# Patient Record
Sex: Male | Born: 1955 | Race: White | Hispanic: No | Marital: Married | Smoking: Never smoker
Health system: Southern US, Community
[De-identification: ages and names within clinical notes are randomized; demographics above are authoritative.]

## PROBLEM LIST (undated history)

## (undated) DIAGNOSIS — J309 Allergic rhinitis, unspecified: Secondary | ICD-10-CM

## (undated) DIAGNOSIS — M109 Gout, unspecified: Secondary | ICD-10-CM

## (undated) DIAGNOSIS — E669 Obesity, unspecified: Secondary | ICD-10-CM

## (undated) DIAGNOSIS — N289 Disorder of kidney and ureter, unspecified: Secondary | ICD-10-CM

## (undated) DIAGNOSIS — I639 Cerebral infarction, unspecified: Secondary | ICD-10-CM

## (undated) DIAGNOSIS — T783XXA Angioneurotic edema, initial encounter: Secondary | ICD-10-CM

## (undated) DIAGNOSIS — R001 Bradycardia, unspecified: Secondary | ICD-10-CM

## (undated) DIAGNOSIS — F319 Bipolar disorder, unspecified: Secondary | ICD-10-CM

## (undated) DIAGNOSIS — I10 Essential (primary) hypertension: Secondary | ICD-10-CM

## (undated) DIAGNOSIS — E119 Type 2 diabetes mellitus without complications: Secondary | ICD-10-CM

## (undated) DIAGNOSIS — L509 Urticaria, unspecified: Secondary | ICD-10-CM

## (undated) DIAGNOSIS — T1491XA Suicide attempt, initial encounter: Secondary | ICD-10-CM

## (undated) DIAGNOSIS — N419 Inflammatory disease of prostate, unspecified: Secondary | ICD-10-CM

## (undated) DIAGNOSIS — N182 Chronic kidney disease, stage 2 (mild): Secondary | ICD-10-CM

## (undated) HISTORY — DX: Allergic rhinitis, unspecified: J30.9

## (undated) HISTORY — DX: Angioneurotic edema, initial encounter: T78.3XXA

## (undated) HISTORY — PX: UMBILICAL HERNIA REPAIR: SHX196

## (undated) HISTORY — PX: TONSILLECTOMY: SUR1361

## (undated) HISTORY — DX: Inflammatory disease of prostate, unspecified: N41.9

## (undated) HISTORY — PX: APPENDECTOMY: SHX54

## (undated) HISTORY — PX: CATARACT EXTRACTION: SUR2

## (undated) HISTORY — DX: Obesity, unspecified: E66.9

## (undated) HISTORY — DX: Disorder of kidney and ureter, unspecified: N28.9

## (undated) HISTORY — DX: Urticaria, unspecified: L50.9

---

## 2003-11-25 ENCOUNTER — Emergency Department (HOSPITAL_COMMUNITY): Admission: EM | Admit: 2003-11-25 | Discharge: 2003-11-25 | Payer: Self-pay | Admitting: Emergency Medicine

## 2005-06-16 ENCOUNTER — Emergency Department (HOSPITAL_COMMUNITY): Admission: EM | Admit: 2005-06-16 | Discharge: 2005-06-16 | Payer: Self-pay | Admitting: Emergency Medicine

## 2010-11-19 DIAGNOSIS — R001 Bradycardia, unspecified: Secondary | ICD-10-CM

## 2010-11-19 HISTORY — DX: Bradycardia, unspecified: R00.1

## 2011-07-30 ENCOUNTER — Inpatient Hospital Stay (HOSPITAL_BASED_OUTPATIENT_CLINIC_OR_DEPARTMENT_OTHER)
Admission: RE | Admit: 2011-07-30 | Discharge: 2011-07-30 | Disposition: A | Discharge status: Home / Self Care | Payer: BC Managed Care – PPO | Source: Ambulatory Visit | Attending: Cardiology | Admitting: Cardiology

## 2011-07-30 DIAGNOSIS — I4949 Other premature depolarization: Secondary | ICD-10-CM | POA: Insufficient documentation

## 2011-07-30 DIAGNOSIS — F319 Bipolar disorder, unspecified: Secondary | ICD-10-CM | POA: Insufficient documentation

## 2011-07-30 DIAGNOSIS — I498 Other specified cardiac arrhythmias: Secondary | ICD-10-CM | POA: Insufficient documentation

## 2011-07-30 DIAGNOSIS — I4589 Other specified conduction disorders: Secondary | ICD-10-CM | POA: Insufficient documentation

## 2011-08-02 NOTE — Cardiovascular Report (Signed)
Edwin [name], Edwin [name]               ACCOUNT NO.:  [id number]  MEDICAL RECORD NO.:  [id number]  LOCATION:                                 FACILITY:  PHYSICIAN:  Jake Bathe, MD           DATE OF BIRTH:  DATE OF PROCEDURE:  07/30/2011 DATE OF DISCHARGE:                           CARDIAC CATHETERIZATION   INDICATIONS:  This is a 55 year old male with bipolar disorder on lithium who has had significant bradycardia, AV dissociation, ventricular ectopy/accelerated idioventricular rhythm in evening hours/nighttime hours.  After discussion with Dr. Graciela Husbands of Electrophysiology and frequent ectopic beats and prolonged QT interval, the cardiac catheterization is taking place to exclude coronary artery disease.  PROCEDURE DETAILS:  Informed consent was obtained.  Risk of stroke, heart attack, death, renal impairment, arterial damage, bleeding were explained to the patient at length.  Questions were answered and alternatives treatments were discussed, and 1% lidocaine was used for local anesthesia after visualization of the femoral head via fluoroscopy was obtained.  Using the modified Seldinger technique, a 4-French sheath was inserted in the right femoral artery without difficulty and a Judkins left #4 catheter and No Torque/Williams right catheter were used to selectively cannulate the coronary arteries and multiple views with hand injection of Omnipaque were obtained.  An angled pigtail was used to cross into the left ventricle and a left ventriculogram in the RAO position utilizing 25 mL of contrast was performed.  Following the procedure, sheath was removed.  He was hemodynamically stable, doing well.  A 2 mg of Versed and 25 mcg of fentanyl were used for conscious sedation.  FINDINGS: 1. Left main artery - short, branches into the LAD as well as     circumflex artery.  No angiographically significant disease. 2. Left anterior descending artery - this vessel gives rise to one  significant diagonal branch.  There is minor luminal irregularity     just after the bifurcation of a mid septal branch but otherwise, no     angiographically significant disease is present. 3. Circumflex artery - two significant obtuse marginal branches.  No     angiographically significant disease is present. 4. Right coronary artery - a large dominant vessel giving rise to PDA     as well as posterolateral branch.  No significant coronary artery     disease.  Left ventriculogram:  Normal left ventricular ejection fraction of 60% with no wall motion abnormality.  No significant mitral regurgitation. Ascending aorta appears normal.  HEMODYNAMICS:  The left ventricular systolic pressure 150 with an end- diastolic pressure of 30 mmHg.  Aortic pressure 150/80 with a mean of 106 mmHg.  Telemetry mostly sinus bradycardia.  Heart rate in the upper 30s noted throughout cardiac catheterization.  IMPRESSION: 1. No angiographically significant coronary artery disease. 2. Normal left ventricular ejection fraction with no mitral     regurgitation, no aortic stenosis, and ejection fraction of 60%.  PLAN:  I will discuss findings with Dr. Jennelle Human, his psychiatrist.  After discussing with Dr. Graciela Husbands of Electrophysiology, it would be advantageous to transition him off of lithium to an agent that would not affect the sinoatrial node.  I  will also pursue sleep study.  We will see him back in followup in 1 week postcatheterization.     Jake Bathe, MD     MCS/MEDQ  D:  07/30/2011  T:  07/30/2011  Job:  [id number]  cc:   Jetty Duhamel., M.D.  Electronically Signed by Donato Schultz MD on 08/02/2011 06:16:04 AM

## 2013-10-03 ENCOUNTER — Encounter (HOSPITAL_COMMUNITY): Payer: Self-pay | Admitting: Emergency Medicine

## 2013-10-03 ENCOUNTER — Emergency Department (HOSPITAL_COMMUNITY)
Admission: EM | Admit: 2013-10-03 | Discharge: 2013-10-03 | Disposition: A | Discharge status: Home / Self Care | Payer: BC Managed Care – PPO | Attending: Emergency Medicine | Admitting: Emergency Medicine

## 2013-10-03 DIAGNOSIS — F319 Bipolar disorder, unspecified: Secondary | ICD-10-CM | POA: Insufficient documentation

## 2013-10-03 DIAGNOSIS — Y9389 Activity, other specified: Secondary | ICD-10-CM | POA: Insufficient documentation

## 2013-10-03 DIAGNOSIS — W64XXXA Exposure to other animate mechanical forces, initial encounter: Secondary | ICD-10-CM | POA: Insufficient documentation

## 2013-10-03 DIAGNOSIS — S0081XA Abrasion of other part of head, initial encounter: Secondary | ICD-10-CM

## 2013-10-03 DIAGNOSIS — I10 Essential (primary) hypertension: Secondary | ICD-10-CM | POA: Insufficient documentation

## 2013-10-03 DIAGNOSIS — M109 Gout, unspecified: Secondary | ICD-10-CM | POA: Insufficient documentation

## 2013-10-03 DIAGNOSIS — Y92410 Unspecified street and highway as the place of occurrence of the external cause: Secondary | ICD-10-CM | POA: Insufficient documentation

## 2013-10-03 DIAGNOSIS — Z23 Encounter for immunization: Secondary | ICD-10-CM | POA: Insufficient documentation

## 2013-10-03 DIAGNOSIS — IMO0002 Reserved for concepts with insufficient information to code with codable children: Secondary | ICD-10-CM | POA: Insufficient documentation

## 2013-10-03 HISTORY — DX: Gout, unspecified: M10.9

## 2013-10-03 HISTORY — DX: Bipolar disorder, unspecified: F31.9

## 2013-10-03 HISTORY — DX: Essential (primary) hypertension: I10

## 2013-10-03 MED ORDER — TETANUS-DIPHTH-ACELL PERTUSSIS 5-2.5-18.5 LF-MCG/0.5 IM SUSP
0.5000 mL | Freq: Once | INTRAMUSCULAR | Status: AC
  Administered 2013-10-03: 0.5 mL via INTRAMUSCULAR
  Filled 2013-10-03: qty 0.5

## 2013-10-03 NOTE — ED Provider Notes (Signed)
Medical screening examination/treatment/procedure(s) were performed by non-physician practitioner and as supervising physician I was immediately available for consultation/collaboration.    Celene Kras, MD 10/03/13 [phone]
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
## 2013-11-04 ENCOUNTER — Ambulatory Visit: Payer: BC Managed Care – PPO | Admitting: Cardiology

## 2013-11-15 ENCOUNTER — Encounter: Payer: Self-pay | Admitting: Cardiology

## 2013-11-23 ENCOUNTER — Encounter: Payer: Self-pay | Admitting: Cardiology

## 2013-11-23 ENCOUNTER — Ambulatory Visit (INDEPENDENT_AMBULATORY_CARE_PROVIDER_SITE_OTHER): Payer: BC Managed Care – PPO | Admitting: Cardiology

## 2013-11-23 VITALS — BP 124/86 | HR 48 | Ht 71.0 in | Wt 193.0 lb

## 2013-11-23 DIAGNOSIS — I10 Essential (primary) hypertension: Secondary | ICD-10-CM | POA: Insufficient documentation

## 2013-11-23 DIAGNOSIS — I4589 Other specified conduction disorders: Secondary | ICD-10-CM

## 2013-11-23 NOTE — Patient Instructions (Addendum)
Your physician recommends that you continue on your current medications as directed. Please refer to the Current Medication list given to you today.  Your physician wants you to follow-up in: 1 year with Dr. Skains. You will receive a reminder letter in the mail two months in advance. If you don't receive a letter, please call our office to schedule the follow-up appointment.  

## 2013-11-23 NOTE — Progress Notes (Signed)
[address] [place], [place]  [address] Phone: [phone] Fax:  [phone]  Date:  11/23/2013   ID:  Edwin [name], DOB 1956/01/07, MRN [id number]  PCP:  [name], MD   History of Present Illness: Edwin [name] is a 58 y.o. 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 Diltiazem, which was used for high blood pressure, was discontinued and he continued to have slow heart rates. A cardiac catheterization was performed in 2012 to exclude ischemic origin for his A-V dissociation. This was reassuring. EF was normal.  Currently, he is feeling very well on Depakote. He feels more spontaneous. He feels better than he did on the lithium. I'm very pleased with this. Dr. Jennelle Human must be also.  He's not having any dizziness, syncope. His heart rate today is in the 50s to 70s.   blood pressures overall well-controlled.  Wt Readings from Last 3 Encounters:  11/23/13 193 lb (87.544 kg)  10/03/13 191 lb (86.637 kg)     Past Medical History  Diagnosis Date  . Hypertension   . Bipolar disorder   . Gout   . Allergic rhinitis   . Mild renal insufficiency     , with creatinine of 1.3 in 2010  . Obesity     ,moderate  . Prostatitis     , Episodic  . Internal hemorrhoid     Past Surgical History  Procedure Laterality Date  . Appendectomy    . Tonsillectomy    . Abdominal surgery    . Umbilical hernia repair    . Cataract extraction Right     Current Outpatient Prescriptions  Medication Sig Dispense Refill  . amLODipine (NORVASC) 5 MG tablet Take 5 mg by mouth daily.       . Cholecalciferol (VITAMIN D3) 2000 UNITS TABS Take 1 tablet by mouth daily.      [name] COLCRYS 0.6 MG tablet Take 0.6 mg by mouth 2 (two) times daily.       . divalproex (DEPAKOTE ER) 500 MG 24 hr tablet Take 1,000 mg by mouth at bedtime.       . DULoxetine (CYMBALTA) 60 MG capsule Take 60 mg by mouth 2 (two)  times daily.       . indomethacin (INDOCIN) 50 MG capsule Take 50 mg by mouth as needed.      . lamoTRIgine (LAMICTAL) 150 MG tablet Take 150 mg by mouth daily.       . LevOCARNitine (L-CARNITINE) 250 MG TABS Take 2 tablets by mouth 2 (two) times daily.      [name] losartan (COZAAR) 100 MG tablet Take 100 mg by mouth daily.       [name] ULORIC 80 MG TABS Take 80 mg by mouth daily.        No current facility-administered medications for this visit.    Allergies:    Allergies  Allergen Reactions  . Allopurinol Other (See Comments)    emotionally labile  . Penicillins Hives  . Ciprofloxacin Rash    Social History:  The patient  reports that he has never smoked. He does not have any smokeless tobacco history on file. He reports that he does not drink alcohol or use illicit drugs.   ROS:  Please see the history of present illness.   No syncope, no bleeding, no orthopnea, no PND    PHYSICAL EXAM: VS:  BP 124/86  Pulse 48  Ht 5\' 11"  (1.803 m)  Wt 193 lb (87.544 kg)  BMI 26.93 kg/m2 Well nourished, well developed, in no acute distress HEENT: normal Neck: no JVD Cardiac:  normal S1, S2; RRR/sinus bradycardia; no murmur Lungs:  clear to auscultation bilaterally, no wheezing, rhonchi or rales Abd: soft, nontender, no hepatomegaly Ext: no edema Skin: warm and dry Neuro: no focal abnormalities noted  EKG:  Sinus bradycardia with sinus arrhythmia, heart rate 48 beats per minute. Possible old septal infarct pattern.     ASSESSMENT AND PLAN:  1. Previous A-V dissociation. Doing well. Asymptomatic bradycardia. No changes made in medications. He is feeling much better. Unfortunately his mother in her 5480s has had a stroke and she will be placed on hospice soon. 2. Hypertension-currently well controlled. Amlodipine, Losartan.  Signed, Donato SchultzMark Zaydenn Balaguer, MD [facility]  11/23/2013 11:57 AM

## 2014-03-29 ENCOUNTER — Other Ambulatory Visit: Payer: Self-pay | Admitting: Internal Medicine

## 2014-03-29 DIAGNOSIS — R1030 Lower abdominal pain, unspecified: Secondary | ICD-10-CM

## 2014-03-30 ENCOUNTER — Ambulatory Visit
Admission: RE | Admit: 2014-03-30 | Discharge: 2014-03-30 | Disposition: A | Discharge status: Home / Self Care | Payer: BC Managed Care – PPO | Source: Ambulatory Visit | Attending: Internal Medicine | Admitting: Internal Medicine

## 2014-03-30 DIAGNOSIS — R1030 Lower abdominal pain, unspecified: Secondary | ICD-10-CM

## 2014-03-30 IMAGING — CT CT ABD-PELV W/ CM
2 of 5 series · 16 of 46 positions shown, 18 images · IV contrast (READICAT/WATER & [ID] OMNI 300)
Comparison: None.

ADDENDUM:
The distal -terminal ileum does appear to be decompressed.
CLINICAL DATA: Lower abdomen pain

EXAM:
CT ABDOMEN AND PELVIS WITH CONTRAST
TECHNIQUE: Multidetector CT imaging of the abdomen and pelvis was performed
using the standard protocol following bolus administration of
intravenous contrast.
CONTRAST:  100mL OMNIPAQUE IOHEXOL 300 MG/ML  SOLN

[Series 2: abd/pelvis with · axial · 0.74mm/px · z∈[-397,+23]mm · 13 of 95 slices shown, 15 images]
[im 6/95  soft-tissue]
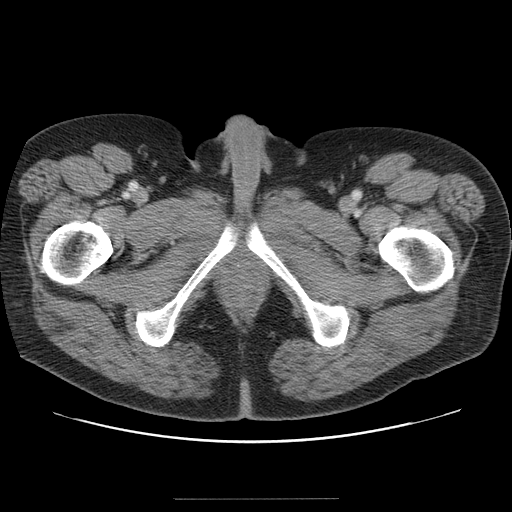
[im 6/95  bone]
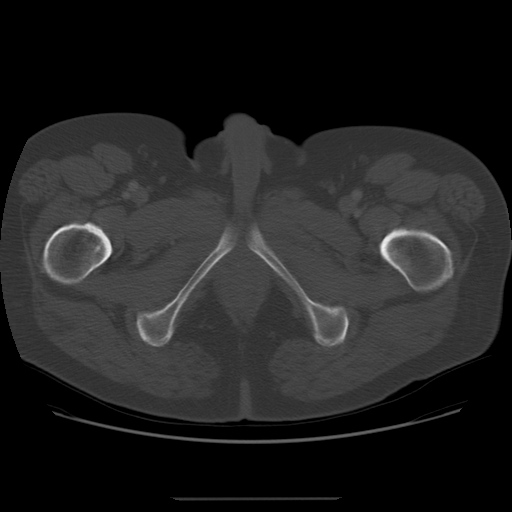
[im 11/95  soft-tissue]
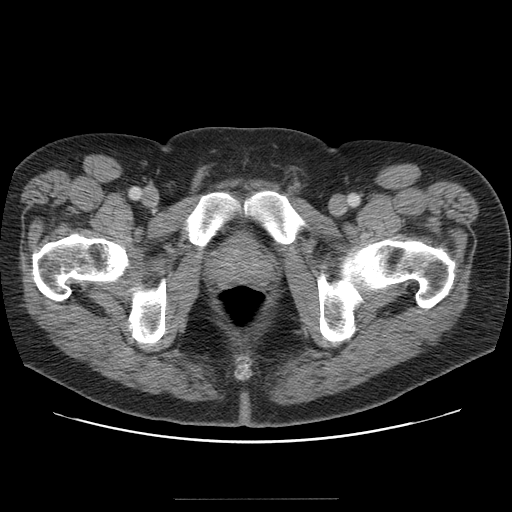
[im 21/95  soft-tissue]
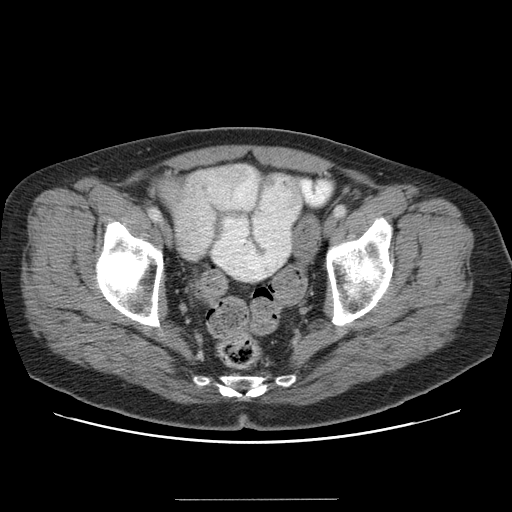
[im 27/95  soft-tissue]
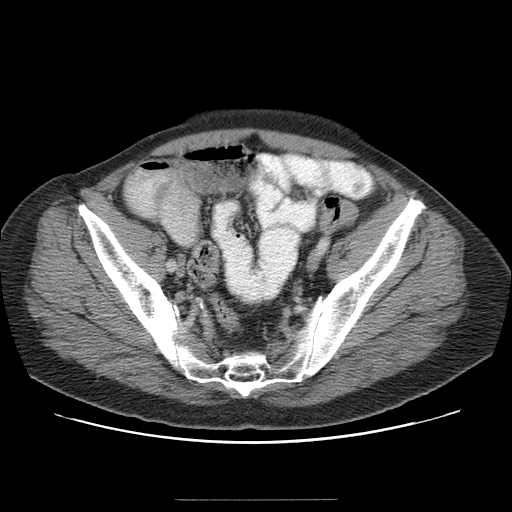
[im 32/95  soft-tissue]
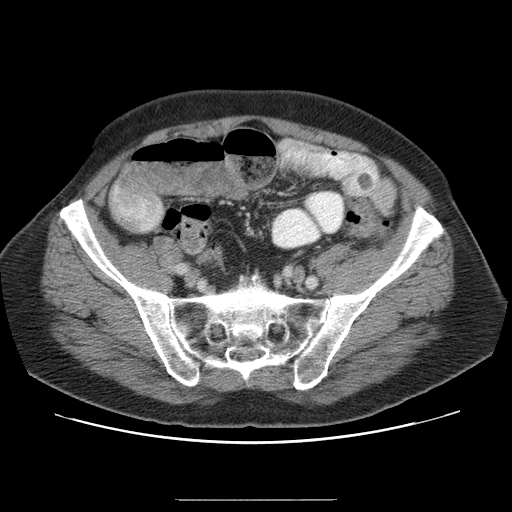
[im 42/95  soft-tissue]
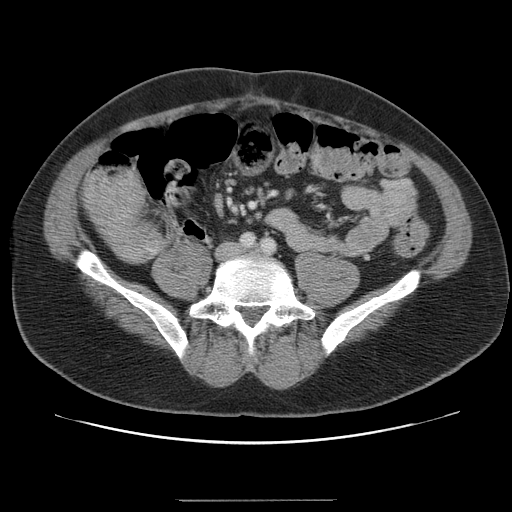
[im 48/95  soft-tissue]
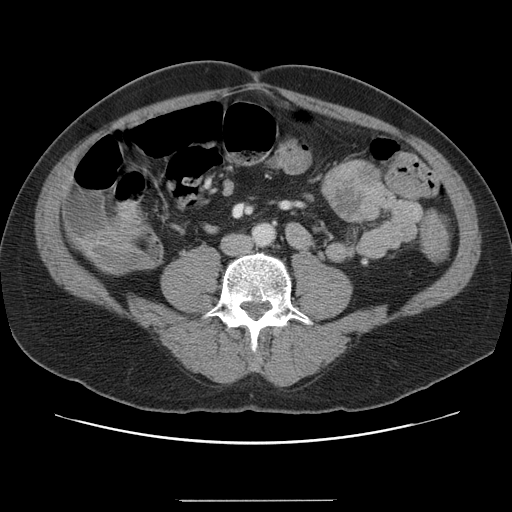
[im 53/95  soft-tissue]
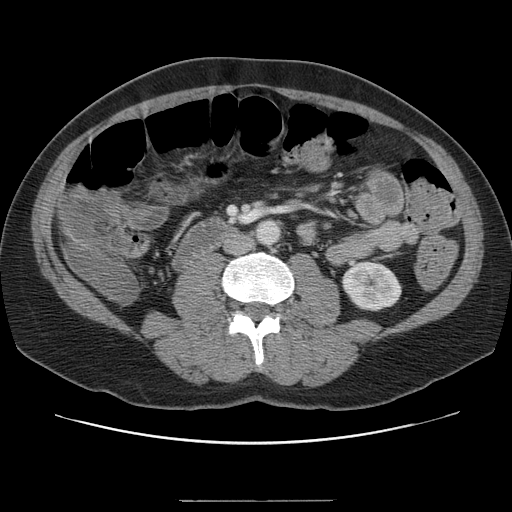
[im 63/95  soft-tissue]
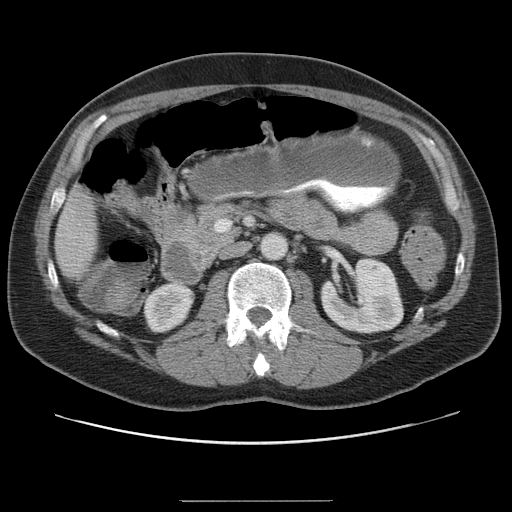
[im 63/95  bone]
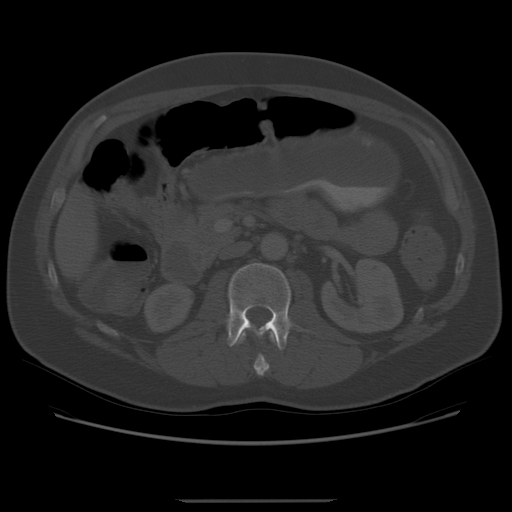
[im 68/95  soft-tissue]
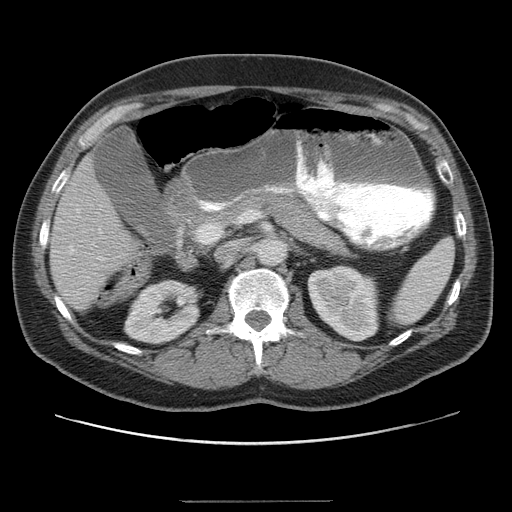
[im 74/95  soft-tissue]
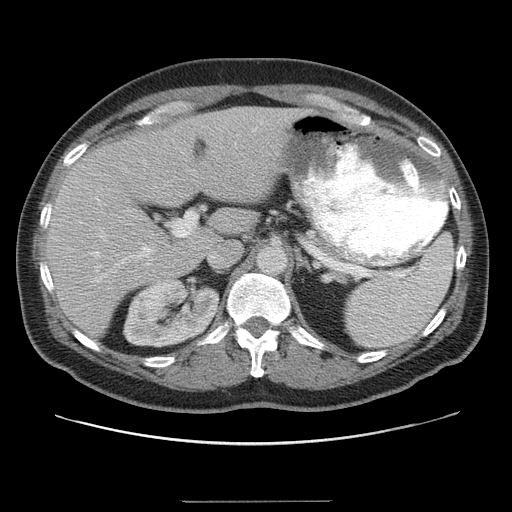
[im 84/95  soft-tissue]
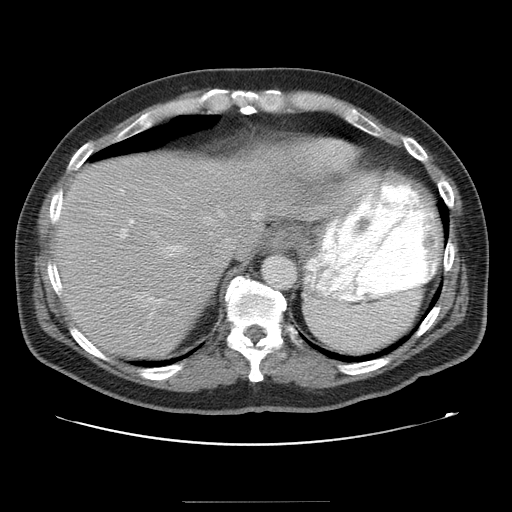
[im 89/95  soft-tissue]
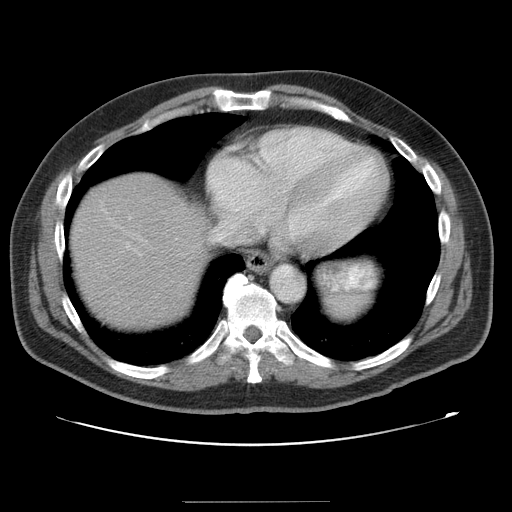

[Series 400: cor · coronal · 0.99mm/px · 3 of 147 slices shown]
[im 49/147  soft-tissue]
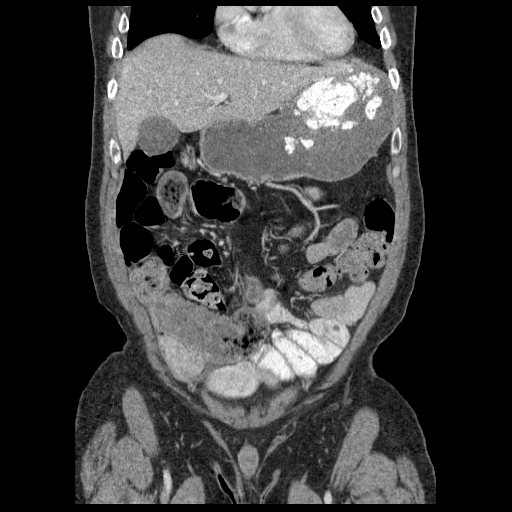
[im 65/147  soft-tissue]
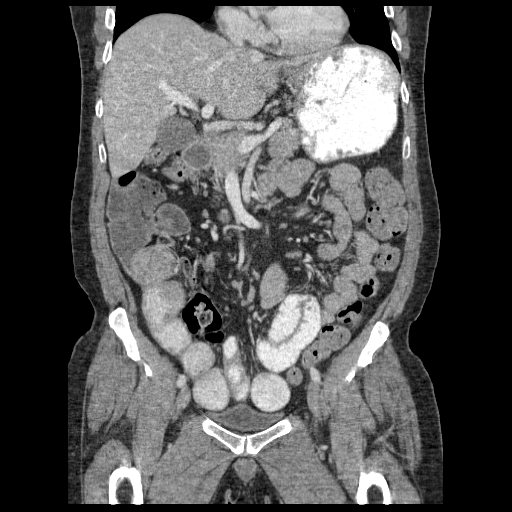
[im 82/147  soft-tissue]
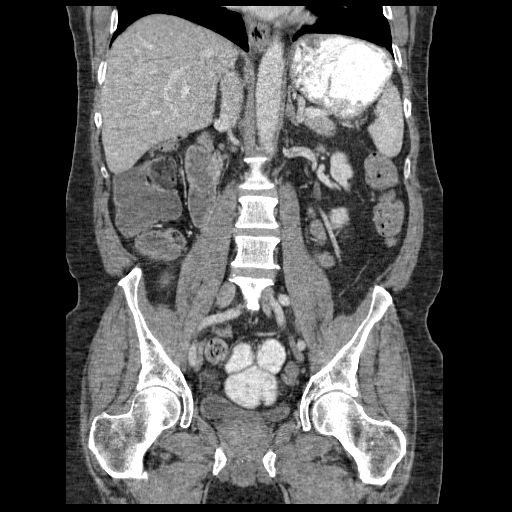

[16 of 46 positions shown; findings below may reference images not displayed]

FINDINGS: The lung bases are clear. The liver enhances with no focal
abnormality and no ductal dilatation is seen. No calcified
gallstones are noted. The pancreas is normal in size and the
pancreatic duct is not dilated. The adrenal glands and spleen are
unremarkable. The stomach is moderately distended with contrast
material and is unremarkable. The kidneys enhance with no calculus
or mass, with a small cyst noted in the posterior left mid kidney.
On delayed images, the pelvocaliceal systems are unremarkable. The
proximal ureters are normal in caliber. The abdominal aorta is
normal caliber with mild atheromatous change present. Only a few
small retroperitoneal nodes are present.

The urinary bladder is decompressed and cannot be evaluated. The
prostate is normal in size. Feces and fluid is noted throughout the
colon. The distal and terminal ileum appears dilated containing
fluid and food debris. This appearance can be seen with partial
small bowel obstruction, and there does appear to be some change in
caliber of small bowel with the more proximal small bowel normal in
caliber and small bowel within the pelvis and right lower quadrant
being dilated. No definite point of obstruction is seen, but an
adhesion would be the primary consideration creating a partial small
bowel obstruction. No free fluid is seen within the pelvis. The
lumbar vertebrae are normal alignment.
IMPRESSION: There does appear to be a change in caliber of the small bowel in
the pelvis where dilated small bowel is present with fecalization.
No definite point of obstruction is seen but adhesion is favored in
this apparent partial small bowel obstruction.

## 2014-03-30 MED ORDER — IOHEXOL 300 MG/ML  SOLN
100.0000 mL | Freq: Once | INTRAMUSCULAR | Status: AC | PRN
  Administered 2014-03-30: 100 mL via INTRAVENOUS

## 2014-03-31 ENCOUNTER — Emergency Department (HOSPITAL_COMMUNITY)
Admission: EM | Admit: 2014-03-31 | Discharge: 2014-03-31 | Disposition: A | Discharge status: Home / Self Care | Payer: BC Managed Care – PPO | Attending: Emergency Medicine | Admitting: Emergency Medicine

## 2014-03-31 ENCOUNTER — Encounter (HOSPITAL_COMMUNITY): Payer: Self-pay | Admitting: Emergency Medicine

## 2014-03-31 DIAGNOSIS — Z87448 Personal history of other diseases of urinary system: Secondary | ICD-10-CM | POA: Insufficient documentation

## 2014-03-31 DIAGNOSIS — E669 Obesity, unspecified: Secondary | ICD-10-CM | POA: Insufficient documentation

## 2014-03-31 DIAGNOSIS — Z79899 Other long term (current) drug therapy: Secondary | ICD-10-CM | POA: Insufficient documentation

## 2014-03-31 DIAGNOSIS — R109 Unspecified abdominal pain: Secondary | ICD-10-CM

## 2014-03-31 DIAGNOSIS — M109 Gout, unspecified: Secondary | ICD-10-CM | POA: Insufficient documentation

## 2014-03-31 DIAGNOSIS — Z9889 Other specified postprocedural states: Secondary | ICD-10-CM | POA: Insufficient documentation

## 2014-03-31 DIAGNOSIS — F319 Bipolar disorder, unspecified: Secondary | ICD-10-CM | POA: Insufficient documentation

## 2014-03-31 DIAGNOSIS — Z880 Allergy status to penicillin: Secondary | ICD-10-CM | POA: Insufficient documentation

## 2014-03-31 DIAGNOSIS — I10 Essential (primary) hypertension: Secondary | ICD-10-CM | POA: Insufficient documentation

## 2014-03-31 DIAGNOSIS — R1084 Generalized abdominal pain: Secondary | ICD-10-CM | POA: Insufficient documentation

## 2014-03-31 DIAGNOSIS — R110 Nausea: Secondary | ICD-10-CM | POA: Insufficient documentation

## 2014-03-31 DIAGNOSIS — Z9089 Acquired absence of other organs: Secondary | ICD-10-CM | POA: Insufficient documentation

## 2014-03-31 NOTE — ED Notes (Signed)
Pt from home, sent for eval of bowel obstruction. PT had CT yesterday at [facility].Sent here this morning by Dr. Eula Listen. Pt states some abd discomfort, last BM 4-5 days ago.

## 2014-04-06 NOTE — ED Provider Notes (Signed)
CSN: [id number]     Arrival date & time 03/31/14  [id number] History   First MD Initiated Contact with Patient 03/31/14 0940     Chief Complaint  Patient presents with  . Abdominal Pain     (Consider location/radiation/quality/duration/timing/severity/associated sxs/prior Treatment) HPI  57yM with abdominal pain and nausea. Onset approximately 4-5 days ago. Diffuse crampy abdominal pain. Associated with nausea. No fevers or chills. No urinary complaints. Patient is CT prior to arrival in the emergency room for further evaluation of symptoms. He was significant for a partial small bowel obstruction. Patient reports that his symptoms have actually been improved today. He has been tolerating liquids.  Past Medical History  Diagnosis Date  . Hypertension   . Bipolar disorder   . Gout   . Allergic rhinitis   . Mild renal insufficiency     , with creatinine of 1.3 in 2010  . Obesity     ,moderate  . Prostatitis     , Episodic  . Internal hemorrhoid    Past Surgical History  Procedure Laterality Date  . Appendectomy    . Tonsillectomy    . Abdominal surgery    . Umbilical hernia repair    . Cataract extraction Right    Family History  Problem Relation Age of Onset  . Other Mother     Viral Meningitis  . Mitral valve prolapse Mother   . Arrhythmia Mother   . Hypothyroidism Mother   . Hypertension Father   . Gout Father   . Alzheimer's disease Father    History  Substance Use Topics  . Smoking status: Never Smoker   . Smokeless tobacco: Not on file  . Alcohol Use: No    Review of Systems   All systems reviewed and negative, other than as noted in HPI.  Allergies  Allopurinol; Penicillins; and Ciprofloxacin  Home Medications   Prior to Admission medications   Medication Sig Start Date End Date Taking? Authorizing Provider  amLODipine (NORVASC) 5 MG tablet Take 2.5 mg by mouth daily.  08/18/13  Yes Historical Provider, MD  Cholecalciferol (VITAMIN D3) 2000 UNITS TABS  Take 2,000 Units by mouth daily.    Yes Historical Provider, MD  COLCRYS 0.6 MG tablet Take 0.6 mg by mouth daily.  09/16/13  Yes Historical Provider, MD  divalproex (DEPAKOTE ER) 500 MG 24 hr tablet Take 1,000 mg by mouth at bedtime.  09/16/13  Yes Historical Provider, MD  DULoxetine (CYMBALTA) 60 MG capsule Take 120 mg by mouth at bedtime.  09/16/13  Yes Historical Provider, MD  lamoTRIgine (LAMICTAL) 150 MG tablet Take 150 mg by mouth daily.  09/16/13  Yes Historical Provider, MD  LevOCARNitine (L-CARNITINE) 250 MG TABS Take 500 mg by mouth 2 (two) times daily.    Yes Historical Provider, MD  losartan (COZAAR) 100 MG tablet Take 50 mg by mouth daily.  09/16/13  Yes Historical Provider, MD  ULORIC 80 MG TABS Take 40 mg by mouth daily.  09/02/13  Yes Historical Provider, MD   BP 130/78  Pulse 60  Temp(Src) 98 F (36.7 C) (Oral)  Resp 14  SpO2 96% Physical Exam  Nursing note and vitals reviewed. Constitutional: He appears well-developed and well-nourished. No distress.  HENT:  Head: Normocephalic and atraumatic.  Eyes: Conjunctivae are normal. Right eye exhibits no discharge. Left eye exhibits no discharge.  Neck: Neck supple.  Cardiovascular: Normal rate, regular rhythm and normal heart sounds.  Exam reveals no gallop and no friction rub.  No murmur heard. Pulmonary/Chest: Effort normal and breath sounds normal. No respiratory distress.  Abdominal: Soft. He exhibits no distension. There is no tenderness. There is no rebound and no guarding.  Musculoskeletal: He exhibits no edema and no tenderness.  Neurological: He is alert.  Skin: Skin is warm and dry.  Psychiatric: He has a normal mood and affect. His behavior is normal. Thought content normal.    ED Course  Procedures (including critical care time) Labs Review Labs Reviewed - No data to display  Imaging Review No results found.  Ct Abdomen Pelvis W Contrast  03/31/2014   ADDENDUM REPORT: 03/31/2014 07:59  ADDENDUM: The  distal -terminal ileum does appear to be decompressed.   Electronically Signed   By: Dwyane Dee M.D.   On: 03/31/2014 07:59   03/31/2014   CLINICAL DATA:  Lower abdomen pain  EXAM: CT ABDOMEN AND PELVIS WITH CONTRAST  TECHNIQUE: Multidetector CT imaging of the abdomen and pelvis was performed using the standard protocol following bolus administration of intravenous contrast.  CONTRAST:  OMNIPAQUE IOHEXOL 300 MG/ML  SOLN  COMPARISON:  None.  FINDINGS: The lung bases are clear. The liver enhances with no focal abnormality and no ductal dilatation is seen. No calcified gallstones are noted. The pancreas is normal in size and the pancreatic duct is not dilated. The adrenal glands and spleen are unremarkable. The stomach is moderately distended with contrast material and is unremarkable. The kidneys enhance with no calculus or mass, with a small cyst noted in the posterior left mid kidney. On delayed images, the pelvocaliceal systems are unremarkable. The proximal ureters are normal in caliber. The abdominal aorta is normal caliber with mild atheromatous change present. Only a few small retroperitoneal nodes are present.  The urinary bladder is decompressed and cannot be evaluated. The prostate is normal in size. Feces and fluid is noted throughout the colon. The distal and terminal ileum appears dilated containing fluid and food debris. This appearance can be seen with partial small bowel obstruction, and there does appear to be some change in caliber of small bowel with the more proximal small bowel normal in caliber and small bowel within the pelvis and right lower quadrant being dilated. No definite point of obstruction is seen, but an adhesion would be the primary consideration creating a partial small bowel obstruction. No free fluid is seen within the pelvis. The lumbar vertebrae are normal alignment.  IMPRESSION: There does appear to be a change in caliber of the small bowel in the pelvis where dilated  small bowel is present with fecalization. No definite point of obstruction is seen but adhesion is favored in this apparent partial small bowel obstruction.  Electronically Signed: By: Dwyane Dee M.D. On: 03/30/2014 17:22    EKG Interpretation None      MDM   Final diagnoses:  Abdominal pain    57yM with imaging significant for possible partial SBO. Clinically though, pt feeling better. Abd soft and NT. No vomiting. Nausea and pain mild. Declining offered pain medication/antiemetic. Tolerating liquids. Slowly advance as diet as tolerated. Return precautions discussed.     Raeford Razor, MD 04/09/14 1043

## 2014-05-19 ENCOUNTER — Other Ambulatory Visit: Payer: Self-pay | Admitting: Gastroenterology

## 2014-06-04 ENCOUNTER — Encounter (HOSPITAL_COMMUNITY): Payer: Self-pay | Admitting: *Deleted

## 2014-06-14 ENCOUNTER — Encounter (HOSPITAL_COMMUNITY)
Admission: RE | Disposition: A | Discharge status: Home / Self Care | Payer: Self-pay | Source: Ambulatory Visit | Attending: Gastroenterology

## 2014-06-14 ENCOUNTER — Ambulatory Visit (HOSPITAL_COMMUNITY)
Admission: RE | Admit: 2014-06-14 | Discharge: 2014-06-14 | Disposition: A | Discharge status: Home / Self Care | Payer: BC Managed Care – PPO | Source: Ambulatory Visit | Attending: Gastroenterology | Admitting: Gastroenterology

## 2014-06-14 ENCOUNTER — Encounter (HOSPITAL_COMMUNITY): Payer: Self-pay

## 2014-06-14 ENCOUNTER — Ambulatory Visit (HOSPITAL_COMMUNITY): Payer: BC Managed Care – PPO | Admitting: Anesthesiology

## 2014-06-14 ENCOUNTER — Encounter (HOSPITAL_COMMUNITY): Payer: BC Managed Care – PPO | Admitting: Anesthesiology

## 2014-06-14 DIAGNOSIS — Z79899 Other long term (current) drug therapy: Secondary | ICD-10-CM | POA: Insufficient documentation

## 2014-06-14 DIAGNOSIS — R109 Unspecified abdominal pain: Secondary | ICD-10-CM | POA: Insufficient documentation

## 2014-06-14 DIAGNOSIS — Q438 Other specified congenital malformations of intestine: Secondary | ICD-10-CM | POA: Insufficient documentation

## 2014-06-14 DIAGNOSIS — I10 Essential (primary) hypertension: Secondary | ICD-10-CM | POA: Insufficient documentation

## 2014-06-14 DIAGNOSIS — F319 Bipolar disorder, unspecified: Secondary | ICD-10-CM | POA: Insufficient documentation

## 2014-06-14 DIAGNOSIS — Z538 Procedure and treatment not carried out for other reasons: Secondary | ICD-10-CM | POA: Insufficient documentation

## 2014-06-14 DIAGNOSIS — M109 Gout, unspecified: Secondary | ICD-10-CM | POA: Insufficient documentation

## 2014-06-14 DIAGNOSIS — Z9089 Acquired absence of other organs: Secondary | ICD-10-CM | POA: Insufficient documentation

## 2014-06-14 HISTORY — PX: COLONOSCOPY WITH PROPOFOL: SHX5780

## 2014-06-14 HISTORY — DX: Bradycardia, unspecified: R00.1

## 2014-06-14 SURGERY — COLONOSCOPY WITH PROPOFOL
Anesthesia: Monitor Anesthesia Care

## 2014-06-14 MED ORDER — LACTATED RINGERS IV SOLN
INTRAVENOUS | Status: DC
  Administered 2014-06-14: 11:00:00 via INTRAVENOUS

## 2014-06-14 MED ORDER — MIDAZOLAM HCL 5 MG/5ML IJ SOLN
INTRAMUSCULAR | Status: DC | PRN
  Administered 2014-06-14 (×2): 1 mg via INTRAVENOUS

## 2014-06-14 MED ORDER — PROPOFOL 10 MG/ML IV BOLUS
INTRAVENOUS | Status: DC | PRN
  Administered 2014-06-14 (×2): 20 mg via INTRAVENOUS
  Administered 2014-06-14 (×2): 40 mg via INTRAVENOUS
  Administered 2014-06-14: 20 mg via INTRAVENOUS

## 2014-06-14 MED ORDER — FENTANYL CITRATE 0.05 MG/ML IJ SOLN
INTRAMUSCULAR | Status: DC | PRN
  Administered 2014-06-14 (×2): 50 ug via INTRAVENOUS

## 2014-06-14 MED ORDER — MIDAZOLAM HCL 2 MG/2ML IJ SOLN
INTRAMUSCULAR | Status: AC
  Filled 2014-06-14: qty 2

## 2014-06-14 MED ORDER — SODIUM CHLORIDE 0.9 % IV SOLN: INTRAVENOUS | Status: DC

## 2014-06-14 MED ORDER — FENTANYL CITRATE 0.05 MG/ML IJ SOLN
INTRAMUSCULAR | Status: AC
  Filled 2014-06-14: qty 2

## 2014-06-14 SURGICAL SUPPLY — 22 items

## 2014-06-14 NOTE — H&P (Signed)
  Problem: Abdominal pain with constipation. Resolved partial small bowel obstruction  History: The patient is a 58 year old male born [birth year]. He underwent an appendectomy in the past but has not undergone a screening colonoscopy in the past.  Approximately 6 weeks ago, the patient was awakened at night with acute nausea, vomiting, abdominal cramps, nonbloody diarrhea. CT scan of the abdomen and pelvis was performed on 03/30/2014 showing mild dilation of the distal-terminal ileum consistent with a partial small bowel obstruction. His gastrointestinal symptoms resolved within 48 hours. He continues to have. Mild discomfort in the lower mid abdomen.  The patient is scheduled to undergo diagnostic colonoscopy.  Past medical history: Gout. Hypertension. Bipolar disorder. Allergic rhinitis. Appendectomy. Right cataract surgery. Umbilical hernia repair.  Allergies: Allopurinol. Penicillin. Ciprofloxacin.  Exam: The patient is alert and lying comfortably on the endoscopy stretcher. Abdomen is soft and nontender to palpation. Lungs are clear to auscultation. Cardiac exam reveals a regular rhythm.  Plan: Proceed with diagnostic colonoscopy

## 2014-06-14 NOTE — Discharge Instructions (Addendum)

## 2014-06-14 NOTE — Op Note (Signed)
Procedure: Incomplete screening colonoscopy to the hepatic flexure  Endoscopist: Danise EdgeMartin Johnson  Premedication: Propofol administered by anesthesia  Procedure: The patient was placed in the left lateral decubitus position. Anal inspection and digital rectal exam were normal. The Pentax pediatric colonoscope was introduced into the rectum and advanced to the hepatic flexure. Due to colonic loop formation I was unable to perform a complete screening colonoscopy. Colon preparation for the exam today was good.  Rectum. Normal. Retroflex view of the distal rectum normal  Sigmoid colon and descending colon. Normal  Splenic flexure. Normal  Transverse colon. Normal  Hepatic flexure. Normal  Ascending colon, cecum, and ileocecal valve. Not examined  Assessment: Normal incomplete screening colonoscopy to the hepatic flexure. The right colon was not exam  Recommendation: Schedule virtual colonoscopy

## 2014-06-14 NOTE — Transfer of Care (Signed)
Immediate Anesthesia Transfer of Care Note  Patient: Edwin [name]  Procedure(s) Performed: Procedure(s): COLONOSCOPY WITH PROPOFOL (N/A)  Patient Location: PACU  Anesthesia Type:MAC  Level of Consciousness: awake, oriented, patient cooperative, lethargic and responds to stimulation  Airway & Oxygen Therapy: Patient Spontanous Breathing and Patient connected to face mask oxygen  Post-op Assessment: Report given to PACU RN, Post -op Vital signs reviewed and stable and Patient moving all extremities  Post vital signs: Reviewed and stable  Complications: No apparent anesthesia complications

## 2014-06-14 NOTE — Anesthesia Preprocedure Evaluation (Addendum)
Anesthesia Evaluation  Patient identified by MRN, date of birth, ID band Patient awake    Reviewed: Allergy & Precautions, H&P , NPO status , Patient's Chart, lab work & pertinent test results  Airway Mallampati: II TM Distance: >3 FB Neck ROM: Full    Dental  (+) Dental Advisory Given   Pulmonary neg pulmonary ROS,  breath sounds clear to auscultation        Cardiovascular hypertension, Pt. on medications Rhythm:Regular Rate:Normal     Neuro/Psych PSYCHIATRIC DISORDERS Bipolar Disorder negative neurological ROS     GI/Hepatic negative GI ROS, Neg liver ROS,   Endo/Other  negative endocrine ROS  Renal/GU Renal InsufficiencyRenal disease     Musculoskeletal negative musculoskeletal ROS (+)   Abdominal   Peds  Hematology negative hematology ROS (+)   Anesthesia Other Findings   Reproductive/Obstetrics                          Anesthesia Physical Anesthesia Plan  ASA: II  Anesthesia Plan: MAC   Post-op Pain Management:    Induction: Intravenous  Airway Management Planned:   Additional Equipment:   Intra-op Plan:   Post-operative Plan:   Informed Consent: I have reviewed the patients History and Physical, chart, labs and discussed the procedure including the risks, benefits and alternatives for the proposed anesthesia with the patient or authorized representative who has indicated his/her understanding and acceptance.   Dental advisory given  Plan Discussed with: CRNA  Anesthesia Plan Comments:         Anesthesia Quick Evaluation

## 2014-06-14 NOTE — Anesthesia Postprocedure Evaluation (Signed)
Anesthesia Post Note  Patient: Edwin [name]  Procedure(s) Performed: Procedure(s) (LRB): COLONOSCOPY WITH PROPOFOL (N/A)  Anesthesia type: MAC  Patient location: PACU  Post pain: Pain level controlled  Post assessment: Post-op Vital signs reviewed  Last Vitals: BP 137/86  Pulse 60  Temp(Src) 36.4 C (Oral)  Resp 12  SpO2 100%  Post vital signs: Reviewed  Level of consciousness: awake  Complications: No apparent anesthesia complications

## 2014-06-15 ENCOUNTER — Encounter (HOSPITAL_COMMUNITY): Payer: Self-pay | Admitting: Gastroenterology

## 2014-09-19 ENCOUNTER — Encounter (HOSPITAL_COMMUNITY): Payer: Self-pay | Admitting: Emergency Medicine

## 2014-09-19 ENCOUNTER — Emergency Department (HOSPITAL_COMMUNITY)
Admission: EM | Admit: 2014-09-19 | Discharge: 2014-09-20 | Disposition: A | Discharge status: Other Acute Inpatient Hospital | Payer: BC Managed Care – PPO | Attending: Emergency Medicine | Admitting: Emergency Medicine

## 2014-09-19 DIAGNOSIS — Z79899 Other long term (current) drug therapy: Secondary | ICD-10-CM | POA: Diagnosis not present

## 2014-09-19 DIAGNOSIS — R45851 Suicidal ideations: Secondary | ICD-10-CM | POA: Diagnosis not present

## 2014-09-19 DIAGNOSIS — T1491XA Suicide attempt, initial encounter: Secondary | ICD-10-CM | POA: Diagnosis present

## 2014-09-19 DIAGNOSIS — E669 Obesity, unspecified: Secondary | ICD-10-CM | POA: Insufficient documentation

## 2014-09-19 DIAGNOSIS — Z8739 Personal history of other diseases of the musculoskeletal system and connective tissue: Secondary | ICD-10-CM | POA: Diagnosis not present

## 2014-09-19 DIAGNOSIS — F314 Bipolar disorder, current episode depressed, severe, without psychotic features: Secondary | ICD-10-CM

## 2014-09-19 DIAGNOSIS — Z8709 Personal history of other diseases of the respiratory system: Secondary | ICD-10-CM | POA: Insufficient documentation

## 2014-09-19 DIAGNOSIS — Z87448 Personal history of other diseases of urinary system: Secondary | ICD-10-CM | POA: Diagnosis not present

## 2014-09-19 DIAGNOSIS — Z880 Allergy status to penicillin: Secondary | ICD-10-CM | POA: Diagnosis not present

## 2014-09-19 DIAGNOSIS — F3132 Bipolar disorder, current episode depressed, moderate: Secondary | ICD-10-CM | POA: Diagnosis present

## 2014-09-19 DIAGNOSIS — I10 Essential (primary) hypertension: Secondary | ICD-10-CM | POA: Diagnosis not present

## 2014-09-19 LAB — CARBOXYHEMOGLOBIN
Carboxyhemoglobin: 1.5 % (ref 0.5–1.5)
Methemoglobin: 2.7 % — ABNORMAL HIGH (ref 0.0–1.5)
O2 Saturation: 49.9 %
Total hemoglobin: 16 g/dL (ref 13.5–18.0)

## 2014-09-19 LAB — CBC WITH DIFFERENTIAL/PLATELET
Basophils Absolute: 0.1 10*3/uL (ref 0.0–0.1)
Basophils Relative: 1 % (ref 0–1)
Eosinophils Absolute: 0.1 10*3/uL (ref 0.0–0.7)
Eosinophils Relative: 1 % (ref 0–5)
HCT: 42.4 % (ref 39.0–52.0)
Hemoglobin: 15.1 g/dL (ref 13.0–17.0)
Lymphocytes Relative: 21 % (ref 12–46)
Lymphs Abs: 1.9 10*3/uL (ref 0.7–4.0)
MCH: 31.5 pg (ref 26.0–34.0)
MCHC: 35.6 g/dL (ref 30.0–36.0)
MCV: 88.3 fL (ref 78.0–100.0)
Monocytes Absolute: 0.8 10*3/uL (ref 0.1–1.0)
Monocytes Relative: 9 % (ref 3–12)
Neutro Abs: 6 10*3/uL (ref 1.7–7.7)
Neutrophils Relative %: 68 % (ref 43–77)
Platelets: 296 10*3/uL (ref 150–400)
RBC: 4.8 MIL/uL (ref 4.22–5.81)
RDW: 12.4 % (ref 11.5–15.5)
WBC: 8.8 10*3/uL (ref 4.0–10.5)

## 2014-09-19 NOTE — ED Notes (Signed)
Pt arrived via EMS on stretcher with report of attempting suicide via [place]. Pt reported that this is not a cry for help. I don't want any help. I don't know how long I was in the car but not long enough. Pt appears depressed and sad. Pt reported that his life is not an option. Pt reported he does not know why he does not want to live.  Pt reported of going through things for 40years and he just tired. Denies visula/audible hallucinations.

## 2014-09-19 NOTE — ED Notes (Signed)
Bed: RESB Expected date: 09/19/14 Expected time: 10:09 PM Means of arrival: Ambulance Comments: Overdose/inhalation

## 2014-09-20 ENCOUNTER — Encounter (HOSPITAL_COMMUNITY): Payer: Self-pay | Admitting: Psychiatry

## 2014-09-20 ENCOUNTER — Inpatient Hospital Stay (HOSPITAL_COMMUNITY)
Admission: AD | Admit: 2014-09-20 | Discharge: 2014-09-23 | DRG: 885 | Disposition: A | Discharge status: Home / Self Care | Payer: Federal, State, Local not specified - Other | Source: Intra-hospital | Attending: Psychiatry | Admitting: Psychiatry

## 2014-09-20 DIAGNOSIS — R45851 Suicidal ideations: Secondary | ICD-10-CM | POA: Diagnosis present

## 2014-09-20 DIAGNOSIS — M109 Gout, unspecified: Secondary | ICD-10-CM | POA: Diagnosis present

## 2014-09-20 DIAGNOSIS — Z609 Problem related to social environment, unspecified: Secondary | ICD-10-CM | POA: Diagnosis present

## 2014-09-20 DIAGNOSIS — T1491XA Suicide attempt, initial encounter: Secondary | ICD-10-CM

## 2014-09-20 DIAGNOSIS — Z820 Family history of epilepsy and other diseases of the nervous system: Secondary | ICD-10-CM

## 2014-09-20 DIAGNOSIS — F314 Bipolar disorder, current episode depressed, severe, without psychotic features: Secondary | ICD-10-CM | POA: Diagnosis present

## 2014-09-20 DIAGNOSIS — F3132 Bipolar disorder, current episode depressed, moderate: Secondary | ICD-10-CM | POA: Diagnosis present

## 2014-09-20 DIAGNOSIS — Z8249 Family history of ischemic heart disease and other diseases of the circulatory system: Secondary | ICD-10-CM | POA: Diagnosis not present

## 2014-09-20 DIAGNOSIS — I10 Essential (primary) hypertension: Secondary | ICD-10-CM | POA: Diagnosis present

## 2014-09-20 DIAGNOSIS — F3130 Bipolar disorder, current episode depressed, mild or moderate severity, unspecified: Secondary | ICD-10-CM

## 2014-09-20 HISTORY — DX: Suicide attempt, initial encounter: T14.91XA

## 2014-09-20 LAB — COMPREHENSIVE METABOLIC PANEL
ALT: 30 U/L (ref 0–53)
AST: 30 U/L (ref 0–37)
Albumin: 4.9 g/dL (ref 3.5–5.2)
Alkaline Phosphatase: 77 U/L (ref 39–117)
Anion gap: 19 — ABNORMAL HIGH (ref 5–15)
BUN: 19 mg/dL (ref 6–23)
CO2: 21 mEq/L (ref 19–32)
Calcium: 10.4 mg/dL (ref 8.4–10.5)
Chloride: 102 mEq/L (ref 96–112)
Creatinine, Ser: 1.23 mg/dL (ref 0.50–1.35)
GFR calc Af Amer: 74 mL/min — ABNORMAL LOW (ref >90)
GFR calc non Af Amer: 64 mL/min — ABNORMAL LOW (ref >90)
Glucose, Bld: 109 mg/dL — ABNORMAL HIGH (ref 70–99)
Potassium: 4.2 mEq/L (ref 3.7–5.3)
Sodium: 142 mEq/L (ref 137–147)
Total Bilirubin: 0.5 mg/dL (ref 0.3–1.2)
Total Protein: 8.1 g/dL (ref 6.0–8.3)

## 2014-09-20 LAB — URINALYSIS, ROUTINE W REFLEX MICROSCOPIC
Bilirubin Urine: NEGATIVE
Glucose, UA: NEGATIVE mg/dL
Hgb urine dipstick: NEGATIVE
Ketones, ur: NEGATIVE mg/dL
Leukocytes, UA: NEGATIVE
Nitrite: NEGATIVE
Protein, ur: NEGATIVE mg/dL
Specific Gravity, Urine: 1.008 (ref 1.005–1.030)
Urobilinogen, UA: 0.2 mg/dL (ref 0.0–1.0)
pH: 7.5 (ref 5.0–8.0)

## 2014-09-20 LAB — RAPID URINE DRUG SCREEN, HOSP PERFORMED
Amphetamines: NOT DETECTED
Barbiturates: NOT DETECTED
Benzodiazepines: NOT DETECTED
Cocaine: NOT DETECTED
Opiates: NOT DETECTED
Tetrahydrocannabinol: NOT DETECTED

## 2014-09-20 LAB — ACETAMINOPHEN LEVEL: Acetaminophen (Tylenol), Serum: <15 ug/mL (ref 10–30)

## 2014-09-20 LAB — SALICYLATE LEVEL: Salicylate Lvl: <2 mg/dL — ABNORMAL LOW (ref 2.8–20.0)

## 2014-09-20 MED ORDER — LOSARTAN POTASSIUM 50 MG PO TABS
50.0000 mg | ORAL_TABLET | Freq: Every day | ORAL | Status: DC
  Administered 2014-09-20: 50 mg via ORAL
  Filled 2014-09-20 (×2): qty 1

## 2014-09-20 MED ORDER — AMLODIPINE BESYLATE 2.5 MG PO TABS
2.5000 mg | ORAL_TABLET | Freq: Every day | ORAL | Status: DC
  Administered 2014-09-20: 2.5 mg via ORAL
  Filled 2014-09-20 (×2): qty 1

## 2014-09-20 MED ORDER — DULOXETINE HCL 60 MG PO CPEP
120.0000 mg | ORAL_CAPSULE | Freq: Every day | ORAL | Status: DC
  Administered 2014-09-20 – 2014-09-22 (×3): 120 mg via ORAL
  Filled 2014-09-20: qty 28
  Filled 2014-09-20 (×5): qty 2

## 2014-09-20 MED ORDER — TRAZODONE HCL 50 MG PO TABS
50.0000 mg | ORAL_TABLET | Freq: Every evening | ORAL | Status: DC | PRN
  Administered 2014-09-20 – 2014-09-21 (×2): 50 mg via ORAL
  Filled 2014-09-20 (×2): qty 1

## 2014-09-20 MED ORDER — AMLODIPINE BESYLATE 2.5 MG PO TABS
2.5000 mg | ORAL_TABLET | Freq: Every day | ORAL | Status: DC
  Filled 2014-09-20 (×2): qty 1

## 2014-09-20 MED ORDER — FEBUXOSTAT 40 MG PO TABS
40.0000 mg | ORAL_TABLET | Freq: Every day | ORAL | Status: DC
  Administered 2014-09-21 – 2014-09-23 (×3): 40 mg via ORAL
  Filled 2014-09-20 (×5): qty 1

## 2014-09-20 MED ORDER — ONDANSETRON HCL 4 MG PO TABS: 4.0000 mg | ORAL_TABLET | Freq: Three times a day (TID) | ORAL | Status: DC | PRN

## 2014-09-20 MED ORDER — MAGNESIUM HYDROXIDE 400 MG/5ML PO SUSP: 30.0000 mL | Freq: Every day | ORAL | Status: DC | PRN

## 2014-09-20 MED ORDER — DIVALPROEX SODIUM ER 500 MG PO TB24
1000.0000 mg | ORAL_TABLET | Freq: Every day | ORAL | Status: DC
  Administered 2014-09-20 – 2014-09-22 (×3): 1000 mg via ORAL
  Filled 2014-09-20 (×2): qty 2
  Filled 2014-09-20: qty 28
  Filled 2014-09-20 (×3): qty 2

## 2014-09-20 MED ORDER — LISINOPRIL 10 MG PO TABS
10.0000 mg | ORAL_TABLET | Freq: Every day | ORAL | Status: DC
  Filled 2014-09-20: qty 1
  Filled 2014-09-20: qty 2
  Filled 2014-09-20: qty 1

## 2014-09-20 MED ORDER — ALUM & MAG HYDROXIDE-SIMETH 200-200-20 MG/5ML PO SUSP: 30.0000 mL | ORAL | Status: DC | PRN

## 2014-09-20 MED ORDER — COLCHICINE 0.6 MG PO TABS
0.6000 mg | ORAL_TABLET | Freq: Every day | ORAL | Status: DC
  Administered 2014-09-20: 0.6 mg via ORAL
  Filled 2014-09-20 (×2): qty 1

## 2014-09-20 MED ORDER — VITAMIN D 1000 UNITS PO TABS
2000.0000 [IU] | ORAL_TABLET | Freq: Every day | ORAL | Status: DC
  Administered 2014-09-20: 2000 [IU] via ORAL
  Filled 2014-09-20 (×2): qty 2

## 2014-09-20 MED ORDER — LAMOTRIGINE 150 MG PO TABS
150.0000 mg | ORAL_TABLET | Freq: Every day | ORAL | Status: DC
  Administered 2014-09-20: 150 mg via ORAL
  Filled 2014-09-20 (×2): qty 1

## 2014-09-20 MED ORDER — FEBUXOSTAT 40 MG PO TABS
40.0000 mg | ORAL_TABLET | Freq: Every day | ORAL | Status: DC
  Administered 2014-09-20: 40 mg via ORAL
  Filled 2014-09-20 (×2): qty 1

## 2014-09-20 MED ORDER — DIVALPROEX SODIUM ER 500 MG PO TB24
1000.0000 mg | ORAL_TABLET | Freq: Every day | ORAL | Status: DC
Start: 2014-09-20 — End: 2014-09-20
  Filled 2014-09-20: qty 2

## 2014-09-20 MED ORDER — DULOXETINE HCL 60 MG PO CPEP
120.0000 mg | ORAL_CAPSULE | Freq: Every day | ORAL | Status: DC
  Filled 2014-09-20: qty 2

## 2014-09-20 MED ORDER — LOSARTAN POTASSIUM 50 MG PO TABS
50.0000 mg | ORAL_TABLET | Freq: Every day | ORAL | Status: DC
  Administered 2014-09-22 – 2014-09-23 (×2): 50 mg via ORAL
  Filled 2014-09-20 (×5): qty 1

## 2014-09-20 MED ORDER — VITAMIN D3 25 MCG (1000 UNIT) PO TABS
2000.0000 [IU] | ORAL_TABLET | Freq: Every day | ORAL | Status: DC
  Administered 2014-09-21 – 2014-09-23 (×3): 2000 [IU] via ORAL
  Filled 2014-09-20 (×5): qty 2

## 2014-09-20 MED ORDER — COLCHICINE 0.6 MG PO TABS
0.6000 mg | ORAL_TABLET | Freq: Every day | ORAL | Status: DC
  Administered 2014-09-21 – 2014-09-23 (×3): 0.6 mg via ORAL
  Filled 2014-09-20 (×5): qty 1

## 2014-09-20 MED ORDER — ACETAMINOPHEN 325 MG PO TABS: 650.0000 mg | ORAL_TABLET | Freq: Four times a day (QID) | ORAL | Status: DC | PRN

## 2014-09-20 MED ORDER — LISINOPRIL 10 MG PO TABS
10.0000 mg | ORAL_TABLET | Freq: Every day | ORAL | Status: DC
  Administered 2014-09-20: 10 mg via ORAL
  Filled 2014-09-20 (×2): qty 1

## 2014-09-20 MED ORDER — LAMOTRIGINE 150 MG PO TABS
150.0000 mg | ORAL_TABLET | Freq: Every day | ORAL | Status: DC
  Administered 2014-09-21 – 2014-09-23 (×3): 150 mg via ORAL
  Filled 2014-09-20 (×5): qty 1

## 2014-09-20 NOTE — ED Notes (Addendum)
Dr. Juleen ChinaKohut was made aware of elevation of DBP and reported d/t pt asymptomatic will not address BP at this time.

## 2014-09-20 NOTE — ED Notes (Signed)
Pt resting at present, signed voluntary paperwork, Pending report and transfer to [facility].

## 2014-09-20 NOTE — ED Notes (Signed)
Pt assisted to bathroom to obtain urine specimen. 

## 2014-09-20 NOTE — ED Notes (Signed)
Valuables locked up with security, key in chart. One bag pt belongings containing clothing, shoes and wallet emptied of valuables given to [employer].

## 2014-09-20 NOTE — ED Notes (Signed)
D/c'd RAC IV per order, cath intact.

## 2014-09-20 NOTE — Consult Note (Addendum)
[facility] Consult   Reason for Consult:  Suicide attempt Referring Physician:  EDP  Edwin [name] is an 58 y.o. male. Total Time spent with patient: 45 minutes  Assessment: AXIS I:  Bipolar, Depressed AXIS II:  Deferred AXIS III:   Past Medical History  Diagnosis Date  . Hypertension   . Bipolar disorder   . Gout   . Allergic rhinitis   . Obesity     ,moderate  . Prostatitis     , Episodic  . Mild renal insufficiency     , with creatinine of 1.3 in 2010, was side effect of med  . Bradycardia 2012     due to lithium   AXIS IV:  other psychosocial or environmental problems, problems related to social environment and problems with primary support group AXIS V:  21-30 behavior considerably influenced by delusions or hallucinations OR serious impairment in judgment, communication OR inability to function in almost all areas  Plan:  Recommend psychiatric Inpatient admission when medically cleared.  Subjective:   Edwin [name] is a 58 y.o. male patient admitted with depression and suicide attempt.  HPI:  58 y.o. male presenting to [facility] ED after a suicide attempt. Pt stated "I am not ready to talk about it". It has been reported that pt attempted suicide via exhaust inhalation from [place] the [place].  Pt denies SI at this time but reported that earlier today he had thoughts to kill himself. Pt stated "I rather not talk about it". When pt was asked about previous suicide attempts he stated "I rather not talk about it". Pt reported that he has been hospitalized in the past and is currently seeing a psychiatrist and a therapist. Pt reported he has been diagnosed with depression since the age 42 but the onset was age 65. Pt stated "I had plenty of time to destroy everything". Pt is endorsing some depressive symptoms but when asked about stressor he stated "It's hard to identify". Pt also stated "I don't know, I've experienced it for so long and for so many years" when he  was asked about feeling hopeless. Pt did not report any issues with his appetite or sleep. Pt reported that he has been staying in the bed for the past 2 days and has not showered nor shaved. Pt did not report any HI or AVH at this time. Pt denied any illicit substance abuse or alcohol use. Pt denied having access to weapons or firearms and did not report any pending criminal charges at this time. Pt did not report any physical, sexual or emotional abuse at this time.  Pt is alert and oriented x3. Pt maintained poor eye contact throughout this assessment. Pt speech and thought process is logical and coherent. Pt mood is depressed and sad; pt's affect is congruent with his mood. Pt judgment and insight is poor. Pt is unable to reliably contract for safety at this time; therefore inpatient treatment is recommended for stabilization.  Today:  Patient remains depressed and suicidal.  He went to the graveyard to try and kill himself with car fumes, thinking he would not be found.  He has suffered depression most of his life and never had a girlfriend.  Six weeks ago an old acquaintance called him and they started a "positive, healthy relationship".  When the friend came to visit, she was disturbed that he felt more like an uncle to her (94 year age difference) and needed time to adjust.  He had  been ecstatic to finally have someone in his life and then he felt it was over.  Edwin [name] could not handle it and wanted to end his years of sufferings. He sees Fred May, counselor, with Dr. Charlott Holler.  His sister lives in town and thinks she is close to him but he denies.  His mother is in a nursing home. HPI Elements:   Location:  generalized. Quality:  acute. Severity:  severe. Timing:  constant. Duration:  few days. Context:  break up of a relationship.  Past Psychiatric History: Past Medical History  Diagnosis Date  . Hypertension   . Bipolar disorder   . Gout   . Allergic rhinitis   . Obesity     ,moderate  .  Prostatitis     , Episodic  . Mild renal insufficiency     , with creatinine of 1.3 in 2010, was side effect of med  . Bradycardia 2012     due to lithium    reports that he has never smoked. He has never used smokeless tobacco. He reports that he does not drink alcohol or use illicit drugs. Family History  Problem Relation Age of Onset  . Other Mother     Viral Meningitis  . Mitral valve prolapse Mother   . Arrhythmia Mother   . Hypothyroidism Mother   . Hypertension Father   . Gout Father   . Alzheimer's disease Father    Family History Substance Abuse: No Family Supports: No ("No one" ) Living Arrangements: Alone ("with my 3 cats") Can pt return to current living arrangement?: Yes Abuse/Neglect [facility]) Physical Abuse: Denies Verbal Abuse: Denies Sexual Abuse: Denies Allergies:   Allergies  Allergen Reactions  . Allopurinol Other (See Comments)    emotionally labile  . Penicillins Hives  . Ciprofloxacin Rash    ACT Assessment Complete:  Yes:    Educational Status    Risk to Self: Risk to self with the past 6 months Suicidal Ideation: Yes-Currently Present ("Earlier today but I would rather not talk about it".) Suicidal Intent: Yes-Currently Present Is patient at risk for suicide?: Yes Suicidal Plan?: Yes-Currently Present Specify Current Suicidal Plan: Exhaust Inhalation  Access to Means: Yes Specify Access to Suicidal Means: Pt was found [place] his car at a [place] What has been your use of drugs/alcohol within the last 12 months?: No alcohol or drug use reported. Previous Attempts/Gestures:  ("I would rather not talk about it" ) How many times?:  ("I would rather not talk about it") Other Self Harm Risks: No other self harm risk identified at this time.  Triggers for Past Attempts: Unknown Intentional Self Injurious Behavior: None Family Suicide History: Yes (Paternal Grandfather) Recent stressful life event(s):  ("It's hard to identify" ) Persecutory  voices/beliefs?: No Depression: Yes Depression Symptoms: Despondent, Isolating, Tearfulness, Fatigue Substance abuse history and/or treatment for substance abuse?: No Suicide prevention information given to non-admitted patients: Not applicable  Risk to Others: Risk to Others within the past 6 months Homicidal Ideation: No Thoughts of Harm to Others: No Current Homicidal Intent: No Current Homicidal Plan: No Access to Homicidal Means: No Identified Victim: N/A History of harm to others?: No Assessment of Violence: None Noted Violent Behavior Description: No violent behaviors observed at this time. Pt is calm and cooperative.  Does patient have access to weapons?: No Criminal Charges Pending?: No Does patient have a court date: No  Abuse: Abuse/Neglect Assessment (Assessment to be complete while patient is alone) Physical Abuse: Denies Verbal Abuse:  Denies Sexual Abuse: Denies Exploitation of patient/patient's resources: Denies Self-Neglect: Denies  Prior Inpatient Therapy: Prior Inpatient Therapy Prior Inpatient Therapy: Yes Prior Therapy Dates: 1988 Prior Therapy Facilty/Provider(s): [facility] Reason for Treatment: Bipolar   Prior Outpatient Therapy: Prior Outpatient Therapy Prior Outpatient Therapy: Yes Prior Therapy Dates: Age 8 to present Prior Therapy Facilty/Provider(s): Dr. Clovis Pu, Josph Macho May Reason for Treatment: Bipolar  Additional Information: Additional Information 1:1 In Past 12 Months?: No CIRT Risk: No Elopement Risk: No                  Objective: Blood pressure 140/90, pulse 80, temperature 97.8 F (36.6 C), temperature source Oral, resp. rate 16, height '5\' 10"'  (1.778 m), weight 185 lb (83.915 kg), SpO2 97 %.Body mass index is 26.54 kg/(m^2). Results for orders placed or performed during the hospital encounter of 09/19/14 (from the past 72 hour(s))  Carboxyhemoglobin     Status: Abnormal   Collection Time: 09/19/14 11:17 PM  Result Value Ref  Range   Total hemoglobin 16.0 13.5 - 18.0 g/dL   O2 Saturation 49.9 %   Carboxyhemoglobin 1.5 0.5 - 1.5 %   Methemoglobin 2.7 (H) 0.0 - 1.5 %  CBC with Differential     Status: None   Collection Time: 09/19/14 11:19 PM  Result Value Ref Range   WBC 8.8 4.0 - 10.5 K/uL   RBC 4.80 4.22 - 5.81 MIL/uL   Hemoglobin 15.1 13.0 - 17.0 g/dL   HCT 42.4 39.0 - 52.0 %   MCV 88.3 78.0 - 100.0 fL   MCH 31.5 26.0 - 34.0 pg   MCHC 35.6 30.0 - 36.0 g/dL   RDW 12.4 11.5 - 15.5 %   Platelets 296 150 - 400 K/uL   Neutrophils Relative % 68 43 - 77 %   Neutro Abs 6.0 1.7 - 7.7 K/uL   Lymphocytes Relative 21 12 - 46 %   Lymphs Abs 1.9 0.7 - 4.0 K/uL   Monocytes Relative 9 3 - 12 %   Monocytes Absolute 0.8 0.1 - 1.0 K/uL   Eosinophils Relative 1 0 - 5 %   Eosinophils Absolute 0.1 0.0 - 0.7 K/uL   Basophils Relative 1 0 - 1 %   Basophils Absolute 0.1 0.0 - 0.1 K/uL  Comprehensive metabolic panel     Status: Abnormal   Collection Time: 09/19/14 11:19 PM  Result Value Ref Range   Sodium 142 137 - 147 mEq/L   Potassium 4.2 3.7 - 5.3 mEq/L   Chloride 102 96 - 112 mEq/L   CO2 21 19 - 32 mEq/L   Glucose, Bld 109 (H) 70 - 99 mg/dL   BUN 19 6 - 23 mg/dL   Creatinine, Ser 1.23 0.50 - 1.35 mg/dL   Calcium 10.4 8.4 - 10.5 mg/dL   Total Protein 8.1 6.0 - 8.3 g/dL   Albumin 4.9 3.5 - 5.2 g/dL   AST 30 0 - 37 U/L    Comment: SLIGHT HEMOLYSIS HEMOLYSIS AT THIS LEVEL MAY AFFECT RESULT    ALT 30 0 - 53 U/L   Alkaline Phosphatase 77 39 - 117 U/L   Total Bilirubin 0.5 0.3 - 1.2 mg/dL   GFR calc non Af Amer 64 (L) >90 mL/min   GFR calc Af Amer 74 (L) >90 mL/min    Comment: (NOTE) The eGFR has been calculated using the CKD EPI equation. This calculation has not been validated in all clinical situations. eGFR's persistently <90 mL/min signify possible Chronic Kidney Disease.  Anion gap 19 (H) 5 - 15  Salicylate level     Status: Abnormal   Collection Time: 09/19/14 11:19 PM  Result Value Ref Range    Salicylate Lvl <0.3 (L) 2.8 - 20.0 mg/dL  Acetaminophen level     Status: None   Collection Time: 09/19/14 11:19 PM  Result Value Ref Range   Acetaminophen (Tylenol), Serum <15.0 10 - 30 ug/mL    Comment:        THERAPEUTIC CONCENTRATIONS VARY SIGNIFICANTLY. A RANGE OF 10-30 ug/mL MAY BE AN EFFECTIVE CONCENTRATION FOR MANY PATIENTS. HOWEVER, SOME ARE BEST TREATED AT CONCENTRATIONS OUTSIDE THIS RANGE. ACETAMINOPHEN CONCENTRATIONS >150 ug/mL AT 4 HOURS AFTER INGESTION AND >50 ug/mL AT 12 HOURS AFTER INGESTION ARE OFTEN ASSOCIATED WITH TOXIC REACTIONS.   Urinalysis, Routine w reflex microscopic     Status: None   Collection Time: 09/20/14 12:58 AM  Result Value Ref Range   Color, Urine YELLOW YELLOW   APPearance CLEAR CLEAR   Specific Gravity, Urine 1.008 1.005 - 1.030   pH 7.5 5.0 - 8.0   Glucose, UA NEGATIVE NEGATIVE mg/dL   Hgb urine dipstick NEGATIVE NEGATIVE   Bilirubin Urine NEGATIVE NEGATIVE   Ketones, ur NEGATIVE NEGATIVE mg/dL   Protein, ur NEGATIVE NEGATIVE mg/dL   Urobilinogen, UA 0.2 0.0 - 1.0 mg/dL   Nitrite NEGATIVE NEGATIVE   Leukocytes, UA NEGATIVE NEGATIVE    Comment: MICROSCOPIC NOT DONE ON URINES WITH NEGATIVE PROTEIN, BLOOD, LEUKOCYTES, NITRITE, OR GLUCOSE <1000 mg/dL.  Drug screen panel, emergency     Status: None   Collection Time: 09/20/14 12:58 AM  Result Value Ref Range   Opiates NONE DETECTED NONE DETECTED   Cocaine NONE DETECTED NONE DETECTED   Benzodiazepines NONE DETECTED NONE DETECTED   Amphetamines NONE DETECTED NONE DETECTED   Tetrahydrocannabinol NONE DETECTED NONE DETECTED   Barbiturates NONE DETECTED NONE DETECTED    Comment:        DRUG SCREEN FOR MEDICAL PURPOSES ONLY.  IF CONFIRMATION IS NEEDED FOR ANY PURPOSE, NOTIFY LAB WITHIN 5 DAYS.        LOWEST DETECTABLE LIMITS FOR URINE DRUG SCREEN Drug Class       Cutoff (ng/mL) Amphetamine      1000 Barbiturate      200 Benzodiazepine   559 Tricyclics       741 Opiates           300 Cocaine          300 THC              50    Labs are reviewed and are pertinent for no medical issues noted.  Current Facility-Administered Medications  Medication Dose Route Frequency Provider Last Rate Last Dose  . amLODipine (NORVASC) tablet 2.5 mg  2.5 mg Oral Daily Virgel Manifold, MD   2.5 mg at 09/20/14 1012  . cholecalciferol (VITAMIN D) tablet 2,000 Units  2,000 Units Oral Daily Virgel Manifold, MD   2,000 Units at 09/20/14 1012  . colchicine tablet 0.6 mg  0.6 mg Oral Daily Virgel Manifold, MD   0.6 mg at 09/20/14 1012  . divalproex (DEPAKOTE ER) 24 hr tablet 1,000 mg  1,000 mg Oral QHS Virgel Manifold, MD      . DULoxetine (CYMBALTA) DR capsule 120 mg  120 mg Oral QHS Virgel Manifold, MD      . febuxostat (ULORIC) tablet 40 mg  40 mg Oral Daily Virgel Manifold, MD   40 mg at 09/20/14 1012  . lamoTRIgine (  LAMICTAL) tablet 150 mg  150 mg Oral Daily Virgel Manifold, MD   150 mg at 09/20/14 1012  . lisinopril (PRINIVIL,ZESTRIL) tablet 10 mg  10 mg Oral Daily Virgel Manifold, MD   10 mg at 09/20/14 1012  . losartan (COZAAR) tablet 50 mg  50 mg Oral Daily Virgel Manifold, MD   50 mg at 09/20/14 1012  . ondansetron (ZOFRAN) tablet 4 mg  4 mg Oral Q8H PRN Virgel Manifold, MD       Current Outpatient Prescriptions  Medication Sig Dispense Refill  . amLODipine (NORVASC) 5 MG tablet Take 2.5 mg by mouth daily.     . Cholecalciferol (VITAMIN D3) 2000 UNITS TABS Take 2,000 Units by mouth daily.     [name] COLCRYS 0.6 MG tablet Take 0.6 mg by mouth daily.     . divalproex (DEPAKOTE ER) 500 MG 24 hr tablet Take 1,000 mg by mouth at bedtime.     . DULoxetine (CYMBALTA) 60 MG capsule Take 120 mg by mouth at bedtime.     . lamoTRIgine (LAMICTAL) 150 MG tablet Take 150 mg by mouth daily.     . LevOCARNitine (L-CARNITINE) 250 MG TABS Take 500 mg by mouth 2 (two) times daily.     [name] losartan (COZAAR) 100 MG tablet Take 50 mg by mouth daily.     [name] ULORIC 80 MG TABS Take 40 mg by mouth daily.       Psychiatric  Specialty Exam:     Blood pressure 140/90, pulse 80, temperature 97.8 F (36.6 C), temperature source Oral, resp. rate 16, height '5\' 10"'  (1.778 m), weight 185 lb (83.915 kg), SpO2 97 %.Body mass index is 26.54 kg/(m^2).  General Appearance: Disheveled  Eye Sport and exercise psychologist::  Fair  Speech:  Normal Rate  Volume:  Normal  Mood:  Depressed  Affect:  Congruent  Thought Process:  Coherent  Orientation:  Full (Time, Place, and Person)  Thought Content:  Rumination  Suicidal Thoughts:  Yes.  with intent/plan  Homicidal Thoughts:  No  Memory:  Immediate;   Good Recent;   Good Remote;   Good  Judgement:  Poor  Insight:  Fair  Psychomotor Activity:  Decreased  Concentration:  Good  Recall:  Good  Fund of Knowledge:Fair  Language: Good  Akathisia:  No  Handed:  Right  AIMS (if indicated):     Assets:  Financial Resources/Insurance Housing Leisure Time Physical Health Resilience Talents/Skills Vocational/Educational  Sleep:      Musculoskeletal: Strength & Muscle Tone: within normal limits Gait & Station: normal Patient leans: N/A  Treatment Plan Summary: Daily contact with patient to assess and evaluate symptoms and progress in treatment Medication management; restart home medications, admit to inpatient psychiatric unit for stabilization  Waylan Boga, PMH-NP 09/20/2014 3:22 PM  Patient seen, evaluated and I agree with notes by Nurse Practitioner. Corena Pilgrim, MD

## 2014-09-20 NOTE — BH Assessment (Signed)
Assessment completed. Inpatient treatment is recommended. Dr. Juleen China has been informed of the recommendation.

## 2014-09-20 NOTE — ED Notes (Signed)
Resting quietly with eyes closed. Easily arousable. Verbally responsive. Resp even and unlabored. ABC's intact. Mental health at bedside. No behavior problems noted.

## 2014-09-20 NOTE — ED Notes (Signed)
Report given to Jan, RN in [place]

## 2014-09-20 NOTE — ED Notes (Signed)
Report called to RN Linda,[facility]. Pending transfer via [facility] transport.

## 2014-09-20 NOTE — ED Notes (Signed)
Pt's affect is flat and depressed. Pt reports that he started talking to a male recently that he knew several years ago. They planned a romantic relationship and when she came to see him he had PTSD flashbacks. He then attempted suicide. He reports that he has not tried to hurt himself in thirty years. Pt says that he constantly feels lonely. He has a poor relationship with his sister. Pt's mother had a stoke. The pt has two graduate degrees and copes with his depression using his work. Pt denies hi. He denies hallucinations.

## 2014-09-20 NOTE — ED Notes (Signed)
Pt resting quietly with eyes closed. Easily arousable. Verbally responsive. Resp even and unlabored. No behavior problems noted. Pleaseant and cooperative mood. NAD noted.

## 2014-09-20 NOTE — BH Assessment (Signed)
Tele Assessment Note   Edwin [name] is an 58 y.o. male presenting to [facility] ED after a suicide attempt. Pt stated "I am not ready to talk about it". It has been reported that pt attempted [place]  Pt denies SI at this time but reported that earlier today he had thoughts to kill himself. Pt stated "I rather not talk about it". When pt was asked about previous suicide attempts he stated "I rather not talk about it". Pt reported that he has been hospitalized in the past and is currently seeing a psychiatrist and a therapist. Pt reported he has been diagnosed with depression since the age 89 but the onset was age 27. Pt stated "I had plenty of time to destroy everything". Pt is endorsing some depressive symptoms but when asked about stressor he stated "It's hard to identify". Pt also stated "I don't know, I've experienced it for so long and for so many years" when he was asked about feeling hopeless. Pt did not report any issues with his appetite or sleep. Pt reported that he has been staying in the bed for the past 2 days and has not showered nor shaved.  Pt did not report any HI or AVH at this time. Pt denied any illicit substance abuse or alcohol use. Pt denied having access to weapons or firearms and did not report any pending criminal charges at this time. Pt did not report any physical, sexual or emotional abuse at this time.  Pt is alert and oriented x3. Pt maintained poor eye contact throughout this assessment. Pt speech and thought process is logical and coherent. Pt mood is depressed and sad; pt's affect is congruent with his mood. Pt judgment and insight is poor. Pt is unable to reliably contract for safety at this time; therefore inpatient treatment is recommended for stabilization.   Axis I: Bipolar, Depressed  Past Medical History:  Past Medical History  Diagnosis Date  . Hypertension   . Bipolar disorder   . Gout   . Allergic rhinitis   .  Obesity     ,moderate  . Prostatitis     , Episodic  . Mild renal insufficiency     , with creatinine of 1.3 in 2010, was side effect of med  . Bradycardia 2012     due to lithium    Past Surgical History  Procedure Laterality Date  . Appendectomy    . Tonsillectomy    . Umbilical hernia repair    . Cataract extraction Right   . Colonoscopy with propofol N/A 06/14/2014    Procedure: COLONOSCOPY WITH PROPOFOL;  Surgeon: Charolett Bumpers, MD;  Location: [facility] ENDOSCOPY;  Service: Endoscopy;  Laterality: N/A;    Family History:  Family History  Problem Relation Age of Onset  . Other Mother     Viral Meningitis  . Mitral valve prolapse Mother   . Arrhythmia Mother   . Hypothyroidism Mother   . Hypertension Father   . Gout Father   . Alzheimer's disease Father     Social History:  reports that he has never smoked. He has never used smokeless tobacco. He reports that he does not drink alcohol or use illicit drugs.  Additional Social History:  Alcohol / Drug Use History of alcohol / drug use?: No history of alcohol / drug abuse  CIWA: CIWA-Ar BP: (!) 157/108 mmHg Pulse Rate: 88 COWS:    PATIENT STRENGTHS: (choose at least two)  Average or above average intelligence Capable of independent living  Allergies:  Allergies  Allergen Reactions  . Allopurinol Other (See Comments)    emotionally labile  . Penicillins Hives  . Ciprofloxacin Rash    Home Medications:  (Not in a hospital admission)  OB/GYN Status:  No LMP for male patient.  General Assessment Data Location of Assessment: [facility] ED Is this a Tele or Face-to-Face Assessment?: Face-to-Face Is this an Initial Assessment or a Re-assessment for this encounter?: Initial Assessment Living Arrangements: Alone ("with my 3 cats") Can pt return to current living arrangement?: Yes Admission Status: Voluntary Is patient capable of signing voluntary admission?: Yes Transfer from: Home Referral Source:  Self/Family/Friend     [facility] Living Arrangements: Alone ("with my 3 cats") Name of Psychiatrist: Dr. Jacqulynn Cadetarey G. Cottle Name of Therapist: Merlyn AlbertFred May  Education Status Is patient currently in school?: No  Risk to self with the past 6 months Suicidal Ideation: Yes-Currently Present ("Earlier today but I would rather not talk about it".) Suicidal Intent: Yes-Currently Present Is patient at risk for suicide?: Yes Suicidal Plan?: Yes-Currently Present Specify Current Suicidal Plan: Exhaust Inhalation  Access to Means: Yes Specify Access to Suicidal Means: Pt was [place] his [place] What has been your use of drugs/alcohol within the last 12 months?: No alcohol or drug use reported. Previous Attempts/Gestures:  ("I would rather not talk about it" ) How many times?:  ("I would rather not talk about it") Other Self Harm Risks: No other self harm risk identified at this time.  Triggers for Past Attempts: Unknown Intentional Self Injurious Behavior: None Family Suicide History: Yes (Paternal Grandfather) Recent stressful life event(s):  ("It's hard to identify" ) Persecutory voices/beliefs?: No Depression: Yes Depression Symptoms: Despondent, Isolating, Tearfulness, Fatigue Substance abuse history and/or treatment for substance abuse?: No Suicide prevention information given to non-admitted patients: Not applicable  Risk to Others within the past 6 months Homicidal Ideation: No Thoughts of Harm to Others: No Current Homicidal Intent: No Current Homicidal Plan: No Access to Homicidal Means: No Identified Victim: N/A History of harm to others?: No Assessment of Violence: None Noted Violent Behavior Description: No violent behaviors observed at this time. Pt is calm and cooperative.  Does patient have access to weapons?: No Criminal Charges Pending?: No Does patient have a court date: No  Psychosis Hallucinations: None noted Delusions: None noted  Mental  Status Report Appear/Hygiene: In hospital gown Eye Contact: Poor Motor Activity: Rigidity Speech: Logical/coherent Level of Consciousness: Quiet/awake Mood: Depressed, Sad Affect: Depressed, Sad Anxiety Level: None Thought Processes: Relevant, Coherent Judgement: Partial Orientation: Person, Place, Time, Situation Obsessive Compulsive Thoughts/Behaviors: None  Cognitive Functioning Concentration: Normal Memory: Recent Intact, Remote Intact IQ: Average Insight: Poor Impulse Control: Poor Appetite: Fair Weight Loss: 0 Weight Gain: 0 Sleep: No Change Total Hours of Sleep: 8 Vegetative Symptoms: Staying in bed, Not bathing, Decreased grooming (Past 2 days )  ADLScreening [facility]) Patient's cognitive ability adequate to safely complete daily activities?: Yes Patient able to express need for assistance with ADLs?: Yes Independently performs ADLs?: Yes (appropriate for developmental age)  Prior Inpatient Therapy Prior Inpatient Therapy: Yes Prior Therapy Dates: 1988 [facility] Reason for Treatment: Bipolar   Prior Outpatient Therapy Prior Outpatient Therapy: Yes Prior Therapy Dates: Age 28 to present Prior Therapy Facilty/Provider(s): Dr. Jennelle Humanottle, Merlyn AlbertFred May Reason for Treatment: Bipolar  ADL Screening (condition at time of admission) Patient's cognitive ability adequate to safely complete  daily activities?: Yes Is the patient deaf or have difficulty hearing?: No Does the patient have difficulty seeing, even when wearing glasses/contacts?: No Does the patient have difficulty concentrating, remembering, or making decisions?: No Patient able to express need for assistance with ADLs?: Yes Does the patient have difficulty dressing or bathing?: No Independently performs ADLs?: Yes (appropriate for developmental age)       Abuse/Neglect Assessment (Assessment to be complete while patient is alone) Physical Abuse:  Denies Verbal Abuse: Denies Sexual Abuse: Denies Exploitation of patient/patient's resources: Denies Self-Neglect: Denies     [employer]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

## 2014-09-21 ENCOUNTER — Encounter (HOSPITAL_COMMUNITY): Payer: Self-pay

## 2014-09-21 DIAGNOSIS — F3130 Bipolar disorder, current episode depressed, mild or moderate severity, unspecified: Secondary | ICD-10-CM

## 2014-09-21 MED ORDER — AMLODIPINE BESYLATE 5 MG PO TABS
5.0000 mg | ORAL_TABLET | Freq: Every day | ORAL | Status: DC
  Administered 2014-09-22 – 2014-09-23 (×2): 5 mg via ORAL
  Filled 2014-09-21 (×4): qty 1

## 2014-09-21 NOTE — Tx Team (Signed)
Initial Interdisciplinary Treatment Plan   PATIENT STRESSORS: Loss of Significant relationship   PROBLEM LIST: Problem List/Patient Goals Date to be addressed Date deferred Reason deferred Estimated date of resolution  depression 09/21/2014     anxiety 11/030125     Loss of relationship 09/21/2014     Not showered and bath in 3 day 11/200152                                    DISCHARGE CRITERIA:  Adequate post-discharge living arrangements Improved stabilization in mood, thinking, and/or behavior Motivation to continue treatment in a less acute level of care Verbal commitment to aftercare and medication compliance  PRELIMINARY DISCHARGE PLAN:   PATIENT/FAMIILY INVOLVEMENT: This treatment plan has been presented to and reviewed with the patient, Edwin [name], and/or family member,.  The patient and family have been given the opportunity to ask questions and make suggestions.  [name], Kassadi Presswood K 09/21/2014, 4:24 AM

## 2014-09-21 NOTE — H&P (Signed)
Psychiatric Admission Assessment Adult  Patient Identification:  Edwin [name] Date of Evaluation:  09/21/2014 Chief Complaint:  " I got really depressed" History of Present Illness:: Patient is a 58 year old man, who states he has a history of bipolar disorder . He states he had been doing well for a long time. He had been relatively stable and able to function well in daily activities. He states he has a long history of having some difficulties in his establishing and maintaining relationships, particularly with women. He had been interacting via internet, with a woman he had known  In the past. After many years, she had contacted him and they had a friendship via internet. They agreed to meet and she came to see him. He states she felt " uncomfortable because she had known me when she was young, and  Did not continue visit. After this he felt acutely depressed and very upset " that I cannot have a normal relationship with a woman for some reason". He became acutely suicidal and drove self [place] where he tried to poison self with CO by leaving car running- somebody noticed and alerted authorities- he was brought to ED. States that before the above stressor he was not particularly depressed and was not manic or hypomanic either. Elements: Severe depression,in the context of failed relationship/rejection. Attempted suicide. Associated Signs/Synptoms: Depression Symptoms:  depressed mood, decreased appetite, some feelings of guilt. Denies recent anhedonia, states he has a good sense of self esteem , denies recent suicidal ideations prior to event (Hypo) Manic Symptoms: denies any recent  Manic symptoms and does not present with mania or hypomania at this time Anxiety Symptoms:Denies  Psychotic Symptoms:  Denies  PTSD Symptoms: Does not endorse  Total Time spent with patient: 45 minutes  Psychiatric Specialty Exam: Physical Exam  Review of Systems  Constitutional: Negative for fever  and chills.  Respiratory: Negative for cough and shortness of breath.   Cardiovascular: Negative for chest pain and leg swelling.  Gastrointestinal: Negative for nausea, vomiting, blood in stool and melena.  Genitourinary: Negative for dysuria, urgency and frequency.  Skin: Negative for rash.  Neurological: Negative for seizures, loss of consciousness and headaches.  Psychiatric/Behavioral: Positive for depression and suicidal ideas.    Blood pressure 132/85, pulse 83, temperature 98.2 F (36.8 C), temperature source Oral, resp. rate 16, height '5\' 11"'  (1.803 m), weight 82.101 kg (181 lb), SpO2 100 %.Body mass index is 25.26 kg/(m^2).  General Appearance: Fairly Groomed  Engineer, water::  Good  Speech:  Normal Rate  Volume:  Normal  Mood:  Depressed  Affect:  Constricted and but reactive  Thought Process:  Goal Directed and Linear  Orientation:  Full (Time, Place, and Person)  Thought Content:  denies hallucinations, no delusions, not internally preoccupied   Suicidal Thoughts:  No- as above, recent serious suicide attempt, but at this time denies any current SI and contracts for safety on the unit   Homicidal Thoughts:  No- also , specifically denies any thoughts of violence towards woman described above   Memory:  recent and remote grossly intact   Judgement:  Fair  Insight:  Fair  Psychomotor Activity:  Normal  Concentration:  Good  Recall:  Good  Fund of Knowledge:Good  Language: Good  Akathisia:  Negative  Handed:  Left  AIMS (if indicated):     Assets:  Communication Skills Desire for Improvement Resilience  Sleep:  Number of Hours: 4.25    Musculoskeletal: Strength &  Muscle Tone: within normal limits Gait & Station: normal Patient leans: N/A  Past Psychiatric History: Diagnosis: Patient states he has been diagnosed with Bipolar Disorder in his 33s. Describes periods of hypomania and episodes of depression. Denies psychosis history  Hospitalizations: 2 prior  psychiatric admissions in the 1980's. Not in many years  Outpatient Care: Dr. Curt Jews is psychiatrist, also goes to [facility] for psychotherapy  Substance Abuse Care: denies substance abuse   Self-Mutilation: Denies prior suicidal attempts  Or self cutting   Suicidal Attempts:Denies   Violent Behaviors: Denies history of violence    Past Medical History:   Past Medical History  Diagnosis Date  . Hypertension   . Bipolar disorder   . Gout   . Allergic rhinitis   . Obesity     ,moderate  . Prostatitis     , Episodic  . Mild renal insufficiency     , with creatinine of 1.3 in 2010, was side effect of med  . Bradycardia 2012     due to lithium   Loss of Consciousness:  Denies Seizure History:  Denies Cardiac History:  denies Allergies:   Allergies  Allergen Reactions  . Allopurinol Other (See Comments)    emotionally labile  . Aspirin Hives  . Penicillins Hives  . Ciprofloxacin Rash   PTA Medications: Prescriptions prior to admission  Medication Sig Dispense Refill Last Dose  . amLODipine (NORVASC) 5 MG tablet Take 2.5 mg by mouth daily.    09/20/2014 at Unknown time  . Cholecalciferol (VITAMIN D3) 2000 UNITS TABS Take 2,000 Units by mouth daily.    09/20/2014 at Unknown time  . LevOCARNitine (L-CARNITINE) 250 MG TABS Take 500 mg by mouth 2 (two) times daily.    Past Week at Unknown time  . losartan (COZAAR) 100 MG tablet Take 50 mg by mouth daily.    09/20/2014 at Unknown time  . ULORIC 80 MG TABS Take 40 mg by mouth daily.    09/20/2014 at Unknown time  . COLCRYS 0.6 MG tablet Take 0.6 mg by mouth daily.    Past Week at Unknown time  . divalproex (DEPAKOTE ER) 500 MG 24 hr tablet Take 1,000 mg by mouth at bedtime.    Past Week at Unknown time  . DULoxetine (CYMBALTA) 60 MG capsule Take 120 mg by mouth at bedtime.    Past Week at Unknown time  . lamoTRIgine (LAMICTAL) 150 MG tablet Take 150 mg by mouth daily.    Past Week at Unknown time    Previous Psychotropic  Medications:  Medication/Dose  States he has been on Depakote ER, Cymbalta, Lamictal for a long period of time and that these medications have been effective and well tolerated.  Had been on Lithium in the past, but stopped due to cardiac rhythm changes                Substance Abuse History in the last 12 months:  No.- denies any history of alcohol or drug abuse or dependence   Consequences of Substance Abuse: denies   Social History:  reports that he has never smoked. He has never used smokeless tobacco. He reports that he does not drink alcohol or use illicit drugs. Additional Social History:  Current Place of Residence:  Lives alone Place of Birth:   Family Members: Marital Status:  Single Children: no children   Sons:  Daughters: Relationships:no current S.O.  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 Legal History: Denies  Hobbies/Interests:  Family History:  Father died in the 50's from Alzheimer's, Mother alive, but had severe stroke several months ago and is now in [place], one sister , grandfather committed suicide, and was alcoholic  Family History  Problem Relation Age of Onset  . Other Mother     Viral Meningitis  . Mitral valve prolapse Mother   . Arrhythmia Mother   . Hypothyroidism Mother   . Hypertension Father   . Gout Father   . Alzheimer's disease Father     Results for orders placed or performed during the hospital encounter of 09/19/14 (from the past 72 hour(s))  Carboxyhemoglobin     Status: Abnormal   Collection Time: 09/19/14 11:17 PM  Result Value Ref Range   Total hemoglobin 16.0 13.5 - 18.0 g/dL   O2 Saturation 49.9 %   Carboxyhemoglobin 1.5 0.5 - 1.5 %   Methemoglobin 2.7 (H) 0.0 - 1.5 %  CBC with Differential      Status: None   Collection Time: 09/19/14 11:19 PM  Result Value Ref Range   WBC 8.8 4.0 - 10.5 K/uL   RBC 4.80 4.22 - 5.81 MIL/uL   Hemoglobin 15.1 13.0 - 17.0 g/dL   HCT 42.4 39.0 - 52.0 %   MCV 88.3 78.0 - 100.0 fL   MCH 31.5 26.0 - 34.0 pg   MCHC 35.6 30.0 - 36.0 g/dL   RDW 12.4 11.5 - 15.5 %   Platelets 296 150 - 400 K/uL   Neutrophils Relative % 68 43 - 77 %   Neutro Abs 6.0 1.7 - 7.7 K/uL   Lymphocytes Relative 21 12 - 46 %   Lymphs Abs 1.9 0.7 - 4.0 K/uL   Monocytes Relative 9 3 - 12 %   Monocytes Absolute 0.8 0.1 - 1.0 K/uL   Eosinophils Relative 1 0 - 5 %   Eosinophils Absolute 0.1 0.0 - 0.7 K/uL   Basophils Relative 1 0 - 1 %   Basophils Absolute 0.1 0.0 - 0.1 K/uL  Comprehensive metabolic panel     Status: Abnormal   Collection Time: 09/19/14 11:19 PM  Result Value Ref Range   Sodium 142 137 - 147 mEq/L   Potassium 4.2 3.7 - 5.3 mEq/L   Chloride 102 96 - 112 mEq/L   CO2 21 19 - 32 mEq/L   Glucose, Bld 109 (H) 70 - 99 mg/dL   BUN 19 6 - 23 mg/dL   Creatinine, Ser 1.23 0.50 - 1.35 mg/dL   Calcium 10.4 8.4 - 10.5 mg/dL   Total Protein 8.1 6.0 - 8.3 g/dL   Albumin 4.9 3.5 - 5.2 g/dL   AST 30 0 - 37 U/L    Comment: SLIGHT HEMOLYSIS HEMOLYSIS AT THIS LEVEL MAY AFFECT RESULT    ALT 30 0 - 53 U/L   Alkaline Phosphatase 77 39 - 117 U/L   Total Bilirubin 0.5 0.3 - 1.2 mg/dL   GFR calc non Af Amer 64 (L) >90 mL/min   GFR calc Af Amer 74 (L) >90 mL/min    Comment: (NOTE) The eGFR has been calculated using the CKD EPI equation. This calculation has not been validated in all clinical situations. eGFR's persistently <90 mL/min signify possible Chronic Kidney Disease.    Anion gap 19 (H) 5 - 15  Salicylate level     Status: Abnormal   Collection Time: 09/19/14 11:19 PM  Result Value Ref Range   Salicylate Lvl <8.2 (L) 2.8 - 20.0 mg/dL  Acetaminophen level     Status: None   Collection Time: 09/19/14 11:19 PM  Result Value Ref Range   Acetaminophen (Tylenol), Serum  <15.0 10 - 30 ug/mL    Comment:        THERAPEUTIC CONCENTRATIONS VARY SIGNIFICANTLY. A RANGE OF 10-30 ug/mL MAY BE AN EFFECTIVE CONCENTRATION FOR MANY PATIENTS. HOWEVER, SOME ARE BEST TREATED AT CONCENTRATIONS OUTSIDE THIS RANGE. ACETAMINOPHEN CONCENTRATIONS >150 ug/mL AT 4 HOURS AFTER INGESTION AND >50 ug/mL AT 12 HOURS AFTER INGESTION ARE OFTEN ASSOCIATED WITH TOXIC REACTIONS.   Urinalysis, Routine w reflex microscopic     Status: None   Collection Time: 09/20/14 12:58 AM  Result Value Ref Range   Color, Urine YELLOW YELLOW   APPearance CLEAR CLEAR   Specific Gravity, Urine 1.008 1.005 - 1.030   pH 7.5 5.0 - 8.0   Glucose, UA NEGATIVE NEGATIVE mg/dL   Hgb urine dipstick NEGATIVE NEGATIVE   Bilirubin Urine NEGATIVE NEGATIVE   Ketones, ur NEGATIVE NEGATIVE mg/dL   Protein, ur NEGATIVE NEGATIVE mg/dL   Urobilinogen, UA 0.2 0.0 - 1.0 mg/dL   Nitrite NEGATIVE NEGATIVE   Leukocytes, UA NEGATIVE NEGATIVE    Comment: MICROSCOPIC NOT DONE ON URINES WITH NEGATIVE PROTEIN, BLOOD, LEUKOCYTES, NITRITE, OR GLUCOSE <1000 mg/dL.  Drug screen panel, emergency     Status: None   Collection Time: 09/20/14 12:58 AM  Result Value Ref Range   Opiates NONE DETECTED NONE DETECTED   Cocaine NONE DETECTED NONE DETECTED   Benzodiazepines NONE DETECTED NONE DETECTED   Amphetamines NONE DETECTED NONE DETECTED   Tetrahydrocannabinol NONE DETECTED NONE DETECTED   Barbiturates NONE DETECTED NONE DETECTED    Comment:        DRUG SCREEN FOR MEDICAL PURPOSES ONLY.  IF CONFIRMATION IS NEEDED FOR ANY PURPOSE, NOTIFY LAB WITHIN 5 DAYS.        LOWEST DETECTABLE LIMITS FOR URINE DRUG SCREEN Drug Class       Cutoff (ng/mL) Amphetamine      1000 Barbiturate      200 Benzodiazepine   641 Tricyclics       583 Opiates          300 Cocaine          300 THC              50    Psychological Evaluations:  Assessment:   Patient is a 58 year old man, single, lives alone, self employed. He states he was  diagnosed with Bipolar disorder years ago, but had been relatively stable and high functioning for many years. His last admission prior to this one had been in the 1980's.  He was doing well recently and had developed an internet relationship with a woman he had known when younger. She came to visit him, but when she saw him she stated she felt uncomfortable due to him having known her when she was young and cut visit short. Patient states he has had a long term difficulty establishing and maintaining relationship with women, and after this rejection became acutely and severely depressed and attempted to commit suicide by carbon monoxide poisoning, resulting in admission. At this time less depressed, and able to contract for safety, without ongoing suicidal thoughts at present. States he has been stable on psychiatric medications which include Depakote ER , Cymbalta, Lamictal.    AXIS I:  Bipolar Disorder , Depressed  AXIS II:  Deferred AXIS III:   Past Medical History  Diagnosis Date  . Hypertension   . Bipolar disorder   . Gout   . Allergic rhinitis   . Obesity     ,moderate  . Prostatitis     , Episodic  . Mild renal insufficiency     , with creatinine of 1.3 in 2010, was side effect of med  . Bradycardia 2012     due to lithium   AXIS IV:  problems related to social environment and loneliness, recent failed relationship AXIS V:  41-50 serious symptoms  Treatment Plan/Recommendations:  See below   Treatment Plan Summary: Daily contact with patient to assess and evaluate symptoms and progress in treatment Medication management See below Current Medications:  Current Facility-Administered Medications  Medication Dose Route Frequency Provider Last Rate Last Dose  . acetaminophen (TYLENOL) tablet 650 mg  650 mg Oral Q6H PRN Waylan Boga, NP      . alum & mag hydroxide-simeth (MAALOX/MYLANTA) 200-200-20 MG/5ML suspension 30 mL  30 mL Oral Q4H PRN Waylan Boga, NP      . Derrill Memo ON  09/22/2014] amLODipine (NORVASC) tablet 5 mg  5 mg Oral Daily Jenne Campus, MD      . cholecalciferol (VITAMIN D) tablet 2,000 Units  2,000 Units Oral Daily Waylan Boga, NP   2,000 Units at 09/21/14 0858  . colchicine tablet 0.6 mg  0.6 mg Oral Daily Waylan Boga, NP   0.6 mg at 09/21/14 0859  . divalproex (DEPAKOTE ER) 24 hr tablet 1,000 mg  1,000 mg Oral QHS Waylan Boga, NP   1,000 mg at 09/20/14 2357  . DULoxetine (CYMBALTA) DR capsule 120 mg  120 mg Oral QHS Waylan Boga, NP   120 mg at 09/20/14 2357  . febuxostat (ULORIC) tablet 40 mg  40 mg Oral Daily Waylan Boga, NP   40 mg at 09/21/14 1135  . lamoTRIgine (LAMICTAL) tablet 150 mg  150 mg Oral Daily Waylan Boga, NP   150 mg at 09/21/14 0900  . losartan (COZAAR) tablet 50 mg  50 mg Oral Daily Waylan Boga, NP   50 mg at 09/21/14 0830  . magnesium hydroxide (MILK OF MAGNESIA) suspension 30 mL  30 mL Oral Daily PRN Waylan Boga, NP      . traZODone (DESYREL) tablet 50 mg  50 mg Oral QHS PRN Waylan Boga, NP   50 mg at 09/20/14 2358    Observation Level/Precautions:  15 minute checks  Laboratory:  as needed  Psychotherapy:  Group, milieu, supportive   Medications:  Will continue Depakote, Cymbalta, Lamictal, as history of good response and no side effects  Consultations:  If needed   Discharge Concerns:  Limited support network  Estimated LOS: 5-6 days   Other:     I certify that inpatient services furnished can reasonably be expected to improve the patient's condition.   Peace Noyes 11/3/20156:51 PM

## 2014-09-21 NOTE — BHH Counselor (Signed)
Adult Comprehensive Assessment  Patient ID: Edwin [name], male   DOB: 09-19-1956, 58 y.o.   MRN: [id number]  Information Source: Information source: Patient  Current Stressors:  Educational / Learning stressors: None Employment / Job issues: No stressors - patient is self employed Family Relationships: None Surveyor, quantity / Lack of resources (include bankruptcy): None Housing / Lack of housing: None Physical health (include injuries & life threatening diseases): None Social relationships: Patient reports being lonely Substance abuse: None Bereavement / Loss: None  Living/Environment/Situation:  Living Arrangements: Alone Living conditions (as described by patient or guardian): Good How long has patient lived in current situation?: 20 years What is atmosphere in current home: Comfortable  Family History:  Marital status: Single Does patient have children?: No  Childhood History:  By whom was/is the patient raised?: Mother/father and step-parent Additional childhood history information: Patient reports having a wonderful childhood Description of patient's relationship with caregiver when they were a child: Good Patient's description of current relationship with people who raised him/her: Mother has been impaired by a stroke she had last Nov 24, 2023 - Father is deceased Does patient have siblings?: Yes Number of Siblings: 1 Description of patient's current relationship with siblings: Patient and sister love each other but have a distant relationship Did patient suffer any verbal/emotional/physical/sexual abuse as a child?: No Did patient suffer from severe childhood neglect?: No Has patient ever been sexually abused/assaulted/raped as an adolescent or adult?: No Was the patient ever a victim of a crime or a disaster?: No Has patient been effected by domestic violence as an adult?: No  Education:  Highest grade of school patient has completed: Masters Degree Currently a Consulting civil engineer?:  No Learning disability?: No  Employment/Work Situation:   Employment situation: Employed Where is patient 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, occupational and tutoror How long has patient been employed?: 10 years Patient's job has been impacted by current illness: No What is the longest time patient has a held a job?: Ten years Where was the patient employed at that time?: Current employer Has patient ever been in the [employer]?: No Has patient ever served in combat?: No  Financial Resources:   Financial resources: Income from employment Does patient have a representative payee or guardian?: No  Alcohol/Substance Abuse:   What has been your use of drugs/alcohol within the last 12 months?: Patient denies If attempted suicide, did drugs/alcohol play a role in this?: No Alcohol/Substance Abuse Treatment Hx: Denies past history Has alcohol/substance abuse ever caused legal problems?: No  Social Support System:   Forensic psychologist System: None Describe Community Support System: N/A Type of faith/religion: Ephriam Knuckles How does patient's faith help to cope with current illness?: By [employer] there is a reason for what happens and by finding the uniqueness and value in ogthrs  Leisure/Recreation:   Leisure and Hobbies: Patient loves music  Strengths/Needs:   What things does the patient do well?: Teahing and interpersonal skills In what areas does patient struggle / problems for patient: Social life  Discharge Plan:   Does patient have access to transportation?: Yes Will patient be returning to same living situation after discharge?: Yes Currently receiving community mental health services: Yes (From Whom) ([facility]) If no, would patient like referral for services when discharged?: No Does patient have financial barriers related to discharge medications?: No  Summary/Recommendations:  [name] is a 58 years old Caucasian male admitted with Bipolar  Disorder.  He will benefit from crisis stabilization, evaluation for medication, psycho-education groups  for coping skills development, group therapy and case management for discharge planning.     Daja Shuping, Joesph JulyQuylle Hairston. 09/21/2014

## 2014-09-21 NOTE — Plan of Care (Signed)
Problem: Consults Goal: Suicide Risk Patient Education (See Patient Education module for education specifics)  Outcome: Completed/Met Date Met:  09/21/14 Patient will discuss SI feelings/thoughts with staff throughout day.

## 2014-09-21 NOTE — [facility] Suicide Risk Assessment (Signed)
   Nursing information obtained from:  Patient Demographic factors:  Male Current Mental Status:  Suicide plan, Intention to act on suicide plan, Belief that plan would result in death, Self-harm behaviors Loss Factors:  Loss of significant relationship Historical Factors:  Family history of mental illness or substance abuse Risk Reduction Factors:  Employed Total Time spent with patient: 45 minutes  CLINICAL FACTORS:  Recent severe depression and suicide attempt  Psychiatric Specialty Exam: Physical Exam  ROS  Blood pressure 132/85, pulse 83, temperature 98.2 F (36.8 C), temperature source Oral, resp. rate 16, height 5\' 11"  (1.803 m), weight 82.101 kg (181 lb), SpO2 100 %.Body mass index is 25.26 kg/(m^2).  SEE ADMIT NOTE MSE   COGNITIVE FEATURES THAT CONTRIBUTE TO RISK:  Closed-mindedness    SUICIDE RISK:   Moderate:  Frequent suicidal ideation with limited intensity, and duration, some specificity in terms of plans, no associated intent, good self-control, limited dysphoria/symptomatology, some risk factors present, and identifiable protective factors, including available and accessible social support.  PLAN OF CARE: Patient will be admitted to inpatient psychiatric unit for stabilization and safety. Will provide and encourage milieu participation. Provide medication management and maked adjustments as needed.  Will follow daily.    I certify that inpatient services furnished can reasonably be expected to improve the patient's condition.  COBOS, FERNANDO 09/21/2014, 7:18 PM

## 2014-09-21 NOTE — Progress Notes (Signed)
Patient ID: Edwin [name], male   DOB: 06-06-1956, 58 y.o.   MRN: [id number] Admission note: D:Patient is a voluntary admission in no acute distress for increased depression and anxiety. Pt report hx of depression since age 66. Pt reports stressor as not able to formed a significant relationship. From report pt was found in his car trying to exhale exhaust fumes from a car. Pt reports he stopped taking all his medication for 3 days, has not bath or shaved. Pt currently denies suicidal ideation band contracts for safety. Pt reports hx of bipolar. Pt denies SI/HI/AV  A: Pt admitted to unit per protocol, skin assessment and belonging search done. No skin issues noted. Consent signed by pt. Pt educated on therapeutic milieu rules. Pt was introduced to milieu by nursing staff. Fall risk safety plan explained to the patient. 15 minutes checks started for safety.  R: Pt was receptive to education. Writer offered support.

## 2014-09-21 NOTE — Tx Team (Signed)
Interdisciplinary Treatment Plan Update   Date Reviewed:  09/21/2014  Time Reviewed:  8:32 AM  Progress in Treatment:   Attending groups: Yes Participating in groups: Yes Taking medication as prescribed: Yes  Tolerating medication: Yes Family/Significant other contact made:  No, but will ask patient for consent for collateral contact Patient understands diagnosis: Yes  Discussing patient identified problems/goals with staff: Yes Medical problems stabilized or resolved: Yes Denies suicidal/homicidal ideation: Yes Patient has not harmed self or others: Yes  For review of initial/current patient goals, please see plan of care.  Estimated Length of Stay:  2-4 days  Reasons for Continued Hospitalization:  Anxiety Depression Medication stabilization   New Problems/Goals identified:    Discharge Plan or Barriers:   Home with outpatient follow up to be determined  Additional Comments:  Edwin [name] y.o. male [facility] after a suicide attempt. Pt stated "I am not ready to talk about it". It has been reported that [place]  Pt denies SI at this time but reported that earlier today he had thoughts to kill himself. Pt stated "I rather not talk about it". When pt was asked about previous suicide attempts he stated "I rather not talk about it". Pt reported that he has been hospitalized in the past and is currently seeing a psychiatrist and a therapist. Pt reported he has been diagnosed with depression since the age Edwin [name] but the onset was age Edwin [name]. Pt stated "I had plenty of time to destroy everything". Pt is endorsing some depressive symptoms but when asked about stressor he stated "It's hard to identify". Pt also stated "I don't know, I've experienced it for so long and for so many years" when he was asked about feeling hopeless. Pt did not report any issues with his appetite or sleep. Pt reported that he has been staying in the bed for the  past 2 days and has not showered nor shaved. Pt did not report any HI or AVH at this time. Pt denied any illicit substance abuse or alcohol use. Pt denied having access to weapons or firearms and did not report any pending criminal charges at this time. Pt did not report any physical, sexual or emotional abuse at this time.   Patient and CSW will meet to identify goals and treatment plan.    Attendees:  Patient:  09/21/2014 8:32 AM   Signature:  Sallyanne HaversF. Cobos, MD 09/21/2014 8:32 AM  Signature: Geoffery LyonsIrving Lugo, MD 09/21/2014 8:32 AM  Signature:  [place] Minersonna Shimp, RN 09/21/2014 8:32 AM  Signature:   09/21/2014 8:32 AM  Signature:  Leighton ParodyBritney Tyson, RN 09/21/2014 8:32 AM  Signature:  Juline PatchQuylle Keyari Kleeman, LCSW 09/21/2014 8:32 AM  Signature:  Belenda CruiseKristin Drinkard, LCSW-A 09/21/2014 8:32 AM  Signature:  Leisa LenzValerie Enoch, Care Coordinator [facility] 09/21/2014 8:32 AM  Signature: Quintella ReichertBeverly Knight, RN 09/21/2014 8:32 AM  Signature:  09/21/2014  8:32 AM  Signature:   Onnie BoerJennifer Clark, RN [facility] 09/21/2014  8:32 AM  Signature:  Santa GeneraAnne Cunningham Lead Social Worker LCSW 09/21/2014  8:32 AM    Scribe for Treatment Team:   [place] EnergyQuylle Lashunta Frieden,  09/21/2014 8:32 AM

## 2014-09-21 NOTE — Progress Notes (Signed)
Recreation Therapy Notes  Animal-Assisted Activity/Therapy (AAA/T) Program Checklist/Progress Notes Patient Eligibility Criteria Checklist & Daily Group note for Rec Tx Intervention  Date: 11.03.2015 Time: 2:45pm Location: [address] Dayroom   AAA/T Program Assumption of Risk Form signed by Patient/ or Parent Legal Guardian yes  Patient is free of allergies or sever asthma yes  Patient reports no fear of animals yes  Patient reports no history of cruelty to animals yes   Patient understands his/her participation is voluntary yes  Patient washes hands before animal contact yes  Patient washes hands after animal contact yes  Behavioral Response: Engaged, Appropriate   Education: Charity fundraiser, Appropriate Animal Interaction   Education Outcome: Acknowledges education.   Clinical Observations/Feedback: Patient actively engaged with therapy dog, petting him appropriately from floor level and engaged with peers sharing stories about his cats at home. Patient disclosures sparked questions about the breed of cat he has. Patient address peer questions without hesitation and brightened while doing so. Patient asked appropriate questions about therapy dog and his training.   Edwin [name], LRT/CTRS  Edwin [name] 09/21/2014 4:30 PM

## 2014-09-21 NOTE — [facility] Group Notes (Signed)
The focus of this group is to educate the patient on the purpose and policies of crisis stabilization and provide a format to answer questions about their admission.  The group details unit policies and expectations of patients while admitted.  Patient did not attend 0900 nurse education orientation group this morning.  Patient stayed in his room. 

## 2014-09-21 NOTE — [facility] Group Notes (Signed)
[facility] LCSW Group Therapy      Feelings About Diagnosis 1:15 - 2:30 PM         09/21/2014    Type of Therapy:  Group Therapy  Participation Level:  Active  Participation Quality:  Appropriate  Affect:  Depressed  Cognitive:  Alert and Appropriate  Insight:  Developing/Improving and Engaged  Engagement in Therapy:  Developing/Improving and Engaged  Modes of Intervention:  Discussion, Education, Exploration, Problem-Solving, Rapport Building, Support  Summary of Progress/Problems:  Patient actively participated in group. Patient discussed past and present diagnosis and the effects it has had on  life.  Patient talked about family and society being judgmental and the stigma associated with having a mental health diagnosis. Patient shared he accepts his diagnosis and he also has forgiven himself for acts or wrongs he did prior to knowing he was Bipolar.  Wynn Banker 09/21/2014

## 2014-09-21 NOTE — Progress Notes (Signed)
Patient ID: Edwin [name], male   DOB: 06-18-1956, 58 y.o.   MRN: [id number] Received report from Carlena Hurl.    D: Client appropriate, has concerns about getting home medications. A: Writer introduced self to client, reviewed medications, explained to client he would see the physician in the morning who will assess him and review home medications for approval. Client made aware that he would be receiving medications for sleep tonight. Staff will monitor q30min for safety. R: Client is safe on the unit.

## 2014-09-21 NOTE — Progress Notes (Signed)
Adult Psychoeducational Group Note  Date:  09/21/2014 Time:  11:27 PM  Group Topic/Focus:  Wrap-Up Group:   The focus of this group is to help patients review their daily goal of treatment and discuss progress on daily workbooks.  Participation Level:  Active  Participation Quality:  Appropriate, Attentive, Sharing and Supportive  Affect:  Appropriate  Cognitive:  Appropriate  Insight: Good  Engagement in Group:  Supportive  Modes of Intervention:  Support  Additional Comments:  Pt attended wrap up group this evening.  He reports learning a coping skill over the phone from a friend about not burying himself into work to avoid interacting with people.  He reports seeking other activities upon discharge as coping skills including attending church with others for support.  Aundria RudWILKINSON, Kimbrely Buckel L 09/21/2014, 11:27 PM

## 2014-09-21 NOTE — Progress Notes (Signed)
MEDICATION RELATED CONSULT NOTE - FOLLOW UP   Pharmacy: Medication management   Medications:  Scheduled:  . [START ON 09/22/2014] amLODipine  5 mg Oral Daily  . cholecalciferol  2,000 Units Oral Daily  . colchicine  0.6 mg Oral Daily  . divalproex  1,000 mg Oral QHS  . DULoxetine  120 mg Oral QHS  . febuxostat  40 mg Oral Daily  . lamoTRIgine  150 mg Oral Daily  . losartan  50 mg Oral Daily    Assessment: Patient started on lisinopril in ED.  Pt home med is losartan.  Recommend increase of Norvasc instead of lisinopril to MD.   Plan:  DC lisinopril Increase Norvasc to 5 mg daily for increased BP   Charyl Dancer 09/21/2014,11:44 AM

## 2014-09-21 NOTE — Progress Notes (Signed)
Pt reports he is feeling better today.  He feels his meds are helping him.  Per pt, he is not as anxious as he was when he went to the ED.  He had a pleasant visit with his sister and brother in law who took his car keys from the locker with pt's permission so they could move it to their home.  Pt voices no needs or concerns at this time.  Pt denies SI/HI/AV at this time.  Support and encouragement offered.  Safety maintained with q15 minute checks.

## 2014-09-21 NOTE — Progress Notes (Signed)
D:  Patient's self inventory sheet, patient slept good last night, sleep medication was helpful.  Good appetite, normal energy level, good concentration.  Rated depression 4, hopeless 3, anxiety 1.  Denied withdrawals.  Denied SI, contracts for safety.  Physical problems in past 24 hours, felt lightheaded.  Denied physical pain.  Goal is to participate and be open in finding something helpful in each of the activities; to take care of household logistics.  Does have discharge plans.  No problems anticipated after discharge. A:  Norvasc, lisinopril and cozaar not given to patient this morning because of low BP.   Medications discussed with charge nurse and MD. R:  Denied SI and HI, contracts for safety.  Denied A/V hallucinations.  Safety maintained with 15 minute checks. Patient stated he was taking L-carnitine 500 mg every morning and every evening.

## 2014-09-21 NOTE — [facility] Suicide Risk Assessment (Signed)
[facility] INPATIENT:  Family/Significant Other Suicide Prevention Education  Suicide Prevention Education:  Patient Refusal for Family/Significant Other Suicide Prevention Education: The patient [name] has refused to provide written consent for family/significant other to be provided Family/Significant Other Suicide Prevention Education during admission and/or prior to discharge.  Physician notified.  Wynn BankerHodnett, Kyjuan Gause Hairston 09/21/2014, 12:54 PM

## 2014-09-22 LAB — VALPROIC ACID LEVEL: Valproic Acid Lvl: <10 ug/mL — ABNORMAL LOW (ref 50.0–100.0)

## 2014-09-22 NOTE — [facility] Group Notes (Signed)
[facility] LCSW Group Therapy  Emotional Regulation 1:15 - 2: 30 PM        09/22/2014     Type of Therapy:  Group Therapy  Participation Level:  Appropriate  Participation Quality:  Appropriate  Affect:  Appropriate  Cognitive:  Attentive Appropriate  Insight:  Developing/Improving Engaged  Engagement in Therapy:  Developing/Improving Engaged  Modes of Intervention:  Discussion Exploration Problem-Solving Supportive  Summary of Progress/Problems:  Group topic was emotional regulations.  Patient participated in the discussion and was able to identify an emotion that needed to regulated.  He shared fear is the emotion he needs to better control.  He talked about how it has almost felt as though he were paralyzed by things that have happened in life.  Patient was able to identify approprite coping skills.  Wynn BankerHodnett, Abanoub Hanken Hairston 09/22/2014

## 2014-09-22 NOTE — [facility] Group Notes (Signed)
Adult Psychoeducational Group Note  Date:  09/22/2014 Time:  10:13 PM  Group Topic/Focus:  NA Meeting  Participation Level:  Did Not Attend  Participation Quality:  None  Affect:  None  Cognitive:  None  Insight: None  Engagement in Group:  None  Modes of Intervention:  Discussion and Education  Additional Comments:  Edwin [name] did not attend group.  Caroll Rancher A 09/22/2014, 10:13 PM

## 2014-09-22 NOTE — Progress Notes (Signed)
[facility] MD Progress Note  09/22/2014 12:41 PM Edwin [name]  MRN:  [id number] Subjective:  Patient states he is feeling better. He states " I was doing really well. It really surprises me I got sick so quick, but now I am all right" He reports  A visit from his sister yesterday , which was very meaningful for him and " made me feel much better". He also has phone calls from concerned friends. Objective: I have discussed case with treatment team and have met with patient. Report from staff is that patient is doing well at this time, going to groups , active in milieu, cooperative upon approach, and exhibiting improving mood and range of affect. Patient states that he has " bounced back quickly" and that he feels like himself again. He is less ruminative or concerned about recent failed relationship. 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 Behavior on unit in good control. Has denied any suicidal ideations and has not presented with any self injurious behaviors. He denies any medication side effects. Valproic Acid level was subtherapeutic, which may suggest limited compliance with medication. Diagnosis:  Bipolar Disorder- Depressed   Total Time spent with patient: 25 minutes     ADL's:fair , but improved compared to admission  Sleep: fair  Appetite:  Good   Suicidal Ideation:  Denies any current suicidal ideations Homicidal Ideation:  Denies any homicidal ideations AEB (as evidenced by):  Psychiatric Specialty Exam: Physical Exam  Review of Systems  Constitutional: Negative for fever and chills.  Respiratory: Negative for cough and shortness of breath.   Gastrointestinal: Negative for nausea, vomiting, abdominal pain, blood in stool and melena.  Genitourinary: Negative for dysuria, urgency and frequency.  Skin: Negative for itching and rash.   Neurological: Negative for seizures, loss of consciousness and headaches.  Psychiatric/Behavioral: Positive for depression.    Blood pressure 92/58, pulse 100, temperature 98.5 F (36.9 C), temperature source Oral, resp. rate 16, height '5\' 11"'  (1.803 m), weight 82.101 kg (181 lb), SpO2 100 %.Body mass index is 25.26 kg/(m^2).  General Appearance: Fairly Groomed  Engineer, water::  Good  Speech:  Normal Rate  Volume:  Normal  Mood:  less depressed, improving   Affect:  Appropriate and not constricted today, reactive  Thought Process:  Goal Directed and Linear  Orientation:  Full (Time, Place, and Person)  Thought Content:  denies hallucinations,no delusions expressed, less ruminative about his stressors  Suicidal Thoughts:  No- at this time denies any ongoing suicidal ideations, and is denying and violent or  homicidal ideations  Homicidal Thoughts:  No  Memory:  recent and remote grossly intact   Judgement:  Fair  Insight:  Present  Psychomotor Activity:  Normal  Concentration:  Good  Recall:  Good  Fund of Knowledge:Good  Language: Good  Akathisia:  Negative  Handed:  Left  AIMS (if indicated):     Assets:  Communication Skills Desire for Improvement Physical Health Resilience Talents/Skills  Sleep:  Number of Hours: 4.25   Musculoskeletal: Strength & Muscle Tone: within normal limits Gait & Station: normal Patient leans: N/A  Current Medications: Current Facility-Administered Medications  Medication Dose Route Frequency Provider Last Rate Last Dose  . acetaminophen (TYLENOL) tablet 650 mg  650 mg Oral Q6H PRN Waylan Boga, NP      .  alum & mag hydroxide-simeth (MAALOX/MYLANTA) 200-200-20 MG/5ML suspension 30 mL  30 mL Oral Q4H PRN Waylan Boga, NP      . amLODipine (NORVASC) tablet 5 mg  5 mg Oral Daily Jenne Campus, MD   5 mg at 09/22/14 9179  . cholecalciferol (VITAMIN D) tablet 2,000 Units  2,000 Units Oral Daily Waylan Boga, NP   2,000 Units at 09/22/14 [phone]  .  colchicine tablet 0.6 mg  0.6 mg Oral Daily Waylan Boga, NP   0.6 mg at 09/22/14 6979  . divalproex (DEPAKOTE ER) 24 hr tablet 1,000 mg  1,000 mg Oral QHS Waylan Boga, NP   1,000 mg at 09/21/14 2129  . DULoxetine (CYMBALTA) DR capsule 120 mg  120 mg Oral QHS Waylan Boga, NP   120 mg at 09/21/14 2129  . febuxostat (ULORIC) tablet 40 mg  40 mg Oral Daily Waylan Boga, NP   40 mg at 09/22/14 0833  . lamoTRIgine (LAMICTAL) tablet 150 mg  150 mg Oral Daily Waylan Boga, NP   150 mg at 09/22/14 4801  . losartan (COZAAR) tablet 50 mg  50 mg Oral Daily Waylan Boga, NP   50 mg at 09/22/14 6553  . magnesium hydroxide (MILK OF MAGNESIA) suspension 30 mL  30 mL Oral Daily PRN Waylan Boga, NP      . traZODone (DESYREL) tablet 50 mg  50 mg Oral QHS PRN Waylan Boga, NP   50 mg at 09/21/14 2131    Lab Results:  Results for orders placed or performed during the hospital encounter of 09/20/14 (from the past 48 hour(s))  Valproic acid level     Status: Abnormal   Collection Time: 09/22/14  6:30 AM  Result Value Ref Range   Valproic Acid Lvl <10.0 (L) 50.0 - 100.0 ug/mL    Comment: Performed at [facility]    Physical Findings: AIMS: Facial and Oral Movements Muscles of Facial Expression: None, normal Lips and Perioral Area: None, normal Jaw: None, normal Tongue: None, normal,Extremity Movements Upper (arms, wrists, hands, fingers): None, normal Lower (legs, knees, ankles, toes): None, normal, Trunk Movements Neck, shoulders, hips: None, normal, Overall Severity Severity of abnormal movements (highest score from questions above): None, normal Incapacitation due to abnormal movements: None, normal Patient's awareness of abnormal movements (rate only patient's report): No Awareness, Dental Status Current problems with teeth and/or dentures?: No Does patient usually wear dentures?: No  CIWA:  CIWA-Ar Total: 1 COWS:  COWS Total Score: 1   Assessment:  Patient is much improved compared to  his admission status. He is less focused about recent stressor, presents with improved mood, fuller range of affect and is  more future oriented, stating that he wants to slow down on his weekend work so her start going to church again. At this time no SI. Tolerating medications well- valproic acid serum level was low upon admission  Treatment Plan Summary: Daily contact with patient to assess and evaluate symptoms and progress in treatment Medication management See below  Plan: Continue inpatient treatment and support. Continue milieu/ group therapy. Continue  Depakote ER 1000 mgrs QHS Continue Cymbalta 120 mgrs QHS Continue Lamictal 150 mgrs QDAY  Treatment Team/SW working on disposition planning as patient continues to stabilize in unit.  Medical Decision Making Problem Points:  Established problem, stable/improving (1), Review of last therapy session (1) and Review of psycho-social stressors (1) Data Points:  Review or order clinical lab tests (1) Review of medication regiment & side effects (2)  I  certify that inpatient services furnished can reasonably be expected to improve the patient's condition.   Jerrod Damiano, [place] 09/22/2014, 12:41 PM

## 2014-09-22 NOTE — Plan of Care (Signed)
Problem: Diagnosis: Increased Risk For Suicide Attempt Goal: STG-Patient Will Report Suicidal Feelings to Staff Outcome: Progressing At this time, pt denies SI to staff, but agrees to notify staff if he has any thoughts of self harm.

## 2014-09-22 NOTE — [facility] Group Notes (Signed)
[facility]   09/22/2014 10:51 AM    Participation Quality:  Appropraite  Mood/Affect:  Appropriate  Depression Rating:  3  Anxiety Rating:  0  Thoughts of Suicide:  No  Will you contract for safety?   NA  Current AVH:  No  Plan for Discharge/Comments:  Patient attended discharge planning group and actively participated in group. He reports feeling much better today and hopes to discharge home soon.  He will follow up with [facility] for outpatient counseling.  Suicide prevention education reviewed and SPE document provided.   Transportation Means: Patient has transportation.   Supports:  Patient has a support system.   Jaala Bohle, Joesph JulyQuylle Hairston

## 2014-09-22 NOTE — Progress Notes (Signed)
Adult Psychoeducational Group Note  Date:  09/22/2014 Time:  6:56 PM  Group Topic/Focus:  Wellness Toolbox:   The focus of this group is to discuss various aspects of wellness, balancing those aspects and exploring ways to increase the ability to experience wellness.  Patients will create a wellness toolbox for use upon discharge.  Participation Level:  Active  Participation Quality:  Appropriate  Affect:  Appropriate  Cognitive:  Appropriate  Insight: Appropriate  Engagement in Group:  Engaged  Modes of Intervention:  Education  Additional Comments:  Patient stated a positive change he would like to start going to church to build a sense of community and start working less. Patient stated he likes his work and enjoys making a difference but feels like he was working too much to prevent himself from being lonely.  Merleen Milliner 09/22/2014, 6:56 PM

## 2014-09-22 NOTE — Progress Notes (Signed)
D: Patient appropriate and cooperative with staff and peers. Patient has depressed affect and mood. He reported on the self inventory sheet that his sleep, appetite, and ability to concentrate are good and energy level is normal. Patient voiced that he's feeling a lot better and more like himself again. He rates depression today "2", feelings of hopelessness "1" and anxiety "0". In compliance with medication regimen.  A: Support and encouragement provided to patient. Scheduled medications administered per MD orders. Maintain Q15 minute checks for safety.   R: Patient receptive. Denies SI/HI and auditory/visual hallucinations. Patient remains safe on the hall.

## 2014-09-22 NOTE — Progress Notes (Signed)
Pt attended spiritual care group on grief and loss facilitated by counseling intern [place] and Burnis Kingfisher. Group opened with brief discussion and psycho-social ed around grief and loss in relationships and in relation to self - identifying life patterns, circumstances, changes that cause losses. Established group norm of speaking from own life experience. Group goal of establishing open and affirming space for members to share loss and experience with grief, normalize grief experience and provide psycho social education and grief support.  Edwin [name] was present, appropriate, engaged in group. Edwin [name] shared how his diagnosis and mental health problems had taken away so much, particularly with relationships. Edwin [name] often wanted to foster relationships but found it difficult to do so until recently. Edwin [name] felt as if grief is universal but at the same time isolating because everyone experiences grief differently. Edwin [name] and Edwin [name] joined together on this and found it to be therapeutic to have people to listen. Edwin [name] disclosed he experienced a recent loss that he responded ineffectively due to cumulative grief, but hopes to respond more rationally in the future. Edwin [name] volunteered that the room felt like a safe place to share.   [place] Counseling Intern

## 2014-09-22 NOTE — Plan of Care (Signed)
Problem: Alteration in mood Goal: LTG-Patient reports reduction in suicidal thoughts (Patient reports reduction in suicidal thoughts and is able to verbalize a safety plan for whenever patient is feeling suicidal)  Outcome: Progressing Pt denies suicidal thoughts to staff.

## 2014-09-23 MED ORDER — FEBUXOSTAT 40 MG PO TABS: 40.0000 mg | ORAL_TABLET | Freq: Every day | ORAL | Status: DC

## 2014-09-23 MED ORDER — DULOXETINE HCL 60 MG PO CPEP: 120.0000 mg | ORAL_CAPSULE | Freq: Every day | ORAL | Status: DC

## 2014-09-23 MED ORDER — AMLODIPINE BESYLATE 5 MG PO TABS: 5.0000 mg | ORAL_TABLET | Freq: Every day | ORAL | Status: DC

## 2014-09-23 MED ORDER — LAMOTRIGINE 100 MG PO TABS
150.0000 mg | ORAL_TABLET | Freq: Every day | ORAL | Status: DC
  Filled 2014-09-23: qty 21
  Filled 2014-09-23: qty 1.5

## 2014-09-23 MED ORDER — LAMOTRIGINE 150 MG PO TABS: 150.0000 mg | ORAL_TABLET | Freq: Every day | ORAL | Status: DC

## 2014-09-23 MED ORDER — VITAMIN D3 25 MCG (1000 UNIT) PO TABS: 2000.0000 [IU] | ORAL_TABLET | Freq: Every day | ORAL | Status: DC

## 2014-09-23 MED ORDER — DIVALPROEX SODIUM ER 500 MG PO TB24: 1000.0000 mg | ORAL_TABLET | Freq: Every day | ORAL | Status: DC

## 2014-09-23 MED ORDER — LOSARTAN POTASSIUM 50 MG PO TABS: 50.0000 mg | ORAL_TABLET | Freq: Every day | ORAL | Status: DC

## 2014-09-23 MED ORDER — COLCHICINE 0.6 MG PO TABS: 0.6000 mg | ORAL_TABLET | Freq: Every day | ORAL | Status: DC

## 2014-09-23 NOTE — [facility] Suicide Risk Assessment (Signed)
Demographic Factors:  58 year old male, single, no children, employed  Total Time spent with patient: 30 minutes  Psychiatric Specialty Exam: Physical Exam  ROS  Blood pressure 112/83, pulse 96, temperature 98.2 F (36.8 C), temperature source Oral, resp. rate 18, height 5\' 11"  (1.803 m), weight 82.101 kg (181 lb), SpO2 100 %.Body mass index is 25.26 kg/(m^2).  General Appearance: improved grooming  Eye Contact::  Good  Speech:  Normal Rate  Volume:  Normal  Mood:  improved, at this time he is not feeling depressed  Affect:  Appropriate- reactive   Thought Process:  Goal Directed and Linear  Orientation:  Full (Time, Place, and Person)  Thought Content:  denies hallucinations, no delusions , less ruminative  Suicidal Thoughts:  No- at this time not suicidal or homicidal and denies any self injurious ideations  Homicidal Thoughts:  No  Memory:  recent and remote grossly intact   Judgement:  Good  Insight:  Good  Psychomotor Activity:  Normal  Concentration:  Good  Recall:  Good  Fund of Knowledge:Good  Language: Good  Akathisia:  Negative  Handed:  Left  AIMS (if indicated):     Assets:  Communication Skills Desire for Improvement Housing Resilience Talents/Skills  Sleep:  Number of Hours: 6.25    Musculoskeletal: Strength & Muscle Tone: within normal limits Gait & Station: normal Patient leans: N/A   Mental Status Per Nursing Assessment::   On Admission:  Suicide plan, Intention to act on suicide plan, Belief that plan would result in death, Self-harm behaviors  Current Mental Status by Physician: At this time patient is improved compared to admission status- mood much improved and today denies feeling depressed, affect more reactive, no thought disorder, no SI or HI, future oriented and 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 No psychotic symptoms  Loss Factors: loneliness has been a chronic issue  for patient. Recent relationship with male did not progress as expected.  Historical Factors: history of bipolar disorder, but no psychiatric admissions in many years ( late 70's)   Risk Reduction Factors:   Sense of responsibility to family, Positive social support and Positive coping skills or problem solving skills  Continued Clinical Symptoms:  At this time mood and affect much improved and patient no longer feeling depressed. No suicidal ideations, future oriented. Tolerating medications well.  Cognitive Features That Contribute To Risk:  No gross cognitive deficits noted upon discharge- he is alert, attentive, oriented x 3, and has good insight into illness   Suicide Risk:  Mild:  Suicidal ideation of limited frequency, intensity, duration, and specificity.  There are no identifiable plans, no associated intent, mild dysphoria and related symptoms, good self-control (both objective and subjective assessment), few other risk factors, and identifiable protective factors, including available and accessible social support.  Discharge Diagnoses:   AXIS I: Bipolar Disorder, Depressed  AXIS II:  Deferred AXIS III:   Past Medical History  Diagnosis Date  . Hypertension   . Bipolar disorder   . Gout   . Allergic rhinitis   . Obesity     ,moderate  . Prostatitis     , Episodic  . Mild renal insufficiency     , with creatinine of 1.3 in 2010, was side effect of med  . Bradycardia 2012     due to lithium   AXIS IV:  relationship issues . Does have good support from family AXIS V:  61-70 mild symptoms  Plan Of Care/Follow-up recommendations:  Activity:  As tolerated Diet:  Low sodium- heart healthy Tests:  NA Other:  See below  Is patient on multiple antipsychotic therapies at discharge:  No   Has Patient had three or more failed trials of antipsychotic monotherapy by history:  No  Recommended Plan for Multiple Antipsychotic Therapies: NA  Patient is leaving unit in  good spirits. He is planning on returning home. He is planning to follow up with his outpatient psychiatrist, Dr. Marc Morgansoddle- 09/28/14, and with his outpatient therapist , Mr. Merlyn AlbertFred May - 09/30/14. He has an established PCP for medical management as needed   COBOS, FERNANDO 09/23/2014, 2:37 PM

## 2014-09-23 NOTE — Progress Notes (Signed)
[facility] Adult Case Management Discharge Plan :  Will you be returning to the same living situation after discharge: Yes,  Patient is returning to his home. At discharge, do you have transportation home?:Yes,  Patient arranged transportation home. Do you have the ability to pay for your medications:No. Patient assisted with indigent medications.  Release of information consent forms completed and in the chart;  Patient's signature needed at discharge.  Patient to Follow up at: Follow-up Information    Follow up with Dr. Jennelle Human On 09/28/2014.   Why:  Tuesday, September 28, 2014   Contact information:   [address]  [place], [place]   [address]  [phone]      Follow up with Merlyn Albert May On 09/30/2014.   Contact information:   [address] [place], [place]  [phone]      Patient denies SI/HI:  Patient no longer endorsing SI/HI or other thoughts of self harm.  Safety Planning and Suicide Prevention discussed: .Reviewed with all patients during discharge planning group   Drew Herman, Joesph July 09/23/2014, 3:52 PM

## 2014-09-23 NOTE — Discharge Summary (Signed)
Physician Discharge Summary Note  Patient:  Edwin [name] is an 58 y.o., male MRN:  [id number] DOB:  Jun 20, 1956 Patient phone:  [phone] (home)  Patient address:   [address] [place] [address],  Total Time spent with patient: 45 minutes  Date of Admission:  09/20/2014 Date of Discharge: 09/23/2014  Reason for Admission:  MDD with SI with plan to poison self with CO2  Discharge Diagnoses: Active Problems:   Bipolar affective disorder, depressed, severe   Psychiatric Specialty Exam: Physical Exam  Review of Systems  Constitutional: Negative.   HENT: Negative.   Eyes: Negative.   Respiratory: Negative.   Cardiovascular: Negative.   Gastrointestinal: Negative.   Genitourinary: Negative.   Musculoskeletal: Negative.   Skin: Negative.   Neurological: Negative.   Endo/Heme/Allergies: Negative.   Psychiatric/Behavioral: Positive for depression. Negative for substance abuse. The patient is nervous/anxious.     Blood pressure 112/83, pulse 96, temperature 98.2 F (36.8 C), temperature source Oral, resp. rate 18, height 5\' 11"  (1.803 m), weight 82.101 kg (181 lb), SpO2 100 %.Body mass index is 25.26 kg/(m^2).   SEE MD PSE in Suicide Risk Assessment  Past Psychiatric History: SEE H&P  Musculoskeletal: Strength & Muscle Tone: within normal limits Gait & Station: normal Patient leans: N/A  DSM5: Depressive Disorders:  Major Depressive Disorder - Severe (296.23)  Axis Diagnosis:   AXIS I:  Bipolar, Depressed AXIS II:  Deferred AXIS III:   Past Medical History  Diagnosis Date  . Hypertension   . Bipolar disorder   . Gout   . Allergic rhinitis   . Obesity     ,moderate  . Prostatitis     , Episodic  . Mild renal insufficiency     , with creatinine of 1.3 in 2010, was side effect of med  . Bradycardia 2012     due to lithium   AXIS IV:  other psychosocial or environmental problems and problems related to social environment AXIS V:  51-60  moderate symptoms  Level of Care:  OP  Hospital Course:  Patient is a 58 year old man, who states he has a history of bipolar disorder . He states he had been doing well for a long time. He had been relatively stable and able to function well in daily activities.He states he has a long history of having some difficulties in his establishing and maintaining relationships, particularly with women.He had been interacting via internet, with a woman he had known In the past. After many years, she had contacted him and they had a friendship via internet. They agreed to meet and she came to see him. He states she felt " uncomfortable because she had known me when she was young, and Did not continue visit.After this he felt acutely depressed and very upset " that I cannot have a normal relationship with a woman for some reason". He became acutely suicidal and [place] self [place] he tried to poison self with CO by leaving car running- somebody noticed and alerted authorities- he was brought to ED. States that before the above stressor he was not particularly depressed and was not manic or hypomanic either.   During Hospitalization: Medications managed, psychoeducation, group and individual therapy. Pt currently denies SI, HI, and Psychosis. At discharge, pt rates anxiety and depression as moderate. Pt states that he does have a good supportive home environment and will followup with outpatient treatment on November 10th and 12th as listed below in this summary. Affirms  agreement with medication regimen and discharge plan. Denies other physical and psychological concerns at time of discharge.    Consults:  None  Significant Diagnostic Studies:  None  Discharge Vitals:   Blood pressure 112/83, pulse 96, temperature 98.2 F (36.8 C), temperature source Oral, resp. rate 18, height 5\' 11"  (1.803 m), weight 82.101 kg (181 lb), SpO2 100 %. Body mass index is 25.26 kg/(m^2). Lab Results:   Results for  orders placed or performed during the hospital encounter of 09/20/14 (from the past 72 hour(s))  Valproic acid level     Status: Abnormal   Collection Time: 09/22/14  6:30 AM  Result Value Ref Range   Valproic Acid Lvl <10.0 (L) 50.0 - 100.0 ug/mL    Comment: Performed at [facility]    Physical Findings: AIMS: Facial and Oral Movements Muscles of Facial Expression: None, normal Lips and Perioral Area: None, normal Jaw: None, normal Tongue: None, normal,Extremity Movements Upper (arms, wrists, hands, fingers): None, normal Lower (legs, knees, ankles, toes): None, normal, Trunk Movements Neck, shoulders, hips: None, normal, Overall Severity Severity of abnormal movements (highest score from questions above): None, normal Incapacitation due to abnormal movements: None, normal Patient's awareness of abnormal movements (rate only patient's report): No Awareness, Dental Status Current problems with teeth and/or dentures?: No Does patient usually wear dentures?: No  CIWA:  CIWA-Ar Total: 1 COWS:  COWS Total Score: 1  Psychiatric Specialty Exam: See Psychiatric Specialty Exam and Suicide Risk Assessment completed by Attending Physician prior to discharge.  Discharge destination:  Home  Is patient on multiple antipsychotic therapies at discharge:  No   Has Patient had three or more failed trials of antipsychotic monotherapy by history:  No  Recommended Plan for Multiple Antipsychotic Therapies: NA     Medication List    STOP taking these medications        L-Carnitine 250 MG Tabs      TAKE these medications      Indication   amLODipine 5 MG tablet  Commonly known as:  NORVASC  Take 1 tablet (5 mg total) by mouth daily.   Indication:  High Blood Pressure     cholecalciferol 1000 UNITS tablet  Commonly known as:  VITAMIN D  Take 2 tablets (2,000 Units total) by mouth daily.   Indication:  Health Maintenance     colchicine 0.6 MG tablet  Commonly known as:   COLCRYS  Take 1 tablet (0.6 mg total) by mouth daily.   Indication:  Gout     divalproex 500 MG 24 hr tablet  Commonly known as:  DEPAKOTE ER  Take 2 tablets (1,000 mg total) by mouth at bedtime. Take total of 1,000 mg total at bedtime.   Indication:  Mood Stabilization     DULoxetine 60 MG capsule  Commonly known as:  CYMBALTA  Take 2 capsules (120 mg total) by mouth at bedtime.   Indication:  Major Depressive Disorder     febuxostat 40 MG tablet  Commonly known as:  ULORIC  Take 1 tablet (40 mg total) by mouth daily.   Indication:  High Amount of Uric Acid in the Blood     lamoTRIgine 150 MG tablet  Commonly known as:  LAMICTAL  Take 1 tablet (150 mg total) by mouth daily.   Indication:  Mood Stabilization     losartan 50 MG tablet  Commonly known as:  COZAAR  Take 1 tablet (50 mg total) by mouth daily.  Follow-up Information    Follow up with Dr. Jennelle Humanottle - [facility] On 09/28/2014.   Why:  You are scheduled with Dr. Jennelle Humanottle on Tuesday, September 28, 2014 at 2:00 PM   Contact information:   [address] [place], [place]   [id number]  [phone]      Follow up with Merlyn AlbertFred May On 09/30/2014.   Why:  You are scheduled with Sherron MondayFred May at [facility]10AM on Thursday, September 30, 2014   Contact information:   [address] [place], [place]   [id number]  [phone]      Follow-up recommendations:  Activity:  As tolerated Diet:  regular  Comments:   Take all medications as prescribed. Keep all follow-up appointments as scheduled.  Do not consume alcohol or use illegal drugs while on prescription medications. Report any adverse effects from your medications to your primary care provider promptly.  In the event of recurrent symptoms or worsening symptoms, call 911, a crisis hotline, or go to the nearest emergency department for evaluation.   Total Discharge Time:  Greater than 30 minutes.  Signed: Beau FannyWithrow, John C, FNP-BC 09/23/2014, 11:56 AM    Patient seen, Suicide Assessment Completed.  Disposition Plan Reviewed

## 2014-09-23 NOTE — Progress Notes (Signed)
Pt reports he is feeling better and has less depression than when he was admitted.  He says the groups have been helpful although he chose not to attend group tonight.  He feels the medication are helping him.  He plans to return home at discharge and become more involved in church.  He denies SI/HI/AV at this time.  Pt makes his needs known to staff.  Support and encouragement offered.  Safety maintained with q15 minute checks.

## 2014-09-23 NOTE — Progress Notes (Signed)
Discharge Note: Discharge instructions/prescriptions/medication samples given to patient. Patient verbalized understanding of discharge instructions and prescriptions. Returned belongings to patient. Denies SI/HI/AVH. Patient d/c without incident to the lobby and transported home by sister.

## 2014-09-23 NOTE — [facility] Group Notes (Signed)
[facility] LCSW Group Therapy  Living A Balanced Life  1:15 - 2: 30          09/23/2014    Type of Therapy:  Group Therapy  Participation Level:  Appropriate  Participation Quality:  Appropriate, Supportive  Affect:  Appropriate  Cognitive:  Attentive Appropriate  Insight: Developing/Improving  Engagement in Therapy:  Developing/Improving  Modes of Intervention:  Discussion Exploration Problem-Solving Supportive   Summary of Progress/Problems: Topic for group was Living a Balanced Life.  Patient was able to show how life has become unbalanced. He was very supportive of peers and often suggestion on living live balance by planning and taking small steps to complete tasks.Wynn Banker 09/23/2014

## 2014-09-28 NOTE — Progress Notes (Signed)
Patient Discharge Instructions:  After Visit Summary (AVS):   Faxed to:  09/28/14 Discharge Summary Note:   Faxed to:  09/28/14 Psychiatric Admission Assessment Note:   Faxed to:  09/28/14 Suicide Risk Assessment - Discharge Assessment:   Faxed to:  09/28/14 Faxed/Sent to the Next Level Care provider:  09/28/14 [facility] @ [phone] [facility] @ [phone]  Jerelene ReddenSheena E [place], 09/28/2014, 3:02 PM

## 2014-09-29 NOTE — ED Provider Notes (Signed)
CSN: [id number]     Arrival date & time 09/19/14  2238 History   First MD Initiated Contact with Patient 09/19/14 2303     Chief Complaint  Patient presents with  . Suicidal     (Consider location/radiation/quality/duration/timing/severity/associated sxs/prior Treatment) HPI   58 year old male brought in after suicide attempt. Patient was sitting in his car and apparently was trying to kill himself via the car exhaust. Patient is not sure how long he was exposed.Denies any headaches. No confusion. No shortness of breath.no nausea or vomiting. Patient has a very flat affect and is tearful. History really encouraged to get what little history is actually giving me.  He states that he does feel very isolated but is reluctant with details. Denies homicidal ideation. Denies any hallucinations.  Past Medical History  Diagnosis Date  . Hypertension   . Bipolar disorder   . Gout   . Allergic rhinitis   . Obesity     ,moderate  . Prostatitis     , Episodic  . Mild renal insufficiency     , with creatinine of 1.3 in 2010, was side effect of med  . Bradycardia 2012     due to lithium   Past Surgical History  Procedure Laterality Date  . Appendectomy    . Tonsillectomy    . Umbilical hernia repair    . Cataract extraction Right   . Colonoscopy with propofol N/A 06/14/2014    Procedure: COLONOSCOPY WITH PROPOFOL;  Surgeon: Charolett BumpersMartin K Johnson, MD;  Location: [facility];  Service: Endoscopy;  Laterality: N/A;   Family History  Problem Relation Age of Onset  . Other Mother     Viral Meningitis  . Mitral valve prolapse Mother   . Arrhythmia Mother   . Hypothyroidism Mother   . Hypertension Father   . Gout Father   . Alzheimer's disease Father    History  Substance Use Topics  . Smoking status: Never Smoker   . Smokeless tobacco: Never Used  . Alcohol Use: No    Review of Systems  All systems reviewed and negative, other than as noted in HPI.   Allergies  Allopurinol;  Aspirin; Penicillins; and Ciprofloxacin  Home Medications   Prior to Admission medications   Medication Sig Start Date End Date Taking? Authorizing Provider  amLODipine (NORVASC) 5 MG tablet Take 1 tablet (5 mg total) by mouth daily. 09/23/14   Velna HatchetSheila May Agustin, NP  cholecalciferol (VITAMIN D) 1000 UNITS tablet Take 2 tablets (2,000 Units total) by mouth daily. 09/23/14   Velna HatchetSheila May Agustin, NP  colchicine (COLCRYS) 0.6 MG tablet Take 1 tablet (0.6 mg total) by mouth daily. 09/23/14   Velna HatchetSheila May Agustin, NP  divalproex (DEPAKOTE ER) 500 MG 24 hr tablet Take 2 tablets (1,000 mg total) by mouth at bedtime. Take total of 1,000 mg total at bedtime. 09/23/14   Velna HatchetSheila May Agustin, NP  DULoxetine (CYMBALTA) 60 MG capsule Take 2 capsules (120 mg total) by mouth at bedtime. 09/23/14   Velna HatchetSheila May Agustin, NP  febuxostat (ULORIC) 40 MG tablet Take 1 tablet (40 mg total) by mouth daily. 09/23/14   Velna HatchetSheila May Agustin, NP  lamoTRIgine (LAMICTAL) 150 MG tablet Take 1 tablet (150 mg total) by mouth daily. 09/23/14   Velna HatchetSheila May Agustin, NP  losartan (COZAAR) 50 MG tablet Take 1 tablet (50 mg total) by mouth daily. 09/23/14   Velna HatchetSheila May Agustin, NP   BP 128/78 mmHg  Pulse 92  Temp(Src) 98.3 F (36.8 C) (Oral)  Resp 20  Ht 5\' 10"  (1.778 m)  Wt 185 lb (83.915 kg)  BMI 26.54 kg/m2  SpO2 100% Physical Exam  Constitutional: He appears well-developed and well-nourished. No distress.  HENT:  Head: Normocephalic and atraumatic.  Eyes: Conjunctivae are normal. Right eye exhibits no discharge. Left eye exhibits no discharge.  Neck: Neck supple.  Cardiovascular: Normal rate, regular rhythm and normal heart sounds.  Exam reveals no gallop and no friction rub.   No murmur heard. Pulmonary/Chest: Effort normal and breath sounds normal. No respiratory distress.  Abdominal: Soft. He exhibits no distension. There is no tenderness.  Musculoskeletal: He exhibits no edema or tenderness.  Neurological: He is alert.  Skin:  Skin is warm and dry.  Psychiatric:  Flat affect. Not making eye contact. Crying at times. Speech is slow, deliberate volume is low.   Nursing note and vitals reviewed.   ED Course  Procedures (including critical care time) Labs Review Labs Reviewed  CARBOXYHEMOGLOBIN - Abnormal; Notable for the following:    Methemoglobin 2.7 (*)    All other components within normal limits  COMPREHENSIVE METABOLIC PANEL - Abnormal; Notable for the following:    Glucose, Bld 109 (*)    GFR calc non Af Amer 64 (*)    GFR calc Af Amer 74 (*)    Anion gap 19 (*)    All other components within normal limits  SALICYLATE LEVEL - Abnormal; Notable for the following:    Salicylate Lvl <2.0 (*)    All other components within normal limits  CBC WITH DIFFERENTIAL  ACETAMINOPHEN LEVEL  URINALYSIS, ROUTINE W REFLEX MICROSCOPIC  URINE RAPID DRUG SCREEN (HOSP PERFORMED)    Imaging Review No results found.   EKG Interpretation None      MDM   Final diagnoses:  Suicide attempt  Bipolar affective disorder, depressed, severe    58 rolled male suicide attempts. Carboxyhemoglobin is normal. He does not appear distressed, but obviously very depressed. Patient is not very forthcoming with further history. We'll medically clear for further psychiatric evaluation.    Raeford Razor, MD 09/29/14 [phone]

## 2014-11-26 ENCOUNTER — Ambulatory Visit (INDEPENDENT_AMBULATORY_CARE_PROVIDER_SITE_OTHER): Payer: BLUE CROSS/BLUE SHIELD | Admitting: Cardiology

## 2014-11-26 ENCOUNTER — Encounter: Payer: Self-pay | Admitting: Cardiology

## 2014-11-26 VITALS — BP 128/90 | HR 58 | Ht 71.0 in | Wt 194.0 lb

## 2014-11-26 DIAGNOSIS — I10 Essential (primary) hypertension: Secondary | ICD-10-CM

## 2014-11-26 DIAGNOSIS — I4589 Other specified conduction disorders: Secondary | ICD-10-CM

## 2014-11-26 DIAGNOSIS — F314 Bipolar disorder, current episode depressed, severe, without psychotic features: Secondary | ICD-10-CM

## 2014-11-26 NOTE — Progress Notes (Signed)
[address] [place], [place]  [address] Phone: [phone] Fax:  [phone]  Date:  11/26/2014   ID:  Edwin [name], DOB 1956-02-21, MRN [id number]  PCP:  [name], MD   History of Present Illness: Edwin [name] is a 59 y.o. 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 Diltiazem, which was used for high blood pressure, was discontinued and he continued to have slow heart rates. A cardiac catheterization was performed in 2012 to exclude ischemic origin for his A-V dissociation. This was reassuring. EF was normal.  Currently, he is feeling very well on Depakote. He feels more spontaneous. He feels better than he did on the lithium. I'm very pleased with this. Dr. Jennelle Human must be also.  He's not having any dizziness, syncope. His heart rate today is in the 50s to 70s.  Denies any syncope, bleeding, chest pain, orthopnea, shortness of breath  Wt Readings from Last 3 Encounters:  11/26/14 194 lb (87.998 kg)  09/20/14 181 lb (82.101 kg)  09/19/14 185 lb (83.915 kg)     Past Medical History  Diagnosis Date  . Hypertension   . Bipolar disorder   . Gout   . Allergic rhinitis   . Obesity     ,moderate  . Prostatitis     , Episodic  . Mild renal insufficiency     , with creatinine of 1.3 in 2010, was side effect of med  . Bradycardia 2012     due to lithium    Past Surgical History  Procedure Laterality Date  . Appendectomy    . Tonsillectomy    . Umbilical hernia repair    . Cataract extraction Right   . Colonoscopy with propofol N/A 06/14/2014    Procedure: COLONOSCOPY WITH PROPOFOL;  Surgeon: Charolett Bumpers, MD;  Location: [facility];  Service: Endoscopy;  Laterality: N/A;    Current Outpatient Prescriptions  Medication Sig Dispense Refill  . amLODipine (NORVASC) 2.5 MG tablet Take 2.5 mg by mouth daily.   10  . cholecalciferol (VITAMIN D) 1000 UNITS tablet  Take 2 tablets (2,000 Units total) by mouth daily. 60 tablet 0  . colchicine (COLCRYS) 0.6 MG tablet Take 1 tablet (0.6 mg total) by mouth daily. 30 tablet 0  . divalproex (DEPAKOTE ER) 500 MG 24 hr tablet Take 2 tablets (1,000 mg total) by mouth at bedtime. Take total of 1,000 mg total at bedtime. 60 tablet 0  . DULoxetine (CYMBALTA) 60 MG capsule Take 2 capsules (120 mg total) by mouth at bedtime. 60 capsule 0  . febuxostat (ULORIC) 40 MG tablet Take 1 tablet (40 mg total) by mouth daily. 30 tablet 0  . lamoTRIgine (LAMICTAL) 200 MG tablet   0  . losartan (COZAAR) 50 MG tablet Take 1 tablet (50 mg total) by mouth daily. 30 tablet 0   No current facility-administered medications for this visit.    Allergies:    Allergies  Allergen Reactions  . Allopurinol Other (See Comments)    emotionally labile  . Aspirin Hives  . Penicillins Hives  . Ciprofloxacin Rash    Social History:  The patient  reports that he has never smoked. He has never used smokeless tobacco. He reports that he does not drink alcohol or use illicit drugs.   ROS:  Please see the history of present illness.   No syncope, no bleeding, no orthopnea, no PND  PHYSICAL EXAM: VS:  BP 128/90 mmHg  Pulse 58  Ht 5\' 11"  (1.803 m)  Wt 194 lb (87.998 kg)  BMI 27.07 kg/m2 Well nourished, well developed, in no acute distress HEENT: normal Neck: no JVD Cardiac:  normal S1, S2; RRR/sinus bradycardia; no murmur Lungs:  clear to auscultation bilaterally, no wheezing, rhonchi or rales Abd: soft, nontender, no hepatomegaly Ext: no edema Skin: warm and dry Neuro: no focal abnormalities noted  EKG:  11/26/14-sinus bradycardia rate 58, subtle nonspecific ST changes, vertical axis-prior Sinus bradycardia with sinus arrhythmia, heart rate 48 beats per minute. Possible old septal infarct pattern.     ASSESSMENT AND PLAN:  1. Previous A-V dissociation. Doing well. Asymptomatic bradycardia. No changes made in medications. He is  feeling much better. Unfortunately his mother in her 12s has had a stroke. Now at [facility]. 2. Hypertension-diastolic is 90 today, previously was 80. Dr. Kevan Ny had changed his amlodipine from 5 down to 2.5. Continue to monitor. May need increased dose. Losartan. 3. One-year follow-up, he would like to continue to maintain visits given his prior electrical abnormality.  Signed, Donato Schultz, MD [facility]  11/26/2014 11:13 AM

## 2014-11-26 NOTE — Patient Instructions (Signed)
The current medical regimen is effective;  continue present plan and medications.  Follow up in 1 year with Dr. Skains.  You will receive a letter in the mail 2 months before you are due.  Please call us when you receive this letter to schedule your follow up appointment.  Thank you for choosing [facility]!!     

## 2015-12-30 ENCOUNTER — Ambulatory Visit: Payer: BLUE CROSS/BLUE SHIELD | Admitting: Cardiology

## 2016-02-01 ENCOUNTER — Encounter: Payer: Self-pay | Admitting: Cardiology

## 2016-02-01 ENCOUNTER — Ambulatory Visit (INDEPENDENT_AMBULATORY_CARE_PROVIDER_SITE_OTHER): Payer: BLUE CROSS/BLUE SHIELD | Admitting: Cardiology

## 2016-02-01 VITALS — BP 118/70 | HR 58 | Ht 71.0 in | Wt 219.8 lb

## 2016-02-01 DIAGNOSIS — I4589 Other specified conduction disorders: Secondary | ICD-10-CM | POA: Diagnosis not present

## 2016-02-01 DIAGNOSIS — I10 Essential (primary) hypertension: Secondary | ICD-10-CM

## 2016-02-01 NOTE — Patient Instructions (Signed)

## 2016-02-01 NOTE — Progress Notes (Signed)
[address] [place], [place]  [address] Phone: [phone] Fax:  [phone]  Date:  02/01/2016   ID:  Edwin [name], DOB [birth year], MRN [id number]  PCP:  [name], MD   History of Present Illness: Edwin [name] is a 60 y.o. 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 Diltiazem, which was used for high blood pressure, was discontinued and he continued to have slow heart rates. A cardiac catheterization was performed in 2012 to exclude ischemic origin for his A-V dissociation. This was reassuring. EF was normal.  Currently, he is feeling very well on Depakote. He feels more spontaneous. He feels better than he did on the lithium. I'm very pleased with this. Dr. Jennelle Human must be also.  He's not having any dizziness, syncope. His heart rate today is in the 50-70's..  Denies any syncope, bleeding, chest pain, orthopnea, shortness of breath  02/01/16 -during piano lessions feels SOB random. ?anxiety. No syncope.   Wt Readings from Last 3 Encounters:  02/01/16 219 lb 12.8 oz (99.701 kg)  11/26/14 194 lb (87.998 kg)  09/20/14 181 lb (82.101 kg)     Past Medical History  Diagnosis Date  . Hypertension   . Bipolar disorder (HCC)   . Gout   . Allergic rhinitis   . Obesity     ,moderate  . Prostatitis     , Episodic  . Mild renal insufficiency     , with creatinine of 1.3 in 2010, was side effect of med  . Bradycardia 2012     due to lithium    Past Surgical History  Procedure Laterality Date  . Appendectomy    . Tonsillectomy    . Umbilical hernia repair    . Cataract extraction Right   . Colonoscopy with propofol N/A 06/14/2014    Procedure: COLONOSCOPY WITH PROPOFOL;  Surgeon: Charolett Bumpers, MD;  Location: [facility];  Service: Endoscopy;  Laterality: N/A;    Current Outpatient Prescriptions  Medication Sig Dispense Refill  . amLODipine (NORVASC) 2.5 MG  tablet Take 2.5 mg by mouth daily.   10  . Brexpiprazole (REXULTI) 0.5 MG TABS Take by mouth. 1 tablet by mouth every other day    . cholecalciferol (VITAMIN D) 1000 UNITS tablet Take 2 tablets (2,000 Units total) by mouth daily. 60 tablet 0  . divalproex (DEPAKOTE ER) 500 MG 24 hr tablet Take 2 tablets (1,000 mg total) by mouth at bedtime. Take total of 1,000 mg total at bedtime. 60 tablet 0  . DULoxetine (CYMBALTA) 60 MG capsule Take 2 capsules (120 mg total) by mouth at bedtime. 60 capsule 0  . febuxostat (ULORIC) 40 MG tablet Take 1 tablet (40 mg total) by mouth daily. 30 tablet 0  . L-Methylfolate-B6-B12 (VITACIRC-B PO) Take 1 tablet by mouth daily.    [name] lamoTRIgine (LAMICTAL) 200 MG tablet Take 200 mg by mouth daily.   0  . LevOCARNitine (CARNITINE PO) Take 500 mg by mouth daily.    [name] losartan (COZAAR) 50 MG tablet Take 1 tablet (50 mg total) by mouth daily. 30 tablet 0  . propranolol (INDERAL) 20 MG tablet Take 20 mg by mouth. 1-2 daily as needed for hand tremors  0   No current facility-administered medications for this visit.    Allergies:    Allergies  Allergen Reactions  . Allopurinol Other (See Comments)    emotionally labile  .  Aspirin Hives  . Penicillins Hives  . Ciprofloxacin Rash    Social History:  The patient  reports that he has never smoked. He has never used smokeless tobacco. He reports that he does not drink alcohol or use illicit drugs.   ROS:  Please see the history of present illness.   No syncope, no bleeding, no orthopnea, no PND    PHYSICAL EXAM: VS:  BP 118/70 mmHg  Pulse 58  Ht  (1.803 m)  Wt 219 lb 12.8 oz (99.701 kg)  BMI 30.67 kg/m2 Well nourished, well developed, in no acute distress HEENT: normal Neck: no JVD Cardiac:  normal S1, S2; RRR/sinus bradycardia; no murmur Lungs:  clear to auscultation bilaterally, no wheezing, rhonchi or rales Abd: soft, nontender, no hepatomegaly Ext: no edema Skin: warm and dry Neuro: no focal  abnormalities noted  EKG:  EKG ordered today. 02/01/16-sinus bradycardia 55, vertical axis, no change from prior. Personally viewed. Heart rate slightly increased. 11/26/14-sinus bradycardia rate 58, subtle nonspecific ST changes, vertical axis-prior Sinus bradycardia with sinus arrhythmia, heart rate 48 beats per minute. Possible old septal infarct pattern.     ASSESSMENT AND PLAN:  1. Previous A-V dissociation. Doing well. Asymptomatic bradycardia. No changes made in medications. He is feeling much better.  2. 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 Just sitting there he may feel some shortness of breath. He wonders if this is anxiety related. Normally he feels quite calm. He is not feeling it with exertion. He does not feel any palpitations with this. Offered further testing but at this point we will continue to monitor clinically. No syncope. 3. Hypertension-doing well on current medications. No change.  4. One-year follow-up, he would like to continue to maintain visits given his prior electrical abnormality.  Signed, Donato Schultz, MD [facility]  02/01/2016 11:09 AM

## 2016-02-22 DIAGNOSIS — F3181 Bipolar II disorder: Secondary | ICD-10-CM | POA: Diagnosis not present

## 2016-03-13 DIAGNOSIS — F3181 Bipolar II disorder: Secondary | ICD-10-CM | POA: Diagnosis not present

## 2016-03-28 DIAGNOSIS — F3181 Bipolar II disorder: Secondary | ICD-10-CM | POA: Diagnosis not present

## 2016-04-06 DIAGNOSIS — H2512 Age-related nuclear cataract, left eye: Secondary | ICD-10-CM | POA: Diagnosis not present

## 2016-04-06 DIAGNOSIS — H5213 Myopia, bilateral: Secondary | ICD-10-CM | POA: Diagnosis not present

## 2016-04-06 DIAGNOSIS — Z961 Presence of intraocular lens: Secondary | ICD-10-CM | POA: Diagnosis not present

## 2016-04-12 DIAGNOSIS — F3181 Bipolar II disorder: Secondary | ICD-10-CM | POA: Diagnosis not present

## 2016-04-24 DIAGNOSIS — F3181 Bipolar II disorder: Secondary | ICD-10-CM | POA: Diagnosis not present

## 2016-05-02 DIAGNOSIS — L720 Epidermal cyst: Secondary | ICD-10-CM | POA: Diagnosis not present

## 2016-05-02 DIAGNOSIS — D225 Melanocytic nevi of trunk: Secondary | ICD-10-CM | POA: Diagnosis not present

## 2016-05-02 DIAGNOSIS — B356 Tinea cruris: Secondary | ICD-10-CM | POA: Diagnosis not present

## 2016-05-15 DIAGNOSIS — G43A1 Cyclical vomiting, intractable: Secondary | ICD-10-CM | POA: Diagnosis not present

## 2016-05-17 ENCOUNTER — Encounter (HOSPITAL_COMMUNITY): Payer: Self-pay | Admitting: Emergency Medicine

## 2016-05-17 ENCOUNTER — Emergency Department (HOSPITAL_COMMUNITY)
Admission: EM | Admit: 2016-05-17 | Discharge: 2016-05-17 | Disposition: A | Discharge status: Home / Self Care | Payer: BLUE CROSS/BLUE SHIELD | Source: Home / Self Care

## 2016-05-17 DIAGNOSIS — I10 Essential (primary) hypertension: Secondary | ICD-10-CM

## 2016-05-17 DIAGNOSIS — F319 Bipolar disorder, unspecified: Secondary | ICD-10-CM | POA: Diagnosis not present

## 2016-05-17 DIAGNOSIS — R112 Nausea with vomiting, unspecified: Secondary | ICD-10-CM | POA: Diagnosis not present

## 2016-05-17 DIAGNOSIS — E876 Hypokalemia: Secondary | ICD-10-CM | POA: Diagnosis not present

## 2016-05-17 DIAGNOSIS — G43A1 Cyclical vomiting, intractable: Secondary | ICD-10-CM | POA: Diagnosis not present

## 2016-05-17 DIAGNOSIS — I129 Hypertensive chronic kidney disease with stage 1 through stage 4 chronic kidney disease, or unspecified chronic kidney disease: Secondary | ICD-10-CM | POA: Diagnosis not present

## 2016-05-17 DIAGNOSIS — N182 Chronic kidney disease, stage 2 (mild): Secondary | ICD-10-CM | POA: Diagnosis not present

## 2016-05-17 DIAGNOSIS — R410 Disorientation, unspecified: Secondary | ICD-10-CM | POA: Diagnosis not present

## 2016-05-17 DIAGNOSIS — Z6830 Body mass index (BMI) 30.0-30.9, adult: Secondary | ICD-10-CM | POA: Diagnosis not present

## 2016-05-17 DIAGNOSIS — R1031 Right lower quadrant pain: Secondary | ICD-10-CM | POA: Diagnosis not present

## 2016-05-17 DIAGNOSIS — Z5321 Procedure and treatment not carried out due to patient leaving prior to being seen by health care provider: Secondary | ICD-10-CM | POA: Insufficient documentation

## 2016-05-17 DIAGNOSIS — E860 Dehydration: Secondary | ICD-10-CM | POA: Diagnosis not present

## 2016-05-17 DIAGNOSIS — K567 Ileus, unspecified: Secondary | ICD-10-CM | POA: Diagnosis not present

## 2016-05-17 DIAGNOSIS — R1084 Generalized abdominal pain: Secondary | ICD-10-CM | POA: Diagnosis not present

## 2016-05-17 DIAGNOSIS — R404 Transient alteration of awareness: Secondary | ICD-10-CM | POA: Diagnosis not present

## 2016-05-17 DIAGNOSIS — M109 Gout, unspecified: Secondary | ICD-10-CM | POA: Diagnosis not present

## 2016-05-17 DIAGNOSIS — R531 Weakness: Secondary | ICD-10-CM | POA: Diagnosis not present

## 2016-05-17 DIAGNOSIS — K5669 Other intestinal obstruction: Secondary | ICD-10-CM | POA: Diagnosis not present

## 2016-05-17 DIAGNOSIS — F314 Bipolar disorder, current episode depressed, severe, without psychotic features: Secondary | ICD-10-CM | POA: Diagnosis not present

## 2016-05-17 DIAGNOSIS — E669 Obesity, unspecified: Secondary | ICD-10-CM | POA: Diagnosis not present

## 2016-05-17 DIAGNOSIS — R110 Nausea: Secondary | ICD-10-CM | POA: Diagnosis not present

## 2016-05-17 DIAGNOSIS — Z8249 Family history of ischemic heart disease and other diseases of the circulatory system: Secondary | ICD-10-CM | POA: Diagnosis not present

## 2016-05-17 DIAGNOSIS — R109 Unspecified abdominal pain: Secondary | ICD-10-CM | POA: Diagnosis not present

## 2016-05-17 LAB — COMPREHENSIVE METABOLIC PANEL
ALK PHOS: 55 U/L (ref 38–126)
ALT: 28 U/L (ref 17–63)
AST: 29 U/L (ref 15–41)
Albumin: 4 g/dL (ref 3.5–5.0)
Anion gap: 12 (ref 5–15)
BILIRUBIN TOTAL: 0.9 mg/dL (ref 0.3–1.2)
BUN: 25 mg/dL — ABNORMAL HIGH (ref 6–20)
CALCIUM: 8.7 mg/dL — AB (ref 8.9–10.3)
CO2: 20 mmol/L — ABNORMAL LOW (ref 22–32)
Chloride: 106 mmol/L (ref 101–111)
Creatinine, Ser: 1.42 mg/dL — ABNORMAL HIGH (ref 0.61–1.24)
GFR calc Af Amer: >60 mL/min (ref >60)
GFR, EST NON AFRICAN AMERICAN: 53 mL/min — AB (ref >60)
GLUCOSE: 149 mg/dL — AB (ref 65–99)
POTASSIUM: 3.9 mmol/L (ref 3.5–5.1)
Sodium: 138 mmol/L (ref 135–145)
TOTAL PROTEIN: 6.9 g/dL (ref 6.5–8.1)

## 2016-05-17 LAB — CBC
HEMATOCRIT: 44.3 % (ref 39.0–52.0)
HEMOGLOBIN: 15.2 g/dL (ref 13.0–17.0)
MCH: 31.2 pg (ref 26.0–34.0)
MCHC: 34.3 g/dL (ref 30.0–36.0)
MCV: 91 fL (ref 78.0–100.0)
Platelets: 314 10*3/uL (ref 150–400)
RBC: 4.87 MIL/uL (ref 4.22–5.81)
RDW: 12.8 % (ref 11.5–15.5)
WBC: 5.2 10*3/uL (ref 4.0–10.5)

## 2016-05-17 LAB — LIPASE, BLOOD: Lipase: 19 U/L (ref 11–51)

## 2016-05-17 MED ORDER — ONDANSETRON 4 MG PO TBDP
ORAL_TABLET | ORAL | Status: AC
  Filled 2016-05-17: qty 1

## 2016-05-17 MED ORDER — ONDANSETRON 4 MG PO TBDP
4.0000 mg | ORAL_TABLET | Freq: Once | ORAL | Status: AC | PRN
  Administered 2016-05-17: 4 mg via ORAL

## 2016-05-17 NOTE — ED Notes (Signed)
Patient stated that he could no longer wait for a room in the back so he left without seeing a doctor

## 2016-05-17 NOTE — ED Notes (Signed)
Per EMS: pt sts N/V x 6 days; pt seen at PCP and given meds not helping

## 2016-05-18 ENCOUNTER — Inpatient Hospital Stay (HOSPITAL_COMMUNITY)
Admission: EM | Admit: 2016-05-18 | Discharge: 2016-05-20 | DRG: 390 | Disposition: A | Discharge status: Home / Self Care | Payer: BLUE CROSS/BLUE SHIELD | Attending: Internal Medicine | Admitting: Internal Medicine

## 2016-05-18 ENCOUNTER — Encounter (HOSPITAL_COMMUNITY): Payer: Self-pay | Admitting: Emergency Medicine

## 2016-05-18 ENCOUNTER — Emergency Department (HOSPITAL_COMMUNITY): Payer: BLUE CROSS/BLUE SHIELD

## 2016-05-18 DIAGNOSIS — K567 Ileus, unspecified: Secondary | ICD-10-CM | POA: Diagnosis present

## 2016-05-18 DIAGNOSIS — N183 Chronic kidney disease, stage 3 unspecified: Secondary | ICD-10-CM | POA: Diagnosis present

## 2016-05-18 DIAGNOSIS — I10 Essential (primary) hypertension: Secondary | ICD-10-CM | POA: Diagnosis present

## 2016-05-18 DIAGNOSIS — I129 Hypertensive chronic kidney disease with stage 1 through stage 4 chronic kidney disease, or unspecified chronic kidney disease: Secondary | ICD-10-CM | POA: Diagnosis present

## 2016-05-18 DIAGNOSIS — N182 Chronic kidney disease, stage 2 (mild): Secondary | ICD-10-CM | POA: Diagnosis present

## 2016-05-18 DIAGNOSIS — K5669 Other intestinal obstruction: Secondary | ICD-10-CM | POA: Diagnosis not present

## 2016-05-18 DIAGNOSIS — E669 Obesity, unspecified: Secondary | ICD-10-CM | POA: Diagnosis present

## 2016-05-18 DIAGNOSIS — F314 Bipolar disorder, current episode depressed, severe, without psychotic features: Secondary | ICD-10-CM | POA: Diagnosis present

## 2016-05-18 DIAGNOSIS — E876 Hypokalemia: Secondary | ICD-10-CM | POA: Diagnosis present

## 2016-05-18 DIAGNOSIS — M109 Gout, unspecified: Secondary | ICD-10-CM | POA: Diagnosis present

## 2016-05-18 DIAGNOSIS — E860 Dehydration: Secondary | ICD-10-CM | POA: Diagnosis present

## 2016-05-18 DIAGNOSIS — R109 Unspecified abdominal pain: Secondary | ICD-10-CM | POA: Diagnosis present

## 2016-05-18 DIAGNOSIS — Z6830 Body mass index (BMI) 30.0-30.9, adult: Secondary | ICD-10-CM

## 2016-05-18 DIAGNOSIS — Z8249 Family history of ischemic heart disease and other diseases of the circulatory system: Secondary | ICD-10-CM

## 2016-05-18 DIAGNOSIS — F319 Bipolar disorder, unspecified: Secondary | ICD-10-CM | POA: Diagnosis present

## 2016-05-18 DIAGNOSIS — M1000 Idiopathic gout, unspecified site: Secondary | ICD-10-CM | POA: Diagnosis present

## 2016-05-18 DIAGNOSIS — F3132 Bipolar disorder, current episode depressed, moderate: Secondary | ICD-10-CM | POA: Diagnosis present

## 2016-05-18 DIAGNOSIS — K56609 Unspecified intestinal obstruction, unspecified as to partial versus complete obstruction: Secondary | ICD-10-CM | POA: Diagnosis present

## 2016-05-18 LAB — I-STAT TROPONIN, ED: Troponin i, poc: 0 ng/mL (ref 0.00–0.08)

## 2016-05-18 LAB — CBC
HCT: 39.1 % (ref 39.0–52.0)
HEMOGLOBIN: 13.7 g/dL (ref 13.0–17.0)
MCH: 31 pg (ref 26.0–34.0)
MCHC: 35 g/dL (ref 30.0–36.0)
MCV: 88.5 fL (ref 78.0–100.0)
PLATELETS: 278 10*3/uL (ref 150–400)
RBC: 4.42 MIL/uL (ref 4.22–5.81)
RDW: 12.8 % (ref 11.5–15.5)
WBC: 8.4 10*3/uL (ref 4.0–10.5)

## 2016-05-18 LAB — URINALYSIS, ROUTINE W REFLEX MICROSCOPIC
Bilirubin Urine: NEGATIVE
Glucose, UA: NEGATIVE mg/dL
Ketones, ur: NEGATIVE mg/dL
LEUKOCYTES UA: NEGATIVE
Nitrite: NEGATIVE
PROTEIN: NEGATIVE mg/dL
SPECIFIC GRAVITY, URINE: 1.009 (ref 1.005–1.030)
pH: 6.5 (ref 5.0–8.0)

## 2016-05-18 LAB — COMPREHENSIVE METABOLIC PANEL
ALT: 25 U/L (ref 17–63)
ANION GAP: 12 (ref 5–15)
AST: 20 U/L (ref 15–41)
Albumin: 4.2 g/dL (ref 3.5–5.0)
Alkaline Phosphatase: 54 U/L (ref 38–126)
BUN: 18 mg/dL (ref 6–20)
CALCIUM: 8.4 mg/dL — AB (ref 8.9–10.3)
CHLORIDE: 107 mmol/L (ref 101–111)
CO2: 20 mmol/L — AB (ref 22–32)
Creatinine, Ser: 1.27 mg/dL — ABNORMAL HIGH (ref 0.61–1.24)
Glucose, Bld: 135 mg/dL — ABNORMAL HIGH (ref 65–99)
Potassium: 3 mmol/L — ABNORMAL LOW (ref 3.5–5.1)
SODIUM: 139 mmol/L (ref 135–145)
Total Bilirubin: 0.4 mg/dL (ref 0.3–1.2)
Total Protein: 7.3 g/dL (ref 6.5–8.1)

## 2016-05-18 LAB — URINE MICROSCOPIC-ADD ON

## 2016-05-18 LAB — LIPASE, BLOOD: LIPASE: 26 U/L (ref 11–51)

## 2016-05-18 IMAGING — CT CT ABD-PELV W/O CM
2 of 4 series · 16 of 46 positions shown, 18 images · non-contrast
Comparison: Abdominal CT dated [DATE]

CLINICAL DATA: 52-year-old male with abdominal pain and vomiting

EXAM:
CT ABDOMEN AND PELVIS WITHOUT CONTRAST
TECHNIQUE: Multidetector CT imaging of the abdomen and pelvis was performed
following the standard protocol without IV contrast.

[Series 2: abd/pel w/o · axial · non-contrast · 0.84mm/px · z∈[-498,-48]mm · 13 of 104 slices shown, 15 images]
[im 7/104  soft-tissue]
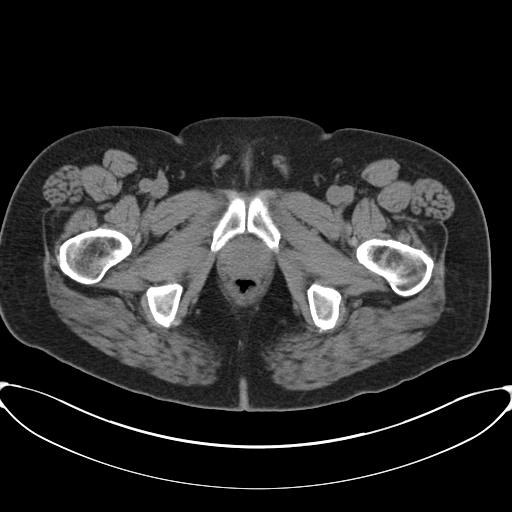
[im 7/104  bone]
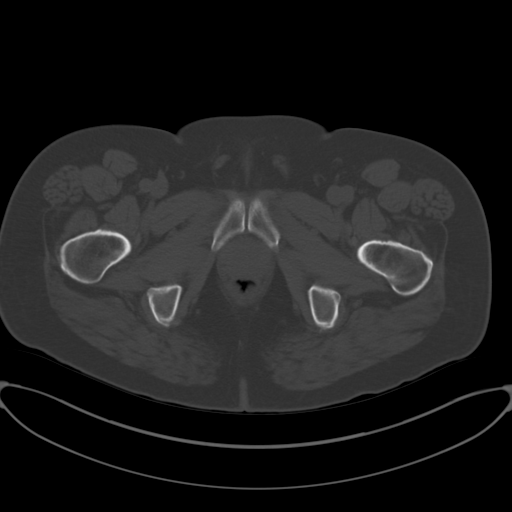
[im 13/104  soft-tissue]
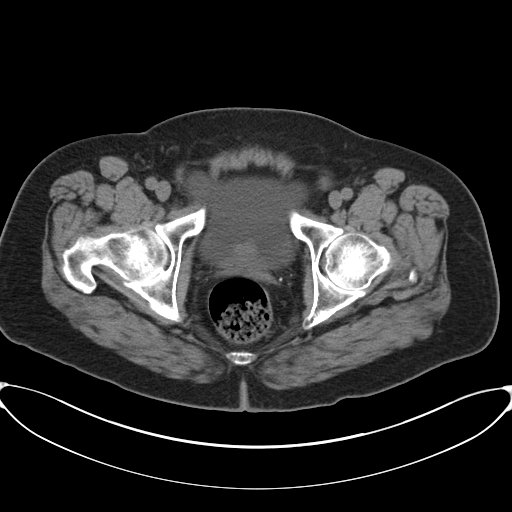
[im 25/104  soft-tissue]
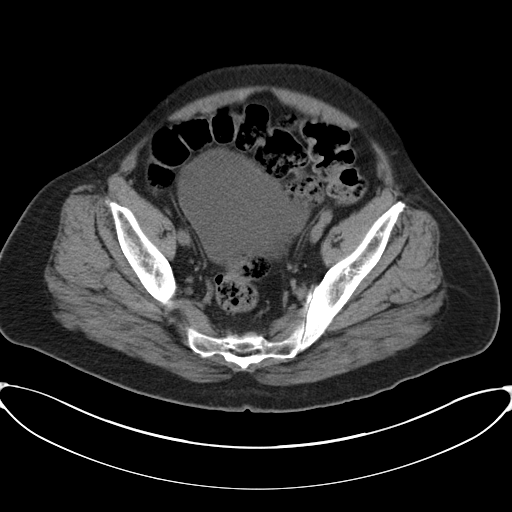
[im 31/104  soft-tissue]
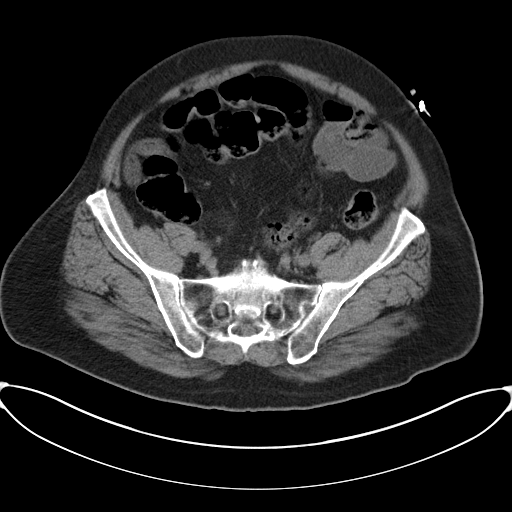
[im 37/104  soft-tissue]
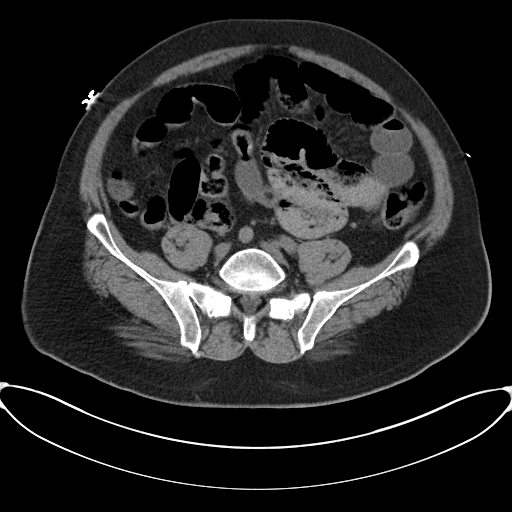
[im 43/104  soft-tissue]
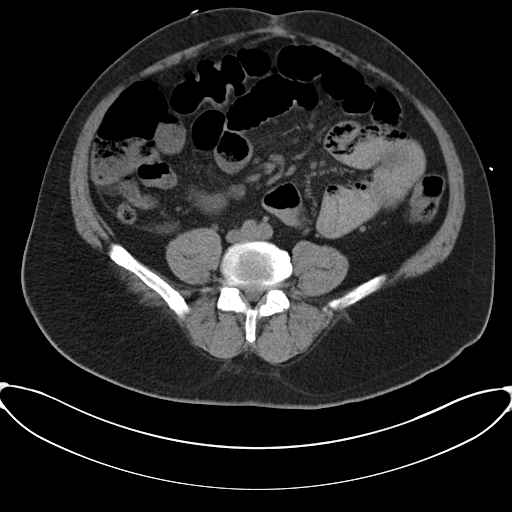
[im 55/104  soft-tissue]
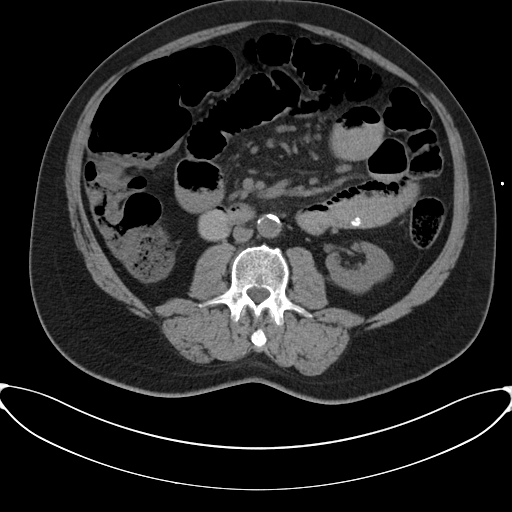
[im 61/104  soft-tissue]
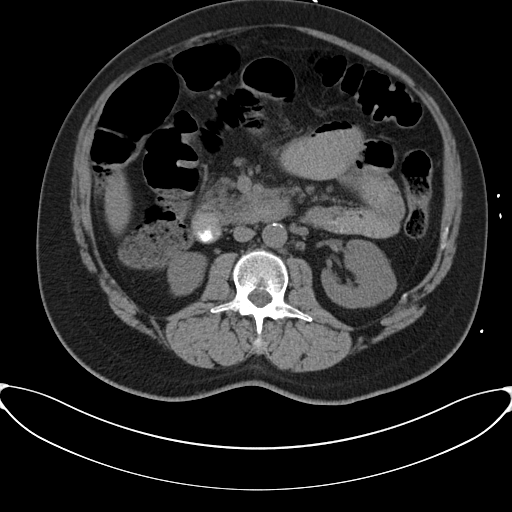
[im 67/104  soft-tissue]
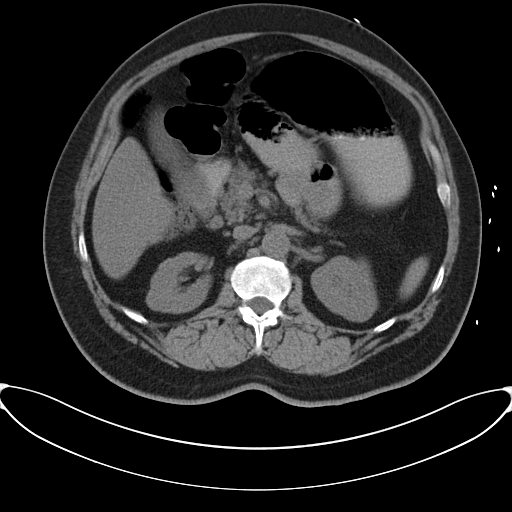
[im 67/104  bone]
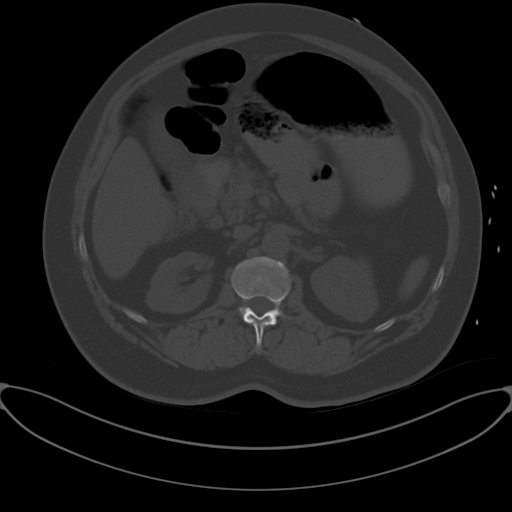
[im 73/104  soft-tissue]
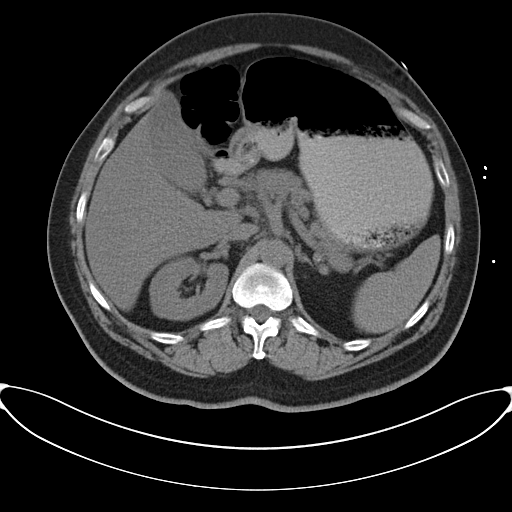
[im 79/104  soft-tissue]
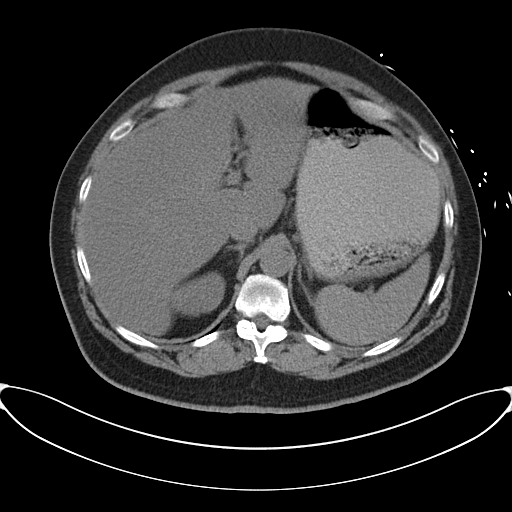
[im 91/104  soft-tissue]
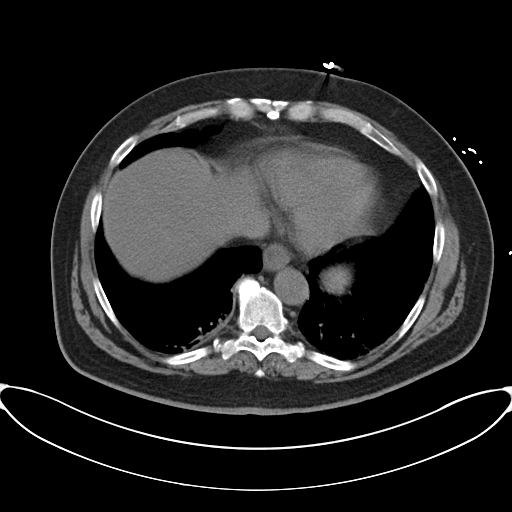
[im 97/104  soft-tissue]
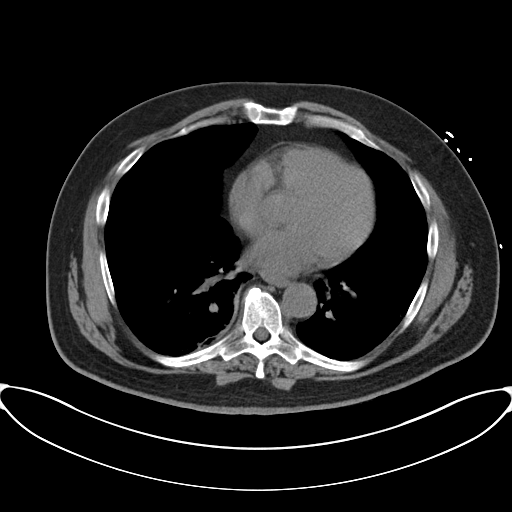

[Series 5: coronal · coronal · 0.78mm/px · 3 of 171 slices shown]
[im 57/171  soft-tissue]
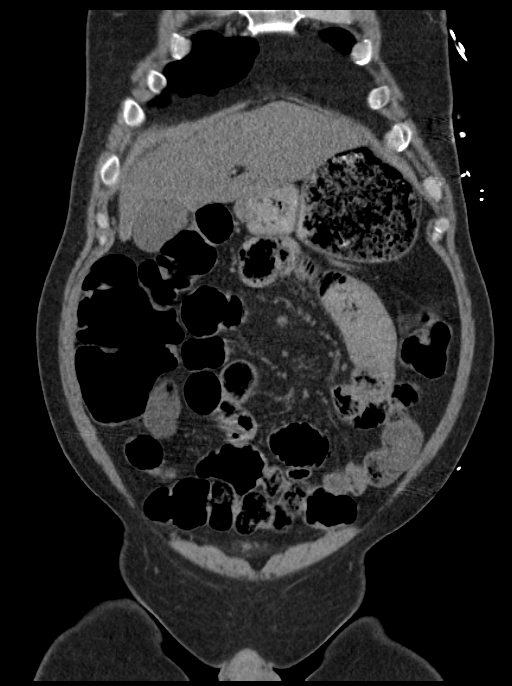
[im 76/171  soft-tissue]
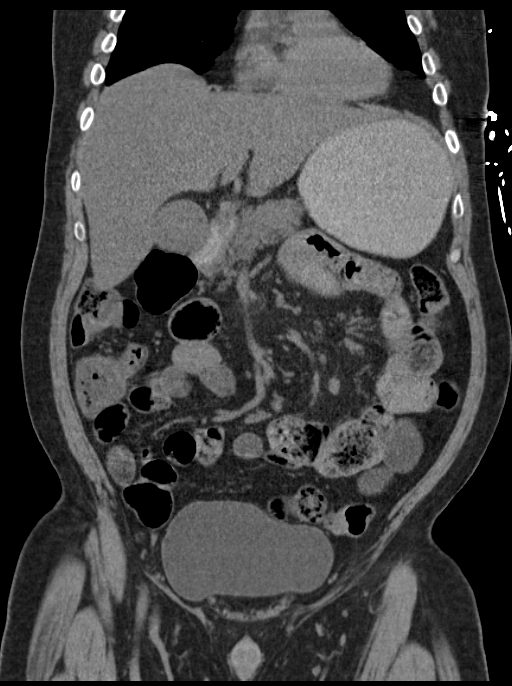
[im 95/171  soft-tissue]
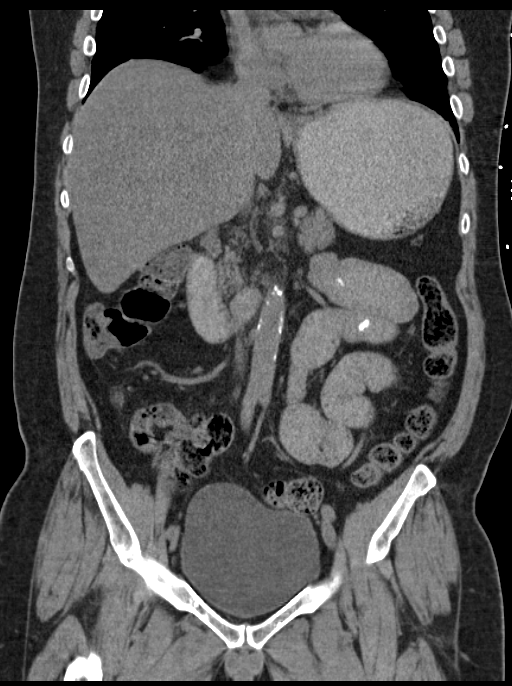

[16 of 46 positions shown; findings below may reference images not displayed]

FINDINGS: Evaluation of this exam is limited in the absence of intravenous
contrast.

Bibasilar linear atelectasis/scarring. The visualized lung bases are
otherwise clear.

No intra-abdominal free air or free fluid.

Diffuse fatty infiltration of the liver. Small stones noted within
the gallbladder. No pericholecystic fluid. Ultrasound may provide
better evaluation of the gallbladder if clinically indicated. The
pancreas, spleen, adrenal glands, appear unremarkable. There is no
hydronephrosis or nephrolithiasis on either side. A subcentimeter
high attenuating left renal lesion is not well characterized but may
represent a complex/hemorrhagic cyst. Ultrasound is recommended for
further evaluation. The visualized ureters and urinary bladder
appear unremarkable. The prostate and seminal vesicles are grossly
unremarkable.

There multiple mildly dilated loops of small bowel in the abdomen
measuring up to 4 cm in the left upper abdomen. Loops of nondilated
small bowel noted in the lower abdomen. No transition zone
identified. Findings may represent an ileus versus a partial/low
grade small bowel obstruction secondary to underlying adhesions.
Moderate stool throughout the colon. Appendectomy.

There is mild aortoiliac atherosclerotic disease. No portal venous
gas identified. There is no adenopathy. There is diastases of
anterior abdominal wall musculature and level of the umbilicus. An
umbilical hernia repair mesh is noted. The abdominal wall soft
tissues are otherwise unremarkable. The osseous structures are
intact.
IMPRESSION: Ileus versus low-grade/partial small-bowel obstruction secondary to
adhesions. Clinical correlation is recommended.

## 2016-05-18 MED ORDER — ONDANSETRON HCL 4 MG/2ML IJ SOLN
4.0000 mg | Freq: Once | INTRAMUSCULAR | Status: AC
  Administered 2016-05-18: 4 mg via INTRAVENOUS
  Filled 2016-05-18: qty 2

## 2016-05-18 MED ORDER — SODIUM CHLORIDE 0.9 % IV BOLUS (SEPSIS)
1000.0000 mL | Freq: Once | INTRAVENOUS | Status: AC
  Administered 2016-05-18: 1000 mL via INTRAVENOUS

## 2016-05-18 MED ORDER — POTASSIUM CHLORIDE CRYS ER 20 MEQ PO TBCR
40.0000 meq | EXTENDED_RELEASE_TABLET | Freq: Once | ORAL | Status: AC
  Administered 2016-05-18: 40 meq via ORAL
  Filled 2016-05-18: qty 2

## 2016-05-18 NOTE — ED Notes (Signed)
EKG given to EDP,Rees,MD., for review. 

## 2016-05-18 NOTE — ED Provider Notes (Signed)
CSN: [id number]     Arrival date & time 05/18/16  2102 History   First MD Initiated Contact with Patient 05/18/16 2133     Chief Complaint  Patient presents with  . Abdominal Pain    RLQ     Patient is a 60 y.o. male presenting with abdominal pain. The history is provided by the patient. No language interpreter was used.  Abdominal Pain  Edwin [name] is a 60 y.o. male who presents to the Emergency Department complaining of vomiting, abdominal pain.  He reports 6 days of nausea and vomiting. He has decreased oral intake and decreased appetite. He has missed his medications for most of the week due to vomiting. Today he had a small amount of diarrhea. He also endorses one day of lower abdominal cramping. He denies any fevers. He went to the emergency department last night but left without being seen after 4 hour wait. He has a history of umbilical hernia surgery, bipolar disorder, hypertension. Symptoms are moderate, constant, worsening.  Past Medical History  Diagnosis Date  . Hypertension   . Bipolar disorder (HCC)   . Gout   . Allergic rhinitis   . Obesity     ,moderate  . Prostatitis     , Episodic  . Mild renal insufficiency     , with creatinine of 1.3 in 2010, was side effect of med  . Bradycardia 2012     due to lithium   Past Surgical History  Procedure Laterality Date  . Appendectomy    . Tonsillectomy    . Umbilical hernia repair    . Cataract extraction Right   . Colonoscopy with propofol N/A 06/14/2014    Procedure: COLONOSCOPY WITH PROPOFOL;  Surgeon: Charolett Bumpers, MD;  Location: [facility];  Service: Endoscopy;  Laterality: N/A;   Family History  Problem Relation Age of Onset  . Other Mother     Viral Meningitis  . Mitral valve prolapse Mother   . Arrhythmia Mother   . Hypothyroidism Mother   . Hypertension Father   . Gout Father   . Alzheimer's disease Father    Social History  Substance Use Topics  . Smoking status: Never Smoker   . Smokeless  tobacco: Never Used  . Alcohol Use: No    Review of Systems  Gastrointestinal: Positive for abdominal pain.  All other systems reviewed and are negative.     Allergies  Allopurinol; Aspirin; Penicillins; and Ciprofloxacin  Home Medications   Prior to Admission medications   Medication Sig Start Date End Date Taking? Authorizing Provider  amLODipine (NORVASC) 2.5 MG tablet Take 2.5 mg by mouth daily.  11/17/14  Yes Historical Provider, MD  ARIPiprazole (ABILIFY) 5 MG tablet Take 5 mg by mouth daily. 04/25/16  Yes Historical Provider, MD  cholecalciferol (VITAMIN D) 1000 UNITS tablet Take 2 tablets (2,000 Units total) by mouth daily. 09/23/14  Yes Adonis Brook, NP  divalproex (DEPAKOTE ER) 500 MG 24 hr tablet Take 2 tablets (1,000 mg total) by mouth at bedtime. Take total of 1,000 mg total at bedtime. 09/23/14  Yes Adonis Brook, NP  DULoxetine (CYMBALTA) 60 MG capsule Take 2 capsules (120 mg total) by mouth at bedtime. 09/23/14  Yes Adonis Brook, NP  lamoTRIgine (LAMICTAL) 200 MG tablet Take 200 mg by mouth daily.  11/17/14  Yes Historical Provider, MD  LevOCARNitine (CARNITINE PO) Take 500 mg by mouth daily.   Yes Historical Provider, MD  losartan (COZAAR) 50 MG tablet Take 1  tablet (50 mg total) by mouth daily. 09/23/14  Yes Adonis Brook, NP  promethazine (PHENERGAN) 25 MG tablet Take 25 mg by mouth every 6 (six) hours as needed. For nausea 05/15/16  Yes Historical Provider, MD  propranolol (INDERAL) 20 MG tablet Take 20-40 mg by mouth daily as needed (for hand tremors.).  12/13/15  Yes Historical Provider, MD  ULORIC 80 MG TABS Take 40 mg by mouth daily. 02/15/16  Yes Historical Provider, MD   BP 157/99 mmHg  Pulse 101  Temp(Src) 98.2 F (36.8 C) (Oral)  Resp 24  Ht 5\' 11"  (1.803 m)  Wt 215 lb (97.523 kg)  BMI 30.00 kg/m2  SpO2 99% Physical Exam  Constitutional: He is oriented to person, place, and time. He appears well-developed and well-nourished.  HENT:  Head:  Normocephalic and atraumatic.  Cardiovascular: Normal rate and regular rhythm.   No murmur heard. Pulmonary/Chest: Effort normal and breath sounds normal. No respiratory distress.  Abdominal: Soft. He exhibits distension.  Decreased bowel sounds, mild lower abdominal tenderness without guarding or rebound  Musculoskeletal: He exhibits no edema or tenderness.  Neurological: He is alert and oriented to person, place, and time.  Skin: Skin is warm and dry.  Psychiatric: He has a normal mood and affect. His behavior is normal.  Nursing note and vitals reviewed.   ED Course  Procedures (including critical care time) Labs Review Labs Reviewed  COMPREHENSIVE METABOLIC PANEL - Abnormal; Notable for the following:    Potassium 3.0 (*)    CO2 20 (*)    Glucose, Bld 135 (*)    Creatinine, Ser 1.27 (*)    Calcium 8.4 (*)    All other components within normal limits  URINALYSIS, ROUTINE W REFLEX MICROSCOPIC (NOT AT [facility]) - Abnormal; Notable for the following:    Hgb urine dipstick TRACE (*)    All other components within normal limits  URINE MICROSCOPIC-ADD ON - Abnormal; Notable for the following:    Squamous Epithelial / LPF 0-5 (*)    Bacteria, UA RARE (*)    All other components within normal limits  LIPASE, BLOOD  CBC  I-STAT TROPOININ, ED    Imaging Review No results found. I have personally reviewed and evaluated these images and lab results as part of my medical decision-making.   EKG Interpretation None      MDM   Final diagnoses:  None    Patient here for evaluation of nausea and vomiting for the last week, mild suprapubic tenderness. BMP with mild dehydration and hypokalemia. Providing IV fluids. CT abdomen pending.  Patient care transferred pending CT abdomen.    Tilden Fossa, MD 05/19/16 1124

## 2016-05-18 NOTE — ED Notes (Addendum)
Patient c/o abd pain since Monday, states he has been very nauseated without vomiting. Patient reports poor PO intake. Patient went to [facility], [facility], last night. Patient was sent by PCP for evaluation tonight. Last BM, this am. Patient is accompanied by his sister today.

## 2016-05-18 NOTE — ED Notes (Signed)
Rn to draw blood with start of IV

## 2016-05-19 DIAGNOSIS — F319 Bipolar disorder, unspecified: Secondary | ICD-10-CM | POA: Diagnosis present

## 2016-05-19 DIAGNOSIS — E876 Hypokalemia: Secondary | ICD-10-CM | POA: Diagnosis present

## 2016-05-19 DIAGNOSIS — N182 Chronic kidney disease, stage 2 (mild): Secondary | ICD-10-CM | POA: Diagnosis not present

## 2016-05-19 DIAGNOSIS — I129 Hypertensive chronic kidney disease with stage 1 through stage 4 chronic kidney disease, or unspecified chronic kidney disease: Secondary | ICD-10-CM | POA: Diagnosis present

## 2016-05-19 DIAGNOSIS — R109 Unspecified abdominal pain: Secondary | ICD-10-CM | POA: Diagnosis present

## 2016-05-19 DIAGNOSIS — N183 Chronic kidney disease, stage 3 unspecified: Secondary | ICD-10-CM | POA: Diagnosis present

## 2016-05-19 DIAGNOSIS — K567 Ileus, unspecified: Principal | ICD-10-CM

## 2016-05-19 DIAGNOSIS — R1084 Generalized abdominal pain: Secondary | ICD-10-CM | POA: Diagnosis not present

## 2016-05-19 DIAGNOSIS — M1000 Idiopathic gout, unspecified site: Secondary | ICD-10-CM | POA: Diagnosis present

## 2016-05-19 DIAGNOSIS — K56609 Unspecified intestinal obstruction, unspecified as to partial versus complete obstruction: Secondary | ICD-10-CM | POA: Diagnosis present

## 2016-05-19 DIAGNOSIS — K5669 Other intestinal obstruction: Secondary | ICD-10-CM

## 2016-05-19 DIAGNOSIS — F314 Bipolar disorder, current episode depressed, severe, without psychotic features: Secondary | ICD-10-CM | POA: Diagnosis not present

## 2016-05-19 DIAGNOSIS — E860 Dehydration: Secondary | ICD-10-CM | POA: Diagnosis present

## 2016-05-19 DIAGNOSIS — I10 Essential (primary) hypertension: Secondary | ICD-10-CM | POA: Diagnosis not present

## 2016-05-19 DIAGNOSIS — Z6830 Body mass index (BMI) 30.0-30.9, adult: Secondary | ICD-10-CM | POA: Diagnosis not present

## 2016-05-19 DIAGNOSIS — R112 Nausea with vomiting, unspecified: Secondary | ICD-10-CM | POA: Diagnosis present

## 2016-05-19 DIAGNOSIS — E669 Obesity, unspecified: Secondary | ICD-10-CM | POA: Diagnosis present

## 2016-05-19 DIAGNOSIS — R1031 Right lower quadrant pain: Secondary | ICD-10-CM | POA: Diagnosis not present

## 2016-05-19 DIAGNOSIS — M109 Gout, unspecified: Secondary | ICD-10-CM | POA: Diagnosis present

## 2016-05-19 DIAGNOSIS — Z8249 Family history of ischemic heart disease and other diseases of the circulatory system: Secondary | ICD-10-CM | POA: Diagnosis not present

## 2016-05-19 LAB — ABO/RH: ABO/RH(D): A POS

## 2016-05-19 LAB — CBC
HCT: 36.4 % — ABNORMAL LOW (ref 39.0–52.0)
HEMOGLOBIN: 12.6 g/dL — AB (ref 13.0–17.0)
MCH: 31 pg (ref 26.0–34.0)
MCHC: 34.6 g/dL (ref 30.0–36.0)
MCV: 89.7 fL (ref 78.0–100.0)
Platelets: 262 10*3/uL (ref 150–400)
RBC: 4.06 MIL/uL — ABNORMAL LOW (ref 4.22–5.81)
RDW: 12.9 % (ref 11.5–15.5)
WBC: 9 10*3/uL (ref 4.0–10.5)

## 2016-05-19 LAB — GLUCOSE, CAPILLARY: GLUCOSE-CAPILLARY: 110 mg/dL — AB (ref 65–99)

## 2016-05-19 LAB — MAGNESIUM: Magnesium: 1.8 mg/dL (ref 1.7–2.4)

## 2016-05-19 LAB — BASIC METABOLIC PANEL
ANION GAP: 9 (ref 5–15)
BUN: 14 mg/dL (ref 6–20)
CALCIUM: 8.2 mg/dL — AB (ref 8.9–10.3)
CO2: 22 mmol/L (ref 22–32)
Chloride: 108 mmol/L (ref 101–111)
Creatinine, Ser: 1.18 mg/dL (ref 0.61–1.24)
GFR calc Af Amer: >60 mL/min (ref >60)
GLUCOSE: 110 mg/dL — AB (ref 65–99)
Potassium: 3.8 mmol/L (ref 3.5–5.1)
SODIUM: 139 mmol/L (ref 135–145)

## 2016-05-19 LAB — TYPE AND SCREEN
ABO/RH(D): A POS
Antibody Screen: NEGATIVE

## 2016-05-19 LAB — PROTIME-INR
INR: 1.23 (ref 0.00–1.49)
Prothrombin Time: 15.2 seconds (ref 11.6–15.2)

## 2016-05-19 LAB — LACTIC ACID, PLASMA: LACTIC ACID, VENOUS: 2.1 mmol/L — AB (ref 0.5–1.9)

## 2016-05-19 LAB — APTT: aPTT: 31 seconds (ref 24–37)

## 2016-05-19 MED ORDER — LAMOTRIGINE 200 MG PO TABS
200.0000 mg | ORAL_TABLET | Freq: Every day | ORAL | Status: DC
  Administered 2016-05-19 – 2016-05-20 (×2): 200 mg via ORAL
  Filled 2016-05-19 (×2): qty 1

## 2016-05-19 MED ORDER — LOSARTAN POTASSIUM 50 MG PO TABS
50.0000 mg | ORAL_TABLET | Freq: Every day | ORAL | Status: DC
  Administered 2016-05-19 – 2016-05-20 (×2): 50 mg via ORAL
  Filled 2016-05-19 (×2): qty 1

## 2016-05-19 MED ORDER — DULOXETINE HCL 30 MG PO CPEP
120.0000 mg | ORAL_CAPSULE | Freq: Every day | ORAL | Status: DC
  Administered 2016-05-19: 120 mg via ORAL
  Filled 2016-05-19: qty 4

## 2016-05-19 MED ORDER — ARIPIPRAZOLE 5 MG PO TABS
5.0000 mg | ORAL_TABLET | Freq: Every day | ORAL | Status: DC
  Administered 2016-05-19 – 2016-05-20 (×2): 5 mg via ORAL
  Filled 2016-05-19 (×2): qty 1

## 2016-05-19 MED ORDER — VITAMIN D3 25 MCG (1000 UNIT) PO TABS
2000.0000 [IU] | ORAL_TABLET | Freq: Every day | ORAL | Status: DC
  Administered 2016-05-19 – 2016-05-20 (×2): 2000 [IU] via ORAL
  Filled 2016-05-19 (×2): qty 2

## 2016-05-19 MED ORDER — SODIUM CHLORIDE 0.9 % IV BOLUS (SEPSIS)
1500.0000 mL | Freq: Once | INTRAVENOUS | Status: AC
  Administered 2016-05-19: 1500 mL via INTRAVENOUS

## 2016-05-19 MED ORDER — LEVOCARNITINE 1 GM/10ML PO SOLN
500.0000 mg | Freq: Every day | ORAL | Status: DC
  Administered 2016-05-19 – 2016-05-20 (×2): 500 mg via ORAL
  Filled 2016-05-19 (×3): qty 5

## 2016-05-19 MED ORDER — HYDRALAZINE HCL 20 MG/ML IJ SOLN: 5.0000 mg | INTRAMUSCULAR | Status: DC | PRN

## 2016-05-19 MED ORDER — ONDANSETRON HCL 4 MG/2ML IJ SOLN
4.0000 mg | Freq: Three times a day (TID) | INTRAMUSCULAR | Status: DC | PRN
Start: 2016-05-19 — End: 2016-05-20

## 2016-05-19 MED ORDER — DIVALPROEX SODIUM ER 500 MG PO TB24
1000.0000 mg | ORAL_TABLET | Freq: Every day | ORAL | Status: DC
  Administered 2016-05-19: 1000 mg via ORAL
  Filled 2016-05-19: qty 2

## 2016-05-19 MED ORDER — SODIUM CHLORIDE 0.9 % IV SOLN
INTRAVENOUS | Status: DC
  Administered 2016-05-19 – 2016-05-20 (×2): via INTRAVENOUS

## 2016-05-19 MED ORDER — CARNITINE 250 MG PO CAPS: 500.0000 mg | ORAL_CAPSULE | Freq: Every day | ORAL | Status: DC

## 2016-05-19 MED ORDER — MORPHINE SULFATE (PF) 2 MG/ML IV SOLN
2.0000 mg | INTRAVENOUS | Status: DC | PRN
Start: 2016-05-19 — End: 2016-05-20

## 2016-05-19 MED ORDER — PROPRANOLOL HCL 20 MG PO TABS
20.0000 mg | ORAL_TABLET | Freq: Every day | ORAL | Status: DC | PRN
  Filled 2016-05-19: qty 2

## 2016-05-19 MED ORDER — AMLODIPINE BESYLATE 5 MG PO TABS
2.5000 mg | ORAL_TABLET | Freq: Every day | ORAL | Status: DC
  Administered 2016-05-19 – 2016-05-20 (×2): 2.5 mg via ORAL
  Filled 2016-05-19 (×2): qty 1

## 2016-05-19 MED ORDER — LORAZEPAM 2 MG/ML IJ SOLN
0.5000 mg | INTRAMUSCULAR | Status: DC | PRN
Start: 2016-05-19 — End: 2016-05-20

## 2016-05-19 MED ORDER — FEBUXOSTAT 40 MG PO TABS
40.0000 mg | ORAL_TABLET | Freq: Every day | ORAL | Status: DC
  Administered 2016-05-19 – 2016-05-20 (×2): 40 mg via ORAL
  Filled 2016-05-19 (×2): qty 1

## 2016-05-19 NOTE — Progress Notes (Deleted)
PHARMACIST - PHYSICIAN ORDER COMMUNICATION  CONCERNING: P&T Medication Policy on Herbal Medications  DESCRIPTION:  This patient's order for:  Carnitine  has been noted.  This product(s) is classified as an "herbal" or natural product. Due to a lack of definitive safety studies or FDA approval, nonstandard manufacturing practices, plus the potential risk of unknown drug-drug interactions while on inpatient medications, the Pharmacy and Therapeutics Committee does not permit the use of "herbal" or natural products of this type within [facility].   ACTION TAKEN: The pharmacy department is unable to verify this order at this time and your patient has been informed of this safety policy. Please reevaluate patient's clinical condition at discharge and address if the herbal or natural product(s) should be resumed at that time.  Terrilee Files, PharmD

## 2016-05-19 NOTE — ED Notes (Signed)
Hospitalist at bedside 

## 2016-05-19 NOTE — ED Notes (Signed)
Dr. James at bedside  

## 2016-05-19 NOTE — ED Notes (Signed)
Pt returned from CT °

## 2016-05-19 NOTE — ED Notes (Signed)
dermabond to bedside

## 2016-05-19 NOTE — ED Notes (Signed)
Dermabond at bedside.  

## 2016-05-19 NOTE — Progress Notes (Signed)
Patient seen and examined. Admitted after midnight secondary to abd pain, nausea and vomiting. Images studies suggesting ileus vs partial SBO. Currently in no acute distress and feeling better overall. No fever and hemodynamically stable. Please refer to H&P written by Dr. Clyde Lundborg for further info/details on admission.  Plan: -will continue bowel rest -continue IVF and supportive care -PRN analgesics and antiemetics -will follow electrolytes and repeat abd x-ray in am -if patient pain worsen or start having vomiting will place NGT (currently not needed). -encourage to keep himself active.   Vassie Loll [phone]

## 2016-05-19 NOTE — ED Notes (Signed)
Blood being drawn at this time.  

## 2016-05-19 NOTE — H&P (Signed)
History and Physical    Edwin [name]:[id number] DOB: 1956-03-13 DOA: 05/18/2016  Referring MD/NP/PA:   PCP: [name], MD   Patient coming from:  The patient is coming from home.  At baseline, pt is independent for most of ADL.    Chief Complaint: Nausea, vomiting, abdominal pain  HPI: Edwin [name] is a 60 y.o. male with medical history significant of hypertension, gout, depression, bipolar disorder, obesity, prostatitis, CKD-II, abdominal surgery including umbilical hernia repair and appendectomy, who presents with nausea, vomiting, abdominal pain.  Patient reports that he has been having nausea, vomiting, abdominal pain in the past 5 days. He had severe vomiting on Monday, Tuesday and Wednesday when he vomited nearly 10 times each today. He did not have vomiting today, but still has abdominal pain. His abdominal pain is diffuse, most severe in right lower quadrant. It is constant, 5 out of 10 in safety, nonradiating. It is not aggravated or alleviated by any known factors. His last bowel movement is early today. Patient does not have cough, chest pain, short of breath, fever, chills, symptoms of UTI or unilateral weakness.   ED Course: pt was found to have  WC 8.4, temperature normal, resident tachycardia, potassium 3.0, renal function close to baseline,. CT abdomen/pelvis that showed possible ileus versus low-grade small bowel obstruction without transition zone, also has small gallstone without pericholecystic fluid. Pt is pleased a MedSurg bed for observation.   Review of Systems:   General: no fevers, chills, no changes in body weight, has poor appetite, has fatigue HEENT: no blurry vision, hearing changes or sore throat Pulm: no dyspnea, coughing, wheezing CV: no chest pain, no palpitations Abd: has nausea, vomiting, abdominal pain, diarrhea, no constipation GU: no dysuria, burning on urination, increased urinary frequency, hematuria  Ext: no leg edema Neuro: no  unilateral weakness, numbness, or tingling, no vision change or hearing loss Skin: no rash MSK: No muscle spasm, no deformity, no limitation of range of movement in spin Heme: No easy bruising.  Travel history: No recent long distant travel.  Allergy:  Allergies  Allergen Reactions  . Allopurinol Other (See Comments)    emotionally labile  . Aspirin Hives  . Penicillins Hives    Has patient had a PCN reaction causing immediate rash, facial/tongue/throat swelling, SOB or lightheadedness with hypotension:YES Has patient had a PCN reaction causing severe rash involving mucus membranes or skin necrosis:Yes Has patient had a PCN reaction that required hospitalization:NO Has patient had a PCN reaction occurring within the last 10 years:No If all of the above answers are "NO", then may proceed with Cephalosporin use.   . Ciprofloxacin Rash    Past Medical History  Diagnosis Date  . Hypertension   . Bipolar disorder (HCC)   . Gout   . Allergic rhinitis   . Obesity     ,moderate  . Prostatitis     , Episodic  . Mild renal insufficiency     , with creatinine of 1.3 in 2010, was side effect of med  . Bradycardia 2012     due to lithium    Past Surgical History  Procedure Laterality Date  . Appendectomy    . Tonsillectomy    . Umbilical hernia repair    . Cataract extraction Right   . Colonoscopy with propofol N/A 06/14/2014    Procedure: COLONOSCOPY WITH PROPOFOL;  Surgeon: Charolett Bumpers, MD;  Location: [facility];  Service: Endoscopy;  Laterality: N/A;    Social History:  reports  that he has never smoked. He has never used smokeless tobacco. He reports that he does not drink alcohol or use illicit drugs.  Family History:  Family History  Problem Relation Age of Onset  . Other Mother     Viral Meningitis  . Mitral valve prolapse Mother   . Arrhythmia Mother   . Hypothyroidism Mother   . Hypertension Father   . Gout Father   . Alzheimer's disease Father       Prior to Admission medications   Medication Sig Start Date End Date Taking? Authorizing Provider  amLODipine (NORVASC) 2.5 MG tablet Take 2.5 mg by mouth daily.  11/17/14  Yes Historical Provider, MD  ARIPiprazole (ABILIFY) 5 MG tablet Take 5 mg by mouth daily. 04/25/16  Yes Historical Provider, MD  cholecalciferol (VITAMIN D) 1000 UNITS tablet Take 2 tablets (2,000 Units total) by mouth daily. 09/23/14  Yes Adonis Brook, NP  divalproex (DEPAKOTE ER) 500 MG 24 hr tablet Take 2 tablets (1,000 mg total) by mouth at bedtime. Take total of 1,000 mg total at bedtime. 09/23/14  Yes Adonis Brook, NP  DULoxetine (CYMBALTA) 60 MG capsule Take 2 capsules (120 mg total) by mouth at bedtime. 09/23/14  Yes Adonis Brook, NP  lamoTRIgine (LAMICTAL) 200 MG tablet Take 200 mg by mouth daily.  11/17/14  Yes Historical Provider, MD  LevOCARNitine (CARNITINE PO) Take 500 mg by mouth daily.   Yes Historical Provider, MD  losartan (COZAAR) 50 MG tablet Take 1 tablet (50 mg total) by mouth daily. 09/23/14  Yes Adonis Brook, NP  promethazine (PHENERGAN) 25 MG tablet Take 25 mg by mouth every 6 (six) hours as needed. For nausea 05/15/16  Yes Historical Provider, MD  propranolol (INDERAL) 20 MG tablet Take 20-40 mg by mouth daily as needed (for hand tremors.).  12/13/15  Yes Historical Provider, MD  ULORIC 80 MG TABS Take 40 mg by mouth daily. 02/15/16  Yes Historical Provider, MD    Physical Exam: Filed Vitals:   05/18/16 2322 05/19/16 0026 05/19/16 0027 05/19/16 0239  BP: 157/99  144/93 150/92  Pulse: 94 91  90  Temp:      TempSrc:      Resp:  24  19  Height:      Weight:      SpO2: 99% 99%  100%   General: Not in acute distress HEENT:       Eyes: PERRL, EOMI, no scleral icterus.       ENT: No discharge from the ears and nose, no pharynx injection, no tonsillar enlargement.        Neck: No JVD, no bruit, no mass felt. Heme: No neck lymph node enlargement. Cardiac: S1/S2, RRR, No murmurs, No gallops or  rubs. Pulm:  No rales, wheezing, rhonchi or rubs. Abd: Distended, has tenderness in RLQ, no rebound pain, no organomegaly, BS present. GU: No hematuria Ext: No pitting leg edema bilaterally. 2+DP/PT pulse bilaterally. Musculoskeletal: No joint deformities, No joint redness or warmth, no limitation of ROM in spin. Skin: No rashes.  Neuro: Alert, oriented X3, cranial nerves II-XII grossly intact, moves all extremities normally. Psych: Patient is not psychotic, no suicidal or hemocidal ideation.  Labs on Admission: I have personally reviewed following labs and imaging studies  CBC:  Recent Labs Lab 05/17/16 1220 05/18/16 2140  WBC 5.2 8.4  HGB 15.2 13.7  HCT 44.3 39.1  MCV 91.0 88.5  PLT 314 278   Basic Metabolic Panel:  Recent Labs Lab 05/17/16 1220 05/18/16  2140  NA 138 139  K 3.9 3.0*  CL 106 107  CO2 20* 20*  GLUCOSE 149* 135*  BUN 25* 18  CREATININE 1.42* 1.27*  CALCIUM 8.7* 8.4*   GFR: Estimated Creatinine Clearance: 75.5 mL/min (by C-G formula based on Cr of 1.27). Liver Function Tests:  Recent Labs Lab 05/17/16 1220 05/18/16 2140  AST 29 20  ALT 28 25  ALKPHOS 55 54  BILITOT 0.9 0.4  PROT 6.9 7.3  ALBUMIN 4.0 4.2    Recent Labs Lab 05/17/16 1220 05/18/16 2140  LIPASE 19 26   No results for input(s): AMMONIA in the last 168 hours. Coagulation Profile: No results for input(s): INR, PROTIME in the last 168 hours. Cardiac Enzymes: No results for input(s): CKTOTAL, CKMB, CKMBINDEX, TROPONINI in the last 168 hours. BNP (last 3 results) No results for input(s): PROBNP in the last 8760 hours. HbA1C: No results for input(s): HGBA1C in the last 72 hours. CBG: No results for input(s): GLUCAP in the last 168 hours. Lipid Profile: No results for input(s): CHOL, HDL, LDLCALC, TRIG, CHOLHDL, LDLDIRECT in the last 72 hours. Thyroid Function Tests: No results for input(s): TSH, T4TOTAL, FREET4, T3FREE, THYROIDAB in the last 72 hours. Anemia Panel: No  results for input(s): VITAMINB12, FOLATE, FERRITIN, TIBC, IRON, RETICCTPCT in the last 72 hours. Urine analysis:    Component Value Date/Time   COLORURINE YELLOW 05/18/2016 2251   APPEARANCEUR CLEAR 05/18/2016 2251   LABSPEC 1.009 05/18/2016 2251   PHURINE 6.5 05/18/2016 2251   GLUCOSEU NEGATIVE 05/18/2016 2251   HGBUR TRACE* 05/18/2016 2251   BILIRUBINUR NEGATIVE 05/18/2016 2251   KETONESUR NEGATIVE 05/18/2016 2251   PROTEINUR NEGATIVE 05/18/2016 2251   UROBILINOGEN 0.2 09/20/2014 0058   NITRITE NEGATIVE 05/18/2016 2251   LEUKOCYTESUR NEGATIVE 05/18/2016 2251   Sepsis Labs: (procalcitonin:4,lacticidven:4) )No results found for this or any previous visit (from the past 240 hour(s)).   Radiological Exams on Admission: Ct Abdomen Pelvis Wo Contrast  05/19/2016  CLINICAL DATA:  60 year old male with abdominal pain and vomiting EXAM: CT ABDOMEN AND PELVIS WITHOUT CONTRAST TECHNIQUE: Multidetector CT imaging of the abdomen and pelvis was performed following the standard protocol without IV contrast. COMPARISON:  Abdominal CT dated 03/30/2014 FINDINGS: Evaluation of this exam is limited in the absence of intravenous contrast. Bibasilar linear atelectasis/scarring. The visualized lung bases are otherwise clear. No intra-abdominal free air or free fluid. Diffuse fatty infiltration of the liver. Small stones noted within the gallbladder. No pericholecystic fluid. Ultrasound may provide better evaluation of the gallbladder if clinically indicated. The pancreas, spleen, adrenal glands, appear unremarkable. There is no hydronephrosis or nephrolithiasis on either side. A subcentimeter high attenuating left renal lesion is not well characterized but may represent a complex/hemorrhagic cyst. Ultrasound is recommended for further evaluation. The visualized ureters and urinary bladder appear unremarkable. The prostate and seminal vesicles are grossly unremarkable. There multiple mildly dilated loops  of small bowel in the abdomen measuring up to 4 cm in the left upper abdomen. Loops of nondilated small bowel noted in the lower abdomen. No transition zone identified. Findings may represent an ileus versus a partial/low grade small bowel obstruction secondary to underlying adhesions. Moderate stool throughout the colon. Appendectomy. There is mild aortoiliac atherosclerotic disease. No portal venous gas identified. There is no adenopathy. There is diastases of anterior abdominal wall musculature and level of the umbilicus. An umbilical hernia repair mesh is noted. The abdominal wall soft tissues are otherwise unremarkable. The osseous structures are intact. IMPRESSION: Ileus versus  low-grade/partial small-bowel obstruction secondary to adhesions. Clinical correlation is recommended. Electronically Signed   By: Elgie Collard M.D.   On: 05/19/2016 00:50     EKG: Independently reviewed.  Sinus rhythm, QTC 455, low voltage.  Assessment/Plan Principal Problem:   Abdominal pain Active Problems:   Essential hypertension   Bipolar affective disorder, depressed, severe (HCC)   Gout   CKD (chronic kidney disease), stage II   SBO (small bowel obstruction) (HCC)   Ileus (HCC)   Hypokalemia   Abdominal pain, nausea and vomiting: This is likely due to ileus versus low-grade small bowel obstruction as evidenced by the CT scan. There is no transition zone. Currently patient does not have vomiting. His abdominal pain is moderate. Hemodynamically stable.  -place on med surg bed for obs -NPO  -prn NG tube (not started yet) -morphine prn pain -Prn Zofran prn nausea  -IVF: 1L NS and then 175 cc/h -INR/PTT/type & screen -if get worse, please call general surgeon in AM.  Essential hypertension: -IV hydralazine when necessary -Continue home amlodipine, losartan and  propranolol  Bipolar affective disorder and depression: stable -continue home Abilify, Depakote, Cymbalta, Lamictal and when necessary  Ativan  Gout: stable -continue Uloric  CKD-II: Baseline creatinine 1.2. His creatinine is 1.27, BUN 18, which is close to baseline. -Follow-up renal function by BMP  Hypokalemia: K= 3.0 on admission. - Repleted in ED - Check Mg level  DVT ppx: SCD Code Status: Full code Family Communication: None at bed side.   Disposition Plan:  Anticipate discharge back to previous home environment Consults called:  none Admission status:  medical floor/obs   Date of Service 05/19/2016    Lorretta Harp [facility] Pager [phone]  If 7PM-7AM, please contact night-coverage [email or web address] Password [email or web address] 05/19/2016, 3:37 AM

## 2016-05-19 NOTE — ED Notes (Signed)
Pt able to ambulate to BR and back to bed with stand by assist only. Steady gait noted.

## 2016-05-19 NOTE — ED Notes (Signed)
MD at bedside. 

## 2016-05-20 ENCOUNTER — Inpatient Hospital Stay (HOSPITAL_COMMUNITY): Payer: BLUE CROSS/BLUE SHIELD

## 2016-05-20 DIAGNOSIS — I10 Essential (primary) hypertension: Secondary | ICD-10-CM

## 2016-05-20 DIAGNOSIS — F314 Bipolar disorder, current episode depressed, severe, without psychotic features: Secondary | ICD-10-CM

## 2016-05-20 DIAGNOSIS — N182 Chronic kidney disease, stage 2 (mild): Secondary | ICD-10-CM

## 2016-05-20 DIAGNOSIS — M109 Gout, unspecified: Secondary | ICD-10-CM

## 2016-05-20 LAB — CBC
HCT: 33 % — ABNORMAL LOW (ref 39.0–52.0)
HEMOGLOBIN: 11.6 g/dL — AB (ref 13.0–17.0)
MCH: 31.5 pg (ref 26.0–34.0)
MCHC: 35.2 g/dL (ref 30.0–36.0)
MCV: 89.7 fL (ref 78.0–100.0)
PLATELETS: 235 10*3/uL (ref 150–400)
RBC: 3.68 MIL/uL — ABNORMAL LOW (ref 4.22–5.81)
RDW: 13.3 % (ref 11.5–15.5)
WBC: 10.9 10*3/uL — ABNORMAL HIGH (ref 4.0–10.5)

## 2016-05-20 LAB — GLUCOSE, CAPILLARY: GLUCOSE-CAPILLARY: 83 mg/dL (ref 65–99)

## 2016-05-20 LAB — BASIC METABOLIC PANEL
Anion gap: 6 (ref 5–15)
BUN: 14 mg/dL (ref 6–20)
CALCIUM: 8.6 mg/dL — AB (ref 8.9–10.3)
CHLORIDE: 112 mmol/L — AB (ref 101–111)
CO2: 26 mmol/L (ref 22–32)
CREATININE: 1.08 mg/dL (ref 0.61–1.24)
Glucose, Bld: 107 mg/dL — ABNORMAL HIGH (ref 65–99)
Potassium: 3.7 mmol/L (ref 3.5–5.1)
SODIUM: 144 mmol/L (ref 135–145)

## 2016-05-20 LAB — MAGNESIUM: MAGNESIUM: 2 mg/dL (ref 1.7–2.4)

## 2016-05-20 IMAGING — DX DG ABDOMEN 2V
3 series · 3 of 3 positions shown · non-contrast
Comparison: CT [DATE]

CLINICAL DATA: Pt c/o RLQ pain, admitted x 3 days ago for sbo.

EXAM:
ABDOMEN - 2 VIEW

[abdomen erect]
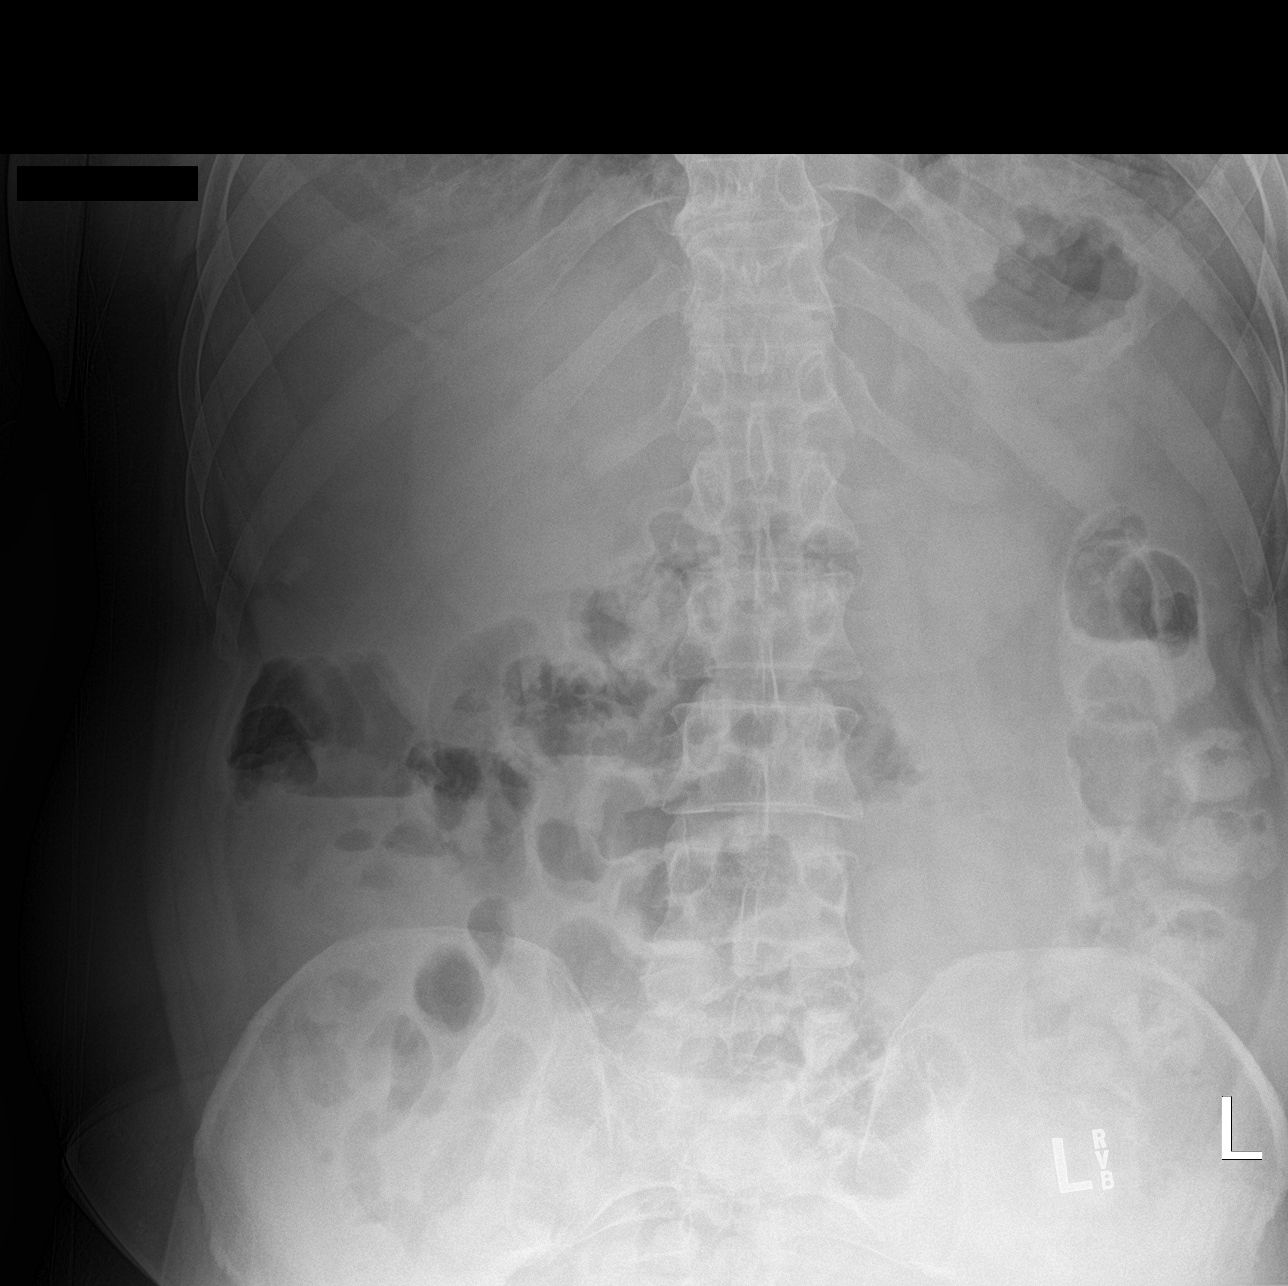

[abdomen supine (1 of 2)]
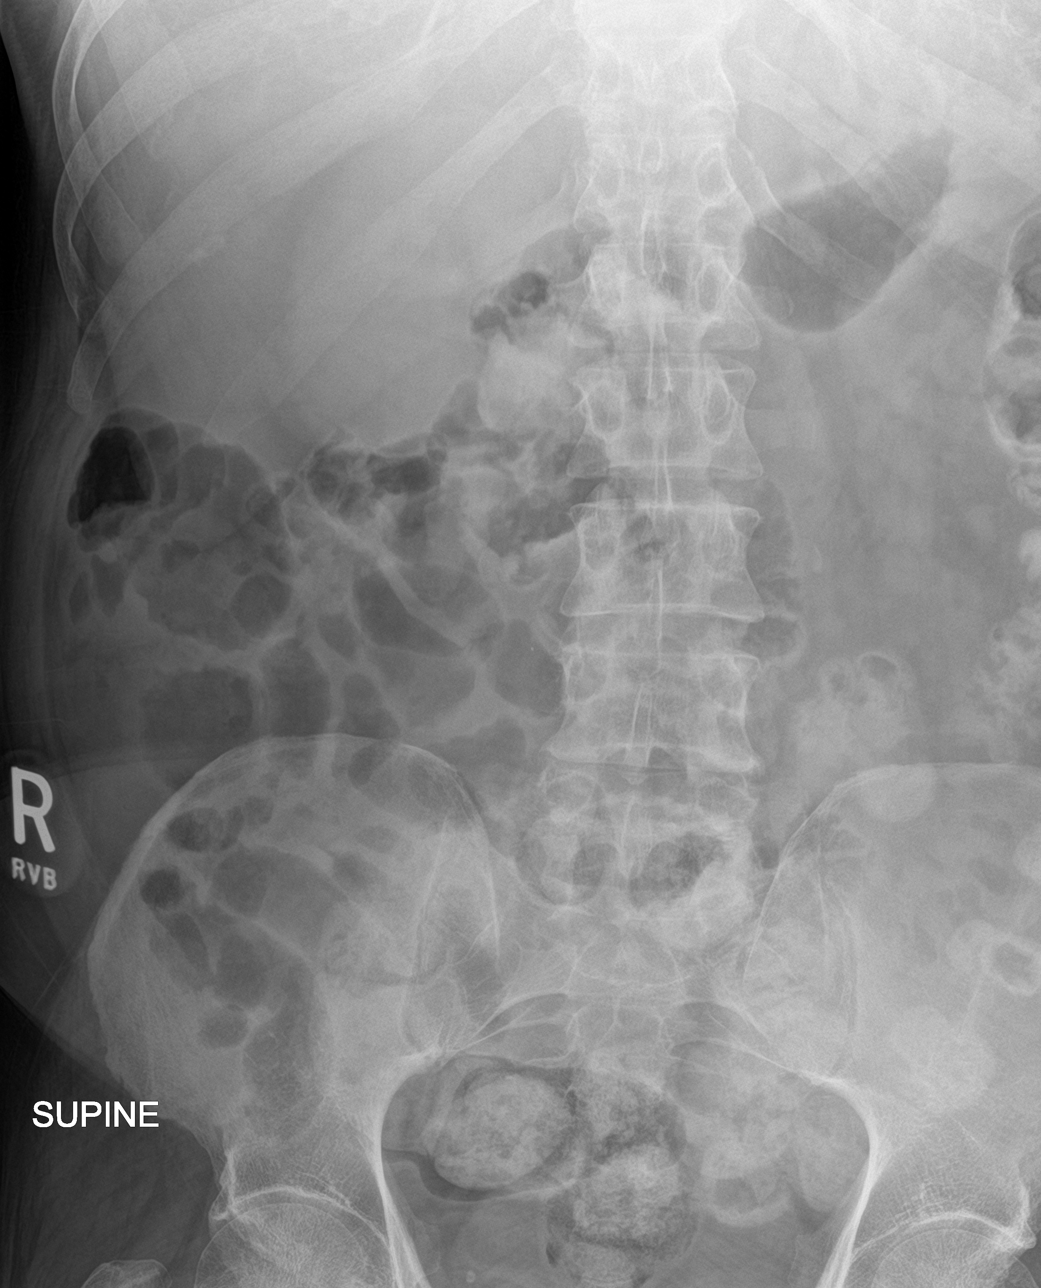

[abdomen supine (2 of 2)]
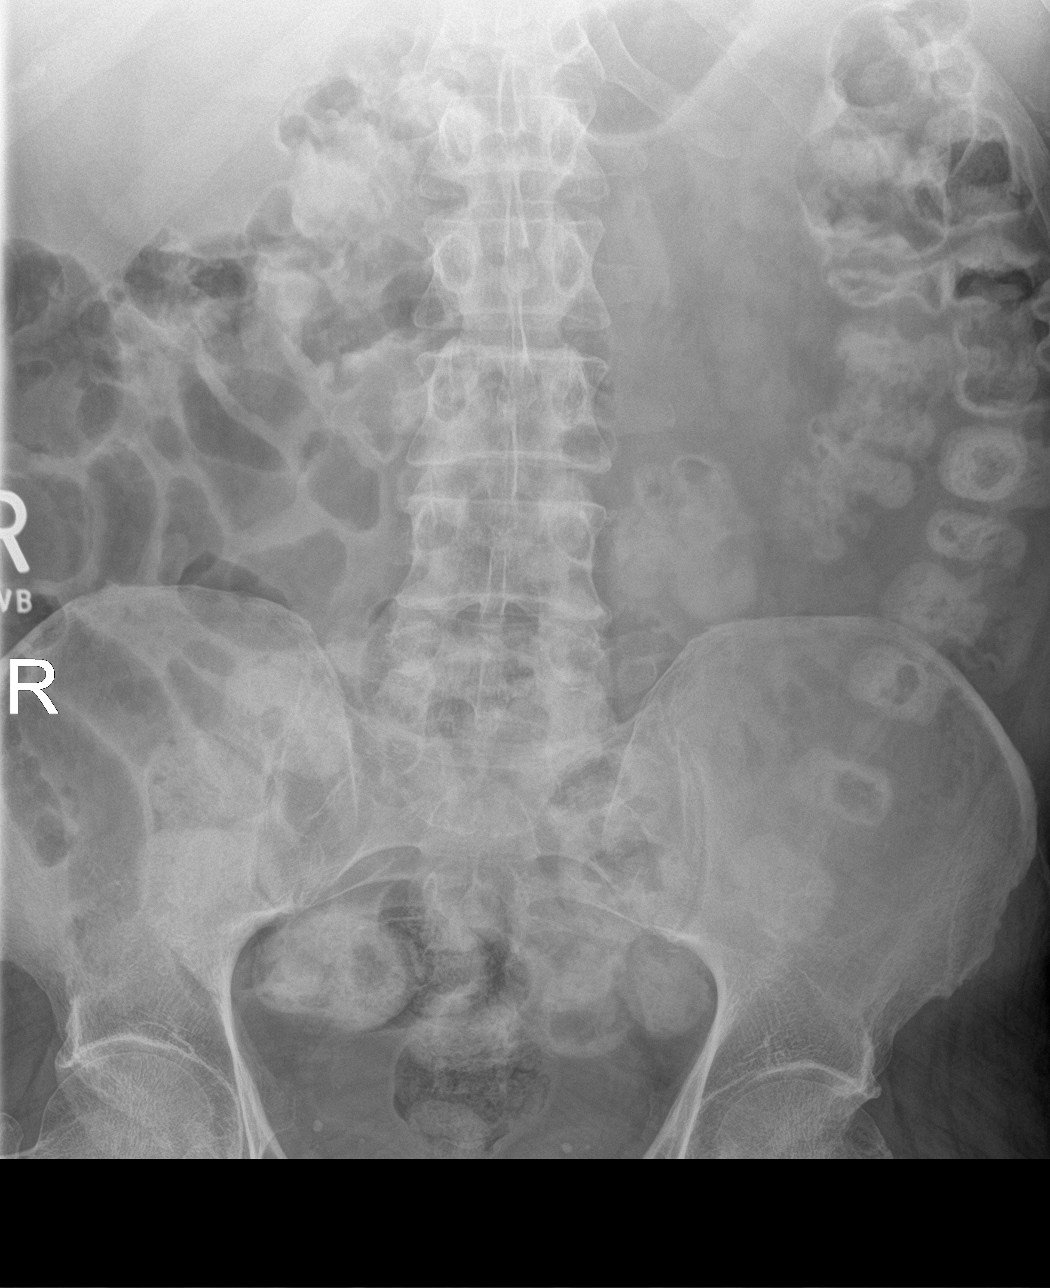

[3 of 3 positions shown; findings below may reference images not displayed]

FINDINGS: No free air. Small bowel and colon are nondilated. Residual oral
contrast in the colon.

Bilateral pelvic phleboliths.

Mild degenerative changes in bilateral hips.
IMPRESSION: 1. Nonobstructive bowel gas pattern.

## 2016-05-20 MED ORDER — POLYETHYLENE GLYCOL 3350 17 G PO PACK: 17.0000 g | PACK | Freq: Every day | ORAL | Status: DC

## 2016-05-20 MED ORDER — BISACODYL 10 MG RE SUPP
10.0000 mg | Freq: Once | RECTAL | Status: AC
  Administered 2016-05-20: 10 mg via RECTAL
  Filled 2016-05-20: qty 1

## 2016-05-20 NOTE — Discharge Summary (Signed)
Physician Discharge Summary  HERNDON GRILL AVW:[id number] DOB: 1956-10-04 DOA: 05/18/2016  PCP: [name], MD  Admit date: 05/18/2016 Discharge date: 05/20/2016  Time spent: 35 minutes  Recommendations for Outpatient Follow-up:  Repeat BMET to follow electrolytes and renal function   Discharge Diagnoses:  Abdominal pain Nausea/vomiting Ileus/partial SBO Essential hypertension Bipolar affective disorder, depressed, severe (HCC) Gout CKD (chronic kidney disease), stage II Hypokalemia   Discharge Condition: stable and improved. Discharge home with instructions to follow up with PCP in 10 days.  Diet recommendation: heart healthy diet (soft consistency)  Filed Weights   05/18/16 2110 05/19/16 0407  Weight: 97.523 kg (215 lb) 99.565 kg (219 lb 8 oz)    History of present illness:  As per H&P written by Dr. Clyde Lundborg on 05/19/16 60 y.o. male with medical history significant of hypertension, gout, depression, bipolar disorder, obesity, prostatitis, CKD-II, abdominal surgery including umbilical hernia repair and appendectomy, who presents with nausea, vomiting, abdominal pain. Patient reports that he has been having nausea, vomiting, abdominal pain in the past 5 days. He had severe vomiting on Monday, Tuesday and Wednesday when he vomited nearly 10 times each today. He did not have vomiting today, but still has abdominal pain. His abdominal pain is diffuse, most severe in right lower quadrant. It is constant, 5 out of 10 in safety, nonradiating. It is not aggravated or alleviated by any known factors. His last bowel movement is early today. Patient does not have cough, chest pain, short of breath, fever, chills, symptoms of UTI or unilateral weakness.   ED Course: pt was found to have WC 8.4, temperature normal, resident tachycardia, potassium 3.0, renal function close to baseline,. CT abdomen/pelvis that showed possible ileus versus partial SBO without transition zone, also has small  gallstone without pericholecystic fluid.   Hospital Course:  Abdominal pain, nausea and vomiting: secondary to Ileus -resolved with conservative management  -patient advise to follow soft diet and was discharge on miralax as part of better bowel regimen strategy  -advise to keep himself well hydrated and will follow up with PCP in 10 days -no abd pain, no nausea, no vomiting and tolerating diet at discharge.  Essential hypertension: -Continue home antihypertensive regimen.  -advise to follow heart healthy diet   Bipolar affective disorder and depression:  -stable overall -no SI or hallucinations -continue home medication regimen: Abilify, Depakote, Cymbalta, Lamictal and when necessary Ativan  Gout:  -stable and w/o flare -will continue Uloric  Hypokalemia: K= 3.0 on admission. - Repleted and WNL at discharge -Mg WNL as well  CKD stage 2: -stable and at baseline -advise to keep himself well hydrated  -BMET at follow to reassess renal function   Procedures:  See below for x-ray reports   Consultations:  None   Discharge Exam: Filed Vitals:   05/20/16 0549 05/20/16 1325  BP: 137/79 148/91  Pulse: 69 61  Temp: 97.9 F (36.6 C) 97.7 F (36.5 C)  Resp: 16 16    General: afebrile, no nausea, no vomiting and no abd pain. Also denies CP and SOB. Had a BM and tolerated soft diet  Cardiovascular: S1 and S2, no rubs, no gallops, no JVD Respiratory: CTA bilaterally  Abd: soft, NT, ND, positive BS Extremities: no edema or cyanosis    Discharge Instructions   Discharge Instructions    Diet - low sodium heart healthy    Complete by:  As directed      Discharge instructions    Complete by:  As directed  Take medications as prescribed Keep yourself well hydrated (at least half your weight in ounces of water) Control portion/amount of food as discussed Slowly advance diet consistency  Arrange follow up with PCP in 10 days          Current Discharge Medication  List    START taking these medications   Details  polyethylene glycol (MIRALAX / GLYCOLAX) packet Take 17 g by mouth daily. Hold for diarrhea Qty: 30 each, Refills: 1      CONTINUE these medications which have NOT CHANGED   Details  amLODipine (NORVASC) 2.5 MG tablet Take 2.5 mg by mouth daily.  Refills: 10    ARIPiprazole (ABILIFY) 5 MG tablet Take 5 mg by mouth daily. Refills: 1    cholecalciferol (VITAMIN D) 1000 UNITS tablet Take 2 tablets (2,000 Units total) by mouth daily. Qty: 60 tablet, Refills: 0    divalproex (DEPAKOTE ER) 500 MG 24 hr tablet Take 2 tablets (1,000 mg total) by mouth at bedtime. Take total of 1,000 mg total at bedtime. Qty: 60 tablet, Refills: 0    DULoxetine (CYMBALTA) 60 MG capsule Take 2 capsules (120 mg total) by mouth at bedtime. Qty: 60 capsule, Refills: 0    lamoTRIgine (LAMICTAL) 200 MG tablet Take 200 mg by mouth daily.  Refills: 0    LevOCARNitine (CARNITINE PO) Take 500 mg by mouth daily.    losartan (COZAAR) 50 MG tablet Take 1 tablet (50 mg total) by mouth daily. Qty: 30 tablet, Refills: 0    promethazine (PHENERGAN) 25 MG tablet Take 25 mg by mouth every 6 (six) hours as needed. For nausea Refills: 0    propranolol (INDERAL) 20 MG tablet Take 20-40 mg by mouth daily as needed (for hand tremors.).  Refills: 0    ULORIC 80 MG TABS Take 40 mg by mouth daily. Refills: 4       Allergies  Allergen Reactions  . Allopurinol Other (See Comments)    emotionally labile  . Aspirin Hives  . Penicillins Hives    Has patient had a PCN reaction causing immediate rash, facial/tongue/throat swelling, SOB or lightheadedness with hypotension:YES Has patient had a PCN reaction causing severe rash involving mucus membranes or skin necrosis:Yes Has patient had a PCN reaction that required hospitalization:NO Has patient had a PCN reaction occurring within the last 10 years:No If all of the above answers are "NO", then may proceed with  Cephalosporin use.   . Ciprofloxacin Rash   Follow-up Information    Follow up with GATES,ROBERT NEVILL, MD. Schedule an appointment as soon as possible for a visit in 10 days.   Specialty:  Internal Medicine   Contact information:   [address] [place] [address] [phone]       The results of significant diagnostics from this hospitalization (including imaging, microbiology, ancillary and laboratory) are listed below for reference.    Significant Diagnostic Studies: Ct Abdomen Pelvis Wo Contrast  05/19/2016  CLINICAL DATA:  60 year old male with abdominal pain and vomiting EXAM: CT ABDOMEN AND PELVIS WITHOUT CONTRAST TECHNIQUE: Multidetector CT imaging of the abdomen and pelvis was performed following the standard protocol without IV contrast. COMPARISON:  Abdominal CT dated 03/30/2014 FINDINGS: Evaluation of this exam is limited in the absence of intravenous contrast. Bibasilar linear atelectasis/scarring. The visualized lung bases are otherwise clear. No intra-abdominal free air or free fluid. Diffuse fatty infiltration of the liver. Small stones noted within the gallbladder. No pericholecystic fluid. Ultrasound may provide better evaluation  of the gallbladder if clinically indicated. The pancreas, spleen, adrenal glands, appear unremarkable. There is no hydronephrosis or nephrolithiasis on either side. A subcentimeter high attenuating left renal lesion is not well characterized but may represent a complex/hemorrhagic cyst. Ultrasound is recommended for further evaluation. The visualized ureters and urinary bladder appear unremarkable. The prostate and seminal vesicles are grossly unremarkable. There multiple mildly dilated loops of small bowel in the abdomen measuring up to 4 cm in the left upper abdomen. Loops of nondilated small bowel noted in the lower abdomen. No transition zone identified. Findings may represent an ileus versus a partial/low grade small bowel  obstruction secondary to underlying adhesions. Moderate stool throughout the colon. Appendectomy. There is mild aortoiliac atherosclerotic disease. No portal venous gas identified. There is no adenopathy. There is diastases of anterior abdominal wall musculature and level of the umbilicus. An umbilical hernia repair mesh is noted. The abdominal wall soft tissues are otherwise unremarkable. The osseous structures are intact. IMPRESSION: Ileus versus low-grade/partial small-bowel obstruction secondary to adhesions. Clinical correlation is recommended. Electronically Signed   By: Elgie Collard M.D.   On: 05/19/2016 00:50   Dg Abd 2 Views  05/20/2016  CLINICAL DATA:  Pt c/o RLQ pain, admitted x 3 days ago for sbo. EXAM: ABDOMEN - 2 VIEW COMPARISON:  CT 05/18/2016 FINDINGS: No free air. Small bowel and colon are nondilated. Residual oral contrast in the colon. Bilateral pelvic phleboliths. Mild degenerative changes in bilateral hips. IMPRESSION: 1. Nonobstructive bowel gas pattern. Electronically Signed   By: Corlis Leak M.D.   On: 05/20/2016 09:29   Labs: Basic Metabolic Panel:  Recent Labs Lab 05/17/16 1220 05/18/16 2140 05/19/16 0438 05/20/16 0410  NA 138 139 139 144  K 3.9 3.0* 3.8 3.7  CL 106 107 108 112*  CO2 20* 20* 22 26  GLUCOSE 149* 135* 110* 107*  BUN 25* CREATININE 1.42* 1.27* 1.18 1.08  CALCIUM 8.7* 8.4* 8.2* 8.6*  MG  --   --  1.8 2.0   Liver Function Tests:  Recent Labs Lab 05/17/16 1220 05/18/16 2140  AST 29 20  ALT 28 25  ALKPHOS 55 54  BILITOT 0.9 0.4  PROT 6.9 7.3  ALBUMIN 4.0 4.2    Recent Labs Lab 05/17/16 1220 05/18/16 2140  LIPASE 19 26   CBC:  Recent Labs Lab 05/17/16 1220 05/18/16 2140 05/19/16 0438 05/20/16 0410  WBC 5.2 8.4 9.0 10.9*  HGB 15.2 13.7 12.6* 11.6*  HCT 44.3 39.1 36.4* 33.0*  MCV 91.0 88.5 89.7 89.7  PLT 314 278 262 235   CBG:  Recent Labs Lab 05/19/16 0814 05/20/16 0737  GLUCAP 110* 83   Signed:  Vassie Loll MD.  [facility] 05/20/2016, 3:43 PM

## 2016-05-23 DIAGNOSIS — F3181 Bipolar II disorder: Secondary | ICD-10-CM | POA: Diagnosis not present

## 2016-05-25 DIAGNOSIS — F3181 Bipolar II disorder: Secondary | ICD-10-CM | POA: Diagnosis not present

## 2016-06-04 DIAGNOSIS — Z1211 Encounter for screening for malignant neoplasm of colon: Secondary | ICD-10-CM | POA: Diagnosis not present

## 2016-06-04 DIAGNOSIS — R1031 Right lower quadrant pain: Secondary | ICD-10-CM | POA: Diagnosis not present

## 2016-06-04 DIAGNOSIS — K567 Ileus, unspecified: Secondary | ICD-10-CM | POA: Diagnosis not present

## 2016-06-11 DIAGNOSIS — Z1212 Encounter for screening for malignant neoplasm of rectum: Secondary | ICD-10-CM | POA: Diagnosis not present

## 2016-06-11 DIAGNOSIS — Z1211 Encounter for screening for malignant neoplasm of colon: Secondary | ICD-10-CM | POA: Diagnosis not present

## 2016-06-18 DIAGNOSIS — F3181 Bipolar II disorder: Secondary | ICD-10-CM | POA: Diagnosis not present

## 2016-06-27 DIAGNOSIS — M1009 Idiopathic gout, multiple sites: Secondary | ICD-10-CM | POA: Diagnosis not present

## 2016-06-27 DIAGNOSIS — Z79899 Other long term (current) drug therapy: Secondary | ICD-10-CM | POA: Diagnosis not present

## 2016-07-06 DIAGNOSIS — K567 Ileus, unspecified: Secondary | ICD-10-CM | POA: Diagnosis not present

## 2016-07-06 DIAGNOSIS — K5900 Constipation, unspecified: Secondary | ICD-10-CM | POA: Diagnosis not present

## 2016-07-06 DIAGNOSIS — F3181 Bipolar II disorder: Secondary | ICD-10-CM | POA: Diagnosis not present

## 2016-07-06 DIAGNOSIS — F329 Major depressive disorder, single episode, unspecified: Secondary | ICD-10-CM | POA: Diagnosis not present

## 2016-07-11 DIAGNOSIS — F3181 Bipolar II disorder: Secondary | ICD-10-CM | POA: Diagnosis not present

## 2016-08-08 DIAGNOSIS — F3181 Bipolar II disorder: Secondary | ICD-10-CM | POA: Diagnosis not present

## 2016-08-31 DIAGNOSIS — F3181 Bipolar II disorder: Secondary | ICD-10-CM | POA: Diagnosis not present

## 2016-09-05 DIAGNOSIS — F3181 Bipolar II disorder: Secondary | ICD-10-CM | POA: Diagnosis not present

## 2016-10-03 DIAGNOSIS — F3181 Bipolar II disorder: Secondary | ICD-10-CM | POA: Diagnosis not present

## 2016-10-16 ENCOUNTER — Ambulatory Visit
Admission: RE | Admit: 2016-10-16 | Discharge: 2016-10-16 | Disposition: A | Discharge status: Home / Self Care | Payer: BLUE CROSS/BLUE SHIELD | Source: Ambulatory Visit | Attending: Nurse Practitioner | Admitting: Nurse Practitioner

## 2016-10-16 ENCOUNTER — Other Ambulatory Visit: Payer: Self-pay | Admitting: Nurse Practitioner

## 2016-10-16 DIAGNOSIS — S6992XA Unspecified injury of left wrist, hand and finger(s), initial encounter: Secondary | ICD-10-CM

## 2016-10-16 DIAGNOSIS — M79645 Pain in left finger(s): Secondary | ICD-10-CM | POA: Diagnosis not present

## 2016-10-16 DIAGNOSIS — M7989 Other specified soft tissue disorders: Secondary | ICD-10-CM | POA: Diagnosis not present

## 2016-10-16 DIAGNOSIS — Z23 Encounter for immunization: Secondary | ICD-10-CM | POA: Diagnosis not present

## 2016-10-16 IMAGING — CR DG FINGER MIDDLE 2+V*L*
3 series · 3 of 3 positions shown · non-contrast
Comparison: None.

CLINICAL DATA: Injured the left third digit with pain and swelling

EXAM:
LEFT MIDDLE FINGER 2+V

[x finger pa left]
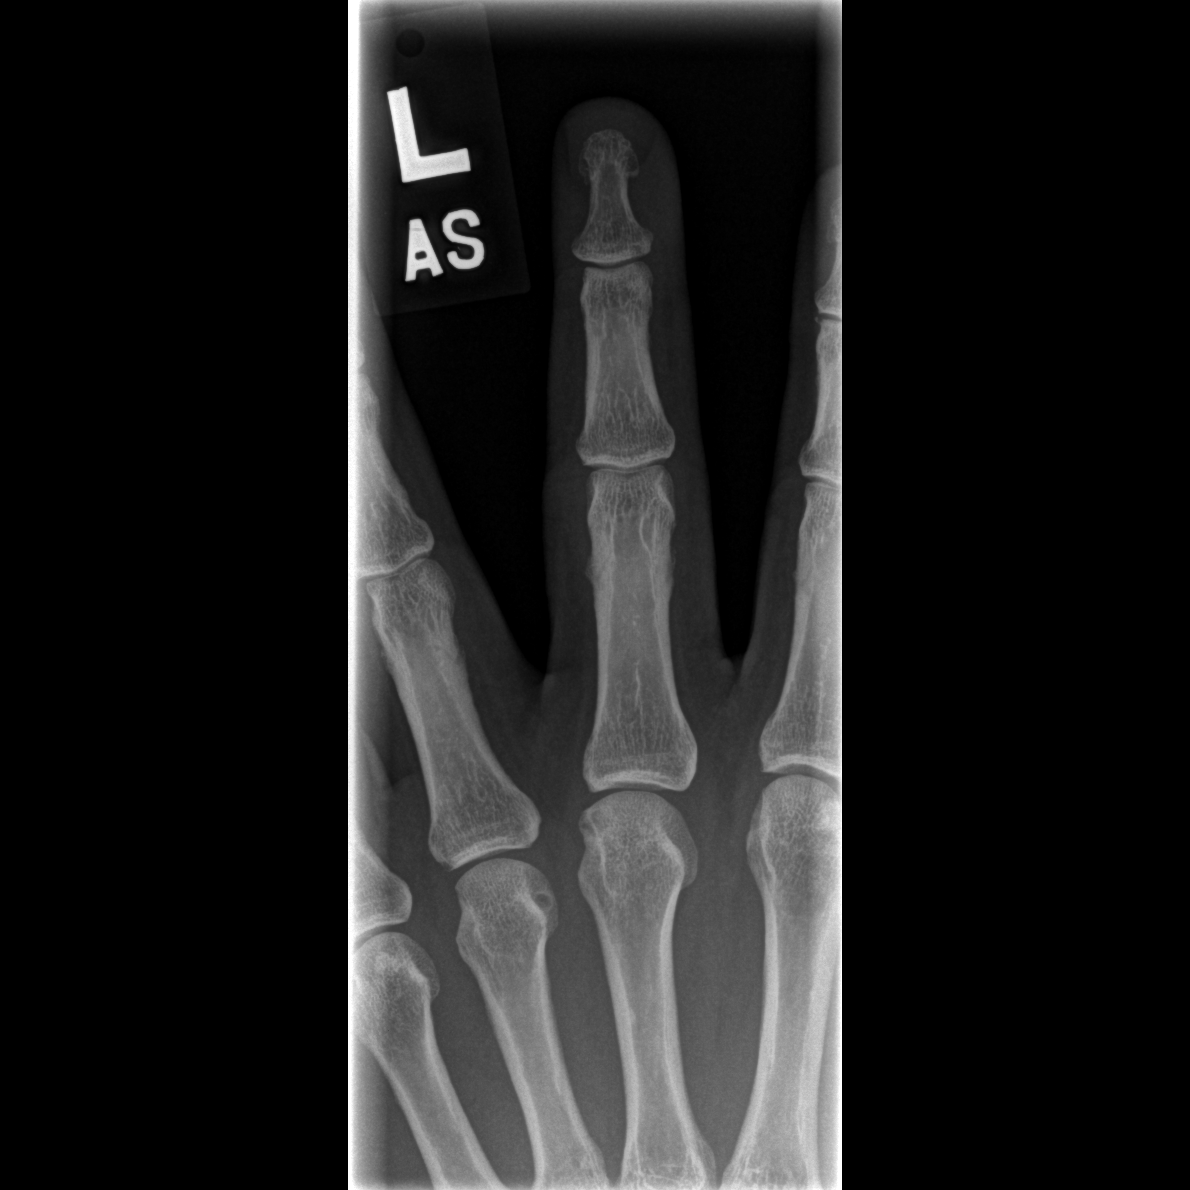

[x finger obl. left]
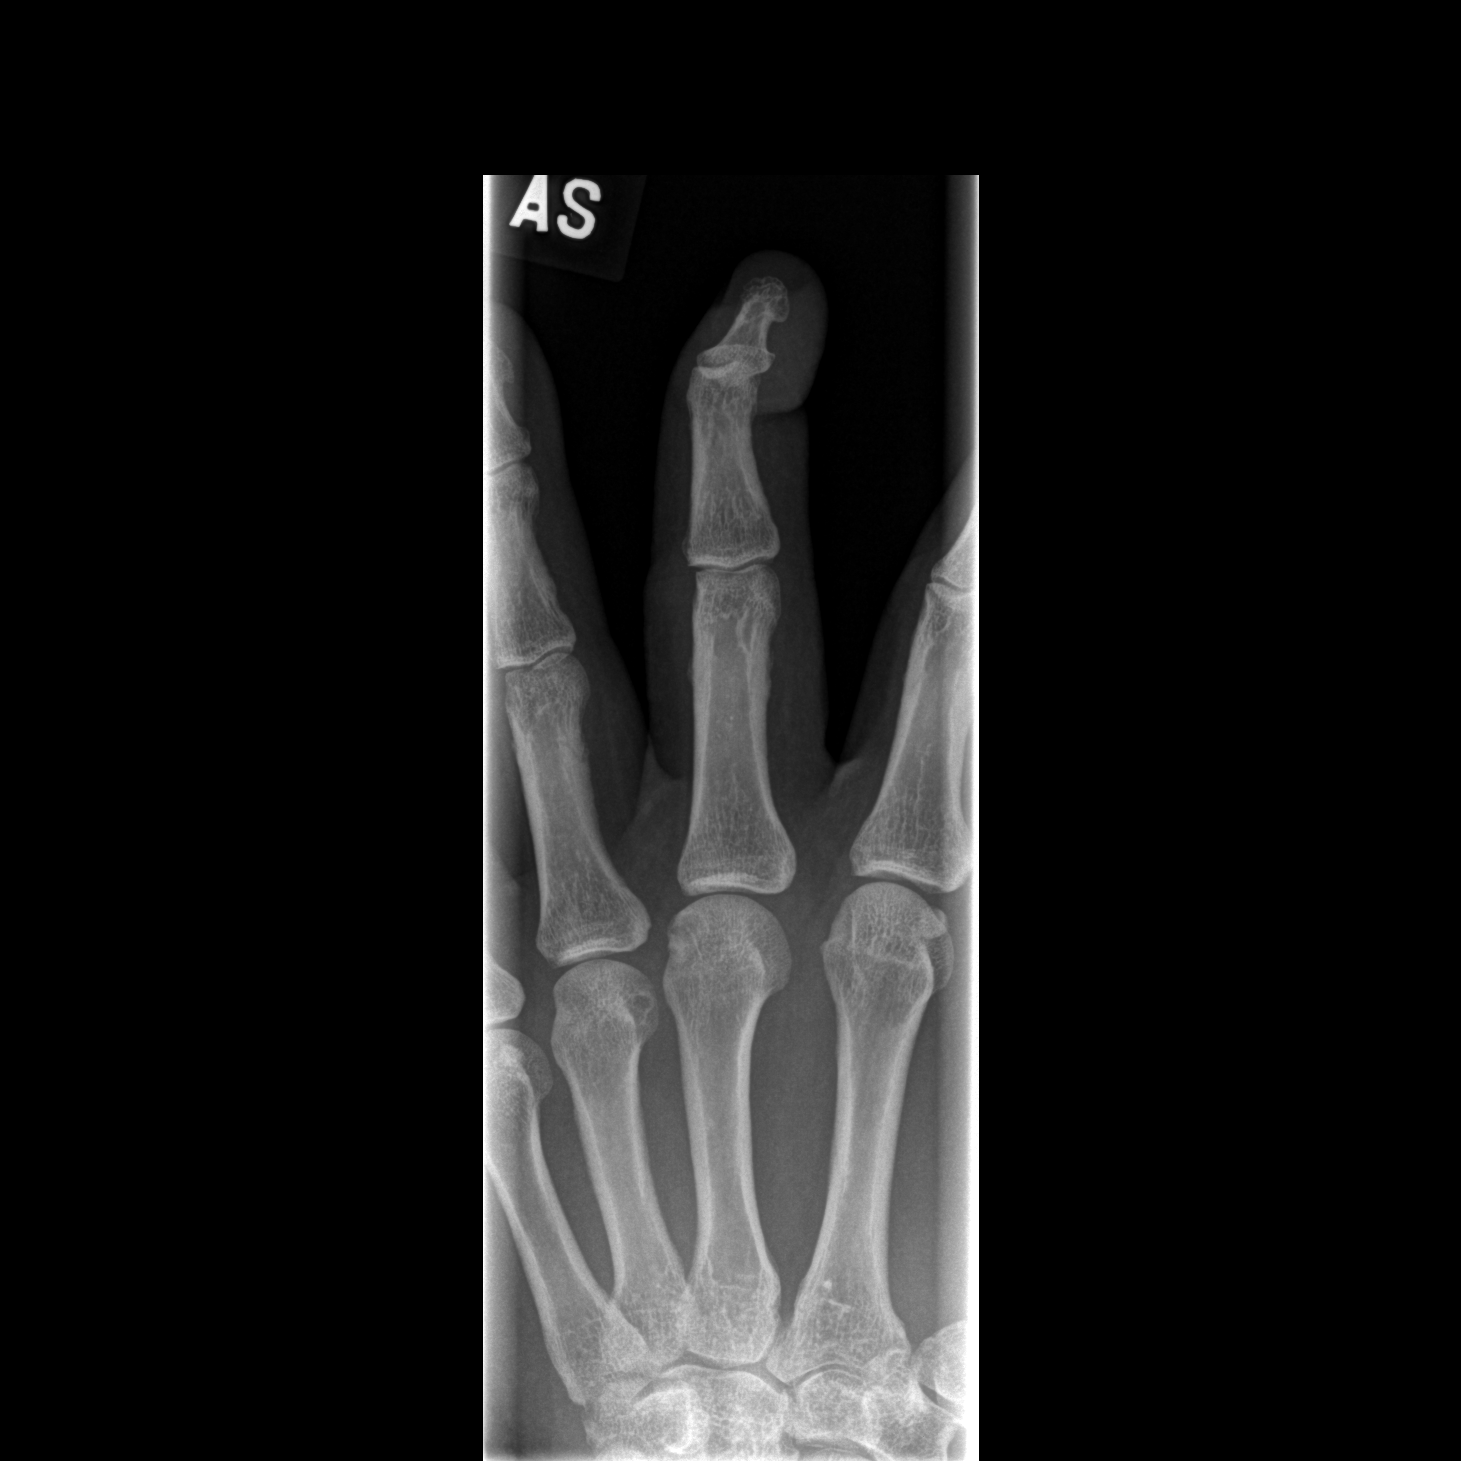

[x finger lateral left]
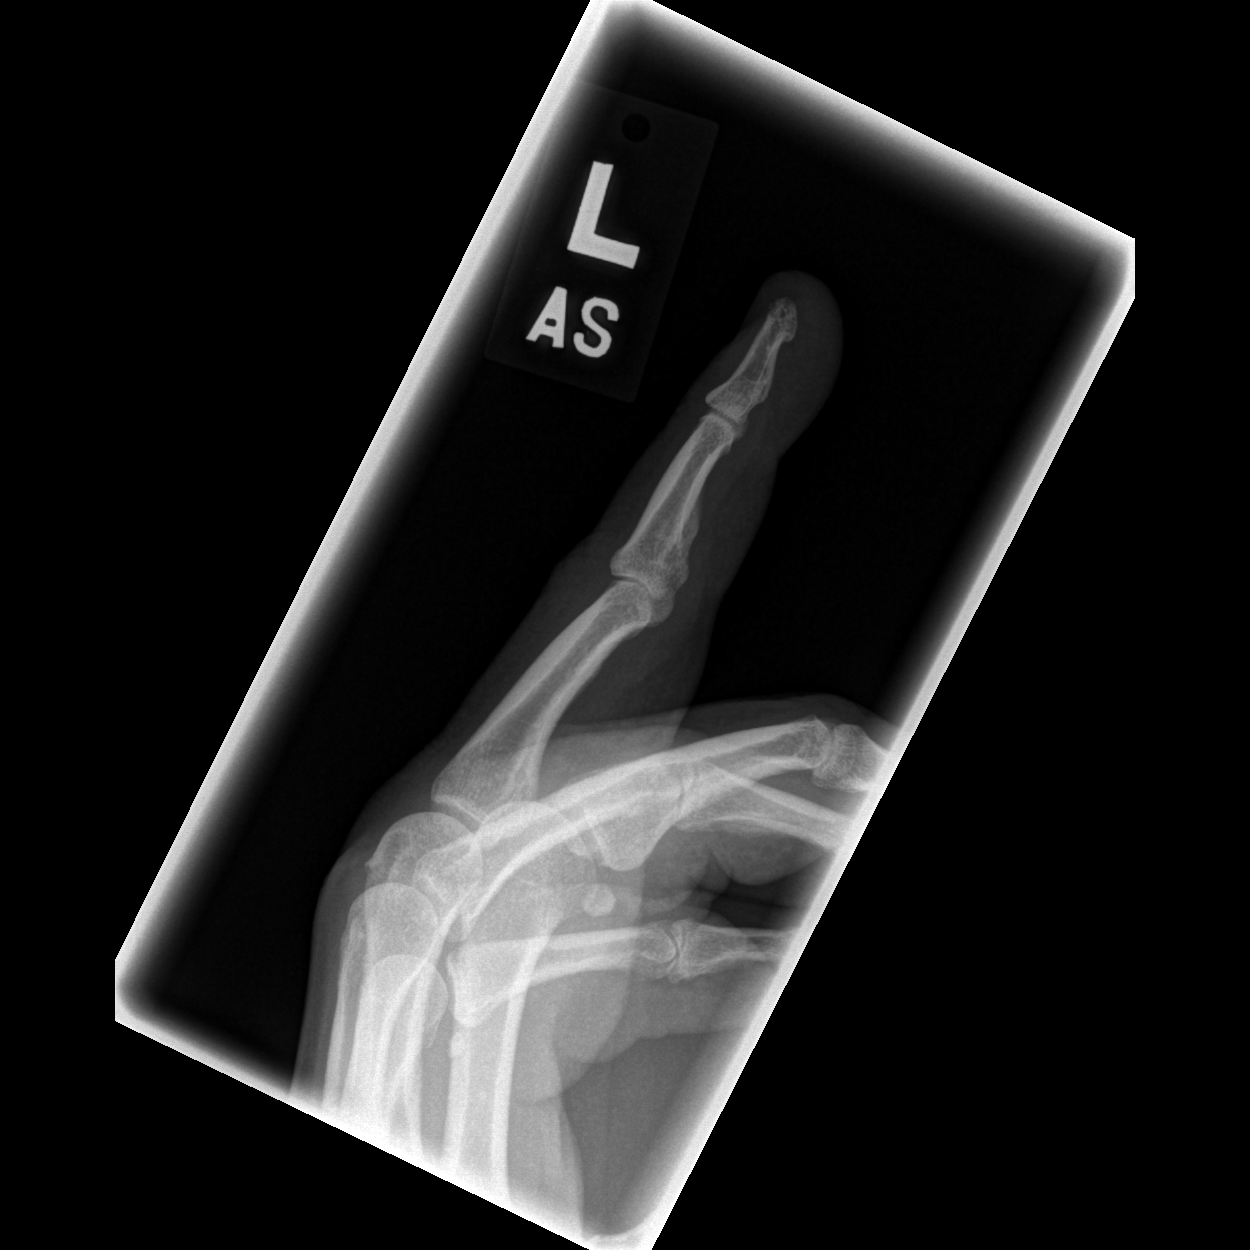

[3 of 3 positions shown; findings below may reference images not displayed]

FINDINGS: The left third DIP joint is held somewhat in flexion. No acute
fracture is seen. Alignment is normal. Joint spaces appear normal.
IMPRESSION: No acute fracture.

## 2016-10-17 DIAGNOSIS — M79645 Pain in left finger(s): Secondary | ICD-10-CM | POA: Diagnosis not present

## 2016-10-17 DIAGNOSIS — M25442 Effusion, left hand: Secondary | ICD-10-CM | POA: Diagnosis not present

## 2016-10-17 DIAGNOSIS — M20012 Mallet finger of left finger(s): Secondary | ICD-10-CM | POA: Diagnosis not present

## 2016-10-19 DIAGNOSIS — F3181 Bipolar II disorder: Secondary | ICD-10-CM | POA: Diagnosis not present

## 2016-10-24 DIAGNOSIS — M20012 Mallet finger of left finger(s): Secondary | ICD-10-CM | POA: Diagnosis not present

## 2016-10-31 DIAGNOSIS — L304 Erythema intertrigo: Secondary | ICD-10-CM | POA: Diagnosis not present

## 2016-10-31 DIAGNOSIS — D485 Neoplasm of uncertain behavior of skin: Secondary | ICD-10-CM | POA: Diagnosis not present

## 2016-10-31 DIAGNOSIS — L720 Epidermal cyst: Secondary | ICD-10-CM | POA: Diagnosis not present

## 2016-11-16 DIAGNOSIS — M20012 Mallet finger of left finger(s): Secondary | ICD-10-CM | POA: Diagnosis not present

## 2016-11-17 DIAGNOSIS — J069 Acute upper respiratory infection, unspecified: Secondary | ICD-10-CM | POA: Diagnosis not present

## 2016-11-21 DIAGNOSIS — M20012 Mallet finger of left finger(s): Secondary | ICD-10-CM | POA: Diagnosis not present

## 2016-11-21 DIAGNOSIS — M25442 Effusion, left hand: Secondary | ICD-10-CM | POA: Diagnosis not present

## 2016-11-28 DIAGNOSIS — F3181 Bipolar II disorder: Secondary | ICD-10-CM | POA: Diagnosis not present

## 2016-12-14 DIAGNOSIS — M20012 Mallet finger of left finger(s): Secondary | ICD-10-CM | POA: Diagnosis not present

## 2016-12-17 DIAGNOSIS — F3181 Bipolar II disorder: Secondary | ICD-10-CM | POA: Diagnosis not present

## 2016-12-21 DIAGNOSIS — M20012 Mallet finger of left finger(s): Secondary | ICD-10-CM | POA: Diagnosis not present

## 2016-12-26 DIAGNOSIS — M20012 Mallet finger of left finger(s): Secondary | ICD-10-CM | POA: Diagnosis not present

## 2017-01-01 DIAGNOSIS — F3181 Bipolar II disorder: Secondary | ICD-10-CM | POA: Diagnosis not present

## 2017-01-04 ENCOUNTER — Encounter: Payer: Self-pay | Admitting: Cardiology

## 2017-01-08 DIAGNOSIS — I10 Essential (primary) hypertension: Secondary | ICD-10-CM | POA: Diagnosis not present

## 2017-01-08 DIAGNOSIS — N183 Chronic kidney disease, stage 3 (moderate): Secondary | ICD-10-CM | POA: Diagnosis not present

## 2017-01-08 DIAGNOSIS — Z0000 Encounter for general adult medical examination without abnormal findings: Secondary | ICD-10-CM | POA: Diagnosis not present

## 2017-01-08 DIAGNOSIS — N390 Urinary tract infection, site not specified: Secondary | ICD-10-CM | POA: Diagnosis not present

## 2017-01-08 DIAGNOSIS — F329 Major depressive disorder, single episode, unspecified: Secondary | ICD-10-CM | POA: Diagnosis not present

## 2017-01-08 DIAGNOSIS — M109 Gout, unspecified: Secondary | ICD-10-CM | POA: Diagnosis not present

## 2017-01-08 DIAGNOSIS — R972 Elevated prostate specific antigen [PSA]: Secondary | ICD-10-CM | POA: Diagnosis not present

## 2017-01-08 DIAGNOSIS — Z125 Encounter for screening for malignant neoplasm of prostate: Secondary | ICD-10-CM | POA: Diagnosis not present

## 2017-01-08 DIAGNOSIS — E559 Vitamin D deficiency, unspecified: Secondary | ICD-10-CM | POA: Diagnosis not present

## 2017-01-08 DIAGNOSIS — E782 Mixed hyperlipidemia: Secondary | ICD-10-CM | POA: Diagnosis not present

## 2017-01-08 DIAGNOSIS — Z79899 Other long term (current) drug therapy: Secondary | ICD-10-CM | POA: Diagnosis not present

## 2017-01-11 DIAGNOSIS — M20012 Mallet finger of left finger(s): Secondary | ICD-10-CM | POA: Diagnosis not present

## 2017-01-25 DIAGNOSIS — M20012 Mallet finger of left finger(s): Secondary | ICD-10-CM | POA: Diagnosis not present

## 2017-02-01 ENCOUNTER — Encounter (HOSPITAL_COMMUNITY): Payer: Self-pay | Admitting: Family Medicine

## 2017-02-01 ENCOUNTER — Inpatient Hospital Stay (HOSPITAL_COMMUNITY)
Admission: EM | Admit: 2017-02-01 | Discharge: 2017-02-04 | DRG: 390 | Disposition: A | Discharge status: Home / Self Care | Payer: BLUE CROSS/BLUE SHIELD | Attending: Internal Medicine | Admitting: Internal Medicine

## 2017-02-01 ENCOUNTER — Emergency Department (HOSPITAL_COMMUNITY): Payer: BLUE CROSS/BLUE SHIELD

## 2017-02-01 DIAGNOSIS — R1115 Cyclical vomiting syndrome unrelated to migraine: Secondary | ICD-10-CM | POA: Diagnosis present

## 2017-02-01 DIAGNOSIS — R140 Abdominal distension (gaseous): Secondary | ICD-10-CM | POA: Diagnosis not present

## 2017-02-01 DIAGNOSIS — K50019 Crohn's disease of small intestine with unspecified complications: Secondary | ICD-10-CM | POA: Diagnosis not present

## 2017-02-01 DIAGNOSIS — K56600 Partial intestinal obstruction, unspecified as to cause: Principal | ICD-10-CM | POA: Diagnosis present

## 2017-02-01 DIAGNOSIS — N182 Chronic kidney disease, stage 2 (mild): Secondary | ICD-10-CM | POA: Diagnosis present

## 2017-02-01 DIAGNOSIS — R1084 Generalized abdominal pain: Secondary | ICD-10-CM | POA: Diagnosis not present

## 2017-02-01 DIAGNOSIS — Z8249 Family history of ischemic heart disease and other diseases of the circulatory system: Secondary | ICD-10-CM | POA: Diagnosis not present

## 2017-02-01 DIAGNOSIS — Z888 Allergy status to other drugs, medicaments and biological substances status: Secondary | ICD-10-CM

## 2017-02-01 DIAGNOSIS — K56609 Unspecified intestinal obstruction, unspecified as to partial versus complete obstruction: Secondary | ICD-10-CM | POA: Diagnosis not present

## 2017-02-01 DIAGNOSIS — Z880 Allergy status to penicillin: Secondary | ICD-10-CM | POA: Diagnosis not present

## 2017-02-01 DIAGNOSIS — E669 Obesity, unspecified: Secondary | ICD-10-CM | POA: Diagnosis not present

## 2017-02-01 DIAGNOSIS — K6389 Other specified diseases of intestine: Secondary | ICD-10-CM | POA: Diagnosis not present

## 2017-02-01 DIAGNOSIS — E876 Hypokalemia: Secondary | ICD-10-CM | POA: Diagnosis present

## 2017-02-01 DIAGNOSIS — F319 Bipolar disorder, unspecified: Secondary | ICD-10-CM | POA: Diagnosis not present

## 2017-02-01 DIAGNOSIS — Z79899 Other long term (current) drug therapy: Secondary | ICD-10-CM

## 2017-02-01 DIAGNOSIS — Z6832 Body mass index (BMI) 32.0-32.9, adult: Secondary | ICD-10-CM

## 2017-02-01 DIAGNOSIS — R1110 Vomiting, unspecified: Secondary | ICD-10-CM | POA: Diagnosis not present

## 2017-02-01 DIAGNOSIS — I10 Essential (primary) hypertension: Secondary | ICD-10-CM | POA: Diagnosis present

## 2017-02-01 DIAGNOSIS — I129 Hypertensive chronic kidney disease with stage 1 through stage 4 chronic kidney disease, or unspecified chronic kidney disease: Secondary | ICD-10-CM | POA: Diagnosis present

## 2017-02-01 DIAGNOSIS — F3132 Bipolar disorder, current episode depressed, moderate: Secondary | ICD-10-CM | POA: Diagnosis present

## 2017-02-01 DIAGNOSIS — Z886 Allergy status to analgesic agent status: Secondary | ICD-10-CM | POA: Diagnosis not present

## 2017-02-01 DIAGNOSIS — K5090 Crohn's disease, unspecified, without complications: Secondary | ICD-10-CM

## 2017-02-01 DIAGNOSIS — N183 Chronic kidney disease, stage 3 unspecified: Secondary | ICD-10-CM | POA: Diagnosis present

## 2017-02-01 DIAGNOSIS — K529 Noninfective gastroenteritis and colitis, unspecified: Secondary | ICD-10-CM | POA: Diagnosis present

## 2017-02-01 DIAGNOSIS — F314 Bipolar disorder, current episode depressed, severe, without psychotic features: Secondary | ICD-10-CM | POA: Diagnosis not present

## 2017-02-01 DIAGNOSIS — R112 Nausea with vomiting, unspecified: Secondary | ICD-10-CM | POA: Diagnosis not present

## 2017-02-01 DIAGNOSIS — K8020 Calculus of gallbladder without cholecystitis without obstruction: Secondary | ICD-10-CM | POA: Diagnosis not present

## 2017-02-01 DIAGNOSIS — Z881 Allergy status to other antibiotic agents status: Secondary | ICD-10-CM

## 2017-02-01 DIAGNOSIS — M109 Gout, unspecified: Secondary | ICD-10-CM | POA: Diagnosis not present

## 2017-02-01 HISTORY — DX: Suicide attempt, initial encounter: T14.91XA

## 2017-02-01 HISTORY — DX: Chronic kidney disease, stage 2 (mild): N18.2

## 2017-02-01 LAB — CBC WITH DIFFERENTIAL/PLATELET
BASOS ABS: 0.2 10*3/uL — AB (ref 0.0–0.1)
BASOS PCT: 2 %
EOS ABS: 0.1 10*3/uL (ref 0.0–0.7)
EOS PCT: 1 %
HCT: 37.9 % — ABNORMAL LOW (ref 39.0–52.0)
Hemoglobin: 13.2 g/dL (ref 13.0–17.0)
Lymphocytes Relative: 16 %
Lymphs Abs: 1.7 10*3/uL (ref 0.7–4.0)
MCH: 30.4 pg (ref 26.0–34.0)
MCHC: 34.8 g/dL (ref 30.0–36.0)
MCV: 87.3 fL (ref 78.0–100.0)
MONO ABS: 2.2 10*3/uL — AB (ref 0.1–1.0)
Monocytes Relative: 21 %
NEUTROS ABS: 6.4 10*3/uL (ref 1.7–7.7)
Neutrophils Relative %: 61 %
PLATELETS: 333 10*3/uL (ref 150–400)
RBC: 4.34 MIL/uL (ref 4.22–5.81)
RDW: 13.4 % (ref 11.5–15.5)
WBC: 10.5 10*3/uL (ref 4.0–10.5)

## 2017-02-01 LAB — LIPASE, BLOOD: LIPASE: 70 U/L — AB (ref 11–51)

## 2017-02-01 LAB — COMPREHENSIVE METABOLIC PANEL
ALT: 27 U/L (ref 17–63)
ANION GAP: 11 (ref 5–15)
AST: 21 U/L (ref 15–41)
Albumin: 3.8 g/dL (ref 3.5–5.0)
Alkaline Phosphatase: 56 U/L (ref 38–126)
BILIRUBIN TOTAL: 0.6 mg/dL (ref 0.3–1.2)
BUN: 15 mg/dL (ref 6–20)
CO2: 22 mmol/L (ref 22–32)
Calcium: 8.8 mg/dL — ABNORMAL LOW (ref 8.9–10.3)
Chloride: 101 mmol/L (ref 101–111)
Creatinine, Ser: 1.21 mg/dL (ref 0.61–1.24)
GFR calc Af Amer: >60 mL/min (ref >60)
GFR calc non Af Amer: >60 mL/min (ref >60)
Glucose, Bld: 146 mg/dL — ABNORMAL HIGH (ref 65–99)
POTASSIUM: 3.1 mmol/L — AB (ref 3.5–5.1)
SODIUM: 134 mmol/L — AB (ref 135–145)
TOTAL PROTEIN: 6.9 g/dL (ref 6.5–8.1)

## 2017-02-01 LAB — URINALYSIS, ROUTINE W REFLEX MICROSCOPIC
Bacteria, UA: NONE SEEN
Bilirubin Urine: NEGATIVE
GLUCOSE, UA: NEGATIVE mg/dL
KETONES UR: NEGATIVE mg/dL
LEUKOCYTES UA: NEGATIVE
Nitrite: NEGATIVE
PROTEIN: NEGATIVE mg/dL
SQUAMOUS EPITHELIAL / LPF: NONE SEEN
Specific Gravity, Urine: 1.003 — ABNORMAL LOW (ref 1.005–1.030)
pH: 7 (ref 5.0–8.0)

## 2017-02-01 IMAGING — CT CT ABD-PELV W/ CM
2 of 5 series · 15 of 46 positions shown, 17 images · IV contrast (agent unspecified)
Comparison: [DATE]

CLINICAL DATA: Nausea and vomiting for 6 days. Abdominal
distention. White cell count 10.5. Elevated lipase. microhematuria.
History of hypertension, chronic kidney disease, prostatitis,
umbilical hernia repair, and appendectomy.

EXAM:
CT ABDOMEN AND PELVIS WITH CONTRAST
TECHNIQUE: Multidetector CT imaging of the abdomen and pelvis was performed
using the standard protocol following bolus administration of
intravenous contrast.
CONTRAST:  100 mL [O4]

[Series 2: abd/pel with · axial · 0.76mm/px · z∈[+1066,+1480]mm · 12 of 97 slices shown, 14 images]
[im 7/97  soft-tissue]
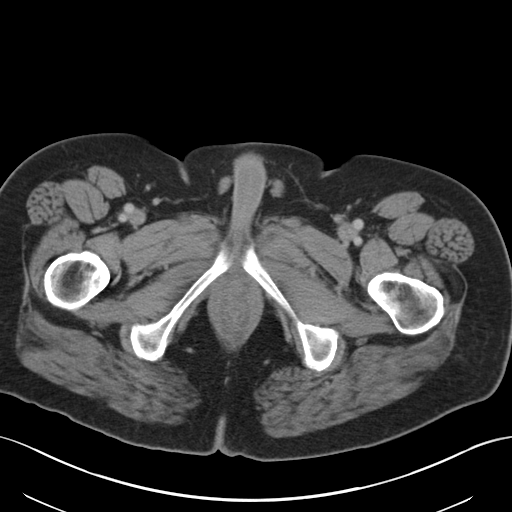
[im 7/97  bone]
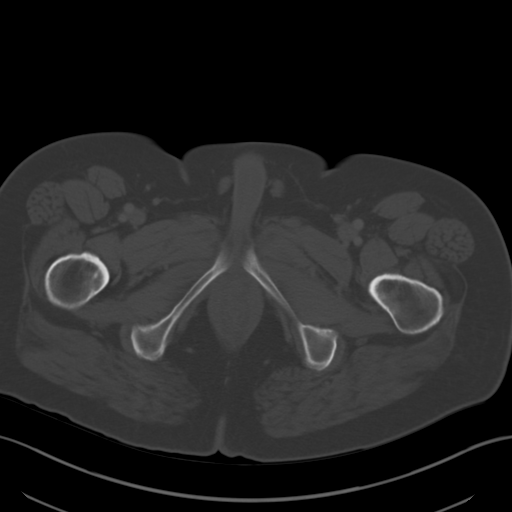
[im 13/97  soft-tissue]
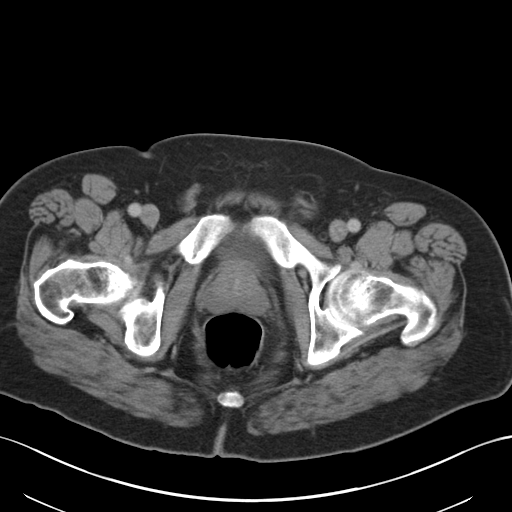
[im 20/97  soft-tissue]
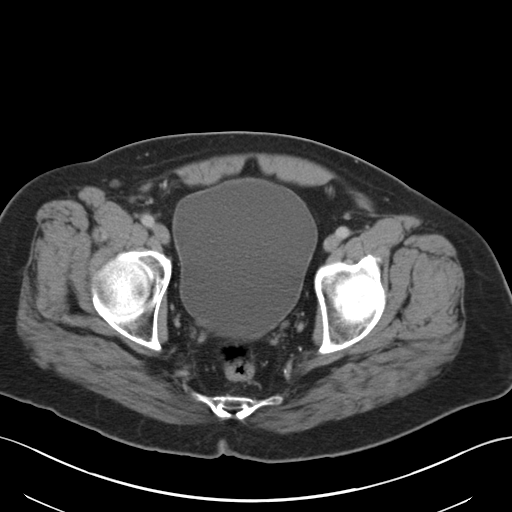
[im 33/97  soft-tissue]
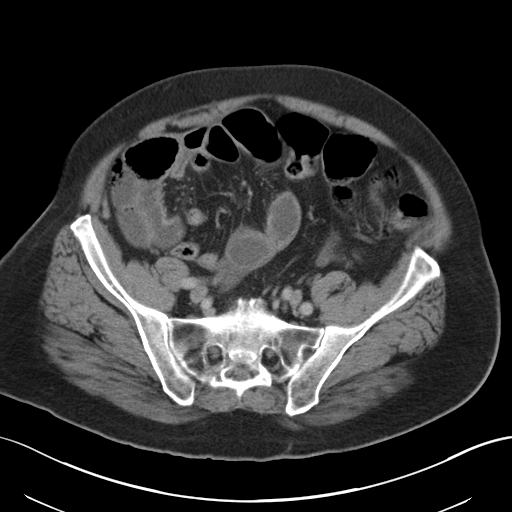
[im 39/97  soft-tissue]
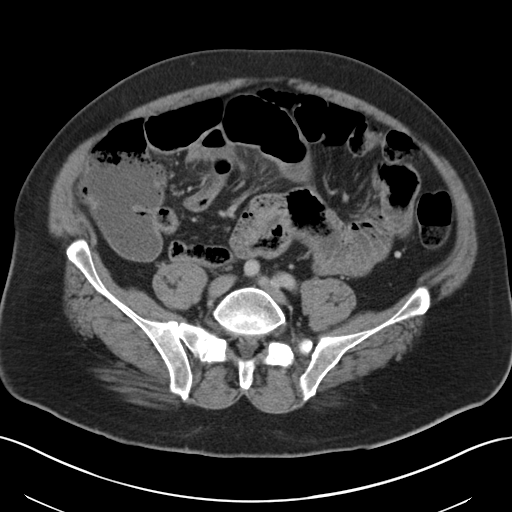
[im 45/97  soft-tissue]
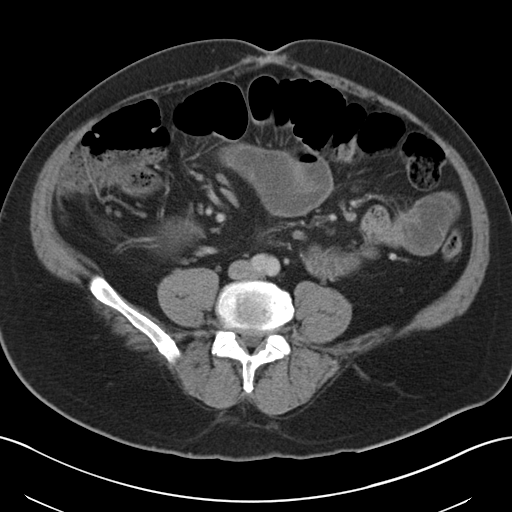
[im 52/97  soft-tissue]
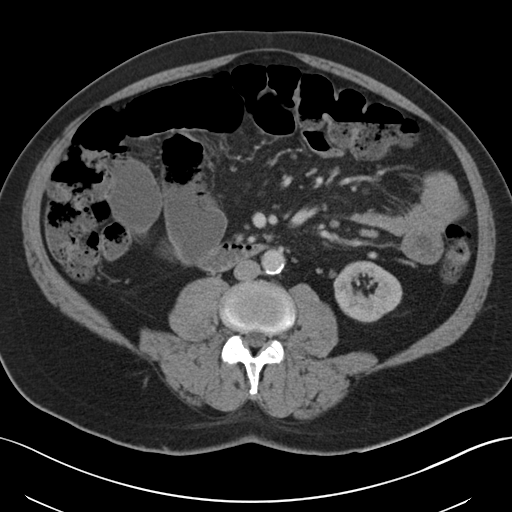
[im 58/97  soft-tissue]
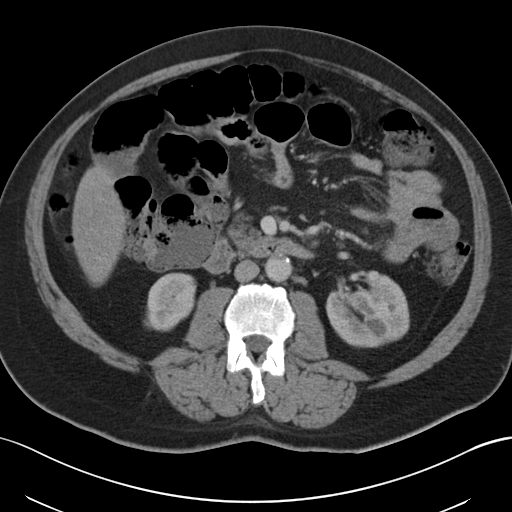
[im 65/97  soft-tissue]
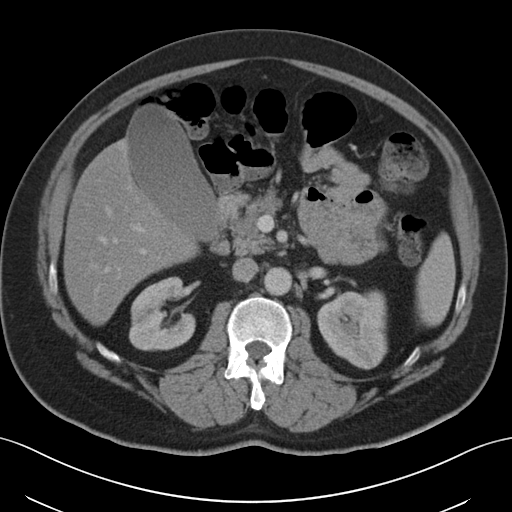
[im 65/97  bone]
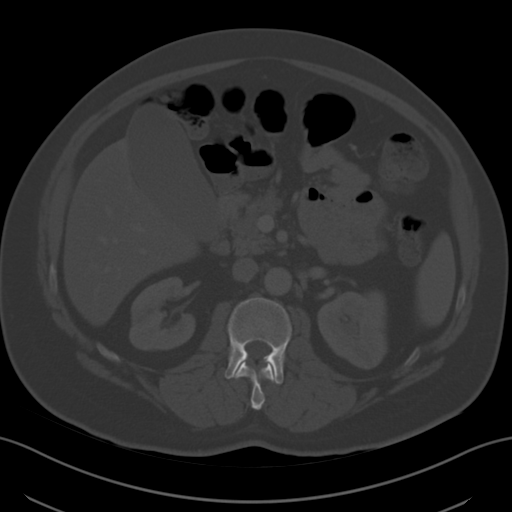
[im 77/97  soft-tissue]
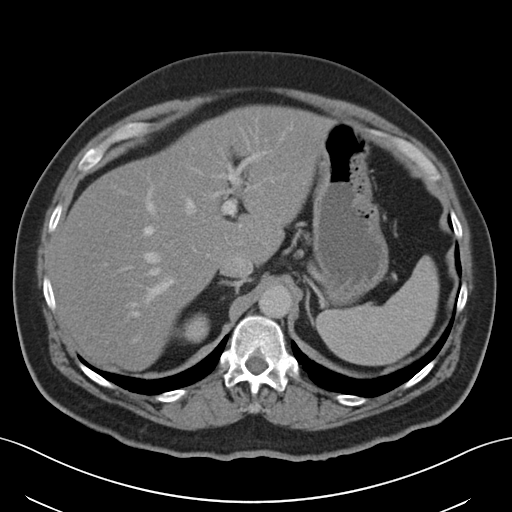
[im 84/97  soft-tissue]
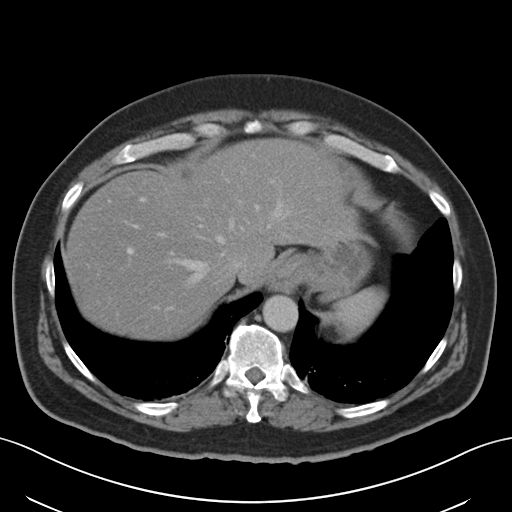
[im 90/97  soft-tissue]
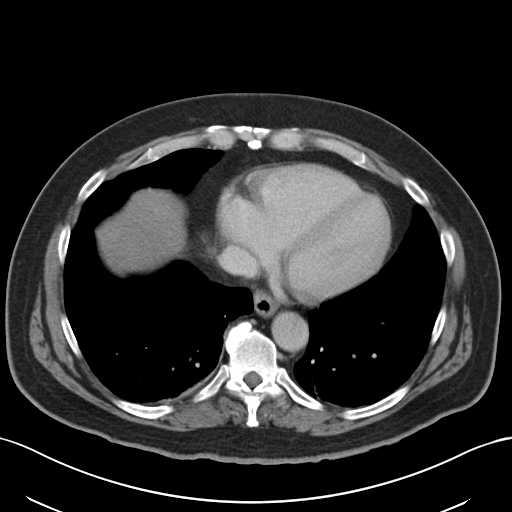

[Series 4: coronal a/|p · coronal · 0.74mm/px · 3 of 152 slices shown]
[im 51/152  soft-tissue]
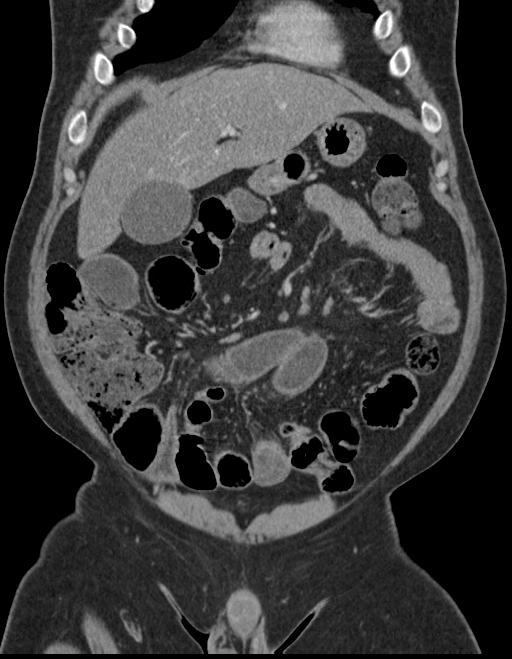
[im 68/152  soft-tissue]
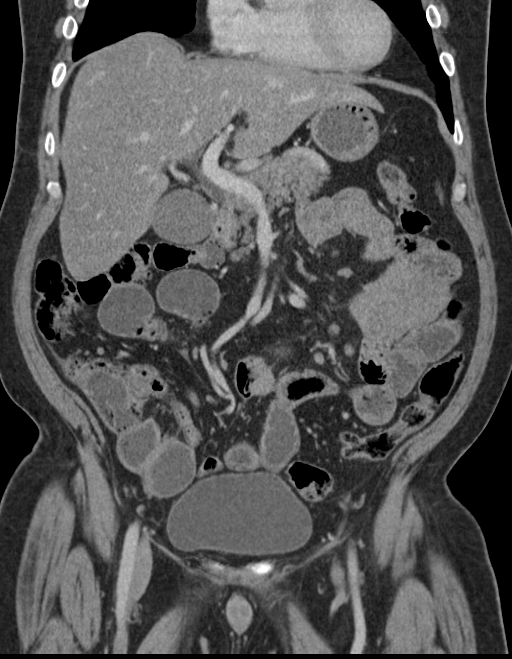
[im 84/152  soft-tissue]
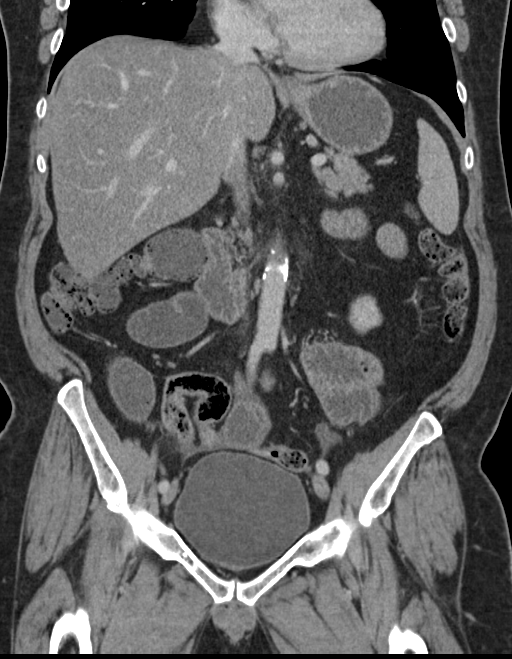

[15 of 46 positions shown; findings below may reference images not displayed]

FINDINGS: Lower chest: Mild dependent atelectasis in the lung bases. Linear
fibrosis also on the lung bases. Small esophageal hiatal hernia.

Hepatobiliary: Diffuse fatty infiltration of the liver. Small stones
layering in the gallbladder. Mild gallbladder distention without
wall thickening or edema. No bile duct dilatations.

Pancreas: Unremarkable. No pancreatic ductal dilatation or
surrounding inflammatory changes.

Spleen: Normal in size without focal abnormality.

Adrenals/Urinary Tract: No adrenal gland nodules. Small
subcentimeter cysts in the kidneys. Nephrograms are symmetrical and
homogeneous. No hydronephrosis or hydroureter. No bladder wall
thickening.

Stomach/Bowel: Fluid-filled mildly dilated distal small bowel with
mild wall thickening. Mesenteric edema and interloop fluid. Findings
may represent enteritis, early or partial small bowel obstruction,
inflammatory bowel disease. Ischemia is not excluded although no
pneumatosis or portal venous gas is present and visualized
mesenteric vessels appear patent. Colon is not abnormally distended.
Scattered stool throughout the colon. No inflammatory changes.
Appendix is surgically absent.

Vascular/Lymphatic: Aortic atherosclerosis. No enlarged abdominal or
pelvic lymph nodes.

Reproductive: Prostate is unremarkable. Small amount of free fluid
in the pelvis.

Other: No free air in the abdomen. Abdominal wall musculature
appears intact.

Musculoskeletal: Degenerative changes in the spine. No destructive
bone lesions.
IMPRESSION: Mid/distal fluid-filled mildly dilated small bowel with wall
thickening and mesenteric fluid. Changes likely represent enteritis
or early/ partial small bowel obstruction. Inflammatory bowel
disease or ischemia could also have this appearance but felt less
likely. Appearances are similar to prior study. Diffuse fatty
infiltration of the liver. Cholelithiasis.

## 2017-02-01 MED ORDER — SODIUM CHLORIDE 0.9 % IV SOLN
INTRAVENOUS | Status: AC
  Administered 2017-02-01: 06:00:00 via INTRAVENOUS

## 2017-02-01 MED ORDER — PRAMIPEXOLE DIHYDROCHLORIDE 0.25 MG PO TABS
0.5000 mg | ORAL_TABLET | Freq: Two times a day (BID) | ORAL | Status: DC
  Administered 2017-02-01 – 2017-02-04 (×7): 0.5 mg via ORAL
  Filled 2017-02-01 (×7): qty 2

## 2017-02-01 MED ORDER — POTASSIUM CHLORIDE 10 MEQ/100ML IV SOLN
10.0000 meq | INTRAVENOUS | Status: AC
  Administered 2017-02-01 (×3): 10 meq via INTRAVENOUS
  Filled 2017-02-01 (×3): qty 100

## 2017-02-01 MED ORDER — IOPAMIDOL (ISOVUE-300) INJECTION 61%
INTRAVENOUS | Status: AC
  Administered 2017-02-01: 100 mL via INTRAVENOUS
  Filled 2017-02-01: qty 100

## 2017-02-01 MED ORDER — LOSARTAN POTASSIUM 50 MG PO TABS
50.0000 mg | ORAL_TABLET | Freq: Every day | ORAL | Status: DC
  Administered 2017-02-01 – 2017-02-04 (×4): 50 mg via ORAL
  Filled 2017-02-01 (×4): qty 1

## 2017-02-01 MED ORDER — LIP MEDEX EX OINT
TOPICAL_OINTMENT | CUTANEOUS | Status: DC | PRN
  Administered 2017-02-01: 10:00:00 via TOPICAL
  Filled 2017-02-01: qty 7

## 2017-02-01 MED ORDER — IOPAMIDOL (ISOVUE-300) INJECTION 61%
100.0000 mL | Freq: Once | INTRAVENOUS | Status: AC | PRN
  Administered 2017-02-01: 100 mL via INTRAVENOUS

## 2017-02-01 MED ORDER — AMLODIPINE BESYLATE 5 MG PO TABS
2.5000 mg | ORAL_TABLET | Freq: Every day | ORAL | Status: DC
  Administered 2017-02-01 – 2017-02-04 (×4): 2.5 mg via ORAL
  Filled 2017-02-01 (×4): qty 1

## 2017-02-01 MED ORDER — DULOXETINE HCL 30 MG PO CPEP
120.0000 mg | ORAL_CAPSULE | Freq: Every day | ORAL | Status: DC
  Administered 2017-02-01 – 2017-02-03 (×3): 120 mg via ORAL
  Filled 2017-02-01: qty 4
  Filled 2017-02-01: qty 2
  Filled 2017-02-01: qty 4

## 2017-02-01 MED ORDER — LEVOCARNITINE 1 GM/10ML PO SOLN
500.0000 mg | Freq: Two times a day (BID) | ORAL | Status: DC
  Administered 2017-02-01 – 2017-02-04 (×7): 500 mg via ORAL
  Filled 2017-02-01 (×7): qty 5

## 2017-02-01 MED ORDER — L-CARNITINE 500 MG PO TABS: 500.0000 mg | ORAL_TABLET | Freq: Two times a day (BID) | ORAL | Status: DC

## 2017-02-01 MED ORDER — HYDRALAZINE HCL 20 MG/ML IJ SOLN: 5.0000 mg | Freq: Four times a day (QID) | INTRAMUSCULAR | Status: DC | PRN

## 2017-02-01 MED ORDER — ONDANSETRON HCL 4 MG/2ML IJ SOLN: 4.0000 mg | Freq: Three times a day (TID) | INTRAMUSCULAR | Status: AC | PRN

## 2017-02-01 MED ORDER — LAMOTRIGINE 100 MG PO TABS
200.0000 mg | ORAL_TABLET | Freq: Every day | ORAL | Status: DC
  Administered 2017-02-01 – 2017-02-04 (×4): 200 mg via ORAL
  Filled 2017-02-01 (×4): qty 2

## 2017-02-01 MED ORDER — ENOXAPARIN SODIUM 40 MG/0.4ML ~~LOC~~ SOLN
40.0000 mg | SUBCUTANEOUS | Status: DC
  Administered 2017-02-01 – 2017-02-03 (×3): 40 mg via SUBCUTANEOUS
  Filled 2017-02-01 (×3): qty 0.4

## 2017-02-01 MED ORDER — DIVALPROEX SODIUM ER 500 MG PO TB24
1000.0000 mg | ORAL_TABLET | Freq: Every day | ORAL | Status: DC
  Administered 2017-02-01 – 2017-02-03 (×3): 1000 mg via ORAL
  Filled 2017-02-01 (×3): qty 2

## 2017-02-01 MED ORDER — SODIUM CHLORIDE 0.9 % IV SOLN
INTRAVENOUS | Status: DC
  Administered 2017-02-01 – 2017-02-03 (×4): via INTRAVENOUS

## 2017-02-01 MED ORDER — SODIUM CHLORIDE 0.9 % IV SOLN: 30.0000 meq | Freq: Once | INTRAVENOUS | Status: DC

## 2017-02-01 NOTE — ED Triage Notes (Signed)
Patient reports he is experiencing nausea and vomiting with no abd pain. Pt reports symptoms started last Friday. Pt spoke with PCP on Monday about symptoms and was instructed about things to do at home. Symptoms have not improved.

## 2017-02-01 NOTE — ED Provider Notes (Addendum)
[facility]-EMERGENCY DEPT Provider Note: Lowella Dell, MD, FACEP  CSN: [id number] MRN: [id number] ARRIVAL: 02/01/17 at 0110 ROOM: [facility]08/[facility]08  By signing my name below, I, Elder Negus, attest that this documentation has been prepared under the direction and in the presence of Paula Libra, MD. Electronically Signed: Elder Negus, Scribe. 02/01/17. 2:08 AM.  CHIEF COMPLAINT  Vomiting   HISTORY OF PRESENT ILLNESS  Edwin [name] is a 61 y.o. male with history of HTN, CKD II, and prior ileus or partial SBO who presents to the ED for evaluation of nausea and vomiting. He has been unable to hold down solid food for the past 6 days. He called his physician who placed him on Zofran which he has been taking but without relief. Since that time he has been able to tolerate water, but continues to be unable to hold down solid food. Tonight he called his primary care office and spoke with a gastroenterologist who recommended presentation to this facility. Currently, he denies any nausea, abdominal pain, fevers, dyspnea. He is reporting abdominal distention distention. He is having nonbloody diarrhea as well. He describes his symptoms as similar to the episode of ileus or partial SBO for which he was admitted last year. He has not been able to keep down his psychiatric medications.    An IV fluid bolus was initiated by nursing staff prior to my evaluation.  Past Medical History:  Diagnosis Date  . Allergic rhinitis   . Bipolar disorder (HCC)   . 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 Gout   . Hypertension   . Mild renal insufficiency    , with creatinine of 1.3 in 2010, was side effect of med  . Obesity    ,moderate  . Prostatitis    , Episodic    Past Surgical History:  Procedure Laterality Date  . APPENDECTOMY    . CATARACT EXTRACTION Right   . COLONOSCOPY WITH PROPOFOL N/A 06/14/2014   Procedure: COLONOSCOPY WITH PROPOFOL;  Surgeon: Charolett Bumpers, MD;  Location: [facility];   Service: Endoscopy;  Laterality: N/A;  . TONSILLECTOMY    . UMBILICAL HERNIA REPAIR      Family History  Problem Relation Age of Onset  . Hypertension Father   . Gout Father   . Alzheimer's disease Father   . Other Mother     Viral Meningitis  . Mitral valve prolapse Mother   . Arrhythmia Mother   . Hypothyroidism Mother     Social History  Substance Use Topics  . Smoking status: Never Smoker  . Smokeless tobacco: Never Used  . Alcohol use No    Prior to Admission medications   Medication Sig Start Date End Date Taking? Authorizing Provider  amLODipine (NORVASC) 2.5 MG tablet Take 2.5 mg by mouth daily.    Yes Historical Provider, MD  cholecalciferol (VITAMIN D) 1000 UNITS tablet Take 2 tablets (2,000 Units total) by mouth daily. 09/23/14  Yes Adonis Brook, NP  divalproex (DEPAKOTE ER) 500 MG 24 hr tablet Take 2 tablets (1,000 mg total) by mouth at bedtime. Take total of 1,000 mg total at bedtime. 09/23/14  Yes Adonis Brook, NP  DULoxetine (CYMBALTA) 60 MG capsule Take 2 capsules (120 mg total) by mouth at bedtime. 09/23/14  Yes Adonis Brook, NP  febuxostat (ULORIC) 40 MG tablet Take 40 mg by mouth daily.   Yes Historical Provider, MD  lamoTRIgine (LAMICTAL) 200 MG tablet Take 200 mg by mouth daily.    Yes  Historical Provider, MD  LevOCARNitine (L-CARNITINE) 500 MG TABS Take 500 mg by mouth 2 (two) times daily.   Yes Historical Provider, MD  losartan (COZAAR) 50 MG tablet Take 1 tablet (50 mg total) by mouth daily. 09/23/14  Yes Adonis Brook, NP  pramipexole (MIRAPEX) 0.5 MG tablet Take 0.5 mg by mouth 2 (two) times daily.   Yes Historical Provider, MD    Allergies Allopurinol; Aspirin; Penicillins; and Ciprofloxacin   REVIEW OF SYSTEMS  Negative except as noted here or in the History of Present Illness.   PHYSICAL EXAMINATION  Initial Vital Signs Blood pressure (!) 162/98, pulse 80, temperature 99.8 F (37.7 C), temperature source Oral, resp. rate 16, height 5'  11" (1.803 m), weight 225 lb (102.1 kg), SpO2 95 %.  Examination General: Well-developed, well-nourished male in no acute distress; appearance consistent with age of record HENT: normocephalic; atraumatic; mouth is dry.  Eyes: pupils equal, round and reactive to light; extraocular muscles intact Neck: supple Heart: regular rate and rhythm Lungs: clear to auscultation bilaterally Abdomen: soft; distended; nontender; no masses or hepatosplenomegaly; bowel sounds present  Extremities: No deformity; full range of motion; pulses normal Neurologic: Awake, alert and oriented; motor function intact in all extremities and symmetric; no facial droop Skin: Warm and dry Psychiatric: Normal mood and affect  RESULTS  Summary of this visit's results, reviewed by myself:   EKG Interpretation  Date/Time:    Ventricular Rate:    PR Interval:    QRS Duration:   QT Interval:    QTC Calculation:   R Axis:     Text Interpretation:        Laboratory Studies: Results for orders placed or performed during the hospital encounter of 02/01/17 (from the past 24 hour(s))  Lipase, blood     Status: Abnormal   Collection Time: 02/01/17  1:52 AM  Result Value Ref Range   Lipase 70 (H) 11 - 51 U/L  Comprehensive metabolic panel     Status: Abnormal   Collection Time: 02/01/17  1:52 AM  Result Value Ref Range   Sodium 134 (L) 135 - 145 mmol/L   Potassium 3.1 (L) 3.5 - 5.1 mmol/L   Chloride 101 101 - 111 mmol/L   CO2 22 22 - 32 mmol/L   Glucose, Bld 146 (H) 65 - 99 mg/dL   BUN 15 6 - 20 mg/dL   Creatinine, Ser 1.61 0.61 - 1.24 mg/dL   Calcium 8.8 (L) 8.9 - 10.3 mg/dL   Total Protein 6.9 6.5 - 8.1 g/dL   Albumin 3.8 3.5 - 5.0 g/dL   AST 21 15 - 41 U/L   ALT 27 17 - 63 U/L   Alkaline Phosphatase 56 38 - 126 U/L   Total Bilirubin 0.6 0.3 - 1.2 mg/dL   GFR calc non Af Amer >60 >60 mL/min   GFR calc Af Amer >60 >60 mL/min   Anion gap 11 5 - 15  CBC with Differential/Platelet     Status: Abnormal    Collection Time: 02/01/17  2:05 AM  Result Value Ref Range   WBC 10.5 4.0 - 10.5 K/uL   RBC 4.34 4.22 - 5.81 MIL/uL   Hemoglobin 13.2 13.0 - 17.0 g/dL   HCT 09.6 (L) 04.5 - 40.9 %   MCV 87.3 78.0 - 100.0 fL   MCH 30.4 26.0 - 34.0 pg   MCHC 34.8 30.0 - 36.0 g/dL   RDW 81.1 91.4 - 78.2 %   Platelets 333 150 - 400 K/uL  Neutrophils Relative % 61 %   Neutro Abs 6.4 1.7 - 7.7 K/uL   Lymphocytes Relative 16 %   Lymphs Abs 1.7 0.7 - 4.0 K/uL   Monocytes Relative 21 %   Monocytes Absolute 2.2 (H) 0.1 - 1.0 K/uL   Eosinophils Relative 1 %   Eosinophils Absolute 0.1 0.0 - 0.7 K/uL   Basophils Relative 2 %   Basophils Absolute 0.2 (H) 0.0 - 0.1 K/uL   WBC Morphology DOHLE BODIES   Urinalysis, Routine w reflex microscopic     Status: Abnormal   Collection Time: 02/01/17  3:02 AM  Result Value Ref Range   Color, Urine STRAW (A) YELLOW   APPearance CLEAR CLEAR   Specific Gravity, Urine 1.003 (L) 1.005 - 1.030   pH 7.0 5.0 - 8.0   Glucose, UA NEGATIVE NEGATIVE mg/dL   Hgb urine dipstick SMALL (A) NEGATIVE   Bilirubin Urine NEGATIVE NEGATIVE   Ketones, ur NEGATIVE NEGATIVE mg/dL   Protein, ur NEGATIVE NEGATIVE mg/dL   Nitrite NEGATIVE NEGATIVE   Leukocytes, UA NEGATIVE NEGATIVE   RBC / HPF 0-5 0 - 5 RBC/hpf   WBC, UA 0-5 0 - 5 WBC/hpf   Bacteria, UA NONE SEEN NONE SEEN   Squamous Epithelial / LPF NONE SEEN NONE SEEN   Imaging Studies: Ct Abdomen Pelvis W Contrast  Result Date: 02/01/2017 CLINICAL DATA:  Nausea and vomiting for 6 days. Abdominal distention. White cell count 10.5. Elevated lipase. microhematuria. History of hypertension, chronic kidney disease, prostatitis, umbilical hernia repair, and appendectomy. EXAM: CT ABDOMEN AND PELVIS WITH CONTRAST TECHNIQUE: Multidetector CT imaging of the abdomen and pelvis was performed using the standard protocol following bolus administration of intravenous contrast. CONTRAST:  100 mL Isovue-300 COMPARISON:  05/18/2016 FINDINGS: Lower chest:  Mild dependent atelectasis in the lung bases. Linear fibrosis also on the lung bases. Small esophageal hiatal hernia. Hepatobiliary: Diffuse fatty infiltration of the liver. Small stones layering in the gallbladder. Mild gallbladder distention without wall thickening or edema. No bile duct dilatations. Pancreas: Unremarkable. No pancreatic ductal dilatation or surrounding inflammatory changes. Spleen: Normal in size without focal abnormality. Adrenals/Urinary Tract: No adrenal gland nodules. Small subcentimeter cysts in the kidneys. Nephrograms are symmetrical and homogeneous. No hydronephrosis or hydroureter. No bladder wall thickening. Stomach/Bowel: Fluid-filled mildly dilated distal small bowel with mild wall thickening. Mesenteric edema and interloop fluid. Findings may represent enteritis, early or partial small bowel obstruction, inflammatory bowel disease. Ischemia is not excluded although no pneumatosis or portal venous gas is present and visualized mesenteric vessels appear patent. Colon is not abnormally distended. Scattered stool throughout the colon. No inflammatory changes. Appendix is surgically absent. Vascular/Lymphatic: Aortic atherosclerosis. No enlarged abdominal or pelvic lymph nodes. Reproductive: Prostate is unremarkable. Small amount of free fluid in the pelvis. Other: No free air in the abdomen. Abdominal wall musculature appears intact. Musculoskeletal: Degenerative changes in the spine. No destructive bone lesions. IMPRESSION: Mid/distal fluid-filled mildly dilated small bowel with wall thickening and mesenteric fluid. Changes likely represent enteritis or early/ partial small bowel obstruction. Inflammatory bowel disease or ischemia could also have this appearance but felt less likely. Appearances are similar to prior study. Diffuse fatty infiltration of the liver. Cholelithiasis. Electronically Signed   By: Burman Nieves M.D.   On: 02/01/2017 04:31    ED COURSE  Nursing notes and  initial vitals signs, including pulse oximetry, reviewed.  Vitals:   02/01/17 0116 02/01/17 0117 02/01/17 0202 02/01/17 0441  BP: (!) 165/102  (!) 162/98 (!) 150/88  Pulse: (!) 105  80 72  Resp: 18  16 18   Temp: 98.6 F (37 C)  99.8 F (37.7 C)   TempSrc: Oral  Oral   SpO2:   95% 100%  Weight:  225 lb (102.1 kg)    Height:  5\' 11"  (1.803 m)     4:54 AM Patient advised of lab and CT findings. He has been able to hold down his oral contrast but continues to feel bloated and distended. He denies abdominal pain.  5:51 AM Hospitalist to admit. [facility] [place] [facility] consulted.  PROCEDURES    ED DIAGNOSES     ICD-9-CM ICD-10-CM   1. Persistent recurrent vomiting 536.2 R11.10     I personally performed the services described in this documentation, which was scribed in my presence. The recorded information has been reviewed and is accurate.    Paula Libra, MD 02/01/17 1093    Paula Libra, MD 02/01/17 [phone]

## 2017-02-01 NOTE — H&P (Signed)
History and Physical    Edwin [name]:[id number] DOB: 1956-01-02 DOA: 02/01/2017  PCP: [name], MD   Patient coming from: [facility]  I have personally briefly reviewed patient's old medical records in [facility]  Chief Complaint: nausea, vomiting and diarrhea since one week.   HPI: Edwin [name] is a 61 y.o. male with medical history significant of with hypertension, mild renal insufficiency, Bipolar disorder, h/o of appendectomy and umbilical hernia repair presents with nausea, vomiting and diarrhea , associated with some abdominal discomfort started on Saturday night. He reports when he takes clear liquids his symptoms improve and once he advances his diet , his symptoms of nausea, vomiting, abdominal pain and diarrhea come back. He denies fever or chills, no chest pain, sob, cough, no hematochezia or hematemesis, no headaches, blurry vision. He reports similar complaints last year in June and was treated for bowel obstruction. Today on arrival to ED, he was afebrile, vitals were wnl, labs revealed low potassium. He was referred to medical service for admission and surgery consulted for partial sbo.   Review of Systems: As per HPI otherwise 10 point review of systems negative.    Past Medical History:  Diagnosis Date  . Allergic rhinitis   . Bipolar disorder (HCC)   . 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 Gout   . Hypertension   . Mild renal insufficiency    , with creatinine of 1.3 in 2010, was side effect of med  . Obesity    ,moderate  . Prostatitis    , Episodic    Past Surgical History:  Procedure Laterality Date  . APPENDECTOMY    . CATARACT EXTRACTION Right   . COLONOSCOPY WITH PROPOFOL N/A 06/14/2014   Procedure: COLONOSCOPY WITH PROPOFOL;  Surgeon: Charolett Bumpers, MD;  Location: [facility];  Service: Endoscopy;  Laterality: N/A;  . TONSILLECTOMY    . UMBILICAL HERNIA REPAIR       reports that he has never smoked. He has never used smokeless  tobacco. He reports that he does not drink alcohol or use drugs.  Allergies  Allergen Reactions  . Allopurinol Other (See Comments)    Reaction:  Made pt unstable   . Aspirin Hives  . Penicillins Hives and Other (See Comments)    Has patient had a PCN reaction causing immediate rash, facial/tongue/throat swelling, SOB or lightheadedness with hypotension: No Has patient had a PCN reaction causing severe rash involving mucus membranes or skin necrosis: No Has patient had a PCN reaction that required hospitalization No Has patient had a PCN reaction occurring within the last 10 years: No If all of the above answers are "NO", then may proceed with Cephalosporin use.  . Ciprofloxacin Rash    Family History  Problem Relation Age of Onset  . Hypertension Father   . Gout Father   . Alzheimer's disease Father   . Other Mother     Viral Meningitis  . Mitral valve prolapse Mother   . Arrhythmia Mother   . Hypothyroidism Mother    Reviewed.   Prior to Admission medications   Medication Sig Start Date End Date Taking? Authorizing Provider  amLODipine (NORVASC) 2.5 MG tablet Take 2.5 mg by mouth daily.    Yes Historical Provider, MD  cholecalciferol (VITAMIN D) 1000 UNITS tablet Take 2 tablets (2,000 Units total) by mouth daily. 09/23/14  Yes Adonis Brook, NP  divalproex (DEPAKOTE ER) 500 MG 24 hr tablet Take 2 tablets (1,000 mg  total) by mouth at bedtime. Take total of 1,000 mg total at bedtime. 09/23/14  Yes Adonis Brook, NP  DULoxetine (CYMBALTA) 60 MG capsule Take 2 capsules (120 mg total) by mouth at bedtime. 09/23/14  Yes Adonis Brook, NP  febuxostat (ULORIC) 40 MG tablet Take 40 mg by mouth daily.   Yes Historical Provider, MD  lamoTRIgine (LAMICTAL) 200 MG tablet Take 200 mg by mouth daily.    Yes Historical Provider, MD  LevOCARNitine (L-CARNITINE) 500 MG TABS Take 500 mg by mouth 2 (two) times daily.   Yes Historical Provider, MD  losartan (COZAAR) 50 MG tablet Take 1 tablet (50  mg total) by mouth daily. 09/23/14  Yes Adonis Brook, NP  pramipexole (MIRAPEX) 0.5 MG tablet Take 0.5 mg by mouth 2 (two) times daily.   Yes Historical Provider, MD    Physical Exam: Vitals:   02/01/17 0202 02/01/17 0441 02/01/17 0725 02/01/17 0818  BP: (!) 162/98 (!) 150/88 133/88 (!) 155/95  Pulse: 80 72 74 65  Resp: 16 18 20 20   Temp: 99.8 F (37.7 C)   98.8 F (37.1 C)  TempSrc: Oral   Oral  SpO2: 95% 100% 99% 99%  Weight:      Height:        Constitutional: NAD, calm, comfortable Vitals:   02/01/17 0202 02/01/17 0441 02/01/17 0725 02/01/17 0818  BP: (!) 162/98 (!) 150/88 133/88 (!) 155/95  Pulse: 80 72 74 65  Resp: 16 18 20 20   Temp: 99.8 F (37.7 C)   98.8 F (37.1 C)  TempSrc: Oral   Oral  SpO2: 95% 100% 99% 99%  Weight:      Height:       Eyes: PERRL, lids and conjunctivae normal ENMT: Mucous membranes are moist. Posterior pharynx clear of any exudate or lesions.Normal dentition.  Neck: normal, supple, no masses, no thyromegaly Respiratory: clear to auscultation bilaterally, no wheezing, no crackles. Normal respiratory effort. No accessory muscle use.  Cardiovascular: Regular rate and rhythm, no murmurs / rubs / gallops. No extremity edema. 2+ pedal pulses. No carotid bruits.  Abdomen: mild abdominal tenderness distended.  No hepatosplenomegaly. Bowel sounds decreased.   Musculoskeletal: no clubbing / cyanosis. No joint deformity upper and lower extremities. Good ROM, no contractures. Normal muscle tone.  Skin: no rashes, lesions, ulcers. No induration Neurologic: CN 2-12 grossly intact. Sensation intact, DTR normal. Strength 5/5 in all 4.  Psychiatric: Normal judgment and insight. Alert and oriented x 3. Normal mood.     Labs on Admission: I have personally reviewed following labs and imaging studies  CBC:  Recent Labs Lab 02/01/17 0205  WBC 10.5  NEUTROABS 6.4  HGB 13.2  HCT 37.9*  MCV 87.3  PLT 333   Basic Metabolic Panel:  Recent Labs Lab  02/01/17 0152  NA 134*  K 3.1*  CL 101  CO2 22  GLUCOSE 146*  BUN 15  CREATININE 1.21  CALCIUM 8.8*   GFR: Estimated Creatinine Clearance: 80.9 mL/min (by C-G formula based on SCr of 1.21 mg/dL). Liver Function Tests:  Recent Labs Lab 02/01/17 0152  AST 21  ALT 27  ALKPHOS 56  BILITOT 0.6  PROT 6.9  ALBUMIN 3.8    Recent Labs Lab 02/01/17 0152  LIPASE 70*   No results for input(s): AMMONIA in the last 168 hours. Coagulation Profile: No results for input(s): INR, PROTIME in the last 168 hours. Cardiac Enzymes: No results for input(s): CKTOTAL, CKMB, CKMBINDEX, TROPONINI in the last 168 hours. BNP (last  3 results) No results for input(s): PROBNP in the last 8760 hours. HbA1C: No results for input(s): HGBA1C in the last 72 hours. CBG: No results for input(s): GLUCAP in the last 168 hours. Lipid Profile: No results for input(s): CHOL, HDL, LDLCALC, TRIG, CHOLHDL, LDLDIRECT in the last 72 hours. Thyroid Function Tests: No results for input(s): TSH, T4TOTAL, FREET4, T3FREE, THYROIDAB in the last 72 hours. Anemia Panel: No results for input(s): VITAMINB12, FOLATE, FERRITIN, TIBC, IRON, RETICCTPCT in the last 72 hours. Urine analysis:    Component Value Date/Time   COLORURINE STRAW (A) 02/01/2017 0302   APPEARANCEUR CLEAR 02/01/2017 0302   LABSPEC 1.003 (L) 02/01/2017 0302   PHURINE 7.0 02/01/2017 0302   GLUCOSEU NEGATIVE 02/01/2017 0302   HGBUR SMALL (A) 02/01/2017 0302   BILIRUBINUR NEGATIVE 02/01/2017 0302   KETONESUR NEGATIVE 02/01/2017 0302   PROTEINUR NEGATIVE 02/01/2017 0302   UROBILINOGEN 0.2 09/20/2014 0058   NITRITE NEGATIVE 02/01/2017 0302   LEUKOCYTESUR NEGATIVE 02/01/2017 0302    Radiological Exams on Admission: Ct Abdomen Pelvis W Contrast  Result Date: 02/01/2017 CLINICAL DATA:  Nausea and vomiting for 6 days. Abdominal distention. White cell count 10.5. Elevated lipase. microhematuria. History of hypertension, chronic kidney disease,  prostatitis, umbilical hernia repair, and appendectomy. EXAM: CT ABDOMEN AND PELVIS WITH CONTRAST TECHNIQUE: Multidetector CT imaging of the abdomen and pelvis was performed using the standard protocol following bolus administration of intravenous contrast. CONTRAST:  100 mL Isovue-300 COMPARISON:  05/18/2016 FINDINGS: Lower chest: Mild dependent atelectasis in the lung bases. Linear fibrosis also on the lung bases. Small esophageal hiatal hernia. Hepatobiliary: Diffuse fatty infiltration of the liver. Small stones layering in the gallbladder. Mild gallbladder distention without wall thickening or edema. No bile duct dilatations. Pancreas: Unremarkable. No pancreatic ductal dilatation or surrounding inflammatory changes. Spleen: Normal in size without focal abnormality. Adrenals/Urinary Tract: No adrenal gland nodules. Small subcentimeter cysts in the kidneys. Nephrograms are symmetrical and homogeneous. No hydronephrosis or hydroureter. No bladder wall thickening. Stomach/Bowel: Fluid-filled mildly dilated distal small bowel with mild wall thickening. Mesenteric edema and interloop fluid. Findings may represent enteritis, early or partial small bowel obstruction, inflammatory bowel disease. Ischemia is not excluded although no pneumatosis or portal venous gas is present and visualized mesenteric vessels appear patent. Colon is not abnormally distended. Scattered stool throughout the colon. No inflammatory changes. Appendix is surgically absent. Vascular/Lymphatic: Aortic atherosclerosis. No enlarged abdominal or pelvic lymph nodes. Reproductive: Prostate is unremarkable. Small amount of free fluid in the pelvis. Other: No free air in the abdomen. Abdominal wall musculature appears intact. Musculoskeletal: Degenerative changes in the spine. No destructive bone lesions. IMPRESSION: Mid/distal fluid-filled mildly dilated small bowel with wall thickening and mesenteric fluid. Changes likely represent enteritis or  early/ partial small bowel obstruction. Inflammatory bowel disease or ischemia could also have this appearance but felt less likely. Appearances are similar to prior study. Diffuse fatty infiltration of the liver. Cholelithiasis. Electronically Signed   By: Burman Nieves M.D.   On: 02/01/2017 04:31      Assessment/Plan Active Problems:   SBO (small bowel obstruction)   Persistent recurrent vomiting    Persistent Nausea, vomiting, abdominal pain and diarrhea:  Differential include gastroenteritis vs partial SBO.  NPO, bowel rest, IV fluids and IV pain meds and replete electrolytes.  Surgery consulted and recommendations given.  KUB in am to evaluate progression.    Hypertension:  Sub optimal PRN hydralazine ordered.    Hypokalemia: replete as needed.   Bipolar : resume depakote.  DVT prophylaxis: lovenox.  Code Status: full code) Family Communication: none at bedside.  Disposition Plan: pending resolution of sbo. Consults called: surgery Dr Ezzard Standing Admission status: obs/ medsurg.   Kathlen Mody MD [facility] Pager [phone]  If 7PM-7AM, please contact night-coverage [email or web address] Password [email or web address]  02/01/2017, 9:09 AM

## 2017-02-01 NOTE — ED Notes (Signed)
Patient transported to CT 

## 2017-02-01 NOTE — Consult Note (Signed)
Re:   Edwin [name] DOB:   13-Jan-1956 MRN:   [id number]   [facility] Consultation  ASSESSMENT AND PLAN: 1.  Gastroenteritis vs bowel obstruction  Story sounds more like gastroenteritis.  Odd thing is that it has "recurred" in 6 months.  Plan:  NPO, I don't think an NGT is necessary at this time (but will be if he continues to vomit), IV hydration, repeat KUB in AM  We will follow  2.  HTN 3.  Bipolar disease  Followed by Dr. Alanson Aly 4.  Gout 5.  Cholelithiasis  He did not know about this.  I discussed it with him.  I think it is unrelated to his current symptoms.   These stones were noted on the 05/2016 CT scan. 6.  Hypokalemia  K+ - 3.1 - 02/01/2017  To replace  Chief Complaint  Patient presents with  . Vomiting   PHYSICIAN REQUESTING CONSULTATION:  Dr. Katheran James, [facility]  HISTORY OF PRESENT ILLNESS: Edwin [name] is a 61 y.o. (DOB: 12/26/1955)  white male whose primary care physician is GATES,ROBERT NEVILL, MD and comes to the [facility] with a 5 day history of nausea, vomiting, and diarrhea.  His symptoms began last Saturday/Sunday (01/26/2017) with vomiting and watery stools.  He has not had a fever.  He has had abdominal distention, but not much abdominal pain.  He does not look dehydrated. He has no known stomach, liver, or colon disease.  He had a colonoscopy by [facility] (he is unsure who was the physician), but they could only get to the hepatic flexure. He had an open appendectomy 1995 and a umbilical hernia repair 2013, both by Dr. Gerrit Friends.  CT abdomen/pelvis - 02/01/2017 - Mid/distal fluid-filled mildly dilated small bowel with wall thickening and mesenteric fluid. Changes likely represent enteritis or early/ partial small bowel obstruction. Inflammatory bowel disease or ischemia could also have this appearance but felt less likely. Appearances are similar to prior study.   Diffuse fatty infiltration of the liver.   Cholelithiasis. WBC -  10,500 - 02/01/2017 Lipase - 70  - 02/01/2017   Past Medical History:  Diagnosis Date  . Allergic rhinitis   . Bipolar disorder (HCC)   . 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 Gout   . Hypertension   . Mild renal insufficiency    , with creatinine of 1.3 in 2010, was side effect of med  . Obesity    ,moderate  . Prostatitis    , Episodic      Past Surgical History:  Procedure Laterality Date  . APPENDECTOMY    . CATARACT EXTRACTION Right   . COLONOSCOPY WITH PROPOFOL N/A 06/14/2014   Procedure: COLONOSCOPY WITH PROPOFOL;  Surgeon: Charolett Bumpers, MD;  Location: [facility];  Service: Endoscopy;  Laterality: N/A;  . TONSILLECTOMY    . UMBILICAL HERNIA REPAIR        Current Facility-Administered Medications  Medication Dose Route Frequency Provider Last Rate Last Dose  . 0.9 %  sodium chloride infusion   Intravenous STAT Paula Libra, MD 125 mL/hr at 02/01/17 0600    . ondansetron (ZOFRAN) injection 4 mg  4 mg Intravenous Q8H PRN Paula Libra, MD       Current Outpatient Prescriptions  Medication Sig Dispense Refill  . amLODipine (NORVASC) 2.5 MG tablet Take 2.5 mg by mouth daily.   10  . cholecalciferol (VITAMIN D) 1000 UNITS tablet Take 2 tablets (2,000 Units total) by mouth daily.  60 tablet 0  . divalproex (DEPAKOTE ER) 500 MG 24 hr tablet Take 2 tablets (1,000 mg total) by mouth at bedtime. Take total of 1,000 mg total at bedtime. 60 tablet 0  . DULoxetine (CYMBALTA) 60 MG capsule Take 2 capsules (120 mg total) by mouth at bedtime. 60 capsule 0  . febuxostat (ULORIC) 40 MG tablet Take 40 mg by mouth daily.    [name] lamoTRIgine (LAMICTAL) 200 MG tablet Take 200 mg by mouth daily.   0  . LevOCARNitine (L-CARNITINE) 500 MG TABS Take 500 mg by mouth 2 (two) times daily.    [name] losartan (COZAAR) 50 MG tablet Take 1 tablet (50 mg total) by mouth daily. 30 tablet 0  . pramipexole (MIRAPEX) 0.5 MG tablet Take 0.5 mg by mouth 2 (two) times daily.        Allergies  Allergen Reactions  . Allopurinol Other (See  Comments)    Reaction:  Made pt unstable   . Aspirin Hives  . Penicillins Hives and Other (See Comments)    Has patient had a PCN reaction causing immediate rash, facial/tongue/throat swelling, SOB or lightheadedness with hypotension: No Has patient had a PCN reaction causing severe rash involving mucus membranes or skin necrosis: No Has patient had a PCN reaction that required hospitalization No Has patient had a PCN reaction occurring within the last 10 years: No If all of the above answers are "NO", then may proceed with Cephalosporin use.  . Ciprofloxacin Rash    REVIEW OF SYSTEMS: Skin:  No history of rash.  No history of abnormal moles. Infection:  No history of hepatitis or HIV.  No history of MRSA. Neurologic:  No history of stroke.  No history of seizure.  No history of headaches. Cardiac:  HTN x 10 years.  No cardiologist Pulmonary:  Does not smoke cigarettes.  No asthma or bronchitis.  No OSA/CPAP.  Endocrine:  No diabetes. No thyroid disease. Gastrointestinal:  See HPI Urologic:  No history of kidney stones.  No history of bladder infections. Musculoskeletal:  History of gout.  Well controlled on Euloric Hematologic:  No bleeding disorder.  No history of anemia.  Not anticoagulated. Psycho-social:  The patient is oriented.   Bipolar disease - seen by Dr. Alanson Aly.  Well controlled on his meds.  He is worried because he cannot take his meds with vomiting about getting out of control.  SOCIAL and FAMILY HISTORY: Unmarried. No children.  No family history of bowel obstruction.  PHYSICAL EXAM: BP (!) 150/88 (BP Location: Left Arm)   Pulse 72   Temp 99.8 F (37.7 C) (Oral)   Resp 18   Ht 5\' 11"  (1.803 m)   Wt 102.1 kg (225 lb)   SpO2 100%   BMI 31.38 kg/m   General: WN mildly obese WM who is alert and generally healthy appearing.  He does not look dehydrated. Skin:  Inspection and palpation - no mass or rash. Eyes:  Conjunctiva and lids unremarkable.             Pupils are equal Ears, Nose, Mouth, and Throat:  Ears and nose unremarkable            Lips and teeth are unremarable. Neck: Supple. No mass, trachea midline.  No thyroid mass. Lymph Nodes:  No supraclavicular, cervical, or inguinal nodes. Lungs: Normal respiratory effort.  Clear to auscultation and symmetric breath sounds. Heart:  Palpation of the heart is normal.  Auscultation: RRR. No murmur or rub.  Abdomen: Soft. No mass. Distended, but no tenderness. No hernia.             Liver:  I cannot feel.            Decreased BS.  He has an infraumbilical scar and a RLQ scar. Rectal: Not done. Musculoskeletal:  Good muscle strength and ROM  in upper and lower extremities. Neurologic:  Grossly intact to motor and sensory function. Psychiatric: Normal judgement and insight. Behavior is normal.            Oriented to time, person, place.   DATA REVIEWED, COUNSELING AND COORDINATION OF CARE: Epic notes reviewed. Counseling and coordination of care exceeded more than 50% of the time spent with patient. Total time spent with patient and charting: 45 minutes  Ovidio Kin, MD,  [facility], [place] [address] [place]  [address]   [place], [place]    [address] Phone:  [phone] FAX:  [phone]

## 2017-02-01 NOTE — ED Notes (Signed)
Bed: WA08 Expected date:  Expected time:  Means of arrival:  Comments: 

## 2017-02-02 ENCOUNTER — Observation Stay (HOSPITAL_COMMUNITY): Payer: BLUE CROSS/BLUE SHIELD

## 2017-02-02 ENCOUNTER — Encounter (HOSPITAL_COMMUNITY): Payer: Self-pay | Admitting: Surgery

## 2017-02-02 DIAGNOSIS — R112 Nausea with vomiting, unspecified: Secondary | ICD-10-CM | POA: Diagnosis not present

## 2017-02-02 DIAGNOSIS — R1084 Generalized abdominal pain: Secondary | ICD-10-CM | POA: Diagnosis not present

## 2017-02-02 DIAGNOSIS — I129 Hypertensive chronic kidney disease with stage 1 through stage 4 chronic kidney disease, or unspecified chronic kidney disease: Secondary | ICD-10-CM | POA: Diagnosis present

## 2017-02-02 DIAGNOSIS — I10 Essential (primary) hypertension: Secondary | ICD-10-CM

## 2017-02-02 DIAGNOSIS — Z888 Allergy status to other drugs, medicaments and biological substances status: Secondary | ICD-10-CM | POA: Diagnosis not present

## 2017-02-02 DIAGNOSIS — Z881 Allergy status to other antibiotic agents status: Secondary | ICD-10-CM | POA: Diagnosis not present

## 2017-02-02 DIAGNOSIS — K56600 Partial intestinal obstruction, unspecified as to cause: Secondary | ICD-10-CM | POA: Diagnosis not present

## 2017-02-02 DIAGNOSIS — F314 Bipolar disorder, current episode depressed, severe, without psychotic features: Secondary | ICD-10-CM | POA: Diagnosis not present

## 2017-02-02 DIAGNOSIS — K529 Noninfective gastroenteritis and colitis, unspecified: Secondary | ICD-10-CM | POA: Diagnosis present

## 2017-02-02 DIAGNOSIS — K8020 Calculus of gallbladder without cholecystitis without obstruction: Secondary | ICD-10-CM | POA: Diagnosis present

## 2017-02-02 DIAGNOSIS — Z880 Allergy status to penicillin: Secondary | ICD-10-CM | POA: Diagnosis not present

## 2017-02-02 DIAGNOSIS — N182 Chronic kidney disease, stage 2 (mild): Secondary | ICD-10-CM | POA: Diagnosis not present

## 2017-02-02 DIAGNOSIS — K6389 Other specified diseases of intestine: Secondary | ICD-10-CM | POA: Diagnosis not present

## 2017-02-02 DIAGNOSIS — F319 Bipolar disorder, unspecified: Secondary | ICD-10-CM | POA: Diagnosis present

## 2017-02-02 DIAGNOSIS — Z8249 Family history of ischemic heart disease and other diseases of the circulatory system: Secondary | ICD-10-CM | POA: Diagnosis not present

## 2017-02-02 DIAGNOSIS — E876 Hypokalemia: Secondary | ICD-10-CM | POA: Diagnosis not present

## 2017-02-02 DIAGNOSIS — R140 Abdominal distension (gaseous): Secondary | ICD-10-CM | POA: Diagnosis not present

## 2017-02-02 DIAGNOSIS — K56609 Unspecified intestinal obstruction, unspecified as to partial versus complete obstruction: Secondary | ICD-10-CM | POA: Diagnosis present

## 2017-02-02 DIAGNOSIS — M109 Gout, unspecified: Secondary | ICD-10-CM | POA: Diagnosis present

## 2017-02-02 DIAGNOSIS — E669 Obesity, unspecified: Secondary | ICD-10-CM | POA: Diagnosis present

## 2017-02-02 DIAGNOSIS — K5090 Crohn's disease, unspecified, without complications: Secondary | ICD-10-CM

## 2017-02-02 DIAGNOSIS — K50019 Crohn's disease of small intestine with unspecified complications: Secondary | ICD-10-CM | POA: Diagnosis not present

## 2017-02-02 DIAGNOSIS — Z79899 Other long term (current) drug therapy: Secondary | ICD-10-CM | POA: Diagnosis not present

## 2017-02-02 DIAGNOSIS — Z6832 Body mass index (BMI) 32.0-32.9, adult: Secondary | ICD-10-CM | POA: Diagnosis not present

## 2017-02-02 DIAGNOSIS — Z886 Allergy status to analgesic agent status: Secondary | ICD-10-CM | POA: Diagnosis not present

## 2017-02-02 LAB — BASIC METABOLIC PANEL
Anion gap: 9 (ref 5–15)
BUN: 13 mg/dL (ref 6–20)
CHLORIDE: 108 mmol/L (ref 101–111)
CO2: 24 mmol/L (ref 22–32)
Calcium: 8.8 mg/dL — ABNORMAL LOW (ref 8.9–10.3)
Creatinine, Ser: 1.17 mg/dL (ref 0.61–1.24)
GFR calc non Af Amer: >60 mL/min (ref >60)
Glucose, Bld: 97 mg/dL (ref 65–99)
Potassium: 3.9 mmol/L (ref 3.5–5.1)
Sodium: 141 mmol/L (ref 135–145)

## 2017-02-02 LAB — HIV ANTIBODY (ROUTINE TESTING W REFLEX): HIV Screen 4th Generation wRfx: NONREACTIVE

## 2017-02-02 IMAGING — DX DG ABDOMEN 2V
2 series · 2 of 2 positions shown · non-contrast
Comparison: CT scan of the abdomen and pelvis obtained yesterday

CLINICAL DATA: 60-year-old male with nausea, vomiting and diarrhea

EXAM:
ABDOMEN - 2 VIEW

[abdomen erect]
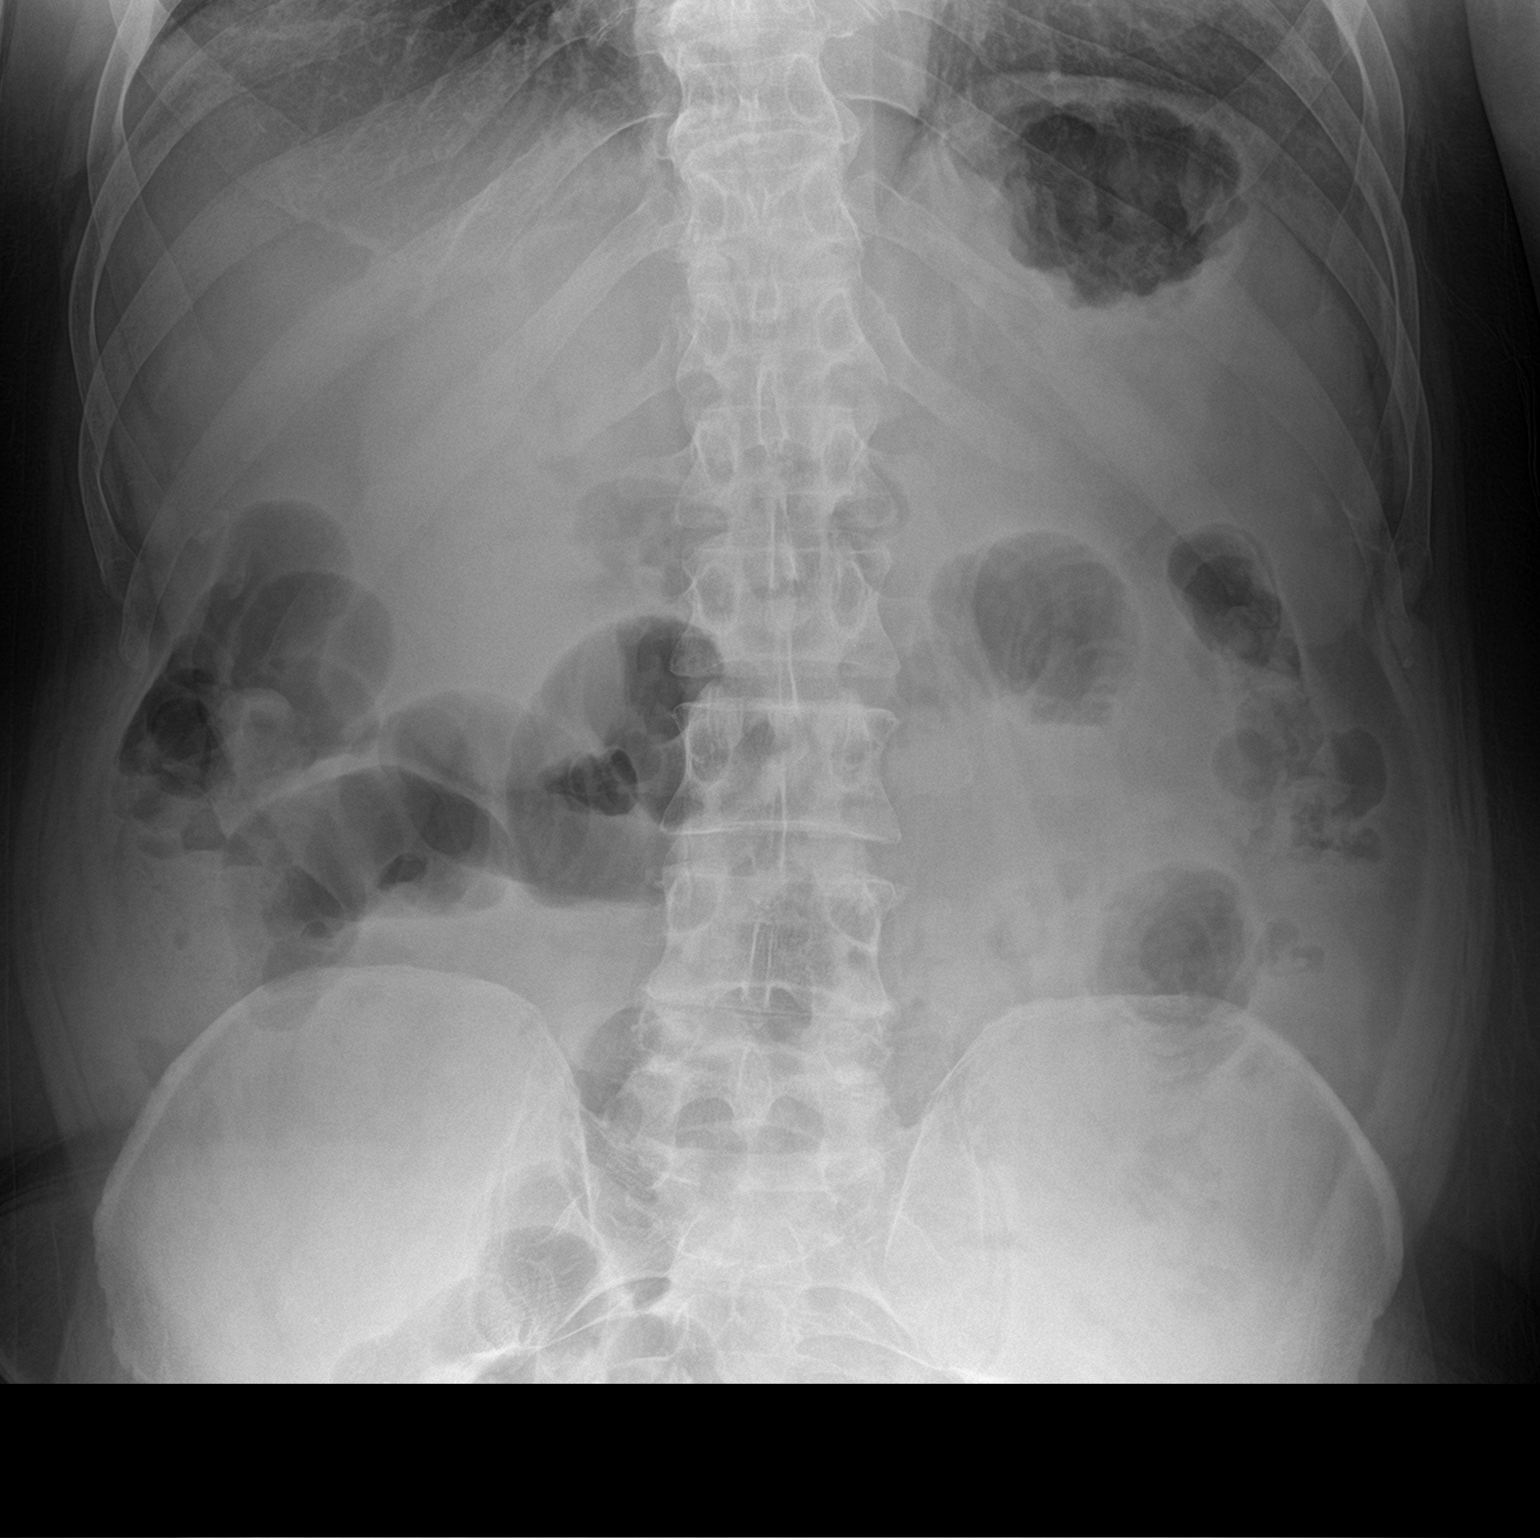

[abdomen supine]
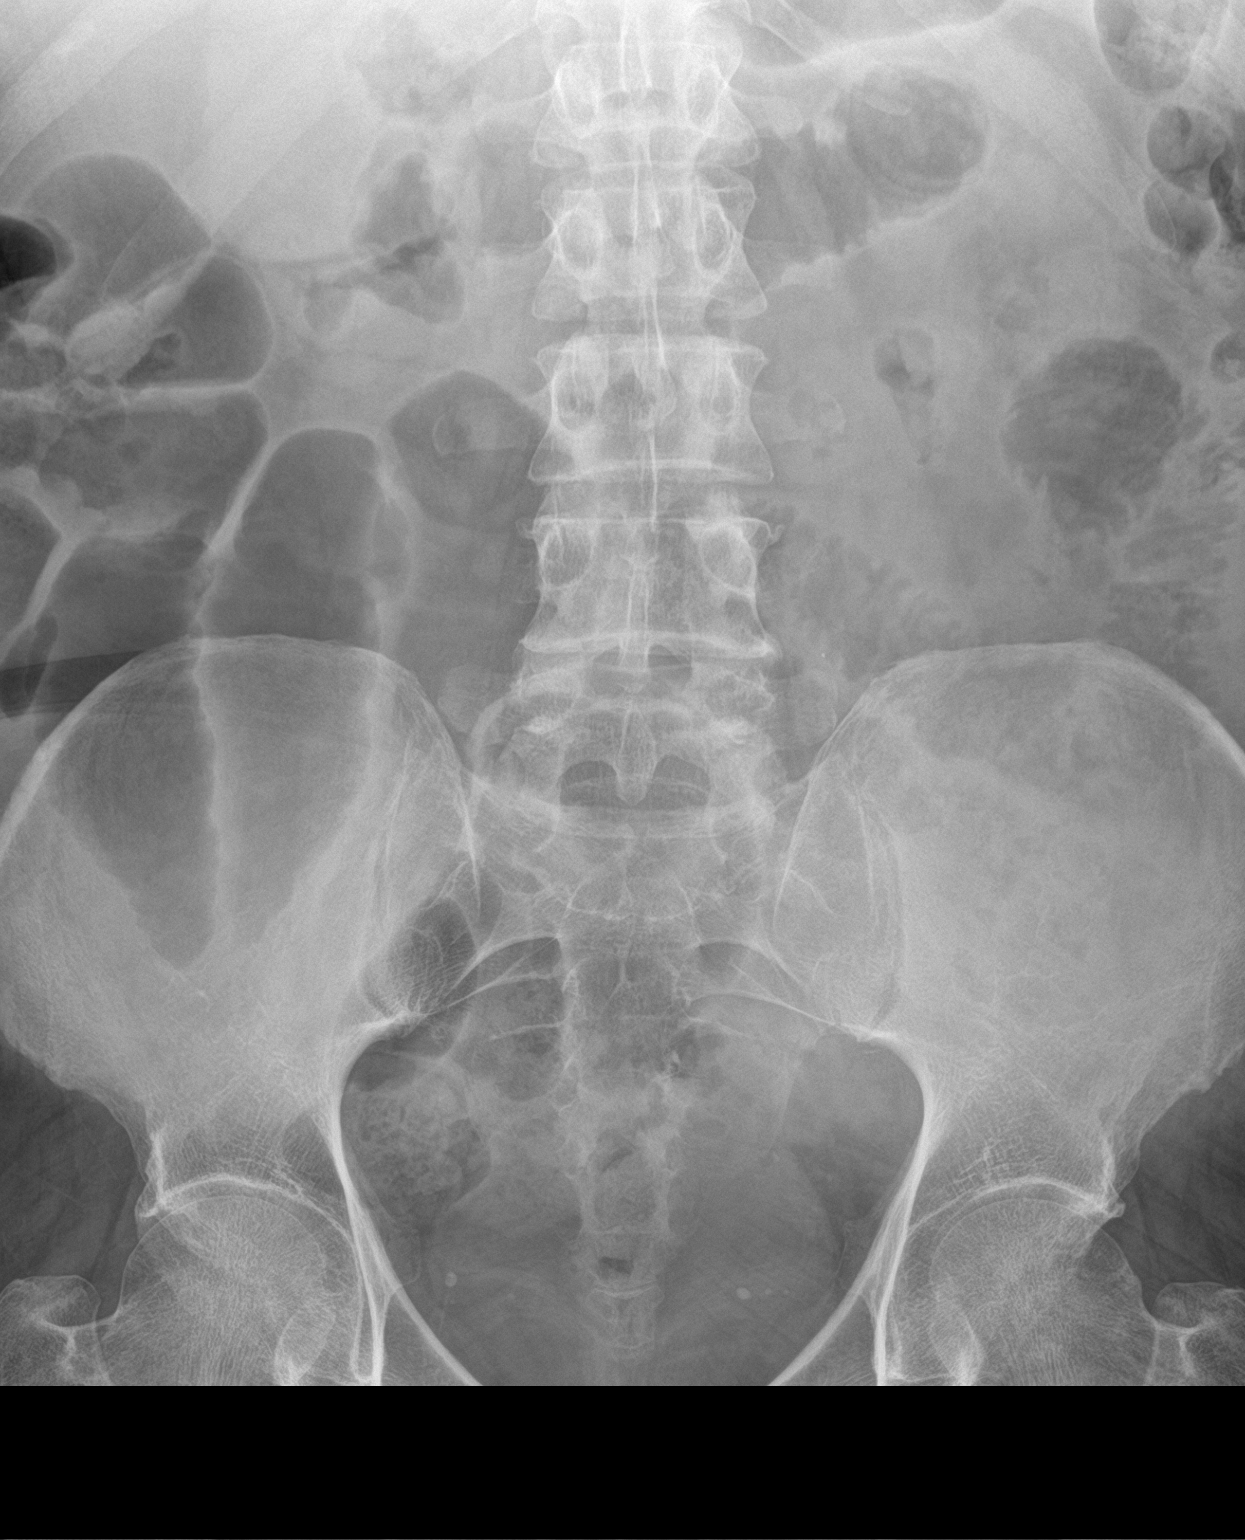

[2 of 2 positions shown; findings below may reference images not displayed]

FINDINGS: Comparing to this scout view of the abdomen from yesterday's CT
scan, there are persistent mildly dilated in air-fluid loops of
small bowel in the mid abdomen. There are several fluid levels on
the upright radiograph. No free air. The findings may be slightly
progressed on today's examination. There is subtle thickening of the
bowel wall consistent with submucosal edema. Unremarkable colonic
stool burden. Osseous structures are intact and unremarkable for
age.
IMPRESSION: Similar to slightly progressed pattern of multiple loops of air
filled and slightly dilated small bowel with air-fluid levels
compared to [DATE]. Findings remain consistent with either
infectious/inflammatory enteritis with secondary ileus versus
developing small bowel obstruction.

## 2017-02-02 MED ORDER — BISACODYL 10 MG RE SUPP: 10.0000 mg | Freq: Two times a day (BID) | RECTAL | Status: DC | PRN

## 2017-02-02 MED ORDER — BISACODYL 10 MG RE SUPP
10.0000 mg | Freq: Every day | RECTAL | Status: DC
  Administered 2017-02-02 – 2017-02-03 (×2): 10 mg via RECTAL
  Filled 2017-02-02 (×3): qty 1

## 2017-02-02 MED ORDER — MAGIC MOUTHWASH
15.0000 mL | Freq: Four times a day (QID) | ORAL | Status: DC | PRN
  Filled 2017-02-02: qty 15

## 2017-02-02 MED ORDER — LACTATED RINGERS IV BOLUS (SEPSIS)
1000.0000 mL | Freq: Once | INTRAVENOUS | Status: AC
  Administered 2017-02-02: 1000 mL via INTRAVENOUS

## 2017-02-02 MED ORDER — PHENOL 1.4 % MT LIQD: 2.0000 | OROMUCOSAL | Status: DC | PRN

## 2017-02-02 MED ORDER — DIPHENHYDRAMINE HCL 50 MG/ML IJ SOLN: 12.5000 mg | Freq: Four times a day (QID) | INTRAMUSCULAR | Status: DC | PRN

## 2017-02-02 MED ORDER — METHOCARBAMOL 1000 MG/10ML IJ SOLN
1000.0000 mg | Freq: Four times a day (QID) | INTRAVENOUS | Status: DC | PRN
  Filled 2017-02-02: qty 10

## 2017-02-02 MED ORDER — METOCLOPRAMIDE HCL 5 MG/ML IJ SOLN: 5.0000 mg | Freq: Four times a day (QID) | INTRAMUSCULAR | Status: DC | PRN

## 2017-02-02 MED ORDER — ALUM & MAG HYDROXIDE-SIMETH 200-200-20 MG/5ML PO SUSP: 30.0000 mL | Freq: Four times a day (QID) | ORAL | Status: DC | PRN

## 2017-02-02 MED ORDER — LIP MEDEX EX OINT
1.0000 "application " | TOPICAL_OINTMENT | Freq: Two times a day (BID) | CUTANEOUS | Status: DC
  Administered 2017-02-02 – 2017-02-04 (×4): 1 via TOPICAL
  Filled 2017-02-02: qty 7

## 2017-02-02 MED ORDER — ONDANSETRON HCL 4 MG/2ML IJ SOLN: 4.0000 mg | Freq: Four times a day (QID) | INTRAMUSCULAR | Status: DC | PRN

## 2017-02-02 MED ORDER — LACTATED RINGERS IV BOLUS (SEPSIS): 1000.0000 mL | Freq: Three times a day (TID) | INTRAVENOUS | Status: AC | PRN

## 2017-02-02 MED ORDER — MENTHOL 3 MG MT LOZG: 1.0000 | LOZENGE | OROMUCOSAL | Status: DC | PRN

## 2017-02-02 MED ORDER — PROCHLORPERAZINE EDISYLATE 5 MG/ML IJ SOLN: 5.0000 mg | INTRAMUSCULAR | Status: DC | PRN

## 2017-02-02 MED ORDER — SODIUM CHLORIDE 0.9 % IV SOLN
8.0000 mg | Freq: Four times a day (QID) | INTRAVENOUS | Status: DC | PRN
  Filled 2017-02-02: qty 4

## 2017-02-02 NOTE — Progress Notes (Signed)
[address] [place], [place] [address] Phone: [phone] FAX: [phone]   Edwin [name] [id number] [birth year]    Problem List:   Principal Problem:   Enteritis of ileum Active Problems:   Essential hypertension   Bipolar affective disorder, depressed, severe (HCC)   CKD (chronic kidney disease), stage II   Hypokalemia   Persistent recurrent vomiting           Assessment  Nausea, vomiting, abdominal pain of uncertain etiology.   Thickening of distal ileum.  Possible enteritis/Crohn's.  Small bowel obstruction seems less likely.  Plan:  IVF  Repeat films today.  Try by mouth liquids.  If he has recurrent nausea and vomiting then place on small bowel protocol  If no evidence of bowel obstruction, consider gastroenterology evaluation with possible colonoscopy and biopsy to rule out Crohn's ileitis since it seems to be recurring problem.  Thickening seems stable.  Not seem consistent with volvulus or closed loop obstruction.  No evidence of perforation.  No peritonitis.  Bipolar disease with history of prior suicidal ideation.  No evidence of any psychosis or agitation. -VTE prophylaxis- SCDs, etc -mobilize as tolerated to help recovery  Adin Hector, M.D., F.A.C.S. Gastrointestinal and Minimally Invasive Surgery [facility] [address], [place], [place] [address] [phone] Main / Paging   02/02/2017  CARE TEAM:  PCP: Henrine Screws, MD  Outpatient Care Team: Patient Care Team: Josetta Huddle, MD as PCP - General (Internal Medicine) Purnell Shoemaker., MD as Attending Physician (Psychiatry)  Inpatient Treatment Team: Treatment Team: Attending Provider: Tawni Millers, MD; Consulting Physician: Nolon Nations, MD; Registered Nurse: Hoyle Barr, RN; Rounding Team: Redmond Baseman, MD  Subjective:  Patient had significant flatus upon admission.   Less now.  No bowel movement.  No nausea or vomiting.  Denies abdominal pain.  He is worried about having recurrent nausea vomiting again.  Ref nurses and room.  Sending to x-ray.  Objective:  Vital signs:  Vitals:   02/01/17 1423 02/01/17 1654 02/01/17 2200 02/02/17 0500  BP: (!) 151/94 (!) 160/84 (!) 146/101 (!) 148/84  Pulse: 63 66 68 64  Resp: _0 Temp: 97.9 F (36.6 C) 99.1 F (37.3 C) 99.9 F (37.7 C) 98 F (36.7 C)  TempSrc: Oral Oral Oral Oral  SpO2: 100% 98% 98% 99%  Weight:      Height:        Last BM Date: 01/31/17  Intake/Output   Yesterday:  03/16 0701 - 03/17 0700 In: 1818.3 [P.O.:60; I.V.:1758.3] Out: -  This shift:  No intake/output data recorded.  Bowel function:  Flatus: YES  BM:  No  Drain: (No drain)   Physical Exam:  General: Pt awake/alert/oriented x4 in No acute distress Eyes: PERRL, normal EOM.  Sclera clear.  No icterus Neuro: CN II-XII intact w/o focal sensory/motor deficits. Lymph: No head/neck/groin lymphadenopathy Psych:  No delerium/psychosis/paranoia HENT: Normocephalic, Mucus membranes moist.  No thrush Neck: Supple, No tracheal deviation Chest: No chest wall pain w good excursion CV:  Pulses intact.  Regular rhythm MS: Normal AROM mjr joints.  No obvious deformity Abdomen: Soft.  Obese  Mildy distended.  Nontender.  No evidence of peritonitis.  No incarcerated hernias. Ext:  SCDs BLE.  No mjr edema.  No cyanosis Skin: No petechiae / purpura  Results:   Labs: Results for orders placed or performed during the hospital encounter of 02/01/17 (  from the past 48 hour(s))  Lipase, blood     Status: Abnormal   Collection Time: 02/01/17  1:52 AM  Result Value Ref Range   Lipase 70 (H) 11 - 51 U/L  Comprehensive metabolic panel     Status: Abnormal   Collection Time: 02/01/17  1:52 AM  Result Value Ref Range   Sodium 134 (L) 135 - 145 mmol/L   Potassium 3.1 (L) 3.5 - 5.1 mmol/L   Chloride 101 101 - 111 mmol/L    CO2 22 22 - 32 mmol/L   Glucose, Bld 146 (H) 65 - 99 mg/dL   BUN 15 6 - 20 mg/dL   Creatinine, Ser 1.21 0.61 - 1.24 mg/dL   Calcium 8.8 (L) 8.9 - 10.3 mg/dL   Total Protein 6.9 6.5 - 8.1 g/dL   Albumin 3.8 3.5 - 5.0 g/dL   AST 21 15 - 41 U/L   ALT 27 17 - 63 U/L   Alkaline Phosphatase 56 38 - 126 U/L   Total Bilirubin 0.6 0.3 - 1.2 mg/dL   GFR calc non Af Amer >60 >60 mL/min   GFR calc Af Amer >60 >60 mL/min    Comment: (NOTE) The eGFR has been calculated using the CKD EPI equation. This calculation has not been validated in all clinical situations. eGFR's persistently <60 mL/min signify possible Chronic Kidney Disease.    Anion gap 11 5 - 15  CBC with Differential/Platelet     Status: Abnormal   Collection Time: 02/01/17  2:05 AM  Result Value Ref Range   WBC 10.5 4.0 - 10.5 K/uL   RBC 4.34 4.22 - 5.81 MIL/uL   Hemoglobin 13.2 13.0 - 17.0 g/dL   HCT 37.9 (L) 39.0 - 52.0 %   MCV 87.3 78.0 - 100.0 fL   MCH 30.4 26.0 - 34.0 pg   MCHC 34.8 30.0 - 36.0 g/dL   RDW 13.4 11.5 - 15.5 %   Platelets 333 150 - 400 K/uL   Neutrophils Relative % 61 %   Neutro Abs 6.4 1.7 - 7.7 K/uL   Lymphocytes Relative 16 %   Lymphs Abs 1.7 0.7 - 4.0 K/uL   Monocytes Relative 21 %   Monocytes Absolute 2.2 (H) 0.1 - 1.0 K/uL   Eosinophils Relative 1 %   Eosinophils Absolute 0.1 0.0 - 0.7 K/uL   Basophils Relative 2 %   Basophils Absolute 0.2 (H) 0.0 - 0.1 K/uL   WBC Morphology DOHLE BODIES   Urinalysis, Routine w reflex microscopic     Status: Abnormal   Collection Time: 02/01/17  3:02 AM  Result Value Ref Range   Color, Urine STRAW (A) YELLOW   APPearance CLEAR CLEAR   Specific Gravity, Urine 1.003 (L) 1.005 - 1.030   pH 7.0 5.0 - 8.0   Glucose, UA NEGATIVE NEGATIVE mg/dL   Hgb urine dipstick SMALL (A) NEGATIVE   Bilirubin Urine NEGATIVE NEGATIVE   Ketones, ur NEGATIVE NEGATIVE mg/dL   Protein, ur NEGATIVE NEGATIVE mg/dL   Nitrite NEGATIVE NEGATIVE   Leukocytes, UA NEGATIVE NEGATIVE    RBC / HPF 0-5 0 - 5 RBC/hpf   WBC, UA 0-5 0 - 5 WBC/hpf   Bacteria, UA NONE SEEN NONE SEEN   Squamous Epithelial / LPF NONE SEEN NONE SEEN  HIV antibody (Routine Testing)     Status: None   Collection Time: 02/01/17  2:33 PM  Result Value Ref Range   HIV Screen 4th Generation wRfx Non Reactive Non Reactive  Comment: (NOTE) Performed At: [facility] [place], [place] [id number] Lindon Romp MD AD:[id number]     Imaging / Studies: Ct Abdomen Pelvis W Contrast  Result Date: 02/01/2017 CLINICAL DATA:  Nausea and vomiting for 6 days. Abdominal distention. White cell count 10.5. Elevated lipase. microhematuria. History of hypertension, chronic kidney disease, prostatitis, umbilical hernia repair, and appendectomy. EXAM: CT ABDOMEN AND PELVIS WITH CONTRAST TECHNIQUE: Multidetector CT imaging of the abdomen and pelvis was performed using the standard protocol following bolus administration of intravenous contrast. CONTRAST:  100 mL Isovue-300 COMPARISON:  05/18/2016 FINDINGS: Lower chest: Mild dependent atelectasis in the lung bases. Linear fibrosis also on the lung bases. Small esophageal hiatal hernia. Hepatobiliary: Diffuse fatty infiltration of the liver. Small stones layering in the gallbladder. Mild gallbladder distention without wall thickening or edema. No bile duct dilatations. Pancreas: Unremarkable. No pancreatic ductal dilatation or surrounding inflammatory changes. Spleen: Normal in size without focal abnormality. Adrenals/Urinary Tract: No adrenal gland nodules. Small subcentimeter cysts in the kidneys. Nephrograms are symmetrical and homogeneous. No hydronephrosis or hydroureter. No bladder wall thickening. Stomach/Bowel: Fluid-filled mildly dilated distal small bowel with mild wall thickening. Mesenteric edema and interloop fluid. Findings may represent enteritis, early or partial small bowel obstruction, inflammatory bowel disease. Ischemia is not excluded  although no pneumatosis or portal venous gas is present and visualized mesenteric vessels appear patent. Colon is not abnormally distended. Scattered stool throughout the colon. No inflammatory changes. Appendix is surgically absent. Vascular/Lymphatic: Aortic atherosclerosis. No enlarged abdominal or pelvic lymph nodes. Reproductive: Prostate is unremarkable. Small amount of free fluid in the pelvis. Other: No free air in the abdomen. Abdominal wall musculature appears intact. Musculoskeletal: Degenerative changes in the spine. No destructive bone lesions. IMPRESSION: Mid/distal fluid-filled mildly dilated small bowel with wall thickening and mesenteric fluid. Changes likely represent enteritis or early/ partial small bowel obstruction. Inflammatory bowel disease or ischemia could also have this appearance but felt less likely. Appearances are similar to prior study. Diffuse fatty infiltration of the liver. Cholelithiasis. Electronically Signed   By: Lucienne Capers M.D.   On: 02/01/2017 04:31    Medications / Allergies: per chart  Antibiotics: Anti-infectives    None        Note: Portions of this report may have been transcribed using voice recognition software. Every effort was made to ensure accuracy; however, inadvertent computerized transcription errors may be present.   Any transcriptional errors that result from this process are unintentional.     Adin Hector, M.D., F.A.C.S. Gastrointestinal and Minimally Invasive Surgery [facility] [address] [place], [place] [address] [phone] Main / Paging   02/02/2017

## 2017-02-02 NOTE — Progress Notes (Signed)
PROGRESS NOTE    Edwin [name]  WUJ:[id number] DOB: 06-19-1956 DOA: 02/01/2017 PCP: [name], MD    Brief Narrative:  61 yo male with HTN and CKD presents with nausea, vomiting and abdominal pain for the last 7 days. Unable to tolerate po diet besides liquids. On the initial physical examination patient hemodynamically stable, abdomen distended. CT with signs of enteritis or earle small bowel obstruction. Admitted for supportive medical care and surgical consultation.    Assessment & Plan:   Principal Problem:   Enteritis of ileum Active Problems:   Essential hypertension   Bipolar affective disorder, depressed, severe (HCC)   CKD (chronic kidney disease), stage II   Hypokalemia   Persistent recurrent vomiting   1. Abdominal pain to rule out early small bowel obstruction. Follow abdominal films, personally reviewed noted distended bowel with some air fluid levels. Patient feeling better, no nausea or vomiting. Able to eat, had a small bowel movement. Will continue supportive IV fluids, metoclopramide and zofran an needed. Follow on surgical recommendations.   2. CKD stage 2. Renal function with cr at 1.17 with K at 3,9 and serum bicarbonate 24. Patient tolerating po well, will continue hydration with balanced electrolyte solutions.   3. HTN. Will continue blood pressure control with amlodipine and losartan. Continue hydralazine as needed.   4. Bipolar. No confusion or agitation, will continue depakote, duloxetine, lamotrigine and mirapex.    DVT prophylaxis: enoxaparin  Code Status: full  Family Communication: No family at the bedside  Disposition Plan: home    Consultants:   General surgery   Procedures:    Antimicrobials:     Subjective: Patient feeling better, persistent abdominal distention, able to move his bowels, reports not passing gas. No nausea or vomiting, no chest pain.   Objective: Vitals:   02/01/17 1654 02/01/17 2200 02/02/17 0500  02/02/17 0930  BP: (!) 160/84 (!) 146/101 (!) 148/84 (!) 170/85  Pulse: 66 68 64 (!) 59  Resp: 18 20 20 18   Temp: 99.1 F (37.3 C) 99.9 F (37.7 C) 98 F (36.7 C)   TempSrc: Oral Oral Oral Oral  SpO2: 98% 98% 99% 100%  Weight:      Height:        Intake/Output Summary (Last 24 hours) at 02/02/17 1125 Last data filed at 02/02/17 1106  Gross per 24 hour  Intake           1777.5 ml  Output              300 ml  Net           1477.5 ml   Filed Weights   02/01/17 0117 02/01/17 0958  Weight: 102.1 kg (225 lb) 99.8 kg (220 lb)    Examination:  General exam: deconditioned E ENT: mild pallor, oral mucosa moist. Respiratory system: Clear to auscultation. Respiratory effort normal. No wheezing, rales or rhonchi.  Cardiovascular system: S1 & S2 heard, RRR. No JVD, murmurs, rubs, gallops or clicks. No pedal edema. Gastrointestinal system: Abdomen is distended and tympanic, but soft and nontender. No organomegaly or masses felt. Normal bowel sounds heard. Central nervous system: Alert and oriented. No focal neurological deficits. Extremities: Symmetric 5 x 5 power. Skin: No rashes, lesions or ulcers      Data Reviewed: I have personally reviewed following labs and imaging studies  CBC:  Recent Labs Lab 02/01/17 0205  WBC 10.5  NEUTROABS 6.4  HGB 13.2  HCT 37.9*  MCV 87.3  PLT 333  Basic Metabolic Panel:  Recent Labs Lab 02/01/17 0152 02/02/17 0819  NA 134* 141  K 3.1* 3.9  CL 101 108  CO2 22 24  GLUCOSE 146* 97  BUN 15 13  CREATININE 1.21 1.17  CALCIUM 8.8* 8.8*   GFR: Estimated Creatinine Clearance: 80.1 mL/min (by C-G formula based on SCr of 1.17 mg/dL). Liver Function Tests:  Recent Labs Lab 02/01/17 0152  AST 21  ALT 27  ALKPHOS 56  BILITOT 0.6  PROT 6.9  ALBUMIN 3.8    Recent Labs Lab 02/01/17 0152  LIPASE 70*   No results for input(s): AMMONIA in the last 168 hours. Coagulation Profile: No results for input(s): INR, PROTIME in the  last 168 hours. Cardiac Enzymes: No results for input(s): CKTOTAL, CKMB, CKMBINDEX, TROPONINI in the last 168 hours. BNP (last 3 results) No results for input(s): PROBNP in the last 8760 hours. HbA1C: No results for input(s): HGBA1C in the last 72 hours. CBG: No results for input(s): GLUCAP in the last 168 hours. Lipid Profile: No results for input(s): CHOL, HDL, LDLCALC, TRIG, CHOLHDL, LDLDIRECT in the last 72 hours. Thyroid Function Tests: No results for input(s): TSH, T4TOTAL, FREET4, T3FREE, THYROIDAB in the last 72 hours. Anemia Panel: No results for input(s): VITAMINB12, FOLATE, FERRITIN, TIBC, IRON, RETICCTPCT in the last 72 hours. Sepsis Labs: No results for input(s): PROCALCITON, LATICACIDVEN in the last 168 hours.  No results found for this or any previous visit (from the past 240 hour(s)).       Radiology Studies: Ct Abdomen Pelvis W Contrast  Result Date: 02/01/2017 CLINICAL DATA:  Nausea and vomiting for 6 days. Abdominal distention. White cell count 10.5. Elevated lipase. microhematuria. History of hypertension, chronic kidney disease, prostatitis, umbilical hernia repair, and appendectomy. EXAM: CT ABDOMEN AND PELVIS WITH CONTRAST TECHNIQUE: Multidetector CT imaging of the abdomen and pelvis was performed using the standard protocol following bolus administration of intravenous contrast. CONTRAST:  100 mL Isovue-300 COMPARISON:  05/18/2016 FINDINGS: Lower chest: Mild dependent atelectasis in the lung bases. Linear fibrosis also on the lung bases. Small esophageal hiatal hernia. Hepatobiliary: Diffuse fatty infiltration of the liver. Small stones layering in the gallbladder. Mild gallbladder distention without wall thickening or edema. No bile duct dilatations. Pancreas: Unremarkable. No pancreatic ductal dilatation or surrounding inflammatory changes. Spleen: Normal in size without focal abnormality. Adrenals/Urinary Tract: No adrenal gland nodules. Small subcentimeter cysts  in the kidneys. Nephrograms are symmetrical and homogeneous. No hydronephrosis or hydroureter. No bladder wall thickening. Stomach/Bowel: Fluid-filled mildly dilated distal small bowel with mild wall thickening. Mesenteric edema and interloop fluid. Findings may represent enteritis, early or partial small bowel obstruction, inflammatory bowel disease. Ischemia is not excluded although no pneumatosis or portal venous gas is present and visualized mesenteric vessels appear patent. Colon is not abnormally distended. Scattered stool throughout the colon. No inflammatory changes. Appendix is surgically absent. Vascular/Lymphatic: Aortic atherosclerosis. No enlarged abdominal or pelvic lymph nodes. Reproductive: Prostate is unremarkable. Small amount of free fluid in the pelvis. Other: No free air in the abdomen. Abdominal wall musculature appears intact. Musculoskeletal: Degenerative changes in the spine. No destructive bone lesions. IMPRESSION: Mid/distal fluid-filled mildly dilated small bowel with wall thickening and mesenteric fluid. Changes likely represent enteritis or early/ partial small bowel obstruction. Inflammatory bowel disease or ischemia could also have this appearance but felt less likely. Appearances are similar to prior study. Diffuse fatty infiltration of the liver. Cholelithiasis. Electronically Signed   By: Burman Nieves M.D.   On: 02/01/2017 04:31  Dg Abd 2 Views  Result Date: 02/02/2017 CLINICAL DATA:  61 year old male with nausea, vomiting and diarrhea EXAM: ABDOMEN - 2 VIEW COMPARISON:  CT scan of the abdomen and pelvis obtained yesterday FINDINGS: Comparing to this scout view of the abdomen from yesterday's CT scan, there are persistent mildly dilated in air-fluid loops of small bowel in the mid abdomen. There are several fluid levels on the upright radiograph. No free air. The findings may be slightly progressed on today's examination. There is subtle thickening of the bowel wall  consistent with submucosal edema. Unremarkable colonic stool burden. Osseous structures are intact and unremarkable for age. IMPRESSION: Similar to slightly progressed pattern of multiple loops of air filled and slightly dilated small bowel with air-fluid levels compared to 02/01/2017. Findings remain consistent with either infectious/inflammatory enteritis with secondary ileus versus developing small bowel obstruction. Electronically Signed   By: Malachy Moan M.D.   On: 02/02/2017 10:54        Scheduled Meds: . amLODipine  2.5 mg Oral Daily  . bisacodyl  10 mg Rectal Daily  . divalproex  1,000 mg Oral QHS  . DULoxetine  120 mg Oral QHS  . enoxaparin (LOVENOX) injection  40 mg Subcutaneous Q24H  . lactated ringers  1,000 mL Intravenous Once  . lamoTRIgine  200 mg Oral Daily  . levOCARNitine  500 mg Oral BID  . lip balm  1 application Topical BID  . losartan  50 mg Oral Daily  . pramipexole  0.5 mg Oral BID   Continuous Infusions: . sodium chloride 75 mL/hr at 02/01/17 2120     LOS: 0 days       Coralie Keens, MD [facility] Pager [phone]  If 7PM-7AM, please contact night-coverage [email or web address] Password [facility] 02/02/2017, 11:25 AM

## 2017-02-03 ENCOUNTER — Encounter (HOSPITAL_COMMUNITY): Payer: Self-pay

## 2017-02-03 ENCOUNTER — Inpatient Hospital Stay (HOSPITAL_COMMUNITY): Payer: BLUE CROSS/BLUE SHIELD

## 2017-02-03 DIAGNOSIS — K56600 Partial intestinal obstruction, unspecified as to cause: Principal | ICD-10-CM

## 2017-02-03 DIAGNOSIS — R140 Abdominal distension (gaseous): Secondary | ICD-10-CM

## 2017-02-03 LAB — BASIC METABOLIC PANEL
ANION GAP: 7 (ref 5–15)
BUN: 10 mg/dL (ref 6–20)
CALCIUM: 8.9 mg/dL (ref 8.9–10.3)
CO2: 26 mmol/L (ref 22–32)
CREATININE: 1.05 mg/dL (ref 0.61–1.24)
Chloride: 108 mmol/L (ref 101–111)
GFR calc non Af Amer: >60 mL/min (ref >60)
GLUCOSE: 112 mg/dL — AB (ref 65–99)
Potassium: 3.5 mmol/L (ref 3.5–5.1)
Sodium: 141 mmol/L (ref 135–145)

## 2017-02-03 LAB — CBC
HCT: 31.1 % — ABNORMAL LOW (ref 39.0–52.0)
HEMOGLOBIN: 10.8 g/dL — AB (ref 13.0–17.0)
MCH: 31 pg (ref 26.0–34.0)
MCHC: 34.7 g/dL (ref 30.0–36.0)
MCV: 89.4 fL (ref 78.0–100.0)
Platelets: 253 10*3/uL (ref 150–400)
RBC: 3.48 MIL/uL — AB (ref 4.22–5.81)
RDW: 13.6 % (ref 11.5–15.5)
WBC: 7.4 10*3/uL (ref 4.0–10.5)

## 2017-02-03 LAB — MAGNESIUM: Magnesium: 1.7 mg/dL (ref 1.7–2.4)

## 2017-02-03 IMAGING — DX DG ABDOMEN 1V
2 series · 2 of 2 positions shown · non-contrast
Comparison: [DATE].  Abdomen and pelvis CT dated [DATE].

CLINICAL DATA: Nausea, vomiting and diarrhea for the past week.
Abdominal distention.

EXAM:
ABDOMEN - 1 VIEW

[abdomen kub (1 of 2)]
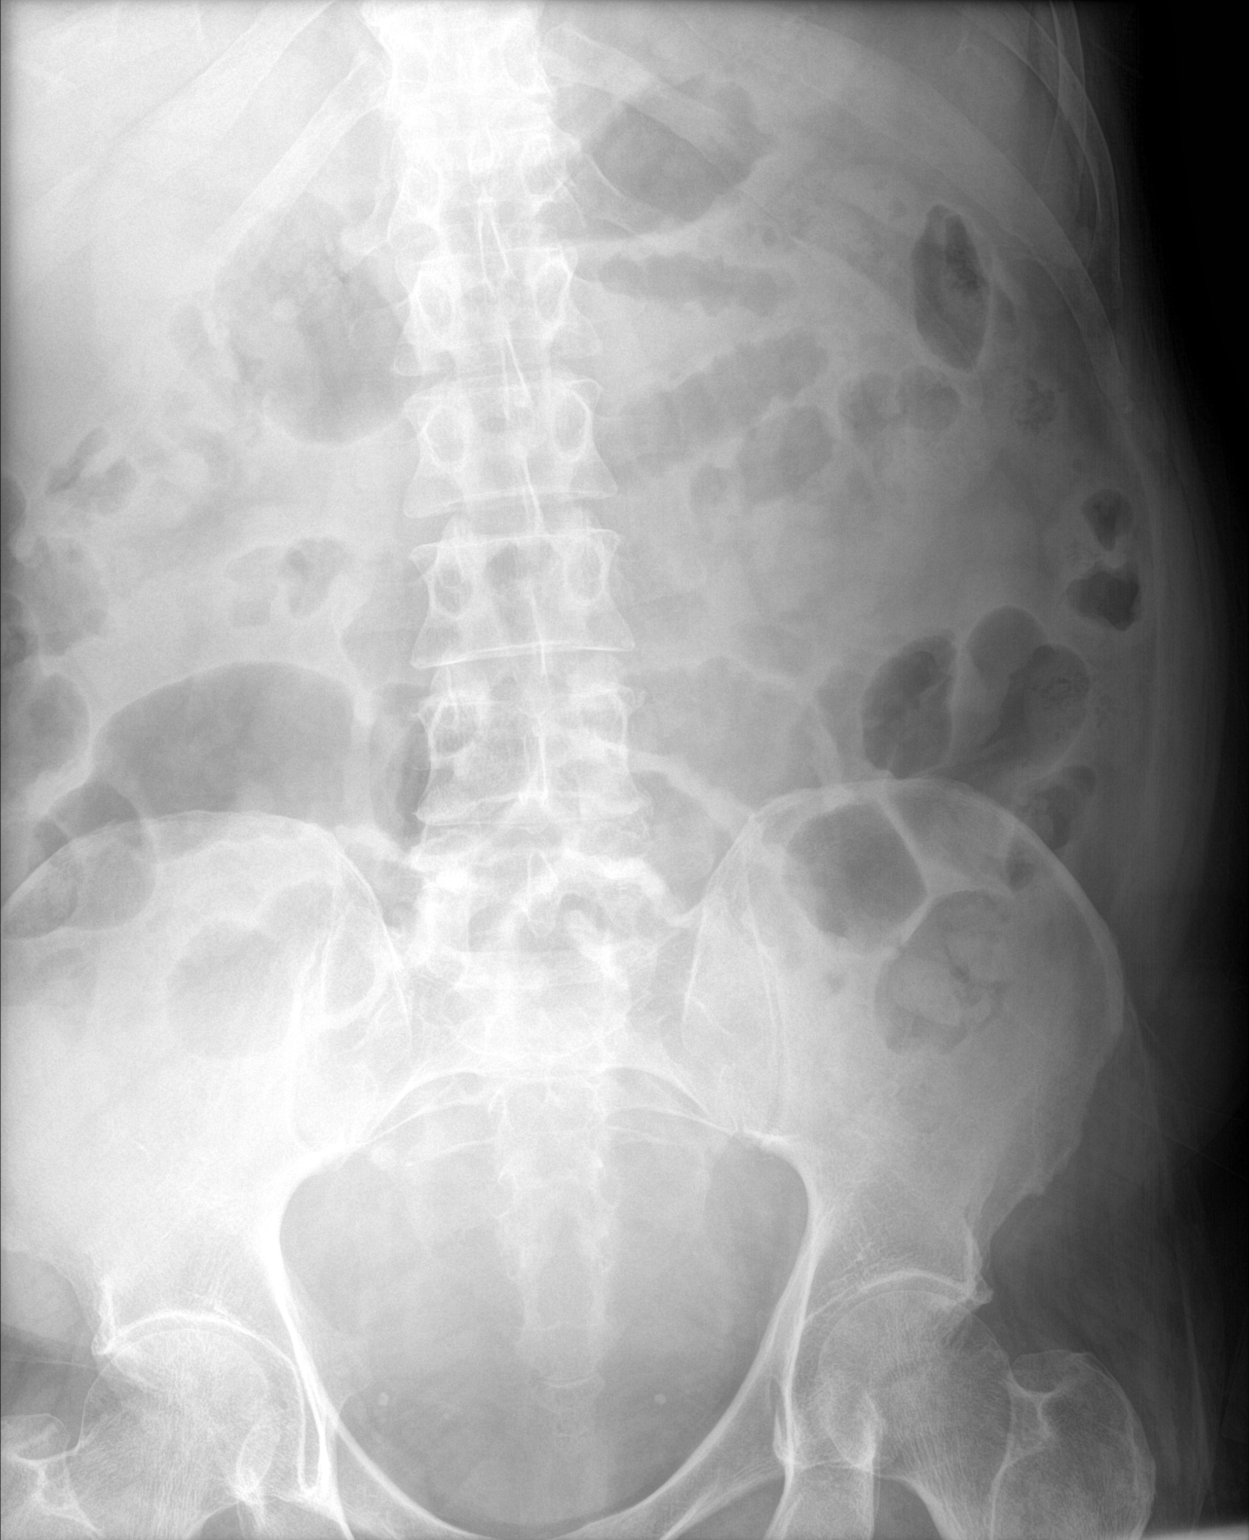

[abdomen kub (2 of 2)]
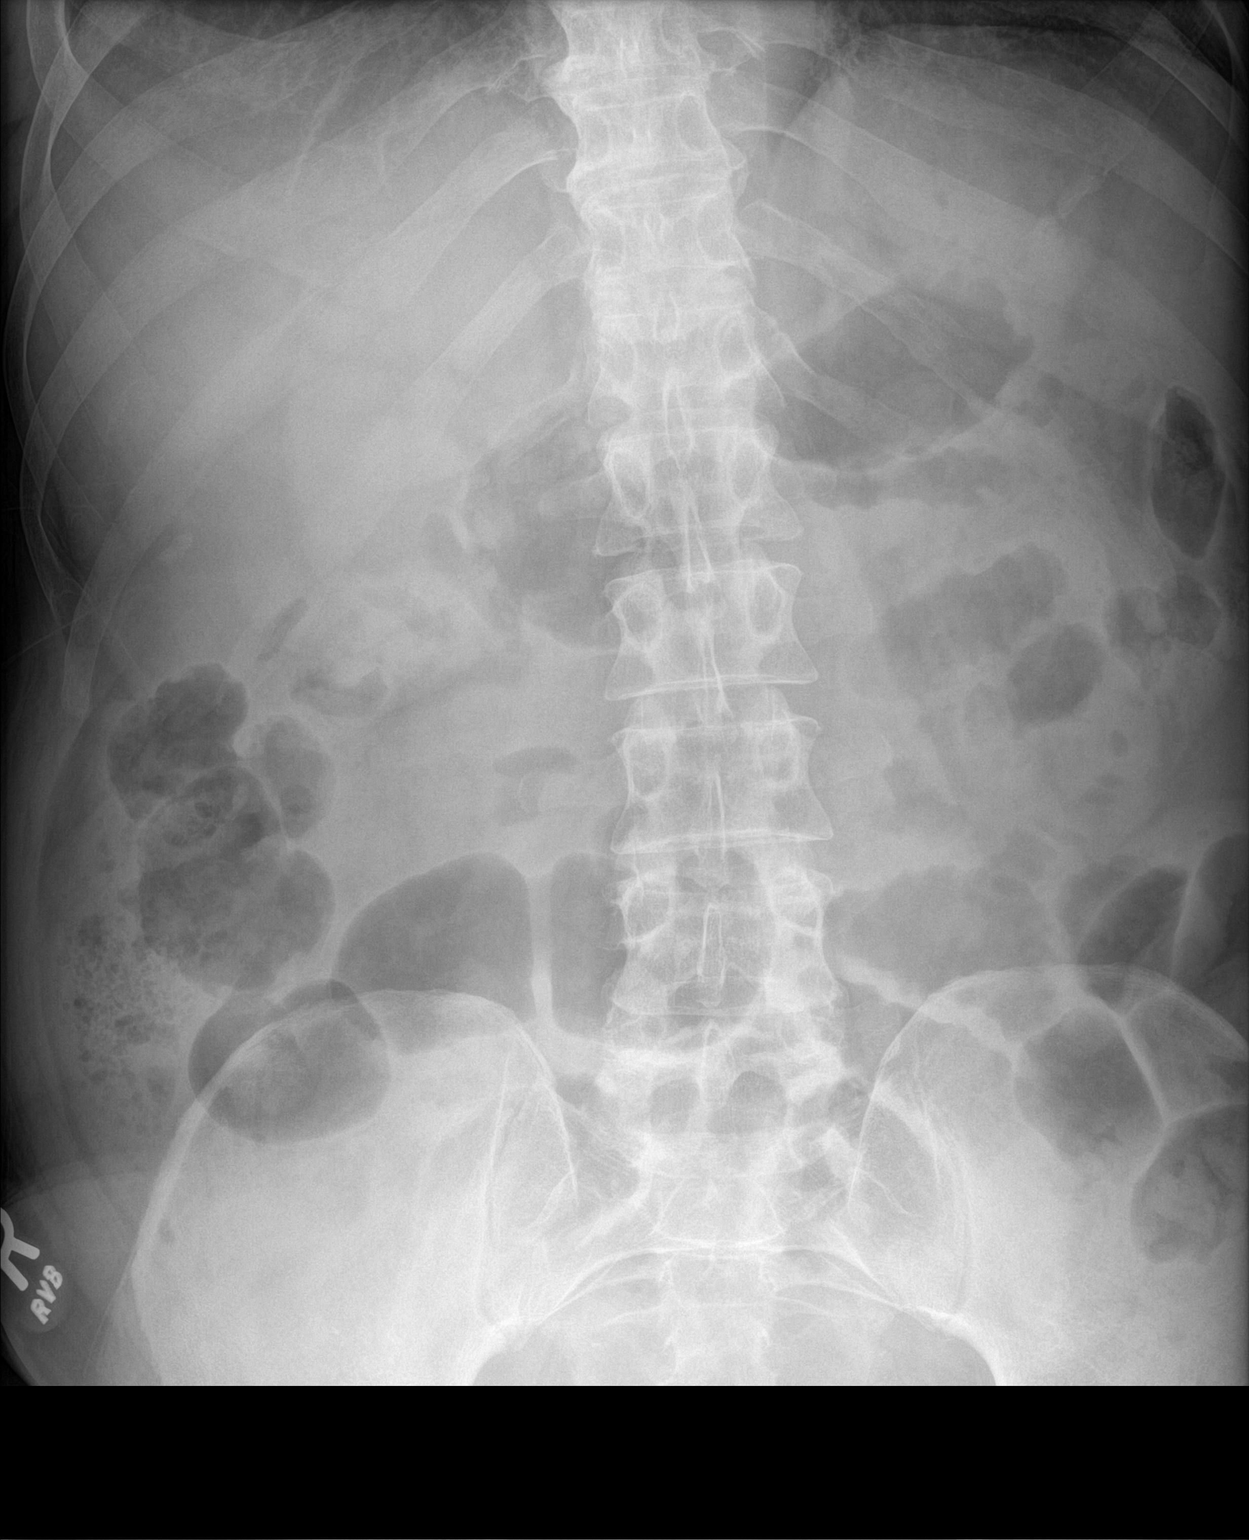

[2 of 2 positions shown; findings below may reference images not displayed]

FINDINGS: Mild increase in prominence of the gas-filled bowel loops with fewer
dilated loops. Bilateral pelvic phleboliths. Minimal lumbar spine
degenerative changes. Lower thoracic spine degenerative changes.
IMPRESSION: Mildly improving ileus or partial small bowel obstruction.

## 2017-02-03 MED ORDER — SODIUM CHLORIDE 0.9% FLUSH: 3.0000 mL | INTRAVENOUS | Status: DC | PRN

## 2017-02-03 MED ORDER — LACTATED RINGERS IV SOLN
INTRAVENOUS | Status: DC
  Administered 2017-02-03: 16:00:00 via INTRAVENOUS

## 2017-02-03 MED ORDER — LACTATED RINGERS IV BOLUS (SEPSIS): 1000.0000 mL | Freq: Three times a day (TID) | INTRAVENOUS | Status: DC | PRN

## 2017-02-03 MED ORDER — SODIUM CHLORIDE 0.9% FLUSH
3.0000 mL | Freq: Two times a day (BID) | INTRAVENOUS | Status: DC
  Administered 2017-02-03: 3 mL via INTRAVENOUS

## 2017-02-03 MED ORDER — ACETAMINOPHEN 325 MG PO TABS
650.0000 mg | ORAL_TABLET | ORAL | Status: DC | PRN
  Administered 2017-02-03: 650 mg via ORAL

## 2017-02-03 MED ORDER — POTASSIUM CHLORIDE 20 MEQ/15ML (10%) PO SOLN
40.0000 meq | Freq: Once | ORAL | Status: AC
  Administered 2017-02-03: 40 meq via ORAL
  Filled 2017-02-03: qty 30

## 2017-02-03 MED ORDER — SODIUM CHLORIDE 0.9 % IV SOLN: 250.0000 mL | INTRAVENOUS | Status: DC | PRN

## 2017-02-03 NOTE — Progress Notes (Signed)
  Subjective: Denies pain or nausea this morning Passing some flatus  Objective: Vital signs in last 24 hours: Temp:  [97.4 F (36.3 C)-98.2 F (36.8 C)] 97.4 F (36.3 C) (03/18 0515) Pulse Rate:  [60-72] 63 (03/18 0515) Resp:  [18] 18 (03/18 0515) BP: (140-168)/(85-92) 157/88 (03/18 0515) SpO2:  [94 %-100 %] 97 % (03/18 0515) Last BM Date: 02/02/17  Intake/Output from previous day: 03/17 0701 - 03/18 0700 In: 3973.8 [P.O.:1080; I.V.:1893.8; IV Piggyback:1000] Out: 5250 [Urine:5250] Intake/Output this shift: Total I/O In: -  Out: 900 [Urine:900]  Exam: Abdomen still bloated, non tender  Lab Results:   Recent Labs  02/01/17 0205 02/03/17 0527  WBC 10.5 7.4  HGB 13.2 10.8*  HCT 37.9* 31.1*  PLT 333 253   BMET  Recent Labs  02/02/17 0819 02/03/17 0527  NA 141 141  K 3.9 3.5  CL 108 108  CO2 24 26  GLUCOSE 97 112*  BUN 13 10  CREATININE 1.17 1.05  CALCIUM 8.8* 8.9   PT/INR No results for input(s): LABPROT, INR in the last 72 hours. ABG No results for input(s): PHART, HCO3 in the last 72 hours.  Invalid input(s): PCO2, PO2  Studies/Results: Dg Abd 1 View  Result Date: 02/03/2017 CLINICAL DATA:  Nausea, vomiting and diarrhea for the past week. Abdominal distention. EXAM: ABDOMEN - 1 VIEW COMPARISON:  02/02/2017.  Abdomen and pelvis CT dated 02/01/2017. FINDINGS: Mild increase in prominence of the gas-filled bowel loops with fewer dilated loops. Bilateral pelvic phleboliths. Minimal lumbar spine degenerative changes. Lower thoracic spine degenerative changes. IMPRESSION: Mildly improving ileus or partial small bowel obstruction. Electronically Signed   By: Beckie Salts M.D.   On: 02/03/2017 08:26   Dg Abd 2 Views  Result Date: 02/02/2017 CLINICAL DATA:  61 year old male with nausea, vomiting and diarrhea EXAM: ABDOMEN - 2 VIEW COMPARISON:  CT scan of the abdomen and pelvis obtained yesterday FINDINGS: Comparing to this scout view of the abdomen from  yesterday's CT scan, there are persistent mildly dilated in air-fluid loops of small bowel in the mid abdomen. There are several fluid levels on the upright radiograph. No free air. The findings may be slightly progressed on today's examination. There is subtle thickening of the bowel wall consistent with submucosal edema. Unremarkable colonic stool burden. Osseous structures are intact and unremarkable for age. IMPRESSION: Similar to slightly progressed pattern of multiple loops of air filled and slightly dilated small bowel with air-fluid levels compared to 02/01/2017. Findings remain consistent with either infectious/inflammatory enteritis with secondary ileus versus developing small bowel obstruction. Electronically Signed   By: Malachy Moan M.D.   On: 02/02/2017 10:54    Anti-infectives: Anti-infectives    None      Assessment/Plan: psbo vs gastroenteritis vs IBD  Xray improved today but still with distended bowel.  Will repeat in the morning. Keep on clears   LOS: 1 day    Edwin [name] A 02/03/2017

## 2017-02-03 NOTE — Progress Notes (Signed)
PROGRESS NOTE    Edwin [name]  ZOX:[id number] DOB: Nov 23, 1955 DOA: 02/01/2017 PCP: [name], MD    Brief Narrative:  61 yo male with HTN and CKD presents with nausea, vomiting and abdominal pain for the last 7 days. Unable to tolerate po diet besides liquids. On the initial physical examination patient hemodynamically stable, abdomen distended. CT with signs of enteritis or earle small bowel obstruction. Admitted for supportive medical care and surgical consultation.   Assessment & Plan:   Principal Problem:   Enteritis of ileum Active Problems:   Essential hypertension   Bipolar affective disorder, depressed, severe (HCC)   CKD (chronic kidney disease), stage II   Hypokalemia   Persistent recurrent vomiting   SBO (small bowel obstruction)   1. Abdominal pain to rule out early small bowel obstruction. Abdominal films with persistent bowel dilatation, fluid levels not evident but patient with significant abdominal distention, tolerating clears with no nausea or vomiting. Continue supportive IV fluids, antiemetics with metoclopramide and zofran an needed. Follow on abdominal films in am and surgical recommendations.   2. CKD stage 2. Renal function with cr at 1.105 with K at 3,5 and serum bicarbonate 26. Continue hydration with balanced electrolyte solutions with LR, will add kcl 40 meq po x1, follow on renal function and electrolytes in am.  3. HTN. Continue amlodipine and losartan, systolic blood pressure 140 to 170. Continue hydralazine as needed.   4. Bipolar. On depakote, duloxetine, lamotrigine and mirapex. No confusion or agitation.    DVT prophylaxis: enoxaparin  Code Status: full  Family Communication: No family at the bedside  Disposition Plan: home    Consultants:   General surgery   Procedures:    Antimicrobials:   Subjective: Patient with persistent abdominal distention, no nausea or vomiting, tolerating clears. No fever or chills    Objective: Vitals:   02/02/17 1530 02/02/17 2059 02/03/17 0515 02/03/17 1117  BP: (!) 140/92 (!) 149/85 (!) 157/88 (!) 143/87  Pulse: 60 72 63 63  Resp: 18 18 18    Temp: 98.2 F (36.8 C) 97.5 F (36.4 C) 97.4 F (36.3 C)   TempSrc: Oral Oral Oral   SpO2: 100% 94% 97%   Weight:      Height: 5\' 11"  (1.803 m)       Intake/Output Summary (Last 24 hours) at 02/03/17 1211 Last data filed at 02/03/17 0818  Gross per 24 hour  Intake          3493.75 ml  Output             5850 ml  Net         -2356.25 ml   Filed Weights   02/01/17 0117 02/01/17 0958  Weight: 102.1 kg (225 lb) 99.8 kg (220 lb)    Examination:  General exam: not in pain or dyspnea E ENT: No pallor or icterus, oral mucosa moist Respiratory system: Clear to auscultation. Respiratory effort normal. No wheezing, rales or rhonchi.  Cardiovascular system: S1 & S2 heard, RRR. No JVD, murmurs, rubs, gallops or clicks. No pedal edema. Gastrointestinal system: Abdomen is distended, tympanic, nontender. No organomegaly or masses felt. Decreased bowel sounds heard. Central nervous system: Alert and oriented. No focal neurological deficits. Extremities: Symmetric 5 x 5 power. Skin: No rashes, lesions or ulcers.      Data Reviewed: I have personally reviewed following labs and imaging studies  CBC:  Recent Labs Lab 02/01/17 0205 02/03/17 0527  WBC 10.5 7.4  NEUTROABS 6.4  --  HGB 13.2 10.8*  HCT 37.9* 31.1*  MCV 87.3 89.4  PLT 333 253   Basic Metabolic Panel:  Recent Labs Lab 02/01/17 0152 02/02/17 0819 02/03/17 0527  NA 134* 141 141  K 3.1* 3.9 3.5  CL 101 108 108  CO2 22 24 26   GLUCOSE 146* 97 112*  BUN 15 13 10   CREATININE 1.21 1.17 1.05  CALCIUM 8.8* 8.8* 8.9  MG  --   --  1.7   GFR: Estimated Creatinine Clearance: 92.3 mL/min (by C-G formula based on SCr of 1.05 mg/dL). Liver Function Tests:  Recent Labs Lab 02/01/17 0152  AST 21  ALT 27  ALKPHOS 56  BILITOT 0.6  PROT 6.9   ALBUMIN 3.8    Recent Labs Lab 02/01/17 0152  LIPASE 70*   No results for input(s): AMMONIA in the last 168 hours. Coagulation Profile: No results for input(s): INR, PROTIME in the last 168 hours. Cardiac Enzymes: No results for input(s): CKTOTAL, CKMB, CKMBINDEX, TROPONINI in the last 168 hours. BNP (last 3 results) No results for input(s): PROBNP in the last 8760 hours. HbA1C: No results for input(s): HGBA1C in the last 72 hours. CBG: No results for input(s): GLUCAP in the last 168 hours. Lipid Profile: No results for input(s): CHOL, HDL, LDLCALC, TRIG, CHOLHDL, LDLDIRECT in the last 72 hours. Thyroid Function Tests: No results for input(s): TSH, T4TOTAL, FREET4, T3FREE, THYROIDAB in the last 72 hours. Anemia Panel: No results for input(s): VITAMINB12, FOLATE, FERRITIN, TIBC, IRON, RETICCTPCT in the last 72 hours. Sepsis Labs: No results for input(s): PROCALCITON, LATICACIDVEN in the last 168 hours.  No results found for this or any previous visit (from the past 240 hour(s)).       Radiology Studies: Dg Abd 1 View  Result Date: 02/03/2017 CLINICAL DATA:  Nausea, vomiting and diarrhea for the past week. Abdominal distention. EXAM: ABDOMEN - 1 VIEW COMPARISON:  02/02/2017.  Abdomen and pelvis CT dated 02/01/2017. FINDINGS: Mild increase in prominence of the gas-filled bowel loops with fewer dilated loops. Bilateral pelvic phleboliths. Minimal lumbar spine degenerative changes. Lower thoracic spine degenerative changes. IMPRESSION: Mildly improving ileus or partial small bowel obstruction. Electronically Signed   By: Beckie Salts M.D.   On: 02/03/2017 08:26   Dg Abd 2 Views  Result Date: 02/02/2017 CLINICAL DATA:  61 year old male with nausea, vomiting and diarrhea EXAM: ABDOMEN - 2 VIEW COMPARISON:  CT scan of the abdomen and pelvis obtained yesterday FINDINGS: Comparing to this scout view of the abdomen from yesterday's CT scan, there are persistent mildly dilated in  air-fluid loops of small bowel in the mid abdomen. There are several fluid levels on the upright radiograph. No free air. The findings may be slightly progressed on today's examination. There is subtle thickening of the bowel wall consistent with submucosal edema. Unremarkable colonic stool burden. Osseous structures are intact and unremarkable for age. IMPRESSION: Similar to slightly progressed pattern of multiple loops of air filled and slightly dilated small bowel with air-fluid levels compared to 02/01/2017. Findings remain consistent with either infectious/inflammatory enteritis with secondary ileus versus developing small bowel obstruction. Electronically Signed   By: Malachy Moan M.D.   On: 02/02/2017 10:54        Scheduled Meds: . amLODipine  2.5 mg Oral Daily  . bisacodyl  10 mg Rectal Daily  . divalproex  1,000 mg Oral QHS  . DULoxetine  120 mg Oral QHS  . enoxaparin (LOVENOX) injection  40 mg Subcutaneous Q24H  . lamoTRIgine  200 mg Oral Daily  . levOCARNitine  500 mg Oral BID  . lip balm  1 application Topical BID  . losartan  50 mg Oral Daily  . pramipexole  0.5 mg Oral BID  . sodium chloride flush  3 mL Intravenous Q12H   Continuous Infusions:   LOS: 1 day        Deborah Lazcano Annett Gula, MD [facility] Pager [phone]  If 7PM-7AM, please contact night-coverage [email or web address] Password [facility] 02/03/2017, 12:11 PM

## 2017-02-04 ENCOUNTER — Inpatient Hospital Stay (HOSPITAL_COMMUNITY): Payer: BLUE CROSS/BLUE SHIELD

## 2017-02-04 ENCOUNTER — Ambulatory Visit: Payer: Self-pay | Admitting: Cardiology

## 2017-02-04 DIAGNOSIS — N182 Chronic kidney disease, stage 2 (mild): Secondary | ICD-10-CM

## 2017-02-04 LAB — BASIC METABOLIC PANEL
Anion gap: 8 (ref 5–15)
BUN: 10 mg/dL (ref 6–20)
CHLORIDE: 105 mmol/L (ref 101–111)
CO2: 27 mmol/L (ref 22–32)
CREATININE: 1.07 mg/dL (ref 0.61–1.24)
Calcium: 9.2 mg/dL (ref 8.9–10.3)
GFR calc Af Amer: >60 mL/min (ref >60)
GFR calc non Af Amer: >60 mL/min (ref >60)
GLUCOSE: 120 mg/dL — AB (ref 65–99)
Potassium: 4.1 mmol/L (ref 3.5–5.1)
Sodium: 140 mmol/L (ref 135–145)

## 2017-02-04 IMAGING — DX DG ABDOMEN 2V
3 series · 3 of 3 positions shown · non-contrast
Comparison: [DATE]

CLINICAL DATA: Abdominal distention with nausea and vomiting

EXAM:
ABDOMEN - 2 VIEW

[abdomen erect]
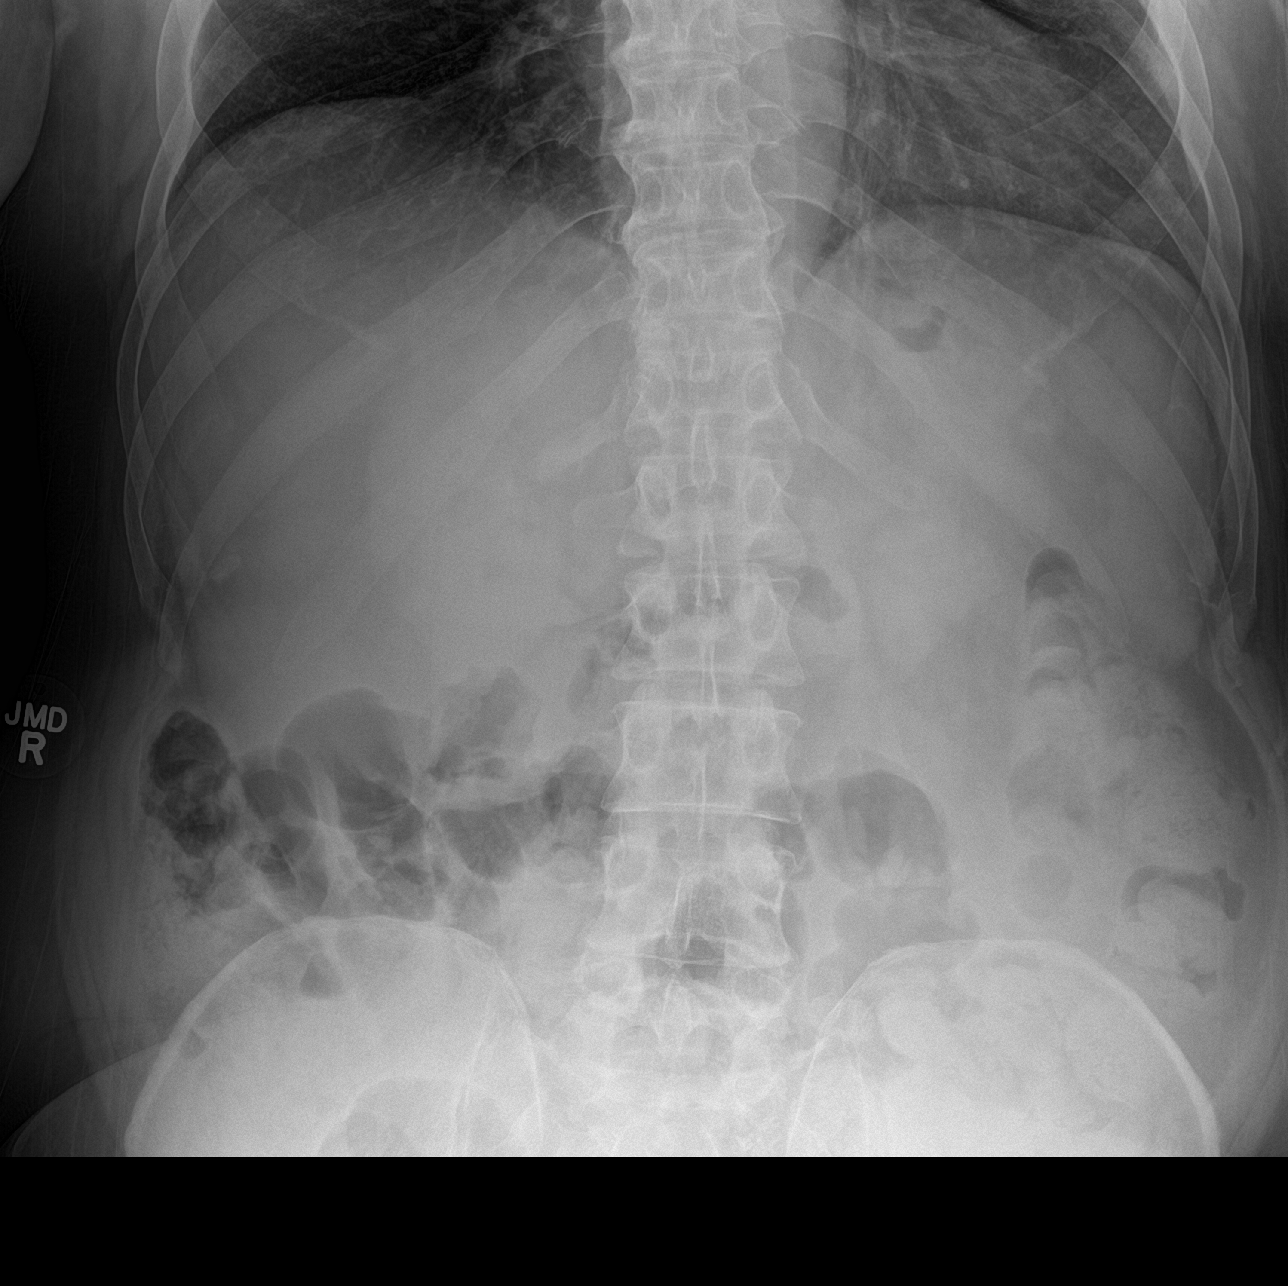

[abdomen supine (1 of 2)]
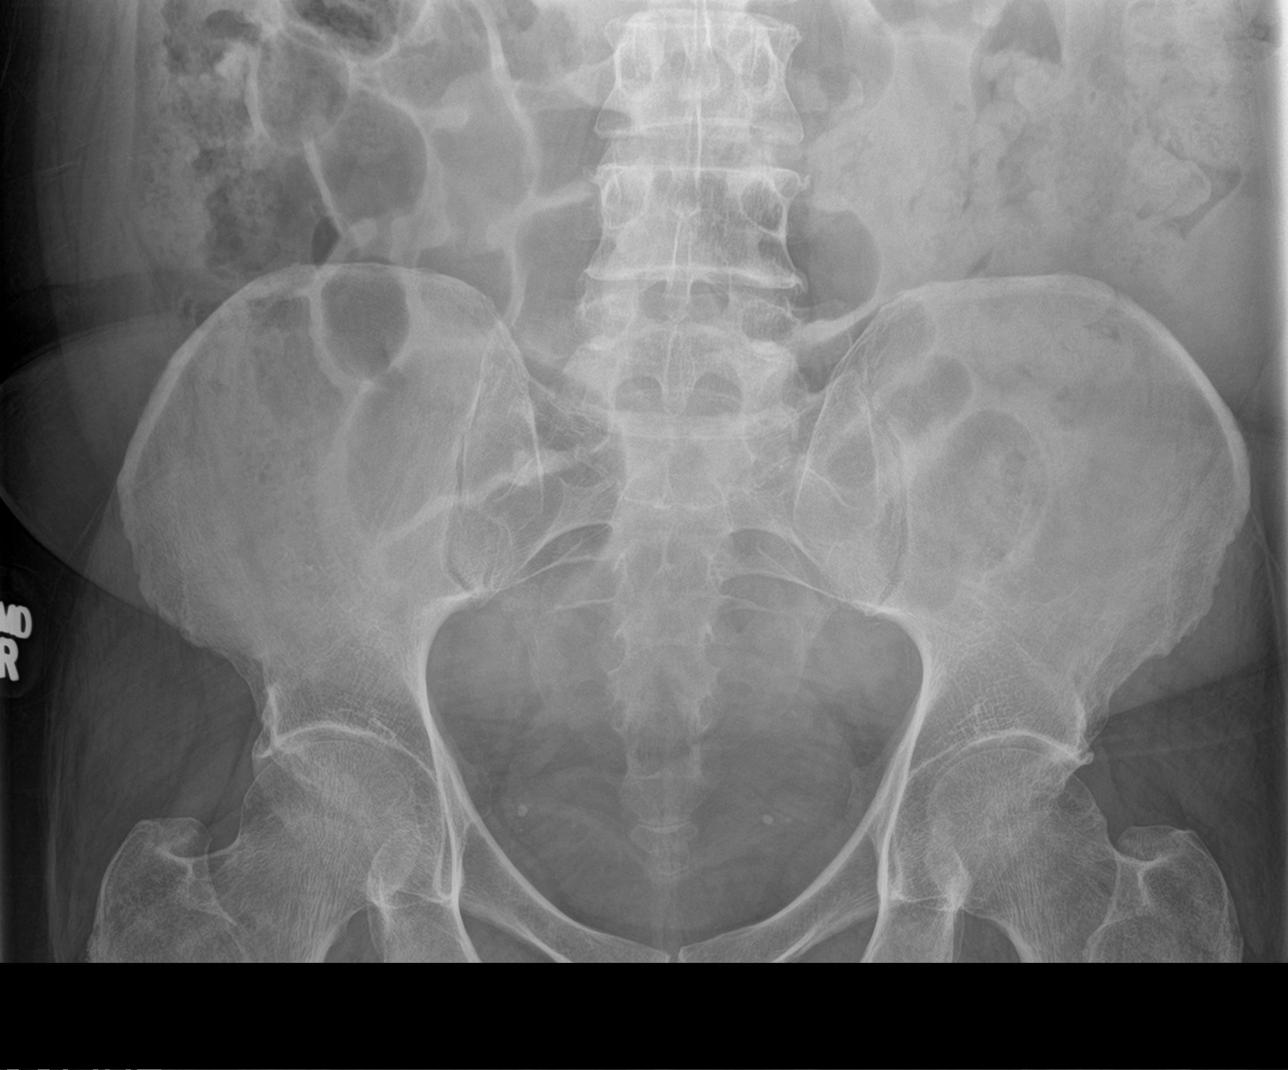

[abdomen supine (2 of 2)]
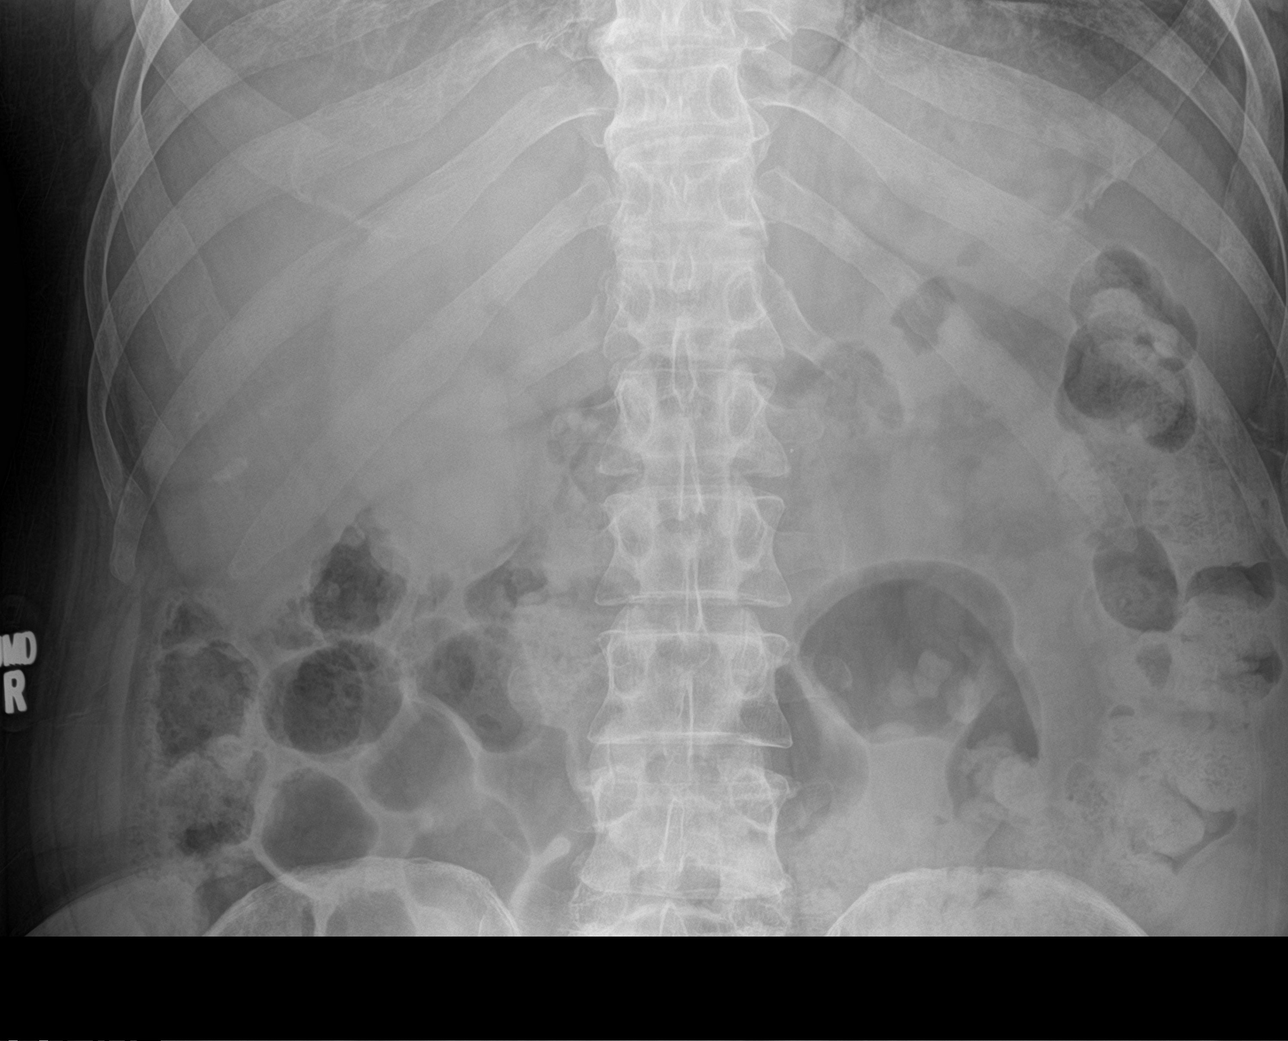

[3 of 3 positions shown; findings below may reference images not displayed]

FINDINGS: Supine and upright images obtained. There is moderate stool
throughout the colon. There is no bowel dilatation or air-fluid
levels suggesting bowel obstruction. No free air. Visualized lung
bases are clear. Small calcifications in the pelvis are consistent
with phleboliths.
IMPRESSION: Moderate stool throughout colon. No evident bowel obstruction or
free air.

## 2017-02-04 MED ORDER — FLEET ENEMA 7-19 GM/118ML RE ENEM
1.0000 | ENEMA | Freq: Once | RECTAL | Status: AC
  Administered 2017-02-04: 1 via RECTAL
  Filled 2017-02-04: qty 1

## 2017-02-04 MED ORDER — POLYETHYLENE GLYCOL 3350 17 G PO PACK: 17.0000 g | PACK | Freq: Every day | ORAL | 0 refills | Status: DC

## 2017-02-04 NOTE — Progress Notes (Signed)
  Subjective: Reports lots of flatus, no bm, no pain some fullness; on liquid diet; no n/v  Objective: Vital signs in last 24 hours: Temp:  [97.9 F (36.6 C)-98.6 F (37 C)] 98.6 F (37 C) (03/19 0251) Pulse Rate:  [61-74] 74 (03/19 0251) Resp:  [18] 18 (03/19 0251) BP: (140-156)/(87-99) 140/99 (03/19 0251) SpO2:  [98 %-100 %] 98 % (03/19 0251) Last BM Date: 02/02/17  Intake/Output from previous day: 03/18 0701 - 03/19 0700 In: 2241.7 [P.O.:1690; I.V.:551.7] Out: 4875 [Urine:4875] Intake/Output this shift: No intake/output data recorded.  Alert, nontoxic, not ill appearing Soft, nt, nd  Lab Results:   Recent Labs  02/03/17 0527  WBC 7.4  HGB 10.8*  HCT 31.1*  PLT 253   BMET  Recent Labs  02/03/17 0527 02/04/17 0525  NA 141 140  K 3.5 4.1  CL 108 105  CO2 26 27  GLUCOSE 112* 120*  BUN 10 10  CREATININE 1.05 1.07  CALCIUM 8.9 9.2   PT/INR No results for input(s): LABPROT, INR in the last 72 hours. ABG No results for input(s): PHART, HCO3 in the last 72 hours.  Invalid input(s): PCO2, PO2  Studies/Results: Dg Abd 1 View  Result Date: 02/03/2017 CLINICAL DATA:  Nausea, vomiting and diarrhea for the past week. Abdominal distention. EXAM: ABDOMEN - 1 VIEW COMPARISON:  02/02/2017.  Abdomen and pelvis CT dated 02/01/2017. FINDINGS: Mild increase in prominence of the gas-filled bowel loops with fewer dilated loops. Bilateral pelvic phleboliths. Minimal lumbar spine degenerative changes. Lower thoracic spine degenerative changes. IMPRESSION: Mildly improving ileus or partial small bowel obstruction. Electronically Signed   By: Beckie Salts M.D.   On: 02/03/2017 08:26   Dg Abd 2 Views  Result Date: 02/04/2017 CLINICAL DATA:  Abdominal distention with nausea and vomiting EXAM: ABDOMEN - 2 VIEW COMPARISON:  February 03, 2017 FINDINGS: Supine and upright images obtained. There is moderate stool throughout the colon. There is no bowel dilatation or air-fluid levels  suggesting bowel obstruction. No free air. Visualized lung bases are clear. Small calcifications in the pelvis are consistent with phleboliths. IMPRESSION: Moderate stool throughout colon. No evident bowel obstruction or free air. Electronically Signed   By: Bretta Bang III M.D.   On: 02/04/2017 09:05    Anti-infectives: Anti-infectives    None      Assessment/Plan: Gastroenteritis >>ileus>>psbo  Clinically improved. Having flatus. Bowel gas pattern improved on films  Will adv diet.  If tolerates lunch, can go home from our perspective Will need outpt f/u with GI  Edwin [name]. Andrey Campanile, MD, FACS General, Bariatric, & Minimally Invasive Surgery [facility], [place]   LOS: 2 days    Edwin [name] 02/04/2017

## 2017-02-04 NOTE — Progress Notes (Signed)
Date: February 04, 2017 Discharge orders review for case management needs.  None found Marcelle Smiling, BSN, [place], [place]: [phone]

## 2017-02-04 NOTE — Discharge Summary (Signed)
Physician Discharge Summary  Edwin [name]:[id number] DOB: [birth year] DOA: 02/01/2017  PCP: [name], MD  Admit date: 02/01/2017 Discharge date: 02/04/2017  Admitted From: Home  Disposition:  Home   Recommendations for Outpatient Follow-up:  1. Follow up with PCP in 1- week 2. Patient will follow with Gastroenterology as outpatient 3. Patient placed on Miralax daily  Home Health: No  Equipment/Devices: No   Discharge Condition: Stable  CODE STATUS: Full  Diet recommendation: Heart Healthy  Brief/Interim Summary: This is a 61 yo male who presents with the chief complain of nausea, vomiting and diarrhea for last 7 days. Symptoms worse with by mouth intake. No fevers or chills. June last year he had a small bowel obstruction. On initial physical examination blood pressure 162/98, heart rate 80, respirations 16, temperature 99.8, oxygen saturation 99%. His mucous membranes were moist, his lungs were clear to auscultation, heart S1-S2 present rhythmic, his abdomen was distended and tender, no organomegaly, bowel sounds were decreased, lower extremities no edema. Sodium 134, potassium 3.1, chloride 101, bicarbonate 22, glucose 146, BUN 15, creatinine 1.21, white count 10.5, hemoglobin 13.2, hematocrit 37.9, platelets 333. Urinalysis negative for infection. Abdominal CT showing mid/distal fluid-filled mildly dilated small bowel with wall thickening and mesenteric fluid, suggestive of enteritis or partial small bowel obstruction.   The patient was admitted to the hospital working diagnosis of abdominal pain due to likely small bowel obstruction.   1. Ileus/partial small bowel obstruction. The patient was admitted to the medical floor, he was placed on IV fluids, IV antiemetics and antiacids. Clear liquid diet. He was placed on a conservative medical therapy, daily imaging showed improvement of gas pattern and resolving obstruction. Patient's symptoms have been improving his abdominal  distention has resolved, his diet was advanced progressively. He will follow-up with a gastroenterologist in outpatient. Surgery was consulted during this hospitalization for comanagement. Patient received aggressive bowel regimen during his hospitalization with Dulcolax, be discharged on MiraLAX.   2. Chronic kidney disease stage II. Patient received IV fluids, his creatinine remained stable between 1.0 and 1.2, his potassium was corrected, discharge potassium 4.1  3. Hypertension. Antihypertensive agents will resume including amlodipine and losartan with no major complications.  4. Bipolar. Patient was continued on Depakote, duloxetine, lamotrigine and Mirapex, no confusion or agitation.   Discharge Diagnoses:  Principal Problem:   Enteritis of ileum Active Problems:   Essential hypertension   Bipolar affective disorder, depressed, severe (HCC)   CKD (chronic kidney disease), stage II   Hypokalemia   Persistent recurrent vomiting   SBO (small bowel obstruction)    Discharge Instructions   Allergies as of 02/04/2017      Reactions   Allopurinol Other (See Comments)   Reaction:  Made pt unstable    Aspirin Hives   Penicillins Hives, Other (See Comments)   Has patient had a PCN reaction causing immediate rash, facial/tongue/throat swelling, SOB or lightheadedness with hypotension: No Has patient had a PCN reaction causing severe rash involving mucus membranes or skin necrosis: No Has patient had a PCN reaction that required hospitalization No Has patient had a PCN reaction occurring within the last 10 years: No If all of the above answers are "NO", then may proceed with Cephalosporin use.   Ciprofloxacin Rash      Medication List    TAKE these medications   amLODipine 2.5 MG tablet Commonly known as:  NORVASC Take 2.5 mg by mouth daily.   cholecalciferol 1000 units tablet Commonly known as:  VITAMIN  D Take 2 tablets (2,000 Units total) by mouth daily.   divalproex 500  MG 24 hr tablet Commonly known as:  DEPAKOTE ER Take 2 tablets (1,000 mg total) by mouth at bedtime. Take total of 1,000 mg total at bedtime.   DULoxetine 60 MG capsule Commonly known as:  CYMBALTA Take 2 capsules (120 mg total) by mouth at bedtime.   febuxostat 40 MG tablet Commonly known as:  ULORIC Take 40 mg by mouth daily.   L-Carnitine 500 MG Tabs Take 500 mg by mouth 2 (two) times daily.   lamoTRIgine 200 MG tablet Commonly known as:  LAMICTAL Take 200 mg by mouth daily.   losartan 50 MG tablet Commonly known as:  COZAAR Take 1 tablet (50 mg total) by mouth daily.   polyethylene glycol packet Commonly known as:  MIRALAX Take 17 g by mouth daily.   pramipexole 0.5 MG tablet Commonly known as:  MIRAPEX Take 0.5 mg by mouth 2 (two) times daily.       Allergies  Allergen Reactions  . Allopurinol Other (See Comments)    Reaction:  Made pt unstable   . Aspirin Hives  . Penicillins Hives and Other (See Comments)    Has patient had a PCN reaction causing immediate rash, facial/tongue/throat swelling, SOB or lightheadedness with hypotension: No Has patient had a PCN reaction causing severe rash involving mucus membranes or skin necrosis: No Has patient had a PCN reaction that required hospitalization No Has patient had a PCN reaction occurring within the last 10 years: No If all of the above answers are "NO", then may proceed with Cephalosporin use.  . Ciprofloxacin Rash    Consultations: Surgery    Procedures/Studies: Dg Abd 1 View  Result Date: 02/03/2017 CLINICAL DATA:  Nausea, vomiting and diarrhea for the past week. Abdominal distention. EXAM: ABDOMEN - 1 VIEW COMPARISON:  02/02/2017.  Abdomen and pelvis CT dated 02/01/2017. FINDINGS: Mild increase in prominence of the gas-filled bowel loops with fewer dilated loops. Bilateral pelvic phleboliths. Minimal lumbar spine degenerative changes. Lower thoracic spine degenerative changes. IMPRESSION: Mildly improving  ileus or partial small bowel obstruction. Electronically Signed   By: Beckie Salts M.D.   On: 02/03/2017 08:26   Ct Abdomen Pelvis W Contrast  Result Date: 02/01/2017 CLINICAL DATA:  Nausea and vomiting for 6 days. Abdominal distention. White cell count 10.5. Elevated lipase. microhematuria. History of hypertension, chronic kidney disease, prostatitis, umbilical hernia repair, and appendectomy. EXAM: CT ABDOMEN AND PELVIS WITH CONTRAST TECHNIQUE: Multidetector CT imaging of the abdomen and pelvis was performed using the standard protocol following bolus administration of intravenous contrast. CONTRAST:  100 mL Isovue-300 COMPARISON:  05/18/2016 FINDINGS: Lower chest: Mild dependent atelectasis in the lung bases. Linear fibrosis also on the lung bases. Small esophageal hiatal hernia. Hepatobiliary: Diffuse fatty infiltration of the liver. Small stones layering in the gallbladder. Mild gallbladder distention without wall thickening or edema. No bile duct dilatations. Pancreas: Unremarkable. No pancreatic ductal dilatation or surrounding inflammatory changes. Spleen: Normal in size without focal abnormality. Adrenals/Urinary Tract: No adrenal gland nodules. Small subcentimeter cysts in the kidneys. Nephrograms are symmetrical and homogeneous. No hydronephrosis or hydroureter. No bladder wall thickening. Stomach/Bowel: Fluid-filled mildly dilated distal small bowel with mild wall thickening. Mesenteric edema and interloop fluid. Findings may represent enteritis, early or partial small bowel obstruction, inflammatory bowel disease. Ischemia is not excluded although no pneumatosis or portal venous gas is present and visualized mesenteric vessels appear patent. Colon is not abnormally distended. Scattered stool throughout  the colon. No inflammatory changes. Appendix is surgically absent. Vascular/Lymphatic: Aortic atherosclerosis. No enlarged abdominal or pelvic lymph nodes. Reproductive: Prostate is unremarkable.  Small amount of free fluid in the pelvis. Other: No free air in the abdomen. Abdominal wall musculature appears intact. Musculoskeletal: Degenerative changes in the spine. No destructive bone lesions. IMPRESSION: Mid/distal fluid-filled mildly dilated small bowel with wall thickening and mesenteric fluid. Changes likely represent enteritis or early/ partial small bowel obstruction. Inflammatory bowel disease or ischemia could also have this appearance but felt less likely. Appearances are similar to prior study. Diffuse fatty infiltration of the liver. Cholelithiasis. Electronically Signed   By: Burman Nieves M.D.   On: 02/01/2017 04:31   Dg Abd 2 Views  Result Date: 02/04/2017 CLINICAL DATA:  Abdominal distention with nausea and vomiting EXAM: ABDOMEN - 2 VIEW COMPARISON:  February 03, 2017 FINDINGS: Supine and upright images obtained. There is moderate stool throughout the colon. There is no bowel dilatation or air-fluid levels suggesting bowel obstruction. No free air. Visualized lung bases are clear. Small calcifications in the pelvis are consistent with phleboliths. IMPRESSION: Moderate stool throughout colon. No evident bowel obstruction or free air. Electronically Signed   By: Bretta Bang III M.D.   On: 02/04/2017 09:05   Dg Abd 2 Views  Result Date: 02/02/2017 CLINICAL DATA:  61 year old male with nausea, vomiting and diarrhea EXAM: ABDOMEN - 2 VIEW COMPARISON:  CT scan of the abdomen and pelvis obtained yesterday FINDINGS: Comparing to this scout view of the abdomen from yesterday's CT scan, there are persistent mildly dilated in air-fluid loops of small bowel in the mid abdomen. There are several fluid levels on the upright radiograph. No free air. The findings may be slightly progressed on today's examination. There is subtle thickening of the bowel wall consistent with submucosal edema. Unremarkable colonic stool burden. Osseous structures are intact and unremarkable for age. IMPRESSION:  Similar to slightly progressed pattern of multiple loops of air filled and slightly dilated small bowel with air-fluid levels compared to 02/01/2017. Findings remain consistent with either infectious/inflammatory enteritis with secondary ileus versus developing small bowel obstruction. Electronically Signed   By: Malachy Moan M.D.   On: 02/02/2017 10:54       Subjective: Patient is feeling better, no nausea or vomiting, improved abdominal distention.   Discharge Exam: Vitals:   02/04/17 0251 02/04/17 1026  BP: (!) 140/99 (!) 138/95  Pulse: 74 (!) 57  Resp: 18   Temp: 98.6 F (37 C)    Vitals:   02/03/17 1418 02/03/17 2016 02/04/17 0251 02/04/17 1026  BP: (!) 143/87 (!) 156/90 (!) 140/99 (!) 138/95  Pulse: 61 67 74 (!) 57  Resp: 18 18 18    Temp: 97.9 F (36.6 C) 98.4 F (36.9 C) 98.6 F (37 C)   TempSrc: Oral Oral Oral   SpO2: 98% 100% 98%   Weight:      Height:        General: Pt is alert, awake, not in acute distress Cardiovascular: RRR, S1/S2 +, no rubs, no gallops Respiratory: CTA bilaterally, no wheezing, no rhonchi Abdominal: Soft, NT, ND, bowel sounds + Extremities: no edema, no cyanosis    The results of significant diagnostics from this hospitalization (including imaging, microbiology, ancillary and laboratory) are listed below for reference.     Microbiology: No results found for this or any previous visit (from the past 240 hour(s)).   Labs: BNP (last 3 results) No results for input(s): BNP in the last 8760 hours.  Basic Metabolic Panel:  Recent Labs Lab 02/01/17 0152 02/02/17 0819 02/03/17 0527 02/04/17 0525  NA 134* 141 141 140  K 3.1* 3.9 3.5 4.1  CL 101 108 108 105  CO2 22 24 26 27   GLUCOSE 146* 97 112* 120*  BUN 15 13 10 10   CREATININE 1.21 1.17 1.05 1.07  CALCIUM 8.8* 8.8* 8.9 9.2  MG  --   --  1.7  --    Liver Function Tests:  Recent Labs Lab 02/01/17 0152  AST 21  ALT 27  ALKPHOS 56  BILITOT 0.6  PROT 6.9  ALBUMIN 3.8     Recent Labs Lab 02/01/17 0152  LIPASE 70*   No results for input(s): AMMONIA in the last 168 hours. CBC:  Recent Labs Lab 02/01/17 0205 02/03/17 0527  WBC 10.5 7.4  NEUTROABS 6.4  --   HGB 13.2 10.8*  HCT 37.9* 31.1*  MCV 87.3 89.4  PLT 333 253   Cardiac Enzymes: No results for input(s): CKTOTAL, CKMB, CKMBINDEX, TROPONINI in the last 168 hours. BNP: Invalid input(s): POCBNP CBG: No results for input(s): GLUCAP in the last 168 hours. D-Dimer No results for input(s): DDIMER in the last 72 hours. Hgb A1c No results for input(s): HGBA1C in the last 72 hours. Lipid Profile No results for input(s): CHOL, HDL, LDLCALC, TRIG, CHOLHDL, LDLDIRECT in the last 72 hours. Thyroid function studies No results for input(s): TSH, T4TOTAL, T3FREE, THYROIDAB in the last 72 hours.  Invalid input(s): FREET3 Anemia work up No results for input(s): VITAMINB12, FOLATE, FERRITIN, TIBC, IRON, RETICCTPCT in the last 72 hours. Urinalysis    Component Value Date/Time   COLORURINE STRAW (A) 02/01/2017 0302   APPEARANCEUR CLEAR 02/01/2017 0302   LABSPEC 1.003 (L) 02/01/2017 0302   PHURINE 7.0 02/01/2017 0302   GLUCOSEU NEGATIVE 02/01/2017 0302   HGBUR SMALL (A) 02/01/2017 0302   BILIRUBINUR NEGATIVE 02/01/2017 0302   KETONESUR NEGATIVE 02/01/2017 0302   PROTEINUR NEGATIVE 02/01/2017 0302   UROBILINOGEN 0.2 09/20/2014 0058   NITRITE NEGATIVE 02/01/2017 0302   LEUKOCYTESUR NEGATIVE 02/01/2017 0302   Sepsis Labs Invalid input(s): PROCALCITONIN,  WBC,  LACTICIDVEN Microbiology No results found for this or any previous visit (from the past 240 hour(s)).   Time coordinating discharge: 45 minutes  SIGNED:   Coralie Keens, MD  [facility] 02/04/2017, 10:49 AM Pager   If 7PM-7AM, please contact night-coverage [email or web address] Password [email or web address]

## 2017-02-04 NOTE — Care Management Note (Signed)
Case Management Note  Patient Details  Name: Edwin [name] MRN: [id number] Date of Birth: 11-Dec-1955  Subjective/Objective:    enteritis                Action/Plan:Date:  February 04, 2017 Chart reviewed for concurrent status and case management needs. Will continue to follow patient progress. Discharge Planning: following for needs Expected discharge date: [id number] Marcelle Smiling, BSN, [place], [place]   [phone]   Expected Discharge Date:  02/04/17               Expected Discharge Plan:  Home/Self Care  In-House Referral:     Discharge planning Services     Post Acute Care Choice:    Choice offered to:     DME Arranged:    DME Agency:     HH Arranged:    HH Agency:     Status of Service:  Completed, signed off  If discussed at [employer] of Stay Meetings, dates discussed:    Additional Comments:  Golda Acre, RN 02/04/2017, 11:33 AM

## 2017-02-06 DIAGNOSIS — M25649 Stiffness of unspecified hand, not elsewhere classified: Secondary | ICD-10-CM | POA: Diagnosis not present

## 2017-02-06 DIAGNOSIS — M20012 Mallet finger of left finger(s): Secondary | ICD-10-CM | POA: Diagnosis not present

## 2017-02-11 ENCOUNTER — Encounter: Payer: Self-pay | Admitting: Cardiology

## 2017-02-11 ENCOUNTER — Ambulatory Visit (INDEPENDENT_AMBULATORY_CARE_PROVIDER_SITE_OTHER): Payer: BLUE CROSS/BLUE SHIELD | Admitting: Cardiology

## 2017-02-11 VITALS — BP 144/94 | HR 84 | Ht 71.0 in | Wt 226.4 lb

## 2017-02-11 DIAGNOSIS — I10 Essential (primary) hypertension: Secondary | ICD-10-CM

## 2017-02-11 DIAGNOSIS — I4589 Other specified conduction disorders: Secondary | ICD-10-CM

## 2017-02-11 DIAGNOSIS — K567 Ileus, unspecified: Secondary | ICD-10-CM | POA: Diagnosis not present

## 2017-02-11 NOTE — Patient Instructions (Signed)

## 2017-02-11 NOTE — Progress Notes (Signed)
[address] [place], [place]  [address] Phone: [phone] Fax:  [phone]  Date:  02/11/2017   ID:  Edwin [name], DOB [birth year], MRN [id number]  PCP:  [name], MD   History of Present Illness: Edwin [name] is a 61 y.o. 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 Diltiazem, which was used for high blood pressure, was discontinued and he continued to have slow heart rates. A cardiac catheterization was performed in 2012 to exclude ischemic origin for his A-V dissociation. This was reassuring. EF was normal.  Currently, he is feeling very well on Depakote. He feels more spontaneous. He feels better than he did on the lithium. I'm very pleased with this. Dr. Jennelle Human must be also.  He's not having any dizziness, syncope. His heart rate today is in the 50-70's..  Denies any syncope, bleeding, chest pain, orthopnea, shortness of breath  02/01/16 -during piano lessions feels SOB random. ?anxiety. No syncope.   02/12/16  - No SOB, no syncope. [employer], doubled. Mother Allyne Gee, CVA years ago. Was in hospital in 02/04/17 with ileus. Prior appendectomy. Overall he is just resuming his medications. Feels much better. No surgery.  Wt Readings from Last 3 Encounters:  02/11/17 226 lb 6.4 oz (102.7 kg)  02/01/17 220 lb (99.8 kg)  05/19/16 219 lb 8 oz (99.6 kg)     Past Medical History:  Diagnosis Date  . Allergic rhinitis   . Bipolar disorder (HCC)   . 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 CKD (chronic kidney disease) stage 2, GFR 60-89 ml/min   . Gout   . Hypertension   . Mild renal insufficiency    , with creatinine of 1.3 in 2010, was side effect of med  . Obesity    ,moderate  . Prostatitis    , Episodic  . Suicide attempt 09/20/2014    Past Surgical History:  Procedure Laterality Date  . APPENDECTOMY    . CATARACT EXTRACTION Right   . COLONOSCOPY WITH  PROPOFOL N/A 06/14/2014   Procedure: COLONOSCOPY WITH PROPOFOL;  Surgeon: Charolett Bumpers, MD;  Location: [facility];  Service: Endoscopy;  Laterality: N/A;  . TONSILLECTOMY    . UMBILICAL HERNIA REPAIR      Current Outpatient Prescriptions  Medication Sig Dispense Refill  . amLODipine (NORVASC) 2.5 MG tablet Take 2.5 mg by mouth daily.   10  . cholecalciferol (VITAMIN D) 1000 UNITS tablet Take 2 tablets (2,000 Units total) by mouth daily. 60 tablet 0  . divalproex (DEPAKOTE ER) 500 MG 24 hr tablet Take 2 tablets (1,000 mg total) by mouth at bedtime. Take total of 1,000 mg total at bedtime. 60 tablet 0  . DULoxetine (CYMBALTA) 60 MG capsule Take 2 capsules (120 mg total) by mouth at bedtime. 60 capsule 0  . febuxostat (ULORIC) 40 MG tablet Take 40 mg by mouth daily.    [name] lamoTRIgine (LAMICTAL) 200 MG tablet Take 200 mg by mouth daily.   0  . LevOCARNitine (L-CARNITINE) 500 MG TABS Take 500 mg by mouth 2 (two) times daily.    [name] losartan (COZAAR) 50 MG tablet Take 1 tablet (50 mg total) by mouth daily. 30 tablet 0  . polyethylene glycol (MIRALAX) packet Take 17 g by mouth daily. 14 each 0  . pramipexole (MIRAPEX) 0.5 MG tablet Take 0.5 mg by mouth 2 (two) times daily.  No current facility-administered medications for this visit.     Allergies:    Allergies  Allergen Reactions  . Allopurinol Other (See Comments)    Reaction:  Made pt unstable   . Aspirin Hives  . Penicillins Hives and Other (See Comments)    Has patient had a PCN reaction causing immediate rash, facial/tongue/throat swelling, SOB or lightheadedness with hypotension: No Has patient had a PCN reaction causing severe rash involving mucus membranes or skin necrosis: No Has patient had a PCN reaction that required hospitalization No Has patient had a PCN reaction occurring within the last 10 years: No If all of the above answers are "NO", then may proceed with Cephalosporin use.  . Ciprofloxacin Rash    Social  History:  The patient  reports that he has never smoked. He has never used smokeless tobacco. He reports that he does not drink alcohol or use drugs.   ROS:  Please see the history of present illness.   No syncope, no bleeding, no orthopnea, no PND    PHYSICAL EXAM: VS:  BP (!) 144/94   Pulse 84   Ht 5\' 11"  (1.803 m)   Wt 226 lb 6.4 oz (102.7 kg)   BMI 31.58 kg/m  Well nourished, well developed, in no acute distress  HEENT: normal  Neck: no JVD  Cardiac:  Regular rate and rhythm, no murmurs  Lungs:  Clear all of to auscultation bilaterally, no wheezing, rhonchi or rales  Abd: soft, nontender, no hepatomegaly  Ext: trace B edema, left middle finger splint (tendon rupture).  Skin: warm and dry  Neuro: no focal abnormalities noted  EKG:  Today 02/11/17-sinus rhythm 84 with vertical axis, nonspecific ST-T wave changes personally viewed no significant change from prior- EKG ordered today. 02/01/16-sinus bradycardia 55, vertical axis, no change from prior. Personally viewed. Heart rate slightly increased. 11/26/14-sinus bradycardia rate 58, subtle nonspecific ST changes, vertical axis-prior Sinus bradycardia with sinus arrhythmia, heart rate 48 beats per minute. Possible old septal infarct pattern.     ASSESSMENT AND PLAN:  Previous A-V dissociation.   - Doing well. Asymptomatic bradycardia. No changes made in medications. He is feeling much better. Has not had any further occurrences. No adverse arrhythmias during hospitalization.  Hypertension  - mildly elevated now but just restarting meds after ileus. Doing well on current medications. No change.   Ileus  - Dr. Kevan Ny will continue to monitor. Hospital records reviewed. Good recovery.  One-year follow-up, he would like to continue to maintain visits given his prior electrical abnormality.  Signed, Donato Schultz, MD [facility]  02/11/2017 10:15 AM

## 2017-02-12 DIAGNOSIS — F3181 Bipolar II disorder: Secondary | ICD-10-CM | POA: Diagnosis not present

## 2017-02-25 DIAGNOSIS — Z8719 Personal history of other diseases of the digestive system: Secondary | ICD-10-CM | POA: Diagnosis not present

## 2017-02-27 DIAGNOSIS — M20012 Mallet finger of left finger(s): Secondary | ICD-10-CM | POA: Diagnosis not present

## 2017-03-06 DIAGNOSIS — F3181 Bipolar II disorder: Secondary | ICD-10-CM | POA: Diagnosis not present

## 2017-03-19 DIAGNOSIS — F3181 Bipolar II disorder: Secondary | ICD-10-CM | POA: Diagnosis not present

## 2017-04-08 DIAGNOSIS — H353131 Nonexudative age-related macular degeneration, bilateral, early dry stage: Secondary | ICD-10-CM | POA: Diagnosis not present

## 2017-04-08 DIAGNOSIS — H5213 Myopia, bilateral: Secondary | ICD-10-CM | POA: Diagnosis not present

## 2017-04-10 DIAGNOSIS — M20012 Mallet finger of left finger(s): Secondary | ICD-10-CM | POA: Diagnosis not present

## 2017-04-29 DIAGNOSIS — F3181 Bipolar II disorder: Secondary | ICD-10-CM | POA: Diagnosis not present

## 2017-04-30 DIAGNOSIS — K565 Intestinal adhesions [bands], unspecified as to partial versus complete obstruction: Secondary | ICD-10-CM | POA: Diagnosis not present

## 2017-05-06 DIAGNOSIS — K565 Intestinal adhesions [bands], unspecified as to partial versus complete obstruction: Secondary | ICD-10-CM | POA: Diagnosis not present

## 2017-05-07 ENCOUNTER — Other Ambulatory Visit: Payer: Self-pay | Admitting: Surgery

## 2017-05-07 DIAGNOSIS — K565 Intestinal adhesions [bands], unspecified as to partial versus complete obstruction: Secondary | ICD-10-CM

## 2017-05-17 ENCOUNTER — Ambulatory Visit
Admission: RE | Admit: 2017-05-17 | Discharge: 2017-05-17 | Disposition: A | Discharge status: Home / Self Care | Payer: BLUE CROSS/BLUE SHIELD | Source: Ambulatory Visit | Attending: Surgery | Admitting: Surgery

## 2017-05-17 DIAGNOSIS — K56609 Unspecified intestinal obstruction, unspecified as to partial versus complete obstruction: Secondary | ICD-10-CM | POA: Diagnosis not present

## 2017-05-17 DIAGNOSIS — K565 Intestinal adhesions [bands], unspecified as to partial versus complete obstruction: Secondary | ICD-10-CM

## 2017-05-17 IMAGING — DX DG SMALL BOWEL
4 series · 14 of 14 positions shown · non-contrast
Comparison: CT [DATE].  Radiographs [DATE].

CLINICAL DATA: Recurrent partial small bowel obstruction. Suspected
adhesions. Previous appendectomy and hernia repair.

EXAM:
SMALL BOWEL SERIES
TECHNIQUE: Following ingestion of thin barium, serial small bowel images were
obtained including spot views of the terminal ileum.
FLUOROSCOPY TIME:  Fluoroscopy Time:  1 minutes and 18 seconds
Radiation Exposure Index (if provided by the fluoroscopic device):
253 mGy
Number of Acquired Spot Images: 5 overhead radiographs prior to
fluoroscopy.

[dg abd 1 view (1 of 3)]
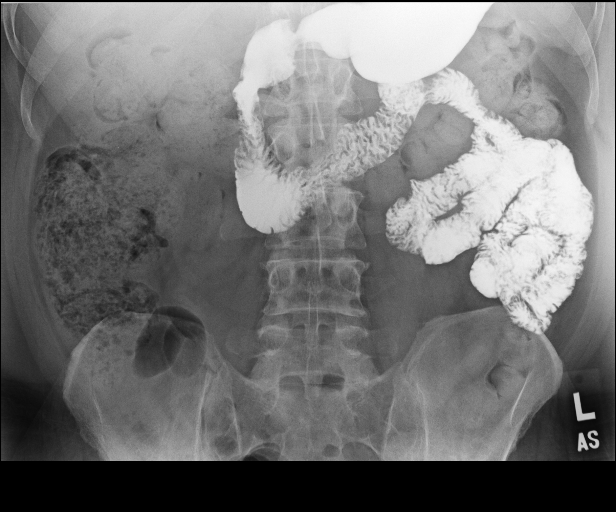

[Series 1: one shot · 11 of 11 slices shown]
[im 1/11]
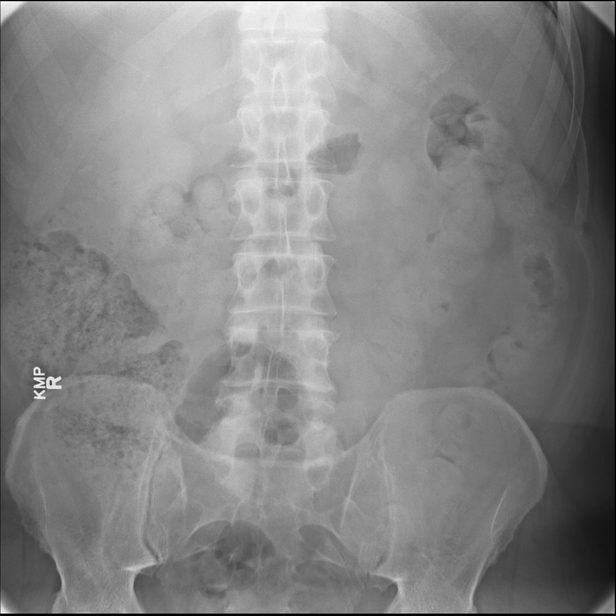
[im 2/11]
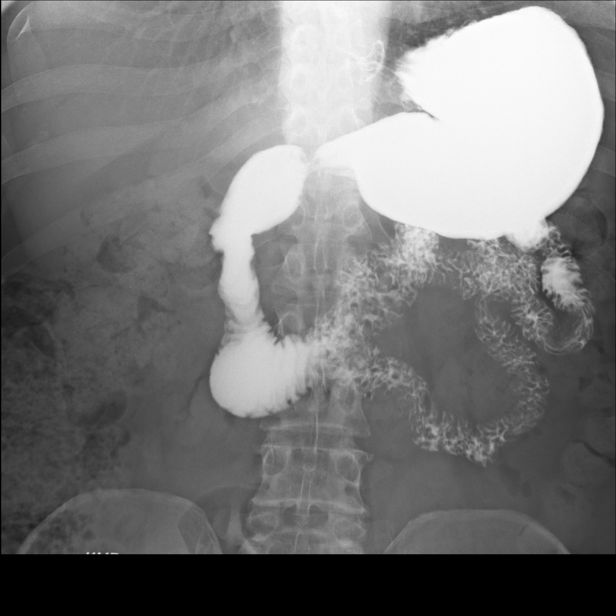
[im 3/11]
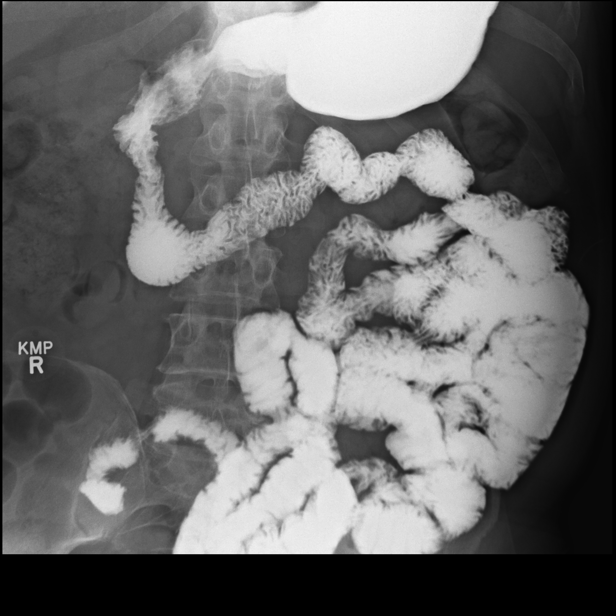
[im 4/11]
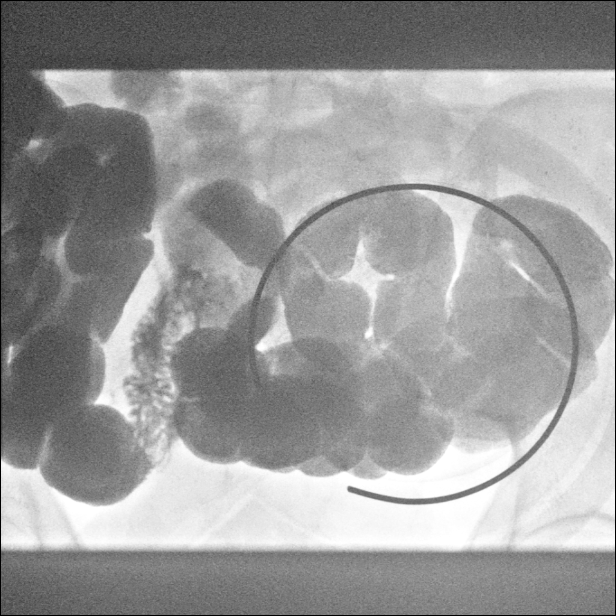
[im 5/11]
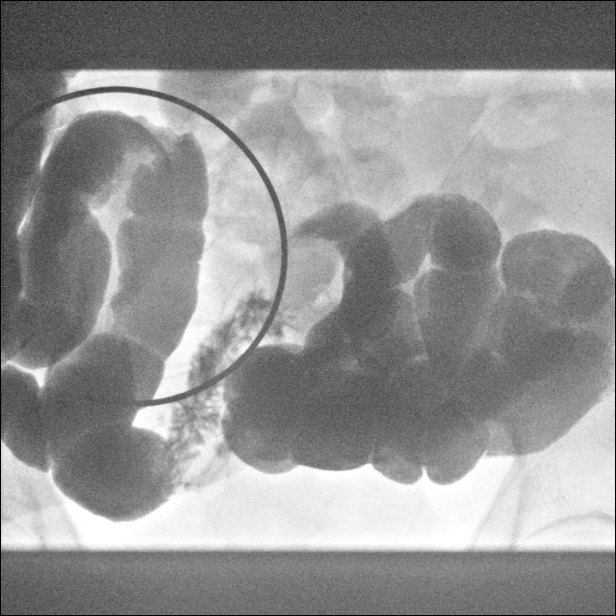
[im 6/11]
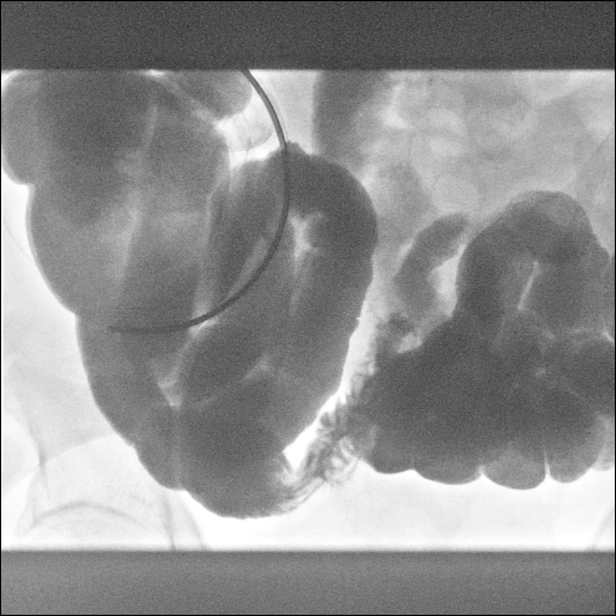
[im 7/11]
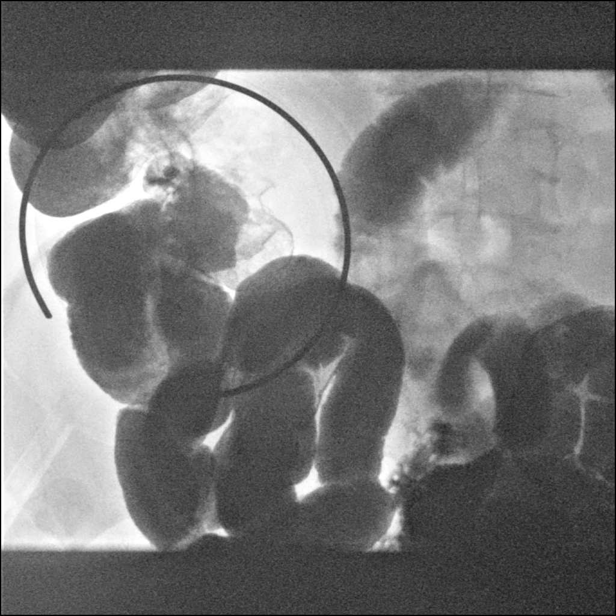
[im 8/11]
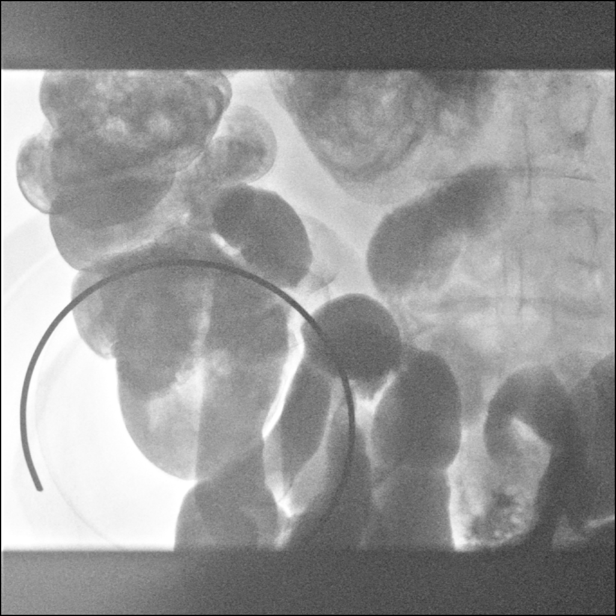
[im 9/11]
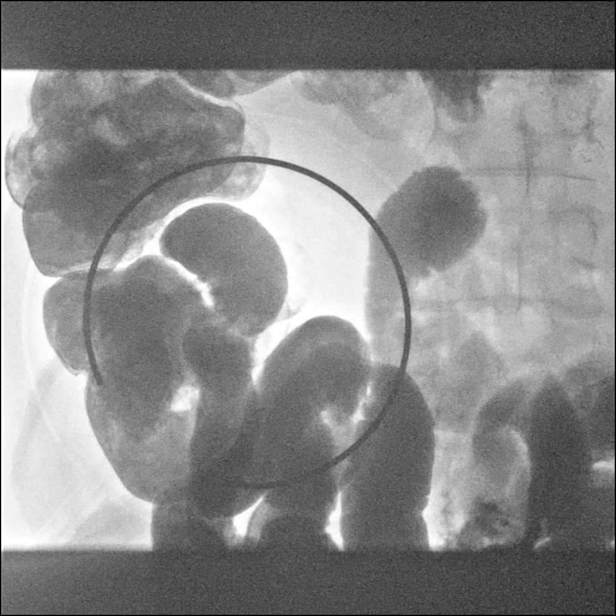
[im 10/11]
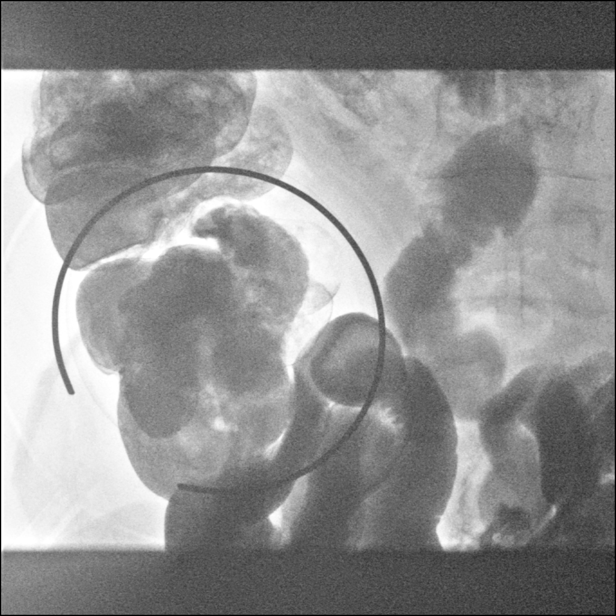
[im 11/11]
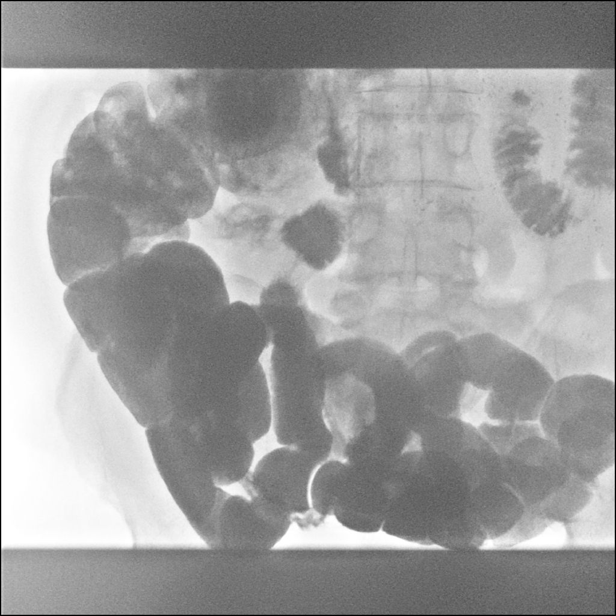

[dg abd 1 view (2 of 3)]
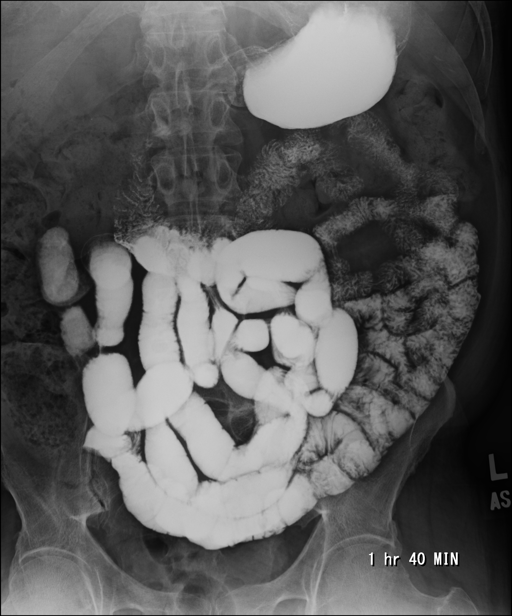

[dg abd 1 view (3 of 3)]
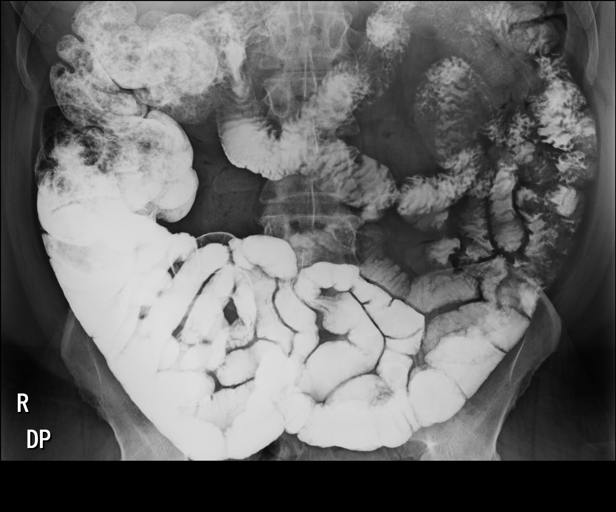

[14 of 14 positions shown; findings below may reference images not displayed]

FINDINGS: The scout abdominal radiograph demonstrates a moderate amount of
stool throughout the colon. No significant small bowel distension or
wall thickening identified. There are no worrisome abdominal
calcifications.

The small bowel transit is slightly delayed, taking just over 2
hours to reach the right colon. However, there is no significant
bowel dilatation or focal mucosal abnormality. At fluoroscopy, the
terminal ileum appears normal. No bowel angulation or other evidence
of adhesions seen.
IMPRESSION: No bowel distention, focal abnormality or adhesions identified.
There is mildly delayed small bowel transit.

## 2017-05-29 DIAGNOSIS — F3181 Bipolar II disorder: Secondary | ICD-10-CM | POA: Diagnosis not present

## 2017-06-11 DIAGNOSIS — K56600 Partial intestinal obstruction, unspecified as to cause: Secondary | ICD-10-CM | POA: Diagnosis not present

## 2017-06-28 DIAGNOSIS — M1009 Idiopathic gout, multiple sites: Secondary | ICD-10-CM | POA: Diagnosis not present

## 2017-06-28 DIAGNOSIS — Z79899 Other long term (current) drug therapy: Secondary | ICD-10-CM | POA: Diagnosis not present

## 2017-07-05 DIAGNOSIS — Y33XXXA Other specified events, undetermined intent, initial encounter: Secondary | ICD-10-CM | POA: Diagnosis not present

## 2017-07-05 DIAGNOSIS — W182XXA Fall in (into) shower or empty bathtub, initial encounter: Secondary | ICD-10-CM | POA: Diagnosis not present

## 2017-07-05 DIAGNOSIS — W010XXA Fall on same level from slipping, tripping and stumbling without subsequent striking against object, initial encounter: Secondary | ICD-10-CM | POA: Diagnosis not present

## 2017-07-05 DIAGNOSIS — M79672 Pain in left foot: Secondary | ICD-10-CM | POA: Diagnosis not present

## 2017-07-05 DIAGNOSIS — S99922A Unspecified injury of left foot, initial encounter: Secondary | ICD-10-CM | POA: Diagnosis not present

## 2017-07-05 DIAGNOSIS — I10 Essential (primary) hypertension: Secondary | ICD-10-CM | POA: Diagnosis not present

## 2017-07-05 DIAGNOSIS — S92355A Nondisplaced fracture of fifth metatarsal bone, left foot, initial encounter for closed fracture: Secondary | ICD-10-CM | POA: Diagnosis not present

## 2017-07-08 DIAGNOSIS — S92302A Fracture of unspecified metatarsal bone(s), left foot, initial encounter for closed fracture: Secondary | ICD-10-CM | POA: Diagnosis not present

## 2017-07-08 DIAGNOSIS — S99192A Other physeal fracture of left metatarsal, initial encounter for closed fracture: Secondary | ICD-10-CM | POA: Diagnosis not present

## 2017-07-10 DIAGNOSIS — I10 Essential (primary) hypertension: Secondary | ICD-10-CM | POA: Diagnosis not present

## 2017-07-10 DIAGNOSIS — M109 Gout, unspecified: Secondary | ICD-10-CM | POA: Diagnosis not present

## 2017-07-10 DIAGNOSIS — F329 Major depressive disorder, single episode, unspecified: Secondary | ICD-10-CM | POA: Diagnosis not present

## 2017-07-10 DIAGNOSIS — N183 Chronic kidney disease, stage 3 (moderate): Secondary | ICD-10-CM | POA: Diagnosis not present

## 2017-07-17 DIAGNOSIS — F3181 Bipolar II disorder: Secondary | ICD-10-CM | POA: Diagnosis not present

## 2017-08-05 DIAGNOSIS — L97522 Non-pressure chronic ulcer of other part of left foot with fat layer exposed: Secondary | ICD-10-CM | POA: Diagnosis not present

## 2017-08-05 DIAGNOSIS — S92302D Fracture of unspecified metatarsal bone(s), left foot, subsequent encounter for fracture with routine healing: Secondary | ICD-10-CM | POA: Diagnosis not present

## 2017-08-09 DIAGNOSIS — R600 Localized edema: Secondary | ICD-10-CM | POA: Diagnosis not present

## 2017-08-09 DIAGNOSIS — M109 Gout, unspecified: Secondary | ICD-10-CM | POA: Diagnosis not present

## 2017-08-14 DIAGNOSIS — F3181 Bipolar II disorder: Secondary | ICD-10-CM | POA: Diagnosis not present

## 2017-08-21 DIAGNOSIS — F3181 Bipolar II disorder: Secondary | ICD-10-CM | POA: Diagnosis not present

## 2017-09-02 ENCOUNTER — Other Ambulatory Visit: Payer: Self-pay | Admitting: Internal Medicine

## 2017-09-02 DIAGNOSIS — Z23 Encounter for immunization: Secondary | ICD-10-CM | POA: Diagnosis not present

## 2017-09-02 DIAGNOSIS — R600 Localized edema: Secondary | ICD-10-CM

## 2017-09-02 DIAGNOSIS — S99192A Other physeal fracture of left metatarsal, initial encounter for closed fracture: Secondary | ICD-10-CM | POA: Diagnosis not present

## 2017-09-02 DIAGNOSIS — N183 Chronic kidney disease, stage 3 (moderate): Secondary | ICD-10-CM | POA: Diagnosis not present

## 2017-09-02 DIAGNOSIS — M109 Gout, unspecified: Secondary | ICD-10-CM | POA: Diagnosis not present

## 2017-09-02 DIAGNOSIS — I10 Essential (primary) hypertension: Secondary | ICD-10-CM | POA: Diagnosis not present

## 2017-09-02 DIAGNOSIS — I872 Venous insufficiency (chronic) (peripheral): Secondary | ICD-10-CM

## 2017-09-03 ENCOUNTER — Ambulatory Visit
Admission: RE | Admit: 2017-09-03 | Discharge: 2017-09-03 | Disposition: A | Discharge status: Home / Self Care | Payer: BLUE CROSS/BLUE SHIELD | Source: Ambulatory Visit | Attending: Internal Medicine | Admitting: Internal Medicine

## 2017-09-03 DIAGNOSIS — R600 Localized edema: Secondary | ICD-10-CM

## 2017-09-06 DIAGNOSIS — S92302D Fracture of unspecified metatarsal bone(s), left foot, subsequent encounter for fracture with routine healing: Secondary | ICD-10-CM | POA: Diagnosis not present

## 2017-09-11 ENCOUNTER — Ambulatory Visit
Admission: RE | Admit: 2017-09-11 | Discharge: 2017-09-11 | Disposition: A | Discharge status: Home / Self Care | Payer: BLUE CROSS/BLUE SHIELD | Source: Ambulatory Visit | Attending: Internal Medicine | Admitting: Internal Medicine

## 2017-09-11 DIAGNOSIS — M7989 Other specified soft tissue disorders: Secondary | ICD-10-CM | POA: Diagnosis not present

## 2017-09-11 DIAGNOSIS — S92302S Fracture of unspecified metatarsal bone(s), left foot, sequela: Secondary | ICD-10-CM | POA: Diagnosis not present

## 2017-09-11 DIAGNOSIS — R600 Localized edema: Secondary | ICD-10-CM | POA: Diagnosis not present

## 2017-09-11 DIAGNOSIS — I872 Venous insufficiency (chronic) (peripheral): Secondary | ICD-10-CM

## 2017-09-11 NOTE — Consult Note (Signed)
Chief Complaint: Patient was seen in consultation today for lower extremity edema at the request of Gates,Robert  Referring Physician(s): Gates,Robert  History of Present Illness: Edwin [name] is a 61 y.o. male with bilateral lower extremity edema. Lower extremity edema was recently identified a few months ago when he presented with a left foot metatarsal fracture. The patient does not have symptoms from the lower extremity edema and he does not complain of leg pain. He denies any significant lower extremity trauma except for the recent left foot metatarsal fracture. No history of prior lower extremity DVTs. His past medical history is significant for bipolar disorder, gout and moderate kidney disease. According to the patient, he lives a relatively sedentary lifestyle. He is a Sport and exercise psychologist. He says that he is often sitting in a chair for multiple hours during the day. He rarely exercises. Patient had a lower extremity venous duplex on 09/03/2017 that was negative for DVT.  Past Medical History:  Diagnosis Date  . Allergic rhinitis   . Bipolar disorder (HCC)   . 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 CKD (chronic kidney disease) stage 2, GFR 60-89 ml/min   . Gout   . Hypertension   . Mild renal insufficiency    , with creatinine of 1.3 in 2010, was side effect of med  . Obesity    ,moderate  . Prostatitis    , Episodic  . Suicide attempt (HCC) 09/20/2014    Past Surgical History:  Procedure Laterality Date  . APPENDECTOMY    . CATARACT EXTRACTION Right   . COLONOSCOPY WITH PROPOFOL N/A 06/14/2014   Procedure: COLONOSCOPY WITH PROPOFOL;  Surgeon: Charolett Bumpers, MD;  Location: [facility];  Service: Endoscopy;  Laterality: N/A;  . TONSILLECTOMY    . UMBILICAL HERNIA REPAIR      Allergies: Allopurinol; Penicillins; Aspirin; and Ciprofloxacin  Medications: Prior to Admission medications   Medication Sig Start Date End Date Taking?  Authorizing Provider  cholecalciferol (VITAMIN D) 1000 UNITS tablet Take 2 tablets (2,000 Units total) by mouth daily. 09/23/14  Yes Adonis Brook, NP  divalproex (DEPAKOTE ER) 500 MG 24 hr tablet Take 2 tablets (1,000 mg total) by mouth at bedtime. Take total of 1,000 mg total at bedtime. 09/23/14  Yes Adonis Brook, NP  DULoxetine (CYMBALTA) 60 MG capsule Take 2 capsules (120 mg total) by mouth at bedtime. 09/23/14  Yes Adonis Brook, NP  febuxostat (ULORIC) 40 MG tablet Take 40 mg by mouth daily.   Yes [provider]  lamoTRIgine (LAMICTAL) 200 MG tablet Take 200 mg by mouth daily.    Yes [provider]  LevOCARNitine (L-CARNITINE) 500 MG TABS Take 500 mg by mouth 2 (two) times daily.   Yes [provider]  losartan (COZAAR) 50 MG tablet Take 1 tablet (50 mg total) by mouth daily. 09/23/14  Yes Adonis Brook, NP  polyethylene glycol [facility]) packet Take 17 g by mouth daily. 02/04/17  Yes Arrien, York Ram, MD  pramipexole (MIRAPEX) 0.5 MG tablet Take 0.5 mg by mouth 2 (two) times daily.   Yes [provider]     Family History  Problem Relation Age of Onset  . Hypertension Father   . Gout Father   . Alzheimer's disease Father   . Other Mother        Viral Meningitis  . Mitral valve prolapse Mother   . Arrhythmia Mother   . Hypothyroidism Mother  Social History   Social History  . Marital status: Single    Spouse name: N/A  . Number of children: N/A  . Years of education: N/A   Social History Main Topics  . Smoking status: Never Smoker  . Smokeless tobacco: Never Used  . Alcohol use No  . Drug use: No  . Sexual activity: Not Asked   Other Topics Concern  . None   Social History Narrative  . None     Review of Systems  Constitutional: Negative for activity change and fatigue.  Cardiovascular: Positive for leg swelling.  Skin: Positive for wound.       He developed a small blister along the lateral left foot with  the recent fracture.     Vital Signs: BP (!) 148/95 (BP Location: Left Arm, Patient Position: Sitting, Cuff Size: Normal)   Pulse 74   Temp 98 F (36.7 C)   Resp 18   Ht 5\' 10"  (1.778 m)   Wt 235 lb (106.6 kg)   SpO2 100%   BMI 33.72 kg/m   Physical Exam  Constitutional: He is oriented to person, place, and time. No distress.  Cardiovascular: Intact distal pulses.   Musculoskeletal: He exhibits edema. He exhibits no tenderness.  Mild edema in both calves and ankles, left side greater than right.  Round purple area along the lateral left foot associated with an old blister. Mild swelling along the lateral left foot.  No varicosities. No open ulcers.  Neurological: He is alert and oriented to person, place, and time.       Imaging: US Venous Img Lower Bilateral  Result Date: 09/11/2017 CLINICAL DATA:  61 year old with bilateral lower extremity swelling. Evaluate for venous insufficiency. Recent lower extremity venous duplex was negative for DVT. EXAM: BILATERAL LOWER EXTREMITY VENOUS DUPLEX ULTRASOUND TECHNIQUE: Gray-scale sonography was performed to evaluate the superficial veins of both lower extremities. A complete superficial venous insufficiency exam was performed in the upright standing position. I personally performed the technical portion of the exam. COMPARISON:  Bilateral lower extremity venous duplex examination on 09/03/2017. FINDINGS: Right lower extremity: The right saphenofemoral junction is patent. Normal caliber of the right great saphenous vein without reflux. No varicosities. Small right short saphenous vein without reflux. Left lower extremity: The left saphenofemoral junction is patent. Normal caliber of the left great saphenous vein without reflux. Small left short saphenous vein without reflux. No varicosities. Subcutaneous edema in the lower calves bilaterally. IMPRESSION: Negative superficial venous examination. No evidence for superficial venous insufficiency  or reflux within the bilateral lower extremity saphenous veins. Electronically Signed   By: Richarda Overlie M.D.   On: 09/11/2017 12:14   US Venous Img Lower Bilateral  Result Date: 09/03/2017 CLINICAL DATA:  Bilateral lower extremity edema. EXAM: BILATERAL LOWER EXTREMITY VENOUS DOPPLER ULTRASOUND TECHNIQUE: Gray-scale sonography with graded compression, as well as color Doppler and duplex ultrasound were performed to evaluate the lower extremity deep venous systems from the level of the common femoral vein and including the common femoral, femoral, profunda femoral, popliteal and calf veins including the posterior tibial, peroneal and gastrocnemius veins when visible. The superficial great saphenous vein was also interrogated. Spectral Doppler was utilized to evaluate flow at rest and with distal augmentation maneuvers in the common femoral, femoral and popliteal veins. COMPARISON:  None. FINDINGS: RIGHT LOWER EXTREMITY Common Femoral Vein: No evidence of thrombus. Normal compressibility, respiratory phasicity and response to augmentation. Saphenofemoral Junction: No evidence of thrombus. Normal compressibility and flow on color Doppler  imaging. Profunda Femoral Vein: No evidence of thrombus. Normal compressibility and flow on color Doppler imaging. Femoral Vein: No evidence of thrombus. Normal compressibility, respiratory phasicity and response to augmentation. Popliteal Vein: No evidence of thrombus. Normal compressibility, respiratory phasicity and response to augmentation. Calf Veins: No evidence of thrombus. Normal compressibility and flow on color Doppler imaging. Superficial Great Saphenous Vein: No evidence of thrombus. Normal compressibility. Venous Reflux:  None. Other Findings: No evidence of superficial thrombophlebitis or abnormal fluid collection. LEFT LOWER EXTREMITY Common Femoral Vein: No evidence of thrombus. Normal compressibility, respiratory phasicity and response to augmentation.  Saphenofemoral Junction: No evidence of thrombus. Normal compressibility and flow on color Doppler imaging. Profunda Femoral Vein: No evidence of thrombus. Normal compressibility and flow on color Doppler imaging. Femoral Vein: No evidence of thrombus. Normal compressibility, respiratory phasicity and response to augmentation. Popliteal Vein: No evidence of thrombus. Normal compressibility, respiratory phasicity and response to augmentation. Calf Veins: No evidence of thrombus. Normal compressibility and flow on color Doppler imaging. Superficial Great Saphenous Vein: No evidence of thrombus. Normal compressibility. Venous Reflux:  None. Other Findings: No evidence of superficial thrombophlebitis or abnormal fluid collection. IMPRESSION: No evidence of bilateral lower extremity deep venous thrombosis. Electronically Signed   By: Irish LackGlenn  Yamagata M.D.   On: 09/03/2017 12:17   [place] Rad Eval And Mgmt  Result Date: 09/11/2017 Please refer to "Notes" to see consult details.   Labs:  CBC:  Recent Labs  02/01/17 0205 02/03/17 0527  WBC 10.5 7.4  HGB 13.2 10.8*  HCT 37.9* 31.1*  PLT 333 253    COAGS: No results for input(s): INR, APTT in the last 8760 hours.  BMP:  Recent Labs  02/01/17 0152 02/02/17 0819 02/03/17 0527 02/04/17 0525  NA 134* 141 141 140  K 3.1* 3.9 3.5 4.1  CL 101 108 108 105  CO2 22 24 26 27   GLUCOSE 146* 97 112* 120*  BUN 15 13 10 10   CALCIUM 8.8* 8.8* 8.9 9.2  CREATININE 1.21 1.17 1.05 1.07  GFRNONAA >60 >60 >60 >60  GFRAA >60 >60 >60 >60    LIVER FUNCTION TESTS:  Recent Labs  02/01/17 0152  BILITOT 0.6  AST 21  ALT 27  ALKPHOS 56  PROT 6.9  ALBUMIN 3.8    TUMOR MARKERS: No results for input(s): AFPTM, CEA, CA199, CHROMGRNA in the last 8760 hours.  Assessment and Plan:  Bilateral lower extremity edema without superficial venous insufficiency.  No evidence for DVT on the recent study and no evidence for venous insufficiency at today's  examination. In addition, no significant varicosities identified.   Fortunately, the patient is not symptomatic from the lower extremity edema. Recommend knee-high compression stockings, particularly when he is working and sedentary. Recommended 8-15 mmHg knee-high stockings. I gave the patient educational material about where he can get these stockings. Patient will follow-up as needed.  Thank you for this interesting consult.  I greatly enjoyed meeting Francetta FoundMichael B Nilson and look forward to participating in their care.  A copy of this report was sent to the requesting provider on this date.  Electronically Signed: Abundio MiuHENN, Jalexa Pifer RYAN 09/11/2017, 12:29 PM   I spent a total of  15 Minutes   in face to face in clinical consultation, greater than 50% of which was counseling/coordinating care for lower extremity edema.

## 2017-09-24 DIAGNOSIS — K56609 Unspecified intestinal obstruction, unspecified as to partial versus complete obstruction: Secondary | ICD-10-CM | POA: Diagnosis not present

## 2017-10-03 DIAGNOSIS — I10 Essential (primary) hypertension: Secondary | ICD-10-CM | POA: Diagnosis not present

## 2017-10-03 DIAGNOSIS — M109 Gout, unspecified: Secondary | ICD-10-CM | POA: Diagnosis not present

## 2017-10-03 DIAGNOSIS — R600 Localized edema: Secondary | ICD-10-CM | POA: Diagnosis not present

## 2017-10-03 DIAGNOSIS — S99192D Other physeal fracture of left metatarsal, subsequent encounter for fracture with routine healing: Secondary | ICD-10-CM | POA: Diagnosis not present

## 2017-10-30 DIAGNOSIS — L291 Pruritus scroti: Secondary | ICD-10-CM | POA: Diagnosis not present

## 2017-10-30 DIAGNOSIS — D1801 Hemangioma of skin and subcutaneous tissue: Secondary | ICD-10-CM | POA: Diagnosis not present

## 2017-10-30 DIAGNOSIS — L821 Other seborrheic keratosis: Secondary | ICD-10-CM | POA: Diagnosis not present

## 2017-10-30 DIAGNOSIS — D225 Melanocytic nevi of trunk: Secondary | ICD-10-CM | POA: Diagnosis not present

## 2017-11-15 DIAGNOSIS — N529 Male erectile dysfunction, unspecified: Secondary | ICD-10-CM | POA: Diagnosis not present

## 2017-11-20 DIAGNOSIS — R0681 Apnea, not elsewhere classified: Secondary | ICD-10-CM | POA: Diagnosis not present

## 2017-11-25 DIAGNOSIS — F3181 Bipolar II disorder: Secondary | ICD-10-CM | POA: Diagnosis not present

## 2017-11-27 DIAGNOSIS — N529 Male erectile dysfunction, unspecified: Secondary | ICD-10-CM | POA: Diagnosis not present

## 2017-12-11 DIAGNOSIS — E291 Testicular hypofunction: Secondary | ICD-10-CM | POA: Diagnosis not present

## 2017-12-25 DIAGNOSIS — E291 Testicular hypofunction: Secondary | ICD-10-CM | POA: Diagnosis not present

## 2018-01-13 DIAGNOSIS — Z79899 Other long term (current) drug therapy: Secondary | ICD-10-CM | POA: Diagnosis not present

## 2018-01-13 DIAGNOSIS — E782 Mixed hyperlipidemia: Secondary | ICD-10-CM | POA: Diagnosis not present

## 2018-01-13 DIAGNOSIS — F3170 Bipolar disorder, currently in remission, most recent episode unspecified: Secondary | ICD-10-CM | POA: Diagnosis not present

## 2018-01-13 DIAGNOSIS — E291 Testicular hypofunction: Secondary | ICD-10-CM | POA: Diagnosis not present

## 2018-01-13 DIAGNOSIS — R972 Elevated prostate specific antigen [PSA]: Secondary | ICD-10-CM | POA: Diagnosis not present

## 2018-01-13 DIAGNOSIS — Z0000 Encounter for general adult medical examination without abnormal findings: Secondary | ICD-10-CM | POA: Diagnosis not present

## 2018-01-13 DIAGNOSIS — I10 Essential (primary) hypertension: Secondary | ICD-10-CM | POA: Diagnosis not present

## 2018-01-13 DIAGNOSIS — E559 Vitamin D deficiency, unspecified: Secondary | ICD-10-CM | POA: Diagnosis not present

## 2018-01-27 DIAGNOSIS — E291 Testicular hypofunction: Secondary | ICD-10-CM | POA: Diagnosis not present

## 2018-02-03 DIAGNOSIS — R0681 Apnea, not elsewhere classified: Secondary | ICD-10-CM | POA: Diagnosis not present

## 2018-02-06 ENCOUNTER — Encounter: Payer: Self-pay | Admitting: Cardiology

## 2018-02-06 ENCOUNTER — Ambulatory Visit: Payer: BLUE CROSS/BLUE SHIELD | Admitting: Cardiology

## 2018-02-06 VITALS — BP 126/84 | HR 72 | Ht 71.0 in | Wt 247.8 lb

## 2018-02-06 DIAGNOSIS — I10 Essential (primary) hypertension: Secondary | ICD-10-CM

## 2018-02-06 DIAGNOSIS — I4589 Other specified conduction disorders: Secondary | ICD-10-CM

## 2018-02-06 NOTE — Progress Notes (Signed)
[address] [place], [place]  [address] Phone: [phone] Fax:  [phone]  Date:  02/06/2018   ID:  Edwin [name], DOB 17-Mar-1956, MRN [id number]  PCP:  Marden Noble, MD   History of Present Illness: Edwin [name] is a 62 y.o. 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 Diltiazem, which was used for high blood pressure, was discontinued and he continued to have slow heart rates. A cardiac catheterization was performed in 2012 to exclude ischemic origin for his A-V dissociation. This was reassuring. EF was normal.  Currently, he is feeling very well on Depakote. He feels more spontaneous. He feels better than he did on the lithium. I'm very pleased with this. Dr. Jennelle Human must be also.  He's not having any dizziness, syncope. His heart rate today is in the 50-70's..  Denies any syncope, bleeding, chest pain, orthopnea, shortness of breath  02/01/16 -during piano lessions feels SOB random. ?anxiety. No syncope.   02/12/16  - No SOB, no syncope. [employer], doubled. Mother Allyne Gee, CVA years ago. Was in hospital in 02/04/17 with ileus. Prior appendectomy. Overall he is just resuming his medications. Feels much better. No surgery.  02/06/18  - Has gained more weight. No CP, no SHOB. No syncope, no bleeding. With his piano lessons, he "lives on [place] time": He says Mirapex is leading to gain of weight.   Wt Readings from Last 3 Encounters:  02/06/18 247 lb 12.8 oz (112.4 kg)  09/11/17 235 lb (106.6 kg)  02/11/17 226 lb 6.4 oz (102.7 kg)     Past Medical History:  Diagnosis Date  . Allergic rhinitis   . Bipolar disorder (HCC)   . 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 CKD (chronic kidney disease) stage 2, GFR 60-89 ml/min   . Gout   . Hypertension   . Mild renal insufficiency    , with creatinine of 1.3 in 2010, was side effect of med  . Obesity    ,moderate  .  Prostatitis    , Episodic  . Suicide attempt (HCC) 09/20/2014    Past Surgical History:  Procedure Laterality Date  . APPENDECTOMY    . CATARACT EXTRACTION Right   . COLONOSCOPY WITH PROPOFOL N/A 06/14/2014   Procedure: COLONOSCOPY WITH PROPOFOL;  Surgeon: Charolett Bumpers, MD;  Location: [facility];  Service: Endoscopy;  Laterality: N/A;  . TONSILLECTOMY    . UMBILICAL HERNIA REPAIR      Current Outpatient Medications  Medication Sig Dispense Refill  . amLODipine (NORVASC) 2.5 MG tablet Take 1 tablet by mouth daily.  10  . Cholecalciferol 1000 units TBDP Take 1 tablet by mouth daily.    . divalproex (DEPAKOTE ER) 500 MG 24 hr tablet Take 2 tablets (1,000 mg total) by mouth at bedtime. Take total of 1,000 mg total at bedtime. 60 tablet 0  . DULoxetine (CYMBALTA) 60 MG capsule Take 2 capsules (120 mg total) by mouth at bedtime. 60 capsule 0  . febuxostat (ULORIC) 40 MG tablet Take 40 mg by mouth daily.    [name] lamoTRIgine (LAMICTAL) 200 MG tablet Take 200 mg by mouth daily.   0  . LevOCARNitine (L-CARNITINE) 500 MG TABS Take 500 mg by mouth 2 (two) times daily.    [name] losartan (COZAAR) 50 MG tablet Take 1 tablet (50 mg total) by mouth daily. 30 tablet 0  . naltrexone (  DEPADE) 50 MG tablet Take 1 tablet by mouth daily.  1  . polyethylene glycol (MIRALAX) packet Take 17 g by mouth daily. 14 each 0  . pramipexole (MIRAPEX) 0.5 MG tablet Take 0.5 mg by mouth 2 (two) times daily.    [name] topiramate (TOPAMAX) 25 MG tablet Take 1 tablet by mouth daily.  4   No current facility-administered medications for this visit.     Allergies:    Allergies  Allergen Reactions  . Allopurinol Other (See Comments)    Made pt emotionally unstable   . Penicillins Hives and Other (See Comments)    Has patient had a PCN reaction causing immediate rash, facial/tongue/throat swelling, SOB or lightheadedness with hypotension: No Has patient had a PCN reaction causing severe rash involving mucus membranes or skin  necrosis: No Has patient had a PCN reaction that required hospitalization No Has patient had a PCN reaction occurring within the last 10 years: No If all of the above answers are "NO", then may proceed with Cephalosporin use.  . Aspirin Hives  . Ciprofloxacin Rash    Social History:  The patient  reports that he has never smoked. He has never used smokeless tobacco. He reports that he does not drink alcohol or use drugs.   ROS:  Please see the history of present illness.   No syncope, no bleeding, no orthopnea, no PND    PHYSICAL EXAM: VS:  BP 126/84   Pulse 72   Ht 5\' 11"  (1.803 m)   Wt 247 lb 12.8 oz (112.4 kg)   BMI 34.56 kg/m  GEN: Well nourished, well developed, in no acute distress  HEENT: normal  Neck: no JVD, carotid bruits, or masses Cardiac: RRR; no murmurs, rubs, or gallops,no edema  Respiratory:  clear to auscultation bilaterally, normal work of breathing GI: soft, nontender, nondistended, + BS MS: no deformity or atrophy  Skin: warm and dry, no rash Neuro:  Alert and Oriented x 3, Strength and sensation are intact Psych: euthymic mood, full affect   EKG:  02/06/18 - NSR bif block. Personally viewed. 02/11/17-sinus rhythm 84 with vertical axis, nonspecific ST-T wave changes personally viewed no significant change from prior- EKG ordered today. 02/01/16-sinus bradycardia 55, vertical axis, no change from prior. Personally viewed. Heart rate slightly increased. 11/26/14-sinus bradycardia rate 58, subtle nonspecific ST changes, vertical axis-prior Sinus bradycardia with sinus arrhythmia, heart rate 48 beats per minute. Possible old septal infarct pattern.     ASSESSMENT AND PLAN:  Previous A-V dissociation.   - Doing well. Asymptomatic bradycardia. No changes made in medications. He is feeling much better. Has not had any further occurrences. No adverse arrhythmias during prior ileus hospitalization. No syncope, no changes.   Hypertension  - excellent doing well. Meds  reviewed.   Obesity  - discussed weight loss plan. Gym. He does not feel safe walking.   One-year follow-up, he would like to continue to maintain visits given his prior electrical abnormality.  Signed, Donato Schultz, MD [facility]  02/06/2018 10:01 AM

## 2018-02-06 NOTE — Patient Instructions (Signed)

## 2018-02-11 DIAGNOSIS — E291 Testicular hypofunction: Secondary | ICD-10-CM | POA: Diagnosis not present

## 2018-02-17 DIAGNOSIS — F3181 Bipolar II disorder: Secondary | ICD-10-CM | POA: Diagnosis not present

## 2018-02-25 DIAGNOSIS — E291 Testicular hypofunction: Secondary | ICD-10-CM | POA: Diagnosis not present

## 2018-03-13 DIAGNOSIS — F3170 Bipolar disorder, currently in remission, most recent episode unspecified: Secondary | ICD-10-CM | POA: Diagnosis not present

## 2018-03-13 DIAGNOSIS — I10 Essential (primary) hypertension: Secondary | ICD-10-CM | POA: Diagnosis not present

## 2018-03-13 DIAGNOSIS — N529 Male erectile dysfunction, unspecified: Secondary | ICD-10-CM | POA: Diagnosis not present

## 2018-03-13 DIAGNOSIS — E291 Testicular hypofunction: Secondary | ICD-10-CM | POA: Diagnosis not present

## 2018-03-17 DIAGNOSIS — G4733 Obstructive sleep apnea (adult) (pediatric): Secondary | ICD-10-CM | POA: Diagnosis not present

## 2018-03-18 DIAGNOSIS — G4733 Obstructive sleep apnea (adult) (pediatric): Secondary | ICD-10-CM | POA: Diagnosis not present

## 2018-03-21 DIAGNOSIS — B079 Viral wart, unspecified: Secondary | ICD-10-CM | POA: Diagnosis not present

## 2018-03-27 DIAGNOSIS — E291 Testicular hypofunction: Secondary | ICD-10-CM | POA: Diagnosis not present

## 2018-04-05 DIAGNOSIS — G4733 Obstructive sleep apnea (adult) (pediatric): Secondary | ICD-10-CM | POA: Diagnosis not present

## 2018-04-08 DIAGNOSIS — H2512 Age-related nuclear cataract, left eye: Secondary | ICD-10-CM | POA: Diagnosis not present

## 2018-04-08 DIAGNOSIS — H04123 Dry eye syndrome of bilateral lacrimal glands: Secondary | ICD-10-CM | POA: Diagnosis not present

## 2018-04-08 DIAGNOSIS — H5213 Myopia, bilateral: Secondary | ICD-10-CM | POA: Diagnosis not present

## 2018-04-10 DIAGNOSIS — E291 Testicular hypofunction: Secondary | ICD-10-CM | POA: Diagnosis not present

## 2018-04-24 DIAGNOSIS — E291 Testicular hypofunction: Secondary | ICD-10-CM | POA: Diagnosis not present

## 2018-04-28 DIAGNOSIS — G4733 Obstructive sleep apnea (adult) (pediatric): Secondary | ICD-10-CM | POA: Diagnosis not present

## 2018-05-06 DIAGNOSIS — G4733 Obstructive sleep apnea (adult) (pediatric): Secondary | ICD-10-CM | POA: Diagnosis not present

## 2018-05-08 DIAGNOSIS — E291 Testicular hypofunction: Secondary | ICD-10-CM | POA: Diagnosis not present

## 2018-05-09 DIAGNOSIS — G4733 Obstructive sleep apnea (adult) (pediatric): Secondary | ICD-10-CM | POA: Diagnosis not present

## 2018-06-03 IMAGING — US US EXTREM LOW VENOUS BILAT
1 series · 13 of 24 positions shown · non-contrast
Comparison: None.

CLINICAL DATA: Bilateral lower extremity edema.



[Series 1: us extrem low venous bilat · 0.09mm/px · 13 of 52 slices shown]
[im 1/52]
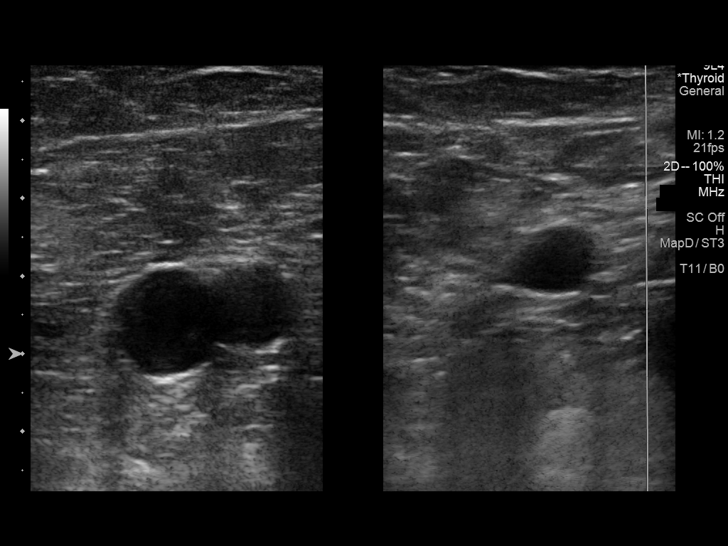
[im 5/52]
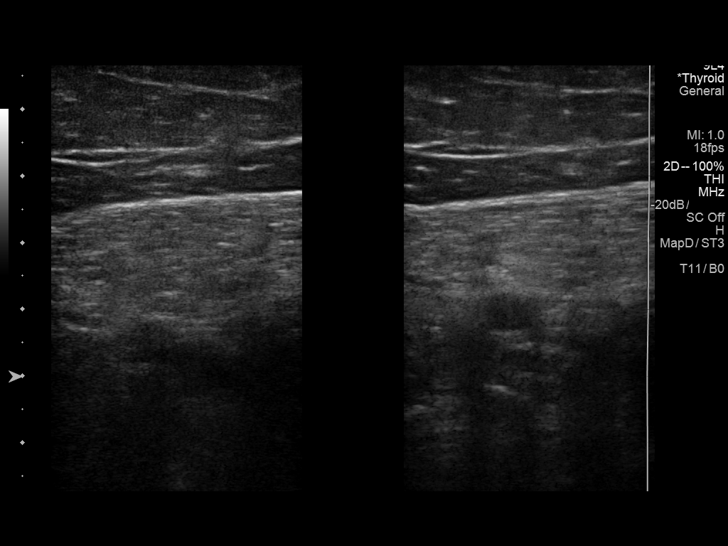
[im 9/52]
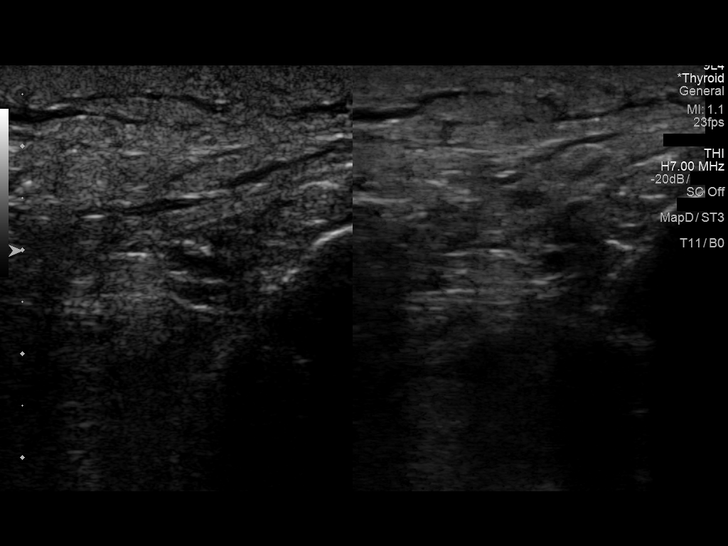
[im 14/52]
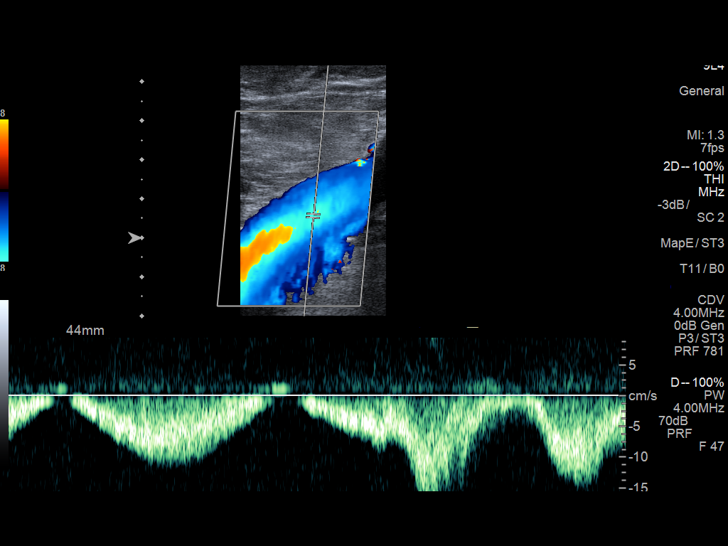
[im 18/52]
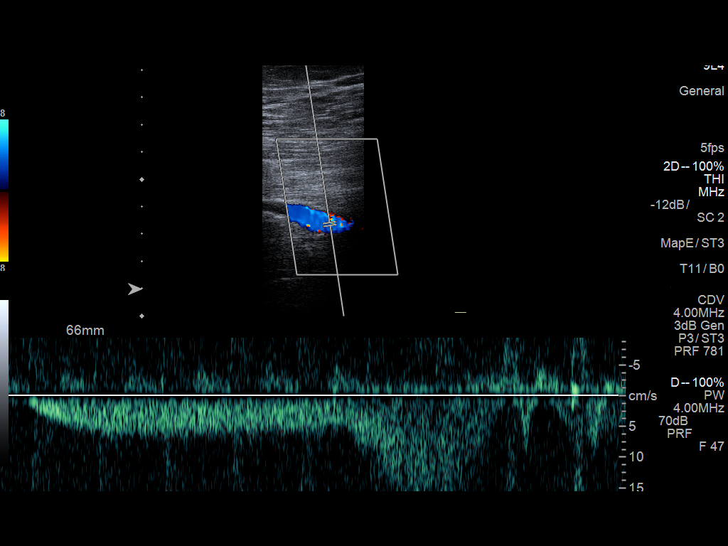
[im 23/52]
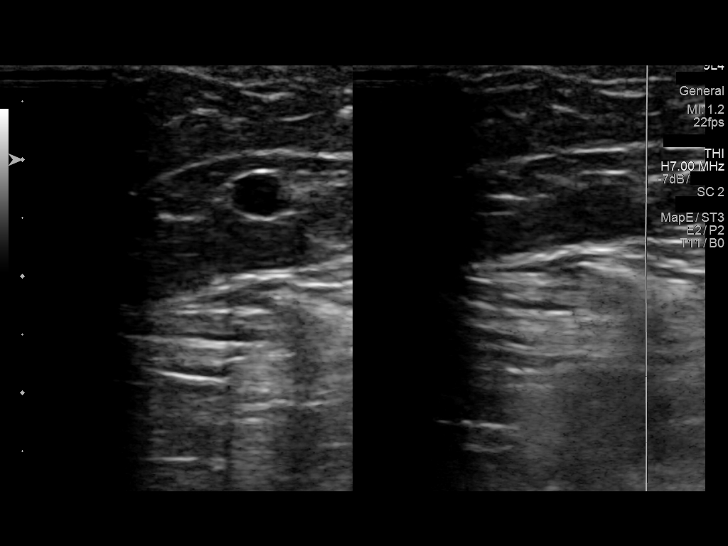
[im 27/52]
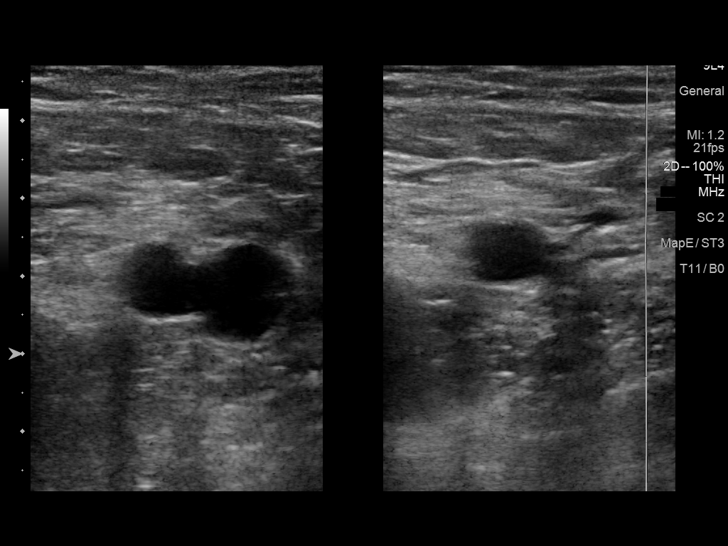
[im 29/52]
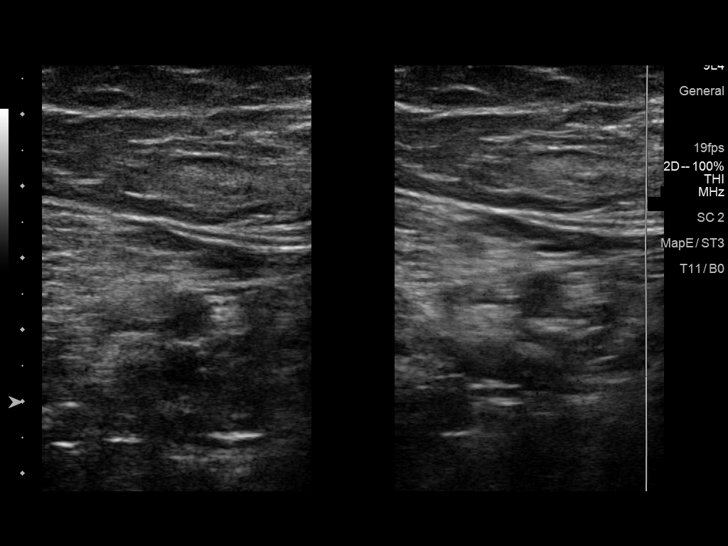
[im 34/52]
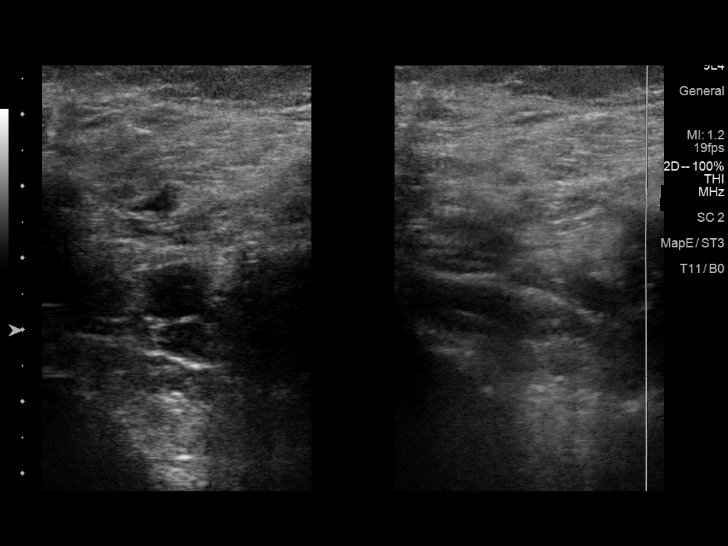
[im 38/52]
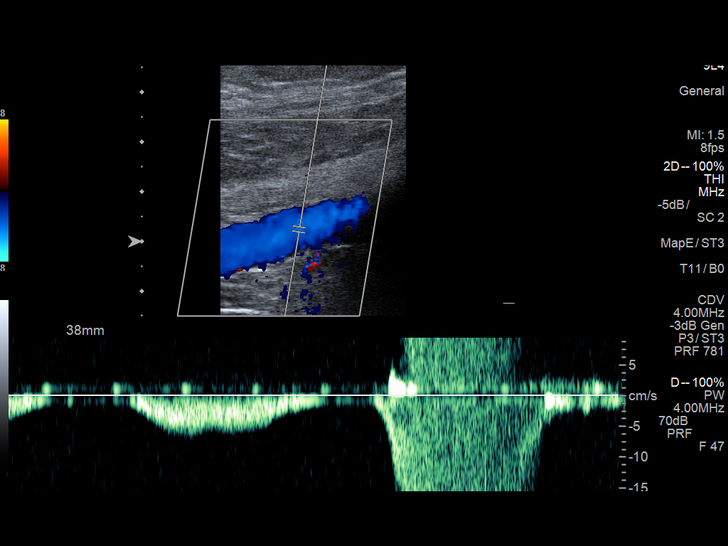
[im 43/52]
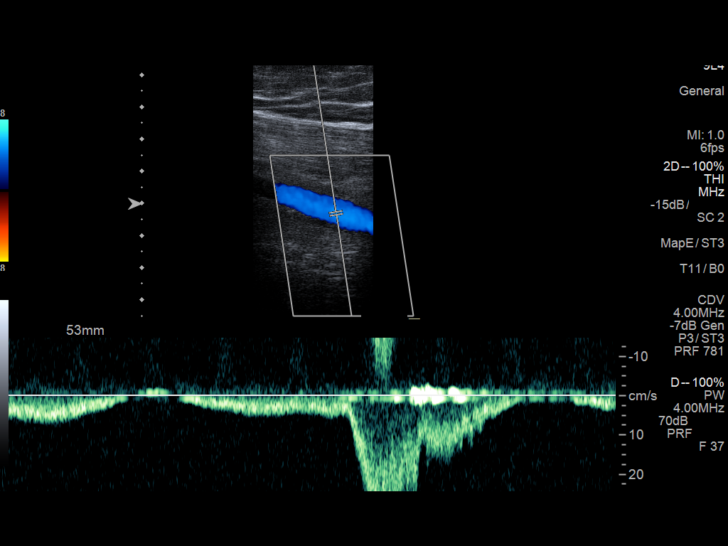
[im 47/52]
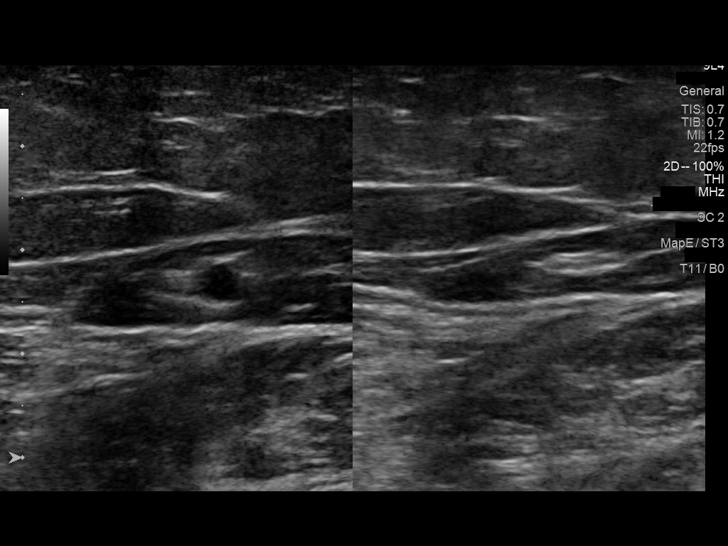
[im 52/52]
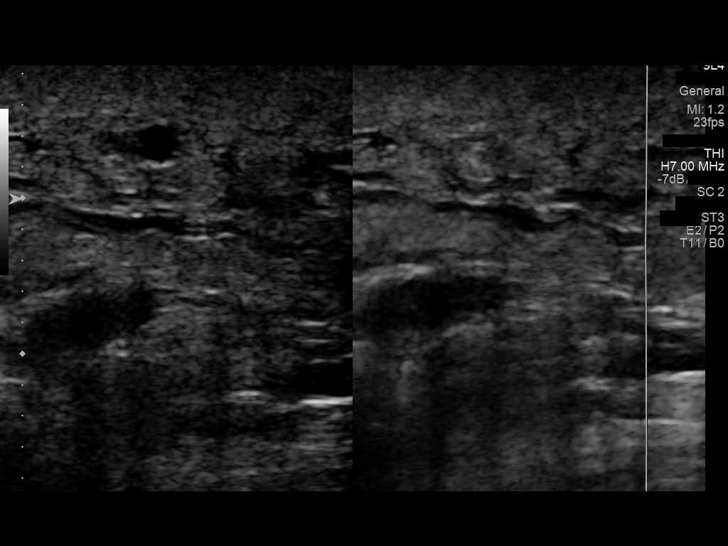

[13 of 24 positions shown; findings below may reference images not displayed]

FINDINGS: RIGHT LOWER EXTREMITY

Common Femoral Vein: No evidence of thrombus. Normal
compressibility, respiratory phasicity and response to augmentation.

Saphenofemoral Junction: No evidence of thrombus. Normal
compressibility and flow on color Doppler imaging.

Profunda Femoral Vein: No evidence of thrombus. Normal
compressibility and flow on color Doppler imaging.

Femoral Vein: No evidence of thrombus. Normal compressibility,
respiratory phasicity and response to augmentation.

Popliteal Vein: No evidence of thrombus. Normal compressibility,
respiratory phasicity and response to augmentation.

Calf Veins: No evidence of thrombus. Normal compressibility and flow
on color Doppler imaging.

Superficial Great Saphenous Vein: No evidence of thrombus. Normal
compressibility.

Venous Reflux:  None.

Other Findings: No evidence of superficial thrombophlebitis or
abnormal fluid collection.

LEFT LOWER EXTREMITY

Common Femoral Vein: No evidence of thrombus. Normal
compressibility, respiratory phasicity and response to augmentation.

Saphenofemoral Junction: No evidence of thrombus. Normal
compressibility and flow on color Doppler imaging.

Profunda Femoral Vein: No evidence of thrombus. Normal
compressibility and flow on color Doppler imaging.

Femoral Vein: No evidence of thrombus. Normal compressibility,
respiratory phasicity and response to augmentation.

Popliteal Vein: No evidence of thrombus. Normal compressibility,
respiratory phasicity and response to augmentation.

Calf Veins: No evidence of thrombus. Normal compressibility and flow
on color Doppler imaging.

Superficial Great Saphenous Vein: No evidence of thrombus. Normal
compressibility.

Venous Reflux:  None.

Other Findings: No evidence of superficial thrombophlebitis or
abnormal fluid collection.
IMPRESSION: No evidence of bilateral lower extremity deep venous thrombosis.

## 2018-06-05 DIAGNOSIS — G4733 Obstructive sleep apnea (adult) (pediatric): Secondary | ICD-10-CM | POA: Diagnosis not present

## 2018-06-07 DIAGNOSIS — R360 Urethral discharge without blood: Secondary | ICD-10-CM | POA: Diagnosis not present

## 2018-06-09 DIAGNOSIS — F3181 Bipolar II disorder: Secondary | ICD-10-CM | POA: Diagnosis not present

## 2018-06-11 DIAGNOSIS — R21 Rash and other nonspecific skin eruption: Secondary | ICD-10-CM | POA: Diagnosis not present

## 2018-06-11 IMAGING — US US EXTREM LOW VENOUS BILAT
1 series · 13 of 20 positions shown · non-contrast
Comparison: Bilateral lower extremity venous duplex examination on
[DATE].

CLINICAL DATA: 61-year-old with bilateral lower extremity swelling.
Evaluate for venous insufficiency. Recent lower extremity venous
duplex was negative for DVT.

EXAM:
BILATERAL LOWER EXTREMITY VENOUS DUPLEX ULTRASOUND
TECHNIQUE: Gray-scale sonography was performed to evaluate the superficial
veins of both lower extremities. A complete superficial venous
insufficiency exam was performed in the upright standing position. I
personally performed the technical portion of the exam.

[Series 1: us extrem low venous bilat · 13 of 20 slices shown]
[im 1/20]
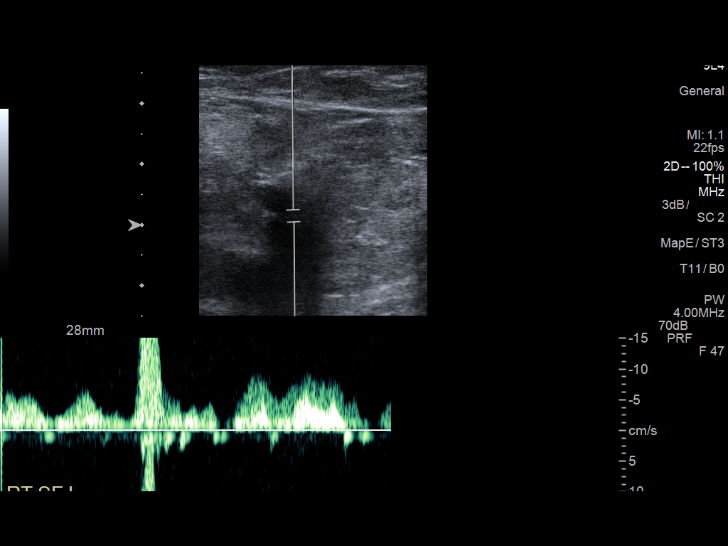
[im 3/20]
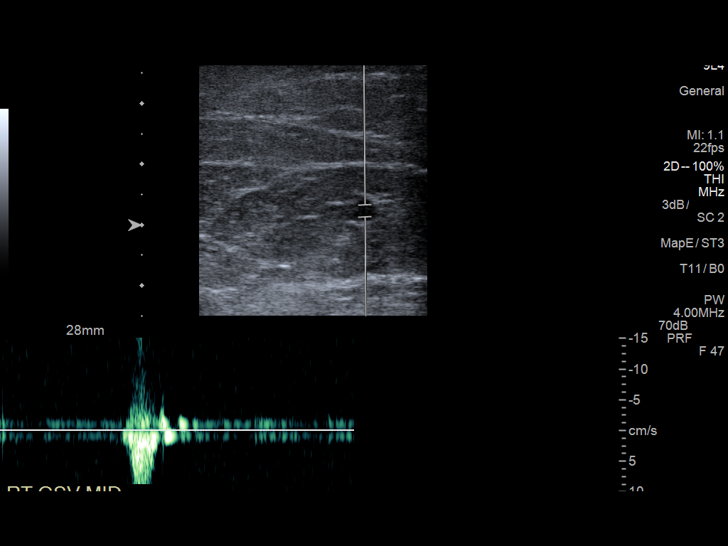
[im 4/20]
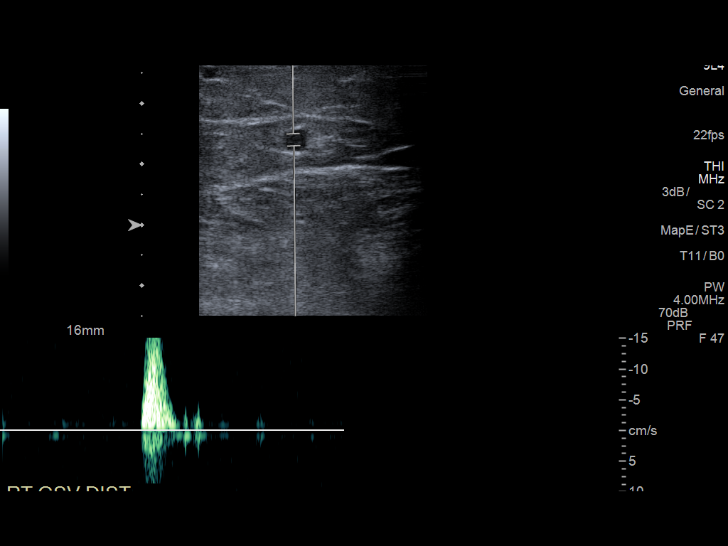
[im 6/20]
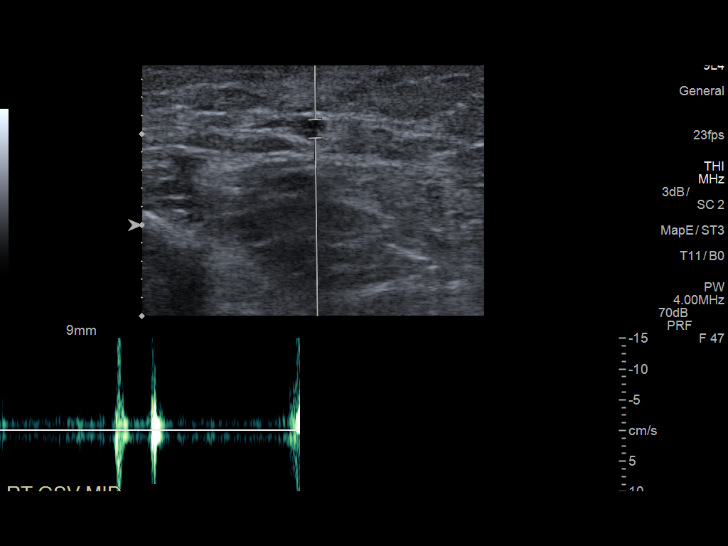
[im 7/20]
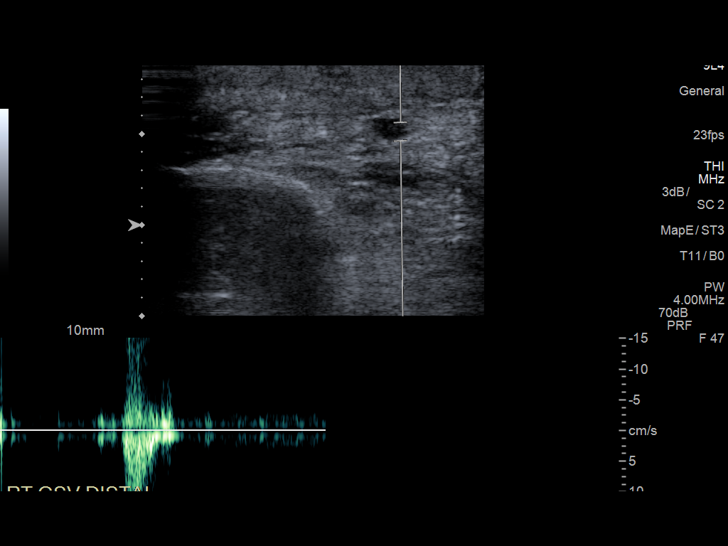
[im 9/20]
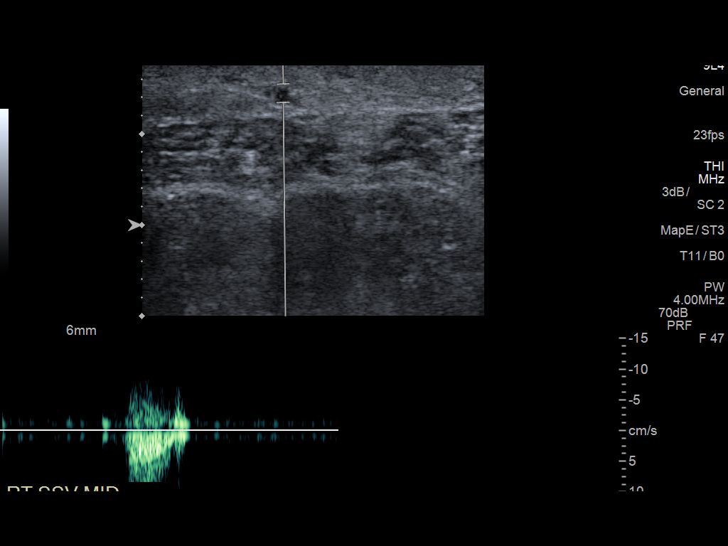
[im 11/20]
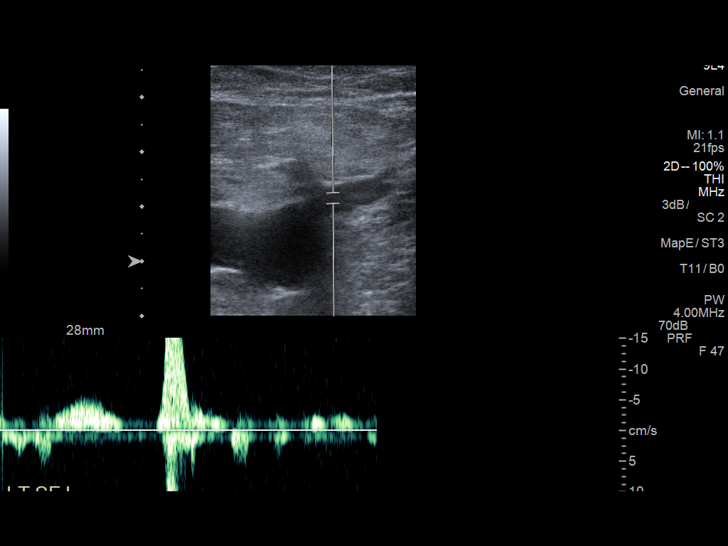
[im 12/20]
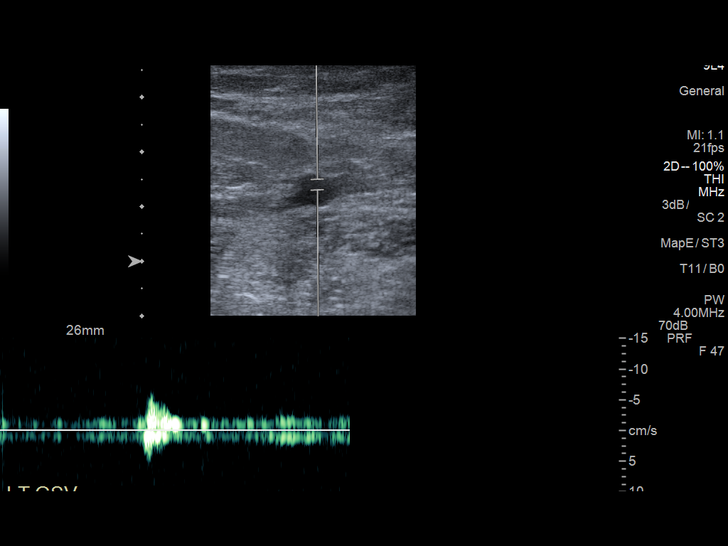
[im 14/20]
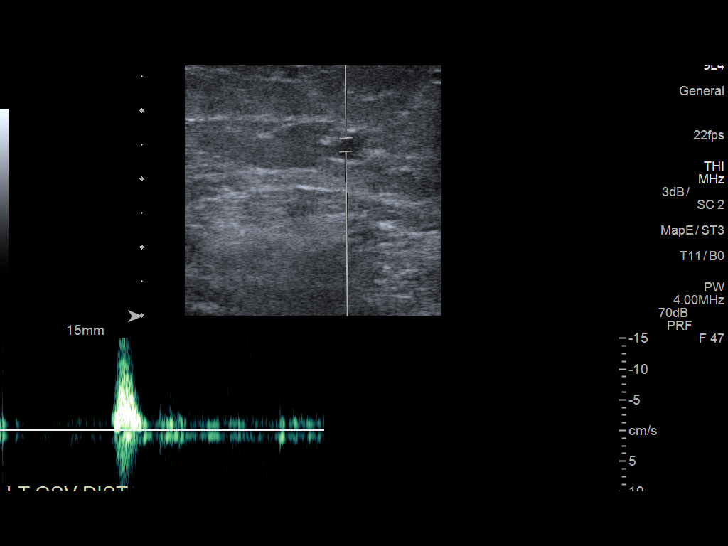
[im 15/20]
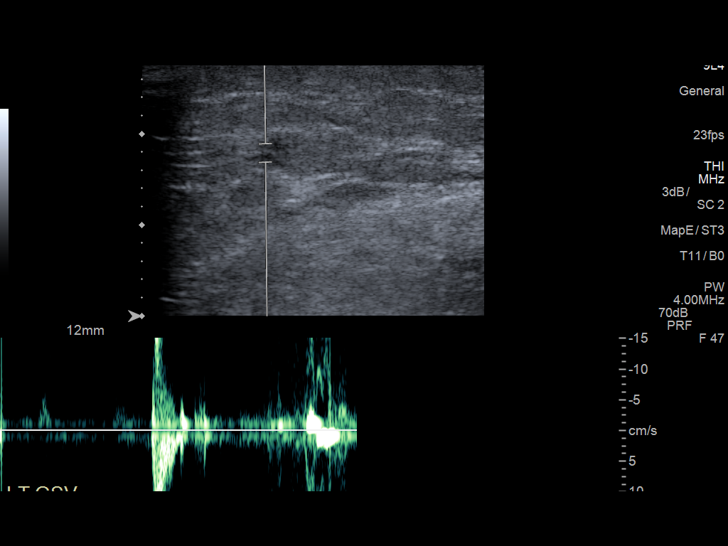
[im 17/20]
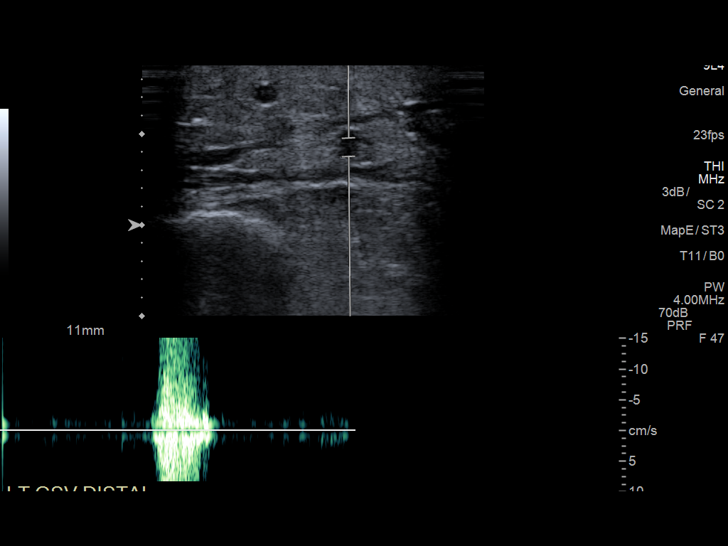
[im 18/20]
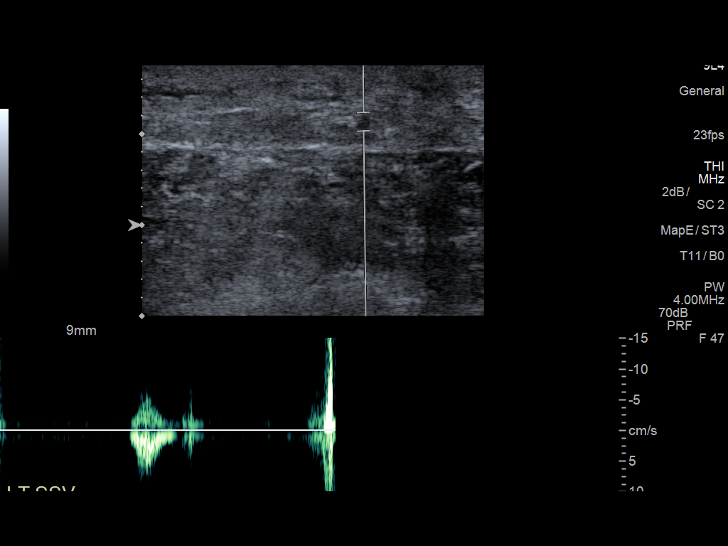
[im 20/20]
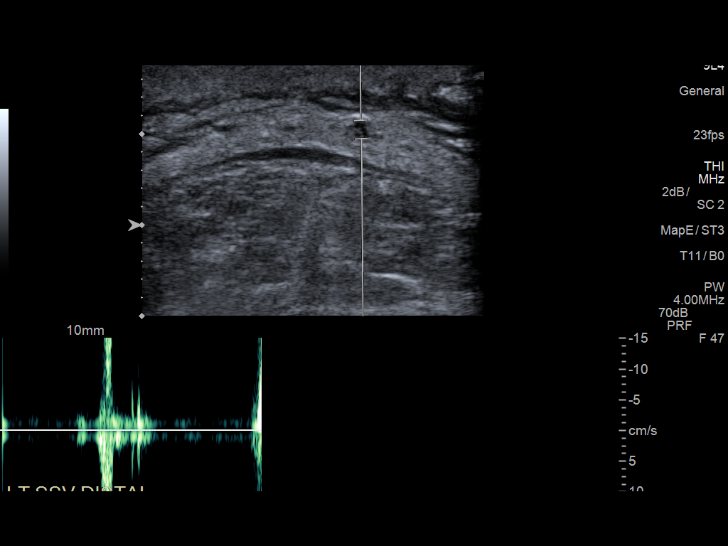

[13 of 20 positions shown; findings below may reference images not displayed]

FINDINGS: Right lower extremity: The right saphenofemoral junction is patent.
Normal caliber of the right great saphenous vein without reflux. No
varicosities. Small right short saphenous vein without reflux.

Left lower extremity: The left saphenofemoral junction is patent.
Normal caliber of the left great saphenous vein without reflux.
Small left short saphenous vein without reflux. No varicosities.

Subcutaneous edema in the lower calves bilaterally.
IMPRESSION: Negative superficial venous examination. No evidence for superficial
venous insufficiency or reflux within the bilateral lower extremity
saphenous veins.

## 2018-06-18 DIAGNOSIS — H2512 Age-related nuclear cataract, left eye: Secondary | ICD-10-CM | POA: Diagnosis not present

## 2018-06-18 DIAGNOSIS — H25812 Combined forms of age-related cataract, left eye: Secondary | ICD-10-CM | POA: Diagnosis not present

## 2018-06-19 DIAGNOSIS — E291 Testicular hypofunction: Secondary | ICD-10-CM | POA: Diagnosis not present

## 2018-06-25 DIAGNOSIS — G4733 Obstructive sleep apnea (adult) (pediatric): Secondary | ICD-10-CM | POA: Diagnosis not present

## 2018-06-27 DIAGNOSIS — M1009 Idiopathic gout, multiple sites: Secondary | ICD-10-CM | POA: Diagnosis not present

## 2018-06-27 DIAGNOSIS — R635 Abnormal weight gain: Secondary | ICD-10-CM | POA: Diagnosis not present

## 2018-06-27 DIAGNOSIS — Z79899 Other long term (current) drug therapy: Secondary | ICD-10-CM | POA: Diagnosis not present

## 2018-06-27 DIAGNOSIS — E669 Obesity, unspecified: Secondary | ICD-10-CM | POA: Diagnosis not present

## 2018-07-03 DIAGNOSIS — E291 Testicular hypofunction: Secondary | ICD-10-CM | POA: Diagnosis not present

## 2018-07-06 DIAGNOSIS — G4733 Obstructive sleep apnea (adult) (pediatric): Secondary | ICD-10-CM | POA: Diagnosis not present

## 2018-07-10 DIAGNOSIS — R972 Elevated prostate specific antigen [PSA]: Secondary | ICD-10-CM | POA: Diagnosis not present

## 2018-07-10 DIAGNOSIS — N529 Male erectile dysfunction, unspecified: Secondary | ICD-10-CM | POA: Diagnosis not present

## 2018-07-10 DIAGNOSIS — E291 Testicular hypofunction: Secondary | ICD-10-CM | POA: Diagnosis not present

## 2018-08-01 DIAGNOSIS — E291 Testicular hypofunction: Secondary | ICD-10-CM | POA: Diagnosis not present

## 2018-08-06 DIAGNOSIS — G4733 Obstructive sleep apnea (adult) (pediatric): Secondary | ICD-10-CM | POA: Diagnosis not present

## 2018-08-15 DIAGNOSIS — E291 Testicular hypofunction: Secondary | ICD-10-CM | POA: Diagnosis not present

## 2018-08-28 DIAGNOSIS — E291 Testicular hypofunction: Secondary | ICD-10-CM | POA: Diagnosis not present

## 2018-09-05 DIAGNOSIS — G4733 Obstructive sleep apnea (adult) (pediatric): Secondary | ICD-10-CM | POA: Diagnosis not present

## 2018-09-12 DIAGNOSIS — E291 Testicular hypofunction: Secondary | ICD-10-CM | POA: Diagnosis not present

## 2018-09-15 ENCOUNTER — Encounter: Payer: Self-pay | Admitting: Emergency Medicine

## 2018-09-24 DIAGNOSIS — M25561 Pain in right knee: Secondary | ICD-10-CM | POA: Diagnosis not present

## 2018-09-24 DIAGNOSIS — M25562 Pain in left knee: Secondary | ICD-10-CM | POA: Diagnosis not present

## 2018-09-26 DIAGNOSIS — E291 Testicular hypofunction: Secondary | ICD-10-CM | POA: Diagnosis not present

## 2018-09-29 ENCOUNTER — Ambulatory Visit (INDEPENDENT_AMBULATORY_CARE_PROVIDER_SITE_OTHER): Payer: BLUE CROSS/BLUE SHIELD | Admitting: Psychiatry

## 2018-09-29 ENCOUNTER — Encounter: Payer: Self-pay | Admitting: Psychiatry

## 2018-09-29 DIAGNOSIS — G4733 Obstructive sleep apnea (adult) (pediatric): Secondary | ICD-10-CM | POA: Diagnosis not present

## 2018-09-29 DIAGNOSIS — F4310 Post-traumatic stress disorder, unspecified: Secondary | ICD-10-CM | POA: Diagnosis not present

## 2018-09-29 DIAGNOSIS — F3181 Bipolar II disorder: Secondary | ICD-10-CM

## 2018-09-29 NOTE — Progress Notes (Signed)
TILDEN BROZ [id number] January 11, 1956 62 y.o.  Subjective:   Patient ID:  Edwin [name] is a 62 y.o. (DOB 04/17/1956) male.  Chief Complaint:  Chief Complaint  Patient presents with  . Follow-up    severe bipolar depression, med changes    HPI DELANDO SATTER presents to the office today for follow-up of bipolar depression.  Depression is worse off the pramipexole. Tried stopping it without success.  Has restarted and it's much better now.  August 2017 was when he started pramipexole.  Done a great job of managing his bipolar depression.  Pleased with work.  Has most kids ever taking lessons.  He's also taking guitar lessons.  Doing upgrades on the house.  No significant manic symptoms.    Patient reports stable mood and denies depressed or irritable moods.  Patient denies any recent difficulty with anxiety.  Patient denies difficulty with sleep initiation or maintenance. Denies appetite disturbance.  Patient reports that energy and motivation have been good.  Patient denies any difficulty with concentration.  Patient denies any suicidal ideation.    Review of Systems:  Review of Systems  Neurological: Negative for tremors and weakness.  Psychiatric/Behavioral: Negative for agitation, behavioral problems, confusion, decreased concentration, dysphoric mood, hallucinations, self-injury, sleep disturbance and suicidal ideas. The patient is not nervous/anxious and is not hyperactive.     Medications: I have reviewed the patient's current medications.  Current Outpatient Medications  Medication Sig Dispense Refill  . amLODipine (NORVASC) 2.5 MG tablet Take 1 tablet by mouth daily.  10  . Cholecalciferol 1000 units TBDP Take 2 tablets by mouth daily.     . divalproex (DEPAKOTE ER) 500 MG 24 hr tablet Take 2 tablets (1,000 mg total) by mouth at bedtime. Take total of 1,000 mg total at bedtime. 60 tablet 0  . DULoxetine (CYMBALTA) 60 MG capsule Take 2 capsules (120 mg total) by mouth at  bedtime. 60 capsule 0  . febuxostat (ULORIC) 40 MG tablet Take 40 mg by mouth daily.    [name] lamoTRIgine (LAMICTAL) 200 MG tablet Take 200 mg by mouth daily.   0  . LevOCARNitine (L-CARNITINE) 500 MG TABS Take 500 mg by mouth 2 (two) times daily.    [name] lithium carbonate 150 MG capsule Take 150 mg by mouth at bedtime.    [name] losartan (COZAAR) 50 MG tablet Take 1 tablet (50 mg total) by mouth daily. 30 tablet 0  . polyethylene glycol (MIRALAX) packet Take 17 g by mouth daily. 14 each 0  . pramipexole (MIRAPEX) 0.5 MG tablet Take 0.5 mg by mouth 2 (two) times daily.     No current facility-administered medications for this visit.     Medication Side Effects: Other: some weight gain.  Allergies:  Allergies  Allergen Reactions  . Allopurinol Other (See Comments)    Made pt emotionally unstable   . Penicillins Hives and Other (See Comments)    Has patient had a PCN reaction causing immediate rash, facial/tongue/throat swelling, SOB or lightheadedness with hypotension: No Has patient had a PCN reaction causing severe rash involving mucus membranes or skin necrosis: No Has patient had a PCN reaction that required hospitalization No Has patient had a PCN reaction occurring within the last 10 years: No If all of the above answers are "NO", then may proceed with Cephalosporin use.  . Aspirin Hives  . Ciprofloxacin Rash    Past Medical History:  Diagnosis Date  . Allergic rhinitis   . Bipolar disorder (HCC)   .  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 CKD (chronic kidney disease) stage 2, GFR 60-89 ml/min   . Gout   . Hypertension   . Mild renal insufficiency    , with creatinine of 1.3 in 2010, was side effect of med  . Obesity    ,moderate  . Prostatitis    , Episodic  . Suicide attempt (HCC) 09/20/2014    Family History  Problem Relation Age of Onset  . Hypertension Father   . Gout Father   . Alzheimer's disease Father   . Other Mother        Viral Meningitis  . Mitral valve  prolapse Mother   . Arrhythmia Mother   . Hypothyroidism Mother     Social History   Socioeconomic History  . Marital status: Single    Spouse name: Not on file  . Number of children: Not on file  . Years of education: Not on file  . Highest education level: Not on file  Occupational History  . Not on file  Social Needs  . Financial resource strain: Not on file  . Food insecurity:    Worry: Not on file    Inability: Not on file  . Transportation needs:    Medical: Not on file    Non-medical: Not on file  Tobacco Use  . Smoking status: Never Smoker  . Smokeless tobacco: Never Used  Substance and Sexual Activity  . Alcohol use: No  . Drug use: No  . Sexual activity: Not on file  Lifestyle  . Physical activity:    Days per week: Not on file    Minutes per session: Not on file  . Stress: Not on file  Relationships  . Social connections:    Talks on phone: Not on file    Gets together: Not on file    Attends religious service: Not on file    Active member of club or organization: Not on file    Attends meetings of clubs or organizations: Not on file    Relationship status: Not on file  . Intimate partner violence:    Fear of current or ex partner: Not on file    Emotionally abused: Not on file    Physically abused: Not on file    Forced sexual activity: Not on file  Other Topics Concern  . Not on file  Social History Narrative  . Not on file    Past Medical History, Surgical history, Social history, and Family history were reviewed and updated as appropriate.   M in [facility].  Please see review of systems for further details on the patient's review from today.   Objective:   Physical Exam:  There were no vitals taken for this visit.  Physical Exam  Constitutional: He is oriented to person, place, and time. He appears well-developed. No distress.  Musculoskeletal: He exhibits no deformity.  Neurological: He is alert and oriented to person, place, and time. He  displays no tremor. Coordination and gait normal.  Psychiatric: He has a normal mood and affect. His speech is normal and behavior is normal. Judgment and thought content normal. His mood appears not anxious. His affect is not angry, not blunt, not labile and not inappropriate. Cognition and memory are normal. He does not exhibit a depressed mood. He expresses no homicidal and no suicidal ideation. He expresses no suicidal plans and no homicidal plans.  Insight intact. No auditory or visual hallucinations. No mania. He is attentive.  Lab Review:     Component Value Date/Time   NA 140 02/04/2017 0525   K 4.1 02/04/2017 0525   CL 105 02/04/2017 0525   CO2 27 02/04/2017 0525   GLUCOSE 120 (H) 02/04/2017 0525   BUN 10 02/04/2017 0525   CREATININE 1.07 02/04/2017 0525   CALCIUM 9.2 02/04/2017 0525   PROT 6.9 02/01/2017 0152   ALBUMIN 3.8 02/01/2017 0152   AST 21 02/01/2017 0152   ALT 27 02/01/2017 0152   ALKPHOS 56 02/01/2017 0152   BILITOT 0.6 02/01/2017 0152   GFRNONAA >60 02/04/2017 0525   GFRAA >60 02/04/2017 0525       Component Value Date/Time   WBC 7.4 02/03/2017 0527   RBC 3.48 (L) 02/03/2017 0527   HGB 10.8 (L) 02/03/2017 0527   HCT 31.1 (L) 02/03/2017 0527   PLT 253 02/03/2017 0527   MCV 89.4 02/03/2017 0527   MCH 31.0 02/03/2017 0527   MCHC 34.7 02/03/2017 0527   RDW 13.6 02/03/2017 0527   LYMPHSABS 1.7 02/01/2017 0205   MONOABS 2.2 (H) 02/01/2017 0205   EOSABS 0.1 02/01/2017 0205   BASOSABS 0.2 (H) 02/01/2017 0205    No results found for: POCLITH, LITHIUM   Lab Results  Component Value Date   VALPROATE <10.0 (L) 09/22/2014     .res Assessment: Plan:    Bipolar II disorder (HCC)  Obstructive sleep apnea  PTSD (post-traumatic stress disorder) Related to the trauma in 80's of uncontrolled bipolar disorder.  Greater than 50% of face to face time with patient was spent on counseling and coordination of care. We discussed importance of sleep maintenance  for mood stability and the need to continue CPAP.  Discussed dxes.  Disc SE in detail.  Supportive therapy on maintenance of stability and self care.  Disc weight loss in detail.  Disc various weight loss strategies and diet options and encouraged exercise.  FU 3-4 mos  Meredith Staggers, MD, DFAPA   This appt was 30 mins.  Please see After Visit Summary for patient specific instructions.  No future appointments.  No orders of the defined types were placed in this encounter.     -------------------------------

## 2018-10-06 DIAGNOSIS — G4733 Obstructive sleep apnea (adult) (pediatric): Secondary | ICD-10-CM | POA: Diagnosis not present

## 2018-10-10 DIAGNOSIS — E291 Testicular hypofunction: Secondary | ICD-10-CM | POA: Diagnosis not present

## 2018-10-11 ENCOUNTER — Other Ambulatory Visit: Payer: Self-pay | Admitting: Psychiatry

## 2018-10-24 DIAGNOSIS — E291 Testicular hypofunction: Secondary | ICD-10-CM | POA: Diagnosis not present

## 2018-10-29 DIAGNOSIS — D225 Melanocytic nevi of trunk: Secondary | ICD-10-CM | POA: Diagnosis not present

## 2018-10-29 DIAGNOSIS — L821 Other seborrheic keratosis: Secondary | ICD-10-CM | POA: Diagnosis not present

## 2018-10-29 DIAGNOSIS — B353 Tinea pedis: Secondary | ICD-10-CM | POA: Diagnosis not present

## 2018-10-29 DIAGNOSIS — D1801 Hemangioma of skin and subcutaneous tissue: Secondary | ICD-10-CM | POA: Diagnosis not present

## 2018-11-05 DIAGNOSIS — G4733 Obstructive sleep apnea (adult) (pediatric): Secondary | ICD-10-CM | POA: Diagnosis not present

## 2018-11-07 DIAGNOSIS — E291 Testicular hypofunction: Secondary | ICD-10-CM | POA: Diagnosis not present

## 2018-11-08 DIAGNOSIS — J019 Acute sinusitis, unspecified: Secondary | ICD-10-CM | POA: Diagnosis not present

## 2018-11-13 ENCOUNTER — Other Ambulatory Visit: Payer: Self-pay | Admitting: Psychiatry

## 2018-11-13 DIAGNOSIS — J111 Influenza due to unidentified influenza virus with other respiratory manifestations: Secondary | ICD-10-CM | POA: Diagnosis not present

## 2018-11-21 DIAGNOSIS — E291 Testicular hypofunction: Secondary | ICD-10-CM | POA: Diagnosis not present

## 2018-12-04 DIAGNOSIS — Z8719 Personal history of other diseases of the digestive system: Secondary | ICD-10-CM | POA: Diagnosis not present

## 2018-12-05 DIAGNOSIS — E291 Testicular hypofunction: Secondary | ICD-10-CM | POA: Diagnosis not present

## 2018-12-19 DIAGNOSIS — E291 Testicular hypofunction: Secondary | ICD-10-CM | POA: Diagnosis not present

## 2018-12-20 DIAGNOSIS — B349 Viral infection, unspecified: Secondary | ICD-10-CM | POA: Diagnosis not present

## 2018-12-31 ENCOUNTER — Ambulatory Visit: Payer: BLUE CROSS/BLUE SHIELD | Admitting: Psychiatry

## 2018-12-31 ENCOUNTER — Encounter: Payer: Self-pay | Admitting: Psychiatry

## 2018-12-31 DIAGNOSIS — G4733 Obstructive sleep apnea (adult) (pediatric): Secondary | ICD-10-CM | POA: Diagnosis not present

## 2018-12-31 DIAGNOSIS — F3181 Bipolar II disorder: Secondary | ICD-10-CM | POA: Diagnosis not present

## 2018-12-31 NOTE — Progress Notes (Signed)
Edwin [name] [id number] 03-01-1956 63 y.o.  Subjective:   Patient ID:  Edwin [name] is a 63 y.o. (DOB [birth year]) male.  Chief Complaint:  Chief Complaint  Patient presents with  . Follow-up    Medication Management    HPI last seen in July [name] presents to the office today for follow-up of bipolar depression and anxiety.  Overall pretty well.  Still taking pramipexole and it's helped mood but he thinks it's caused weight gain.  Beginning to get it under control.  He needs the pramipexole for mood.    Patient reports stable mood and denies depressed or irritable moods.  Patient denies any recent difficulty with anxiety.  Patient denies difficulty with sleep initiation or maintenance. Denies appetite disturbance.  Patient reports that energy and motivation have been good.  Patient denies any difficulty with concentration.  Patient denies any suicidal ideation.   Past Psychiatric Medication Trials: Lithium increased Cr, Wellbutrin NR, Vraylar tremor, Rexulti SE, Abilify increased weight, lamotrigine, gabapentin,   Review of Systems:  Review of Systems  Neurological: Negative for tremors and weakness.  Psychiatric/Behavioral: Negative for agitation, behavioral problems, confusion, decreased concentration, hallucinations, self-injury and suicidal ideas. The patient is not hyperactive.     Medications: I have reviewed the patient's current medications.  Current Outpatient Medications  Medication Sig Dispense Refill  . amLODipine (NORVASC) 2.5 MG tablet Take 1 tablet by mouth daily.  10  . Cholecalciferol 1000 units TBDP Take 1 tablet by mouth daily.     . divalproex (DEPAKOTE ER) 500 MG 24 hr tablet TAKE 2 TABLETS AT BEDTIME. 60 tablet 3  . DULoxetine (CYMBALTA) 60 MG capsule TAKE 2 CAPSULES DAILY. 60 capsule 3  . febuxostat (ULORIC) 40 MG tablet Take 40 mg by mouth daily.    [name] lamoTRIgine (LAMICTAL) 200 MG tablet TAKE 1 TABLET IN THE MORNING. 30 tablet 3  .  LevOCARNitine (L-CARNITINE) 500 MG TABS Take 500 mg by mouth 2 (two) times daily.    [name] losartan (COZAAR) 50 MG tablet Take 1 tablet (50 mg total) by mouth daily. 30 tablet 0  . pramipexole (MIRAPEX) 0.5 MG tablet TAKE 1 TABLET BY MOUTH TWICE DAILY. 60 tablet 2  . psyllium (METAMUCIL) 58.6 % powder Take 1 packet by mouth 3 (three) times daily.    . polyethylene glycol (MIRALAX) packet Take 17 g by mouth daily. (Patient not taking: Reported on 12/31/2018) 14 each 0   No current facility-administered medications for this visit.     Medication Side Effects: Other: weight gain.  Allergies:  Allergies  Allergen Reactions  . Allopurinol Other (See Comments)    Made pt emotionally unstable   . Penicillins Hives and Other (See Comments)    Has patient had a PCN reaction causing immediate rash, facial/tongue/throat swelling, SOB or lightheadedness with hypotension: No Has patient had a PCN reaction causing severe rash involving mucus membranes or skin necrosis: No Has patient had a PCN reaction that required hospitalization No Has patient had a PCN reaction occurring within the last 10 years: No If all of the above answers are "NO", then may proceed with Cephalosporin use.  . Aspirin Hives  . Ciprofloxacin Rash    Past Medical History:  Diagnosis Date  . Allergic rhinitis   . Bipolar disorder (HCC)   . 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 CKD (chronic kidney disease) stage 2, GFR 60-89 ml/min   . Gout   . Hypertension   . Mild  renal insufficiency    , with creatinine of 1.3 in 2010, was side effect of med  . Obesity    ,moderate  . Prostatitis    , Episodic  . Suicide attempt (HCC) 09/20/2014    Family History  Problem Relation Age of Onset  . Hypertension Father   . Gout Father   . Alzheimer's disease Father   . Other Mother        Viral Meningitis  . Mitral valve prolapse Mother   . Arrhythmia Mother   . Hypothyroidism Mother     Social History   Socioeconomic History   . Marital status: Single    Spouse name: Not on file  . Number of children: Not on file  . Years of education: Not on file  . Highest education level: Not on file  Occupational History  . Not on file  Social Needs  . Financial resource strain: Not on file  . Food insecurity:    Worry: Not on file    Inability: Not on file  . Transportation needs:    Medical: Not on file    Non-medical: Not on file  Tobacco Use  . Smoking status: Never Smoker  . Smokeless tobacco: Never Used  Substance and Sexual Activity  . Alcohol use: No  . Drug use: No  . Sexual activity: Not on file  Lifestyle  . Physical activity:    Days per week: Not on file    Minutes per session: Not on file  . Stress: Not on file  Relationships  . Social connections:    Talks on phone: Not on file    Gets together: Not on file    Attends religious service: Not on file    Active member of club or organization: Not on file    Attends meetings of clubs or organizations: Not on file    Relationship status: Not on file  . Intimate partner violence:    Fear of current or ex partner: Not on file    Emotionally abused: Not on file    Physically abused: Not on file    Forced sexual activity: Not on file  Other Topics Concern  . Not on file  Social History Narrative  . Not on file    Past Medical History, Surgical history, Social history, and Family history were reviewed and updated as appropriate.   Please see review of systems for further details on the patient's review from today.   Objective:   Physical Exam:  There were no vitals taken for this visit.  Physical Exam Constitutional:      General: He is not in acute distress.    Appearance: He is well-developed.  Musculoskeletal:        General: No deformity.  Neurological:     Mental Status: He is alert and oriented to person, place, and time.     Motor: No tremor.     Coordination: Coordination normal.     Gait: Gait normal.  Psychiatric:         Attention and Perception: Attention normal. He is attentive.        Mood and Affect: Mood normal. Mood is not anxious or depressed. Affect is not labile, blunt, angry or inappropriate.        Speech: Speech normal.        Behavior: Behavior normal.        Thought Content: Thought content normal. Thought content does not include homicidal or suicidal ideation. Thought content  does not include homicidal or suicidal plan.        Cognition and Memory: Cognition normal.        Judgment: Judgment normal.     Comments: Insight is good.     Lab Review:     Component Value Date/Time   NA 140 02/04/2017 0525   K 4.1 02/04/2017 0525   CL 105 02/04/2017 0525   CO2 27 02/04/2017 0525   GLUCOSE 120 (H) 02/04/2017 0525   BUN 10 02/04/2017 0525   CREATININE 1.07 02/04/2017 0525   CALCIUM 9.2 02/04/2017 0525   PROT 6.9 02/01/2017 0152   ALBUMIN 3.8 02/01/2017 0152   AST 21 02/01/2017 0152   ALT 27 02/01/2017 0152   ALKPHOS 56 02/01/2017 0152   BILITOT 0.6 02/01/2017 0152   GFRNONAA >60 02/04/2017 0525   GFRAA >60 02/04/2017 0525       Component Value Date/Time   WBC 7.4 02/03/2017 0527   RBC 3.48 (L) 02/03/2017 0527   HGB 10.8 (L) 02/03/2017 0527   HCT 31.1 (L) 02/03/2017 0527   PLT 253 02/03/2017 0527   MCV 89.4 02/03/2017 0527   MCH 31.0 02/03/2017 0527   MCHC 34.7 02/03/2017 0527   RDW 13.6 02/03/2017 0527   LYMPHSABS 1.7 02/01/2017 0205   MONOABS 2.2 (H) 02/01/2017 0205   EOSABS 0.1 02/01/2017 0205   BASOSABS 0.2 (H) 02/01/2017 0205    No results found for: POCLITH, LITHIUM   Lab Results  Component Value Date   VALPROATE <10.0 (L) 09/22/2014     .res Assessment: Plan:    Bipolar II disorder (HCC)  Obstructive sleep apnea    Good response to the med combination.  Polypharmacy necessary.    Disc weight loss strategies  FU 4 mos  Meredith Staggers, MD, DFAPA   Please see After Visit Summary for patient specific instructions.  No future appointments.  No  orders of the defined types were placed in this encounter.     -------------------------------

## 2019-01-02 DIAGNOSIS — E291 Testicular hypofunction: Secondary | ICD-10-CM | POA: Diagnosis not present

## 2019-01-16 DIAGNOSIS — E291 Testicular hypofunction: Secondary | ICD-10-CM | POA: Diagnosis not present

## 2019-01-20 DIAGNOSIS — Z713 Dietary counseling and surveillance: Secondary | ICD-10-CM | POA: Diagnosis not present

## 2019-01-30 DIAGNOSIS — E291 Testicular hypofunction: Secondary | ICD-10-CM | POA: Diagnosis not present

## 2019-02-02 ENCOUNTER — Other Ambulatory Visit: Payer: Self-pay | Admitting: Psychiatry

## 2019-03-04 DIAGNOSIS — Z79899 Other long term (current) drug therapy: Secondary | ICD-10-CM | POA: Diagnosis not present

## 2019-03-04 DIAGNOSIS — M1009 Idiopathic gout, multiple sites: Secondary | ICD-10-CM | POA: Diagnosis not present

## 2019-03-13 DIAGNOSIS — E291 Testicular hypofunction: Secondary | ICD-10-CM | POA: Diagnosis not present

## 2019-03-27 DIAGNOSIS — E291 Testicular hypofunction: Secondary | ICD-10-CM | POA: Diagnosis not present

## 2019-04-02 DIAGNOSIS — R7989 Other specified abnormal findings of blood chemistry: Secondary | ICD-10-CM | POA: Diagnosis not present

## 2019-04-10 DIAGNOSIS — E291 Testicular hypofunction: Secondary | ICD-10-CM | POA: Diagnosis not present

## 2019-04-16 DIAGNOSIS — Z03818 Encounter for observation for suspected exposure to other biological agents ruled out: Secondary | ICD-10-CM | POA: Diagnosis not present

## 2019-04-20 DIAGNOSIS — F3170 Bipolar disorder, currently in remission, most recent episode unspecified: Secondary | ICD-10-CM | POA: Diagnosis not present

## 2019-04-20 DIAGNOSIS — G4733 Obstructive sleep apnea (adult) (pediatric): Secondary | ICD-10-CM | POA: Diagnosis not present

## 2019-04-28 DIAGNOSIS — E291 Testicular hypofunction: Secondary | ICD-10-CM | POA: Diagnosis not present

## 2019-05-06 ENCOUNTER — Other Ambulatory Visit: Payer: Self-pay

## 2019-05-06 ENCOUNTER — Ambulatory Visit (INDEPENDENT_AMBULATORY_CARE_PROVIDER_SITE_OTHER): Payer: BC Managed Care – PPO | Admitting: Psychiatry

## 2019-05-06 ENCOUNTER — Encounter: Payer: Self-pay | Admitting: Psychiatry

## 2019-05-06 DIAGNOSIS — G4733 Obstructive sleep apnea (adult) (pediatric): Secondary | ICD-10-CM

## 2019-05-06 DIAGNOSIS — F4310 Post-traumatic stress disorder, unspecified: Secondary | ICD-10-CM | POA: Diagnosis not present

## 2019-05-06 DIAGNOSIS — F3181 Bipolar II disorder: Secondary | ICD-10-CM | POA: Diagnosis not present

## 2019-05-06 NOTE — Patient Instructions (Signed)
Vitamin D at least 4000 units daily  Zinc 50 mg twice daily with food 

## 2019-05-06 NOTE — Progress Notes (Signed)
Edwin [name] [id number] 04-07-1956 63 y.o.  Subjective:   Patient ID:  Edwin [name] is a 63 y.o. (DOB [birth year]) male.  Chief Complaint:  Chief Complaint  Patient presents with  . Follow-up    Medication Management  . Depression    Medication Management  . Other    Bipolar    Depression        Associated symptoms include no decreased concentration and no suicidal ideas.  Edwin [name] presents to the office today for follow-up of bipolar depression and anxiety.  Last seen January 15, 2019.  No meds were changed.  Patient was doing well.  Overall pretty well. 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  Mood has been pretty good.   Still taking pramipexole and it's helped mood but he thinks it's caused weight gain.  Beginning to get it under control.  He needs the pramipexole for mood.    Episode of PTSD triggered by sister who's terrified from Covid.  Went to see mother with sister who has been odd over this.  Sister won't see him until there is a vaccine.  Gave him a sense of loss bc she's the only family left except mother 34 yo and feeble.  Felt tremendous sudden loss and rejection.  Sister is [name].  Don't understand her reaction.  Lasted 4-5 days.  Didn't act on it.    Otherwise, Patient reports stable mood and denies depressed or irritable moods.  Patient denies any recent difficulty with anxiety.  Patient denies difficulty with sleep initiation or maintenance. Denies appetite disturbance.  Patient reports that energy and motivation have been good.  Patient denies any difficulty with concentration.  Patient denies any suicidal ideation.  Still concerned about weight and blames meds and wonders about weight loss meds and supplements.  Asked about L-glutamine taken it for a week.  Trying to watch diet.  Hasn't weighed himself.  Past Psychiatric Medication Trials: Lithium increased Cr, Wellbutrin NR, Vraylar tremor, Rexulti SE, Abilify increased  weight, lamotrigine, gabapentin, phentermine  Review of Systems:  Review of Systems  Neurological: Negative for tremors and weakness.  Psychiatric/Behavioral: Positive for depression. Negative for agitation, behavioral problems, confusion, decreased concentration, hallucinations, self-injury, sleep disturbance and suicidal ideas. The patient is not hyperactive.    Medications: I have reviewed the patient's current medications.  Current Outpatient Medications  Medication Sig Dispense Refill  . amLODipine (NORVASC) 2.5 MG tablet Take 1 tablet by mouth daily.  10  . Cholecalciferol 1000 units TBDP Take 1 tablet by mouth daily.     . divalproex (DEPAKOTE ER) 500 MG 24 hr tablet TAKE 2 TABLETS AT BEDTIME. 60 tablet 3  . DULoxetine (CYMBALTA) 60 MG capsule TAKE 2 CAPSULES DAILY. 60 capsule 3  . febuxostat (ULORIC) 40 MG tablet Take 40 mg by mouth daily.    . Glutamine 500 MG TABS Take 500 mg by mouth 2 (two) times daily.    [name] lamoTRIgine (LAMICTAL) 200 MG tablet TAKE 1 TABLET IN THE MORNING. 30 tablet 3  . LevOCARNitine (L-CARNITINE) 500 MG TABS Take 500 mg by mouth 2 (two) times daily.    [name] losartan (COZAAR) 50 MG tablet Take 1 tablet (50 mg total) by mouth daily. 30 tablet 0  . pramipexole (MIRAPEX) 0.5 MG tablet TAKE 1 TABLET BY MOUTH TWICE DAILY. 60 tablet 3  . psyllium (METAMUCIL) 58.6 % powder Take 1 packet by mouth daily.      No current facility-administered medications  for this visit.     Medication Side Effects: Other: weight gain.  Allergies:  Allergies  Allergen Reactions  . Allopurinol Other (See Comments)    Made pt emotionally unstable   . Penicillins Hives and Other (See Comments)    Has patient had a PCN reaction causing immediate rash, facial/tongue/throat swelling, SOB or lightheadedness with hypotension: No Has patient had a PCN reaction causing severe rash involving mucus membranes or skin necrosis: No Has patient had a PCN reaction that required hospitalization  No Has patient had a PCN reaction occurring within the last 10 years: No If all of the above answers are "NO", then may proceed with Cephalosporin use.  . Aspirin Hives  . Ciprofloxacin Rash    Past Medical History:  Diagnosis Date  . Allergic rhinitis   . Bipolar disorder ([place])   . 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 CKD (chronic kidney disease) stage 2, GFR 60-89 ml/min   . Gout   . Hypertension   . Mild renal insufficiency    , with creatinine of 1.3 in 2010, was side effect of med  . Obesity    ,moderate  . Prostatitis    , Episodic  . Suicide attempt ([place]) 09/20/2014    Family History  Problem Relation Age of Onset  . Hypertension Father   . Gout Father   . Alzheimer's disease Father   . Other Mother        Viral Meningitis  . Mitral valve prolapse Mother   . Arrhythmia Mother   . Hypothyroidism Mother     Social History   Socioeconomic History  . Marital status: Single    Spouse name: Not on file  . Number of children: Not on file  . Years of education: Not on file  . Highest education level: Not on file  Occupational History  . Not on file  Social Needs  . Financial resource strain: Not on file  . Food insecurity    Worry: Not on file    Inability: Not on file  . Transportation needs    Medical: Not on file    Non-medical: Not on file  Tobacco Use  . Smoking status: Never Smoker  . Smokeless tobacco: Never Used  Substance and Sexual Activity  . Alcohol use: No  . Drug use: No  . Sexual activity: Not on file  Lifestyle  . Physical activity    Days per week: Not on file    Minutes per session: Not on file  . Stress: Not on file  Relationships  . Social Herbalist on phone: Not on file    Gets together: Not on file    Attends religious service: Not on file    Active member of club or organization: Not on file    Attends meetings of clubs or organizations: Not on file    Relationship status: Not on file  . Intimate partner  violence    Fear of current or ex partner: Not on file    Emotionally abused: Not on file    Physically abused: Not on file    Forced sexual activity: Not on file  Other Topics Concern  . Not on file  Social History Narrative  . Not on file    Past Medical History, Surgical history, Social history, and Family history were reviewed and updated as appropriate.   Please see review of systems for further details on the patient's review from today.  Objective:   Physical Exam:  There were no vitals taken for this visit.  Physical Exam Constitutional:      General: He is not in acute distress.    Appearance: He is well-developed.  Musculoskeletal:        General: No deformity.  Neurological:     Mental Status: He is alert and oriented to person, place, and time.     Motor: No tremor.     Coordination: Coordination normal.     Gait: Gait normal.  Psychiatric:        Attention and Perception: Attention normal. He is attentive.        Mood and Affect: Mood normal. Mood is not anxious or depressed. Affect is not labile, blunt, angry or inappropriate.        Speech: Speech normal.        Behavior: Behavior normal.        Thought Content: Thought content normal. Thought content does not include homicidal or suicidal ideation. Thought content does not include homicidal or suicidal plan.        Cognition and Memory: Cognition normal.        Judgment: Judgment normal.     Comments: Insight is good.     Lab Review:     Component Value Date/Time   NA 140 02/04/2017 0525   K 4.1 02/04/2017 0525   CL 105 02/04/2017 0525   CO2 27 02/04/2017 0525   GLUCOSE 120 (H) 02/04/2017 0525   BUN 10 02/04/2017 0525   CREATININE 1.07 02/04/2017 0525   CALCIUM 9.2 02/04/2017 0525   PROT 6.9 02/01/2017 0152   ALBUMIN 3.8 02/01/2017 0152   AST 21 02/01/2017 0152   ALT 27 02/01/2017 0152   ALKPHOS 56 02/01/2017 0152   BILITOT 0.6 02/01/2017 0152   GFRNONAA >60 02/04/2017 0525   GFRAA >60  02/04/2017 0525       Component Value Date/Time   WBC 7.4 02/03/2017 0527   RBC 3.48 (L) 02/03/2017 0527   HGB 10.8 (L) 02/03/2017 0527   HCT 31.1 (L) 02/03/2017 0527   PLT 253 02/03/2017 0527   MCV 89.4 02/03/2017 0527   MCH 31.0 02/03/2017 0527   MCHC 34.7 02/03/2017 0527   RDW 13.6 02/03/2017 0527   LYMPHSABS 1.7 02/01/2017 0205   MONOABS 2.2 (H) 02/01/2017 0205   EOSABS 0.1 02/01/2017 0205   BASOSABS 0.2 (H) 02/01/2017 0205    No results [name] for: POCLITH, LITHIUM   Lab Results  Component Value Date   VALPROATE <10.0 (L) 09/22/2014     .res Assessment: Plan:    Casimiro NeedleMichael was seen today for follow-up, depression and other.  Diagnoses and all orders for this visit:  Bipolar II disorder (HCC)  PTSD (post-traumatic stress disorder)  Obstructive sleep apnea  Supportive therapy dealing with sister and her phobia of Covid. Answered questions about Covid.  Disc ways to reduce risk but still live life.  Recommend vitamin D and Zinc  Good response to the med combination.  Polypharmacy necessary.    Disc weight loss strategies.  Using CPAP consistently and he requires less sleep.   No med changes indicated.  25 min appt  FU 4 mos  Meredith Staggersarey Cottle, MD, DFAPA   Please see After Visit Summary for patient specific instructions.  Future Appointments  Date Time Provider Department Center  06/08/2019  8:00 AM Jake BatheSkains, Mark C, MD [facility]    No orders of the defined types were placed in this encounter.     -------------------------------

## 2019-05-14 DIAGNOSIS — E291 Testicular hypofunction: Secondary | ICD-10-CM | POA: Diagnosis not present

## 2019-05-20 DIAGNOSIS — L03114 Cellulitis of left upper limb: Secondary | ICD-10-CM | POA: Diagnosis not present

## 2019-05-25 DIAGNOSIS — S50819A Abrasion of unspecified forearm, initial encounter: Secondary | ICD-10-CM | POA: Diagnosis not present

## 2019-05-28 DIAGNOSIS — E291 Testicular hypofunction: Secondary | ICD-10-CM | POA: Diagnosis not present

## 2019-06-05 ENCOUNTER — Telehealth: Payer: Self-pay | Admitting: Cardiology

## 2019-06-05 NOTE — Telephone Encounter (Signed)
New Message ° ° ° °Left message to confirm appt and answer covid questions  °

## 2019-06-05 NOTE — Telephone Encounter (Signed)

## 2019-06-08 ENCOUNTER — Encounter: Payer: Self-pay | Admitting: Cardiology

## 2019-06-08 ENCOUNTER — Other Ambulatory Visit: Payer: Self-pay

## 2019-06-08 ENCOUNTER — Ambulatory Visit (INDEPENDENT_AMBULATORY_CARE_PROVIDER_SITE_OTHER): Payer: BC Managed Care – PPO | Admitting: Cardiology

## 2019-06-08 VITALS — BP 140/80 | HR 62 | Ht 71.0 in | Wt 262.6 lb

## 2019-06-08 DIAGNOSIS — I4589 Other specified conduction disorders: Secondary | ICD-10-CM

## 2019-06-08 DIAGNOSIS — I10 Essential (primary) hypertension: Secondary | ICD-10-CM | POA: Diagnosis not present

## 2019-06-08 NOTE — Patient Instructions (Signed)
Medication Instructions:  The current medical regimen is effective;  continue present plan and medications.  If you need a refill on your cardiac medications before your next appointment, please call your pharmacy.   Follow-Up: At [facility], you and your health needs are our priority.  As part of our continuing mission to provide you with exceptional heart care, we have created designated Provider Care Teams.  These Care Teams include your primary Cardiologist (physician) and Advanced Practice Providers (APPs -  Physician Assistants and Nurse Practitioners) who all work together to provide you with the care you need, when you need it. You will need a follow up appointment in 12 months.  Please call our office 2 months in advance to schedule this appointment.  You may see Dr Mark Skains. or one of the following Advanced Practice Providers on your designated Care Team:   Lori Gerhardt, NP Laura Ingold, NP . Jill McDaniel, NP  Thank you for choosing [facility]!!      

## 2019-06-08 NOTE — Progress Notes (Signed)
[facility] [address], [address], [place]  [address] Phone: [phone] Fax:  [phone]  Date:  06/08/2019   ID:  Edwin [name], DOB 1956/09/27, MRN [id number]  PCP:  Josetta Huddle, MD   History of Present Illness: Edwin [name] is a 63 y.o. 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 Diltiazem, which was used for high blood pressure, was discontinued and he continued to have slow heart rates. A cardiac catheterization was performed in 2012 to exclude ischemic origin for his A-V dissociation. This was reassuring. EF was normal.  Felt very well on Depakote. More spontaneous. He feels better than he did on the lithium. I'm very pleased with this. Dr. Clovis Pu must be also.  He's not having any dizziness, syncope.Heart rate today is in the 50-70's..  02/01/16 -during piano lessions feels SOB random. ?anxiety. No syncope.   02/12/16  - No SOB, no syncope. [employer], doubled. Mother Edwin [name], CVA years ago. Was in hospital in 02/04/17 with ileus. Prior appendectomy. Overall he is just resuming his medications. Feels much better. No surgery.  02/06/18  - Has gained more weight. No CP, no SHOB. No syncope, no bleeding. With his piano lessons, he "lives on [place] time": He says Mirapex is leading to gain of weight.   06/08/2019  -Overall been doing quite well.  No fevers chills nausea vomiting syncope bleeding.  Medications reviewed.  Continues to gain weight.  We discussed at length.  He is excited because he met someone from the [place].  His mother is in a nursing home.  Wt Readings from Last 3 Encounters:  06/08/19 262 lb 9.6 oz (119.1 kg)  02/06/18 247 lb 12.8 oz (112.4 kg)  09/11/17 235 lb (106.6 kg)     Past Medical History:  Diagnosis Date  . Allergic rhinitis   . Bipolar disorder ([place])   . 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 CKD (chronic kidney disease) stage 2, GFR 60-89 ml/min   .  Gout   . Hypertension   . Mild renal insufficiency    , with creatinine of 1.3 in 2010, was side effect of med  . Obesity    ,moderate  . Prostatitis    , Episodic  . Suicide attempt ([place]) 09/20/2014    Past Surgical History:  Procedure Laterality Date  . APPENDECTOMY    . CATARACT EXTRACTION Right   . COLONOSCOPY WITH PROPOFOL N/A 06/14/2014   Procedure: COLONOSCOPY WITH PROPOFOL;  Surgeon: Garlan Fair, MD;  Location: [facility];  Service: Endoscopy;  Laterality: N/A;  . TONSILLECTOMY    . UMBILICAL HERNIA REPAIR      Current Outpatient Medications  Medication Sig Dispense Refill  . amLODipine (NORVASC) 2.5 MG tablet Take 1 tablet by mouth daily.  10  . Cholecalciferol 1000 units TBDP Take 1 tablet by mouth daily.     . divalproex (DEPAKOTE ER) 500 MG 24 hr tablet TAKE 2 TABLETS AT BEDTIME. 60 tablet 3  . DULoxetine (CYMBALTA) 60 MG capsule TAKE 2 CAPSULES DAILY. 60 capsule 3  . febuxostat (ULORIC) 40 MG tablet Take 40 mg by mouth daily.    [name] lamoTRIgine (LAMICTAL) 200 MG tablet TAKE 1 TABLET IN THE MORNING. 30 tablet 3  . LevOCARNitine (L-CARNITINE) 500 MG TABS Take 500 mg by mouth 2 (two) times daily.    [name] losartan (COZAAR) 50 MG tablet Take 1 tablet (50  mg total) by mouth daily. 30 tablet 0  . pramipexole (MIRAPEX) 0.5 MG tablet TAKE 1 TABLET BY MOUTH TWICE DAILY. 60 tablet 3  . psyllium (METAMUCIL) 58.6 % powder Take 1 packet by mouth daily.      No current facility-administered medications for this visit.     Allergies:    Allergies  Allergen Reactions  . Allopurinol Other (See Comments)    Made pt emotionally unstable   . Penicillins Hives and Other (See Comments)    Has patient had a PCN reaction causing immediate rash, facial/tongue/throat swelling, SOB or lightheadedness with hypotension: No Has patient had a PCN reaction causing severe rash involving mucus membranes or skin necrosis: No Has patient had a PCN reaction that required hospitalization No Has  patient had a PCN reaction occurring within the last 10 years: No If all of the above answers are "NO", then may proceed with Cephalosporin use.  . Aspirin Hives  . Ciprofloxacin Rash    Social History:  The patient  reports that he has never smoked. He has never used smokeless tobacco. He reports that he does not drink alcohol or use drugs.   ROS:  Please see the history of present illness.   No fevers chills nausea vomiting syncope bleeding  PHYSICAL EXAM: VS:  BP 140/80   Pulse 62   Ht '5\' 11"'  (1.803 m)   Wt 262 lb 9.6 oz (119.1 kg)   SpO2 96%   BMI 36.63 kg/m  GEN: Well nourished, well developed, in no acute distress obese HEENT: normal  Neck: no JVD, carotid bruits, or masses Cardiac: RRR; no murmurs, rubs, or gallops,no edema  Respiratory:  clear to auscultation bilaterally, normal work of breathing GI: soft, nontender, nondistended, + BS MS: no deformity or atrophy  Skin: warm and dry, no rash Neuro:  Alert and Oriented x 3, Strength and sensation are intact Psych: euthymic mood, full affect   EKG: EKG was performed today 06/08/2019-normal sinus rhythm 62 with nonspecific ST-T wave changes.  Heart rate was 62 bpm, interventricular conduction delay borderline.  QRS interval 108 ms.  02/06/18 - NSR bif block. Personally viewed. 02/11/17-sinus rhythm 84 with vertical axis, nonspecific ST-T wave changes personally viewed no significant change from prior- EKG ordered today. 02/01/16-sinus bradycardia 55, vertical axis, no change from prior. Personally viewed. Heart rate slightly increased. 11/26/14-sinus bradycardia rate 58, subtle nonspecific ST changes, vertical axis-prior Sinus bradycardia with sinus arrhythmia, heart rate 48 beats per minute. Possible old septal infarct pattern.     ASSESSMENT AND PLAN:  Previous A-V dissociation.   - Doing well. Asymptomatic bradycardia. No changes made in medications. He is feeling much better. Has not had any further occurrences. No adverse  arrhythmias during prior ileus hospitalization.  Overall maintains his asymptomatic status.  No electrical anomalies noted.  Continue with monitoring.  Hypertension  -Great job with blood pressures overall.  Continue with exercise diet medications.  Obesity  -Continue to work on weight loss. Decrease carbs. Meds side effects.  He has been buying no sugar ice cream.  Prevention -LDL 92 HDL 33 triglycerides 98 in 2019.  Creatinine 1.29 in 2020.  One-year follow-up, he would like to continue to maintain visits given his prior electrical abnormality.  Signed, Candee Furbish, MD [facility]  06/08/2019 8:34 AM

## 2019-06-09 NOTE — Telephone Encounter (Signed)
I left a detailed VM about AV dissociation in the setting of lithium and how this should not return now that he is off of the medication. Reassurance.   Candee Furbish, MD

## 2019-06-10 DIAGNOSIS — G4733 Obstructive sleep apnea (adult) (pediatric): Secondary | ICD-10-CM | POA: Diagnosis not present

## 2019-06-11 DIAGNOSIS — E291 Testicular hypofunction: Secondary | ICD-10-CM | POA: Diagnosis not present

## 2019-06-16 ENCOUNTER — Other Ambulatory Visit: Payer: Self-pay | Admitting: Psychiatry

## 2019-06-17 DIAGNOSIS — R600 Localized edema: Secondary | ICD-10-CM | POA: Diagnosis not present

## 2019-06-19 DIAGNOSIS — R600 Localized edema: Secondary | ICD-10-CM | POA: Diagnosis not present

## 2019-06-25 DIAGNOSIS — E291 Testicular hypofunction: Secondary | ICD-10-CM | POA: Diagnosis not present

## 2019-06-26 DIAGNOSIS — M1009 Idiopathic gout, multiple sites: Secondary | ICD-10-CM | POA: Diagnosis not present

## 2019-06-26 DIAGNOSIS — Z79899 Other long term (current) drug therapy: Secondary | ICD-10-CM | POA: Diagnosis not present

## 2019-07-02 ENCOUNTER — Encounter: Payer: Self-pay | Admitting: Psychiatry

## 2019-07-02 ENCOUNTER — Other Ambulatory Visit: Payer: Self-pay | Admitting: Psychiatry

## 2019-07-02 ENCOUNTER — Other Ambulatory Visit: Payer: Self-pay

## 2019-07-02 ENCOUNTER — Ambulatory Visit (INDEPENDENT_AMBULATORY_CARE_PROVIDER_SITE_OTHER): Payer: BC Managed Care – PPO | Admitting: Psychiatry

## 2019-07-02 DIAGNOSIS — F4310 Post-traumatic stress disorder, unspecified: Secondary | ICD-10-CM

## 2019-07-02 DIAGNOSIS — F3181 Bipolar II disorder: Secondary | ICD-10-CM

## 2019-07-02 DIAGNOSIS — G4733 Obstructive sleep apnea (adult) (pediatric): Secondary | ICD-10-CM | POA: Diagnosis not present
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
## 2019-07-03 DIAGNOSIS — R600 Localized edema: Secondary | ICD-10-CM | POA: Diagnosis not present

## 2019-07-07 ENCOUNTER — Ambulatory Visit: Payer: Self-pay | Admitting: Cardiology

## 2019-07-09 DIAGNOSIS — E291 Testicular hypofunction: Secondary | ICD-10-CM | POA: Diagnosis not present

## 2019-07-13 DIAGNOSIS — R600 Localized edema: Secondary | ICD-10-CM | POA: Diagnosis not present

## 2019-07-13 DIAGNOSIS — R739 Hyperglycemia, unspecified: Secondary | ICD-10-CM | POA: Diagnosis not present

## 2019-07-23 DIAGNOSIS — Z23 Encounter for immunization: Secondary | ICD-10-CM | POA: Diagnosis not present

## 2019-07-23 DIAGNOSIS — E291 Testicular hypofunction: Secondary | ICD-10-CM | POA: Diagnosis not present

## 2019-07-30 DIAGNOSIS — B078 Other viral warts: Secondary | ICD-10-CM | POA: Diagnosis not present

## 2019-08-06 DIAGNOSIS — E291 Testicular hypofunction: Secondary | ICD-10-CM | POA: Diagnosis not present

## 2019-08-21 DIAGNOSIS — E291 Testicular hypofunction: Secondary | ICD-10-CM | POA: Diagnosis not present

## 2019-08-31 DIAGNOSIS — L820 Inflamed seborrheic keratosis: Secondary | ICD-10-CM | POA: Diagnosis not present

## 2019-08-31 DIAGNOSIS — C44319 Basal cell carcinoma of skin of other parts of face: Secondary | ICD-10-CM | POA: Diagnosis not present

## 2019-08-31 DIAGNOSIS — D485 Neoplasm of uncertain behavior of skin: Secondary | ICD-10-CM | POA: Diagnosis not present

## 2019-09-04 DIAGNOSIS — E291 Testicular hypofunction: Secondary | ICD-10-CM | POA: Diagnosis not present

## 2019-09-08 DIAGNOSIS — C44319 Basal cell carcinoma of skin of other parts of face: Secondary | ICD-10-CM | POA: Diagnosis not present

## 2019-09-09 ENCOUNTER — Ambulatory Visit: Payer: BC Managed Care – PPO | Admitting: Psychiatry

## 2019-09-09 ENCOUNTER — Encounter

## 2019-09-15 DIAGNOSIS — Z0289 Encounter for other administrative examinations: Secondary | ICD-10-CM

## 2019-09-15 DIAGNOSIS — G4700 Insomnia, unspecified: Secondary | ICD-10-CM | POA: Diagnosis not present

## 2019-09-18 DIAGNOSIS — E291 Testicular hypofunction: Secondary | ICD-10-CM | POA: Diagnosis not present

## 2019-09-22 ENCOUNTER — Ambulatory Visit (INDEPENDENT_AMBULATORY_CARE_PROVIDER_SITE_OTHER): Payer: BC Managed Care – PPO | Admitting: Psychiatry

## 2019-09-22 ENCOUNTER — Other Ambulatory Visit: Payer: Self-pay

## 2019-09-22 ENCOUNTER — Encounter: Payer: Self-pay | Admitting: Psychiatry

## 2019-09-22 DIAGNOSIS — G4733 Obstructive sleep apnea (adult) (pediatric): Secondary | ICD-10-CM | POA: Diagnosis not present

## 2019-09-22 DIAGNOSIS — F4310 Post-traumatic stress disorder, unspecified: Secondary | ICD-10-CM

## 2019-09-22 DIAGNOSIS — F3181 Bipolar II disorder: Secondary | ICD-10-CM | POA: Diagnosis not present

## 2019-09-22 NOTE — Progress Notes (Signed)
Edwin [name] [id number] 1956-07-31 63 y.o.  Subjective:   Patient ID:  Edwin [name] is a 63 y.o. (DOB Aug 16, 1956) male.  Chief Complaint:  Chief Complaint  Patient presents with  . Follow-up    Medication Management  . Other    Bipolar 1    Depression        Associated symptoms include no decreased concentration and no suicidal ideas.  [name] presents to the office today for follow-up of bipolar depression and anxiety.  Last seen July 02, 2019.  No meds were changed.  Patient was doing well.  He asked me to write a letter to his attorney and support his intention to marry a woman from [place] and that his mental health was sufficiently well that he would not represent a danger to his fiance.  This was discussed with his attorney and the letter was written.   Met girl June 8. Met her on dating site international.  [place] woman and decided wanted to get married.  Options opening up after being shut down by Covid.  Disc stress Covid limiting access.    Still Overall pretty well. 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   Mood has been pretty good.   Still taking pramipexole and it's helped mood but he thinks it's caused weight gain.  Beginning to get it under control.  He needs the pramipexole for mood.    Otherwise, Patient reports stable mood and denies depressed or irritable moods.  Patient denies any recent difficulty with anxiety.  Patient denies difficulty with sleep initiation or maintenance.  Was down to 4-5 hours nightly and called sleep doc Maxwell Caul. Sleep hygiene is better now and that' s helped.   Denies appetite disturbance.  Patient reports that energy and motivation have been good.  Patient denies any difficulty with concentration.  Patient denies any suicidal ideation.  Still concerned about weight and blames meds and wonders about weight loss meds and supplements.   Asked about L-glutamine taken it for a week.  Trying to watch diet.  Hasn't weighed himself.  M died Jul 26, 2019.  Was a relief for her.    Past Psychiatric Medication Trials: Lithium increased Cr, Wellbutrin NR, Vraylar tremor, Rexulti SE, Abilify increased weight, lamotrigine, gabapentin, phentermine  Review of Systems:  Review of Systems  Neurological: Negative for tremors and weakness.  Psychiatric/Behavioral: Positive for depression. Negative for agitation, behavioral problems, confusion, decreased concentration, hallucinations, self-injury, sleep disturbance and suicidal ideas. The patient is not hyperactive.   No longer depressed.   Medications: I have reviewed the patient's current medications.  Current Outpatient Medications  Medication Sig Dispense Refill  . Cholecalciferol 1000 units TBDP Take 1 tablet by mouth daily.     . divalproex (DEPAKOTE ER) 500 MG 24 hr tablet TAKE 2 TABLETS AT BEDTIME. 60 tablet 5  . DULoxetine (CYMBALTA) 60 MG capsule TAKE 2 CAPSULES DAILY. 60 capsule 5  . febuxostat (ULORIC) 40 MG tablet Take 40 mg by mouth daily.    . furosemide (LASIX) 20 MG tablet     . lamoTRIgine (LAMICTAL) 200 MG tablet TAKE 1 TABLET IN THE MORNING. 30 tablet 5  . LevOCARNitine (L-CARNITINE) 500 MG TABS Take 500 mg by mouth 2 (two) times daily.    [name] losartan (COZAAR) 50 MG tablet Take 1 tablet (50 mg total) by mouth daily. 30 tablet 0  . Multiple Vitamins-Minerals (CENTRUM  ADULTS) TABS Take by mouth.    . multivitamin-lutein (OCUVITE-LUTEIN) CAPS capsule Take 1 capsule by mouth daily.    . pramipexole (MIRAPEX) 0.5 MG tablet TAKE 1 TABLET BY MOUTH TWICE DAILY. 60 tablet 5  . psyllium (METAMUCIL) 58.6 % powder Take 1 packet by mouth daily.     . Turmeric (QC TUMERIC COMPLEX) 500 MG CAPS Take by mouth.    . zinc gluconate 50 MG tablet Take 50 mg by mouth daily.     No current facility-administered medications for this visit.     Medication Side Effects: Other: weight  gain.  Allergies:  Allergies  Allergen Reactions  . Allopurinol Other (See Comments)    Made pt emotionally unstable   . Penicillins Hives and Other (See Comments)    Has patient had a PCN reaction causing immediate rash, facial/tongue/throat swelling, SOB or lightheadedness with hypotension: No Has patient had a PCN reaction causing severe rash involving mucus membranes or skin necrosis: No Has patient had a PCN reaction that required hospitalization No Has patient had a PCN reaction occurring within the last 10 years: No If all of the above answers are "NO", then may proceed with Cephalosporin use.  . Aspirin Hives  . Ciprofloxacin Rash    Past Medical History:  Diagnosis Date  . Allergic rhinitis   . Bipolar disorder ([place])   . 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 CKD (chronic kidney disease) stage 2, GFR 60-89 ml/min   . Gout   . Hypertension   . Mild renal insufficiency    , with creatinine of 1.3 in 2010, was side effect of med  . Obesity    ,moderate  . Prostatitis    , Episodic  . Suicide attempt ([place]) 09/20/2014    Family History  Problem Relation Age of Onset  . Hypertension Father   . Gout Father   . Alzheimer's disease Father   . Other Mother        Viral Meningitis  . Mitral valve prolapse Mother   . Arrhythmia Mother   . Hypothyroidism Mother     Social History   Socioeconomic History  . Marital status: Single    Spouse name: Not on file  . Number of children: Not on file  . Years of education: Not on file  . Highest education level: Not on file  Occupational History  . Not on file  Social Needs  . Financial resource strain: Not on file  . Food insecurity    Worry: Not on file    Inability: Not on file  . Transportation needs    Medical: Not on file    Non-medical: Not on file  Tobacco Use  . Smoking status: Never Smoker  . Smokeless tobacco: Never Used  Substance and Sexual Activity  . Alcohol use: No  . Drug use: No  . Sexual  activity: Not on file  Lifestyle  . Physical activity    Days per week: Not on file    Minutes per session: Not on file  . Stress: Not on file  Relationships  . Social Herbalist on phone: Not on file    Gets together: Not on file    Attends religious service: Not on file    Active member of club or organization: Not on file    Attends meetings of clubs or organizations: Not on file    Relationship status: Not on file  . Intimate partner violence  Fear of current or ex partner: Not on file    Emotionally abused: Not on file    Physically abused: Not on file    Forced sexual activity: Not on file  Other Topics Concern  . Not on file  Social History Narrative  . Not on file    Past Medical History, Surgical history, Social history, and Family history were reviewed and updated as appropriate.   Please see review of systems for further details on the patient's review from today.   Objective:   Physical Exam:  There were no vitals taken for this visit.  Physical Exam Constitutional:      General: He is not in acute distress.    Appearance: He is well-developed.  Musculoskeletal:        General: No deformity.  Neurological:     Mental Status: He is alert and oriented to person, place, and time.     Motor: No tremor.     Coordination: Coordination normal.     Gait: Gait normal.  Psychiatric:        Attention and Perception: Attention normal. He is attentive.        Mood and Affect: Mood is not anxious or depressed. Affect is not labile, blunt, angry or inappropriate.        Speech: Speech normal.        Behavior: Behavior normal.        Thought Content: Thought content normal. Thought content does not include homicidal or suicidal ideation. Thought content does not include homicidal or suicidal plan.        Cognition and Memory: Cognition normal.        Judgment: Judgment normal.     Comments: Insight is good.  Some situational anxiety     Lab Review:      Component Value Date/Time   NA 140 02/04/2017 0525   K 4.1 02/04/2017 0525   CL 105 02/04/2017 0525   CO2 27 02/04/2017 0525   GLUCOSE 120 (H) 02/04/2017 0525   BUN 10 02/04/2017 0525   CREATININE 1.07 02/04/2017 0525   CALCIUM 9.2 02/04/2017 0525   PROT 6.9 02/01/2017 0152   ALBUMIN 3.8 02/01/2017 0152   AST 21 02/01/2017 0152   ALT 27 02/01/2017 0152   ALKPHOS 56 02/01/2017 0152   BILITOT 0.6 02/01/2017 0152   GFRNONAA >60 02/04/2017 0525   GFRAA >60 02/04/2017 0525       Component Value Date/Time   WBC 7.4 02/03/2017 0527   RBC 3.48 (L) 02/03/2017 0527   HGB 10.8 (L) 02/03/2017 0527   HCT 31.1 (L) 02/03/2017 0527   PLT 253 02/03/2017 0527   MCV 89.4 02/03/2017 0527   MCH 31.0 02/03/2017 0527   MCHC 34.7 02/03/2017 0527   RDW 13.6 02/03/2017 0527   LYMPHSABS 1.7 02/01/2017 0205   MONOABS 2.2 (H) 02/01/2017 0205   EOSABS 0.1 02/01/2017 0205   BASOSABS 0.2 (H) 02/01/2017 0205    No results found for: POCLITH, LITHIUM   Lab Results  Component Value Date   VALPROATE <10.0 (L) 09/22/2014    Sleep study dx severe OSA AHI 53.   .res Assessment: Plan:    Fairley was seen today for follow-up and other.  Diagnoses and all orders for this visit:  Bipolar II disorder ([place])  PTSD (post-traumatic stress disorder)  Obstructive sleep apnea  Supportive therapy dealing with his go to get married.    Currently his bipolar disorder is stable. No concerns about meds.  Supportive therapy dealing with sister and her phobia of Covid. Answered questions about Covid.  Disc ways to reduce risk but still live life.  Recommend vitamin D and Zinc.  Disc it in detail.    Disc value of music for his mental health.   Good response to the med combination.  Polypharmacy necessary.   Since 2017 on pramipexole has been much better with regard to mood stability.  Disc weight loss strategies.  Using CPAP consistently and he requires less sleep.  Disc sleep hygiene and getting  adequate quantity for mental health.  Sleep deprivation increases risk mania.   No med changes indicated.  25 min appt  FU 4 mos  Lynder Parents, MD, DFAPA   Please see After Visit Summary for patient specific instructions.  No future appointments.  No orders of the defined types were placed in this encounter.     -------------------------------

## 2019-10-02 DIAGNOSIS — E291 Testicular hypofunction: Secondary | ICD-10-CM | POA: Diagnosis not present

## 2019-10-12 ENCOUNTER — Ambulatory Visit: Payer: BC Managed Care – PPO | Admitting: Psychiatry

## 2019-10-13 DIAGNOSIS — F332 Major depressive disorder, recurrent severe without psychotic features: Secondary | ICD-10-CM | POA: Diagnosis not present

## 2019-10-13 DIAGNOSIS — I10 Essential (primary) hypertension: Secondary | ICD-10-CM | POA: Diagnosis not present

## 2019-10-13 DIAGNOSIS — Z125 Encounter for screening for malignant neoplasm of prostate: Secondary | ICD-10-CM | POA: Diagnosis not present

## 2019-10-13 DIAGNOSIS — E782 Mixed hyperlipidemia: Secondary | ICD-10-CM | POA: Diagnosis not present

## 2019-10-13 DIAGNOSIS — M109 Gout, unspecified: Secondary | ICD-10-CM | POA: Diagnosis not present

## 2019-10-13 DIAGNOSIS — E291 Testicular hypofunction: Secondary | ICD-10-CM | POA: Diagnosis not present

## 2019-10-13 DIAGNOSIS — E559 Vitamin D deficiency, unspecified: Secondary | ICD-10-CM | POA: Diagnosis not present

## 2019-10-13 DIAGNOSIS — Z7189 Other specified counseling: Secondary | ICD-10-CM | POA: Diagnosis not present

## 2019-10-13 DIAGNOSIS — Z23 Encounter for immunization: Secondary | ICD-10-CM | POA: Diagnosis not present

## 2019-10-27 DIAGNOSIS — E291 Testicular hypofunction: Secondary | ICD-10-CM | POA: Diagnosis not present

## 2019-10-28 DIAGNOSIS — D225 Melanocytic nevi of trunk: Secondary | ICD-10-CM | POA: Diagnosis not present

## 2019-10-28 DIAGNOSIS — L820 Inflamed seborrheic keratosis: Secondary | ICD-10-CM | POA: Diagnosis not present

## 2019-10-28 DIAGNOSIS — D485 Neoplasm of uncertain behavior of skin: Secondary | ICD-10-CM | POA: Diagnosis not present

## 2019-10-28 DIAGNOSIS — B353 Tinea pedis: Secondary | ICD-10-CM | POA: Diagnosis not present

## 2019-10-28 DIAGNOSIS — Z85828 Personal history of other malignant neoplasm of skin: Secondary | ICD-10-CM | POA: Diagnosis not present

## 2019-10-28 DIAGNOSIS — L570 Actinic keratosis: Secondary | ICD-10-CM | POA: Diagnosis not present

## 2019-11-04 DIAGNOSIS — G4733 Obstructive sleep apnea (adult) (pediatric): Secondary | ICD-10-CM | POA: Diagnosis not present

## 2019-11-08 DIAGNOSIS — Z20828 Contact with and (suspected) exposure to other viral communicable diseases: Secondary | ICD-10-CM | POA: Diagnosis not present

## 2019-11-08 DIAGNOSIS — Z03818 Encounter for observation for suspected exposure to other biological agents ruled out: Secondary | ICD-10-CM | POA: Diagnosis not present

## 2019-11-10 DIAGNOSIS — E291 Testicular hypofunction: Secondary | ICD-10-CM | POA: Diagnosis not present

## 2019-11-24 DIAGNOSIS — E291 Testicular hypofunction: Secondary | ICD-10-CM | POA: Diagnosis not present

## 2019-11-24 DIAGNOSIS — J029 Acute pharyngitis, unspecified: Secondary | ICD-10-CM | POA: Diagnosis not present

## 2019-11-27 DIAGNOSIS — J029 Acute pharyngitis, unspecified: Secondary | ICD-10-CM | POA: Diagnosis not present

## 2019-12-08 DIAGNOSIS — E291 Testicular hypofunction: Secondary | ICD-10-CM | POA: Diagnosis not present

## 2019-12-18 ENCOUNTER — Other Ambulatory Visit: Payer: Self-pay | Admitting: Psychiatry

## 2019-12-28 DIAGNOSIS — M1009 Idiopathic gout, multiple sites: Secondary | ICD-10-CM | POA: Diagnosis not present

## 2020-01-01 DIAGNOSIS — E291 Testicular hypofunction: Secondary | ICD-10-CM | POA: Diagnosis not present

## 2020-01-04 ENCOUNTER — Other Ambulatory Visit: Payer: Self-pay | Admitting: Psychiatry

## 2020-01-15 DIAGNOSIS — E291 Testicular hypofunction: Secondary | ICD-10-CM | POA: Diagnosis not present

## 2020-01-20 ENCOUNTER — Ambulatory Visit (INDEPENDENT_AMBULATORY_CARE_PROVIDER_SITE_OTHER): Payer: BC Managed Care – PPO | Admitting: Psychiatry

## 2020-01-20 ENCOUNTER — Other Ambulatory Visit: Payer: Self-pay

## 2020-01-20 ENCOUNTER — Encounter: Payer: Self-pay | Admitting: Psychiatry

## 2020-01-20 DIAGNOSIS — F4310 Post-traumatic stress disorder, unspecified: Secondary | ICD-10-CM | POA: Diagnosis not present

## 2020-01-20 DIAGNOSIS — F3181 Bipolar II disorder: Secondary | ICD-10-CM

## 2020-01-20 DIAGNOSIS — R748 Abnormal levels of other serum enzymes: Secondary | ICD-10-CM

## 2020-01-20 DIAGNOSIS — G4733 Obstructive sleep apnea (adult) (pediatric): Secondary | ICD-10-CM

## 2020-01-20 NOTE — Progress Notes (Signed)
Edwin [name] [id number] 21-Sep-1956 64 y.o.  Subjective:   Patient ID:  Edwin [name] is a 64 y.o. (DOB [birth year]) male.  Chief Complaint:  Chief Complaint  Patient presents with  . Medication Problem  . Follow-up    bipolar disorder    Depression        Associated symptoms include no decreased concentration and no suicidal ideas.  Edwin [name] presents to the office today for follow-up of bipolar depression and anxiety.  seen July 02, 2019.  No meds were changed.  Patient was doing well.  He asked me to write a letter to his attorney and support his intention to marry a woman from [place] and that his mental health was sufficiently well that he would not represent a danger to his fiance.  This was discussed with his attorney and the letter was written.  Met girl June 8. Met her on dating site international.  [place] woman and decided wanted to get married.  Options opening up after being shut down by Covid.  Last seen November 2020.  No meds were changed.  He was overall doing well.  Wonders about elevated liver enzymes and Depakote.  Labs not visible in Epic.  PCP Dr. Gavin Pound is working up this finding.    Doing great from mental health.  Talks daily to GF in [place].    Still Overall pretty well. 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   Mood has been pretty good.   Still taking pramipexole and it's helped mood but he thinks it's caused weight gain.  Beginning to get it under control.  He needs the pramipexole for mood.    Otherwise, Patient reports stable mood and denies depressed or irritable moods.  Patient denies any recent difficulty with anxiety.  Patient denies difficulty with sleep initiation or maintenance.  Was down to 4-5 hours nightly and called sleep doc Edwin [name]. Sleep hygiene is better now and that' s helped.   Denies appetite disturbance.  Patient reports that  energy and motivation have been good.  Patient denies any difficulty with concentration.  Patient denies any suicidal ideation.  Still concerned about weight and blames meds and wonders about weight loss meds and supplements.  Asked about L-glutamine taken it for a week.  Trying to watch diet.  Hasn't weighed himself.  No alcohol.    M died 08/12/19.  Was a relief for her.    Past Psychiatric Medication Trials: Lithium increased Cr, Depakote, lamotrigine,  gabapentin, Vraylar tremor, Rexulti SE, Abilify increased weight,   Wellbutrin NR, phentermine,    Review of Systems:  Review of Systems  Musculoskeletal: Positive for arthralgias.  Neurological: Negative for tremors and weakness.  Psychiatric/Behavioral: Positive for depression. Negative for agitation, behavioral problems, confusion, decreased concentration, hallucinations, self-injury, sleep disturbance and suicidal ideas. The patient is not hyperactive.   No longer depressed.   Medications: I have reviewed the patient's current medications.  Current Outpatient Medications  Medication Sig Dispense Refill  . Cholecalciferol 1000 units TBDP Take 1 tablet by mouth daily.     . divalproex (DEPAKOTE ER) 500 MG 24 hr tablet TAKE 2 TABLETS AT BEDTIME. 60 tablet 2  . DULoxetine (CYMBALTA) 60 MG capsule TAKE 2 CAPSULES DAILY. 60 capsule 2  . febuxostat (ULORIC) 40 MG tablet Take 40 mg by mouth daily.    . furosemide (LASIX) 20 MG tablet     .  lamoTRIgine (LAMICTAL) 200 MG tablet TAKE 1 TABLET IN THE MORNING. 30 tablet 2  . LevOCARNitine (L-CARNITINE) 500 MG TABS Take 500 mg by mouth 2 (two) times daily.    [name] losartan (COZAAR) 50 MG tablet Take 1 tablet (50 mg total) by mouth daily. 30 tablet 0  . Multiple Vitamins-Minerals (CENTRUM ADULTS) TABS Take by mouth.    . multivitamin-lutein (OCUVITE-LUTEIN) CAPS capsule Take 1 capsule by mouth daily.    . pramipexole (MIRAPEX) 0.5 MG tablet TAKE 1 TABLET BY MOUTH TWICE DAILY. 60 tablet 1   . psyllium (METAMUCIL) 58.6 % powder Take 1 packet by mouth daily.     . Turmeric (QC TUMERIC COMPLEX) 500 MG CAPS Take by mouth.    . zinc gluconate 50 MG tablet Take 50 mg by mouth daily.     No current facility-administered medications for this visit.    Medication Side Effects: Other: weight gain.  Allergies:  Allergies  Allergen Reactions  . Allopurinol Other (See Comments)    Made pt emotionally unstable   . Penicillins Hives and Other (See Comments)    Has patient had a PCN reaction causing immediate rash, facial/tongue/throat swelling, SOB or lightheadedness with hypotension: No Has patient had a PCN reaction causing severe rash involving mucus membranes or skin necrosis: No Has patient had a PCN reaction that required hospitalization No Has patient had a PCN reaction occurring within the last 10 years: No If all of the above answers are "NO", then may proceed with Cephalosporin use.  . Aspirin Hives  . Ciprofloxacin Rash    Past Medical History:  Diagnosis Date  . Allergic rhinitis   . Bipolar disorder ([place])   . 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 CKD (chronic kidney disease) stage 2, GFR 60-89 ml/min   . Gout   . Hypertension   . Mild renal insufficiency    , with creatinine of 1.3 in 2010, was side effect of med  . Obesity    ,moderate  . Prostatitis    , Episodic  . Suicide attempt ([place]) 09/20/2014    Family History  Problem Relation Age of Onset  . Hypertension Father   . Gout Father   . Alzheimer's disease Father   . Other Mother        Viral Meningitis  . Mitral valve prolapse Mother   . Arrhythmia Mother   . Hypothyroidism Mother     Social History   Socioeconomic History  . Marital status: Single    Spouse name: Not on file  . Number of children: Not on file  . Years of education: Not on file  . Highest education level: Not on file  Occupational History  . Not on file  Tobacco Use  . Smoking status: Never Smoker  . Smokeless  tobacco: Never Used  Substance and Sexual Activity  . Alcohol use: No  . Drug use: No  . Sexual activity: Not on file  Other Topics Concern  . Not on file  Social History Narrative  . Not on file   Social Determinants of Health   Financial Resource Strain:   . Difficulty of Paying Living Expenses: Not on file  Food Insecurity:   . Worried About Charity fundraiser in the Last Year: Not on file  . Ran Out of Food in the Last Year: Not on file  Transportation Needs:   . Lack of Transportation (Medical): Not on file  . Lack of Transportation (Non-Medical): Not  on file  Physical Activity:   . Days of Exercise per Week: Not on file  . Minutes of Exercise per Session: Not on file  Stress:   . Feeling of Stress : Not on file  Social Connections:   . Frequency of Communication with Friends and Family: Not on file  . Frequency of Social Gatherings with Friends and Family: Not on file  . Attends Religious Services: Not on file  . Active Member of Clubs or Organizations: Not on file  . Attends Archivist Meetings: Not on file  . Marital Status: Not on file  Intimate Partner Violence:   . Fear of Current or Ex-Partner: Not on file  . Emotionally Abused: Not on file  . Physically Abused: Not on file  . Sexually Abused: Not on file    Past Medical History, Surgical history, Social history, and Family history were reviewed and updated as appropriate.   Please see review of systems for further details on the patient's review from today.   Objective:   Physical Exam:  There were no vitals taken for this visit.  Physical Exam Constitutional:      General: He is not in acute distress.    Appearance: He is well-developed.  Musculoskeletal:        General: No deformity.  Neurological:     Mental Status: He is alert and oriented to person, place, and time.     Motor: No tremor.     Coordination: Coordination normal.     Gait: Gait normal.  Psychiatric:        Attention  and Perception: Attention normal. He is attentive.        Mood and Affect: Mood is not anxious or depressed. Affect is not labile, blunt, angry or inappropriate.        Speech: Speech normal.        Behavior: Behavior normal.        Thought Content: Thought content normal. Thought content does not include homicidal or suicidal ideation. Thought content does not include homicidal or suicidal plan.        Cognition and Memory: Cognition normal.        Judgment: Judgment normal.     Comments: Insight is good.  Some situational anxiety managed     Lab Review:     Component Value Date/Time   NA 140 02/04/2017 0525   K 4.1 02/04/2017 0525   CL 105 02/04/2017 0525   CO2 27 02/04/2017 0525   GLUCOSE 120 (H) 02/04/2017 0525   BUN 10 02/04/2017 0525   CREATININE 1.07 02/04/2017 0525   CALCIUM 9.2 02/04/2017 0525   PROT 6.9 02/01/2017 0152   ALBUMIN 3.8 02/01/2017 0152   AST 21 02/01/2017 0152   ALT 27 02/01/2017 0152   ALKPHOS 56 02/01/2017 0152   BILITOT 0.6 02/01/2017 0152   GFRNONAA >60 02/04/2017 0525   GFRAA >60 02/04/2017 0525       Component Value Date/Time   WBC 7.4 02/03/2017 0527   RBC 3.48 (L) 02/03/2017 0527   HGB 10.8 (L) 02/03/2017 0527   HCT 31.1 (L) 02/03/2017 0527   PLT 253 02/03/2017 0527   MCV 89.4 02/03/2017 0527   MCH 31.0 02/03/2017 0527   MCHC 34.7 02/03/2017 0527   RDW 13.6 02/03/2017 0527   LYMPHSABS 1.7 02/01/2017 0205   MONOABS 2.2 (H) 02/01/2017 0205   EOSABS 0.1 02/01/2017 0205   BASOSABS 0.2 (H) 02/01/2017 0205    No results found for:  POCLITH, LITHIUM   Lab Results  Component Value Date   VALPROATE <10.0 (L) 09/22/2014    Sleep study dx severe OSA AHI 53.   .res Assessment: Plan:    Edwin [name] was seen today for medication problem and follow-up.  Diagnoses and all orders for this visit:  Bipolar II disorder ([place])  PTSD (post-traumatic stress disorder)  Obstructive sleep apnea  Elevated liver enzymes  Greater than 50% of 40 min  face to face time with patient was spent on counseling and coordination of care. We discussed  Mood remains stable but concerns over elevated liver enzymes.  Labs not available.  Switch from lithium to VPA in 2012 was followed up with CMP which was normal at the time except for elevated ammonia level.  Which was the reason for adding L-carnitine and this corrected the ammonia level.  Of his current meds the most likely potential cause of elevated liver enzymes would be VPA but of course there are other potential causes such as fatty liver, etc.  He wonders about switching off Depakote.  However Depakote is the primary mood stabilizer that is preventing mania.  If we stop Depakote he will have to be switched to another mood stabilizer.  The only nonantipsychotic mood stabilizer left would be carbamazepine and that causes multiple drug interaction issues.  Switching to an antipsychotic type mood stabilizer could render the off label use of the pramipexole ineffective posing a risk of relapse of depression as well as the typical potential side effects of antipsychotics.  Therefore it would be preferable not to switch off Depakote unless there is no other option.  We will gather information from his primary care doctors including labs to look at liver enzyme levels over the last few years as well as his current Dr. Gavin Pound who found the most recent liver enzyme elevation.  We will use this information to make determination of the next step.  Supportive therapy dealing with his go to get married.    Disc value of music for his mental health.   Good response to the med combination.  Polypharmacy necessary.   Since 2017 on pramipexole has been much better with regard to mood stability.  Disc weight loss strategies.  Using CPAP consistently and he requires less sleep.  Disc sleep hygiene and getting adequate quantity for mental health.  Sleep deprivation increases risk mania.   No med changes indicated  yet.  Sig risk in switching his primary mood stabilizer unless its absolutely nececessary.  30 min  FU 6 weeks  Lynder Parents, MD, DFAPA   Please see After Visit Summary for patient specific instructions.  No future appointments.  No orders of the defined types were placed in this encounter.     -------------------------------

## 2020-01-26 DIAGNOSIS — Z79899 Other long term (current) drug therapy: Secondary | ICD-10-CM | POA: Diagnosis not present

## 2020-01-26 DIAGNOSIS — M1009 Idiopathic gout, multiple sites: Secondary | ICD-10-CM | POA: Diagnosis not present

## 2020-01-29 DIAGNOSIS — E291 Testicular hypofunction: Secondary | ICD-10-CM | POA: Diagnosis not present

## 2020-02-12 DIAGNOSIS — E291 Testicular hypofunction: Secondary | ICD-10-CM | POA: Diagnosis not present

## 2020-02-26 DIAGNOSIS — E291 Testicular hypofunction: Secondary | ICD-10-CM | POA: Diagnosis not present

## 2020-03-01 DIAGNOSIS — E559 Vitamin D deficiency, unspecified: Secondary | ICD-10-CM | POA: Diagnosis not present

## 2020-03-01 DIAGNOSIS — Z0000 Encounter for general adult medical examination without abnormal findings: Secondary | ICD-10-CM | POA: Diagnosis not present

## 2020-03-01 DIAGNOSIS — E291 Testicular hypofunction: Secondary | ICD-10-CM | POA: Diagnosis not present

## 2020-03-01 DIAGNOSIS — R972 Elevated prostate specific antigen [PSA]: Secondary | ICD-10-CM | POA: Diagnosis not present

## 2020-03-01 DIAGNOSIS — Z79899 Other long term (current) drug therapy: Secondary | ICD-10-CM | POA: Diagnosis not present

## 2020-03-01 DIAGNOSIS — Z0184 Encounter for antibody response examination: Secondary | ICD-10-CM | POA: Diagnosis not present

## 2020-03-01 DIAGNOSIS — E782 Mixed hyperlipidemia: Secondary | ICD-10-CM | POA: Diagnosis not present

## 2020-03-01 DIAGNOSIS — N183 Chronic kidney disease, stage 3 unspecified: Secondary | ICD-10-CM | POA: Diagnosis not present

## 2020-03-01 DIAGNOSIS — M109 Gout, unspecified: Secondary | ICD-10-CM | POA: Diagnosis not present

## 2020-03-01 DIAGNOSIS — I10 Essential (primary) hypertension: Secondary | ICD-10-CM | POA: Diagnosis not present

## 2020-03-11 ENCOUNTER — Other Ambulatory Visit: Payer: Self-pay | Admitting: Psychiatry

## 2020-03-11 DIAGNOSIS — E291 Testicular hypofunction: Secondary | ICD-10-CM | POA: Diagnosis not present

## 2020-03-23 ENCOUNTER — Encounter: Payer: Self-pay | Admitting: Psychiatry

## 2020-03-23 ENCOUNTER — Ambulatory Visit (INDEPENDENT_AMBULATORY_CARE_PROVIDER_SITE_OTHER): Payer: BC Managed Care – PPO | Admitting: Psychiatry

## 2020-03-23 ENCOUNTER — Other Ambulatory Visit: Payer: Self-pay

## 2020-03-23 DIAGNOSIS — R748 Abnormal levels of other serum enzymes: Secondary | ICD-10-CM

## 2020-03-23 DIAGNOSIS — F4310 Post-traumatic stress disorder, unspecified: Secondary | ICD-10-CM

## 2020-03-23 DIAGNOSIS — F3181 Bipolar II disorder: Secondary | ICD-10-CM

## 2020-03-23 DIAGNOSIS — G4733 Obstructive sleep apnea (adult) (pediatric): Secondary | ICD-10-CM

## 2020-03-23 NOTE — Progress Notes (Signed)
[name] [id number] 01/06/1956 64 y.o.  Subjective:   Patient ID:  [name] is a 64 y.o. (DOB 12-01-1955) male.  Chief Complaint:  Chief Complaint  Patient presents with  . Follow-up    mood and anxiety    Depression        Associated symptoms include no decreased concentration and no suicidal ideas.  [name] presents to the office today for follow-up of bipolar depression and anxiety.  seen July 02, 2019.  No meds were changed.  Patient was doing well.  He asked me to write a letter to his attorney and support his intention to marry a woman from overseas and that his mental health was sufficiently well that he would not represent a danger to his fiance.  This was discussed with his attorney and the letter was written.  Met girl June 8. Met her on dating site international.  [place] woman and decided wanted to get married.  Options opening up after being shut down by Covid.  seen November 2020.  No meds were changed.  He was overall doing well.  Last seen January 20, 2020 .  No med changed.  Following noted. Wonders about elevated liver enzymes and Depakote.  Labs not visible in Epic.  PCP Dr. Gavin [name] is working up this finding.   Doing great from mental health.  Talks daily to GF in [place].    03/23/20 the following is noted:    Taken on too many students.  Recognizes needs to have 2 days off a week and plans to schedule that.  Needs to get through end of the semester.  Fiance visa is difficult but she just got admitted to [name] pending some exams being passed.  Visa difficult with Covid affecting travel.  She could be here in mid-July.  Otherwise, Patient reports stable mood and denies depressed or irritable moods.   Patient denies any recent difficulty with anxiety.  Patient denies difficulty with sleep initiation or maintenance.  Was down to 4-5 hours nightly and called sleep doc [name]. Sleep hygiene is better now and that' s helped.   Denies appetite  disturbance.  Patient reports that energy and motivation have been good.  Patient denies any difficulty with concentration.  Patient denies any suicidal ideation.  Still concerned about weight and blames meds and wonders about weight loss meds and supplements.  Asked about L-glutamine taken it for a week.  Trying to watch diet.  Hasn't weighed himself.  No alcohol.    M died Jul 24, 2019.  Was a relief for her.    Past Psychiatric Medication Trials: Lithium increased Cr and switch to VPA 2012,  Depakote 1000, lamotrigine,  gabapentin, Vraylar tremor, Rexulti SE, Abilify increased weight,   Wellbutrin NR, phentermine,    Review of Systems:  Review of Systems  Musculoskeletal: Positive for arthralgias.  Neurological: Negative for tremors and weakness.  Psychiatric/Behavioral: Positive for depression. Negative for agitation, behavioral problems, confusion, decreased concentration, hallucinations, self-injury, sleep disturbance and suicidal ideas. The patient is not hyperactive.   No longer depressed.   Medications: I have reviewed the patient's current medications.  Current Outpatient Medications  Medication Sig Dispense Refill  . Cholecalciferol 1000 units TBDP Take 1 tablet by mouth daily.     . divalproex (DEPAKOTE ER) 500 MG 24 hr tablet TAKE 2 TABLETS AT BEDTIME. 60 tablet 2  . DULoxetine (CYMBALTA) 60 MG capsule TAKE 2 CAPSULES DAILY. 60 capsule 2  . febuxostat (ULORIC) 40 MG  tablet Take 40 mg by mouth daily.    . furosemide (LASIX) 20 MG tablet     . lamoTRIgine (LAMICTAL) 200 MG tablet TAKE 1 TABLET IN THE MORNING. 30 tablet 2  . LevOCARNitine (L-CARNITINE) 500 MG TABS Take 500 mg by mouth 2 (two) times daily.    [name] losartan (COZAAR) 50 MG tablet Take 1 tablet (50 mg total) by mouth daily. 30 tablet 0  . Multiple Vitamins-Minerals (CENTRUM ADULTS) TABS Take by mouth.    . multivitamin-lutein (OCUVITE-LUTEIN) CAPS capsule Take 1 capsule by mouth daily.    . pramipexole (MIRAPEX)  0.5 MG tablet TAKE 1 TABLET BY MOUTH TWICE DAILY. 60 tablet 0  . psyllium (METAMUCIL) 58.6 % powder Take 1 packet by mouth daily.     . Turmeric (QC TUMERIC COMPLEX) 500 MG CAPS Take by mouth.    . zinc gluconate 50 MG tablet Take 50 mg by mouth daily.     No current facility-administered medications for this visit.    Medication Side Effects: Other: weight gain.  Allergies:  Allergies  Allergen Reactions  . Allopurinol Other (See Comments)    Made pt emotionally unstable   . Penicillins Hives and Other (See Comments)    Has patient had a PCN reaction causing immediate rash, facial/tongue/throat swelling, SOB or lightheadedness with hypotension: No Has patient had a PCN reaction causing severe rash involving mucus membranes or skin necrosis: No Has patient had a PCN reaction that required hospitalization No Has patient had a PCN reaction occurring within the last 10 years: No If all of the above answers are "NO", then may proceed with Cephalosporin use.  . Aspirin Hives  . Ciprofloxacin Rash    Past Medical History:  Diagnosis Date  . Allergic rhinitis   . Bipolar disorder ([place])   . 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 CKD (chronic kidney disease) stage 2, GFR 60-89 ml/min   . Gout   . Hypertension   . Mild renal insufficiency    , with creatinine of 1.3 in 2010, was side effect of med  . Obesity    ,moderate  . Prostatitis    , Episodic  . Suicide attempt ([place]) 09/20/2014    Family History  Problem Relation Age of Onset  . Hypertension Father   . Gout Father   . Alzheimer's disease Father   . Other Mother        Viral Meningitis  . Mitral valve prolapse Mother   . Arrhythmia Mother   . Hypothyroidism Mother     Social History   Socioeconomic History  . Marital status: Single    Spouse name: Not on file  . Number of children: Not on file  . Years of education: Not on file  . Highest education level: Not on file  Occupational History  . Not on file   Tobacco Use  . Smoking status: Never Smoker  . Smokeless tobacco: Never Used  Substance and Sexual Activity  . Alcohol use: No  . Drug use: No  . Sexual activity: Not on file  Other Topics Concern  . Not on file  Social History Narrative  . Not on file   Social Determinants of Health   Financial Resource Strain:   . Difficulty of Paying Living Expenses:   Food Insecurity:   . Worried About Charity fundraiser in the Last Year:   . Arboriculturist in the Last Year:   Transportation Needs:   .  Lack of Transportation (Medical):   [name] Lack of Transportation (Non-Medical):   Physical Activity:   . Days of Exercise per Week:   . Minutes of Exercise per Session:   Stress:   . Feeling of Stress :   Social Connections:   . Frequency of Communication with Friends and Family:   . Frequency of Social Gatherings with Friends and Family:   . Attends Religious Services:   . Active Member of Clubs or Organizations:   . Attends Archivist Meetings:   [name] Marital Status:   Intimate Partner Violence:   . Fear of Current or Ex-Partner:   . Emotionally Abused:   [name] Physically Abused:   . Sexually Abused:     Past Medical History, Surgical history, Social history, and Family history were reviewed and updated as appropriate.   Please see review of systems for further details on the patient's review from today.   Objective:   Physical Exam:  There were no vitals taken for this visit.  Physical Exam Constitutional:      General: He is not in acute distress.    Appearance: He is well-developed.  Musculoskeletal:        General: No deformity.  Neurological:     Mental Status: He is alert and oriented to person, place, and time.     Motor: No tremor.     Coordination: Coordination normal.     Gait: Gait normal.  Psychiatric:        Attention and Perception: Attention normal. He is attentive.        Mood and Affect: Mood is not anxious or depressed. Affect is not labile,  blunt, angry or inappropriate.        Speech: Speech normal.        Behavior: Behavior normal.        Thought Content: Thought content normal. Thought content does not include homicidal or suicidal ideation. Thought content does not include homicidal or suicidal plan.        Cognition and Memory: Cognition normal.        Judgment: Judgment normal.     Comments: Insight is good.  Some situational anxiety managed     Lab Review:     Component Value Date/Time   NA 140 02/04/2017 0525   K 4.1 02/04/2017 0525   CL 105 02/04/2017 0525   CO2 27 02/04/2017 0525   GLUCOSE 120 (H) 02/04/2017 0525   BUN 10 02/04/2017 0525   CREATININE 1.07 02/04/2017 0525   CALCIUM 9.2 02/04/2017 0525   PROT 6.9 02/01/2017 0152   ALBUMIN 3.8 02/01/2017 0152   AST 21 02/01/2017 0152   ALT 27 02/01/2017 0152   ALKPHOS 56 02/01/2017 0152   BILITOT 0.6 02/01/2017 0152   GFRNONAA >60 02/04/2017 0525   GFRAA >60 02/04/2017 0525       Component Value Date/Time   WBC 7.4 02/03/2017 0527   RBC 3.48 (L) 02/03/2017 0527   HGB 10.8 (L) 02/03/2017 0527   HCT 31.1 (L) 02/03/2017 0527   PLT 253 02/03/2017 0527   MCV 89.4 02/03/2017 0527   MCH 31.0 02/03/2017 0527   MCHC 34.7 02/03/2017 0527   RDW 13.6 02/03/2017 0527   LYMPHSABS 1.7 02/01/2017 0205   MONOABS 2.2 (H) 02/01/2017 0205   EOSABS 0.1 02/01/2017 0205   BASOSABS 0.2 (H) 02/01/2017 0205    No results found for: POCLITH, LITHIUM   Lab Results  Component Value Date   VALPROATE <10.0 (L) 09/22/2014  Sleep study dx severe OSA AHI 53.   .res Assessment: Plan:    Adriaan was seen today for follow-up.  Diagnoses and all orders for this visit:  Bipolar II disorder ([place])  PTSD (post-traumatic stress disorder)  Obstructive sleep apnea  Elevated liver enzymes  Greater than 50% of 40 min face to face time with patient was spent on counseling and coordination of care. We discussed  Mood remains stable but concerns over elevated liver  enzymes.  Labs not available.  Switch from lithium to VPA in 2012 was followed up with CMP which was normal at the time except for elevated ammonia level.  Which was the reason for adding L-carnitine and this corrected the ammonia level.  Of his current meds the most likely potential cause of elevated liver enzymes would be VPA but of course there are other potential causes such as fatty liver, etc.  He wonders about switching off Depakote.  However Depakote is the primary mood stabilizer that is preventing mania.  If we stop Depakote he will have to be switched to another mood stabilizer.  The only nonantipsychotic mood stabilizer left would be carbamazepine and that causes multiple drug interaction issues.  Switching to an antipsychotic type mood stabilizer could render the off label use of the pramipexole ineffective posing a risk of relapse of depression as well as the typical potential side effects of antipsychotics.  Therefore it would be preferable not to switch off Depakote unless there is no other option.  We will gather information from his primary care doctors including labs to look at liver enzyme levels over the last few years as well as his current Dr. Gavin [name] who found the most recent liver enzyme elevation.  We will use this information to make determination of the next step.  Supportive therapy dealing with his go to get married.    Disc value of music for his mental health.   Good response to the med combination.  Polypharmacy necessary.   Since 2017 on pramipexole has been much better with regard to mood stability.  Disc weight loss strategies.  Using CPAP consistently and he requires less sleep.  Disc sleep hygiene and getting adequate quantity for mental health.  Sleep deprivation increases risk mania.   Monitor BP at home as key to manage elevated Cr.  Disc how to monitor this.    Extensive discussion of his concerns about Cr. And liver enzymes  Which are mildly elevated  but only recently after years of being on Depakote and not correlated with timing of Depakote use. Sees PCP in August about these to follow up.    No med changes indicated yet.  Sig risk in switching his primary mood stabilizer unless its absolutely nececessary.  30 min  FU 2-3 mos  Lynder Parents, MD, DFAPA   Please see After Visit Summary for patient specific instructions.  No future appointments.  No orders of the defined types were placed in this encounter.     -------------------------------

## 2020-03-25 ENCOUNTER — Other Ambulatory Visit: Payer: Self-pay | Admitting: Psychiatry

## 2020-03-25 DIAGNOSIS — E291 Testicular hypofunction: Secondary | ICD-10-CM | POA: Diagnosis not present

## 2020-04-15 ENCOUNTER — Other Ambulatory Visit: Payer: Self-pay | Admitting: Psychiatry

## 2020-04-22 DIAGNOSIS — E291 Testicular hypofunction: Secondary | ICD-10-CM | POA: Diagnosis not present

## 2020-05-06 DIAGNOSIS — E291 Testicular hypofunction: Secondary | ICD-10-CM | POA: Diagnosis not present

## 2020-05-20 DIAGNOSIS — E291 Testicular hypofunction: Secondary | ICD-10-CM | POA: Diagnosis not present

## 2020-05-27 DIAGNOSIS — Z961 Presence of intraocular lens: Secondary | ICD-10-CM | POA: Diagnosis not present

## 2020-06-03 DIAGNOSIS — Z1211 Encounter for screening for malignant neoplasm of colon: Secondary | ICD-10-CM | POA: Diagnosis not present

## 2020-06-03 DIAGNOSIS — N529 Male erectile dysfunction, unspecified: Secondary | ICD-10-CM | POA: Diagnosis not present

## 2020-06-17 DIAGNOSIS — E291 Testicular hypofunction: Secondary | ICD-10-CM | POA: Diagnosis not present

## 2020-06-22 ENCOUNTER — Encounter: Payer: Self-pay | Admitting: Cardiology

## 2020-06-22 ENCOUNTER — Ambulatory Visit: Payer: BC Managed Care – PPO | Admitting: Cardiology

## 2020-06-22 ENCOUNTER — Other Ambulatory Visit: Payer: Self-pay

## 2020-06-22 VITALS — BP 150/100 | HR 70 | Ht 71.0 in | Wt 275.0 lb

## 2020-06-22 DIAGNOSIS — I4589 Other specified conduction disorders: Secondary | ICD-10-CM | POA: Diagnosis not present

## 2020-06-22 DIAGNOSIS — I10 Essential (primary) hypertension: Secondary | ICD-10-CM

## 2020-06-22 NOTE — Patient Instructions (Addendum)
Medication Instructions:  *If you need a refill on your cardiac medications before your next appointment, please call your pharmacy*  Lab Work: If you have labs (blood work) drawn today and your tests are completely normal, you will receive your results only by: . MyChart Message (if you have MyChart) OR . A paper copy in the mail If you have any lab test that is abnormal or we need to change your treatment, we will call you to review the results.  Testing/Procedures: None ordered today.  Follow-Up: At [facility], you and your health needs are our priority.  As part of our continuing mission to provide you with exceptional heart care, we have created designated Provider Care Teams.  These Care Teams include your primary Cardiologist (physician) and Advanced Practice Providers (APPs -  Physician Assistants and Nurse Practitioners) who all work together to provide you with the care you need, when you need it.  We recommend signing up for the patient portal called "MyChart".  Sign up information is provided on this After Visit Summary.  MyChart is used to connect with patients for Virtual Visits (Telemedicine).  Patients are able to view lab/test results, encounter notes, upcoming appointments, etc.  Non-urgent messages can be sent to your provider as well.   To learn more about what you can do with MyChart, go to [email or web address].    Your next appointment:   12 month(s)  The format for your next appointment:   In Person  Provider:   You may see Dr. Skains or one of the following Advanced Practice Providers on your designated Care Team:    Lori Gerhardt, NP  Laura Ingold, NP  Jill McDaniel, NP    

## 2020-06-22 NOTE — Progress Notes (Signed)
Cardiology Office Note:    Date:  06/22/2020   ID:  [name], DOB [birth year], MRN [id number]  PCP:  Marden Noble, MD  [facility] Cardiologist:  No primary care provider on file.  [facility] Electrophysiologist:  None   Referring MD: Marden Noble, MD     History of Present Illness:    [name] is a 64 y.o. 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  Has bipolar disorder.  Diltiazem was used for high blood pressure and was discontinued and he continued to have slow heart rates.  Cardiac catheterization in 2012 was reassuring.  Thankfully after discontinuing the lithium he felt better, felt more spontaneous on Depakote.  Dr. Jennelle Human is his psychiatrist.  He is a Engineer, agricultural.  Masters in music.  Has a chemistry major from [place].  Had Dr. Cecil Cobbs for organic chemistry.   Vertigo going left, doing well now.  No fevers chills nausea vomiting syncope  Past Medical History:  Diagnosis Date  . Allergic rhinitis   . Bipolar disorder ([facility])   . 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 CKD (chronic kidney disease) stage 2, GFR 60-89 ml/min   . Gout   . Hypertension   . Mild renal insufficiency    , with creatinine of 1.3 in 2010, was side effect of med  . Obesity    ,moderate  . Prostatitis    , Episodic  . Suicide attempt ([facility]) 09/20/2014    Past Surgical History:  Procedure Laterality Date  . APPENDECTOMY    . CATARACT EXTRACTION Right   . COLONOSCOPY WITH PROPOFOL N/A 06/14/2014   Procedure: COLONOSCOPY WITH PROPOFOL;  Surgeon: Charolett Bumpers, MD;  Location: [facility];  Service: Endoscopy;  Laterality: N/A;  . TONSILLECTOMY    . UMBILICAL HERNIA REPAIR      Current Medications: Current Meds  Medication Sig  . Cholecalciferol 1000 units TBDP Take 1 tablet by mouth daily.   . divalproex (DEPAKOTE ER) 500 MG 24 hr tablet TAKE 2 TABLETS AT BEDTIME.  . DULoxetine (CYMBALTA) 60 MG capsule  TAKE 2 CAPSULES DAILY.  . febuxostat (ULORIC) 40 MG tablet Take 40 mg by mouth daily.  . furosemide (LASIX) 20 MG tablet   . lamoTRIgine (LAMICTAL) 200 MG tablet TAKE 1 TABLET IN THE MORNING.  . LevOCARNitine (L-CARNITINE) 500 MG TABS Take 500 mg by mouth 2 (two) times daily.  [name] losartan (COZAAR) 50 MG tablet Take 1 tablet (50 mg total) by mouth daily.  . Multiple Vitamins-Minerals (CENTRUM ADULTS) TABS Take by mouth.  . multivitamin-lutein (OCUVITE-LUTEIN) CAPS capsule Take 1 capsule by mouth daily.  . pramipexole (MIRAPEX) 0.5 MG tablet TAKE 1 TABLET BY MOUTH TWICE DAILY.  [name] psyllium (METAMUCIL) 58.6 % powder Take 1 packet by mouth daily.   . Turmeric (QC TUMERIC COMPLEX) 500 MG CAPS Take by mouth.  . zinc gluconate 50 MG tablet Take 50 mg by mouth daily.     Allergies:   Allopurinol, Penicillins, Aspirin, and Ciprofloxacin   Social History   Socioeconomic History  . Marital status: Single    Spouse name: Not on file  . Number of children: Not on file  . Years of education: Not on file  . Highest education level: Not on file  Occupational History  . Not on file  Tobacco Use  . Smoking status: Never Smoker  . Smokeless tobacco: Never Used  Substance and Sexual Activity  .  Alcohol use: No  . Drug use: No  . Sexual activity: Not on file  Other Topics Concern  . Not on file  Social History Narrative  . Not on file   Social Determinants of Health   Financial Resource Strain:   . Difficulty of Paying Living Expenses:   Food Insecurity:   . Worried About Programme researcher, broadcasting/film/video in the Last Year:   . Barista in the Last Year:   Transportation Needs:   . Freight forwarder (Medical):   [name] Lack of Transportation (Non-Medical):   Physical Activity:   . Days of Exercise per Week:   . Minutes of Exercise per Session:   Stress:   . Feeling of Stress :   Social Connections:   . Frequency of Communication with Friends and Family:   . Frequency of Social Gatherings with  Friends and Family:   . Attends Religious Services:   . Active Member of Clubs or Organizations:   . Attends Banker Meetings:   [name] Marital Status:      Family History: The patient's family history includes Alzheimer's disease in his father; Arrhythmia in his mother; Gout in his father; Hypertension in his father; Hypothyroidism in his mother; Mitral valve prolapse in his mother; Other in his mother.  ROS:   Please see the history of present illness.     All other systems reviewed and are negative.  EKGs/Labs/Other Studies Reviewed:      EKG:  EKG is ordered today.  The ekg ordered today demonstrates sinus rhythm 68 incomplete right bundle branch block no other abnormalities 06/08/2019-normal sinus rhythm 62 with nonspecific ST-T wave changes  Recent Labs: No results found for requested labs within last 8760 hours.  Recent Lipid Panel No results found for: CHOL, TRIG, HDL, CHOLHDL, VLDL, LDLCALC, LDLDIRECT  Physical Exam:    VS:  BP (!) 150/100   Pulse 70   Ht 5\' 11"  (1.803 m)   Wt 275 lb (124.7 kg)   SpO2 97%   BMI 38.35 kg/m     Wt Readings from Last 3 Encounters:  06/22/20 275 lb (124.7 kg)  06/08/19 262 lb 9.6 oz (119.1 kg)  02/06/18 247 lb 12.8 oz (112.4 kg)     GEN:  Well nourished, well developed in no acute distress HEENT: Normal NECK: No JVD; No carotid bruits LYMPHATICS: No lymphadenopathy CARDIAC: RRR, no murmurs, rubs, gallops RESPIRATORY:  Clear to auscultation without rales, wheezing or rhonchi  ABDOMEN: Soft, non-tender, non-distended MUSCULOSKELETAL:  No edema; No deformity  SKIN: Warm and dry NEUROLOGIC:  Alert and oriented x 3 PSYCHIATRIC:  Normal affect   ASSESSMENT:    1. AV dissociation   2. Essential hypertension    PLAN:    In order of problems listed above:  Previous A-V dissociation.   -Overall feels well without any symptoms.  No syncopal symptoms.  Once again, after stopping diltiazem and lithium condition improved.     Hypertension  -Doing well with current medications.  No changes.  Does have a small degree of whitecoat hypertension.  High today but he is usually fine.  Obesity  -Continuing to work on weight loss, decreasing carbs.  Prevention -LDL 92 HDL 33 triglycerides 98 in 2019.  Creatinine 1.29 in 2020.  One-year follow-up, he would like to continue to maintain visits given his prior electrical abnormality.   Medication Adjustments/Labs and Tests Ordered: Current medicines are reviewed at length with the patient today.  Concerns regarding  medicines are outlined above.  Orders Placed This Encounter  Procedures  . EKG 12-Lead   No orders of the defined types were placed in this encounter.   Patient Instructions  Medication Instructions:  *If you need a refill on your cardiac medications before your next appointment, please call your pharmacy*  Lab Work: If you have labs (blood work) drawn today and your tests are completely normal, you will receive your results only by: [name] MyChart Message (if you have MyChart) OR . A paper copy in the mail If you have any lab test that is abnormal or we need to change your treatment, we will call you to review the results.  Testing/Procedures: None ordered today.  Follow-Up: At [facility], you and your health needs are our priority.  As part of our continuing mission to provide you with exceptional heart care, we have created designated Provider Care Teams.  These Care Teams include your primary Cardiologist (physician) and Advanced Practice Providers (APPs -  Physician Assistants and Nurse Practitioners) who all work together to provide you with the care you need, when you need it.  We recommend signing up for the patient portal called "MyChart".  Sign up information is provided on this After Visit Summary.  MyChart is used to connect with patients for Virtual Visits (Telemedicine).  Patients are able to view lab/test results, encounter notes,  upcoming appointments, etc.  Non-urgent messages can be sent to your provider as well.   To learn more about what you can do with MyChart, go to [email or web address].    Your next appointment:   12 month(s)  The format for your next appointment:   In Person  Provider:   You may see Dr. Anne Fu or one of the following Advanced Practice Providers on your designated Care Team:    Norma Fredrickson, NP  Nada Boozer, NP  Georgie Chard, NP      Signed, Donato Schultz, MD  06/22/2020 10:29 AM    [facility]

## 2020-06-27 ENCOUNTER — Ambulatory Visit: Payer: BC Managed Care – PPO | Admitting: Cardiology

## 2020-07-01 DIAGNOSIS — E291 Testicular hypofunction: Secondary | ICD-10-CM | POA: Diagnosis not present

## 2020-07-06 ENCOUNTER — Other Ambulatory Visit: Payer: Self-pay

## 2020-07-06 ENCOUNTER — Ambulatory Visit (INDEPENDENT_AMBULATORY_CARE_PROVIDER_SITE_OTHER): Payer: BC Managed Care – PPO | Admitting: Psychiatry

## 2020-07-06 ENCOUNTER — Encounter: Payer: Self-pay | Admitting: Psychiatry

## 2020-07-06 VITALS — Wt 275.0 lb

## 2020-07-06 DIAGNOSIS — G4733 Obstructive sleep apnea (adult) (pediatric): Secondary | ICD-10-CM | POA: Diagnosis not present

## 2020-07-06 DIAGNOSIS — F4310 Post-traumatic stress disorder, unspecified: Secondary | ICD-10-CM

## 2020-07-06 DIAGNOSIS — F3181 Bipolar II disorder: Secondary | ICD-10-CM | POA: Diagnosis not present

## 2020-07-06 MED ORDER — PRAMIPEXOLE DIHYDROCHLORIDE 0.5 MG PO TABS: 0.5000 mg | ORAL_TABLET | Freq: Two times a day (BID) | ORAL | 5 refills | Status: DC

## 2020-07-06 MED ORDER — DIVALPROEX SODIUM ER 500 MG PO TB24: 1000.0000 mg | ORAL_TABLET | Freq: Every day | ORAL | 5 refills | Status: DC

## 2020-07-06 MED ORDER — LAMOTRIGINE 200 MG PO TABS: 200.0000 mg | ORAL_TABLET | Freq: Every morning | ORAL | 5 refills | Status: DC

## 2020-07-06 MED ORDER — DULOXETINE HCL 60 MG PO CPEP: 120.0000 mg | ORAL_CAPSULE | Freq: Every day | ORAL | 5 refills | Status: DC

## 2020-07-06 NOTE — Progress Notes (Signed)
Edwin [name] [id number] 1956-08-01 64 y.o.  Subjective:   Patient ID:  Edwin [name] is a 64 y.o. (DOB Nov 09, 1956) male.  Chief Complaint:  Chief Complaint  Patient presents with  . Follow-up    mood and meds and anxiety    Depression        Associated symptoms include no decreased concentration and no suicidal ideas.  Edwin [name] presents to the office today for follow-up of bipolar depression and anxiety.  seen July 02, 2019.  No meds were changed.  Patient was doing well.  He asked me to write a letter to his attorney and support his intention to marry a woman from [place] and that his mental health was sufficiently well that he would not represent a danger to his fiance.  This was discussed with his attorney and the letter was written.  Met girl Edwin [name]. Met her on dating site international.  [place] woman and decided wanted to get married.  Options opening up after being shut down by Covid.  seen November 2020.  No meds were changed.  He was overall doing well.  seen January 20, 2020 .  No med changed.  Following noted. Wonders about elevated liver enzymes and Depakote.  Labs not visible in Epic.  PCP Dr. Gavin Pound is working up this finding.   Doing great from mental health.  Talks daily to GF in [place].    03/23/20 the following is noted:    Taken on too many students.  Recognizes needs to have 2 days off a week and plans to schedule that.  Needs to get through end of the semester.  Fiance visa is difficult but she just got admitted to [facility] pending some exams being passed.  Visa difficult with Covid affecting travel.  She could be here in mid-July. No med changes  [name]/18/21 with the following noted: Still good. Pramipexole has kept depression at [place]. Mood is still stable. Good interest in basketball.   Otherwise, Patient reports stable mood and denies depressed or irritable moods.   Patient denies any recent difficulty with anxiety.  Patient denies difficulty  with sleep initiation or maintenance.  Was down to 4-5 hours nightly and called sleep doc Edwin [name]. Sleep hygiene is better now and that' s helped.   Denies appetite disturbance.  Patient reports that energy and motivation have been good.  Patient denies any difficulty with concentration.  Patient denies any suicidal ideation.  Still concerned about weight and blames meds and wonders about weight loss meds and supplements.  Asked about L-glutamine taken it for a week.  Trying to watch diet.  Hasn't weighed himself.  No alcohol.    M died 07-09-2019.  Was a relief for her.    Past Psychiatric Medication Trials: Lithium increased Cr and switch to VPA 2012,  Depakote 1000, lamotrigine,  gabapentin, Vraylar tremor, Rexulti SE, Abilify increased weight,   Wellbutrin NR, phentermine,    Review of Systems:  Review of Systems  Cardiovascular: Negative for palpitations.  Musculoskeletal: Positive for arthralgias.  Neurological: Negative for tremors and weakness.  Psychiatric/Behavioral: Positive for depression. Negative for agitation, behavioral problems, confusion, decreased concentration, hallucinations, self-injury, sleep disturbance and suicidal ideas. The patient is not hyperactive.   No longer depressed.   Medications: I have reviewed the patient's current medications.  Current Outpatient Medications  Medication Sig Dispense Refill  . Cholecalciferol 1000 units TBDP Take 1 tablet by mouth daily.     . divalproex (DEPAKOTE ER) 500  MG 24 hr tablet TAKE 2 TABLETS AT BEDTIME. 60 tablet 5  . DULoxetine (CYMBALTA) 60 MG capsule TAKE 2 CAPSULES DAILY. 60 capsule 5  . febuxostat (ULORIC) 40 MG tablet Take 40 mg by mouth daily.    . furosemide (LASIX) 20 MG tablet     . lamoTRIgine (LAMICTAL) 200 MG tablet TAKE 1 TABLET IN THE MORNING. 30 tablet 5  . LevOCARNitine (L-CARNITINE) 500 MG TABS Take 500 mg by mouth 2 (two) times daily.    [name] losartan (COZAAR) 50 MG tablet Take 1 tablet (50 mg total)  by mouth daily. 30 tablet 0  . Multiple Vitamins-Minerals (CENTRUM ADULTS) TABS Take by mouth.    . multivitamin-lutein (OCUVITE-LUTEIN) CAPS capsule Take 1 capsule by mouth daily.    . pramipexole (MIRAPEX) 0.5 MG tablet TAKE 1 TABLET BY MOUTH TWICE DAILY. 60 tablet 5  . psyllium (METAMUCIL) 58.6 % powder Take 1 packet by mouth daily.     . Turmeric (QC TUMERIC COMPLEX) 500 MG CAPS Take by mouth.    . zinc gluconate 50 MG tablet Take 50 mg by mouth daily.     No current facility-administered medications for this visit.    Medication Side Effects: Other: weight gain.  Allergies:  Allergies  Allergen Reactions  . Allopurinol Other (See Comments)    Made pt emotionally unstable   . Penicillins Hives and Other (See Comments)    Has patient had a PCN reaction causing immediate rash, facial/tongue/throat swelling, SOB or lightheadedness with hypotension: No Has patient had a PCN reaction causing severe rash involving mucus membranes or skin necrosis: No Has patient had a PCN reaction that required hospitalization No Has patient had a PCN reaction occurring within the last 10 years: No If all of the above answers are "NO", then may proceed with Cephalosporin use.  . Aspirin Hives  . Ciprofloxacin Rash    Past Medical History:  Diagnosis Date  . Allergic rhinitis   . Bipolar disorder ([place])   . 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 CKD (chronic kidney disease) stage 2, GFR 60-89 ml/min   . Gout   . Hypertension   . Mild renal insufficiency    , with creatinine of 1.3 in 2010, was side effect of med  . Obesity    ,moderate  . Prostatitis    , Episodic  . Suicide attempt ([place]) 09/20/2014    Family History  Problem Relation Age of Onset  . Hypertension Father   . Gout Father   . Alzheimer's disease Father   . Other Mother        Viral Meningitis  . Mitral valve prolapse Mother   . Arrhythmia Mother   . Hypothyroidism Mother     Social History   Socioeconomic History   . Marital status: Single    Spouse name: Not on file  . Number of children: Not on file  . Years of education: Not on file  . Highest education level: Not on file  Occupational History  . Not on file  Tobacco Use  . Smoking status: Never Smoker  . Smokeless tobacco: Never Used  Substance and Sexual Activity  . Alcohol use: No  . Drug use: No  . Sexual activity: Not on file  Other Topics Concern  . Not on file  Social History Narrative  . Not on file   Social Determinants of Health   Financial Resource Strain:   . Difficulty of Paying Living Expenses:  Food Insecurity:   . Worried About Charity fundraiser in the Last Year:   . Arboriculturist in the Last Year:   Transportation Needs:   . Film/video editor (Medical):   [name] Lack of Transportation (Non-Medical):   Physical Activity:   . Days of Exercise per Week:   . Minutes of Exercise per Session:   Stress:   . Feeling of Stress :   Social Connections:   . Frequency of Communication with Friends and Family:   . Frequency of Social Gatherings with Friends and Family:   . Attends Religious Services:   . Active Member of Clubs or Organizations:   . Attends Archivist Meetings:   [name] Marital Status:   Intimate Partner Violence:   . Fear of Current or Ex-Partner:   . Emotionally Abused:   [name] Physically Abused:   . Sexually Abused:     Past Medical History, Surgical history, Social history, and Family history were reviewed and updated as appropriate.   Please see review of systems for further details on the patient's review from today.   Objective:   Physical Exam:  There were no vitals taken for this visit.  Physical Exam Constitutional:      General: He is not in acute distress.    Appearance: He is well-developed.  Musculoskeletal:        General: No deformity.  Neurological:     Mental Status: He is alert and oriented to person, place, and time.     Motor: No tremor.     Coordination:  Coordination normal.     Gait: Gait normal.  Psychiatric:        Attention and Perception: Attention normal. He is attentive.        Mood and Affect: Mood is not anxious or depressed. Affect is not labile, blunt, angry or inappropriate.        Speech: Speech normal. Speech is not rapid and pressured.        Behavior: Behavior normal.        Thought Content: Thought content normal. Thought content does not include homicidal or suicidal ideation. Thought content does not include homicidal or suicidal plan.        Cognition and Memory: Cognition normal.        Judgment: Judgment normal.     Comments: Insight is good.  Some situational anxiety managed     Lab Review:     Component Value Date/Time   NA 140 02/04/2017 0525   K 4.1 02/04/2017 0525   CL 105 02/04/2017 0525   CO2 27 02/04/2017 0525   GLUCOSE 120 (H) 02/04/2017 0525   BUN 10 02/04/2017 0525   CREATININE 1.07 02/04/2017 0525   CALCIUM 9.2 02/04/2017 0525   PROT 6.9 02/01/2017 0152   ALBUMIN 3.[name] 02/01/2017 0152   AST 21 02/01/2017 0152   ALT 27 02/01/2017 0152   ALKPHOS 56 02/01/2017 0152   BILITOT 0.6 02/01/2017 0152   GFRNONAA >60 02/04/2017 0525   GFRAA >60 02/04/2017 0525       Component Value Date/Time   WBC 7.4 02/03/2017 0527   RBC 3.48 (L) 02/03/2017 0527   HGB 10.[name] (L) 02/03/2017 0527   HCT 31.1 (L) 02/03/2017 0527   PLT 253 02/03/2017 0527   MCV 89.4 02/03/2017 0527   MCH 31.0 02/03/2017 0527   MCHC 34.7 02/03/2017 0527   RDW 13.6 02/03/2017 0527   LYMPHSABS 1.7 02/01/2017 0205   MONOABS 2.2 (  H) 02/01/2017 0205   EOSABS 0.1 02/01/2017 0205   BASOSABS 0.2 (H) 02/01/2017 0205    No results found for: POCLITH, LITHIUM   Lab Results  Component Value Date   VALPROATE <10.0 (L) 09/22/2014    Sleep study dx severe OSA AHI 53.   .res Assessment: Plan:    Nikolis was seen today for follow-up.  Diagnoses and all orders for this visit:  Bipolar II disorder ([place])  PTSD (post-traumatic stress  disorder)  Obstructive sleep apnea  Greater than 50% of 40 min face to face time with patient was spent on counseling and coordination of care. We discussed  Mood remains stable but concerns over elevated liver enzymes.  Labs not available.  Switch from lithium to VPA in 2012 was followed up with CMP which was normal at the time except for elevated ammonia level.  Which was the reason for adding L-carnitine and this corrected the ammonia level.  Of his current meds the most likely potential cause of elevated liver enzymes would be VPA but of course there are other potential causes such as fatty liver, etc.  He wonders about switching off Depakote.  However Depakote is the primary mood stabilizer that is preventing mania.  If we stop Depakote he will have to be switched to another mood stabilizer.  The only nonantipsychotic mood stabilizer left would be carbamazepine and that causes multiple drug interaction issues.  Switching to an antipsychotic type mood stabilizer could render the off label use of the pramipexole ineffective posing a risk of relapse of depression as well as the typical potential side effects of antipsychotics.  Therefore it would be preferable not to switch off Depakote unless there is no other option.  We will gather information from his primary care doctors including labs to look at liver enzyme levels over the last few years as well as his current Dr. Gavin Pound who found the most recent liver enzyme elevation.  We will use this information to make determination of the next step.  Supportive therapy dealing with his go to get married.    Disc value of music for his mental health.   Good response to the med combination.  Polypharmacy necessary.   Since 2017 on pramipexole has been much better with regard to mood stability.  Disc weight loss strategies.  Disc Ozempic as potential for weight loss.  Gave JAMA abstract.   Using CPAP consistently and he requires less sleep.  Disc  sleep hygiene and getting adequate quantity for mental health.  Sleep deprivation increases risk mania.   Monitor BP at home as key to manage elevated Cr.  Disc how to monitor this.    Extensive discussion of his concerns about Cr. And liver enzymes  Which are mildly elevated but only recently after years of being on Depakote and not correlated with timing of Depakote use. Sees PCP in August about these to follow up.    No med changes indicated yet.  Sig risk in switching his primary mood stabilizer unless its absolutely nececessary.  30 min  FU 3 mos  Lynder Parents, MD, DFAPA   Please see After Visit Summary for patient specific instructions.  No future appointments.  No orders of the defined types were placed in this encounter.     -------------------------------

## 2020-07-15 DIAGNOSIS — E291 Testicular hypofunction: Secondary | ICD-10-CM | POA: Diagnosis not present

## 2020-07-19 ENCOUNTER — Telehealth: Payer: Self-pay | Admitting: Psychiatry

## 2020-07-19 NOTE — Telephone Encounter (Signed)
Pt wants to know the name of the new weight loss drug that was discussed in his last appt.

## 2020-07-20 NOTE — Telephone Encounter (Signed)
Pt. Made aware.

## 2020-07-20 NOTE — Telephone Encounter (Signed)
Edwin [name], can you let him know it's called Ozempic

## 2020-07-26 DIAGNOSIS — I10 Essential (primary) hypertension: Secondary | ICD-10-CM | POA: Diagnosis not present

## 2020-07-26 DIAGNOSIS — R600 Localized edema: Secondary | ICD-10-CM | POA: Diagnosis not present

## 2020-07-29 DIAGNOSIS — E291 Testicular hypofunction: Secondary | ICD-10-CM | POA: Diagnosis not present

## 2020-08-04 DIAGNOSIS — D485 Neoplasm of uncertain behavior of skin: Secondary | ICD-10-CM | POA: Diagnosis not present

## 2020-08-04 DIAGNOSIS — L814 Other melanin hyperpigmentation: Secondary | ICD-10-CM | POA: Diagnosis not present

## 2020-08-04 DIAGNOSIS — L821 Other seborrheic keratosis: Secondary | ICD-10-CM | POA: Diagnosis not present

## 2020-08-12 DIAGNOSIS — E291 Testicular hypofunction: Secondary | ICD-10-CM | POA: Diagnosis not present

## 2020-08-16 DIAGNOSIS — G4733 Obstructive sleep apnea (adult) (pediatric): Secondary | ICD-10-CM | POA: Diagnosis not present

## 2020-08-23 DIAGNOSIS — M1009 Idiopathic gout, multiple sites: Secondary | ICD-10-CM | POA: Diagnosis not present

## 2020-08-23 DIAGNOSIS — Z79899 Other long term (current) drug therapy: Secondary | ICD-10-CM | POA: Diagnosis not present

## 2020-08-24 DIAGNOSIS — E669 Obesity, unspecified: Secondary | ICD-10-CM | POA: Diagnosis not present

## 2020-08-24 DIAGNOSIS — E291 Testicular hypofunction: Secondary | ICD-10-CM | POA: Diagnosis not present

## 2020-08-24 DIAGNOSIS — R739 Hyperglycemia, unspecified: Secondary | ICD-10-CM | POA: Diagnosis not present

## 2020-08-25 ENCOUNTER — Telehealth: Payer: Self-pay | Admitting: Psychiatry

## 2020-08-25 NOTE — Telephone Encounter (Signed)
Pt called to get advise about a med that he had talked to CC about in the past. His PCP gave him a Rx. Med needed PA was denied and appealed.Pt has gained from 180 to 270 lbs with no life style changes. Med helped with depression as well as weight gain. (OZEMTIC) contact # [phone]

## 2020-08-25 NOTE — Telephone Encounter (Signed)
Please review

## 2020-08-25 NOTE — Telephone Encounter (Signed)
I did encourage him to talk to PCP about Ozempic.  It is the most effective weight loss med yet.  Encourage him to give it a try.

## 2020-08-29 NOTE — Telephone Encounter (Signed)
Clarification on his message, he was letting you know that he received a denial on his PA for Ozempic. He's not sure if his PCP is doing appeal or they already have. Advised him to check with them first but it can take up to 30 days for a decision. Also advised him to send his own letter to help with the process.   He was calling to see if there was anything you suggest he do to get approved. Advised him to check with his pcp and let us know.

## 2020-08-29 NOTE — Telephone Encounter (Signed)
I don't have any other suggestion except that he make his doctor aware of the article I gave him.

## 2020-09-05 DIAGNOSIS — G4733 Obstructive sleep apnea (adult) (pediatric): Secondary | ICD-10-CM | POA: Diagnosis not present

## 2020-09-09 DIAGNOSIS — E291 Testicular hypofunction: Secondary | ICD-10-CM | POA: Diagnosis not present

## 2020-09-23 DIAGNOSIS — E291 Testicular hypofunction: Secondary | ICD-10-CM | POA: Diagnosis not present

## 2020-09-29 ENCOUNTER — Other Ambulatory Visit: Payer: Self-pay | Admitting: Psychiatry

## 2020-09-29 DIAGNOSIS — F3181 Bipolar II disorder: Secondary | ICD-10-CM

## 2020-10-05 ENCOUNTER — Other Ambulatory Visit: Payer: Self-pay

## 2020-10-05 ENCOUNTER — Encounter: Payer: Self-pay | Admitting: Psychiatry

## 2020-10-05 ENCOUNTER — Ambulatory Visit (INDEPENDENT_AMBULATORY_CARE_PROVIDER_SITE_OTHER): Payer: BC Managed Care – PPO | Admitting: Psychiatry

## 2020-10-05 DIAGNOSIS — F4310 Post-traumatic stress disorder, unspecified: Secondary | ICD-10-CM

## 2020-10-05 DIAGNOSIS — R748 Abnormal levels of other serum enzymes: Secondary | ICD-10-CM

## 2020-10-05 DIAGNOSIS — G4733 Obstructive sleep apnea (adult) (pediatric): Secondary | ICD-10-CM

## 2020-10-05 DIAGNOSIS — F3181 Bipolar II disorder: Secondary | ICD-10-CM

## 2020-10-05 MED ORDER — PRAMIPEXOLE DIHYDROCHLORIDE 0.5 MG PO TABS: 0.5000 mg | ORAL_TABLET | Freq: Two times a day (BID) | ORAL | 5 refills | Status: DC

## 2020-10-05 MED ORDER — LAMOTRIGINE 200 MG PO TABS: 200.0000 mg | ORAL_TABLET | Freq: Every morning | ORAL | 5 refills | Status: DC

## 2020-10-05 NOTE — Progress Notes (Signed)
Edwin [name] [id number] 1956/01/22 64 y.o.  Subjective:   Patient ID:  Edwin [name] is a 64 y.o. (DOB [birth year]) male.  Chief Complaint:  Chief Complaint  Patient presents with  . Follow-up    bipolar and meds    Depression        Associated symptoms include no decreased concentration and no suicidal ideas.  [name] presents to the office today for follow-up of bipolar depression and anxiety.  seen July 02, 2019.  No meds were changed.  Patient was doing well.  He asked me to write a letter to his attorney and support his intention to marry a woman from [place] and that his mental health was sufficiently well that he would not represent a danger to his fiance.  This was discussed with his attorney and the letter was written.  Met girl June 8. Met her on dating site international.  [place] woman and decided wanted to get married.  Options opening up after being shut down by Covid.  seen November 2020.  No meds were changed.  He was overall doing well.  seen January 20, 2020 .  No med changed.  Following noted. Wonders about elevated liver enzymes and Depakote.  Labs not visible in Epic.  PCP Dr. Gavin Pound is working up this finding.   Doing great from mental health.  Talks daily to GF in [place].    03/23/20 the following is noted:    Taken on too many students.  Recognizes needs to have 2 days off a week and plans to schedule that.  Needs to get through end of the semester.  Fiance visa is difficult but she just got admitted to [facility] pending some exams being passed.  Visa difficult with Covid affecting travel.  She could be here in mid-July. No med changes  07/06/20 with the following noted: Still good. Pramipexole has kept depression at [place]. Mood is still stable. Good interest in basketball. Still concerned about weight and blames meds and wonders about weight loss meds and supplements.  Asked about L-glutamine taken it for a week.  Trying to watch diet.  Hasn't  weighed himself. Plan: No med changes indicated yet.  Sig risk in switching his primary mood stabilizer unless its absolutely nececessary.  10/05/20 appt noted: Consistent with meds.  Still doing great overall.  Some trouble sleeping and got help with CPAP with Dr. Maxwell Caul.  Tendency to sleep 3 hours and awakened.  May stay awake 1-2 hours and eventually get sufficient sleep.  Can awaken refreshed. No SE except weight gain.   Got denied for Ozempic.  Very concerned about weight gain with Depakote. Still facetiming with GF in [place] and no visa for her yet bc country still shut down.  Otherwise, Patient reports stable mood and denies depressed or irritable moods.   Patient denies any recent difficulty with anxiety.  Patient denies difficulty with sleep initiation or maintenance.  Was down to 4-5 hours nightly and called sleep doc Maxwell Caul. Sleep hygiene is better now and that' s helped.   Denies appetite disturbance.  Patient reports that energy and motivation have been good.  Patient denies any difficulty with concentration.  Patient denies any suicidal ideation.  No alcohol.    M died 2019/07/20.  Was a relief for her.    Past Psychiatric Medication Trials: Lithium increased Cr and switch to VPA 2012,  Depakote 1000, lamotrigine,  gabapentin, Vraylar tremor, Rexulti SE, Abilify increased weight,   Wellbutrin NR,  phentermine,    Review of Systems:  Review of Systems  Cardiovascular: Negative for palpitations.  Musculoskeletal: Positive for arthralgias.  Neurological: Negative for tremors and weakness.  Psychiatric/Behavioral: Positive for depression. Negative for agitation, behavioral problems, confusion, decreased concentration, hallucinations, self-injury, sleep disturbance and suicidal ideas. The patient is not hyperactive.   No longer depressed.   Medications: I have reviewed the patient's current medications.  Current Outpatient Medications  Medication Sig Dispense Refill   . Cholecalciferol 1000 units TBDP Take 1 tablet by mouth daily.     . divalproex (DEPAKOTE ER) 500 MG 24 hr tablet TAKE 2 TABLETS AT BEDTIME. 60 tablet 5  . DULoxetine (CYMBALTA) 60 MG capsule Take 2 capsules (120 mg total) by mouth daily. 60 capsule 5  . febuxostat (ULORIC) 40 MG tablet Take 40 mg by mouth daily.    . furosemide (LASIX) 20 MG tablet     . lamoTRIgine (LAMICTAL) 200 MG tablet Take 1 tablet (200 mg total) by mouth every morning. 30 tablet 5  . LevOCARNitine (L-CARNITINE) 500 MG TABS Take 500 mg by mouth 2 (two) times daily.    [name] losartan (COZAAR) 50 MG tablet Take 1 tablet (50 mg total) by mouth daily. 30 tablet 0  . Multiple Vitamins-Minerals (CENTRUM ADULTS) TABS Take by mouth.    . multivitamin-lutein (OCUVITE-LUTEIN) CAPS capsule Take 1 capsule by mouth daily.    . pramipexole (MIRAPEX) 0.5 MG tablet Take 1 tablet (0.5 mg total) by mouth 2 (two) times daily. 60 tablet 5  . psyllium (METAMUCIL) 58.6 % powder Take 1 packet by mouth daily.     . Turmeric (QC TUMERIC COMPLEX) 500 MG CAPS Take by mouth.    . zinc gluconate 50 MG tablet Take 50 mg by mouth daily.     No current facility-administered medications for this visit.    Medication Side Effects: Other: weight gain.  Allergies:  Allergies  Allergen Reactions  . Allopurinol Other (See Comments)    Made pt emotionally unstable   . Penicillins Hives and Other (See Comments)    Has patient had a PCN reaction causing immediate rash, facial/tongue/throat swelling, SOB or lightheadedness with hypotension: No Has patient had a PCN reaction causing severe rash involving mucus membranes or skin necrosis: No Has patient had a PCN reaction that required hospitalization No Has patient had a PCN reaction occurring within the last 10 years: No If all of the above answers are "NO", then may proceed with Cephalosporin use.  . Aspirin Hives  . Ciprofloxacin Rash    Past Medical History:  Diagnosis Date  . Allergic rhinitis    . Bipolar disorder ([place])   . 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 CKD (chronic kidney disease) stage 2, GFR 60-89 ml/min   . Gout   . Hypertension   . Mild renal insufficiency    , with creatinine of 1.3 in 2010, was side effect of med  . Obesity    ,moderate  . Prostatitis    , Episodic  . Suicide attempt ([place]) 09/20/2014    Family History  Problem Relation Age of Onset  . Hypertension Father   . Gout Father   . Alzheimer's disease Father   . Other Mother        Viral Meningitis  . Mitral valve prolapse Mother   . Arrhythmia Mother   . Hypothyroidism Mother     Social History   Socioeconomic History  . Marital status: Single    Spouse  name: Not on file  . Number of children: Not on file  . Years of education: Not on file  . Highest education level: Not on file  Occupational History  . Not on file  Tobacco Use  . Smoking status: Never Smoker  . Smokeless tobacco: Never Used  Substance and Sexual Activity  . Alcohol use: No  . Drug use: No  . Sexual activity: Not on file  Other Topics Concern  . Not on file  Social History Narrative  . Not on file   Social Determinants of Health   Financial Resource Strain:   . Difficulty of Paying Living Expenses: Not on file  Food Insecurity:   . Worried About Charity fundraiser in the Last Year: Not on file  . Ran Out of Food in the Last Year: Not on file  Transportation Needs:   . Lack of Transportation (Medical): Not on file  . Lack of Transportation (Non-Medical): Not on file  Physical Activity:   . Days of Exercise per Week: Not on file  . Minutes of Exercise per Session: Not on file  Stress:   . Feeling of Stress : Not on file  Social Connections:   . Frequency of Communication with Friends and Family: Not on file  . Frequency of Social Gatherings with Friends and Family: Not on file  . Attends Religious Services: Not on file  . Active Member of Clubs or Organizations: Not on file  . Attends Theatre manager Meetings: Not on file  . Marital Status: Not on file  Intimate Partner Violence:   . Fear of Current or Ex-Partner: Not on file  . Emotionally Abused: Not on file  . Physically Abused: Not on file  . Sexually Abused: Not on file    Past Medical History, Surgical history, Social history, and Family history were reviewed and updated as appropriate.   Please see review of systems for further details on the patient's review from today.   Objective:   Physical Exam:  There were no vitals taken for this visit.  Physical Exam Constitutional:      General: He is not in acute distress.    Appearance: He is well-developed.  Musculoskeletal:        General: No deformity.  Neurological:     Mental Status: He is alert and oriented to person, place, and time.     Motor: No tremor.     Coordination: Coordination normal.     Gait: Gait normal.  Psychiatric:        Attention and Perception: Attention normal. He is attentive.        Mood and Affect: Mood is not anxious or depressed. Affect is not labile, blunt, angry or inappropriate.        Behavior: Behavior normal.        Thought Content: Thought content normal. Thought content does not include homicidal or suicidal ideation. Thought content does not include homicidal or suicidal plan.        Cognition and Memory: Cognition normal.        Judgment: Judgment normal.     Comments: Insight is good.  Some situational anxiety managed     Lab Review:     Component Value Date/Time   NA 140 02/04/2017 0525   K 4.1 02/04/2017 0525   CL 105 02/04/2017 0525   CO2 27 02/04/2017 0525   GLUCOSE 120 (H) 02/04/2017 0525   BUN 10 02/04/2017 0525   CREATININE 1.07  02/04/2017 0525   CALCIUM 9.2 02/04/2017 0525   PROT 6.9 02/01/2017 0152   ALBUMIN 3.8 02/01/2017 0152   AST 21 02/01/2017 0152   ALT 27 02/01/2017 0152   ALKPHOS 56 02/01/2017 0152   BILITOT 0.6 02/01/2017 0152   GFRNONAA >60 02/04/2017 0525   GFRAA >60 02/04/2017  0525       Component Value Date/Time   WBC 7.4 02/03/2017 0527   RBC 3.48 (L) 02/03/2017 0527   HGB 10.8 (L) 02/03/2017 0527   HCT 31.1 (L) 02/03/2017 0527   PLT 253 02/03/2017 0527   MCV 89.4 02/03/2017 0527   MCH 31.0 02/03/2017 0527   MCHC 34.7 02/03/2017 0527   RDW 13.6 02/03/2017 0527   LYMPHSABS 1.7 02/01/2017 0205   MONOABS 2.2 (H) 02/01/2017 0205   EOSABS 0.1 02/01/2017 0205   BASOSABS 0.2 (H) 02/01/2017 0205    No results found for: POCLITH, LITHIUM   Lab Results  Component Value Date   VALPROATE <10.0 (L) 09/22/2014    Sleep study dx severe OSA AHI 53.   .res Assessment: Plan:    Renne was seen today for follow-up.  Diagnoses and all orders for this visit:  Bipolar II disorder ([place]) -     lamoTRIgine (LAMICTAL) 200 MG tablet; Take 1 tablet (200 mg total) by mouth every morning. -     pramipexole (MIRAPEX) 0.5 MG tablet; Take 1 tablet (0.5 mg total) by mouth 2 (two) times daily.  PTSD (post-traumatic stress disorder)  Obstructive sleep apnea  Elevated liver enzymes  Greater than 50% of 40 min face to face time with patient was spent on counseling and coordination of care. We discussed  Mood remains stable but concerns over elevated liver enzymes.  Labs not available.  Switch from lithium to VPA in 2012 was followed up with CMP which was normal at the time except for elevated ammonia level.  Which was the reason for adding L-carnitine and this corrected the ammonia level.  Of his current meds the most likely potential cause of elevated liver enzymes would be VPA but of course there are other potential causes such as fatty liver, etc.  He wonders about switching off Depakote.  However Depakote is the primary mood stabilizer that is preventing mania.  If we stop Depakote he will have to be switched to another mood stabilizer.  The only nonantipsychotic mood stabilizer left would be carbamazepine and that causes multiple drug interaction issues.  Switching to an  antipsychotic type mood stabilizer could render the off label use of the pramipexole ineffective posing a risk of relapse of depression as well as the typical potential side effects of antipsychotics.  Therefore it would be preferable not to switch off Depakote unless there is no other option.  We will gather information from his primary care doctors including labs to look at liver enzyme levels over the last few years as well as his current Dr. Gavin Pound who found the most recent liver enzyme elevation.  We will use this information to make determination of the next step.  Supportive therapy dealing with his go to get married.    Disc value of music for his mental health.   Good response to the med combination.  Polypharmacy necessary.   Since 2017 on pramipexole has been much better with regard to mood stability.  Disc weight loss strategies.  Disc Ozempic as potential for weight loss.  Gave JAMA abstract.   Disc Wegovy.  Disc weight losss at length.  Using CPAP consistently and he requires less sleep.  Disc sleep hygiene and getting adequate quantity for mental health.  Sleep deprivation increases risk mania. Disc types of sleep masks.  It makes a big difference and doesn't leak to much.    No med changes indicated yet.  Sig risk in switchin g his primary mood stabilizer unless its absolutely nececessary.  Monitor BP at home as key to manage elevated Cr.  Disc how to monitor this.    Extensive discussion of his concerns about Cr. And liver enzymes  Which are mildly elevated but only recently after years of being on Depakote and not correlated with timing of Depakote use. Sees PCP in August about these to follow up.    30 min  FU 3 mos  Lynder Parents, MD, DFAPA   Please see After Visit Summary for patient specific instructions.  No future appointments.  No orders of the defined types were placed in this encounter.     -------------------------------

## 2020-10-07 DIAGNOSIS — E291 Testicular hypofunction: Secondary | ICD-10-CM | POA: Diagnosis not present

## 2020-10-18 DIAGNOSIS — L821 Other seborrheic keratosis: Secondary | ICD-10-CM | POA: Diagnosis not present

## 2020-10-18 DIAGNOSIS — L820 Inflamed seborrheic keratosis: Secondary | ICD-10-CM | POA: Diagnosis not present

## 2020-10-18 DIAGNOSIS — D225 Melanocytic nevi of trunk: Secondary | ICD-10-CM | POA: Diagnosis not present

## 2020-10-18 DIAGNOSIS — D1801 Hemangioma of skin and subcutaneous tissue: Secondary | ICD-10-CM | POA: Diagnosis not present

## 2020-10-21 DIAGNOSIS — E291 Testicular hypofunction: Secondary | ICD-10-CM | POA: Diagnosis not present

## 2020-10-25 DIAGNOSIS — M7989 Other specified soft tissue disorders: Secondary | ICD-10-CM | POA: Diagnosis not present

## 2020-10-25 DIAGNOSIS — I10 Essential (primary) hypertension: Secondary | ICD-10-CM | POA: Diagnosis not present

## 2020-10-27 DIAGNOSIS — L905 Scar conditions and fibrosis of skin: Secondary | ICD-10-CM | POA: Diagnosis not present

## 2020-10-27 DIAGNOSIS — D225 Melanocytic nevi of trunk: Secondary | ICD-10-CM | POA: Diagnosis not present

## 2020-10-27 DIAGNOSIS — D485 Neoplasm of uncertain behavior of skin: Secondary | ICD-10-CM | POA: Diagnosis not present

## 2020-10-27 DIAGNOSIS — L738 Other specified follicular disorders: Secondary | ICD-10-CM | POA: Diagnosis not present

## 2020-10-27 DIAGNOSIS — L821 Other seborrheic keratosis: Secondary | ICD-10-CM | POA: Diagnosis not present

## 2020-10-27 DIAGNOSIS — L820 Inflamed seborrheic keratosis: Secondary | ICD-10-CM | POA: Diagnosis not present

## 2020-10-27 DIAGNOSIS — Z85828 Personal history of other malignant neoplasm of skin: Secondary | ICD-10-CM | POA: Diagnosis not present

## 2020-11-04 DIAGNOSIS — R739 Hyperglycemia, unspecified: Secondary | ICD-10-CM | POA: Diagnosis not present

## 2020-11-04 DIAGNOSIS — I10 Essential (primary) hypertension: Secondary | ICD-10-CM | POA: Diagnosis not present

## 2020-11-04 DIAGNOSIS — Z13220 Encounter for screening for lipoid disorders: Secondary | ICD-10-CM | POA: Diagnosis not present

## 2020-11-04 DIAGNOSIS — E291 Testicular hypofunction: Secondary | ICD-10-CM | POA: Diagnosis not present

## 2020-11-04 DIAGNOSIS — R7989 Other specified abnormal findings of blood chemistry: Secondary | ICD-10-CM | POA: Diagnosis not present

## 2020-11-07 DIAGNOSIS — G4733 Obstructive sleep apnea (adult) (pediatric): Secondary | ICD-10-CM | POA: Diagnosis not present

## 2020-11-25 DIAGNOSIS — E291 Testicular hypofunction: Secondary | ICD-10-CM | POA: Diagnosis not present

## 2020-12-08 DIAGNOSIS — R600 Localized edema: Secondary | ICD-10-CM | POA: Diagnosis not present

## 2020-12-08 DIAGNOSIS — Z713 Dietary counseling and surveillance: Secondary | ICD-10-CM | POA: Diagnosis not present

## 2020-12-08 DIAGNOSIS — I10 Essential (primary) hypertension: Secondary | ICD-10-CM | POA: Diagnosis not present

## 2020-12-08 DIAGNOSIS — E669 Obesity, unspecified: Secondary | ICD-10-CM | POA: Diagnosis not present

## 2020-12-08 DIAGNOSIS — R7989 Other specified abnormal findings of blood chemistry: Secondary | ICD-10-CM | POA: Diagnosis not present

## 2020-12-08 DIAGNOSIS — E291 Testicular hypofunction: Secondary | ICD-10-CM | POA: Diagnosis not present

## 2020-12-08 DIAGNOSIS — E1121 Type 2 diabetes mellitus with diabetic nephropathy: Secondary | ICD-10-CM | POA: Diagnosis not present

## 2020-12-21 DIAGNOSIS — G4733 Obstructive sleep apnea (adult) (pediatric): Secondary | ICD-10-CM | POA: Diagnosis not present

## 2020-12-23 DIAGNOSIS — E291 Testicular hypofunction: Secondary | ICD-10-CM | POA: Diagnosis not present

## 2021-01-05 ENCOUNTER — Ambulatory Visit (INDEPENDENT_AMBULATORY_CARE_PROVIDER_SITE_OTHER): Payer: BC Managed Care – PPO | Admitting: Psychiatry

## 2021-01-05 ENCOUNTER — Other Ambulatory Visit: Payer: Self-pay

## 2021-01-05 ENCOUNTER — Encounter: Payer: Self-pay | Admitting: Psychiatry

## 2021-01-05 DIAGNOSIS — G4733 Obstructive sleep apnea (adult) (pediatric): Secondary | ICD-10-CM | POA: Diagnosis not present

## 2021-01-05 DIAGNOSIS — F3181 Bipolar II disorder: Secondary | ICD-10-CM

## 2021-01-05 DIAGNOSIS — F4310 Post-traumatic stress disorder, unspecified: Secondary | ICD-10-CM | POA: Diagnosis not present

## 2021-01-05 MED ORDER — DULOXETINE HCL 60 MG PO CPEP: 120.0000 mg | ORAL_CAPSULE | Freq: Every day | ORAL | 5 refills | Status: DC

## 2021-01-05 NOTE — Progress Notes (Signed)
Edwin [name] [id number] Sep 28, 1956 65 y.o.  Subjective:   Patient ID:  Edwin [name] is a 65 y.o. (DOB 10-20-1956) male.  Chief Complaint:  Chief Complaint  Patient presents with  . Follow-up  . Depression  . Anxiety    Depression        Associated symptoms include no decreased concentration and no suicidal ideas.  Edwin [name] presents to the office today for follow-up of bipolar depression and anxiety.  seen July 02, 2019.  No meds were changed.  Patient was doing well.  He asked me to write a letter to his attorney and support his intention to marry a woman from [place] and that his mental health was sufficiently well that he would not represent a danger to his fiance.  This was discussed with his attorney and the letter was written.  Met girl June 8. Met her on dating site international.  [place] woman and decided wanted to get married.  Options opening up after being shut down by Covid.  seen November 2020.  No meds were changed.  He was overall doing well.  seen January 20, 2020 .  No med changed.  Following noted. Wonders about elevated liver enzymes and Depakote.  Labs not visible in Epic.  PCP Dr. Gavin Pound is working up this finding.   Doing great from mental health.  Talks daily to GF in [place].    03/23/20 the following is noted:    Taken on too many students.  Recognizes needs to have 2 days off a week and plans to schedule that.  Needs to get through end of the semester.  Fiance visa is difficult but she just got admitted to [facility] pending some exams being passed.  Visa difficult with Covid affecting travel.  She could be here in mid-July. No med changes  07/06/20 with the following noted: Still good. Pramipexole has kept depression at [place]. Mood is still stable. Good interest in basketball. Still concerned about weight and blames meds and wonders about weight loss meds and supplements.  Asked about L-glutamine taken it for a week.  Trying to watch diet.   Hasn't weighed himself. Plan: No med changes indicated yet.  Sig risk in switching his primary mood stabilizer unless its absolutely nececessary.  10/05/20 appt noted: Consistent with meds.  Still doing great overall.  Some trouble sleeping and got help with CPAP with Dr. Maxwell Caul.  Tendency to sleep 3 hours and awakened.  May stay awake 1-2 hours and eventually get sufficient sleep.  Can awaken refreshed. No SE except weight gain.   Got denied for Ozempic.  Very concerned about weight gain with Depakote. Still facetiming with GF in [place] and no visa for her yet bc country still shut down. Plan no med changes  01/05/2021 appointment with the following noted: Expressed concerns about weight and new dx DM.   Disc retrying efforts to get Ozempic.   Has increase glucose.   Been on phone with GF in [place] for 20 mos.  It's dragging on trying to get her here.  Thinking about going there.   Previously lost the love of his life DT his illness.  Sense of loss bc her birthday was a couple of days ago.    Otherwise, Patient reports stable mood and denies depressed or irritable moods.   Patient denies any recent difficulty with anxiety.  Patient denies difficulty with sleep initiation or maintenance.  Using CPAP.[name] Sleep hygiene is better now and that' s helped.  Denies appetite disturbance.  Patient reports that energy and motivation have been good.  Patient denies any difficulty with concentration.  Patient denies any suicidal ideation.  No alcohol.    M died 08-04-19.  Was a relief for her.    Past Psychiatric Medication Trials: Lithium increased Cr and switch to VPA 2012,  Depakote 1000, lamotrigine,  gabapentin, Vraylar tremor, Rexulti SE, Abilify increased weight,   Wellbutrin NR, phentermine,    Review of Systems:  Review of Systems  Constitutional: Positive for unexpected weight change.  Cardiovascular: Negative for palpitations.  Musculoskeletal: Positive for arthralgias.   Neurological: Negative for tremors and weakness.  Psychiatric/Behavioral: Positive for depression. Negative for agitation, behavioral problems, confusion, decreased concentration, hallucinations, self-injury, sleep disturbance and suicidal ideas. The patient is not hyperactive.   No longer depressed.   Medications: I have reviewed the patient's current medications.  Current Outpatient Medications  Medication Sig Dispense Refill  . Cholecalciferol 1000 units TBDP Take 1 tablet by mouth daily.     . divalproex (DEPAKOTE ER) 500 MG 24 hr tablet TAKE 2 TABLETS AT BEDTIME. 60 tablet 5  . febuxostat (ULORIC) 40 MG tablet Take 40 mg by mouth daily.    . furosemide (LASIX) 20 MG tablet     . lamoTRIgine (LAMICTAL) 200 MG tablet Take 1 tablet (200 mg total) by mouth every morning. 30 tablet 5  . LevOCARNitine (L-CARNITINE) 500 MG TABS Take 500 mg by mouth 2 (two) times daily.    [name] losartan (COZAAR) 50 MG tablet Take 1 tablet (50 mg total) by mouth daily. 30 tablet 0  . Multiple Vitamins-Minerals (CENTRUM ADULTS) TABS Take by mouth.    . multivitamin-lutein (OCUVITE-LUTEIN) CAPS capsule Take 1 capsule by mouth daily.    . pramipexole (MIRAPEX) 0.5 MG tablet Take 1 tablet (0.5 mg total) by mouth 2 (two) times daily. 60 tablet 5  . psyllium (METAMUCIL) 58.6 % powder Take 1 packet by mouth daily.     . Turmeric 500 MG CAPS Take by mouth.    . zinc gluconate 50 MG tablet Take 50 mg by mouth daily.    . DULoxetine (CYMBALTA) 60 MG capsule Take 2 capsules (120 mg total) by mouth daily. 60 capsule 5   No current facility-administered medications for this visit.    Medication Side Effects: Other: weight gain.  Allergies:  Allergies  Allergen Reactions  . Allopurinol Other (See Comments)    Made pt emotionally unstable   . Penicillins Hives and Other (See Comments)    Has patient had a PCN reaction causing immediate rash, facial/tongue/throat swelling, SOB or lightheadedness with hypotension:  No Has patient had a PCN reaction causing severe rash involving mucus membranes or skin necrosis: No Has patient had a PCN reaction that required hospitalization No Has patient had a PCN reaction occurring within the last 10 years: No If all of the above answers are "NO", then may proceed with Cephalosporin use.  . Aspirin Hives  . Ciprofloxacin Rash    Past Medical History:  Diagnosis Date  . Allergic rhinitis   . Bipolar disorder ([place])   . 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 CKD (chronic kidney disease) stage 2, GFR 60-89 ml/min   . Gout   . Hypertension   . Mild renal insufficiency    , with creatinine of 1.3 in 2010, was side effect of med  . Obesity    ,moderate  . Prostatitis    , Episodic  . Suicide  attempt ([place]) 09/20/2014    Family History  Problem Relation Age of Onset  . Hypertension Father   . Gout Father   . Alzheimer's disease Father   . Other Mother        Viral Meningitis  . Mitral valve prolapse Mother   . Arrhythmia Mother   . Hypothyroidism Mother     Social History   Socioeconomic History  . Marital status: Single    Spouse name: Not on file  . Number of children: Not on file  . Years of education: Not on file  . Highest education level: Not on file  Occupational History  . Not on file  Tobacco Use  . Smoking status: Never Smoker  . Smokeless tobacco: Never Used  Substance and Sexual Activity  . Alcohol use: No  . Drug use: No  . Sexual activity: Not on file  Other Topics Concern  . Not on file  Social History Narrative  . Not on file   Social Determinants of Health   Financial Resource Strain: Not on file  Food Insecurity: Not on file  Transportation Needs: Not on file  Physical Activity: Not on file  Stress: Not on file  Social Connections: Not on file  Intimate Partner Violence: Not on file    Past Medical History, Surgical history, Social history, and Family history were reviewed and updated as appropriate.   Please  see review of systems for further details on the patient's review from today.   Objective:   Physical Exam:  There were no vitals taken for this visit.  Physical Exam Constitutional:      General: He is not in acute distress.    Appearance: He is well-developed.  Musculoskeletal:        General: No deformity.  Neurological:     Mental Status: He is alert and oriented to person, place, and time.     Motor: No tremor.     Coordination: Coordination normal.     Gait: Gait normal.  Psychiatric:        Attention and Perception: Attention normal. He is attentive.        Mood and Affect: Mood is not anxious or depressed. Affect is not labile, blunt, angry or inappropriate.        Behavior: Behavior normal.        Thought Content: Thought content normal. Thought content does not include homicidal or suicidal ideation. Thought content does not include homicidal or suicidal plan.        Cognition and Memory: Cognition normal.        Judgment: Judgment normal.     Comments: Insight is good.  Some situational anxiety managed & some frustration with situation with GF     Lab Review:     Component Value Date/Time   NA 140 02/04/2017 0525   K 4.1 02/04/2017 0525   CL 105 02/04/2017 0525   CO2 27 02/04/2017 0525   GLUCOSE 120 (H) 02/04/2017 0525   BUN 10 02/04/2017 0525   CREATININE 1.07 02/04/2017 0525   CALCIUM 9.2 02/04/2017 0525   PROT 6.9 02/01/2017 0152   ALBUMIN 3.8 02/01/2017 0152   AST 21 02/01/2017 0152   ALT 27 02/01/2017 0152   ALKPHOS 56 02/01/2017 0152   BILITOT 0.6 02/01/2017 0152   GFRNONAA >60 02/04/2017 0525   GFRAA >60 02/04/2017 0525       Component Value Date/Time   WBC 7.4 02/03/2017 0527   RBC 3.48 (L) 02/03/2017 8466  HGB 10.8 (L) 02/03/2017 0527   HCT 31.1 (L) 02/03/2017 0527   PLT 253 02/03/2017 0527   MCV 89.4 02/03/2017 0527   MCH 31.0 02/03/2017 0527   MCHC 34.7 02/03/2017 0527   RDW 13.6 02/03/2017 0527   LYMPHSABS 1.7 02/01/2017 0205    MONOABS 2.2 (H) 02/01/2017 0205   EOSABS 0.1 02/01/2017 0205   BASOSABS 0.2 (H) 02/01/2017 0205    No results found for: POCLITH, LITHIUM   Lab Results  Component Value Date   VALPROATE <10.0 (L) 09/22/2014    Sleep study dx severe OSA AHI 53.   .res Assessment: Plan:    Kolbe was seen today for follow-up, depression and anxiety.  Diagnoses and all orders for this visit:  Bipolar II disorder ([place])  PTSD (post-traumatic stress disorder) -     DULoxetine (CYMBALTA) 60 MG capsule; Take 2 capsules (120 mg total) by mouth daily.  Obstructive sleep apnea  Greater than 50% of 40 min face to face time with patient was spent on counseling and coordination of care. We discussed  Mood remains stable but concerns over elevated liver enzymes.  Labs not available.  Switch from lithium to VPA in 2012 was followed up with CMP which was normal at the time except for elevated ammonia level.  Which was the reason for adding L-carnitine and this corrected the ammonia level.  Of his current meds the most likely potential cause of elevated liver enzymes would be VPA but of course there are other potential causes such as fatty liver, etc.  He wonders about switching off Depakote.  However Depakote is the primary mood stabilizer that is preventing mania.  If we stop Depakote he will have to be switched to another mood stabilizer.  The only nonantipsychotic mood stabilizer left would be carbamazepine and that causes multiple drug interaction issues.  Switching to an antipsychotic type mood stabilizer could render the off label use of the pramipexole ineffective posing a risk of relapse of depression as well as the typical potential side effects of antipsychotics.  Therefore it would be preferable not to switch off Depakote unless there is no other option.  We will gather information from his primary care doctors including labs to look at liver enzyme levels over the last few years as well as his current Dr.  Gavin Pound who found the most recent liver enzyme elevation.  We will use this information to make determination of the next step.  Supportive therapy dealing with his go to get married.    Disc value of music for his mental health.   Good response to the med combination.  Polypharmacy necessary.   Since 2017 on pramipexole has been much better with regard to mood stability.  He feels it causes weight gain but cannot maintain stability bc of it.  Disc weight loss strategies.  Again Disc Ozempic as potential for weight loss.  Gave JAMA abstract.   Disc Wegovy.  Disc weight losss at length.   Couldn't get it last year but may be able to get it this year with new formularies.  Disc diet and need to control blood sugar and weight loss are all needed.  Using CPAP consistently and he requires less sleep.  Disc sleep hygiene and getting adequate quantity for mental health.  Sleep deprivation increases risk mania. Disc types of sleep masks.  It makes a big difference and doesn't leak to much.    No med changes indicated yet.  Sig risk in switchin g  his primary mood stabilizer unless its absolutely nececessary.  Monitor BP at home as key to manage elevated Cr.  Disc how to monitor this.    Extensive discussion of his concerns about Cr. And liver enzymes  Which are mildly elevated but only recently after years of being on Depakote and not correlated with timing of Depakote use. Sees PCP in August about these to follow up.    30 min  FU 3-4 mos  Lynder Parents, MD, DFAPA   Please see After Visit Summary for patient specific instructions.  No future appointments.  No orders of the defined types were placed in this encounter.     -------------------------------

## 2021-01-06 DIAGNOSIS — E291 Testicular hypofunction: Secondary | ICD-10-CM | POA: Diagnosis not present

## 2021-01-19 DIAGNOSIS — E291 Testicular hypofunction: Secondary | ICD-10-CM | POA: Diagnosis not present

## 2021-01-19 DIAGNOSIS — R7989 Other specified abnormal findings of blood chemistry: Secondary | ICD-10-CM | POA: Diagnosis not present

## 2021-01-19 DIAGNOSIS — I10 Essential (primary) hypertension: Secondary | ICD-10-CM | POA: Diagnosis not present

## 2021-01-19 DIAGNOSIS — R600 Localized edema: Secondary | ICD-10-CM | POA: Diagnosis not present

## 2021-01-20 DIAGNOSIS — E291 Testicular hypofunction: Secondary | ICD-10-CM | POA: Diagnosis not present

## 2021-01-26 DIAGNOSIS — Z713 Dietary counseling and surveillance: Secondary | ICD-10-CM | POA: Diagnosis not present

## 2021-02-03 DIAGNOSIS — E291 Testicular hypofunction: Secondary | ICD-10-CM | POA: Diagnosis not present

## 2021-02-17 DIAGNOSIS — E291 Testicular hypofunction: Secondary | ICD-10-CM | POA: Diagnosis not present

## 2021-02-21 DIAGNOSIS — M1009 Idiopathic gout, multiple sites: Secondary | ICD-10-CM | POA: Diagnosis not present

## 2021-03-01 DIAGNOSIS — Z79899 Other long term (current) drug therapy: Secondary | ICD-10-CM | POA: Diagnosis not present

## 2021-03-01 DIAGNOSIS — Z125 Encounter for screening for malignant neoplasm of prostate: Secondary | ICD-10-CM | POA: Diagnosis not present

## 2021-03-01 DIAGNOSIS — I10 Essential (primary) hypertension: Secondary | ICD-10-CM | POA: Diagnosis not present

## 2021-03-01 DIAGNOSIS — E1121 Type 2 diabetes mellitus with diabetic nephropathy: Secondary | ICD-10-CM | POA: Diagnosis not present

## 2021-03-01 DIAGNOSIS — Z0000 Encounter for general adult medical examination without abnormal findings: Secondary | ICD-10-CM | POA: Diagnosis not present

## 2021-03-01 DIAGNOSIS — E291 Testicular hypofunction: Secondary | ICD-10-CM | POA: Diagnosis not present

## 2021-03-01 DIAGNOSIS — R7989 Other specified abnormal findings of blood chemistry: Secondary | ICD-10-CM | POA: Diagnosis not present

## 2021-03-01 DIAGNOSIS — M109 Gout, unspecified: Secondary | ICD-10-CM | POA: Diagnosis not present

## 2021-03-01 DIAGNOSIS — E559 Vitamin D deficiency, unspecified: Secondary | ICD-10-CM | POA: Diagnosis not present

## 2021-03-17 ENCOUNTER — Other Ambulatory Visit: Payer: Self-pay | Admitting: Psychiatry

## 2021-03-17 DIAGNOSIS — F4310 Post-traumatic stress disorder, unspecified: Secondary | ICD-10-CM

## 2021-03-17 DIAGNOSIS — E291 Testicular hypofunction: Secondary | ICD-10-CM | POA: Diagnosis not present

## 2021-03-17 DIAGNOSIS — F3181 Bipolar II disorder: Secondary | ICD-10-CM

## 2021-03-21 DIAGNOSIS — Z713 Dietary counseling and surveillance: Secondary | ICD-10-CM | POA: Diagnosis not present

## 2021-03-29 DIAGNOSIS — R0683 Snoring: Secondary | ICD-10-CM | POA: Diagnosis not present

## 2021-03-29 DIAGNOSIS — G4733 Obstructive sleep apnea (adult) (pediatric): Secondary | ICD-10-CM | POA: Diagnosis not present

## 2021-03-29 DIAGNOSIS — G4710 Hypersomnia, unspecified: Secondary | ICD-10-CM | POA: Diagnosis not present

## 2021-03-31 DIAGNOSIS — E291 Testicular hypofunction: Secondary | ICD-10-CM | POA: Diagnosis not present

## 2021-04-07 DIAGNOSIS — Z1211 Encounter for screening for malignant neoplasm of colon: Secondary | ICD-10-CM | POA: Diagnosis not present

## 2021-04-14 DIAGNOSIS — E291 Testicular hypofunction: Secondary | ICD-10-CM | POA: Diagnosis not present

## 2021-04-28 DIAGNOSIS — E291 Testicular hypofunction: Secondary | ICD-10-CM | POA: Diagnosis not present

## 2021-05-03 ENCOUNTER — Ambulatory Visit: Payer: BC Managed Care – PPO | Admitting: Psychiatry

## 2021-05-03 ENCOUNTER — Other Ambulatory Visit: Payer: Self-pay

## 2021-05-03 ENCOUNTER — Encounter: Payer: Self-pay | Admitting: Psychiatry

## 2021-05-03 DIAGNOSIS — G4733 Obstructive sleep apnea (adult) (pediatric): Secondary | ICD-10-CM | POA: Diagnosis not present

## 2021-05-03 DIAGNOSIS — F3181 Bipolar II disorder: Secondary | ICD-10-CM

## 2021-05-03 DIAGNOSIS — Z713 Dietary counseling and surveillance: Secondary | ICD-10-CM | POA: Diagnosis not present

## 2021-05-03 DIAGNOSIS — F4310 Post-traumatic stress disorder, unspecified: Secondary | ICD-10-CM

## 2021-05-03 MED ORDER — DULOXETINE HCL 60 MG PO CPEP: 120.0000 mg | ORAL_CAPSULE | Freq: Every day | ORAL | 1 refills | Status: DC

## 2021-05-03 MED ORDER — DIVALPROEX SODIUM ER 500 MG PO TB24: 1000.0000 mg | ORAL_TABLET | Freq: Every day | ORAL | 1 refills | Status: DC

## 2021-05-03 MED ORDER — PRAMIPEXOLE DIHYDROCHLORIDE 0.5 MG PO TABS: 0.5000 mg | ORAL_TABLET | Freq: Two times a day (BID) | ORAL | 1 refills | Status: DC

## 2021-05-03 MED ORDER — LAMOTRIGINE 200 MG PO TABS: 200.0000 mg | ORAL_TABLET | Freq: Every morning | ORAL | 1 refills | Status: DC

## 2021-05-03 NOTE — Progress Notes (Signed)
Edwin [name] [id number] [birth year] 65 y.o.  Subjective:   Patient ID:  Edwin [name] is a 65 y.o. (DOB 17-Jul-1956) male.  Chief Complaint:  Chief Complaint  Patient presents with   Follow-up   Bipolar II disorder (Edwin [name])    Depression        Associated symptoms include no decreased concentration and no suicidal ideas. Edwin [name] presents to the office today for follow-up of bipolar depression and anxiety.  seen July 02, 2019.  No meds were changed.  Patient was doing well.  He asked me to write a letter to his attorney and support his intention to marry a woman from [place] and that his mental health was sufficiently well that he would not represent a danger to his fiance.  This was discussed with his attorney and the letter was written.  Met girl June 8. Met her on dating site international.  [place] woman and decided wanted to get married.  Options opening up after being shut down by Covid.  seen November 2020.  No meds were changed.  He was overall doing well.  seen January 20, 2020 .  No med changed.  Following noted. Wonders about elevated liver enzymes and Depakote.  Labs not visible in Epic.  PCP Dr. Gavin Pound is working up this finding.   Doing great from mental health.  Talks daily to GF in [place].    03/23/20 the following is noted:    Taken on too many students.  Recognizes needs to have 2 days off a week and plans to schedule that.  Needs to get through end of the semester.  Fiance visa is difficult but she just got admitted to [facility] pending some exams being passed.  Visa difficult with Covid affecting travel.  She could be here in mid-July. No med changes  07/06/20 with the following noted: Still good. Pramipexole has kept depression at [place]. Mood is still stable. Good interest in basketball. Still concerned about weight and blames meds and wonders about weight loss meds and supplements.  Asked about L-glutamine taken it for a week.  Trying to watch diet.   Hasn't weighed himself. Plan: No med changes indicated yet.  Sig risk in switching his primary mood stabilizer unless its absolutely nececessary.  10/05/20 appt noted: Consistent with meds.  Still doing great overall.  Some trouble sleeping and got help with CPAP with Dr. Maxwell Caul.  Tendency to sleep 3 hours and awakened.  May stay awake 1-2 hours and eventually get sufficient sleep.  Can awaken refreshed. No SE except weight gain.   Got denied for Ozempic.  Very concerned about weight gain with Depakote. Still facetiming with GF in [place] and no visa for her yet bc country still shut down. Plan no med changes  01/05/2021 appointment with the following noted: Expressed concerns about weight and new dx DM.   Disc retrying efforts to get Ozempic.   Has increase glucose.   Been on phone with GF in [place] for 20 mos.  It's dragging on trying to get her here.  Thinking about going there.   Previously lost the love of his life DT his illness.  Sense of loss bc her birthday was a couple of days ago.    05/03/2021 appointment with the following noted: Doing as well as anyone can be doing.  Flew to [place] in April.  People were nice.  Enjoyed the family and went with 27 people to the beach.  GF comes from family of  9.  Going back in July and wedding July 30.  Had Face Timed for 22 mos and gotten to know the family.  She has not been to [place] yet but they plan to live here.  Spousal visas are back logged.   Has seen PCP and dietician and is losing weight.  Patient reports stable mood and denies depressed or irritable moods.   Patient denies any recent difficulty with anxiety.  Patient denies difficulty with sleep initiation or maintenance.  Using CPAP.[name] Sleep hygiene is better now and that' s helped.   Denies appetite disturbance.  Patient reports that energy and motivation have been good.  Patient denies any difficulty with concentration.  Patient denies any suicidal ideation.  No alcohol.    M  died 08/16/19.  Was a relief for her.    Past Psychiatric Medication Trials: Lithium increased Cr and switch to VPA 2012,  Depakote 1000, lamotrigine,  gabapentin, Vraylar tremor, Rexulti SE, Abilify increased weight,   Wellbutrin NR, phentermine,    Review of Systems:  Review of Systems  Constitutional:  Positive for unexpected weight change.  Cardiovascular:  Negative for chest pain and palpitations.  Musculoskeletal:  Positive for arthralgias.  Neurological:  Negative for tremors and weakness.  Psychiatric/Behavioral:  Negative for agitation, behavioral problems, confusion, decreased concentration, hallucinations, self-injury, sleep disturbance and suicidal ideas. The patient is not hyperactive.  No longer depressed.   Medications: I have reviewed the patient's current medications.  Current Outpatient Medications  Medication Sig Dispense Refill   Cholecalciferol 1000 units TBDP Take 1 tablet by mouth daily.      febuxostat (ULORIC) 40 MG tablet Take 40 mg by mouth daily.     hydrALAZINE (APRESOLINE) 100 MG tablet Take 100 mg by mouth 2 (two) times daily.     LevOCARNitine (L-CARNITINE) 500 MG TABS Take 500 mg by mouth 2 (two) times daily.     losartan (COZAAR) 50 MG tablet Take 1 tablet (50 mg total) by mouth daily. 30 tablet 0   metFORMIN (GLUCOPHAGE) 500 MG tablet Take by mouth 2 (two) times daily with a meal.     multivitamin-lutein (OCUVITE-LUTEIN) CAPS capsule Take 1 capsule by mouth daily.     Turmeric 500 MG CAPS Take by mouth.     divalproex (DEPAKOTE ER) 500 MG 24 hr tablet Take 2 tablets (1,000 mg total) by mouth at bedtime. 180 tablet 1   DULoxetine (CYMBALTA) 60 MG capsule Take 2 capsules (120 mg total) by mouth daily. 180 capsule 1   furosemide (LASIX) 20 MG tablet  (Patient not taking: Reported on 05/03/2021)     lamoTRIgine (LAMICTAL) 200 MG tablet Take 1 tablet (200 mg total) by mouth every morning. 90 tablet 1   Multiple Vitamins-Minerals (CENTRUM ADULTS) TABS  Take by mouth. (Patient not taking: Reported on 05/03/2021)     pramipexole (MIRAPEX) 0.5 MG tablet Take 1 tablet (0.5 mg total) by mouth 2 (two) times daily. 180 tablet 1   psyllium (METAMUCIL) 58.6 % powder Take 1 packet by mouth daily.  (Patient not taking: Reported on 05/03/2021)     zinc gluconate 50 MG tablet Take 50 mg by mouth daily. (Patient not taking: Reported on 05/03/2021)     No current facility-administered medications for this visit.    Medication Side Effects: Other: weight gain.  Allergies:  Allergies  Allergen Reactions   Allopurinol Other (See Comments)    Made pt emotionally unstable    Penicillins Hives and Other (See Comments)  Has patient had a PCN reaction causing immediate rash, facial/tongue/throat swelling, SOB or lightheadedness with hypotension: No Has patient had a PCN reaction causing severe rash involving mucus membranes or skin necrosis: No Has patient had a PCN reaction that required hospitalization No Has patient had a PCN reaction occurring within the last 10 years: No If all of the above answers are "NO", then may proceed with Cephalosporin use.   Aspirin Hives   Ciprofloxacin Rash    Past Medical History:  Diagnosis Date   Allergic rhinitis    Bipolar disorder ([place])    Bradycardia 2012    due to lithium   CKD (chronic kidney disease) stage 2, GFR 60-89 ml/min    Gout    Hypertension    Mild renal insufficiency    , with creatinine of 1.3 in 2010, was side effect of med   Obesity    ,moderate   Prostatitis    , Episodic   Suicide attempt ([place]) 09/20/2014    Family History  Problem Relation Age of Onset   Hypertension Father    Gout Father    Alzheimer's disease Father    Other Mother        Viral Meningitis   Mitral valve prolapse Mother    Arrhythmia Mother    Hypothyroidism Mother     Social History   Socioeconomic History   Marital status: Single    Spouse name: Not on file   Number of children: Not on file   Years of  education: Not on file   Highest education level: Not on file  Occupational History   Not on file  Tobacco Use   Smoking status: Never   Smokeless tobacco: Never  Substance and Sexual Activity   Alcohol use: No   Drug use: No   Sexual activity: Not on file  Other Topics Concern   Not on file  Social History Narrative   Not on file   Social Determinants of Health   Financial Resource Strain: Not on file  Food Insecurity: Not on file  Transportation Needs: Not on file  Physical Activity: Not on file  Stress: Not on file  Social Connections: Not on file  Intimate Partner Violence: Not on file    Past Medical History, Surgical history, Social history, and Family history were reviewed and updated as appropriate.   Please see review of systems for further details on the patient's review from today.   Objective:   Physical Exam:  There were no vitals taken for this visit.  Physical Exam Constitutional:      General: He is not in acute distress.    Appearance: He is well-developed.  Musculoskeletal:        General: No deformity.  Neurological:     Mental Status: He is alert and oriented to person, place, and time.     Motor: No tremor.     Coordination: Coordination normal.     Gait: Gait normal.  Psychiatric:        Attention and Perception: Attention normal. He is attentive.        Mood and Affect: Mood is not anxious or depressed. Affect is not labile, blunt, angry or inappropriate.        Behavior: Behavior normal.        Thought Content: Thought content normal. Thought content does not include homicidal or suicidal ideation. Thought content does not include homicidal or suicidal plan.        Cognition and Memory:  Cognition normal.        Judgment: Judgment normal.     Comments: Insight is good.  Some situational anxiety managed    Lab Review:     Component Value Date/Time   NA 140 02/04/2017 0525   K 4.1 02/04/2017 0525   CL 105 02/04/2017 0525   CO2 27  02/04/2017 0525   GLUCOSE 120 (H) 02/04/2017 0525   BUN 10 02/04/2017 0525   CREATININE 1.07 02/04/2017 0525   CALCIUM 9.2 02/04/2017 0525   PROT 6.9 02/01/2017 0152   ALBUMIN 3.8 02/01/2017 0152   AST 21 02/01/2017 0152   ALT 27 02/01/2017 0152   ALKPHOS 56 02/01/2017 0152   BILITOT 0.6 02/01/2017 0152   GFRNONAA >60 02/04/2017 0525   GFRAA >60 02/04/2017 0525       Component Value Date/Time   WBC 7.4 02/03/2017 0527   RBC 3.48 (L) 02/03/2017 0527   HGB 10.8 (L) 02/03/2017 0527   HCT 31.1 (L) 02/03/2017 0527   PLT 253 02/03/2017 0527   MCV 89.4 02/03/2017 0527   MCH 31.0 02/03/2017 0527   MCHC 34.7 02/03/2017 0527   RDW 13.6 02/03/2017 0527   LYMPHSABS 1.7 02/01/2017 0205   MONOABS 2.2 (H) 02/01/2017 0205   EOSABS 0.1 02/01/2017 0205   BASOSABS 0.2 (H) 02/01/2017 0205    No results found for: POCLITH, LITHIUM   Lab Results  Component Value Date   VALPROATE <10.0 (L) 09/22/2014    Sleep study dx severe OSA AHI 53.   .res Assessment: Plan:    Apolonio was seen today for follow-up and bipolar ii disorder (hcc).  Diagnoses and all orders for this visit:  Bipolar II disorder ([place]) -     pramipexole (MIRAPEX) 0.5 MG tablet; Take 1 tablet (0.5 mg total) by mouth 2 (two) times daily. -     divalproex (DEPAKOTE ER) 500 MG 24 hr tablet; Take 2 tablets (1,000 mg total) by mouth at bedtime. -     lamoTRIgine (LAMICTAL) 200 MG tablet; Take 1 tablet (200 mg total) by mouth every morning.  PTSD (post-traumatic stress disorder) -     DULoxetine (CYMBALTA) 60 MG capsule; Take 2 capsules (120 mg total) by mouth daily.  Obstructive sleep apnea Greater than 50% of 30 min face to face time with patient was spent on counseling and coordination of care. We discussed  Mood remains stable but concerns over elevated liver enzymes.  Labs not available.  Switch from lithium to VPA in 2012 was followed up with CMP which was normal at the time except for elevated ammonia level.  Which was  the reason for adding L-carnitine and this corrected the ammonia level.  Of his current meds the most likely potential cause of elevated liver enzymes would be VPA but of course there are other potential causes such as fatty liver, etc.  He wonders about switching off Depakote.  However Depakote is the primary mood stabilizer that is preventing mania.  If we stop Depakote he will have to be switched to another mood stabilizer.  The only nonantipsychotic mood stabilizer left would be carbamazepine and that causes multiple drug interaction issues.  Switching to an antipsychotic type mood stabilizer could render the off label use of the pramipexole ineffective posing a risk of relapse of depression as well as the typical potential side effects of antipsychotics.  Therefore it would be preferable not to switch off Depakote unless there is no other option.  We will gather information from his  primary care doctors including labs to look at liver enzyme levels over the last few years as well as his current Dr. Gavin Pound who found the most recent liver enzyme elevation.  We will use this information to make determination of the next step.  Supportive therapy dealing with his goal to get married.    Good response to the med combination.  Polypharmacy necessary.   Since 2017 on pramipexole has been much better with regard to mood stability.  He feels it causes weight gain but cannot maintain stability bc of it.  Disc weight loss strategies.  He's seeing dietician.  He's lost 18#.  Again Disc Ozempic as potential for weight loss and now the newer similar med.   Gave JAMA abstract at previous visit.[name]   Disc Wegovy.  Disc weight losss at length.   Disc diet and need to control blood sugar and weight loss are all needed.  Using CPAP consistently and he requires less sleep.  Disc sleep hygiene and getting adequate quantity for mental health.  Sleep deprivation increases risk mania. Disc types of sleep masks.  It  makes a big difference and doesn't leak to much.    No med changes indicated yet.  Sig risk in switchin g his primary mood stabilizer unless its absolutely nececessary.  Failed attempt to reduce pramipexole below 0.5 mg BID  Monitor BP at home as key to manage elevated Cr.  Disc how to monitor this.    30 min  FU 4 mos  Lynder Parents, MD, DFAPA   Please see After Visit Summary for patient specific instructions.  No future appointments.  No orders of the defined types were placed in this encounter.     -------------------------------

## 2021-05-10 DIAGNOSIS — G4733 Obstructive sleep apnea (adult) (pediatric): Secondary | ICD-10-CM | POA: Diagnosis not present

## 2021-05-12 DIAGNOSIS — E291 Testicular hypofunction: Secondary | ICD-10-CM | POA: Diagnosis not present

## 2021-06-01 DIAGNOSIS — R7989 Other specified abnormal findings of blood chemistry: Secondary | ICD-10-CM | POA: Diagnosis not present

## 2021-06-01 DIAGNOSIS — E291 Testicular hypofunction: Secondary | ICD-10-CM | POA: Diagnosis not present

## 2021-06-01 DIAGNOSIS — E1121 Type 2 diabetes mellitus with diabetic nephropathy: Secondary | ICD-10-CM | POA: Diagnosis not present

## 2021-06-01 DIAGNOSIS — E669 Obesity, unspecified: Secondary | ICD-10-CM | POA: Diagnosis not present

## 2021-06-01 DIAGNOSIS — I10 Essential (primary) hypertension: Secondary | ICD-10-CM | POA: Diagnosis not present

## 2021-06-02 DIAGNOSIS — Z20828 Contact with and (suspected) exposure to other viral communicable diseases: Secondary | ICD-10-CM | POA: Diagnosis not present

## 2021-06-09 ENCOUNTER — Telehealth: Payer: Self-pay | Admitting: Cardiology

## 2021-06-09 NOTE — Telephone Encounter (Signed)
Spoke with pt who is concerned because he was informed he has an enlarged heart after having a chest xray. Advised pt I see nothing in his chart regarding a heart enlargement however multiple factor can cause heart enlargement such as HTN, being over wt and abnormal heart rhythms. Pt states understanding and reports he has been working on loosing wt and is down 30 lbs.  He will continue to work on this.  He also states when he comes back to the [place] he will call to schedule f/u with Dr Anne Fu.

## 2021-06-09 NOTE — Telephone Encounter (Signed)
   Pt said he is currently in the [place] he said he have pneumonia and went to the hospital there, he had an x-ray and it shows a enlarge heart. He was advised to see a cards there but since Dr. Anne Fu his heart doctor he wanted to speak with him or his nurse first and get recommendation, he said he can be contact on his phone# [phone]

## 2021-06-23 DIAGNOSIS — H65193 Other acute nonsuppurative otitis media, bilateral: Secondary | ICD-10-CM | POA: Diagnosis not present

## 2021-06-29 DIAGNOSIS — I10 Essential (primary) hypertension: Secondary | ICD-10-CM | POA: Diagnosis not present

## 2021-06-29 DIAGNOSIS — E669 Obesity, unspecified: Secondary | ICD-10-CM | POA: Diagnosis not present

## 2021-06-29 DIAGNOSIS — E291 Testicular hypofunction: Secondary | ICD-10-CM | POA: Diagnosis not present

## 2021-06-29 DIAGNOSIS — R600 Localized edema: Secondary | ICD-10-CM | POA: Diagnosis not present

## 2021-06-30 ENCOUNTER — Other Ambulatory Visit: Payer: Self-pay | Admitting: Nephrology

## 2021-06-30 DIAGNOSIS — I129 Hypertensive chronic kidney disease with stage 1 through stage 4 chronic kidney disease, or unspecified chronic kidney disease: Secondary | ICD-10-CM | POA: Diagnosis not present

## 2021-06-30 DIAGNOSIS — N1831 Chronic kidney disease, stage 3a: Secondary | ICD-10-CM | POA: Diagnosis not present

## 2021-06-30 DIAGNOSIS — N1832 Chronic kidney disease, stage 3b: Secondary | ICD-10-CM

## 2021-06-30 DIAGNOSIS — R809 Proteinuria, unspecified: Secondary | ICD-10-CM | POA: Diagnosis not present

## 2021-06-30 DIAGNOSIS — D631 Anemia in chronic kidney disease: Secondary | ICD-10-CM | POA: Diagnosis not present

## 2021-07-04 ENCOUNTER — Other Ambulatory Visit: Payer: Self-pay | Admitting: Nephrology

## 2021-07-04 DIAGNOSIS — N1831 Chronic kidney disease, stage 3a: Secondary | ICD-10-CM

## 2021-07-05 DIAGNOSIS — D1722 Benign lipomatous neoplasm of skin and subcutaneous tissue of left arm: Secondary | ICD-10-CM | POA: Diagnosis not present

## 2021-07-05 DIAGNOSIS — G5712 Meralgia paresthetica, left lower limb: Secondary | ICD-10-CM | POA: Diagnosis not present

## 2021-07-05 DIAGNOSIS — M7702 Medial epicondylitis, left elbow: Secondary | ICD-10-CM | POA: Diagnosis not present

## 2021-07-07 ENCOUNTER — Other Ambulatory Visit: Payer: Self-pay

## 2021-07-07 ENCOUNTER — Ambulatory Visit
Admission: RE | Admit: 2021-07-07 | Discharge: 2021-07-07 | Disposition: A | Discharge status: Home / Self Care | Payer: BC Managed Care – PPO | Source: Ambulatory Visit | Attending: Nephrology | Admitting: Nephrology

## 2021-07-07 DIAGNOSIS — N281 Cyst of kidney, acquired: Secondary | ICD-10-CM | POA: Diagnosis not present

## 2021-07-07 DIAGNOSIS — N1831 Chronic kidney disease, stage 3a: Secondary | ICD-10-CM

## 2021-07-07 DIAGNOSIS — N189 Chronic kidney disease, unspecified: Secondary | ICD-10-CM | POA: Diagnosis not present

## 2021-07-07 IMAGING — US US RENAL
1 series · 14 of 25 positions shown · non-contrast
Comparison: CT, [DATE].

CLINICAL DATA: Chronic kidney disease

EXAM:
RENAL / URINARY TRACT ULTRASOUND COMPLETE

[Series 1: us renal · 0.26mm/px · 14 of 39 slices shown]
[im 1/39]
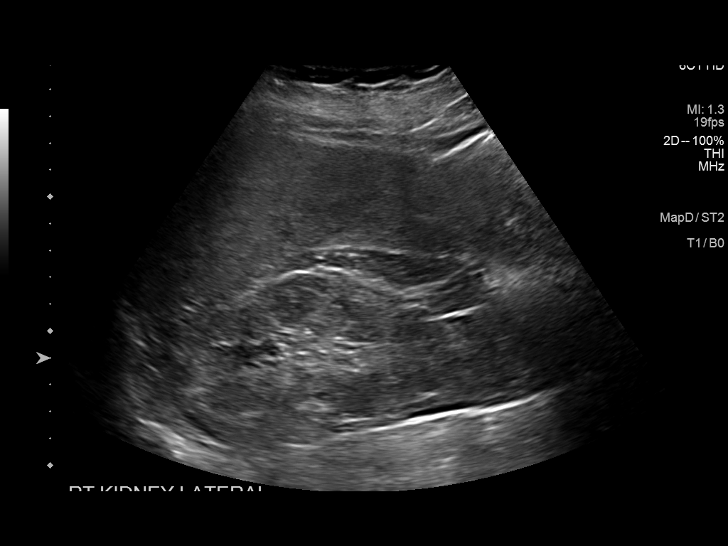
[im 4/39]
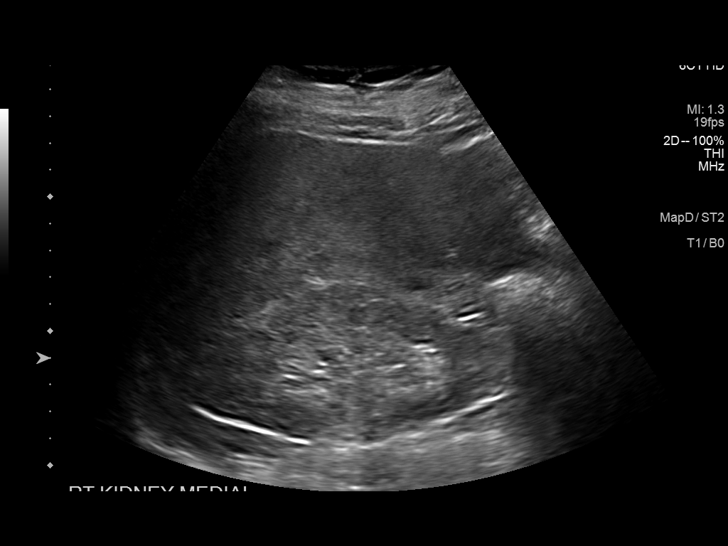
[im 7/39]
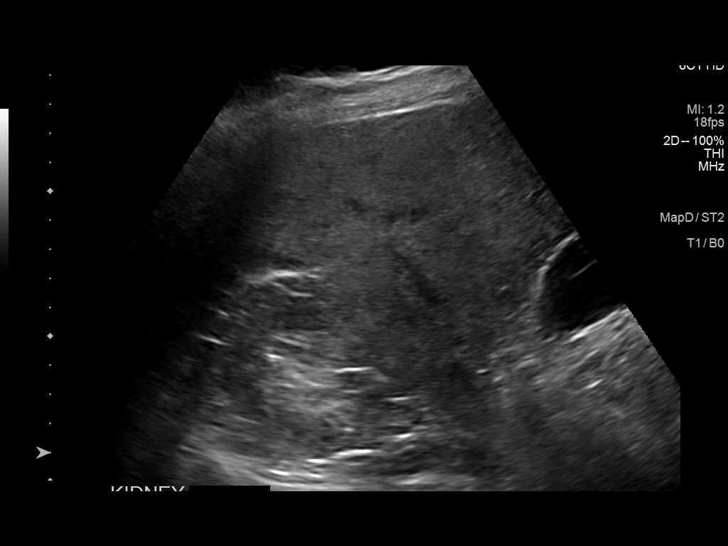
[im 10/39]
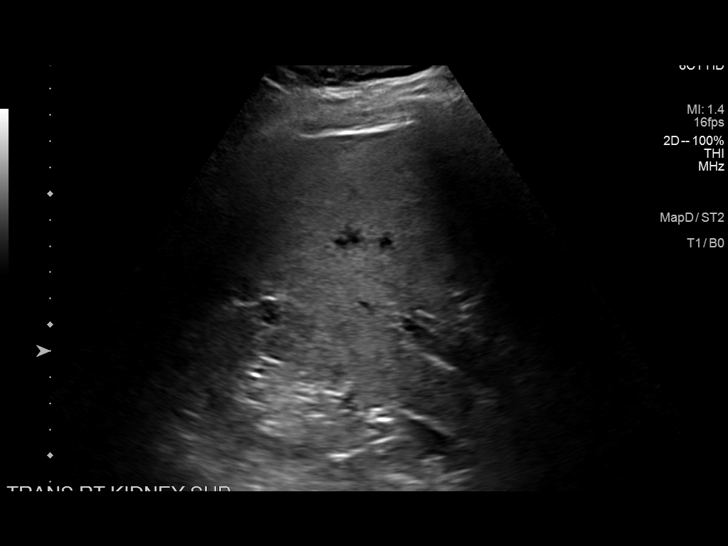
[im 13/39]
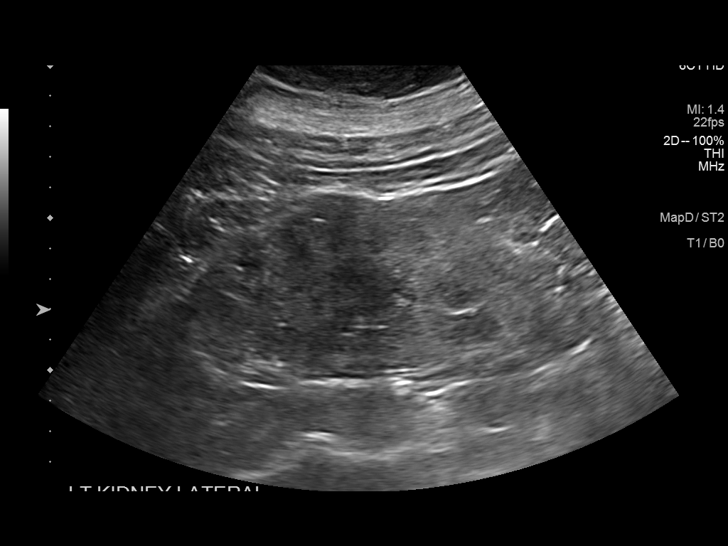
[im 15/39]
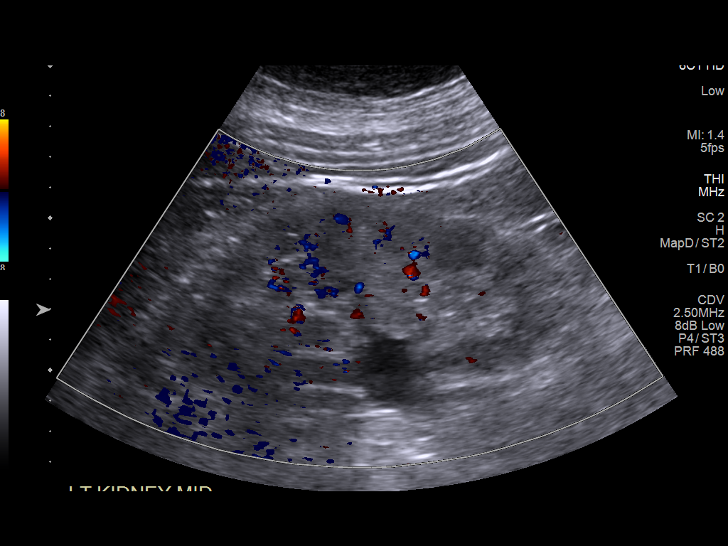
[im 18/39]
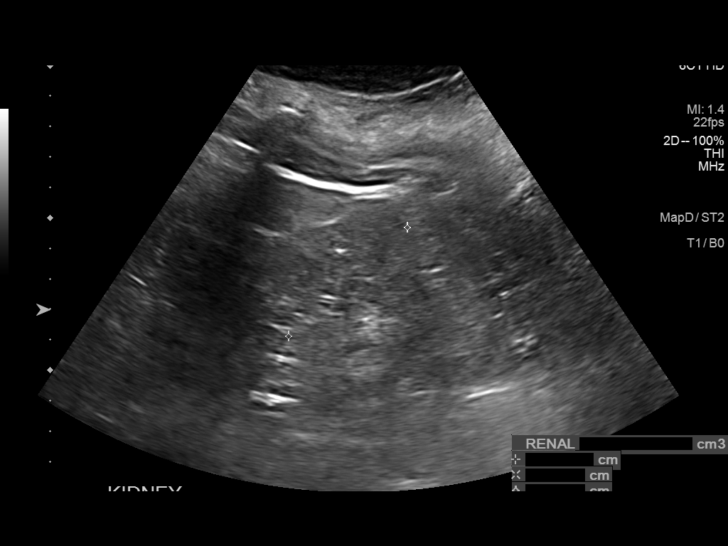
[im 21/39]
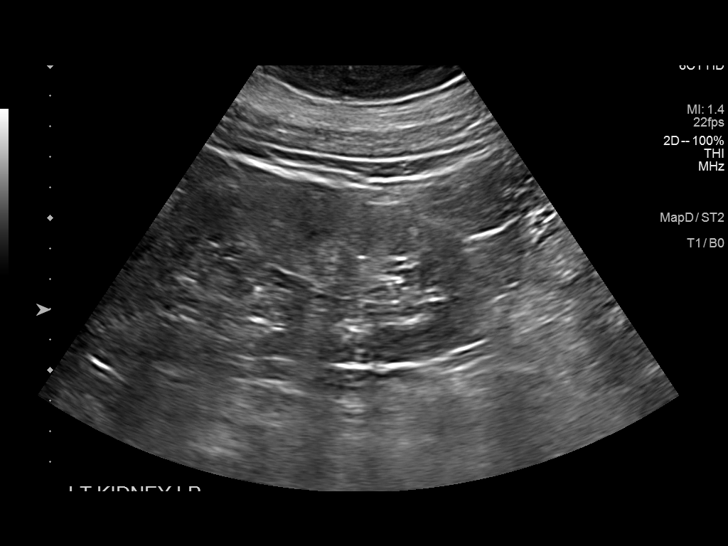
[im 24/39]
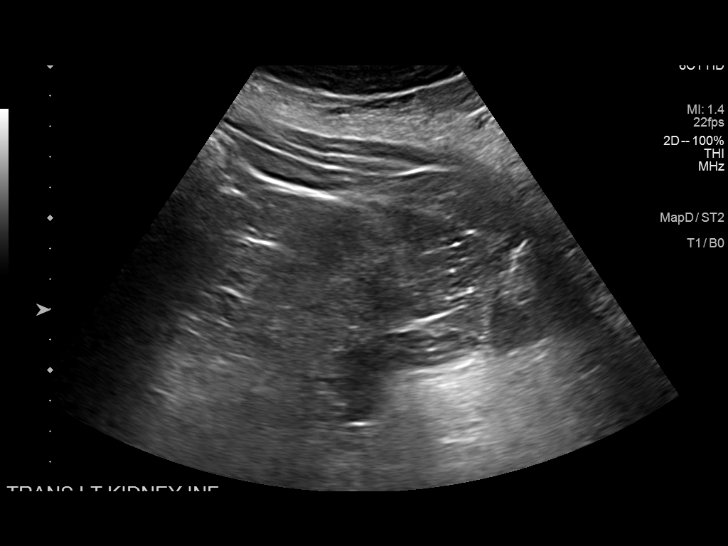
[im 26/39]
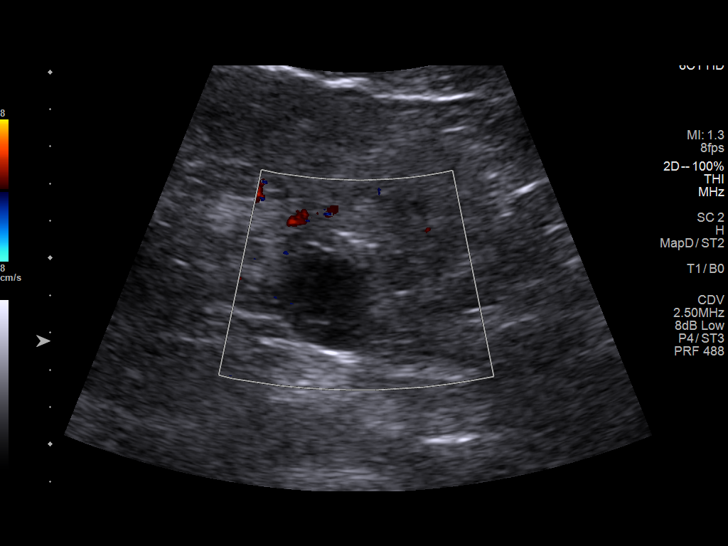
[im 29/39]
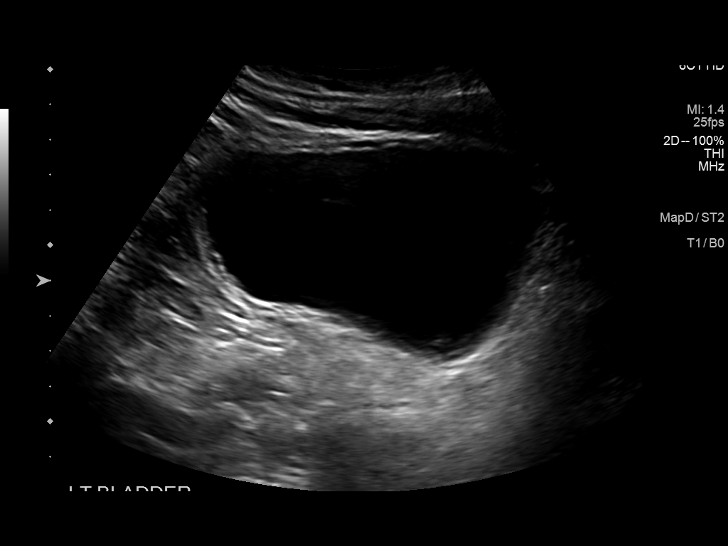
[im 32/39]
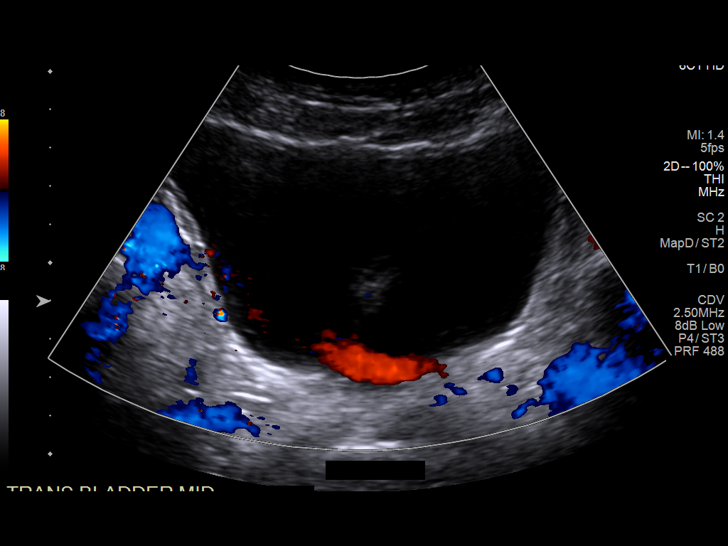
[im 35/39]
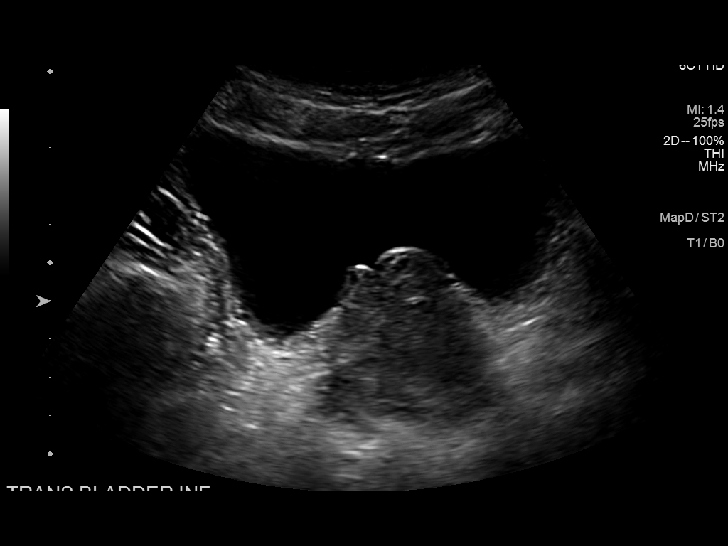
[im 39/39]
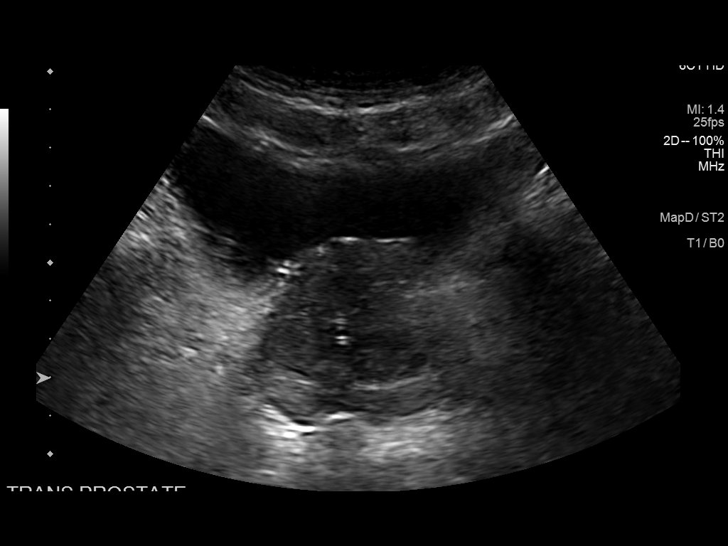

[14 of 25 positions shown; findings below may reference images not displayed]

FINDINGS: Right Kidney:

Renal measurements: 11.0 x 5.8 x 5.0 cm = volume: 167 mL. Increased
parenchymal echogenicity. No mass, stone or hydronephrosis.

Left Kidney:

Renal measurements: 12.7 x 6.3 x 5.3 cm = volume: 221 mL. Increased
renal parenchymal echogenicity. Lower pole cyst, 2.4 x 1.7 x 2.0 cm.
No other masses, no stones and no hydronephrosis.

Bladder:

Appears normal for degree of bladder distention.

Other:

None.
IMPRESSION: 1. No acute findings.  No hydronephrosis.
2. Bilateral increased renal parenchymal echogenicity consistent
with medical renal disease.
3. 2.4 cm left renal cyst.  No other masses.

## 2021-07-28 DIAGNOSIS — E291 Testicular hypofunction: Secondary | ICD-10-CM | POA: Diagnosis not present

## 2021-08-01 DIAGNOSIS — N1831 Chronic kidney disease, stage 3a: Secondary | ICD-10-CM | POA: Diagnosis not present

## 2021-08-03 DIAGNOSIS — Z713 Dietary counseling and surveillance: Secondary | ICD-10-CM | POA: Diagnosis not present

## 2021-08-11 DIAGNOSIS — E291 Testicular hypofunction: Secondary | ICD-10-CM | POA: Diagnosis not present

## 2021-08-22 DIAGNOSIS — M7702 Medial epicondylitis, left elbow: Secondary | ICD-10-CM | POA: Diagnosis not present

## 2021-08-23 DIAGNOSIS — M1009 Idiopathic gout, multiple sites: Secondary | ICD-10-CM | POA: Diagnosis not present

## 2021-08-23 DIAGNOSIS — Z79899 Other long term (current) drug therapy: Secondary | ICD-10-CM | POA: Diagnosis not present

## 2021-08-25 DIAGNOSIS — E291 Testicular hypofunction: Secondary | ICD-10-CM | POA: Diagnosis not present

## 2021-08-28 DIAGNOSIS — N1831 Chronic kidney disease, stage 3a: Secondary | ICD-10-CM | POA: Diagnosis not present

## 2021-08-30 ENCOUNTER — Ambulatory Visit (INDEPENDENT_AMBULATORY_CARE_PROVIDER_SITE_OTHER): Payer: BC Managed Care – PPO | Admitting: Psychiatry

## 2021-08-30 ENCOUNTER — Encounter: Payer: Self-pay | Admitting: Psychiatry

## 2021-08-30 ENCOUNTER — Other Ambulatory Visit: Payer: Self-pay

## 2021-08-30 DIAGNOSIS — F3181 Bipolar II disorder: Secondary | ICD-10-CM | POA: Diagnosis not present

## 2021-08-30 DIAGNOSIS — F4310 Post-traumatic stress disorder, unspecified: Secondary | ICD-10-CM

## 2021-08-30 DIAGNOSIS — G4733 Obstructive sleep apnea (adult) (pediatric): Secondary | ICD-10-CM

## 2021-08-30 DIAGNOSIS — R748 Abnormal levels of other serum enzymes: Secondary | ICD-10-CM

## 2021-08-30 MED ORDER — PRAMIPEXOLE DIHYDROCHLORIDE 0.5 MG PO TABS: 0.5000 mg | ORAL_TABLET | Freq: Two times a day (BID) | ORAL | 0 refills | Status: DC

## 2021-08-30 MED ORDER — LAMOTRIGINE 200 MG PO TABS: 200.0000 mg | ORAL_TABLET | Freq: Every morning | ORAL | 0 refills | Status: DC

## 2021-08-30 MED ORDER — DIVALPROEX SODIUM ER 500 MG PO TB24: 1000.0000 mg | ORAL_TABLET | Freq: Every day | ORAL | 0 refills | Status: DC

## 2021-08-30 MED ORDER — DULOXETINE HCL 60 MG PO CPEP: 120.0000 mg | ORAL_CAPSULE | Freq: Every day | ORAL | 0 refills | Status: DC

## 2021-08-30 NOTE — Progress Notes (Signed)
Edwin [name] [id number] 1956/03/01 65 y.o.  Subjective:   Patient ID:  Edwin [name] is a 65 y.o. (DOB [birth year]) male.  Chief Complaint:  Chief Complaint  Patient presents with   Follow-up   Bipolar II disorder ([place])   Stress    Depression        Associated symptoms include no decreased concentration and no suicidal ideas. [name] presents to the office today for follow-up of bipolar depression and anxiety.  seen July 02, 2019.  No meds were changed.  Patient was doing well.  He asked me to write a letter to his attorney and support his intention to marry a woman from [place] and that his mental health was sufficiently well that he would not represent a danger to his fiance.  This was discussed with his attorney and the letter was written.  Met girl June 8. Met her on dating site international.  [place] woman and decided wanted to get married.  Options opening up after being shut down by Covid.  seen November 2020.  No meds were changed.  He was overall doing well.  seen January 20, 2020 .  No med changed.  Following noted. Wonders about elevated liver enzymes and Depakote.  Labs not visible in Epic.  PCP Dr. Gavin Pound is working up this finding.   Doing great from mental health.  Talks daily to GF in [place].    03/23/20 the following is noted:    Taken on too many students.  Recognizes needs to have 2 days off a week and plans to schedule that.  Needs to get through end of the semester.  Fiance visa is difficult but she just got admitted to [facility] pending some exams being passed.  Visa difficult with Covid affecting travel.  She could be here in mid-July. No med changes  07/06/20 with the following noted: Still good. Pramipexole has kept depression at [place]. Mood is still stable. Good interest in basketball. Still concerned about weight and blames meds and wonders about weight loss meds and supplements.  Asked about L-glutamine taken it for a week.  Trying to  watch diet.  Hasn't weighed himself. Plan: No med changes indicated yet.  Sig risk in switching his primary mood stabilizer unless its absolutely nececessary.  10/05/20 appt noted: Consistent with meds.  Still doing great overall.  Some trouble sleeping and got help with CPAP with Dr. Maxwell Caul.  Tendency to sleep 3 hours and awakened.  May stay awake 1-2 hours and eventually get sufficient sleep.  Can awaken refreshed. No SE except weight gain.   Got denied for Ozempic.  Very concerned about weight gain with Depakote. Still facetiming with GF in [place] and no visa for her yet bc country still shut down. Plan no med changes  01/05/2021 appointment with the following noted: Expressed concerns about weight and new dx DM.   Disc retrying efforts to get Ozempic.   Has increase glucose.   Been on phone with GF in [place] for 20 mos.  It's dragging on trying to get her here.  Thinking about going there.   Previously lost the love of his life DT his illness.  Sense of loss bc her birthday was a couple of days ago.    05/03/2021 appointment with the following noted: Doing as well as anyone can be doing.  Flew to [place] in April.  People were nice.  Enjoyed the family and went with 27 people to the beach.  GF comes  from family of 59.  Going back in July and wedding July 30.  Had Face Timed for 22 mos and gotten to know the family.  She has not been to [place] yet but they plan to live here.  Spousal visas are back logged.   Has seen PCP and dietician and is losing weight. Plan: No med changes indicated yet.  Sig risk in switchin g his primary mood stabilizer unless its absolutely nececessary. Failed attempt to reduce pramipexole below 0.5 mg BID  08/30/21 appt noted: Pretty well.  Got married but she's not here yet.  Working on getting her into the [place].  Has a good immigration attorney here.  Usually takes a just a couple of mos but maybe longer  DT backlog.   Still taking meds Depakote 1000,  duloxetine 120, lamotrigine 200, pramipexole 0.5 mg BID.   Stopped soda.  Working with nutritionist and down 35#. 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   Disc psychology and masculinity goals. No SE.   Satisfied with meds and no indication to change.  Patient reports stable mood and denies depressed or irritable moods.   Patient denies any recent difficulty with anxiety.  Patient denies difficulty with sleep initiation or maintenance.  Using CPAP.[name] Sleep hygiene is better now and that' s helped.  Going to bed earlier.   Denies appetite disturbance.  Patient reports that energy and motivation have been good.  Patient denies any difficulty with concentration.  Patient denies any suicidal ideation.  No alcohol.    M died 08/05/2019.  Was a relief for her.    Past Psychiatric Medication Trials: Lithium increased Cr and switch to VPA 2012,  Depakote 1000, lamotrigine,  gabapentin, Vraylar tremor, Rexulti SE, Abilify increased weight,   Wellbutrin NR, phentermine,    Review of Systems:  Review of Systems  Constitutional:  Positive for unexpected weight change.  Cardiovascular:  Negative for palpitations.  Musculoskeletal:  Positive for arthralgias.  Neurological:  Negative for tremors and weakness.  Psychiatric/Behavioral:  Negative for agitation, behavioral problems, confusion, decreased concentration, hallucinations, self-injury, sleep disturbance and suicidal ideas. The patient is not hyperactive.  No longer depressed.   Medications: I have reviewed the patient's current medications.  Current Outpatient Medications  Medication Sig Dispense Refill   Cholecalciferol 1000 units TBDP Take 1 tablet by mouth daily.      dapagliflozin propanediol (FARXIGA) 10 MG TABS tablet Take by mouth daily.     febuxostat (ULORIC) 40 MG tablet Take 40 mg by mouth daily.     furosemide (LASIX) 20 MG tablet      hydrALAZINE (APRESOLINE) 100 MG tablet Take 100 mg by mouth 2 (two) times  daily.     LevOCARNitine (L-CARNITINE) 500 MG TABS Take 500 mg by mouth 2 (two) times daily.     losartan (COZAAR) 50 MG tablet Take 1 tablet (50 mg total) by mouth daily. 30 tablet 0   metFORMIN (GLUCOPHAGE) 500 MG tablet Take by mouth 2 (two) times daily with a meal.     multivitamin-lutein (OCUVITE-LUTEIN) CAPS capsule Take 1 capsule by mouth daily.     Turmeric 500 MG CAPS Take by mouth.     divalproex (DEPAKOTE ER) 500 MG 24 hr tablet Take 2 tablets (1,000 mg total) by mouth at bedtime. 180 tablet 0   DULoxetine (CYMBALTA) 60 MG capsule Take 2 capsules (120 mg total) by mouth daily. 180 capsule 0   lamoTRIgine (LAMICTAL) 200 MG tablet Take 1 tablet (200  mg total) by mouth every morning. 90 tablet 0   Multiple Vitamins-Minerals (CENTRUM ADULTS) TABS Take by mouth. (Patient not taking: Reported on 05/03/2021)     pramipexole (MIRAPEX) 0.5 MG tablet Take 1 tablet (0.5 mg total) by mouth 2 (two) times daily. 180 tablet 0   psyllium (METAMUCIL) 58.6 % powder Take 1 packet by mouth daily.  (Patient not taking: Reported on 05/03/2021)     zinc gluconate 50 MG tablet Take 50 mg by mouth daily. (Patient not taking: Reported on 05/03/2021)     No current facility-administered medications for this visit.    Medication Side Effects: Other: weight gain.  Allergies:  Allergies  Allergen Reactions   Allopurinol Other (See Comments)    Made pt emotionally unstable    Penicillins Hives and Other (See Comments)    Has patient had a PCN reaction causing immediate rash, facial/tongue/throat swelling, SOB or lightheadedness with hypotension: No Has patient had a PCN reaction causing severe rash involving mucus membranes or skin necrosis: No Has patient had a PCN reaction that required hospitalization No Has patient had a PCN reaction occurring within the last 10 years: No If all of the above answers are "NO", then may proceed with Cephalosporin use.   Aspirin Hives   Ciprofloxacin Rash    Past Medical  History:  Diagnosis Date   Allergic rhinitis    Bipolar disorder ([place])    Bradycardia 2012    due to lithium   CKD (chronic kidney disease) stage 2, GFR 60-89 ml/min    Gout    Hypertension    Mild renal insufficiency    , with creatinine of 1.3 in 2010, was side effect of med   Obesity    ,moderate   Prostatitis    , Episodic   Suicide attempt ([place]) 09/20/2014    Family History  Problem Relation Age of Onset   Hypertension Father    Gout Father    Alzheimer's disease Father    Other Mother        Viral Meningitis   Mitral valve prolapse Mother    Arrhythmia Mother    Hypothyroidism Mother     Social History   Socioeconomic History   Marital status: Single    Spouse name: Not on file   Number of children: Not on file   Years of education: Not on file   Highest education level: Not on file  Occupational History   Not on file  Tobacco Use   Smoking status: Never   Smokeless tobacco: Never  Substance and Sexual Activity   Alcohol use: No   Drug use: No   Sexual activity: Not on file  Other Topics Concern   Not on file  Social History Narrative   Not on file   Social Determinants of Health   Financial Resource Strain: Not on file  Food Insecurity: Not on file  Transportation Needs: Not on file  Physical Activity: Not on file  Stress: Not on file  Social Connections: Not on file  Intimate Partner Violence: Not on file    Past Medical History, Surgical history, Social history, and Family history were reviewed and updated as appropriate.   Please see review of systems for further details on the patient's review from today.   Objective:   Physical Exam:  There were no vitals taken for this visit.  Physical Exam Constitutional:      General: He is not in acute distress.    Appearance: He is well-developed.  Musculoskeletal:        General: No deformity.  Neurological:     Mental Status: He is alert and oriented to person, place, and time.      Motor: No tremor.     Coordination: Coordination normal.     Gait: Gait normal.  Psychiatric:        Attention and Perception: Attention normal. He is attentive.        Mood and Affect: Mood is not anxious or depressed. Affect is not labile, blunt, angry or inappropriate.        Speech: Speech is not rapid and pressured.        Behavior: Behavior normal.        Thought Content: Thought content normal. Thought content does not include homicidal or suicidal ideation. Thought content does not include homicidal or suicidal plan.        Cognition and Memory: Cognition normal.        Judgment: Judgment normal.     Comments: Insight is good.  Some situational anxiety managed    Lab Review:     Component Value Date/Time   NA 140 02/04/2017 0525   K 4.1 02/04/2017 0525   CL 105 02/04/2017 0525   CO2 27 02/04/2017 0525   GLUCOSE 120 (H) 02/04/2017 0525   BUN 10 02/04/2017 0525   CREATININE 1.07 02/04/2017 0525   CALCIUM 9.2 02/04/2017 0525   PROT 6.9 02/01/2017 0152   ALBUMIN 3.8 02/01/2017 0152   AST 21 02/01/2017 0152   ALT 27 02/01/2017 0152   ALKPHOS 56 02/01/2017 0152   BILITOT 0.6 02/01/2017 0152   GFRNONAA >60 02/04/2017 0525   GFRAA >60 02/04/2017 0525       Component Value Date/Time   WBC 7.4 02/03/2017 0527   RBC 3.48 (L) 02/03/2017 0527   HGB 10.8 (L) 02/03/2017 0527   HCT 31.1 (L) 02/03/2017 0527   PLT 253 02/03/2017 0527   MCV 89.4 02/03/2017 0527   MCH 31.0 02/03/2017 0527   MCHC 34.7 02/03/2017 0527   RDW 13.6 02/03/2017 0527   LYMPHSABS 1.7 02/01/2017 0205   MONOABS 2.2 (H) 02/01/2017 0205   EOSABS 0.1 02/01/2017 0205   BASOSABS 0.2 (H) 02/01/2017 0205    No results found for: POCLITH, LITHIUM   Lab Results  Component Value Date   VALPROATE <10.0 (L) 09/22/2014    Sleep study dx severe OSA AHI 53.   .res Assessment: Plan:    Anthonyjames was seen today for follow-up, bipolar ii disorder ([facility]) and stress.  Diagnoses and all orders for this  visit:  Bipolar II disorder ([place]) -     divalproex (DEPAKOTE ER) 500 MG 24 hr tablet; Take 2 tablets (1,000 mg total) by mouth at bedtime. -     lamoTRIgine (LAMICTAL) 200 MG tablet; Take 1 tablet (200 mg total) by mouth every morning. -     pramipexole (MIRAPEX) 0.5 MG tablet; Take 1 tablet (0.5 mg total) by mouth 2 (two) times daily.  PTSD (post-traumatic stress disorder) -     DULoxetine (CYMBALTA) 60 MG capsule; Take 2 capsules (120 mg total) by mouth daily.  Obstructive sleep apnea  Elevated liver enzymes Greater than 50% of 30 min face to face time with patient was spent on counseling and coordination of care. We discussed  Mood remains stable . Switch from lithium to VPA in 2012 was followed up with CMP which was normal at the time except for elevated ammonia level.  Which was the  reason for adding L-carnitine and this corrected the ammonia level.  Of his current meds the most likely potential cause of elevated liver enzymes would be VPA but of course there are other potential causes such as fatty liver, etc.  Supportive therapy dealing with his goal to get married.    Good response to the med combination.  Polypharmacy necessary.   Since 2017 on pramipexole has been much better with regard to mood stability.  He feels it causes weight gain but cannot maintain stability bc of it.  Disc questions about CKD with 45% function.  Disc weight loss strategies.  He's seeing dietician.  He's lost 18#.  Again Disc Ozempic as potential for weight loss and now the newer similar med.   Gave JAMA abstract at previous visit.[name]   Disc Wegovy.  Disc weight losss at length.   Disc diet and need to control blood sugar and weight loss are all needed.  Using CPAP consistently and he requires less sleep.  Disc sleep hygiene and getting adequate quantity for mental health.  Sleep deprivation increases risk mania. Disc types of sleep masks.  It makes a big difference and doesn't leak to much.    No med  changes indicated yet.  Sig risk in switchin g his primary mood stabilizer unless its absolutely nececessary. Depakote 1000, duloxetine 120, lamotrigine 200, pramipexole 0.5 mg BID.    Failed attempt to reduce pramipexole below 0.5 mg BID Disc risk impulsivity and mania with this but he's had depression benefit without problems.  Monitor BP at home as key to manage elevated Cr.  Disc how to monitor this.    30 min  FU 4 mos  Lynder Parents, MD, DFAPA   Please see After Visit Summary for patient specific instructions.  Future Appointments  Date Time Provider [place]  08/31/2021 10:00 AM Jerline Pain, MD [facility]    No orders of the defined types were placed in this encounter.     -------------------------------

## 2021-08-31 ENCOUNTER — Ambulatory Visit: Payer: BC Managed Care – PPO | Admitting: Cardiology

## 2021-08-31 ENCOUNTER — Encounter: Payer: Self-pay | Admitting: Cardiology

## 2021-08-31 DIAGNOSIS — I4589 Other specified conduction disorders: Secondary | ICD-10-CM | POA: Diagnosis not present

## 2021-08-31 DIAGNOSIS — I10 Essential (primary) hypertension: Secondary | ICD-10-CM

## 2021-08-31 DIAGNOSIS — N182 Chronic kidney disease, stage 2 (mild): Secondary | ICD-10-CM

## 2021-08-31 NOTE — Assessment & Plan Note (Signed)
Overall feels well without any symptoms.  No syncopal symptoms.  Once again, after stopping diltiazem and lithium condition improved.

## 2021-08-31 NOTE — Assessment & Plan Note (Signed)
Has increased to stage III recently.  Creatinine went from 1.29 up to 1.7 range.  Avoid NSAIDs.  Continue with losartan.  Renal protection.

## 2021-08-31 NOTE — Patient Instructions (Signed)
Medication Instructions:  The current medical regimen is effective;  continue present plan and medications.  *If you need a refill on your cardiac medications before your next appointment, please call your pharmacy*  Follow-Up: At [facility], you and your health needs are our priority.  As part of our continuing mission to provide you with exceptional heart care, we have created designated Provider Care Teams.  These Care Teams include your primary Cardiologist (physician) and Advanced Practice Providers (APPs -  Physician Assistants and Nurse Practitioners) who all work together to provide you with the care you need, when you need it.  We recommend signing up for the patient portal called "MyChart".  Sign up information is provided on this After Visit Summary.  MyChart is used to connect with patients for Virtual Visits (Telemedicine).  Patients are able to view lab/test results, encounter notes, upcoming appointments, etc.  Non-urgent messages can be sent to your provider as well.   To learn more about what you can do with MyChart, go to [email or web address].    Your next appointment:   1 year(s)  The format for your next appointment:   In Person  Provider:   Mark Skains, MD   Thank you for choosing [facility]!!    

## 2021-08-31 NOTE — Assessment & Plan Note (Signed)
Doing well with current medications.  No changes.  Does have a small degree of whitecoat hypertension.

## 2021-08-31 NOTE — Progress Notes (Signed)
Cardiology Office Note:    Date:  08/31/2021   ID:  Edwin [name], DOB 02-May-1956, MRN [id number]  PCP:  Marden Noble, MD  [facility] Cardiologist:  Donato Schultz, MD  [facility] Electrophysiologist:  None   Referring MD: Marden Noble, MD     History of Present Illness:    Edwin [name] is a 65 y.o. male here for the follow-up of hypertension and AV dissociation.  Hx of junctional escape rhythm with A-V dissociation possibly enhanced by lithium.  Has bipolar disorder.  Diltiazem was used for high blood pressure and was discontinued and he continued to have slow heart rates.  Cardiac catheterization in 2012 was reassuring.  Thankfully after discontinuing the lithium he felt better, felt more spontaneous on Depakote.  Dr. Jennelle Human is his psychiatrist.  He is a Engineer, agricultural.  Masters in music.  Has a chemistry major from [place].  Had Dr. Cecil Cobbs for organic chemistry.  Today: His main concern today is that he is now in stage 3 CKD.  For his diet he has been seeing a nutritionist and trying to lose weight.  Currently he is on [place].  He denies any palpitations, chest pain, or shortness of breath. No lightheadedness, headaches, syncope, orthopnea, or PND. Also has no lower extremity edema or exertional symptoms.   Past Medical History:  Diagnosis Date   Allergic rhinitis    Bipolar disorder (HCC)    Bradycardia 2012    due to lithium   CKD (chronic kidney disease) stage 2, GFR 60-89 ml/min    Gout    Hypertension    Mild renal insufficiency    , with creatinine of 1.3 in 2010, was side effect of med   Obesity    ,moderate   Prostatitis    , Episodic   Suicide attempt (HCC) 09/20/2014    Past Surgical History:  Procedure Laterality Date   APPENDECTOMY     CATARACT EXTRACTION Right    COLONOSCOPY WITH PROPOFOL N/A 06/14/2014   Procedure: COLONOSCOPY WITH PROPOFOL;  Surgeon: Charolett Bumpers, MD;  Location: [facility];  Service: Endoscopy;  Laterality:  N/A;   TONSILLECTOMY     UMBILICAL HERNIA REPAIR      Current Medications: Current Meds  Medication Sig   Cholecalciferol 1000 units TBDP Take 1 tablet by mouth daily.    dapagliflozin propanediol (FARXIGA) 10 MG TABS tablet Take by mouth daily.   divalproex (DEPAKOTE ER) 500 MG 24 hr tablet Take 2 tablets (1,000 mg total) by mouth at bedtime.   DULoxetine (CYMBALTA) 60 MG capsule Take 2 capsules (120 mg total) by mouth daily.   febuxostat (ULORIC) 40 MG tablet Take 40 mg by mouth daily.   furosemide (LASIX) 20 MG tablet    hydrALAZINE (APRESOLINE) 100 MG tablet Take 100 mg by mouth 2 (two) times daily.   lamoTRIgine (LAMICTAL) 200 MG tablet Take 1 tablet (200 mg total) by mouth every morning.   LevOCARNitine (L-CARNITINE) 500 MG TABS Take 500 mg by mouth 2 (two) times daily.   losartan (COZAAR) 50 MG tablet Take 1 tablet (50 mg total) by mouth daily.   metFORMIN (GLUCOPHAGE) 500 MG tablet Take by mouth 2 (two) times daily with a meal.   multivitamin-lutein (OCUVITE-LUTEIN) CAPS capsule Take 1 capsule by mouth daily.   pramipexole (MIRAPEX) 0.5 MG tablet Take 1 tablet (0.5 mg total) by mouth 2 (two) times daily.   Turmeric 500 MG CAPS Take by mouth.     Allergies:  Allopurinol, Penicillins, Aspirin, and Ciprofloxacin   Social History   Socioeconomic History   Marital status: Single    Spouse name: Not on file   Number of children: Not on file   Years of education: Not on file   Highest education level: Not on file  Occupational History   Not on file  Tobacco Use   Smoking status: Never   Smokeless tobacco: Never  Substance and Sexual Activity   Alcohol use: No   Drug use: No   Sexual activity: Not on file  Other Topics Concern   Not on file  Social History Narrative   Not on file   Social Determinants of Health   Financial Resource Strain: Not on file  Food Insecurity: Not on file  Transportation Needs: Not on file  Physical Activity: Not on file  Stress: Not on  file  Social Connections: Not on file     Family History: The patient's family history includes Alzheimer's disease in his father; Arrhythmia in his mother; Gout in his father; Hypertension in his father; Hypothyroidism in his mother; Mitral valve prolapse in his mother; Other in his mother.  ROS:   Please see the history of present illness.    All other systems reviewed and are negative.  EKGs/Labs/Other Studies Reviewed:    LE Venous Duplex 09/11/2017: COMPARISON:  Bilateral lower extremity venous duplex examination on 09/03/2017.   FINDINGS: Right lower extremity: The right saphenofemoral junction is patent. Normal caliber of the right great saphenous vein without reflux. No varicosities. Small right short saphenous vein without reflux.   Left lower extremity: The left saphenofemoral junction is patent. Normal caliber of the left great saphenous vein without reflux. Small left short saphenous vein without reflux. No varicosities.   Subcutaneous edema in the lower calves bilaterally.   IMPRESSION: Negative superficial venous examination. No evidence for superficial venous insufficiency or reflux within the bilateral lower extremity saphenous veins.  EKG:  EKG is personally reviewed and interpreted. 08/31/2021: Sinus rhythm. Rate 60 bpm. IVCD. 06/22/2020: sinus rhythm 68 incomplete right bundle branch block no other abnormalities  06/08/2019-normal sinus rhythm 62 with nonspecific ST-T wave changes  Recent Labs: No results [name] for requested labs within last 8760 hours.  Recent Lipid Panel No results [name] for: CHOL, TRIG, HDL, CHOLHDL, VLDL, LDLCALC, LDLDIRECT  Physical Exam:    VS:  BP 132/80 (BP Location: Left Arm, Patient Position: Sitting, Cuff Size: Normal)   Pulse 60   Ht 5\' 11"  (1.803 m)   Wt 236 lb (107 kg)   SpO2 97%   BMI 32.92 kg/m     Wt Readings from Last 3 Encounters:  08/31/21 236 lb (107 kg)  06/22/20 275 lb (124.7 kg)  06/08/19 262 lb 9.6 oz  (119.1 kg)     GEN:  Well nourished, well developed in no acute distress HEENT: Normal NECK: No JVD; No carotid bruits LYMPHATICS: No lymphadenopathy CARDIAC: RRR, no murmurs, rubs, gallops RESPIRATORY:  Clear to auscultation without rales, wheezing or rhonchi  ABDOMEN: Soft, non-tender, non-distended MUSCULOSKELETAL:  No edema; No deformity  SKIN: Warm and dry NEUROLOGIC:  Alert and oriented x 3 PSYCHIATRIC:  Normal affect   ASSESSMENT:    1. AV dissociation   2. Essential hypertension   3. CKD (chronic kidney disease), stage II     PLAN:    In order of problems listed above: AV dissociation Overall feels well without any symptoms.  No syncopal symptoms.  Once again, after stopping diltiazem and lithium  condition improved.    Essential hypertension Doing well with current medications.  No changes.  Does have a small degree of whitecoat hypertension.  CKD (chronic kidney disease), stage II Has increased to stage III recently.  Creatinine went from 1.29 up to 1.7 range.  Avoid NSAIDs.  Continue with losartan.  Renal protection.  One-year follow-up, he would like to continue to maintain visits given his prior electrical abnormality.  Follow-up:   12 months  Medication Adjustments/Labs and Tests Ordered: Current medicines are reviewed at length with the patient today.  Concerns regarding medicines are outlined above.  Orders Placed This Encounter  Procedures   EKG 12-Lead    No orders of the defined types were placed in this encounter.   Patient Instructions  Medication Instructions:  The current medical regimen is effective;  continue present plan and medications.  *If you need a refill on your cardiac medications before your next appointment, please call your pharmacy*  Follow-Up: At [facility], you and your health needs are our priority.  As part of our continuing mission to provide you with exceptional heart care, we have created designated Provider Care  Teams.  These Care Teams include your primary Cardiologist (physician) and Advanced Practice Providers (APPs -  Physician Assistants and Nurse Practitioners) who all work together to provide you with the care you need, when you need it.  We recommend signing up for the patient portal called "MyChart".  Sign up information is provided on this After Visit Summary.  MyChart is used to connect with patients for Virtual Visits (Telemedicine).  Patients are able to view lab/test results, encounter notes, upcoming appointments, etc.  Non-urgent messages can be sent to your provider as well.   To learn more about what you can do with MyChart, go to [email or web address].    Your next appointment:   1 year(s)  The format for your next appointment:   In Person  Provider:   Donato Schultz, MD   Thank you for choosing [facility]!!     I,Mathew Stumpf,acting as a scribe for Donato Schultz, MD.,have documented all relevant documentation on the behalf of Donato Schultz, MD,as directed by  Donato Schultz, MD while in the presence of Donato Schultz, MD.  I, Donato Schultz, MD, have reviewed all documentation for this visit. The documentation on 08/31/21 for the exam, diagnosis, procedures, and orders are all accurate and complete.   Signed, Donato Schultz, MD  08/31/2021 10:56 AM    [facility]

## 2021-09-07 DIAGNOSIS — Z713 Dietary counseling and surveillance: Secondary | ICD-10-CM | POA: Diagnosis not present

## 2021-09-08 DIAGNOSIS — E291 Testicular hypofunction: Secondary | ICD-10-CM | POA: Diagnosis not present

## 2021-09-22 DIAGNOSIS — E291 Testicular hypofunction: Secondary | ICD-10-CM | POA: Diagnosis not present

## 2021-09-25 DIAGNOSIS — Z03818 Encounter for observation for suspected exposure to other biological agents ruled out: Secondary | ICD-10-CM | POA: Diagnosis not present

## 2021-10-03 DIAGNOSIS — R739 Hyperglycemia, unspecified: Secondary | ICD-10-CM | POA: Diagnosis not present

## 2021-10-03 DIAGNOSIS — E785 Hyperlipidemia, unspecified: Secondary | ICD-10-CM | POA: Diagnosis not present

## 2021-10-03 DIAGNOSIS — Z23 Encounter for immunization: Secondary | ICD-10-CM | POA: Diagnosis not present

## 2021-10-03 DIAGNOSIS — M7702 Medial epicondylitis, left elbow: Secondary | ICD-10-CM | POA: Diagnosis not present

## 2021-10-03 DIAGNOSIS — N183 Chronic kidney disease, stage 3 unspecified: Secondary | ICD-10-CM | POA: Diagnosis not present

## 2021-10-06 DIAGNOSIS — E291 Testicular hypofunction: Secondary | ICD-10-CM | POA: Diagnosis not present

## 2021-10-16 DIAGNOSIS — Z713 Dietary counseling and surveillance: Secondary | ICD-10-CM | POA: Diagnosis not present

## 2021-10-20 DIAGNOSIS — E291 Testicular hypofunction: Secondary | ICD-10-CM | POA: Diagnosis not present

## 2021-10-31 DIAGNOSIS — L821 Other seborrheic keratosis: Secondary | ICD-10-CM | POA: Diagnosis not present

## 2021-10-31 DIAGNOSIS — L814 Other melanin hyperpigmentation: Secondary | ICD-10-CM | POA: Diagnosis not present

## 2021-10-31 DIAGNOSIS — D485 Neoplasm of uncertain behavior of skin: Secondary | ICD-10-CM | POA: Diagnosis not present

## 2021-10-31 DIAGNOSIS — D225 Melanocytic nevi of trunk: Secondary | ICD-10-CM | POA: Diagnosis not present

## 2021-10-31 DIAGNOSIS — L738 Other specified follicular disorders: Secondary | ICD-10-CM | POA: Diagnosis not present

## 2021-11-03 DIAGNOSIS — E291 Testicular hypofunction: Secondary | ICD-10-CM | POA: Diagnosis not present

## 2021-11-07 DIAGNOSIS — H524 Presbyopia: Secondary | ICD-10-CM | POA: Diagnosis not present

## 2021-11-07 DIAGNOSIS — Z961 Presence of intraocular lens: Secondary | ICD-10-CM | POA: Diagnosis not present

## 2021-11-17 DIAGNOSIS — E291 Testicular hypofunction: Secondary | ICD-10-CM | POA: Diagnosis not present

## 2021-12-01 DIAGNOSIS — E291 Testicular hypofunction: Secondary | ICD-10-CM | POA: Diagnosis not present

## 2021-12-11 ENCOUNTER — Other Ambulatory Visit: Payer: Self-pay | Admitting: Psychiatry

## 2021-12-11 DIAGNOSIS — F3181 Bipolar II disorder: Secondary | ICD-10-CM

## 2021-12-11 DIAGNOSIS — Z713 Dietary counseling and surveillance: Secondary | ICD-10-CM | POA: Diagnosis not present

## 2021-12-15 DIAGNOSIS — E291 Testicular hypofunction: Secondary | ICD-10-CM | POA: Diagnosis not present

## 2021-12-20 DIAGNOSIS — N1831 Chronic kidney disease, stage 3a: Secondary | ICD-10-CM | POA: Diagnosis not present

## 2021-12-29 DIAGNOSIS — N1831 Chronic kidney disease, stage 3a: Secondary | ICD-10-CM | POA: Diagnosis not present

## 2021-12-29 DIAGNOSIS — E291 Testicular hypofunction: Secondary | ICD-10-CM | POA: Diagnosis not present

## 2021-12-29 DIAGNOSIS — D631 Anemia in chronic kidney disease: Secondary | ICD-10-CM | POA: Diagnosis not present

## 2021-12-29 DIAGNOSIS — R809 Proteinuria, unspecified: Secondary | ICD-10-CM | POA: Diagnosis not present

## 2021-12-29 DIAGNOSIS — I129 Hypertensive chronic kidney disease with stage 1 through stage 4 chronic kidney disease, or unspecified chronic kidney disease: Secondary | ICD-10-CM | POA: Diagnosis not present

## 2022-01-01 ENCOUNTER — Encounter: Payer: Self-pay | Admitting: Psychiatry

## 2022-01-01 ENCOUNTER — Ambulatory Visit (INDEPENDENT_AMBULATORY_CARE_PROVIDER_SITE_OTHER): Payer: BC Managed Care – PPO | Admitting: Psychiatry

## 2022-01-01 ENCOUNTER — Other Ambulatory Visit: Payer: Self-pay

## 2022-01-01 DIAGNOSIS — F4310 Post-traumatic stress disorder, unspecified: Secondary | ICD-10-CM | POA: Diagnosis not present

## 2022-01-01 DIAGNOSIS — R748 Abnormal levels of other serum enzymes: Secondary | ICD-10-CM

## 2022-01-01 DIAGNOSIS — F3181 Bipolar II disorder: Secondary | ICD-10-CM | POA: Diagnosis not present

## 2022-01-01 DIAGNOSIS — G4733 Obstructive sleep apnea (adult) (pediatric): Secondary | ICD-10-CM | POA: Diagnosis not present

## 2022-01-01 MED ORDER — PRAMIPEXOLE DIHYDROCHLORIDE 0.5 MG PO TABS: 0.5000 mg | ORAL_TABLET | Freq: Two times a day (BID) | ORAL | 1 refills | Status: DC

## 2022-01-01 MED ORDER — LAMOTRIGINE 200 MG PO TABS: 200.0000 mg | ORAL_TABLET | Freq: Every morning | ORAL | 1 refills | Status: DC

## 2022-01-01 MED ORDER — DIVALPROEX SODIUM ER 500 MG PO TB24: 1000.0000 mg | ORAL_TABLET | Freq: Every day | ORAL | 1 refills | Status: DC

## 2022-01-01 MED ORDER — DULOXETINE HCL 60 MG PO CPEP: 120.0000 mg | ORAL_CAPSULE | Freq: Every day | ORAL | 1 refills | Status: DC

## 2022-01-01 NOTE — Progress Notes (Signed)
Edwin [name] [id number] [birth year] 66 y.o.  Subjective:   Patient ID:  Edwin [name] is a 66 y.o. (DOB Sep 11, 1956) male.  Chief Complaint:  Chief Complaint  Patient presents with   Follow-up    Bipolar II disorder and anxiety    Depression        Associated symptoms include no decreased concentration and no suicidal ideas. Edwin [name] presents to the office today for follow-up of bipolar depression and anxiety.  seen July 02, 2019.  No meds were changed.  Patient was doing well.  He asked me to write a letter to his attorney and support his intention to marry a woman from [place] and that his mental health was sufficiently well that he would not represent a danger to his fiance.  This was discussed with his attorney and the letter was written.  Met girl June 8. Met her on dating site international.  [place] woman and decided wanted to get married.  Options opening up after being shut down by Covid.  seen November 2020.  No meds were changed.  He was overall doing well.  seen January 20, 2020 .  No med changed.  Following noted. Wonders about elevated liver enzymes and Depakote.  Labs not visible in Epic.  PCP Dr. Gavin [name] is working up this finding.   Doing great from mental health.  Talks daily to GF in [place].    03/23/20 the following is noted:    Taken on too many students.  Recognizes needs to have 2 days off a week and plans to schedule that.  Needs to get through end of the semester.  Fiance visa is difficult but she just got admitted to [facility] pending some exams being passed.  Visa difficult with Covid affecting travel.  She could be here in mid-July. No med changes  07/06/20 with the following noted: Still good. Pramipexole has kept depression at [place]. Mood is still stable. Good interest in basketball. Still concerned about weight and blames meds and wonders about weight loss meds and supplements.  Asked about L-glutamine taken it for a week.  Trying to watch  diet.  Hasn't weighed himself. Plan: No med changes indicated yet.  Sig risk in switching his primary mood stabilizer unless its absolutely nececessary.  10/05/20 appt noted: Consistent with meds.  Still doing great overall.  Some trouble sleeping and got help with CPAP with Dr. Maxwell [name].  Tendency to sleep 3 hours and awakened.  May stay awake 1-2 hours and eventually get sufficient sleep.  Can awaken refreshed. No SE except weight gain.   Got denied for Ozempic.  Very concerned about weight gain with Depakote. Still facetiming with GF in [place] and no visa for her yet bc country still shut down. Plan no med changes  01/05/2021 appointment with the following noted: Expressed concerns about weight and new dx DM.   Disc retrying efforts to get Ozempic.   Has increase glucose.   Been on phone with GF in [place] for 20 mos.  It's dragging on trying to get her here.  Thinking about going there.   Previously lost the love of his life Edwin [name] his illness.  Sense of loss bc her birthday was a couple of days ago.    05/03/2021 appointment with the following noted: Doing as well as anyone can be doing.  Flew to [place] in April.  People were nice.  Enjoyed the family and went with 27 people to the beach.  GF comes from  family of 66.  Going back in July and wedding July 30.  Had Face Timed for 22 mos and gotten to know the family.  She has not been to [place] yet but they plan to live here.  Spousal visas are back logged.   Has seen PCP and dietician and is losing weight. Plan: No med changes indicated yet.  Sig risk in switchin g his primary mood stabilizer unless its absolutely nececessary. Failed attempt to reduce pramipexole below 0.5 mg BID  08/30/21 appt noted: Pretty well.  Got married but she's not here yet.  Working on getting her into the [place].  Has a good immigration attorney here.  Usually takes a just a couple of mos but maybe longer  Edwin [name] backlog.   Still taking meds Depakote 1000, duloxetine  120, lamotrigine 200, pramipexole 0.5 mg BID.   Stopped soda.  Working with nutritionist and down 35#. 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   Disc psychology and masculinity goals. No SE.   Satisfied with meds and no indication to change. Plan: continue Depakote 1000, duloxetine 120, lamotrigine 200, pramipexole 0.5 mg BID.   Failed attempt to reduce pramipexole below 0.5 mg BID  01/01/22 appt noted: Going to [employer].   Very good mental health.   New wife is delayed getting here bc slow immigration.  She's in [place] right now.   Got married in [place].   OK with meds. CKD stable.  Patient reports stable mood and denies depressed or irritable moods.   Patient denies any recent difficulty with anxiety.  Patient denies difficulty with sleep initiation or maintenance.  Using CPAP.[name] Sleep hygiene is better now and that' s helped.  Going to bed earlier.   Denies appetite disturbance.  Patient reports that energy and motivation have been good.  Patient denies any difficulty with concentration.  Patient denies any suicidal ideation.  No alcohol.    M died Jul 09, 2019.  Was a relief for her.    Past Psychiatric Medication Trials: Lithium increased Cr and switch to VPA 2012,  Depakote 1000, lamotrigine,  gabapentin, Vraylar tremor, Rexulti SE, Abilify increased weight,   Wellbutrin NR, phentermine,    Psych hosp 1986 helpful   Review of Systems:  Review of Systems  Constitutional:  Negative for unexpected weight change.  Cardiovascular:  Negative for palpitations.  Musculoskeletal:  Positive for arthralgias.  Neurological:  Negative for tremors and weakness.  Psychiatric/Behavioral:  Negative for agitation, behavioral problems, confusion, decreased concentration, hallucinations, self-injury, sleep disturbance and suicidal ideas. The patient is not hyperactive.  No longer depressed.   Medications: I have reviewed the patient's current  medications.  Current Outpatient Medications  Medication Sig Dispense Refill   Cholecalciferol 1000 units TBDP Take 1 tablet by mouth daily.      dapagliflozin propanediol (FARXIGA) 10 MG TABS tablet Take by mouth daily.     febuxostat (ULORIC) 40 MG tablet Take 40 mg by mouth daily.     Finerenone (KERENDIA) 10 MG TABS Take by mouth.     furosemide (LASIX) 20 MG tablet      hydrALAZINE (APRESOLINE) 100 MG tablet Take 100 mg by mouth 2 (two) times daily.     LevOCARNitine (L-CARNITINE) 500 MG TABS Take 500 mg by mouth 2 (two) times daily.     losartan (COZAAR) 50 MG tablet Take 1 tablet (50 mg total) by mouth daily. 30 tablet 0   metFORMIN (GLUCOPHAGE) 500 MG tablet Take by mouth 2 (two)  times daily with a meal.     multivitamin-lutein (OCUVITE-LUTEIN) CAPS capsule Take 1 capsule by mouth daily.     Turmeric 500 MG CAPS Take by mouth.     divalproex (DEPAKOTE ER) 500 MG 24 hr tablet Take 2 tablets (1,000 mg total) by mouth at bedtime. 180 tablet 1   DULoxetine (CYMBALTA) 60 MG capsule Take 2 capsules (120 mg total) by mouth daily. 180 capsule 1   lamoTRIgine (LAMICTAL) 200 MG tablet Take 1 tablet (200 mg total) by mouth every morning. 90 tablet 1   pramipexole (MIRAPEX) 0.5 MG tablet Take 1 tablet (0.5 mg total) by mouth 2 (two) times daily. 180 tablet 1   No current facility-administered medications for this visit.    Medication Side Effects: Other: weight gain.  Allergies:  Allergies  Allergen Reactions   Allopurinol Other (See Comments)    Made pt emotionally unstable    Penicillins Hives and Other (See Comments)    Has patient had a PCN reaction causing immediate rash, facial/tongue/throat swelling, SOB or lightheadedness with hypotension: No Has patient had a PCN reaction causing severe rash involving mucus membranes or skin necrosis: No Has patient had a PCN reaction that required hospitalization No Has patient had a PCN reaction occurring within the last 10 years: No If all  of the above answers are "NO", then may proceed with Cephalosporin use.   Aspirin Hives   Ciprofloxacin Rash    Past Medical History:  Diagnosis Date   Allergic rhinitis    Bipolar disorder ([place])    Bradycardia 2012    due to lithium   CKD (chronic kidney disease) stage 2, GFR 60-89 ml/min    Gout    Hypertension    Mild renal insufficiency    , with creatinine of 1.3 in 2010, was side effect of med   Obesity    ,moderate   Prostatitis    , Episodic   Suicide attempt ([place]) 09/20/2014    Family History  Problem Relation Age of Onset   Hypertension Father    Gout Father    Alzheimer's disease Father    Other Mother        Viral Meningitis   Mitral valve prolapse Mother    Arrhythmia Mother    Hypothyroidism Mother     Social History   Socioeconomic History   Marital status: Single    Spouse name: Not on file   Number of children: Not on file   Years of education: Not on file   Highest education level: Not on file  Occupational History   Not on file  Tobacco Use   Smoking status: Never   Smokeless tobacco: Never  Substance and Sexual Activity   Alcohol use: No   Drug use: No   Sexual activity: Not on file  Other Topics Concern   Not on file  Social History Narrative   Not on file   Social Determinants of Health   Financial Resource Strain: Not on file  Food Insecurity: Not on file  Transportation Needs: Not on file  Physical Activity: Not on file  Stress: Not on file  Social Connections: Not on file  Intimate Partner Violence: Not on file    Past Medical History, Surgical history, Social history, and Family history were reviewed and updated as appropriate.   Please see review of systems for further details on the patient's review from today.   Objective:   Physical Exam:  There were no vitals taken for this  visit.  Physical Exam Constitutional:      General: He is not in acute distress.    Appearance: He is well-developed.  Musculoskeletal:         General: No deformity.  Neurological:     Mental Status: He is alert and oriented to person, place, and time.     Motor: No tremor.     Coordination: Coordination normal.     Gait: Gait normal.  Psychiatric:        Attention and Perception: Attention normal. He is attentive.        Mood and Affect: Mood is not anxious or depressed. Affect is not labile, blunt, angry or inappropriate.        Speech: Speech is not rapid and pressured.        Behavior: Behavior normal.        Thought Content: Thought content normal. Thought content is not delusional. Thought content does not include homicidal or suicidal ideation. Thought content does not include suicidal plan.        Cognition and Memory: Cognition normal.        Judgment: Judgment normal.     Comments: Insight is good.  Some situational anxiety managed    Lab Review:     Component Value Date/Time   NA 140 02/04/2017 0525   K 4.1 02/04/2017 0525   CL 105 02/04/2017 0525   CO2 27 02/04/2017 0525   GLUCOSE 120 (H) 02/04/2017 0525   BUN 10 02/04/2017 0525   CREATININE 1.07 02/04/2017 0525   CALCIUM 9.2 02/04/2017 0525   PROT 6.9 02/01/2017 0152   ALBUMIN 3.8 02/01/2017 0152   AST 21 02/01/2017 0152   ALT 27 02/01/2017 0152   ALKPHOS 56 02/01/2017 0152   BILITOT 0.6 02/01/2017 0152   GFRNONAA >60 02/04/2017 0525   GFRAA >60 02/04/2017 0525       Component Value Date/Time   WBC 7.4 02/03/2017 0527   RBC 3.48 (L) 02/03/2017 0527   HGB 10.8 (L) 02/03/2017 0527   HCT 31.1 (L) 02/03/2017 0527   PLT 253 02/03/2017 0527   MCV 89.4 02/03/2017 0527   MCH 31.0 02/03/2017 0527   MCHC 34.7 02/03/2017 0527   RDW 13.6 02/03/2017 0527   LYMPHSABS 1.7 02/01/2017 0205   MONOABS 2.2 (H) 02/01/2017 0205   EOSABS 0.1 02/01/2017 0205   BASOSABS 0.2 (H) 02/01/2017 0205    No results found for: POCLITH, LITHIUM   Lab Results  Component Value Date   VALPROATE <10.0 (L) 09/22/2014    Sleep study dx severe OSA AHI 53.    .res Assessment: Plan:    Delvis was seen today for follow-up.  Diagnoses and all orders for this visit:  Bipolar II disorder ([place]) -     divalproex (DEPAKOTE ER) 500 MG 24 hr tablet; Take 2 tablets (1,000 mg total) by mouth at bedtime. -     lamoTRIgine (LAMICTAL) 200 MG tablet; Take 1 tablet (200 mg total) by mouth every morning. -     pramipexole (MIRAPEX) 0.5 MG tablet; Take 1 tablet (0.5 mg total) by mouth 2 (two) times daily.  PTSD (post-traumatic stress disorder) -     DULoxetine (CYMBALTA) 60 MG capsule; Take 2 capsules (120 mg total) by mouth daily.  Obstructive sleep apnea  Elevated liver enzymes  Greater than 50% of 30 min face to face time with patient was spent on counseling and coordination of care. We discussed  Mood remains stable . Switch from lithium to  VPA in 2012 was followed up with CMP which was normal at the time except for elevated ammonia level.  Which was the reason for adding L-carnitine and this corrected the ammonia level.  Of his current meds the most likely potential cause of elevated liver enzymes would be VPA but of course there are other potential causes such as fatty liver, etc.  Supportive therapy dealing with his goal to get married.    Good response to the med combination.  Polypharmacy necessary.   Since 2017 on pramipexole has been much better with regard to mood stability.  He feels it causes weight gain but cannot maintain stability bc of it.  Disc weight loss strategies.  He's seeing dietician.  He's lost 18#.  Again Disc Ozempic as potential for weight loss and now the newer similar med.   Gave JAMA abstract at previous visit.[name]   Disc Wegovy.  Disc weight losss at length.   Disc diet and need to control blood sugar and weight loss are all needed.  Using CPAP consistently and he requires less sleep.  Disc sleep hygiene and getting adequate quantity for mental health.  Sleep deprivation increases risk mania. Disc types of sleep masks.  It  makes a big difference and doesn't leak to much.    No med changes indicated yet.  Sig risk in switching his primary mood stabilizer unless its absolutely nececessary. Depakote 1000, duloxetine 120, lamotrigine 200, pramipexole 0.5 mg BID.    Failed attempt to reduce pramipexole below 0.5 mg BID Disc risk impulsivity and mania with this but he's had depression benefit without problems.  30 min  FU 4 mos  Lynder Parents, MD, DFAPA   Please see After Visit Summary for patient specific instructions.  No future appointments.   No orders of the defined types were placed in this encounter.      -------------------------------

## 2022-01-02 DIAGNOSIS — J1281 Pneumonia due to SARS-associated coronavirus: Secondary | ICD-10-CM | POA: Diagnosis not present

## 2022-01-05 DIAGNOSIS — Z125 Encounter for screening for malignant neoplasm of prostate: Secondary | ICD-10-CM | POA: Diagnosis not present

## 2022-01-05 DIAGNOSIS — E291 Testicular hypofunction: Secondary | ICD-10-CM | POA: Diagnosis not present

## 2022-01-26 DIAGNOSIS — N1831 Chronic kidney disease, stage 3a: Secondary | ICD-10-CM | POA: Diagnosis not present

## 2022-01-26 DIAGNOSIS — E1122 Type 2 diabetes mellitus with diabetic chronic kidney disease: Secondary | ICD-10-CM | POA: Diagnosis not present

## 2022-01-31 DIAGNOSIS — N401 Enlarged prostate with lower urinary tract symptoms: Secondary | ICD-10-CM | POA: Diagnosis not present

## 2022-01-31 DIAGNOSIS — R972 Elevated prostate specific antigen [PSA]: Secondary | ICD-10-CM | POA: Diagnosis not present

## 2022-01-31 DIAGNOSIS — R350 Frequency of micturition: Secondary | ICD-10-CM | POA: Diagnosis not present

## 2022-01-31 DIAGNOSIS — N5201 Erectile dysfunction due to arterial insufficiency: Secondary | ICD-10-CM | POA: Diagnosis not present

## 2022-02-02 ENCOUNTER — Other Ambulatory Visit: Payer: Self-pay | Admitting: Internal Medicine

## 2022-02-02 ENCOUNTER — Other Ambulatory Visit: Payer: BC Managed Care – PPO

## 2022-02-02 ENCOUNTER — Ambulatory Visit
Admission: RE | Admit: 2022-02-02 | Discharge: 2022-02-02 | Disposition: A | Discharge status: Home / Self Care | Payer: BC Managed Care – PPO | Source: Ambulatory Visit | Attending: Internal Medicine | Admitting: Internal Medicine

## 2022-02-02 ENCOUNTER — Other Ambulatory Visit: Payer: Self-pay

## 2022-02-02 DIAGNOSIS — E875 Hyperkalemia: Secondary | ICD-10-CM | POA: Diagnosis not present

## 2022-02-02 DIAGNOSIS — W19XXXA Unspecified fall, initial encounter: Secondary | ICD-10-CM | POA: Diagnosis not present

## 2022-02-02 DIAGNOSIS — S0990XA Unspecified injury of head, initial encounter: Secondary | ICD-10-CM | POA: Diagnosis not present

## 2022-02-02 DIAGNOSIS — H8111 Benign paroxysmal vertigo, right ear: Secondary | ICD-10-CM | POA: Diagnosis not present

## 2022-02-02 IMAGING — CT CT HEAD W/O CM
4 series · 15 of 47 positions shown, 17 images · non-contrast
Comparison: None.

CLINICAL DATA: Head injury after fall in bathroom last night, at
top of head. Left side arm/leg/body tingling. Left eye feels ALUNG,
dizzy vision changes.



[Series 2: head 5.00 hr40 s3 axial ibhc · axial · 0.44mm/px · z∈[-672,-552]mm · 6 of 34 slices shown, 8 images]
[im 5/34  brain]
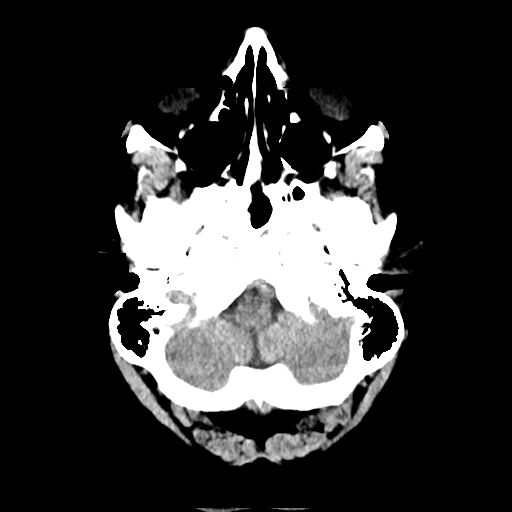
[im 5/34  bone]
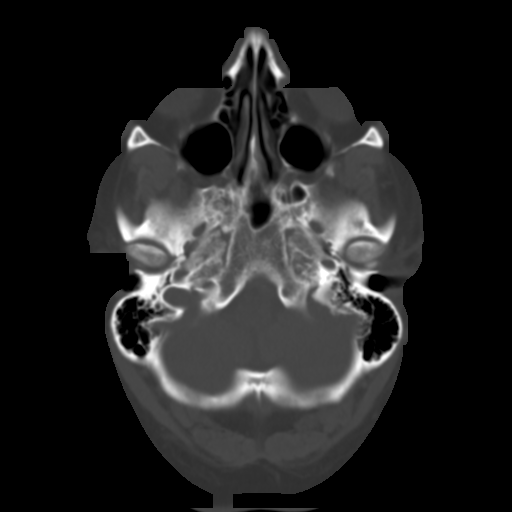
[im 10/34  brain]
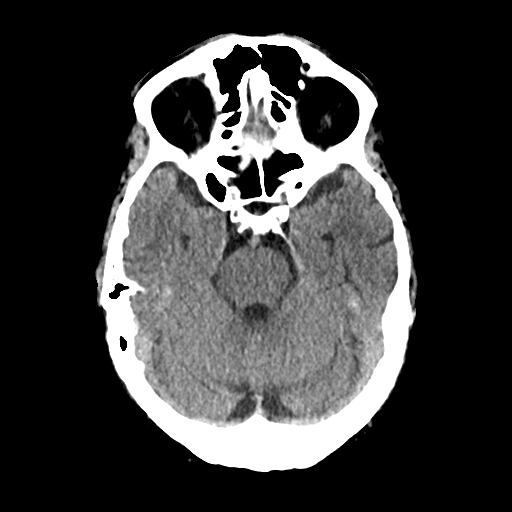
[im 15/34  brain]
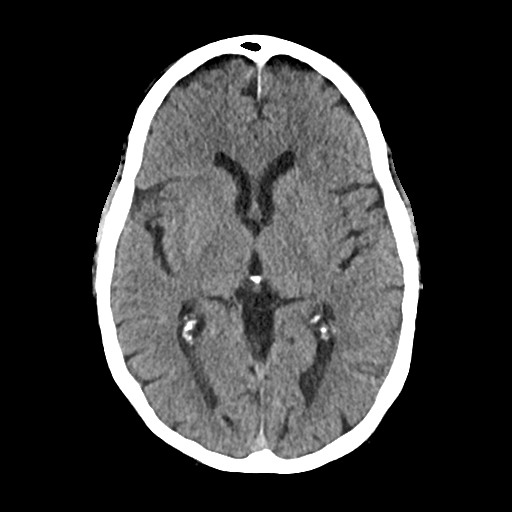
[im 19/34  brain]
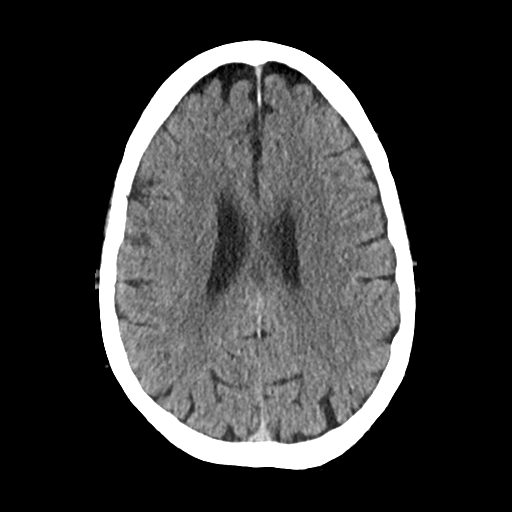
[im 24/34  brain]
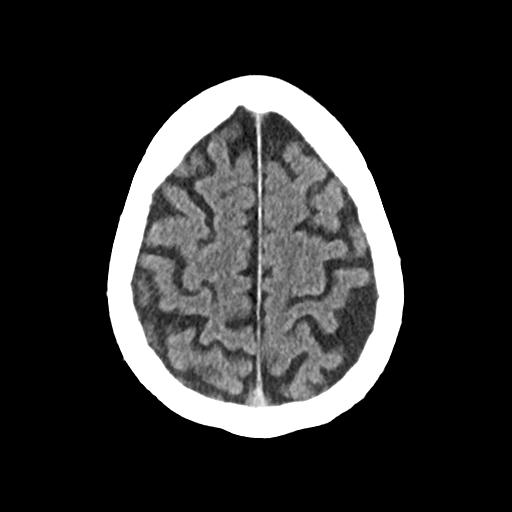
[im 24/34  bone]
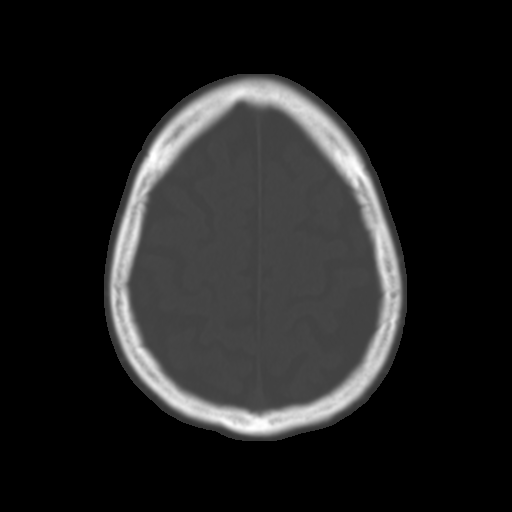
[im 29/34  brain]
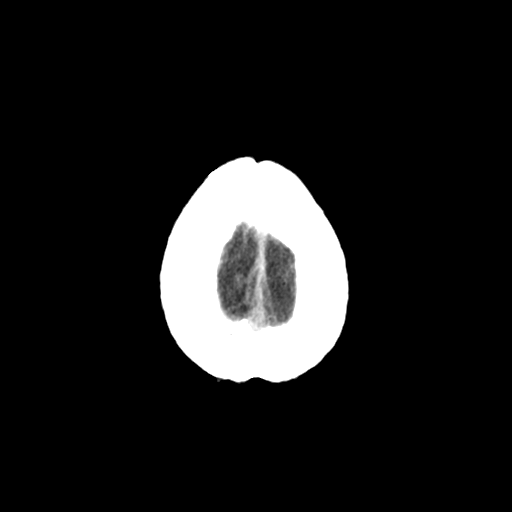

[Series 3: head 2.00 hr60 s3 axial bone · axial · 0.44mm/px · z∈[-678,-638]mm · 3 of 86 slices shown]
[im 9/86  bone]
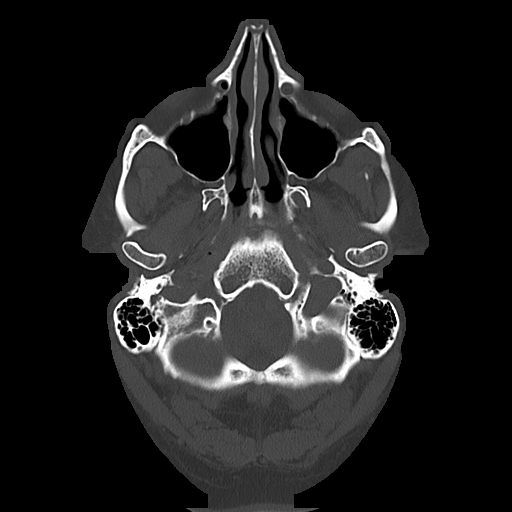
[im 17/86  bone]
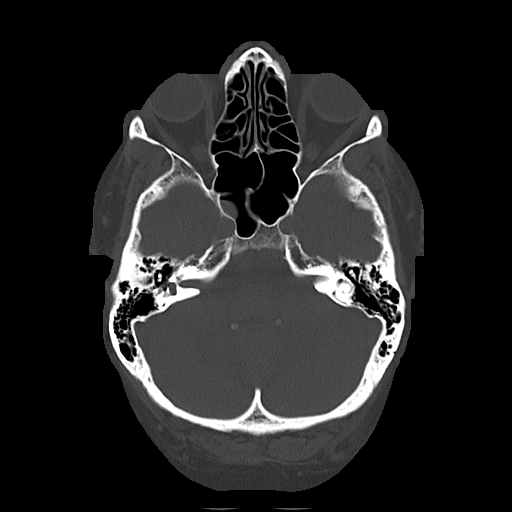
[im 29/86  bone]
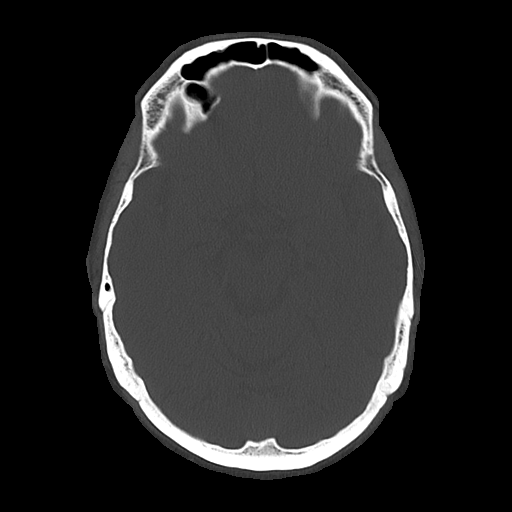

[Series 4: head 3.00 hr40 s3 sag · sagittal · 0.34mm/px · 3 of 74 slices shown]
[im 25/74  brain]
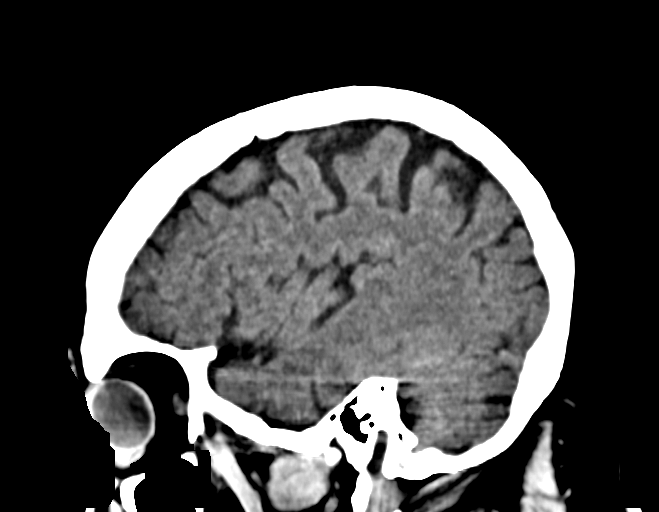
[im 37/74  brain]
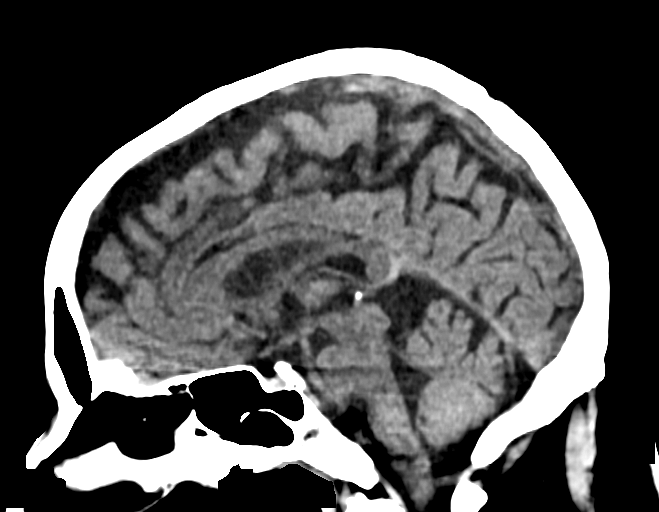
[im 49/74  brain]
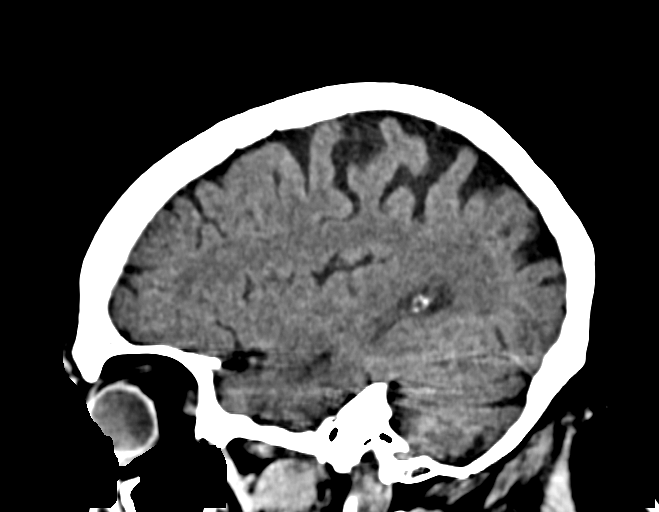

[Series 6: head 3.00 hr40 s3 cor · coronal · 0.34mm/px · 3 of 74 slices shown]
[im 25/74  brain]
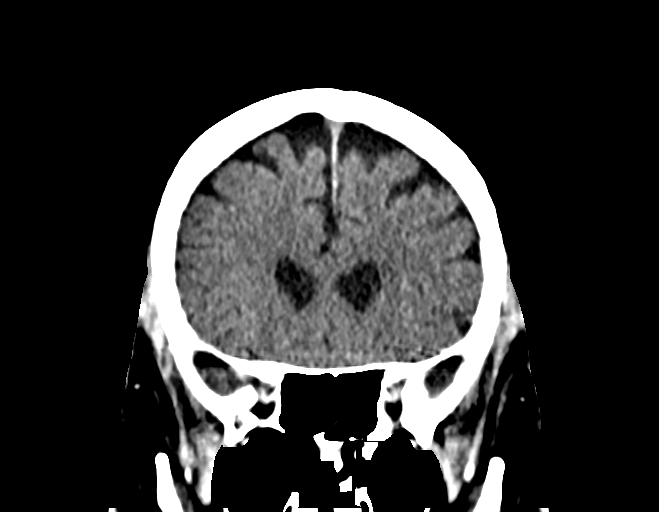
[im 33/74  brain]
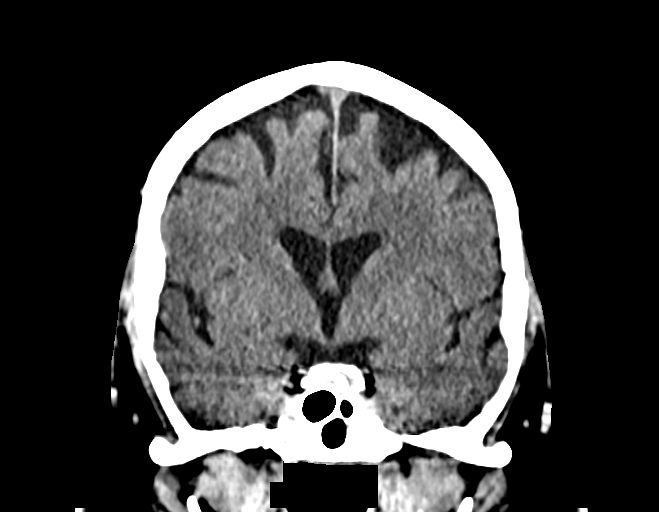
[im 41/74  brain]
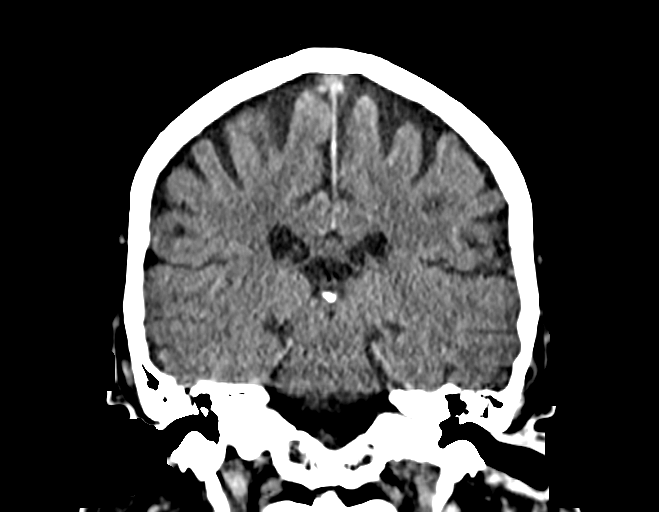

[15 of 47 positions shown; findings below may reference images not displayed]

FINDINGS: Brain: There is mild-to-moderate cortical atrophy, possibly mildly
advanced for patient age. The ventricles are normal in
configuration. The basilar cisterns are patent.

No mass, mass effect, or midline shift.

No acute intracranial hemorrhage is seen.

No abnormal extra-axial fluid collection.

Preservation of the normal cortical gray-white interface without CT
evidence of an acute major vascular territorial cortical based
infarction.

Vascular: No hyperdense vessel or unexpected calcification.

Skull: Normal. Negative for fracture or focal lesion.

Sinuses/Orbits: Status post bilateral ocular lens replacements. The
visualized paranasal sinuses and mastoid air cells are clear.

Other: None.
IMPRESSION: No acute intracranial process.

## 2022-02-04 ENCOUNTER — Encounter (HOSPITAL_COMMUNITY): Payer: Self-pay | Admitting: Emergency Medicine

## 2022-02-04 ENCOUNTER — Emergency Department (HOSPITAL_COMMUNITY): Payer: BC Managed Care – PPO

## 2022-02-04 ENCOUNTER — Inpatient Hospital Stay (HOSPITAL_COMMUNITY): Payer: BC Managed Care – PPO

## 2022-02-04 ENCOUNTER — Observation Stay (HOSPITAL_COMMUNITY): Payer: BC Managed Care – PPO

## 2022-02-04 ENCOUNTER — Inpatient Hospital Stay (HOSPITAL_COMMUNITY)
Admission: EM | Admit: 2022-02-04 | Discharge: 2022-02-05 | DRG: 065 | Disposition: A | Discharge status: Home Health | Payer: BC Managed Care – PPO | Attending: Internal Medicine | Admitting: Internal Medicine

## 2022-02-04 DIAGNOSIS — E669 Obesity, unspecified: Secondary | ICD-10-CM | POA: Diagnosis not present

## 2022-02-04 DIAGNOSIS — I129 Hypertensive chronic kidney disease with stage 1 through stage 4 chronic kidney disease, or unspecified chronic kidney disease: Secondary | ICD-10-CM | POA: Diagnosis present

## 2022-02-04 DIAGNOSIS — N1832 Chronic kidney disease, stage 3b: Secondary | ICD-10-CM | POA: Diagnosis present

## 2022-02-04 DIAGNOSIS — I639 Cerebral infarction, unspecified: Secondary | ICD-10-CM | POA: Diagnosis present

## 2022-02-04 DIAGNOSIS — R202 Paresthesia of skin: Secondary | ICD-10-CM | POA: Diagnosis not present

## 2022-02-04 DIAGNOSIS — Z820 Family history of epilepsy and other diseases of the nervous system: Secondary | ICD-10-CM

## 2022-02-04 DIAGNOSIS — E119 Type 2 diabetes mellitus without complications: Secondary | ICD-10-CM

## 2022-02-04 DIAGNOSIS — Z6833 Body mass index (BMI) 33.0-33.9, adult: Secondary | ICD-10-CM

## 2022-02-04 DIAGNOSIS — Z886 Allergy status to analgesic agent status: Secondary | ICD-10-CM

## 2022-02-04 DIAGNOSIS — F3132 Bipolar disorder, current episode depressed, moderate: Secondary | ICD-10-CM | POA: Diagnosis present

## 2022-02-04 DIAGNOSIS — E1122 Type 2 diabetes mellitus with diabetic chronic kidney disease: Secondary | ICD-10-CM | POA: Diagnosis present

## 2022-02-04 DIAGNOSIS — N281 Cyst of kidney, acquired: Secondary | ICD-10-CM | POA: Diagnosis not present

## 2022-02-04 DIAGNOSIS — Z881 Allergy status to other antibiotic agents status: Secondary | ICD-10-CM | POA: Diagnosis not present

## 2022-02-04 DIAGNOSIS — M109 Gout, unspecified: Secondary | ICD-10-CM | POA: Diagnosis present

## 2022-02-04 DIAGNOSIS — E785 Hyperlipidemia, unspecified: Secondary | ICD-10-CM | POA: Diagnosis present

## 2022-02-04 DIAGNOSIS — R29702 NIHSS score 2: Secondary | ICD-10-CM | POA: Diagnosis present

## 2022-02-04 DIAGNOSIS — R531 Weakness: Secondary | ICD-10-CM

## 2022-02-04 DIAGNOSIS — Z79899 Other long term (current) drug therapy: Secondary | ICD-10-CM | POA: Diagnosis not present

## 2022-02-04 DIAGNOSIS — G8194 Hemiplegia, unspecified affecting left nondominant side: Secondary | ICD-10-CM | POA: Diagnosis not present

## 2022-02-04 DIAGNOSIS — I63541 Cerebral infarction due to unspecified occlusion or stenosis of right cerebellar artery: Secondary | ICD-10-CM | POA: Diagnosis not present

## 2022-02-04 DIAGNOSIS — G459 Transient cerebral ischemic attack, unspecified: Secondary | ICD-10-CM | POA: Diagnosis present

## 2022-02-04 DIAGNOSIS — F314 Bipolar disorder, current episode depressed, severe, without psychotic features: Secondary | ICD-10-CM | POA: Diagnosis not present

## 2022-02-04 DIAGNOSIS — Z888 Allergy status to other drugs, medicaments and biological substances status: Secondary | ICD-10-CM

## 2022-02-04 DIAGNOSIS — Z8249 Family history of ischemic heart disease and other diseases of the circulatory system: Secondary | ICD-10-CM

## 2022-02-04 DIAGNOSIS — I4510 Unspecified right bundle-branch block: Secondary | ICD-10-CM | POA: Diagnosis not present

## 2022-02-04 DIAGNOSIS — Z7984 Long term (current) use of oral hypoglycemic drugs: Secondary | ICD-10-CM

## 2022-02-04 DIAGNOSIS — I6302 Cerebral infarction due to thrombosis of basilar artery: Secondary | ICD-10-CM | POA: Diagnosis not present

## 2022-02-04 DIAGNOSIS — N179 Acute kidney failure, unspecified: Secondary | ICD-10-CM | POA: Diagnosis present

## 2022-02-04 DIAGNOSIS — Z880 Allergy status to penicillin: Secondary | ICD-10-CM

## 2022-02-04 DIAGNOSIS — N183 Chronic kidney disease, stage 3 unspecified: Secondary | ICD-10-CM | POA: Diagnosis present

## 2022-02-04 DIAGNOSIS — G4733 Obstructive sleep apnea (adult) (pediatric): Secondary | ICD-10-CM | POA: Diagnosis not present

## 2022-02-04 DIAGNOSIS — I10 Essential (primary) hypertension: Secondary | ICD-10-CM | POA: Diagnosis present

## 2022-02-04 DIAGNOSIS — R42 Dizziness and giddiness: Secondary | ICD-10-CM | POA: Diagnosis not present

## 2022-02-04 DIAGNOSIS — R200 Anesthesia of skin: Secondary | ICD-10-CM | POA: Diagnosis not present

## 2022-02-04 DIAGNOSIS — I6389 Other cerebral infarction: Secondary | ICD-10-CM | POA: Diagnosis not present

## 2022-02-04 DIAGNOSIS — G319 Degenerative disease of nervous system, unspecified: Secondary | ICD-10-CM | POA: Diagnosis not present

## 2022-02-04 DIAGNOSIS — N182 Chronic kidney disease, stage 2 (mild): Secondary | ICD-10-CM | POA: Diagnosis present

## 2022-02-04 DIAGNOSIS — M1000 Idiopathic gout, unspecified site: Secondary | ICD-10-CM | POA: Diagnosis present

## 2022-02-04 LAB — I-STAT CHEM 8, ED
BUN: 41 mg/dL — ABNORMAL HIGH (ref 8–23)
Calcium, Ion: 1.2 mmol/L (ref 1.15–1.40)
Chloride: 105 mmol/L (ref 98–111)
Creatinine, Ser: 1.9 mg/dL — ABNORMAL HIGH (ref 0.61–1.24)
Glucose, Bld: 120 mg/dL — ABNORMAL HIGH (ref 70–99)
HCT: 45 % (ref 39.0–52.0)
Hemoglobin: 15.3 g/dL (ref 13.0–17.0)
Potassium: 4.4 mmol/L (ref 3.5–5.1)
Sodium: 140 mmol/L (ref 135–145)
TCO2: 26 mmol/L (ref 22–32)

## 2022-02-04 LAB — URINALYSIS, MICROSCOPIC (REFLEX)
Bacteria, UA: NONE SEEN
RBC / HPF: NONE SEEN RBC/hpf (ref 0–5)
WBC, UA: NONE SEEN WBC/hpf (ref 0–5)

## 2022-02-04 LAB — RAPID URINE DRUG SCREEN, HOSP PERFORMED
Amphetamines: NOT DETECTED
Barbiturates: NOT DETECTED
Benzodiazepines: NOT DETECTED
Cocaine: NOT DETECTED
Opiates: NOT DETECTED
Tetrahydrocannabinol: NOT DETECTED

## 2022-02-04 LAB — URINALYSIS, ROUTINE W REFLEX MICROSCOPIC
Bilirubin Urine: NEGATIVE
Glucose, UA: NEGATIVE mg/dL
Hgb urine dipstick: NEGATIVE
Ketones, ur: NEGATIVE mg/dL
Leukocytes,Ua: NEGATIVE
Nitrite: NEGATIVE
Protein, ur: 30 mg/dL — AB
Specific Gravity, Urine: 1.01 (ref 1.005–1.030)
pH: 6 (ref 5.0–8.0)

## 2022-02-04 LAB — PROTIME-INR
INR: 1 (ref 0.8–1.2)
Prothrombin Time: 12.9 seconds (ref 11.4–15.2)

## 2022-02-04 LAB — CBC
HCT: 46.7 % (ref 39.0–52.0)
Hemoglobin: 15.8 g/dL (ref 13.0–17.0)
MCH: 31.2 pg (ref 26.0–34.0)
MCHC: 33.8 g/dL (ref 30.0–36.0)
MCV: 92.1 fL (ref 80.0–100.0)
Platelets: 251 10*3/uL (ref 150–400)
RBC: 5.07 MIL/uL (ref 4.22–5.81)
RDW: 12.8 % (ref 11.5–15.5)
WBC: 10.1 10*3/uL (ref 4.0–10.5)
nRBC: 0 % (ref 0.0–0.2)

## 2022-02-04 LAB — COMPREHENSIVE METABOLIC PANEL
ALT: 35 U/L (ref 0–44)
AST: 23 U/L (ref 15–41)
Albumin: 4.3 g/dL (ref 3.5–5.0)
Alkaline Phosphatase: 62 U/L (ref 38–126)
Anion gap: 10 (ref 5–15)
BUN: 46 mg/dL — ABNORMAL HIGH (ref 8–23)
CO2: 23 mmol/L (ref 22–32)
Calcium: 9.3 mg/dL (ref 8.9–10.3)
Chloride: 106 mmol/L (ref 98–111)
Creatinine, Ser: 1.74 mg/dL — ABNORMAL HIGH (ref 0.61–1.24)
GFR, Estimated: 44 mL/min — ABNORMAL LOW (ref >60)
Glucose, Bld: 113 mg/dL — ABNORMAL HIGH (ref 70–99)
Potassium: 4.3 mmol/L (ref 3.5–5.1)
Sodium: 139 mmol/L (ref 135–145)
Total Bilirubin: 0.3 mg/dL (ref 0.3–1.2)
Total Protein: 7.4 g/dL (ref 6.5–8.1)

## 2022-02-04 LAB — GLUCOSE, CAPILLARY: Glucose-Capillary: 94 mg/dL (ref 70–99)

## 2022-02-04 LAB — DIFFERENTIAL
Abs Immature Granulocytes: 0.02 10*3/uL (ref 0.00–0.07)
Basophils Absolute: 0.1 10*3/uL (ref 0.0–0.1)
Basophils Relative: 1 %
Eosinophils Absolute: 0.2 10*3/uL (ref 0.0–0.5)
Eosinophils Relative: 2 %
Immature Granulocytes: 0 %
Lymphocytes Relative: 22 %
Lymphs Abs: 2.2 10*3/uL (ref 0.7–4.0)
Monocytes Absolute: 0.8 10*3/uL (ref 0.1–1.0)
Monocytes Relative: 8 %
Neutro Abs: 6.9 10*3/uL (ref 1.7–7.7)
Neutrophils Relative %: 67 %

## 2022-02-04 LAB — LIPID PANEL
Cholesterol: 159 mg/dL (ref 0–200)
HDL: 49 mg/dL (ref >40)
LDL Cholesterol: 96 mg/dL (ref 0–99)
Total CHOL/HDL Ratio: 3.2 RATIO
Triglycerides: 68 mg/dL (ref <150)
VLDL: 14 mg/dL (ref 0–40)

## 2022-02-04 LAB — APTT: aPTT: 29 seconds (ref 24–36)

## 2022-02-04 LAB — HEMOGLOBIN A1C
Hgb A1c MFr Bld: 5.6 % (ref 4.8–5.6)
Mean Plasma Glucose: 114.02 mg/dL

## 2022-02-04 IMAGING — MR MR HEAD W/O CM
10 series · 48 of 48 positions shown · IV contrast (gadavist)
Comparison: Plain head CT [4K] hours today and earlier.

CLINICAL DATA: 65-year-old male left side numbness, dizziness and
abnormal gait.

EXAM:
MRI HEAD WITHOUT CONTRAST
MRA HEAD WITHOUT CONTRAST
MRA NECK WITHOUT AND WITH CONTRAST
TECHNIQUE: Multiplanar, multiecho pulse sequences of the brain and surrounding
structures were obtained without intravenous contrast. Angiographic
images of the Circle of Willis were obtained using MRA technique
without intravenous contrast. Angiographic images of the neck were
obtained using MRA technique without and with intravenous contrast.
Carotid stenosis measurements (when applicable) are obtained
utilizing NASCET criteria, using the distal internal carotid
diameter as the denominator.
CONTRAST:  10mL GADAVIST GADOBUTROL 1 MMOL/ML IV SOLN

[Series 5: DWI · axial · 3.0mm · 1.36mm/px · z∈[-63,+71]mm · 7 of 96 slices shown (1 of 2)]
[im 1/96]
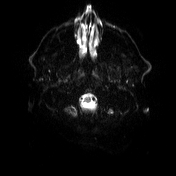
[im 16/96]
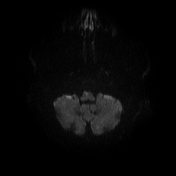
[im 32/96]
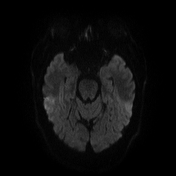
[im 48/96]
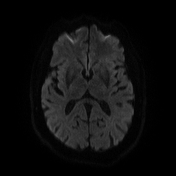
[im 64/96]
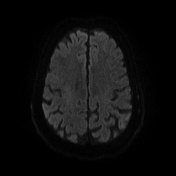
[im 80/96]
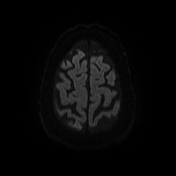
[im 96/96]
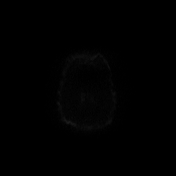

[Series 6: DWI · axial · 3.0mm · 1.36mm/px · z∈[-63,+71]mm · 4 of 48 slices shown (2 of 2)]
[im 1/48]
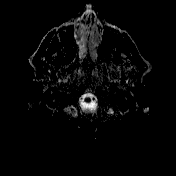
[im 16/48]
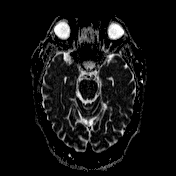
[im 32/48]
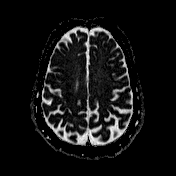
[im 48/48]
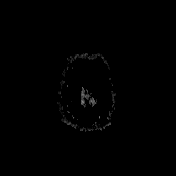

[Series 7: T1 · sagittal · 5.0mm · 0.75mm/px · 2 of 24 slices shown (1 of 2)]
[im 1/24]
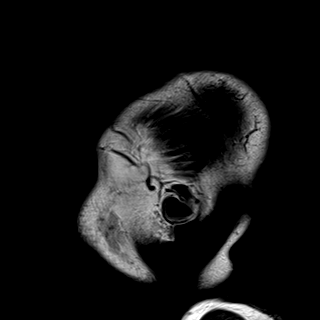
[im 24/24]
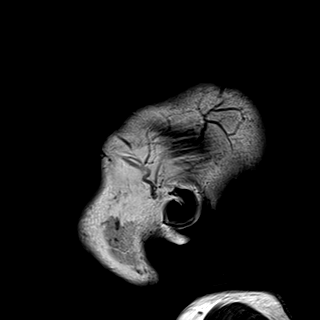

[Series 8: T2 · axial · 5.0mm · 0.62mm/px · z∈[-72,+83]mm · 2 of 26 slices shown (1 of 2)]
[im 1/26]
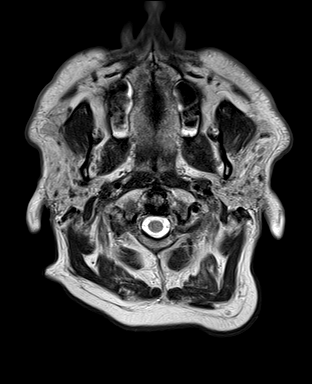
[im 26/26]
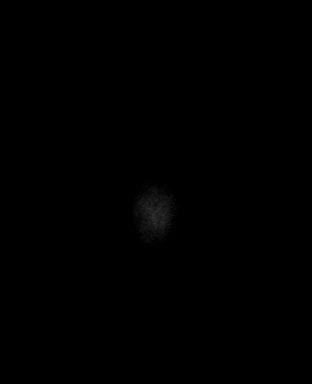

[Series 9: swi_images · axial · 3.0mm · 0.75mm/px · z∈[-96,+107]mm · 6 of 72 slices shown]
[im 1/72]
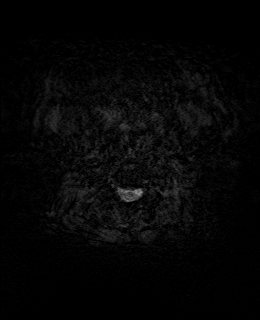
[im 15/72]
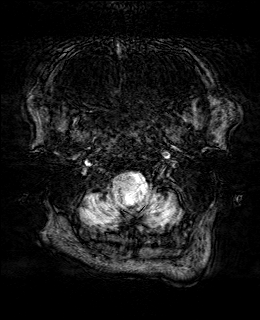
[im 29/72]
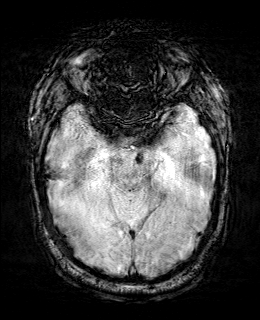
[im 43/72]
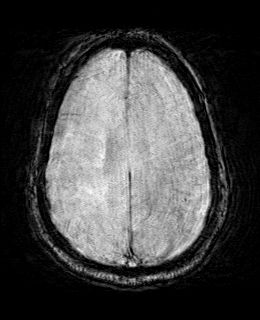
[im 57/72]
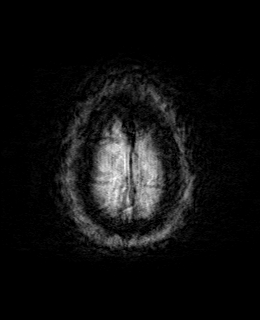
[im 72/72]
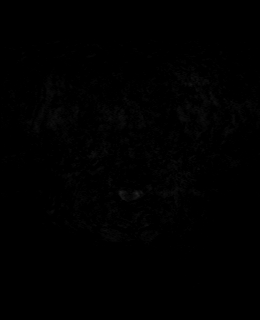

[Series 11: FLAIR · axial · 3.0mm · 0.75mm/px · z∈[-68,+79]mm · 4 of 52 slices shown]
[im 1/52]
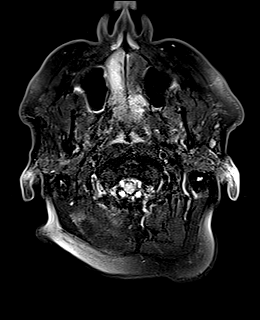
[im 18/52]
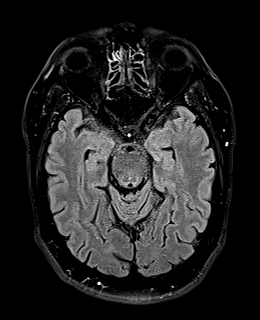
[im 35/52]
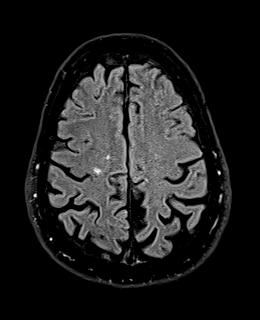
[im 52/52]
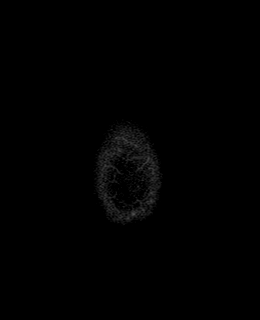

[Series 12: T1 · axial · 1.0mm · 0.94mm/px · z∈[-60,+77]mm · 12 of 144 slices shown (2 of 2)]
[im 1/144]
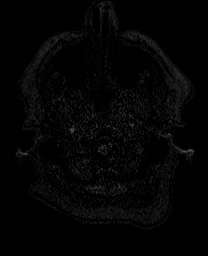
[im 14/144]
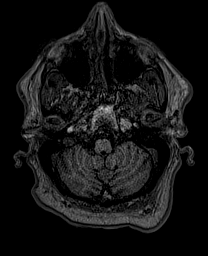
[im 27/144]
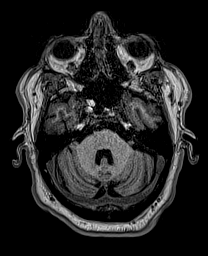
[im 40/144]
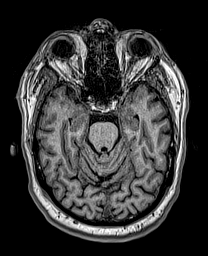
[im 53/144]
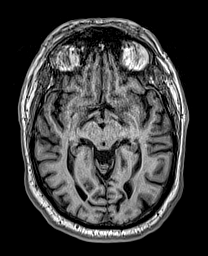
[im 66/144]
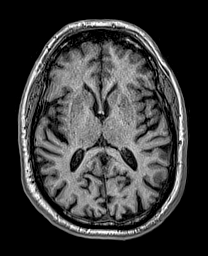
[im 79/144]
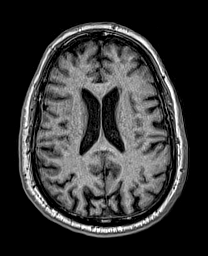
[im 92/144]
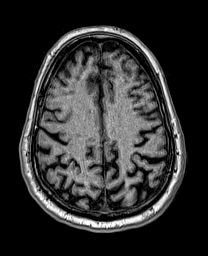
[im 105/144]
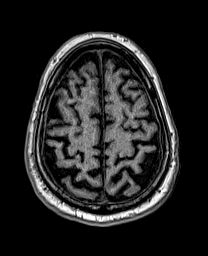
[im 118/144]
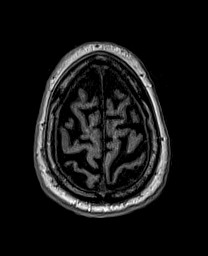
[im 131/144]
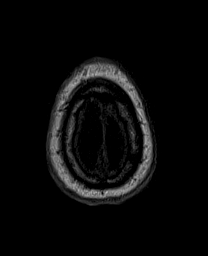
[im 144/144]
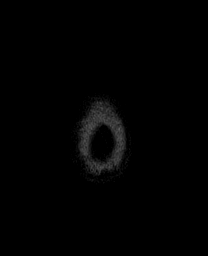

[Series 13: cor dwi_tracew · coronal · 5.0mm · 1.53mm/px · 5 of 64 slices shown]
[im 1/64]
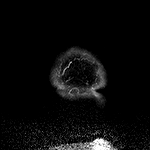
[im 16/64]
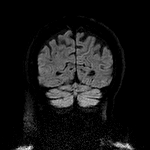
[im 32/64]
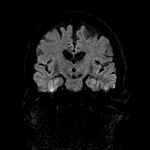
[im 48/64]
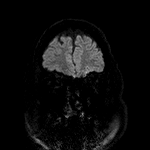
[im 64/64]
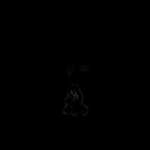

[Series 14: cor dwi_adc · coronal · 5.0mm · 1.53mm/px · 3 of 32 slices shown]
[im 1/32]
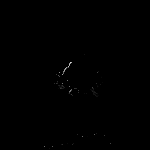
[im 16/32]
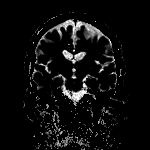
[im 32/32]
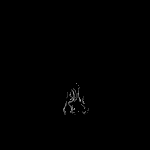

[Series 15: T2 · coronal · 5.0mm · 0.57mm/px · 3 of 38 slices shown (2 of 2)]
[im 1/38]
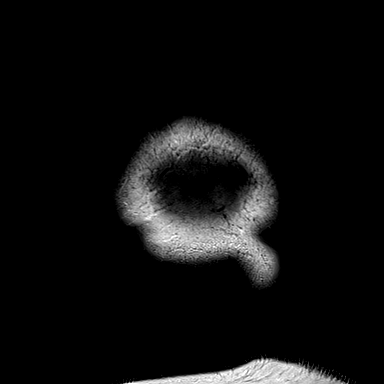
[im 19/38]
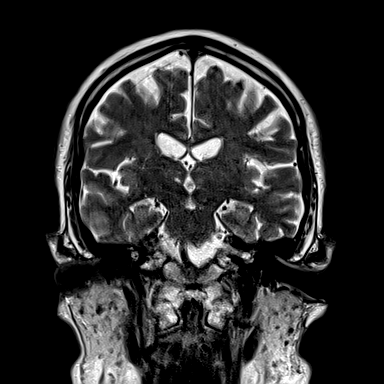
[im 38/38]
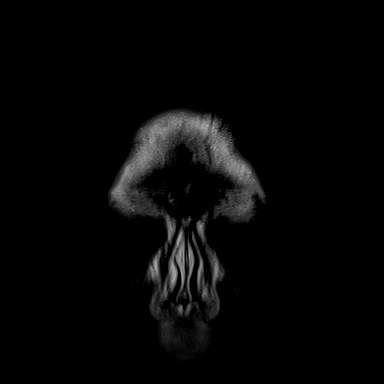

[48 of 48 positions shown; findings below may reference images not displayed]

FINDINGS: MRI HEAD FINDINGS

Brain: Cerebral volume is within normal limits for age. No midline
shift, mass effect, evidence of mass lesion, ventriculomegaly,
extra-axial collection or acute intracranial hemorrhage.
Cervicomedullary junction and pituitary are within normal limits.

Confluent small area of restricted diffusion in the right medullary
pyramid, tracking into the paracentral dorsal medulla on series 5,
image 57. Faint associated T2 and FLAIR hyperintensity. No acute
brainstem hemorrhage. No mass effect.

No other restricted diffusion. Small chronic infarct infarct in the
posterior right cerebellum also on series 8, image 6. And evidence
of a chronic hemorrhage in the left midbrain on series 9, image 29.

But otherwise largely normal for age gray and white matter signal in
the brain. Mild for age superior frontal lobe nonspecific white
matter T2 and FLAIR hyperintensity. No other chronic cerebral blood
products identified. No cerebral cortical encephalomalacia.

Vascular: Major intracranial vascular flow voids are preserved, the
distal left vertebral artery appears dominant, but see MRA findings
below.

Skull and upper cervical spine: Negative for age visible cervical
spine. Visualized bone marrow signal is within normal limits.

Sinuses/Orbits: Postoperative changes to both globes. Otherwise
negative orbits. Paranasal Visualized paranasal sinuses and mastoids
are clear.

Other: Visible internal auditory structures appear normal. Negative
visible scalp and face.

MRA NECK FINDINGS

Precontrast time-of-flight images reveal a 3 vessel arch
configuration with antegrade flow in the bilateral cervical carotid
and vertebral arteries throughout the neck. The left vertebral
artery appears dominant and the right somewhat diminutive
throughout. Both carotid bifurcations are patent.

Post-contrast neck MRA images. Proximal great vessels appear within
normal limits.

Right CCA is patent without stenosis, mild tortuosity. The right
carotid bifurcation there is mostly ECA origin irregularity (series
101, image 10). No cervical right ICA stenosis. Right ICA siphon and
terminus also appear within normal limits.

Mildly to moderately tortuous left CCA with no stenosis. Capacious
left carotid bifurcation. Cervical left ICA and also visible left
ICA siphon and terminus appear patent without stenosis.

No proximal subclavian artery stenosis identified, tortuous proximal
right subclavian artery. Normal left vertebral artery origin with
tortuous V1 segment. Dominant left vertebral artery without stenosis
to the basilar.

Diminutive right vertebral artery is patent and enhancing throughout
the neck with no convincing extracranial stenosis. However, the
distal right V4 segment appears beaded and irregular on series 104,
image 5

MRA HEAD FINDINGS

Intermittently degraded by mild motion artifact. And accidentally
the distal vertebral arteries and proximal basilar were not imaged.

Visible basilar artery is patent with mild irregularity. Basilar
tip, SCA and PCA origins are normal. Posterior communicating
arteries are diminutive or absent. Bilateral PCA branches are within
normal limits.

Visible ICA siphons are patent without stenosis. Patent carotid
termini, MCA and ACA origins. Codominant A1 segments. MCA M1
segments and bifurcations appear patent. But other MCA and ACA
branch detail is degraded by motion.
IMPRESSION: 1. Small acute infarct in the Right Medullary Pyramid (medullary
perforator artery territory, see #2). No associated acute hemorrhage
or mass effect.

2. Neck MRA demonstrates a non-dominant Right Vertebral Artery with
evidence of moderate to severe distal Right Vertebral (V4) segment
stenosis, concordant with #1. No large vessel occlusion.

3. Intracranial MRA is degraded.  No large vessel occlusion.

4. Small chronic infarct in the right cerebellum, and evidence of a
chronic micro-hemorrhage in the left midbrain. Mild for age
superimposed cerebral white matter signal changes, most commonly due
to small vessel disease.

## 2022-02-04 IMAGING — MR MR MRA NECK WO/W CM
5 of 7 series · 25 of 48 positions shown · IV contrast (gadavist)
Comparison: Plain head CT [4K] hours today and earlier.

CLINICAL DATA: 65-year-old male left side numbness, dizziness and
abnormal gait.

EXAM:
MRI HEAD WITHOUT CONTRAST
MRA HEAD WITHOUT CONTRAST
MRA NECK WITHOUT AND WITH CONTRAST
TECHNIQUE: Multiplanar, multiecho pulse sequences of the brain and surrounding
structures were obtained without intravenous contrast. Angiographic
images of the Circle of Willis were obtained using MRA technique
without intravenous contrast. Angiographic images of the neck were
obtained using MRA technique without and with intravenous contrast.
Carotid stenosis measurements (when applicable) are obtained
utilizing NASCET criteria, using the distal internal carotid
diameter as the denominator.
CONTRAST:  10mL GADAVIST GADOBUTROL 1 MMOL/ML IV SOLN

[Series 3: tof_2d_tra include arch · axial · 3.5mm · 0.43mm/px · z∈[-289,-22]mm · 7 of 110 slices shown (1 of 2)]
[im 1/110]
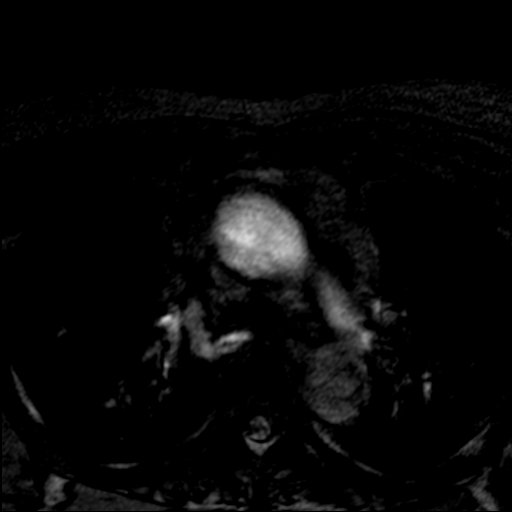
[im 19/110]
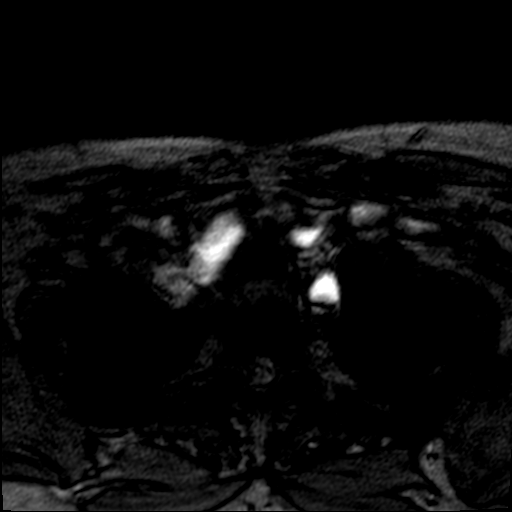
[im 37/110]
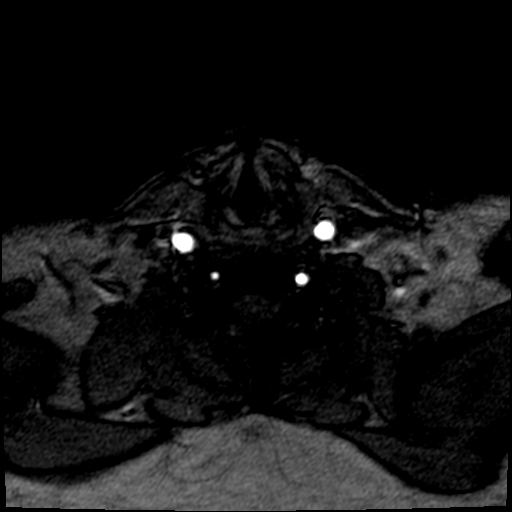
[im 55/110]
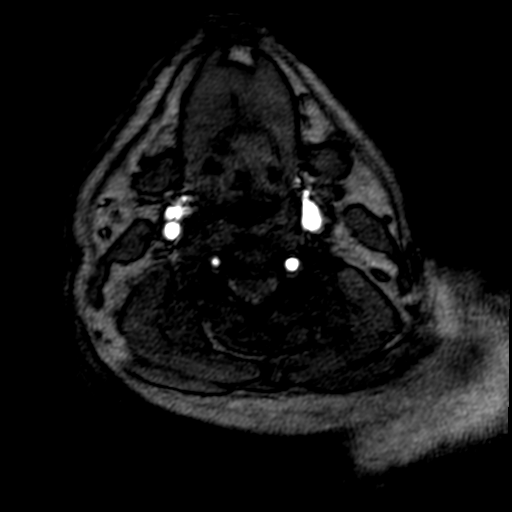
[im 73/110]
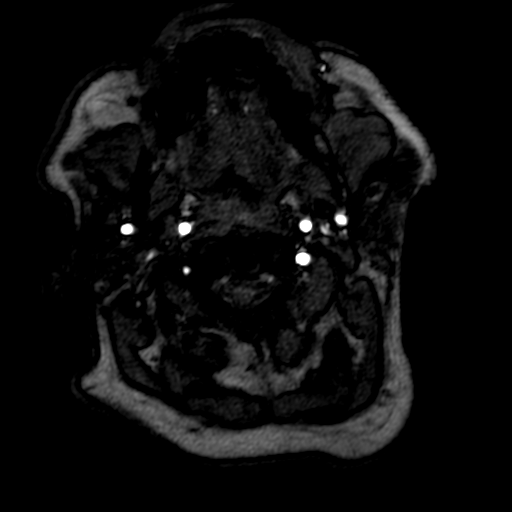
[im 91/110]
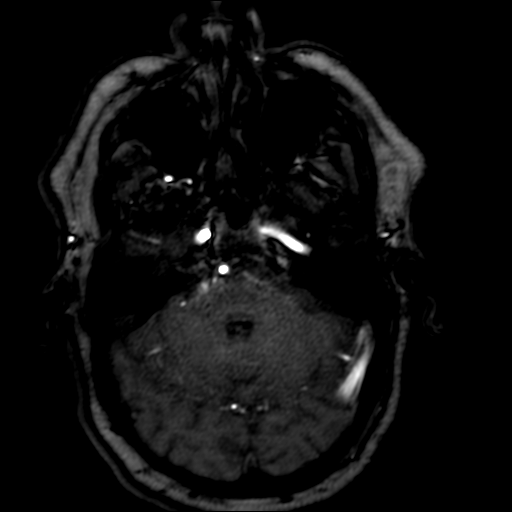
[im 110/110]
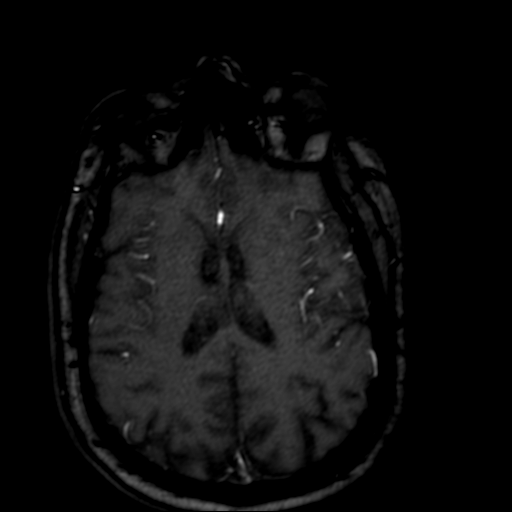

[Series 6: tof_2d_tra include arch · axial · 270.5mm · 0.43mm/px · 1 of 1 slices shown (2 of 2)]
[im 1/1]
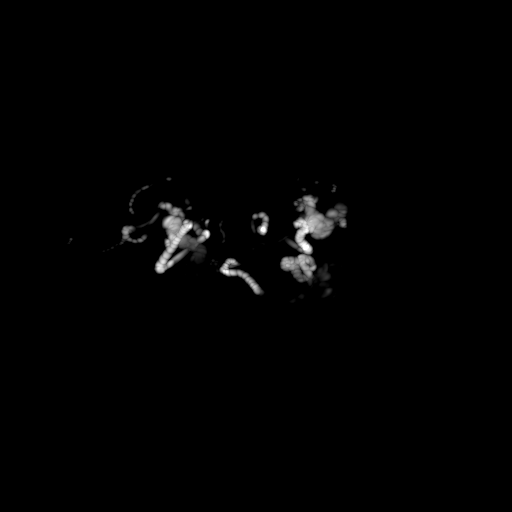

[Series 7: (id)_cor_pre · coronal · 0.8mm · 0.78mm/px · 8 of 112 slices shown]
[im 1/112]
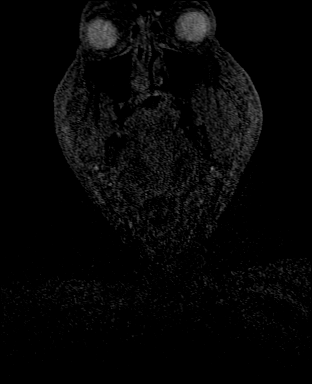
[im 16/112]
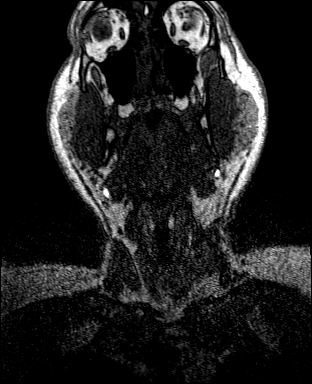
[im 32/112]
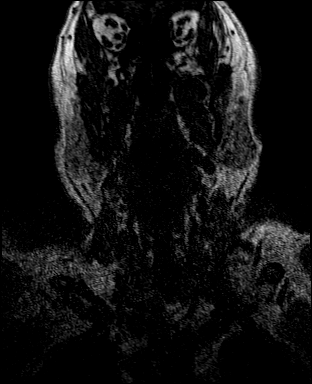
[im 48/112]
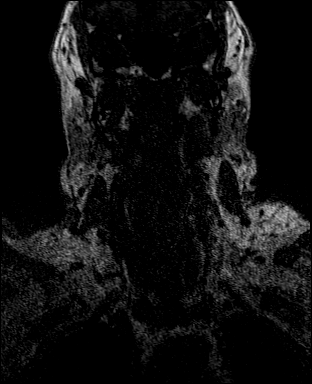
[im 64/112]
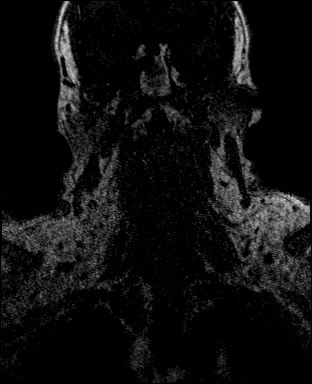
[im 80/112]
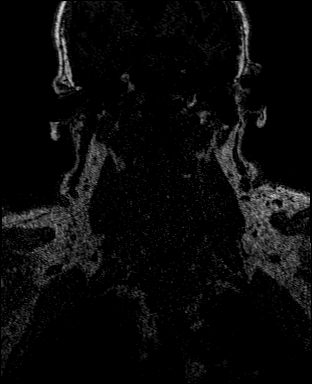
[im 96/112]
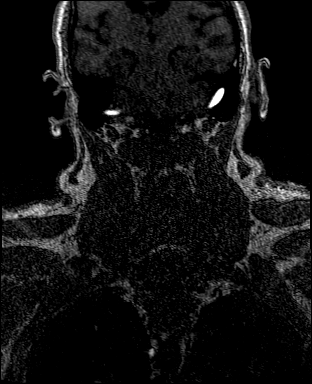
[im 112/112]
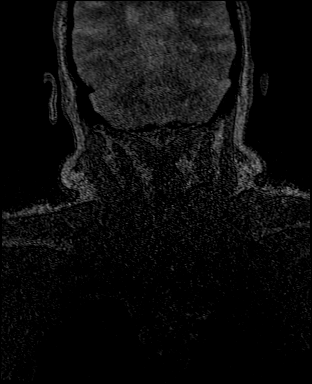

[Series 9: (id)_cor_post · coronal · 0.8mm · 0.78mm/px · 8 of 109 slices shown]
[im 1/109]
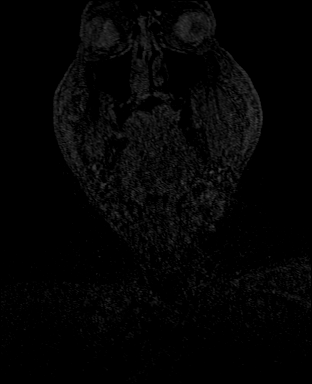
[im 16/109]
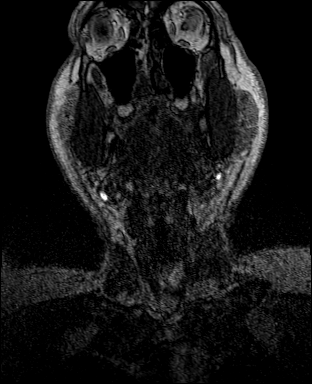
[im 31/109]
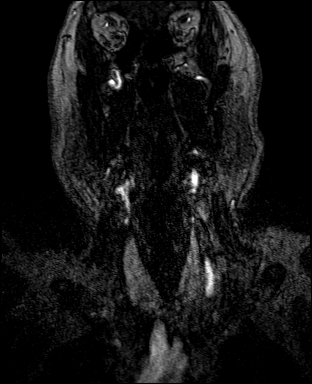
[im 47/109]
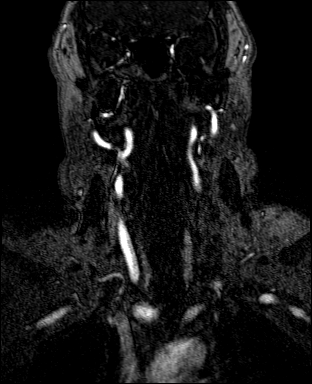
[im 62/109]
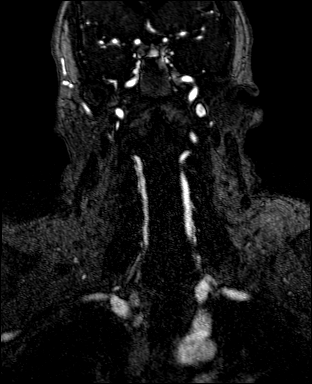
[im 78/109]
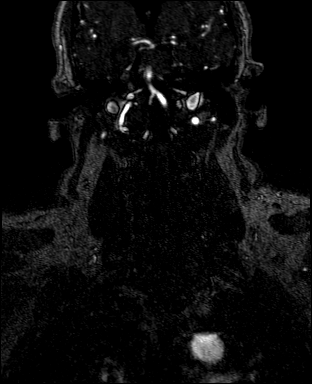
[im 93/109]
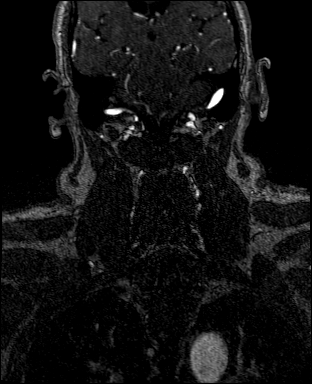
[im 109/109]
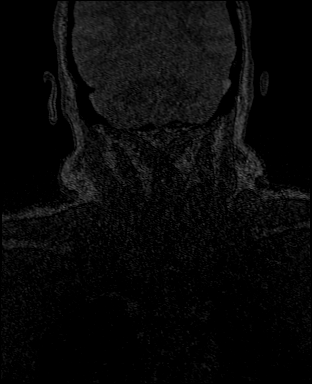

[Series 10: (id)_cor_post_sub · coronal · 0.8mm · 0.78mm/px · 1 of 107 slices shown]
[im 1/107]
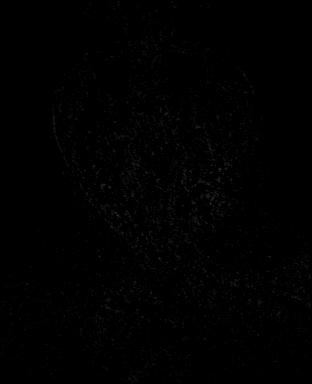

[25 of 48 positions shown; findings below may reference images not displayed]

FINDINGS: MRI HEAD FINDINGS

Brain: Cerebral volume is within normal limits for age. No midline
shift, mass effect, evidence of mass lesion, ventriculomegaly,
extra-axial collection or acute intracranial hemorrhage.
Cervicomedullary junction and pituitary are within normal limits.

Confluent small area of restricted diffusion in the right medullary
pyramid, tracking into the paracentral dorsal medulla on series 5,
image 57. Faint associated T2 and FLAIR hyperintensity. No acute
brainstem hemorrhage. No mass effect.

No other restricted diffusion. Small chronic infarct infarct in the
posterior right cerebellum also on series 8, image 6. And evidence
of a chronic hemorrhage in the left midbrain on series 9, image 29.

But otherwise largely normal for age gray and white matter signal in
the brain. Mild for age superior frontal lobe nonspecific white
matter T2 and FLAIR hyperintensity. No other chronic cerebral blood
products identified. No cerebral cortical encephalomalacia.

Vascular: Major intracranial vascular flow voids are preserved, the
distal left vertebral artery appears dominant, but see MRA findings
below.

Skull and upper cervical spine: Negative for age visible cervical
spine. Visualized bone marrow signal is within normal limits.

Sinuses/Orbits: Postoperative changes to both globes. Otherwise
negative orbits. Paranasal Visualized paranasal sinuses and mastoids
are clear.

Other: Visible internal auditory structures appear normal. Negative
visible scalp and face.

MRA NECK FINDINGS

Precontrast time-of-flight images reveal a 3 vessel arch
configuration with antegrade flow in the bilateral cervical carotid
and vertebral arteries throughout the neck. The left vertebral
artery appears dominant and the right somewhat diminutive
throughout. Both carotid bifurcations are patent.

Post-contrast neck MRA images. Proximal great vessels appear within
normal limits.

Right CCA is patent without stenosis, mild tortuosity. The right
carotid bifurcation there is mostly ECA origin irregularity (series
101, image 10). No cervical right ICA stenosis. Right ICA siphon and
terminus also appear within normal limits.

Mildly to moderately tortuous left CCA with no stenosis. Capacious
left carotid bifurcation. Cervical left ICA and also visible left
ICA siphon and terminus appear patent without stenosis.

No proximal subclavian artery stenosis identified, tortuous proximal
right subclavian artery. Normal left vertebral artery origin with
tortuous V1 segment. Dominant left vertebral artery without stenosis
to the basilar.

Diminutive right vertebral artery is patent and enhancing throughout
the neck with no convincing extracranial stenosis. However, the
distal right V4 segment appears beaded and irregular on series 104,
image 5

MRA HEAD FINDINGS

Intermittently degraded by mild motion artifact. And accidentally
the distal vertebral arteries and proximal basilar were not imaged.

Visible basilar artery is patent with mild irregularity. Basilar
tip, SCA and PCA origins are normal. Posterior communicating
arteries are diminutive or absent. Bilateral PCA branches are within
normal limits.

Visible ICA siphons are patent without stenosis. Patent carotid
termini, MCA and ACA origins. Codominant A1 segments. MCA M1
segments and bifurcations appear patent. But other MCA and ACA
branch detail is degraded by motion.
IMPRESSION: 1. Small acute infarct in the Right Medullary Pyramid (medullary
perforator artery territory, see #2). No associated acute hemorrhage
or mass effect.

2. Neck MRA demonstrates a non-dominant Right Vertebral Artery with
evidence of moderate to severe distal Right Vertebral (V4) segment
stenosis, concordant with #1. No large vessel occlusion.

3. Intracranial MRA is degraded.  No large vessel occlusion.

4. Small chronic infarct in the right cerebellum, and evidence of a
chronic micro-hemorrhage in the left midbrain. Mild for age
superimposed cerebral white matter signal changes, most commonly due
to small vessel disease.

## 2022-02-04 IMAGING — CT CT HEAD W/O CM
3 of 4 series · 14 of 47 positions shown, 16 images · non-contrast
Comparison: [DATE].

CLINICAL DATA: Left-sided numbness, gait difficulty, dizziness,
stroke suspected.



[Series 5: coronal soft tissue · coronal · 0.33mm/px · 3 of 72 slices shown]
[im 24/72  brain]
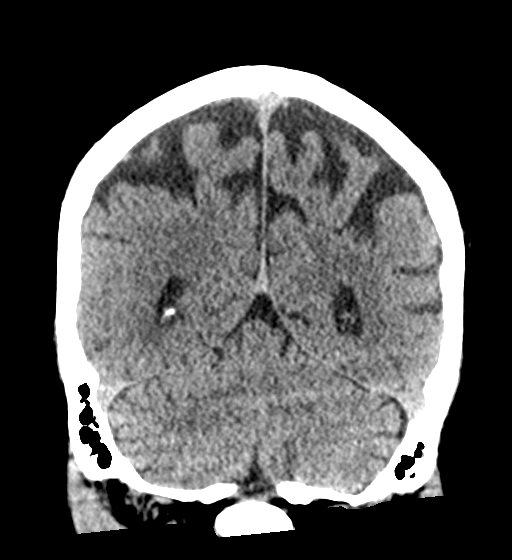
[im 32/72  brain]
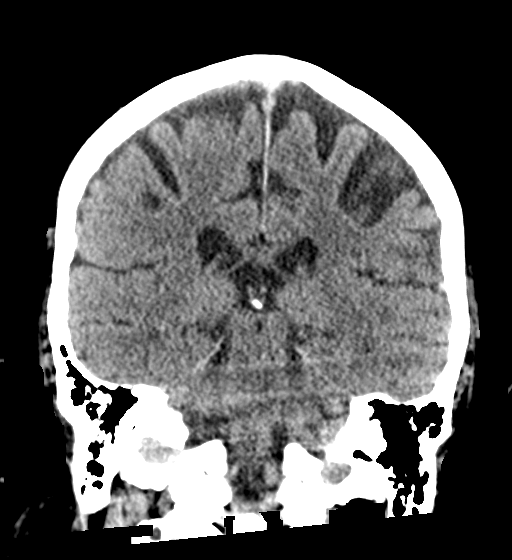
[im 40/72  brain]
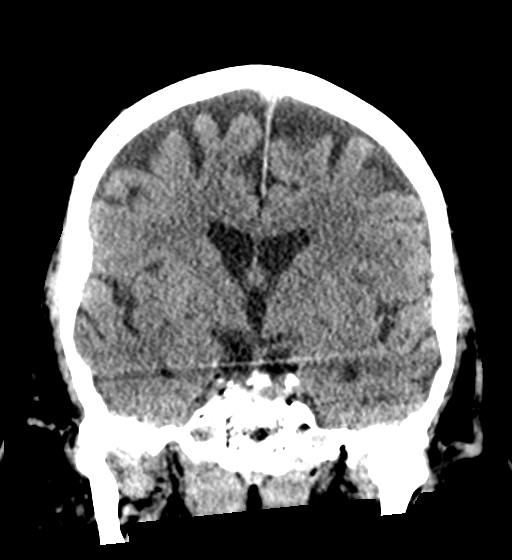

[Series 6: sagittal soft tissue · sagittal · 0.37mm/px · 3 of 58 slices shown]
[im 20/58  brain]
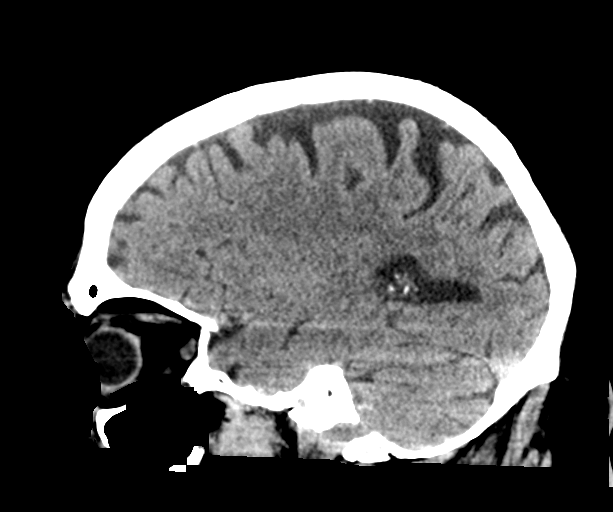
[im 29/58  brain]
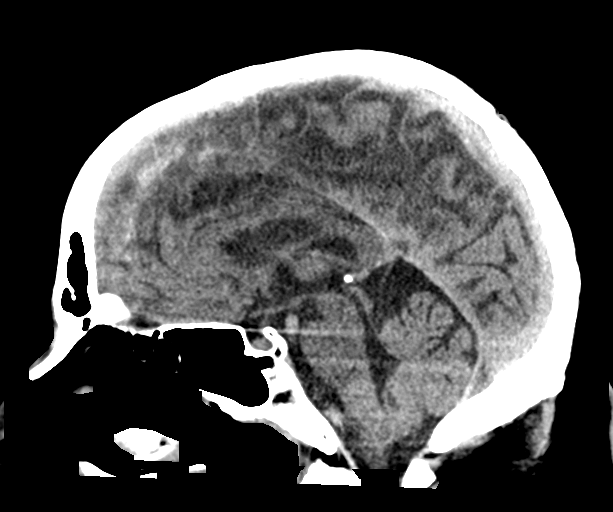
[im 38/58  brain]
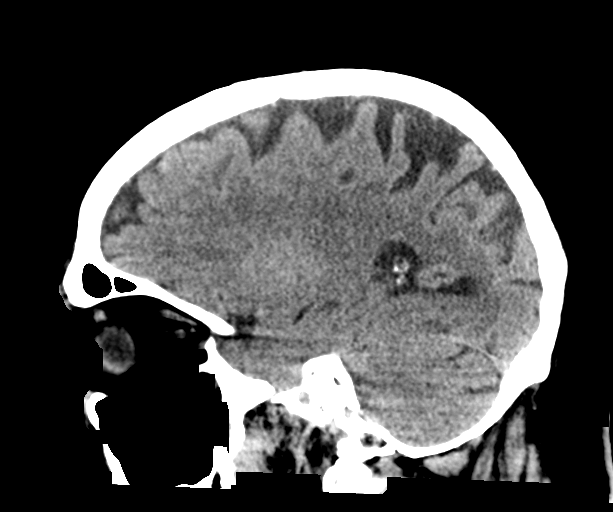

[Series 7: true axial · axial · 0.33mm/px · z∈[-140,+4]mm · 8 of 63 slices shown, 10 images]
[im 7/63  brain]
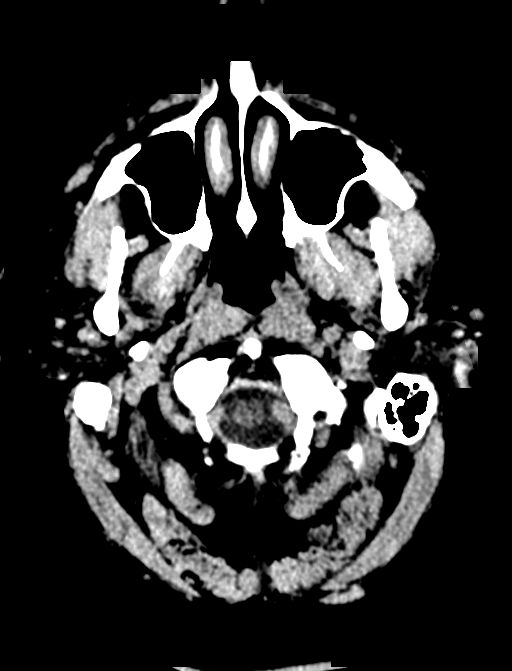
[im 7/63  bone]
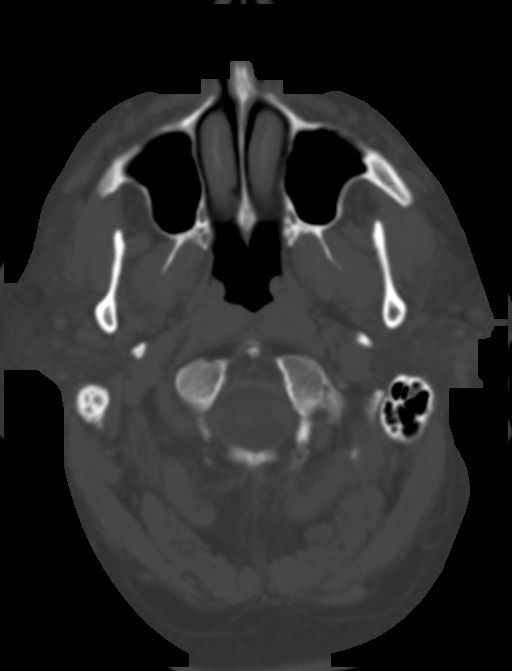
[im 14/63  brain]
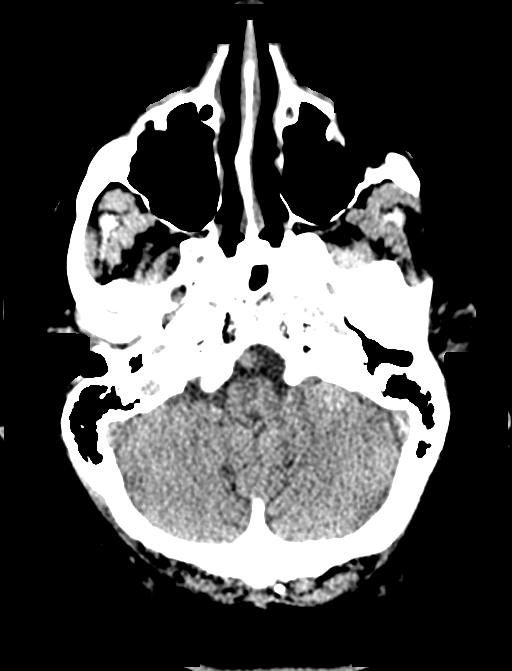
[im 21/63  brain]
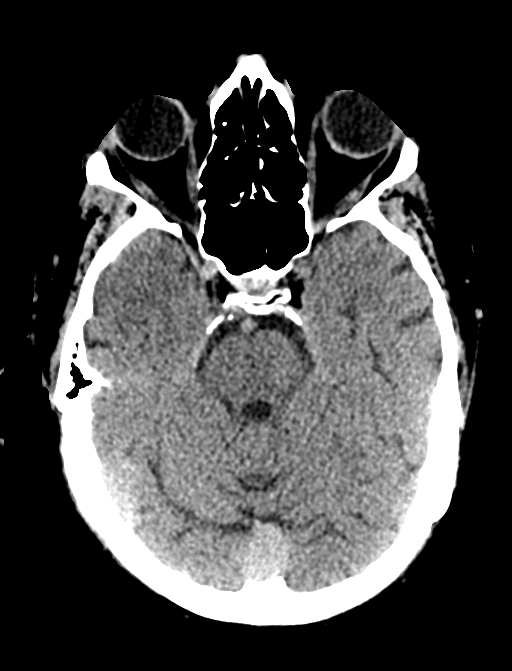
[im 28/63  brain]
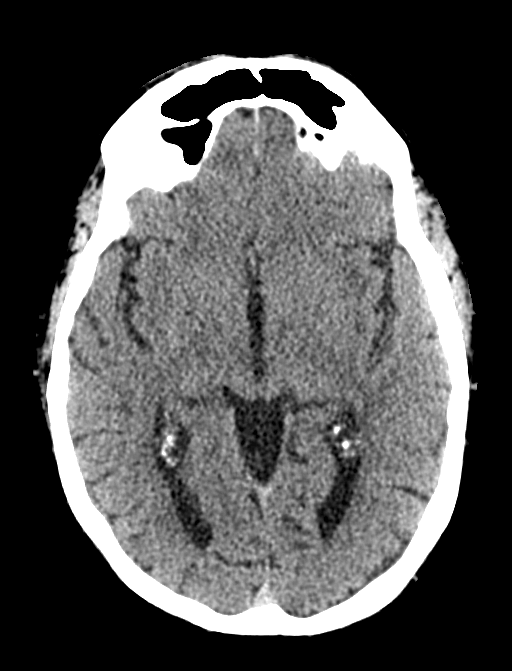
[im 35/63  brain]
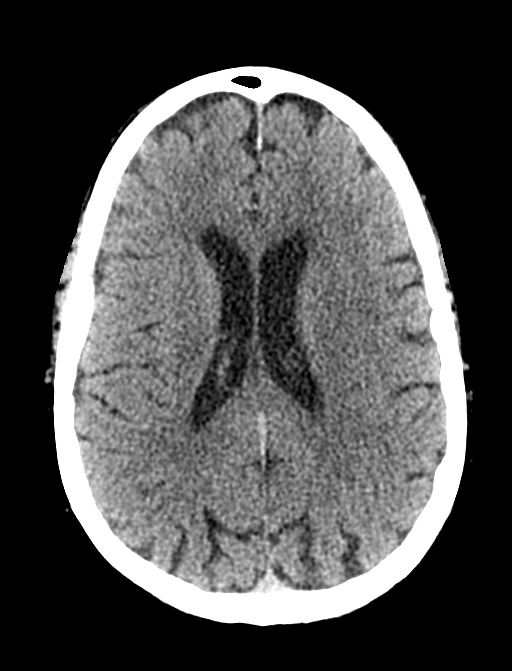
[im 35/63  bone]
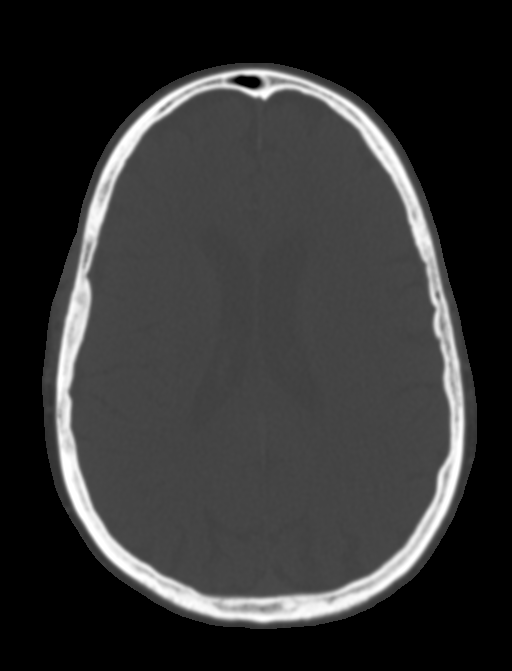
[im 42/63  brain]
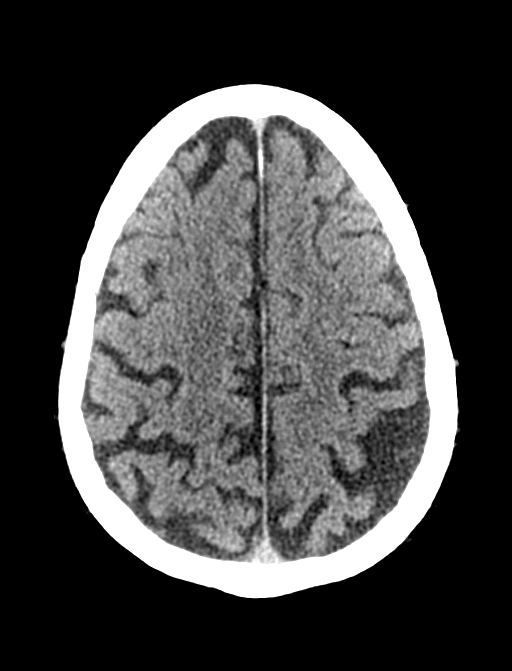
[im 49/63  brain]
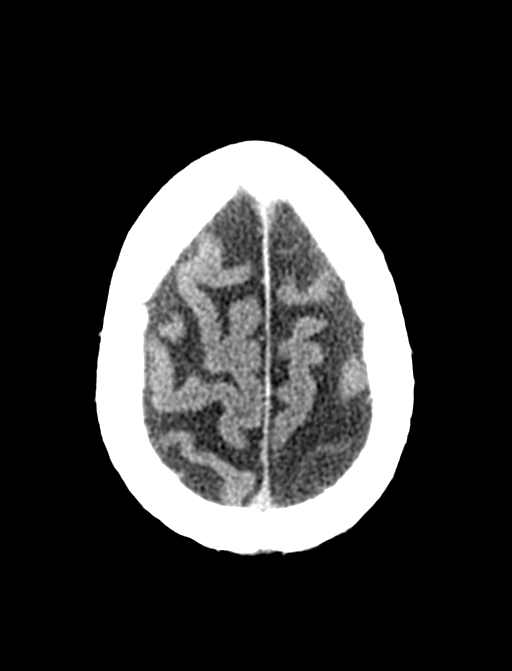
[im 56/63  brain]
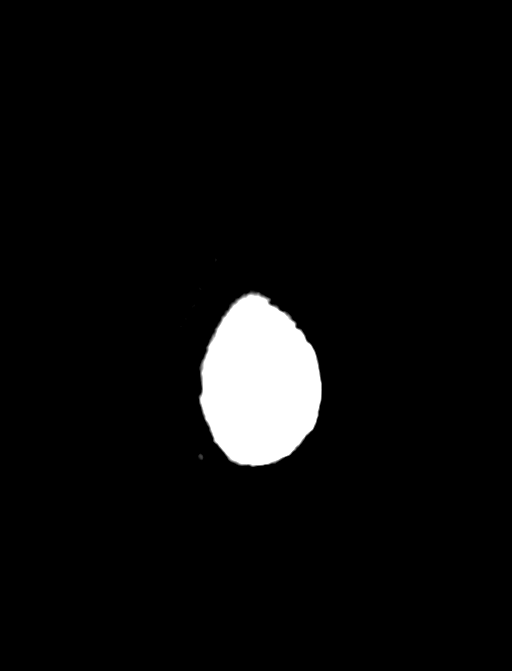

[14 of 47 positions shown; findings below may reference images not displayed]

FINDINGS: Brain: No acute intracranial hemorrhage, midline shift or mass
effect. No extra-axial fluid collection. Diffuse atrophy is noted.
Periventricular white matter hypodensities are seen bilaterally.
There is no hydrocephalus.

Vascular: No hyperdense vessel or unexpected calcification.

Skull: Normal. Negative for fracture or focal lesion.

Sinuses/Orbits: No acute finding.

Other: None.
IMPRESSION: 1. No acute intracranial process.
2. Mild atrophy with chronic microvascular ischemic changes.

## 2022-02-04 IMAGING — US US RENAL
1 series · 13 of 25 positions shown · non-contrast
Comparison: [DATE]

CLINICAL DATA: Acute renal insufficiency

EXAM:
RENAL / URINARY TRACT ULTRASOUND COMPLETE

[Series 1: us renal · 44 acquisitions, 13 frames shown]
[im 1/44]
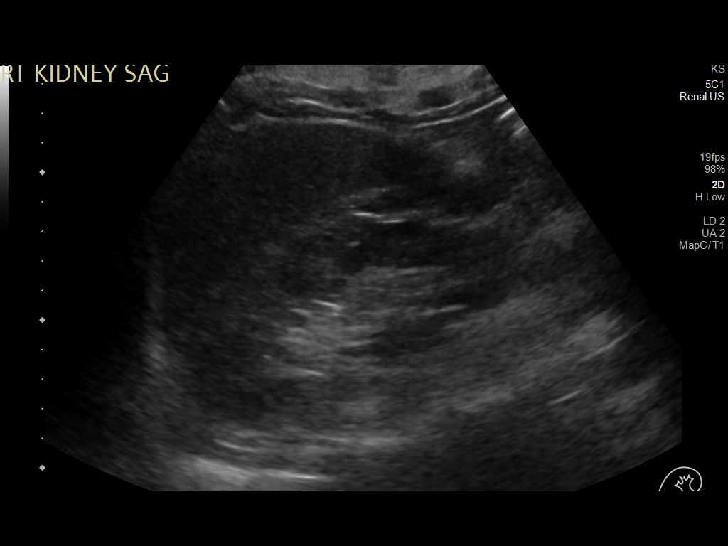
[im 4/44]
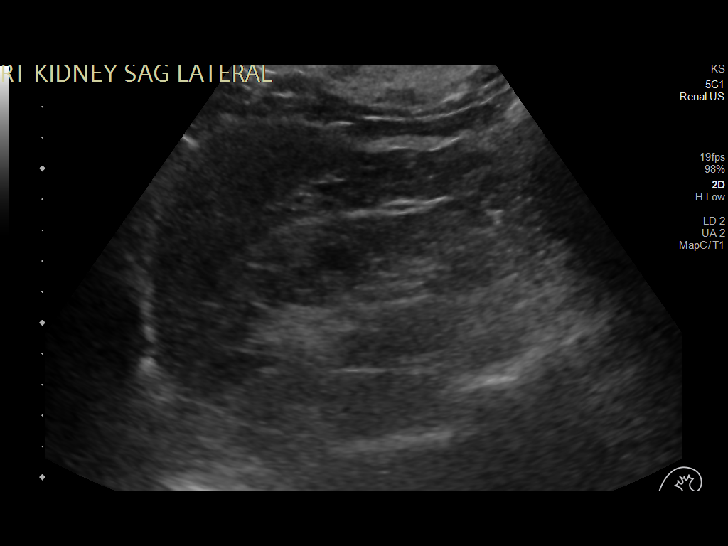
[im 8/44]
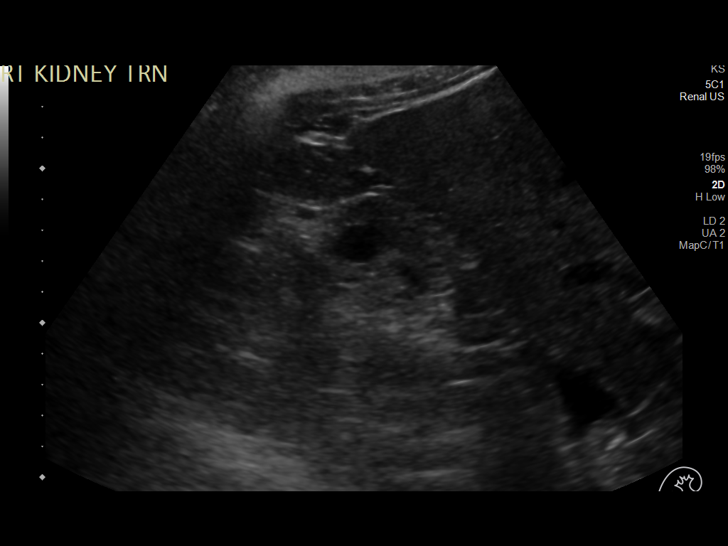
[im 11/44]
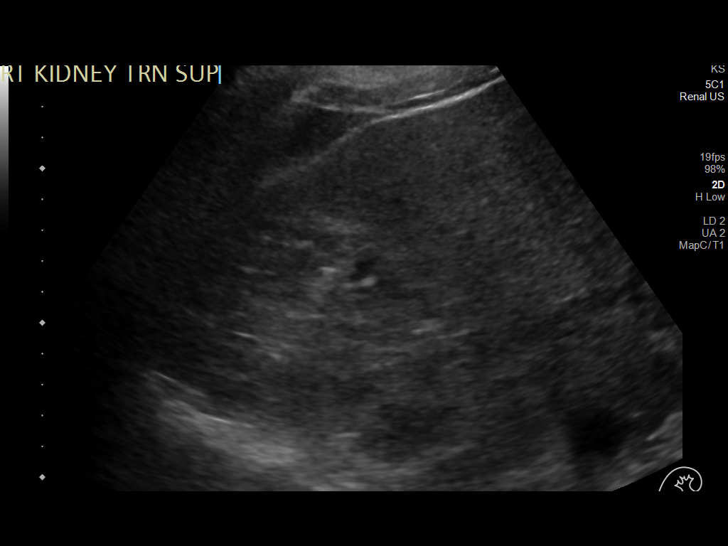
[im 15/44]
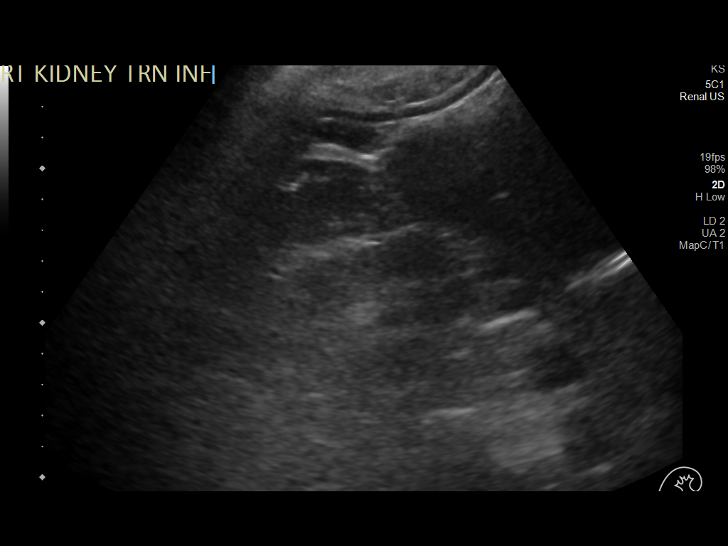
[im 18/44]
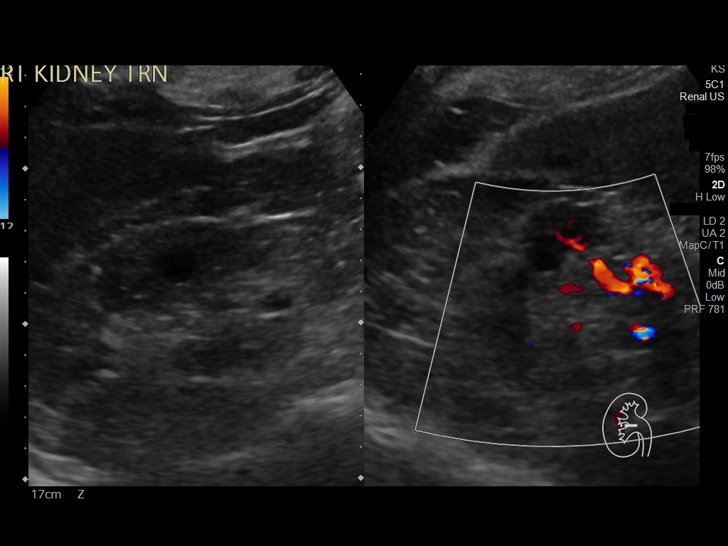
[im 22/44]
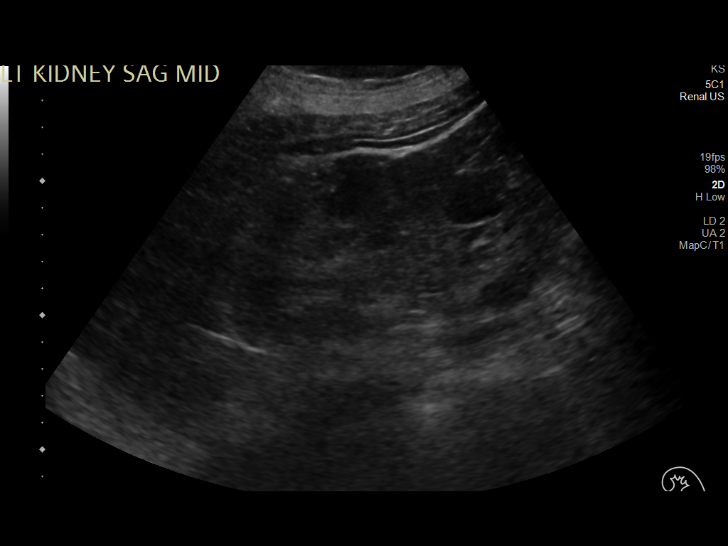
[im 26/44]
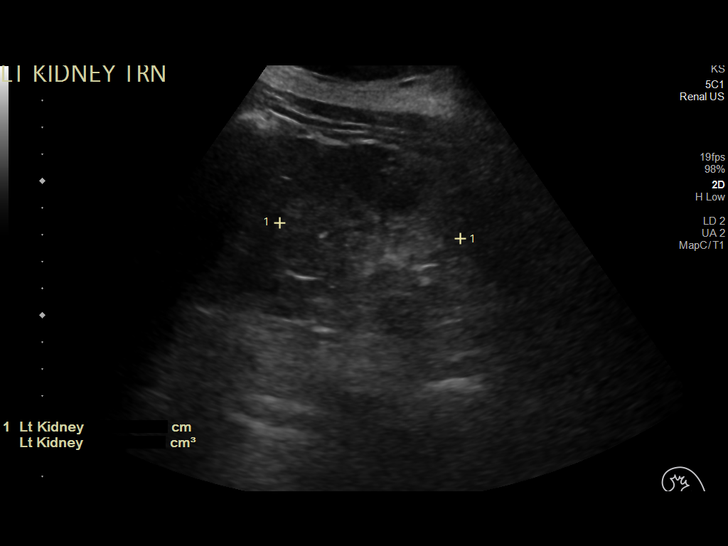
[im 29/44]
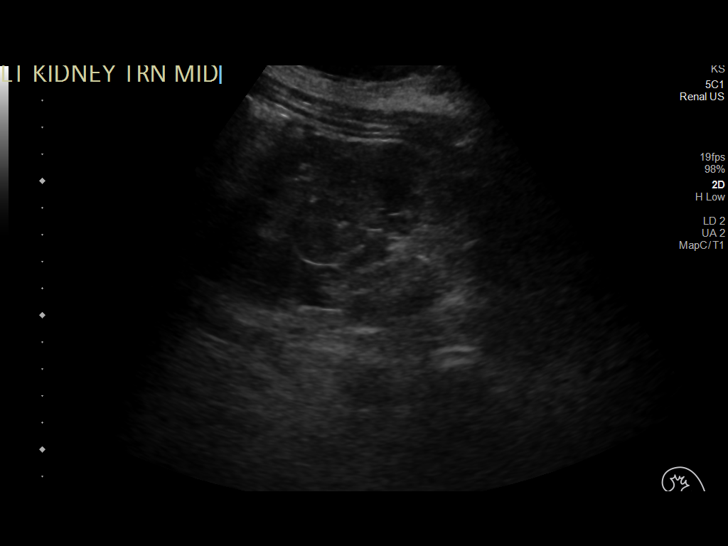
[im 33/44]
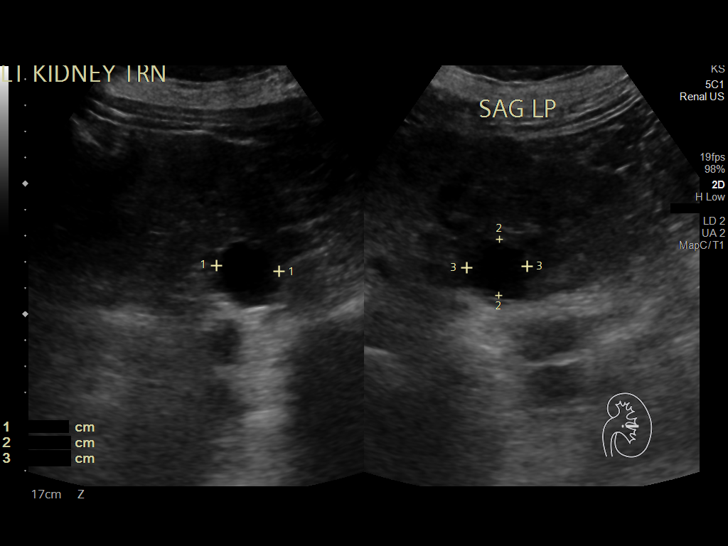
[im 36/44]
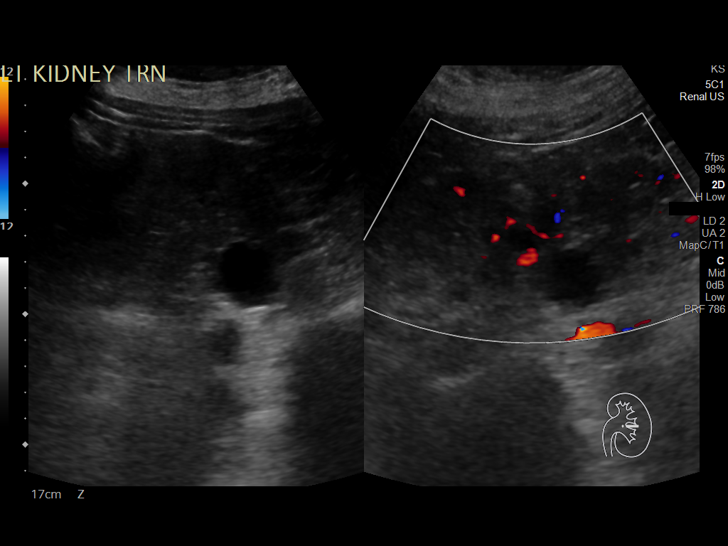
[im 40/44]
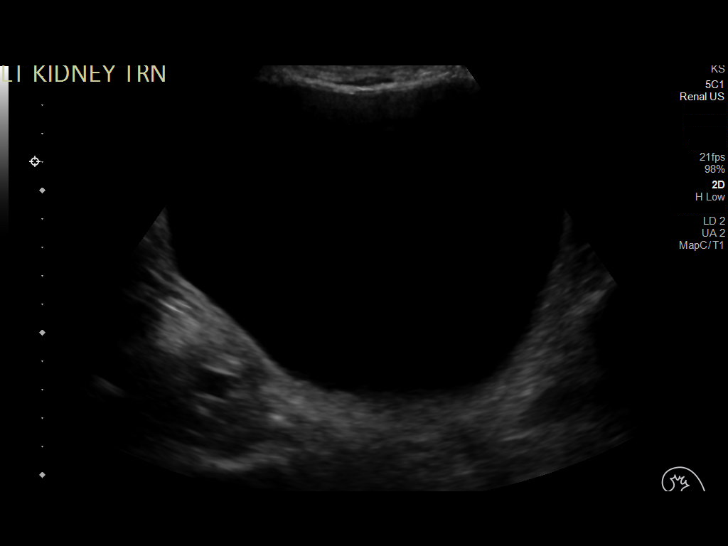
[im 44/44]
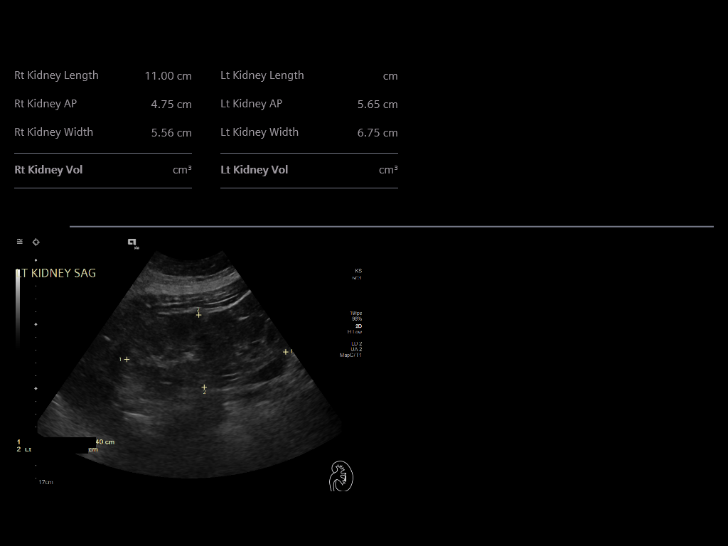

[13 of 25 positions shown; findings below may reference images not displayed]

FINDINGS: Right Kidney:

Renal measurements: 11.0 x 4.8 x 5.6 cm = volume: 152 mL. Renal
cortical echogenicity is diffusely increased suggesting changes of
underlying medical renal disease. Renal cortical thickness is
preserved. No hydronephrosis. No solid intrarenal masses or
calcifications are seen. 13 mm simple cortical cyst noted within the
interpolar region.

Left Kidney:

Renal measurements: 12.4 x 5.7 x 6.8 cm = volume: 250 mL. Renal
cortical echogenicity is diffusely mildly increased in keeping with
changes of underlying medical renal disease. Renal cortical
thickness is preserved. No hydronephrosis. No solid intrarenal
masses or calcifications. Multiple simple cortical cysts or scar
within the interpolar region and lower pole of the left kidney
measuring up to 23 mm within the lower pole

Bladder:

Appears normal for degree of bladder distention.

Other:

Prostate gland is mildly enlarged and indents the base of the
bladder.
IMPRESSION: Diffusely increased renal cortical echogenicity in keeping with
changes of underlying medical renal disease.

Multiple simple cortical cysts noted bilaterally. No further
follow-up is required.

Mild prostatic enlargement.

## 2022-02-04 IMAGING — MR MR MRA HEAD W/O CM
2 series · 16 of 48 positions shown · IV contrast (gadavist)
Comparison: Plain head CT [4K] hours today and earlier.

CLINICAL DATA: 65-year-old male left side numbness, dizziness and
abnormal gait.

EXAM:
MRI HEAD WITHOUT CONTRAST
MRA HEAD WITHOUT CONTRAST
MRA NECK WITHOUT AND WITH CONTRAST
TECHNIQUE: Multiplanar, multiecho pulse sequences of the brain and surrounding
structures were obtained without intravenous contrast. Angiographic
images of the Circle of Willis were obtained using MRA technique
without intravenous contrast. Angiographic images of the neck were
obtained using MRA technique without and with intravenous contrast.
Carotid stenosis measurements (when applicable) are obtained
utilizing NASCET criteria, using the distal internal carotid
diameter as the denominator.
CONTRAST:  10mL GADAVIST GADOBUTROL 1 MMOL/ML IV SOLN

[Series 8: TOF · axial · 0.6mm · 0.35mm/px · z∈[-72,+25]mm · 15 of 172 slices shown (1 of 2)]
[im 1/172]
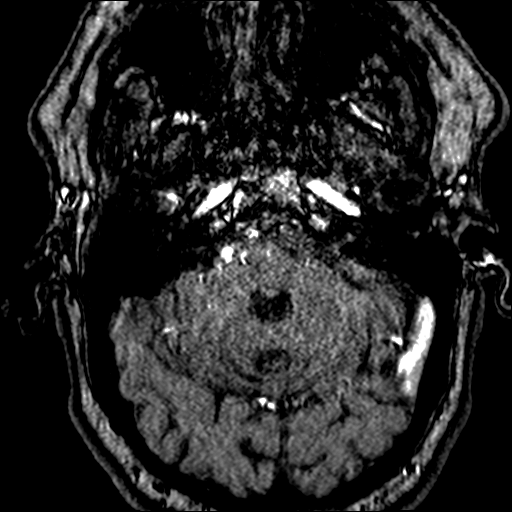
[im 4/172]
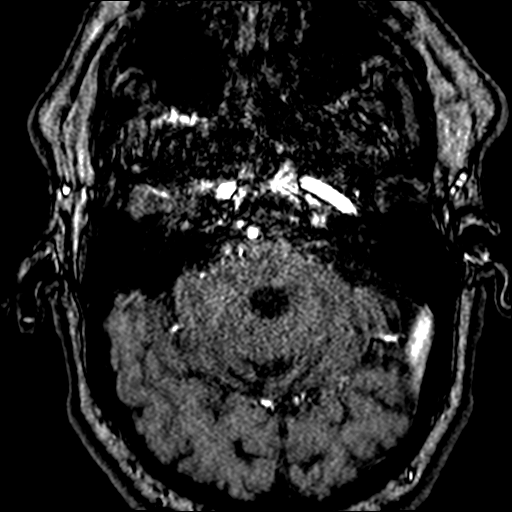
[im 8/172]
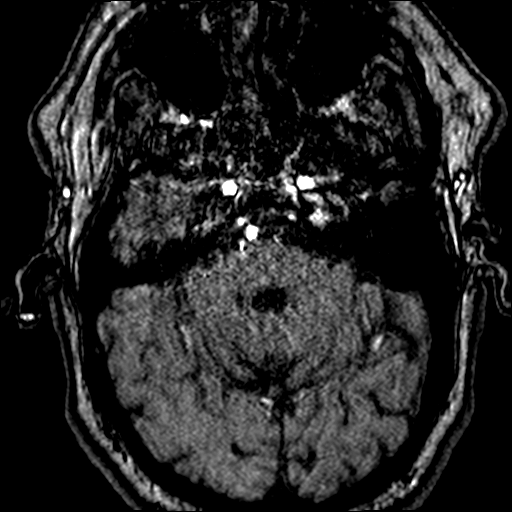
[im 12/172]
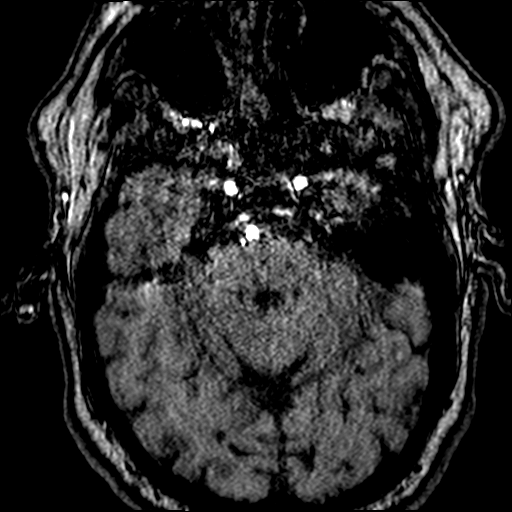
[im 15/172]
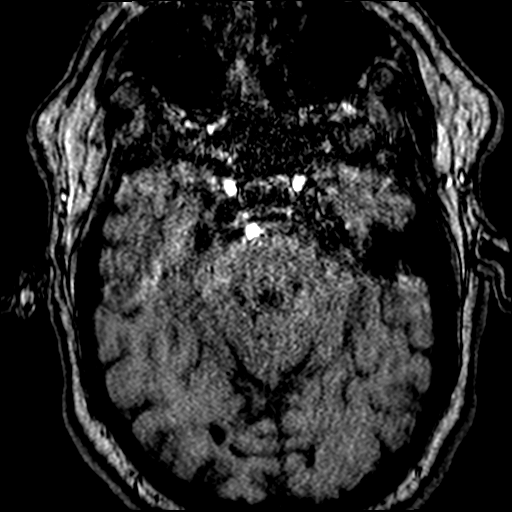
[im 27/172]
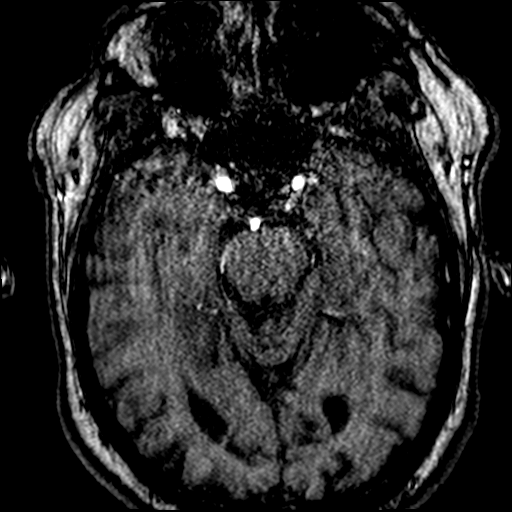
[im 30/172]
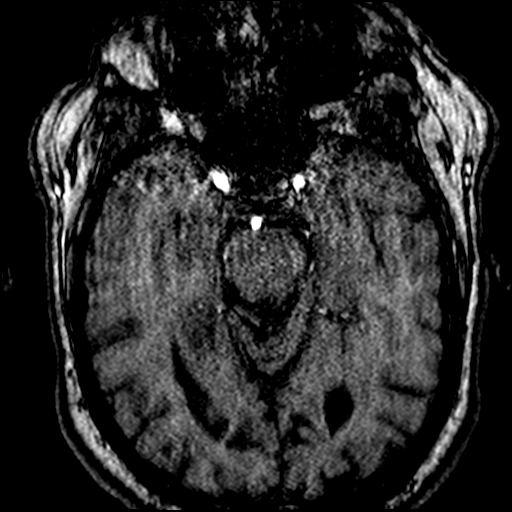
[im 53/172]
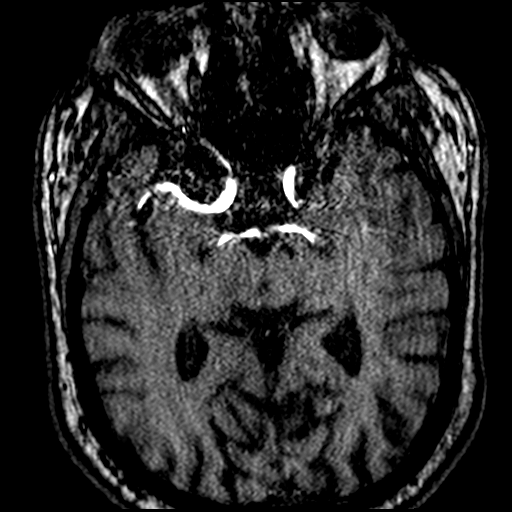
[im 75/172]
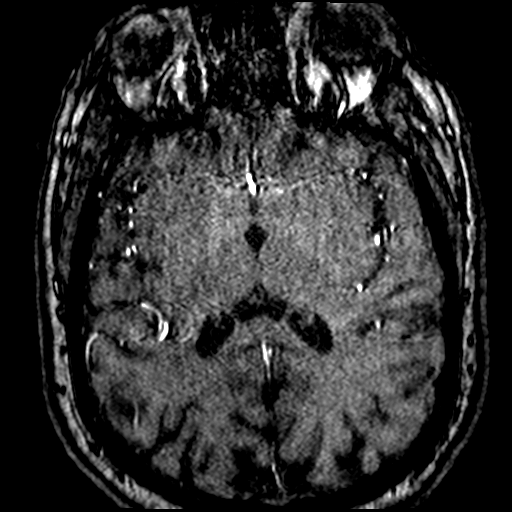
[im 86/172]
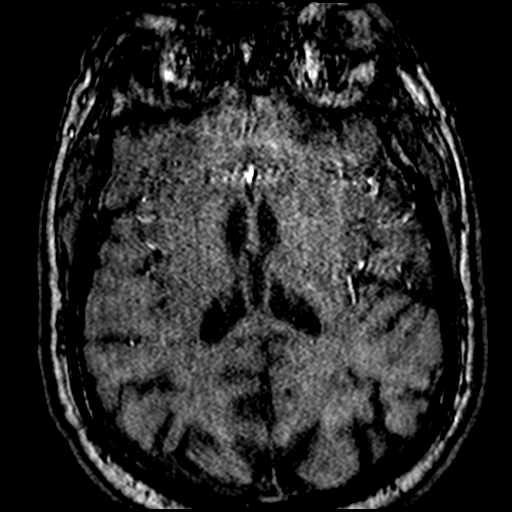
[im 97/172]
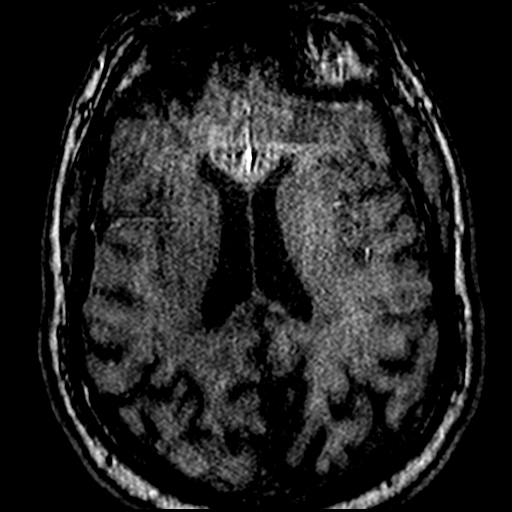
[im 119/172]
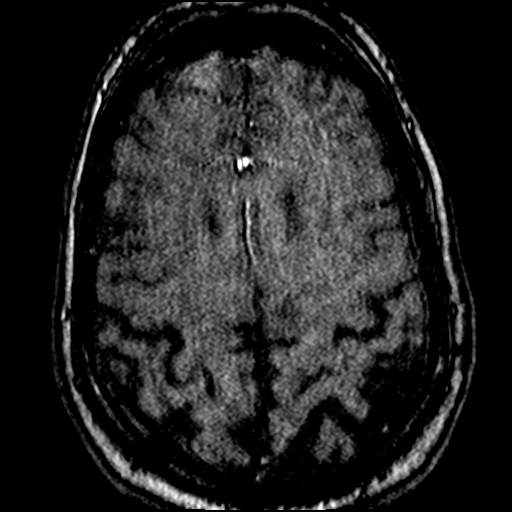
[im 142/172]
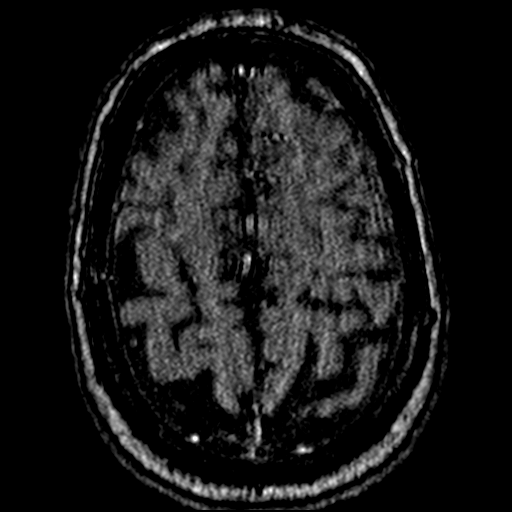
[im 145/172]
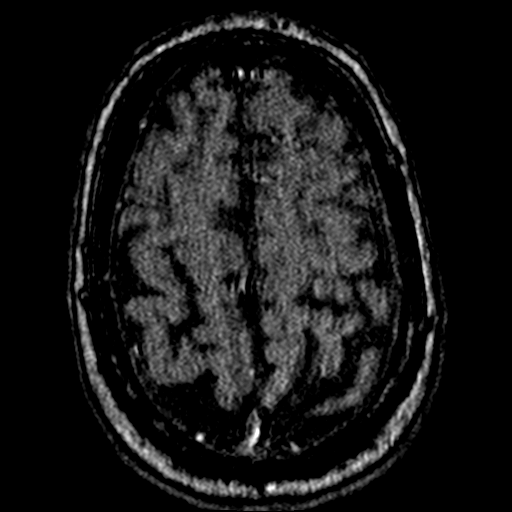
[im 164/172]
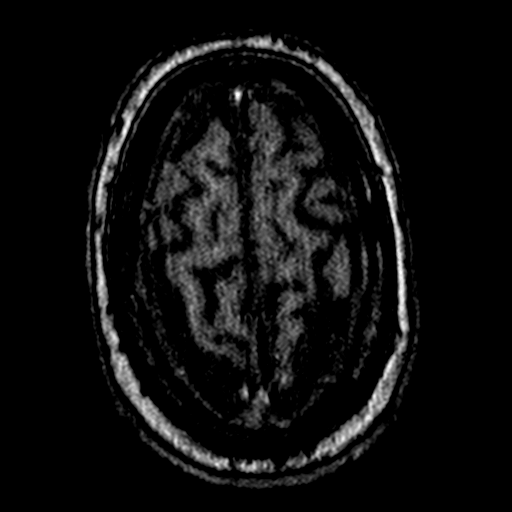

[Series 11: TOF · axial · 103.2mm · 0.35mm/px · 1 of 1 slices shown (2 of 2)]
[im 1/1]
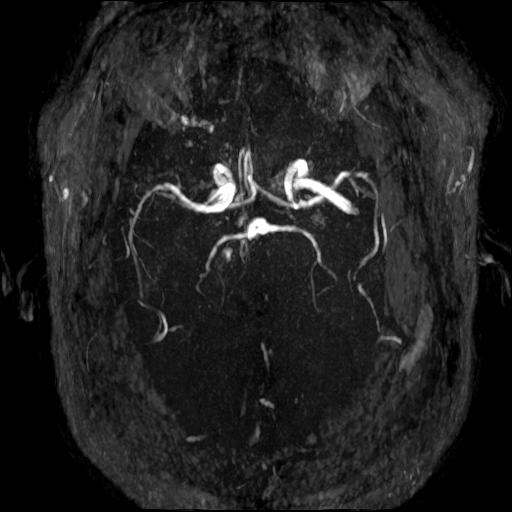

[16 of 48 positions shown; findings below may reference images not displayed]

FINDINGS: MRI HEAD FINDINGS

Brain: Cerebral volume is within normal limits for age. No midline
shift, mass effect, evidence of mass lesion, ventriculomegaly,
extra-axial collection or acute intracranial hemorrhage.
Cervicomedullary junction and pituitary are within normal limits.

Confluent small area of restricted diffusion in the right medullary
pyramid, tracking into the paracentral dorsal medulla on series 5,
image 57. Faint associated T2 and FLAIR hyperintensity. No acute
brainstem hemorrhage. No mass effect.

No other restricted diffusion. Small chronic infarct infarct in the
posterior right cerebellum also on series 8, image 6. And evidence
of a chronic hemorrhage in the left midbrain on series 9, image 29.

But otherwise largely normal for age gray and white matter signal in
the brain. Mild for age superior frontal lobe nonspecific white
matter T2 and FLAIR hyperintensity. No other chronic cerebral blood
products identified. No cerebral cortical encephalomalacia.

Vascular: Major intracranial vascular flow voids are preserved, the
distal left vertebral artery appears dominant, but see MRA findings
below.

Skull and upper cervical spine: Negative for age visible cervical
spine. Visualized bone marrow signal is within normal limits.

Sinuses/Orbits: Postoperative changes to both globes. Otherwise
negative orbits. Paranasal Visualized paranasal sinuses and mastoids
are clear.

Other: Visible internal auditory structures appear normal. Negative
visible scalp and face.

MRA NECK FINDINGS

Precontrast time-of-flight images reveal a 3 vessel arch
configuration with antegrade flow in the bilateral cervical carotid
and vertebral arteries throughout the neck. The left vertebral
artery appears dominant and the right somewhat diminutive
throughout. Both carotid bifurcations are patent.

Post-contrast neck MRA images. Proximal great vessels appear within
normal limits.

Right CCA is patent without stenosis, mild tortuosity. The right
carotid bifurcation there is mostly ECA origin irregularity (series
101, image 10). No cervical right ICA stenosis. Right ICA siphon and
terminus also appear within normal limits.

Mildly to moderately tortuous left CCA with no stenosis. Capacious
left carotid bifurcation. Cervical left ICA and also visible left
ICA siphon and terminus appear patent without stenosis.

No proximal subclavian artery stenosis identified, tortuous proximal
right subclavian artery. Normal left vertebral artery origin with
tortuous V1 segment. Dominant left vertebral artery without stenosis
to the basilar.

Diminutive right vertebral artery is patent and enhancing throughout
the neck with no convincing extracranial stenosis. However, the
distal right V4 segment appears beaded and irregular on series 104,
image 5

MRA HEAD FINDINGS

Intermittently degraded by mild motion artifact. And accidentally
the distal vertebral arteries and proximal basilar were not imaged.

Visible basilar artery is patent with mild irregularity. Basilar
tip, SCA and PCA origins are normal. Posterior communicating
arteries are diminutive or absent. Bilateral PCA branches are within
normal limits.

Visible ICA siphons are patent without stenosis. Patent carotid
termini, MCA and ACA origins. Codominant A1 segments. MCA M1
segments and bifurcations appear patent. But other MCA and ACA
branch detail is degraded by motion.
IMPRESSION: 1. Small acute infarct in the Right Medullary Pyramid (medullary
perforator artery territory, see #2). No associated acute hemorrhage
or mass effect.

2. Neck MRA demonstrates a non-dominant Right Vertebral Artery with
evidence of moderate to severe distal Right Vertebral (V4) segment
stenosis, concordant with #1. No large vessel occlusion.

3. Intracranial MRA is degraded.  No large vessel occlusion.

4. Small chronic infarct in the right cerebellum, and evidence of a
chronic micro-hemorrhage in the left midbrain. Mild for age
superimposed cerebral white matter signal changes, most commonly due
to small vessel disease.

## 2022-02-04 MED ORDER — L-CARNITINE 500 MG PO TABS: 500.0000 mg | ORAL_TABLET | Freq: Two times a day (BID) | ORAL | Status: DC

## 2022-02-04 MED ORDER — PRAMIPEXOLE DIHYDROCHLORIDE 0.25 MG PO TABS
0.5000 mg | ORAL_TABLET | Freq: Two times a day (BID) | ORAL | Status: DC
  Filled 2022-02-04: qty 2

## 2022-02-04 MED ORDER — CLOPIDOGREL BISULFATE 75 MG PO TABS
75.0000 mg | ORAL_TABLET | Freq: Once | ORAL | Status: AC
  Administered 2022-02-04: 75 mg via ORAL
  Filled 2022-02-04: qty 1

## 2022-02-04 MED ORDER — LEVOCARNITINE 1 GM/10ML PO SOLN
500.0000 mg | Freq: Two times a day (BID) | ORAL | Status: DC
  Filled 2022-02-04: qty 5

## 2022-02-04 MED ORDER — DULOXETINE HCL 30 MG PO CPEP: 120.0000 mg | ORAL_CAPSULE | Freq: Every day | ORAL | Status: DC

## 2022-02-04 MED ORDER — ACETAMINOPHEN 650 MG RE SUPP: 650.0000 mg | RECTAL | Status: DC | PRN

## 2022-02-04 MED ORDER — CLOPIDOGREL BISULFATE 75 MG PO TABS: 75.0000 mg | ORAL_TABLET | Freq: Every day | ORAL | 0 refills | Status: DC

## 2022-02-04 MED ORDER — GADOBUTROL 1 MMOL/ML IV SOLN
10.0000 mL | Freq: Once | INTRAVENOUS | Status: AC | PRN
  Administered 2022-02-04: 10 mL via INTRAVENOUS

## 2022-02-04 MED ORDER — ACETAMINOPHEN 325 MG PO TABS
650.0000 mg | ORAL_TABLET | ORAL | Status: DC | PRN
Start: 2022-02-04 — End: 2022-02-05

## 2022-02-04 MED ORDER — CLOPIDOGREL BISULFATE 75 MG PO TABS: 75.0000 mg | ORAL_TABLET | Freq: Every day | ORAL | Status: DC

## 2022-02-04 MED ORDER — STROKE: EARLY STAGES OF RECOVERY BOOK
Freq: Once | Status: DC
  Filled 2022-02-04: qty 1

## 2022-02-04 MED ORDER — FEBUXOSTAT 40 MG PO TABS
40.0000 mg | ORAL_TABLET | Freq: Every day | ORAL | Status: DC
  Filled 2022-02-04: qty 1

## 2022-02-04 MED ORDER — SODIUM CHLORIDE 0.9 % IV SOLN: Freq: Once | INTRAVENOUS | Status: AC

## 2022-02-04 MED ORDER — INSULIN ASPART 100 UNIT/ML IJ SOLN: 0.0000 [IU] | Freq: Every day | INTRAMUSCULAR | Status: DC

## 2022-02-04 MED ORDER — INSULIN ASPART 100 UNIT/ML IJ SOLN
0.0000 [IU] | Freq: Three times a day (TID) | INTRAMUSCULAR | Status: DC
  Administered 2022-02-05: 2 [IU] via SUBCUTANEOUS

## 2022-02-04 MED ORDER — DIVALPROEX SODIUM ER 500 MG PO TB24
1000.0000 mg | ORAL_TABLET | Freq: Every day | ORAL | Status: DC
Start: 2022-02-04 — End: 2022-02-04

## 2022-02-04 MED ORDER — ACETAMINOPHEN 325 MG PO TABS: 650.0000 mg | ORAL_TABLET | ORAL | Status: DC | PRN

## 2022-02-04 MED ORDER — ROSUVASTATIN CALCIUM 20 MG PO TABS
20.0000 mg | ORAL_TABLET | Freq: Every day | ORAL | Status: DC
Start: 2022-02-05 — End: 2022-02-05
  Administered 2022-02-05: 20 mg via ORAL
  Filled 2022-02-04: qty 1

## 2022-02-04 MED ORDER — SODIUM CHLORIDE 0.9 % IV SOLN: INTRAVENOUS | Status: DC

## 2022-02-04 MED ORDER — ACETAMINOPHEN 160 MG/5ML PO SOLN: 650.0000 mg | ORAL | Status: DC | PRN

## 2022-02-04 MED ORDER — HYDRALAZINE HCL 20 MG/ML IJ SOLN: 10.0000 mg | INTRAMUSCULAR | Status: DC | PRN

## 2022-02-04 MED ORDER — LAMOTRIGINE 100 MG PO TABS: 200.0000 mg | ORAL_TABLET | Freq: Every morning | ORAL | Status: DC

## 2022-02-04 MED ORDER — STROKE: EARLY STAGES OF RECOVERY BOOK
Freq: Once | Status: AC
  Filled 2022-02-04: qty 1

## 2022-02-04 MED ORDER — VITAMIN D 25 MCG (1000 UNIT) PO TABS: 1000.0000 [IU] | ORAL_TABLET | Freq: Every day | ORAL | Status: DC

## 2022-02-04 MED ORDER — ENOXAPARIN SODIUM 40 MG/0.4ML IJ SOSY: 40.0000 mg | PREFILLED_SYRINGE | INTRAMUSCULAR | Status: DC

## 2022-02-04 MED ORDER — CLOPIDOGREL BISULFATE 75 MG PO TABS
75.0000 mg | ORAL_TABLET | Freq: Every day | ORAL | Status: DC
Start: 2022-02-05 — End: 2022-02-05
  Administered 2022-02-05: 75 mg via ORAL
  Filled 2022-02-04: qty 1

## 2022-02-04 MED ORDER — INSULIN ASPART 100 UNIT/ML IJ SOLN
0.0000 [IU] | Freq: Three times a day (TID) | INTRAMUSCULAR | Status: DC
  Filled 2022-02-04: qty 0.09

## 2022-02-04 MED ORDER — PROSIGHT PO TABS
1.0000 | ORAL_TABLET | Freq: Every day | ORAL | Status: DC
  Filled 2022-02-04: qty 1

## 2022-02-04 NOTE — Consult Note (Signed)
Neurology Consultation ? ?Reason for Consult: Left-sided sensory deficit, concern for stroke ?Referring Physician: Dr. Eulis Foster ? ?CC: Left-sided sensory deficit ? ?History is obtained from: Patient, Chart review ? ?HPI: Edwin [name] is a 66 y.o. male with a medical history significant for bipolar disorder, essential hypertension, CKD stage II, obesity with a BMI of 33.47 kg/m? who presented to [facility] on 3/19 for evaluation of left-sided sensory deficits and difficulty with ambulation. The patient states that on Wednesday, March 15 he had a sudden onset of nausea and vertigo with some improvement throughout the day but without complete resolution. On Thursday, the patient was noticing that he was having trouble with his ambulation with a leftward lean and he tripped, fell, and hit his head on the bathtub. On Friday the patient went to his PCP for CT evaluation of his head and while in his PCP office, the patient noted left-sided tingling involving his face, arm, and leg. Due to persistent sensory deficits and gait difficulty, he presented to the ED for further evaluation. Neurology was consulted by EDP due to concern for acute stroke.  ? ?LKW: 01/31/2022 ?TNK given?: no, patient presented well outside of the thrombolytic therapy time window ?IR Thrombectomy? No, presentation is not consistent with an LVO, imaging reviewed without evidence of LVO.  ?Modified Rankin Scale: 0-Completely asymptomatic and back to baseline post- stroke ? ?ROS: A complete ROS was performed and is negative except as noted in the HPI.  ? ?Past Medical History:  ?Diagnosis Date  ? Allergic rhinitis   ? Bipolar disorder ([place])   ? Bradycardia 2012  ?  due to lithium  ? CKD (chronic kidney disease) stage 2, GFR 60-89 ml/min   ? Gout   ? Hypertension   ? Mild renal insufficiency   ? , with creatinine of 1.3 in 2010, was side effect of med  ? Obesity   ? ,moderate  ? Prostatitis   ? , Episodic  ? Suicide attempt ([place]) 09/20/2014  ? ?Past Surgical  History:  ?Procedure Laterality Date  ? APPENDECTOMY    ? CATARACT EXTRACTION Right   ? COLONOSCOPY WITH PROPOFOL N/A 06/14/2014  ? Procedure: COLONOSCOPY WITH PROPOFOL;  Surgeon: Garlan Fair, MD;  Location: [facility];  Service: Endoscopy;  Laterality: N/A;  ? TONSILLECTOMY    ? UMBILICAL HERNIA REPAIR    ? ?Family History  ?Problem Relation Age of Onset  ? Hypertension Father   ? Gout Father   ? Alzheimer's disease Father   ? Other Mother   ?     Viral Meningitis  ? Mitral valve prolapse Mother   ? Arrhythmia Mother   ? Hypothyroidism Mother   ? ?Social History:  ? reports that he has never smoked. He has never used smokeless tobacco. He reports that he does not drink alcohol and does not use drugs. ? ?Medications ?No current facility-administered medications for this encounter. ? ?Current Outpatient Medications:  ?  Cholecalciferol 1000 units TBDP, Take 1 tablet by mouth daily. , Disp: , Rfl:  ?  dapagliflozin propanediol (FARXIGA) 10 MG TABS tablet, Take 10 mg by mouth daily., Disp: , Rfl:  ?  diclofenac Sodium (VOLTAREN) 1 % GEL, Apply 2 g topically daily as needed (for knee pain)., Disp: , Rfl:  ?  divalproex (DEPAKOTE ER) 500 MG 24 hr tablet, Take 2 tablets (1,000 mg total) by mouth at bedtime., Disp: 180 tablet, Rfl: 1 ?  DULoxetine (CYMBALTA) 60 MG capsule, Take 2 capsules (120 mg total) by  mouth daily., Disp: 180 capsule, Rfl: 1 ?  febuxostat (ULORIC) 40 MG tablet, Take 40 mg by mouth daily., Disp: , Rfl:  ?  furosemide (LASIX) 20 MG tablet, Take 20 mg by mouth daily as needed for edema. May take 2 to 3 days weekly, Disp: , Rfl:  ?  hydrALAZINE (APRESOLINE) 50 MG tablet, Take 50 mg by mouth 3 (three) times daily., Disp: , Rfl:  ?  lamoTRIgine (LAMICTAL) 200 MG tablet, Take 1 tablet (200 mg total) by mouth every morning., Disp: 90 tablet, Rfl: 1 ?  LevOCARNitine (L-CARNITINE) 500 MG TABS, Take 500 mg by mouth 2 (two) times daily., Disp: , Rfl:  ?  losartan (COZAAR) 50 MG tablet, Take 1 tablet (50 mg  total) by mouth daily. (Patient taking differently: Take 100 mg by mouth daily.), Disp: 30 tablet, Rfl: 0 ?  metFORMIN (GLUCOPHAGE-XR) 500 MG 24 hr tablet, Take 500 mg by mouth 2 (two) times daily., Disp: , Rfl:  ?  metoprolol tartrate (LOPRESSOR) 25 MG tablet, Take 25 mg by mouth 2 (two) times daily., Disp: , Rfl:  ?  multivitamin-lutein (OCUVITE-LUTEIN) CAPS capsule, Take 1 capsule by mouth daily., Disp: , Rfl:  ?  pramipexole (MIRAPEX) 0.5 MG tablet, Take 1 tablet (0.5 mg total) by mouth 2 (two) times daily., Disp: 180 tablet, Rfl: 1 ? ?Exam: ?Current vital signs: ?BP (!) 156/87   Pulse (!) 54   Temp 97.8 ?F (36.6 ?C) (Oral)   Resp 17   Ht 5\' 11"  (1.803 m)   Wt 108.9 kg   SpO2 94%   BMI 33.47 kg/m?  ?Vital signs in last 24 hours: ?Temp:  [97.8 ?F (36.6 ?C)] 97.8 ?F (36.6 ?C) (03/19 0043) ?Pulse Rate:  [53-74] 54 (03/19 0700) ?Resp:  [11-22] 17 (03/19 0700) ?BP: (142-183)/(53-146) 156/87 (03/19 0700) ?SpO2:  [94 %-100 %] 94 % (03/19 0700) ?Weight:  [108.9 kg] 108.9 kg (03/19 0043) ? ?GENERAL: Awake, alert, in no acute distress ?Psych: Affect appropriate for situation, patient is calm and cooperative with examination ?Head: Normocephalic and atraumatic, without obvious abnormality ?EENT: Normal conjunctivae, dry mucous membranes, no OP obstruction ?LUNGS: Normal respiratory effort. Non-labored breathing on room air ?CV: Regular rate and rhythm on telemetry ?ABDOMEN: Soft, non-tender, non-distended ?Extremities: Warm, well perfused, with bilateral nonpitting edema of lower extremities  ? ?NEURO:  ?Mental Status: Awake, alert, and oriented to person, place, time, and situation. ?He is able to provide a clear and coherent history of present illness. ?Speech/Language: speech is fluent without dysarthria.    ?Naming, repetition, fluency, and comprehension intact without aphasia. ?No neglect is noted ?Cranial Nerves:  ?II: PERRL. Visual fields full.  ?III, IV, VI: EOMI without gaze preference, nystagmus, or  diplopia  ?V: Decreased and tingling sensation reported to the left face compared to the right  ?VII: Face is symmetric resting and smiling.  ?VIII: Hearing is intact to voice ?IX, X: Palate elevation is symmetric. Phonation normal.  ?XI: Normal sternocleidomastoid and trapezius muscle strength ?XII: Tongue protrudes midline without fasciculations.   ?Motor: 5/5 strength is all muscle groups without vertical drift.  ?Tone is normal. Bulk is normal.  ?Sensation: Decreased sensation to light touch with tingling sensation reported to the left upper and lower extremities with light touch. No extinction to DSS present.  ?Coordination: FTN intact bilaterally. HKS intact bilaterally with minimal left lower extremity ataxia present. No pronator drift. ?Gait: Deferred for patient safety with recent fall and reports of leaning leftward with walking  ? ?NIHSS: ?1a Level  of Conscious.: 0 ?1b LOC Questions: 0 ?1c LOC Commands: 0 ?2 Best Gaze: 0 ?3 Visual: 0 ?4 Facial Palsy: 0 ?5a Motor Arm - left: 0 ?5b Motor Arm - Right: 0 ?6a Motor Leg - Left: 0 ?6b Motor Leg - Right: 0 ?7 Limb Ataxia: 1 (mild left lower extremity ataxia with HKS) ?8 Sensory: 1 decreased sensation with tingling of the left arm, leg, and face ?9 Best Language: 0 ?10 Dysarthria: 0 ?11 Extinct. and Inatten.: 0 ?TOTAL: 2 ? ?Labs ?I have reviewed labs in epic and the results pertinent to this consultation are: ?CBC ?   ?Component Value Date/Time  ? WBC 10.1 02/04/2022 0129  ? RBC 5.07 02/04/2022 0129  ? HGB 15.3 02/04/2022 0155  ? HCT 45.0 02/04/2022 0155  ? PLT 251 02/04/2022 0129  ? MCV 92.1 02/04/2022 0129  ? MCH 31.2 02/04/2022 0129  ? MCHC 33.8 02/04/2022 0129  ? RDW 12.8 02/04/2022 0129  ? LYMPHSABS 2.2 02/04/2022 0129  ? MONOABS 0.8 02/04/2022 0129  ? EOSABS 0.2 02/04/2022 0129  ? BASOSABS 0.1 02/04/2022 0129  ? ?CMP ?   ?Component Value Date/Time  ? NA 140 02/04/2022 0155  ? K 4.4 02/04/2022 0155  ? CL 105 02/04/2022 0155  ? CO2 23 02/04/2022 0129  ?  GLUCOSE 120 (H) 02/04/2022 0155  ? BUN 41 (H) 02/04/2022 0155  ? CREATININE 1.90 (H) 02/04/2022 0155  ? CALCIUM 9.3 02/04/2022 0129  ? PROT 7.4 02/04/2022 0129  ? ALBUMIN 4.3 02/04/2022 0129  ? AST 23 02/04/2022 0129  ? ALT

## 2022-02-04 NOTE — ED Provider Notes (Signed)
7:05 AM-checkout from Dr. Ayesha Rumpf to evaluate patient after MRI imaging to evaluate left-sided numbness with weakness.  Patient has initially declined admission, but now states he will wait for an MRI. ? ? ?10:25 AM-MRI brain done, indicating right-sided infarct, small.  MRI done, does not indicate acute large vessel occlusion.  At this time he is agreeable to hospitalization.  Wife with patient also agreeable and findings discussed with them.  We will contact neuro hospitalist for consultation and medical hospitalist for admission. ? ?Medical decision making-acute right brain CVA, medical screening evaluation consistent with mild AKI with creatinine 1.7.  Patient requires hospitalization for management of acute stroke with risk factor stratification and comprehensive evaluation. ? ? ?10:58 AM-Case discussed with neuro hospitalist who recommends fast-track for possible discharge today.  He has ordered cardiac echo, and pulmonary evaluations for restratification including PT consultation.  We will contact hospitalist to admit.  Patient will require transfer to [facility] if he has to stay overnight, but if the stratification can be done prior to transfer, he could be discharged from this facility. ? ?  ?Daleen Bo, MD ?02/04/22 1139 ? ?

## 2022-02-04 NOTE — ED Triage Notes (Signed)
Pt reports L sided weakness especially in his L hand and L. Reports that he has been diagnosed with vertigo because of some dizziness over the last few weeks. He had a CT scan at his PCP's 2 days ago. Numbness was mild yesterday and worse today. No facial droop A&Ox4.  ?

## 2022-02-04 NOTE — ED Notes (Signed)
Patient transported to MRI 

## 2022-02-04 NOTE — Discharge Instructions (Addendum)
You did not receive a full work up in the Emergency Department today.  The Neurologist recommended you have an MRI to further evaluate your symptoms.  Get rechecked immediately if you have new or concerning symptoms.   ?

## 2022-02-04 NOTE — ED Notes (Signed)
[facility] contacted 

## 2022-02-04 NOTE — H&P (Signed)
?History and Physical  ? ? ?Patient: ALEXYS ORRIS [id number] DOB: 07/12/1956 ?DOA: 02/04/2022 ?DOS: the patient was seen and examined on 02/04/2022 ?PCP: Josetta Huddle, MD  ?Patient coming from: Home ? ?Chief Complaint:  ?Chief Complaint  ?Patient presents with  ? Numbness  ? ?HPI: ILAI WARK is a 66 y.o. male with medical history significant of DM2, HTN, hypogonadism, bipolar d/o, gout. Presenting with left side numbness. He reports 4 days ago when he woke up, he thought he was having an episode of vertigo. He was dizzy and nauseous. He vomited once. He decided to take the rest of the day off to rest. The next day he was feeling better overall, but not 100%. He had a fall that day where he hit his head. He didn't have any LOS. He saw his PCP and was given meclizine. The next day day he was for the most part ok until he went to sleep. That night prior to sleep, he felt some tingling on his left side. When he woke up yesterday, the tingling had intensified. It went back and forth between tingling and numbness throughout the day. His unsteadiness seemed worse as he was having difficulty walking. When his symptoms did not resolve last night, he decided to come to the ED for help. He denies any other aggravating or alleviating factors.   ? ?Review of Systems: As mentioned in the history of present illness. All other systems reviewed and are negative. ?Past Medical History:  ?Diagnosis Date  ? Allergic rhinitis   ? Bipolar disorder ([place])   ? Bradycardia 2012  ?  due to lithium  ? CKD (chronic kidney disease) stage 2, GFR 60-89 ml/min   ? Gout   ? Hypertension   ? Mild renal insufficiency   ? , with creatinine of 1.3 in 2010, was side effect of med  ? Obesity   ? ,moderate  ? Prostatitis   ? , Episodic  ? Suicide attempt ([place]) 09/20/2014  ? ?Past Surgical History:  ?Procedure Laterality Date  ? APPENDECTOMY    ? CATARACT EXTRACTION Right   ? COLONOSCOPY WITH PROPOFOL N/A 06/14/2014  ? Procedure: COLONOSCOPY WITH  PROPOFOL;  Surgeon: Garlan Fair, MD;  Location: [facility];  Service: Endoscopy;  Laterality: N/A;  ? TONSILLECTOMY    ? UMBILICAL HERNIA REPAIR    ? ?Social History:  reports that he has never smoked. He has never used smokeless tobacco. He reports that he does not drink alcohol and does not use drugs. ? ?Allergies  ?Allergen Reactions  ? Allopurinol Other (See Comments)  ?  Made pt emotionally unstable   ? Penicillins Hives and Other (See Comments)  ?  Has patient had a PCN reaction causing immediate rash, facial/tongue/throat swelling, SOB or lightheadedness with hypotension: No ?Has patient had a PCN reaction causing severe rash involving mucus membranes or skin necrosis: No ?Has patient had a PCN reaction that required hospitalization No ?Has patient had a PCN reaction occurring within the last 10 years: No ?If all of the above answers are "NO", then may proceed with Cephalosporin use.  ? Carrington Clamp [Finerenone] Other (See Comments)  ?  hyperkalemia  ? Aspirin Hives  ? Ciprofloxacin Rash  ? ? ?Family History  ?Problem Relation Age of Onset  ? Hypertension Father   ? Gout Father   ? Alzheimer's disease Father   ? Other Mother   ?     Viral Meningitis  ? Mitral valve prolapse Mother   ?  Arrhythmia Mother   ? Hypothyroidism Mother   ? ? ?Prior to Admission medications   ?Medication Sig Start Date End Date Taking? Authorizing Provider  ?Cholecalciferol 1000 units TBDP Take 1 tablet by mouth daily.    Yes [provider]  ?dapagliflozin propanediol (FARXIGA) 10 MG TABS tablet Take 10 mg by mouth daily.   Yes [provider]  ?diclofenac Sodium (VOLTAREN) 1 % GEL Apply 2 g topically daily as needed (for knee pain).   Yes [provider]  ?divalproex (DEPAKOTE ER) 500 MG 24 hr tablet Take 2 tablets (1,000 mg total) by mouth at bedtime. 01/01/22  Yes Cottle, Billey Co., MD  ?DULoxetine (CYMBALTA) 60 MG capsule Take 2 capsules (120 mg total) by mouth daily. 01/01/22  Yes Cottle, Billey Co., MD  ?febuxostat (ULORIC) 40 MG tablet Take 40 mg by mouth daily.   Yes [provider]  ?furosemide (LASIX) 20 MG tablet Take 20 mg by mouth daily as needed for edema. May take 2 to 3 days weekly 07/21/19  Yes [provider]  ?hydrALAZINE (APRESOLINE) 50 MG tablet Take 50 mg by mouth 3 (three) times daily.   Yes [provider]  ?lamoTRIgine (LAMICTAL) 200 MG tablet Take 1 tablet (200 mg total) by mouth every morning. 01/01/22  Yes Cottle, Billey Co., MD  ?LevOCARNitine (L-CARNITINE) 500 MG TABS Take 500 mg by mouth 2 (two) times daily.   Yes [provider]  ?losartan (COZAAR) 50 MG tablet Take 1 tablet (50 mg total) by mouth daily. ?Patient taking differently: Take 100 mg by mouth daily. 09/23/14  Yes Kerrie Buffalo, NP  ?metFORMIN (GLUCOPHAGE-XR) 500 MG 24 hr tablet Take 500 mg by mouth 2 (two) times daily. 01/07/22  Yes [provider]  ?metoprolol tartrate (LOPRESSOR) 25 MG tablet Take 25 mg by mouth 2 (two) times daily. 12/11/21  Yes [provider]  ?multivitamin-lutein (OCUVITE-LUTEIN) CAPS capsule Take 1 capsule by mouth daily.   Yes [provider]  ?pramipexole (MIRAPEX) 0.5 MG tablet Take 1 tablet (0.5 mg total) by mouth 2 (two) times daily. 01/01/22  Yes Cottle, Billey Co., MD  ? ? ?Physical Exam: ?Vitals:  ? 02/04/22 0530 02/04/22 0630 02/04/22 0700 02/04/22 1003  ?BP: (!) 177/89 (!) 174/74 (!) 156/87 (!) 144/94  ?Pulse:   (!) 54 62  ?Resp: 17 15 17 18   ?Temp:      ?TempSrc:      ?SpO2:   94% 94%  ?Weight:      ?Height:      ? ?General: 67 y.o. male resting in bed in NAD ?Eyes: PERRL, normal sclera ?ENMT: Nares patent w/o discharge, orophaynx clear, dentition normal, ears w/o discharge/lesions/ulcers ?Neck: Supple, trachea midline ?Cardiovascular: RRR, +S1, S2, no m/g/r, equal pulses throughout ?Respiratory: CTABL, no w/r/r, normal WOB ?GI: BS+, NDNT, no masses noted, no organomegaly noted ?MSK: No e/c/c ?Neuro: A&O x 3, decreased LUE  sensation ?Psyc: Appropriate interaction and affect, calm/cooperative ? ?Data Reviewed: ? ?Glucose  120 ?BUN  41 ?SCr  1.90 ? ?MRI brain/MRA H&N: 1. Small acute infarct in the Right Medullary Pyramid (medullary perforator artery territory, see #2). No associated acute hemorrhage or mass effect. ?2. 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 No large vessel occlusion. ?3. Intracranial MRA is degraded.  No large vessel occlusion. ?4. Small chronic infarct in the right cerebellum, and evidence of a chronic  micro-hemorrhage in the left midbrain. Mild for age ?superimposed cerebral white matter signal changes, most commonly due to small vessel disease. ? ?Assessment and Plan: ?No notes have been filed under this hospital service. ?Service: Hospitalist ?CVA ?    - admit to inpt, tele @ [place] ?    - MRI Brain/MRA H&N as above ?    - permissive HTN ?    - PT/OT/TOC ?    - lipid panel/A1c ?    - echo ?    - neurology onboard, appreciate assistance ? ?Bipolar d/o ?    - continue home regimen ? ?Hypogonadism ?    - continue home regimen/outpt follow up ? ?HTN ?    - allow for permissive HTN ? ?DM2 ?    - check A1c ?    - SSI, glucose checks ?    - DM diet if he passes swallow screen ? ?Gout ?    - resume home regimen ? ?AKI ?    - check renal US ?    - fluids, follow ? ? Advance Care Planning:   Code Status: FULL ? ?Consults: Neurology ? ?Family Communication: w/ friend at bedside ? ?Severity of Illness: ?The appropriate patient status for this patient is INPATIENT. Inpatient status is judged to be reasonable and necessary in order to provide the required intensity of service to ensure the patient's safety. The patient's presenting symptoms, physical exam findings, and initial radiographic and laboratory data in the context of their chronic comorbidities is felt to place them at high risk for further clinical  deterioration. Furthermore, it is not anticipated that the patient will be medically stable for discharge from the hospital within 2 midnights of admission.  ? ?* I certify that at the point of admission it is

## 2022-02-04 NOTE — ED Provider Notes (Addendum)
?[facility]-EMERGENCY DEPT ?Provider Note ? ? ?CSN: [id number] ?Arrival date & time: 02/04/22  0031 ? ?  ? ?History ? ?Chief Complaint  ?Patient presents with  ? Numbness  ? ? ?Edwin [name] is a 66 y.o. male. ? ?The history is provided by the patient.  ?Edwin [name] is a 66 y.o. male who presents to the Emergency Department complaining of numbness.  He presents to the ED accompanied by his friend for evaluation of left sided numbness that started on Friday.  He states on Wednesday he woke with vertigo and nausea.  Overall sxs were improved but still present on Thursday.  On Thursday evening he tripped and struck his head on the bathtub.  He saw his PCP on Friday and had a CT head performed.  During his PCP visit he developed left face/arm/leg numbness, feels slightly weak.  He is starting to experience increased difficulty walking - feels like he falls to the left.   ? ?No fever, HA, neck pain, chest pain.  No prior similar sxs.  He is left hand dominant, degree in chemistry and teaches piano.  He is having difficulty writing and playing piano.   ? ?No tobacco, alcohol, drug use.   ? ?Hx/o DM, HTN, bipolar disorder.  ?  ? ?Home Medications ?Prior to Admission medications   ?Medication Sig Start Date End Date Taking? Authorizing Provider  ?Cholecalciferol 1000 units TBDP Take 1 tablet by mouth daily.    Yes [provider]  ?dapagliflozin propanediol (FARXIGA) 10 MG TABS tablet Take 10 mg by mouth daily.   Yes [provider]  ?diclofenac Sodium (VOLTAREN) 1 % GEL Apply 2 g topically daily as needed (for knee pain).   Yes [provider]  ?divalproex (DEPAKOTE ER) 500 MG 24 hr tablet Take 2 tablets (1,000 mg total) by mouth at bedtime. 01/01/22  Yes Cottle, Steva Ready., MD  ?DULoxetine (CYMBALTA) 60 MG capsule Take 2 capsules (120 mg total) by mouth daily. 01/01/22  Yes Cottle, Steva Ready., MD  ?febuxostat (ULORIC) 40 MG tablet Take 40 mg by mouth daily.   Yes  [provider]  ?furosemide (LASIX) 20 MG tablet Take 20 mg by mouth daily as needed for edema. May take 2 to 3 days weekly 07/21/19  Yes [provider]  ?hydrALAZINE (APRESOLINE) 50 MG tablet Take 50 mg by mouth 3 (three) times daily.   Yes [provider]  ?lamoTRIgine (LAMICTAL) 200 MG tablet Take 1 tablet (200 mg total) by mouth every morning. 01/01/22  Yes Cottle, Steva Ready., MD  ?LevOCARNitine (L-CARNITINE) 500 MG TABS Take 500 mg by mouth 2 (two) times daily.   Yes [provider]  ?losartan (COZAAR) 50 MG tablet Take 1 tablet (50 mg total) by mouth daily. ?Patient taking differently: Take 100 mg by mouth daily. 09/23/14  Yes Adonis Brook, NP  ?metFORMIN (GLUCOPHAGE-XR) 500 MG 24 hr tablet Take 500 mg by mouth 2 (two) times daily. 01/07/22  Yes [provider]  ?metoprolol tartrate (LOPRESSOR) 25 MG tablet Take 25 mg by mouth 2 (two) times daily. 12/11/21  Yes [provider]  ?multivitamin-lutein (OCUVITE-LUTEIN) CAPS capsule Take 1 capsule by mouth daily.   Yes [provider]  ?pramipexole (MIRAPEX) 0.5 MG tablet Take 1 tablet (0.5 mg total) by mouth 2 (two) times daily. 01/01/22  Yes Cottle, Steva Ready., MD  ?   ? ?Allergies    ?Allopurinol, Penicillins, Kerendia [finerenone], Aspirin, and Ciprofloxacin   ? ?  Review of Systems   ?Review of Systems  ?All other systems reviewed and are negative. ? ?Physical Exam ?Updated Vital Signs ?BP (!) 183/53   Pulse (!) 54   Temp 97.8 ?F (36.6 ?C) (Oral)   Resp 20   Ht 5\' 11"  (1.803 m)   Wt 108.9 kg   SpO2 100%   BMI 33.47 kg/m?  ?Physical Exam ?Vitals and nursing note reviewed.  ?Constitutional:   ?   Appearance: He is well-developed.  ?HENT:  ?   Head: Normocephalic and atraumatic.  ?Cardiovascular:  ?   Rate and Rhythm: Normal rate and regular rhythm.  ?   Heart sounds: No murmur heard. ?Pulmonary:  ?   Effort: Pulmonary effort is normal. No respiratory distress.  ?   Breath sounds: Normal breath  sounds.  ?Abdominal:  ?   Palpations: Abdomen is soft.  ?   Tenderness: There is no abdominal tenderness. There is no guarding or rebound.  ?Musculoskeletal:     ?   General: No tenderness.  ?   Comments: 1+ edema to BLE.  Chronic venous stasis changes to BLE  ?Skin: ?   General: Skin is warm and dry.  ?Neurological:  ?   Mental Status: He is alert and oriented to person, place, and time.  ?   Comments: PERRL, EOMI.  No asymmetry of facial movement.  5/5 strength in all four extremities.  Altered sensation to light touch in left face, arm, leg.  No pronator drift.  Visual fields grossly intact.  Shuffling and unsteady gait  ?Psychiatric:     ?   Behavior: Behavior normal.  ? ? ?ED Results / Procedures / Treatments   ?Labs ?(all labs ordered are listed, but only abnormal results are displayed) ?Labs Reviewed  ?COMPREHENSIVE METABOLIC PANEL - Abnormal; Notable for the following components:  ?    Result Value  ? Glucose, Bld 113 (*)   ? BUN 46 (*)   ? Creatinine, Ser 1.74 (*)   ? GFR, Estimated 44 (*)   ? All other components within normal limits  ?URINALYSIS, ROUTINE W REFLEX MICROSCOPIC - Abnormal; Notable for the following components:  ? Protein, ur 30 (*)   ? All other components within normal limits  ?I-STAT CHEM 8, ED - Abnormal; Notable for the following components:  ? BUN 41 (*)   ? Creatinine, Ser 1.90 (*)   ? Glucose, Bld 120 (*)   ? All other components within normal limits  ?PROTIME-INR  ?APTT  ?CBC  ?DIFFERENTIAL  ?RAPID URINE DRUG SCREEN, HOSP PERFORMED  ?URINALYSIS, MICROSCOPIC (REFLEX)  ?ETHANOL  ?VALPROIC ACID LEVEL  ? ? ?EKG ?EKG Interpretation ? ?Date/Time:  Sunday February 04 2022 02:11:56 EDT ?Ventricular Rate:  63 ?PR Interval:  204 ?QRS Duration: 119 ?QT Interval:  441 ?QTC Calculation: 452 ?R Axis:   4 ?Text Interpretation: Sinus rhythm Incomplete right bundle branch block Low voltage, extremity and precordial leads Confirmed by 07-17[birth year]1988 [id number]) on 02/04/2022 2:17:24 AM ? ?Radiology ?CT HEAD  WO CONTRAST ? ?Result Date: 02/04/2022 ?CLINICAL DATA:  Left-sided numbness, gait difficulty, dizziness, stroke suspected. EXAM: CT HEAD WITHOUT CONTRAST TECHNIQUE: Contiguous axial images were obtained from the base of the skull through the vertex without intravenous contrast. RADIATION DOSE REDUCTION: This exam was performed according to the departmental dose-optimization program which includes automated exposure control, adjustment of the mA and/or kV according to patient size and/or use of iterative reconstruction technique. COMPARISON:  02/02/2022. FINDINGS: Brain: No acute intracranial hemorrhage, midline shift  or mass effect. No extra-axial fluid collection. Diffuse atrophy is noted. Periventricular white matter hypodensities are seen bilaterally. There is no hydrocephalus. Vascular: No hyperdense vessel or unexpected calcification. Skull: Normal. Negative for fracture or focal lesion. Sinuses/Orbits: No acute finding. Other: None. IMPRESSION: 1. No acute intracranial process. 2. Mild atrophy with chronic microvascular ischemic changes. Electronically Signed   By: Thornell Sartorius M.D.   On: 02/04/2022 02:27  ? ?CT HEAD WO CONTRAST ( ) ? ?Result Date: 02/03/2022 ?CLINICAL DATA:  Head injury after fall in bathroom last night, at top of head. Left side arm/leg/body tingling. Left eye feels droopy, dizzy vision changes. EXAM: CT HEAD WITHOUT CONTRAST TECHNIQUE: Contiguous axial images were obtained from the base of the skull through the vertex without intravenous contrast. RADIATION DOSE REDUCTION: This exam was performed according to the departmental dose-optimization program which includes automated exposure control, adjustment of the mA and/or kV according to patient size and/or use of iterative reconstruction technique. COMPARISON:  None. FINDINGS: Brain: There is mild-to-moderate cortical atrophy, possibly mildly advanced for patient age. The ventricles are normal in configuration. The basilar cisterns are  patent. No mass, mass effect, or midline shift. No acute intracranial hemorrhage is seen. No abnormal extra-axial fluid collection. Preservation of the normal cortical gray-white interface without CT evidence of an acu

## 2022-02-05 ENCOUNTER — Other Ambulatory Visit: Payer: Self-pay

## 2022-02-05 ENCOUNTER — Inpatient Hospital Stay (HOSPITAL_COMMUNITY): Payer: BC Managed Care – PPO

## 2022-02-05 ENCOUNTER — Other Ambulatory Visit: Payer: Self-pay | Admitting: Neurology

## 2022-02-05 DIAGNOSIS — I639 Cerebral infarction, unspecified: Secondary | ICD-10-CM

## 2022-02-05 DIAGNOSIS — N182 Chronic kidney disease, stage 2 (mild): Secondary | ICD-10-CM

## 2022-02-05 DIAGNOSIS — N1832 Chronic kidney disease, stage 3b: Secondary | ICD-10-CM | POA: Diagnosis not present

## 2022-02-05 DIAGNOSIS — I10 Essential (primary) hypertension: Secondary | ICD-10-CM

## 2022-02-05 DIAGNOSIS — E119 Type 2 diabetes mellitus without complications: Secondary | ICD-10-CM

## 2022-02-05 DIAGNOSIS — I6302 Cerebral infarction due to thrombosis of basilar artery: Secondary | ICD-10-CM

## 2022-02-05 DIAGNOSIS — F314 Bipolar disorder, current episode depressed, severe, without psychotic features: Secondary | ICD-10-CM

## 2022-02-05 DIAGNOSIS — I6389 Other cerebral infarction: Secondary | ICD-10-CM

## 2022-02-05 LAB — ECHOCARDIOGRAM COMPLETE
AR max vel: 3.49 cm2
AV Area VTI: 3.26 cm2
AV Area mean vel: 3.15 cm2
AV Mean grad: 4 mmHg
AV Peak grad: 7.4 mmHg
Ao pk vel: 1.36 m/s
Area-P 1/2: 3.54 cm2
Height: 71 in
S' Lateral: 3.2 cm
Weight: 3840 oz

## 2022-02-05 LAB — GLUCOSE, CAPILLARY
Glucose-Capillary: 94 mg/dL (ref 70–99)
Glucose-Capillary: 135 mg/dL — ABNORMAL HIGH (ref 70–99)

## 2022-02-05 LAB — HIV ANTIBODY (ROUTINE TESTING W REFLEX): HIV Screen 4th Generation wRfx: NONREACTIVE

## 2022-02-05 MED ORDER — DIVALPROEX SODIUM ER 500 MG PO TB24
1000.0000 mg | ORAL_TABLET | Freq: Every day | ORAL | Status: DC
Start: 2022-02-05 — End: 2022-02-05

## 2022-02-05 MED ORDER — METOPROLOL TARTRATE 25 MG PO TABS: 25.0000 mg | ORAL_TABLET | Freq: Two times a day (BID) | ORAL | Status: DC

## 2022-02-05 MED ORDER — LAMOTRIGINE 100 MG PO TABS
200.0000 mg | ORAL_TABLET | Freq: Every morning | ORAL | Status: DC
  Administered 2022-02-05: 200 mg via ORAL
  Filled 2022-02-05: qty 2

## 2022-02-05 MED ORDER — DULOXETINE HCL 60 MG PO CPEP
120.0000 mg | ORAL_CAPSULE | Freq: Every day | ORAL | Status: DC
  Administered 2022-02-05: 120 mg via ORAL
  Filled 2022-02-05: qty 2

## 2022-02-05 MED ORDER — ROSUVASTATIN CALCIUM 20 MG PO TABS: 20.0000 mg | ORAL_TABLET | Freq: Every day | ORAL | 3 refills | Status: DC

## 2022-02-05 MED ORDER — CLOPIDOGREL BISULFATE 75 MG PO TABS: 75.0000 mg | ORAL_TABLET | Freq: Every day | ORAL | 3 refills | Status: DC

## 2022-02-05 MED ORDER — LOSARTAN POTASSIUM 50 MG PO TABS: 100.0000 mg | ORAL_TABLET | Freq: Every day | ORAL | Status: DC

## 2022-02-05 MED ORDER — HYDRALAZINE HCL 50 MG PO TABS: 50.0000 mg | ORAL_TABLET | Freq: Three times a day (TID) | ORAL | Status: DC

## 2022-02-05 NOTE — Evaluation (Addendum)
Speech Language Pathology Evaluation ?Patient Details ?Name: Edwin [name] ?MRN: [id number] ?DOB: [birth year] ?Today's Date: 02/05/2022 ?Time: 1007-1040 ?SLP Time Calculation (min) (ACUTE ONLY): 33 min ? ?Problem List:  ?Patient Active Problem List  ? Diagnosis Date Noted  ? Left-sided weakness 02/04/2022  ? Diabetes mellitus type 2 in nonobese ([place]) 02/04/2022  ? CVA (cerebral vascular accident) ([place]) 02/04/2022  ? Enteritis of ileum 02/02/2017  ? SBO (small bowel obstruction) ([place]) 02/02/2017  ? Persistent recurrent vomiting 02/01/2017  ? CKD (chronic kidney disease), stage II 05/19/2016  ? Abdominal pain 05/19/2016  ? Hypokalemia 05/19/2016  ? Gout   ? Bipolar affective disorder, depressed, severe ([place]) 09/20/2014  ? Essential hypertension 11/23/2013  ? 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 ?Past Medical History:  ?Past Medical History:  ?Diagnosis Date  ? Allergic rhinitis   ? Bipolar disorder ([place])   ? Bradycardia 2012  ?  due to lithium  ? CKD (chronic kidney disease) stage 2, GFR 60-89 ml/min   ? Gout   ? Hypertension   ? Mild renal insufficiency   ? , with creatinine of 1.3 in 2010, was side effect of med  ? Obesity   ? ,moderate  ? Prostatitis   ? , Episodic  ? Suicide attempt ([place]) 09/20/2014  ? ?Past Surgical History:  ?Past Surgical History:  ?Procedure Laterality Date  ? APPENDECTOMY    ? CATARACT EXTRACTION Right   ? COLONOSCOPY WITH PROPOFOL N/A 06/14/2014  ? Procedure: COLONOSCOPY WITH PROPOFOL;  Surgeon: Garlan Fair, MD;  Location: [facility] ENDOSCOPY;  Service: Endoscopy;  Laterality: N/A;  ? TONSILLECTOMY    ? UMBILICAL HERNIA REPAIR    ? ?HPI:  ?Edwin [name] is a 66 y.o. male who presented to [facility] on 3/19 for evaluation of left-sided sensory deficits and difficulty with ambulation.  MRI 3/19: "Small acute infarct in the Right Medullary Pyramid." Pt with a medical history significant for bipolar disorder, essential hypertension, CKD stage II, obesity.  ? ?Assessment / Plan / Recommendation ?Clinical  Impression ? Pt presents with functional cognitive linguistic abilities.  Pt was assessed using the COGNISTAT (see below for additional information) and scored within the average range on all subtests administered.  Pt reports no new deficits but does note mild word finding difficulty at baseline. During evaluation pt missed one target ("shoehorn" for "horseshoe") but was able to describe its function and arrived at correct answer with phonemic cuing.  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  Pt participated in high level conversation without any difficulty.  Demonstrated high level anticipatory language ability by finishing SLPs sentence while therapist was distracted by resetting room clock.  Speech is clear without dysarthria.  ? ?Pt expresses concern about his occasional word finding difficulty as it relates to his family history (father had early onset Alzheimer's disease) and his imaging from this admission.  Recommended to pt to follow up with neurology here and likely as outpatient for full work up related to any neurocognitive disorder.   ? ?Pt has no further ST needs at this time; SLP will sign off. ? ?COGNISTAT: All subtests are within the average range, except where otherwise specified. ? ?Orientation:  12/12 ?Attention: 8/8 ?Comprehension: 6/6 ?Repetition: 12/12 ?Naming: 7/8 ?Construction: not assessed ?Memory: 12/12 ?Calculations: 4/4 ?Similarities: 6/8 ?Judgment: 5/6 ? ?   ?SLP Assessment ? SLP Recommendation/Assessment: Patient does not need any further [facility] ?SLP Visit Diagnosis: Cognitive communication deficit (R41.841)  ?  ?Recommendations  for follow up therapy are one component of a multi-disciplinary discharge planning process, led by the attending physician.  Recommendations may be updated based on patient status, additional functional criteria and insurance authorization. ?   ?Follow Up Recommendations ? No SLP follow up  ?   ?Assistance Recommended at Discharge ? None  ?Functional Status Assessment Patient has not had a recent decline in their functional status  ?Frequency and Duration   N/A ?  ?  ?   ?SLP Evaluation ?Cognition ? Overall Cognitive Status: Within Functional Limits for tasks assessed ?Orientation Level: Oriented X4 ?Attention: Focused;Sustained ?Focused Attention: Appears intact ?Sustained Attention: Appears intact ?Memory: Appears intact ?Awareness: Appears intact ?Executive Function: Reasoning ?Reasoning: Appears intact  ?  ?   ?Comprehension ? Auditory Comprehension ?Overall Auditory Comprehension: Appears within functional limits for tasks assessed ?Commands: Within Functional Limits ?Conversation: Complex ?Visual Recognition/Discrimination ?Discrimination: Not tested ?Reading Comprehension ?Reading Status: Not tested  ?  ?Expression Expression ?Primary Mode of Expression: Verbal ?Verbal Expression ?Overall Verbal Expression: Appears within functional limits for tasks assessed ?Repetition: No impairment ?Naming: No impairment ?Pragmatics: No impairment ?Written Expression ?Dominant Hand: Left ?Written Expression: Not tested   ?Oral / Motor ? Motor Speech ?Overall Motor Speech: Appears within functional limits for tasks assessed ?Respiration: Within functional limits ?Phonation: Normal ?Resonance: Within functional limits ?Articulation: Within functional limitis ?Intelligibility: Intelligible ?Motor Planning: Witnin functional limits ?Motor Speech Errors: Not applicable   ?        ? ?Edwin Brann E Hoyte Ziebell, MA, CCC-SLP ?Acute Rehabilitation Services ?Office: [phone] ?02/05/2022, 10:50 AM ? ?

## 2022-02-05 NOTE — Progress Notes (Signed)
?  Transition of Care (TOC) Screening Note ? ? ?Patient Details  ?Name: Edwin [name] ?Date of Birth: August 24, 1956 ? ? ?Transition of Care (TOC) CM/SW Contact:    ?Harriet Masson, RN ?Phone Number: ?02/05/2022, 9:04 AM ? ? ? ?Transition of Care Department [facility]) has reviewed patient and no TOC needs have been identified at this time. We will continue to monitor patient advancement through interdisciplinary progression rounds. If new patient transition needs arise, please place a TOC consult. ? ? ?

## 2022-02-05 NOTE — Progress Notes (Signed)
STROKE TEAM PROGRESS NOTE  ? ?INTERVAL HISTORY ?His RN is at the bedside.  Pt is dressed up, can not wait to be discharged. 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  ? ?OBJECTIVE ?Vitals:  ? 02/04/22 1643 02/04/22 1745 02/04/22 2138 02/05/22 0001  ?BP:  (!) 149/84 (!) 151/95 (!) 190/92  ?Pulse:  86 (!) 58 65  ?Resp:  17 17 20   ?Temp: 98.2 ?F (36.8 ?C) 97.7 ?F (36.5 ?C) 97.7 ?F (36.5 ?C)   ?TempSrc:  Oral Oral   ?SpO2:  93% 98% 95%  ?Weight:      ?Height:      ? ? ?CBC:  ?Recent Labs  ?Lab 02/04/22 ?0129 02/04/22 ?0155  ?WBC 10.1  --   ?NEUTROABS 6.9  --   ?HGB 15.8 15.3  ?HCT 46.7 45.0  ?MCV 92.1  --   ?PLT 251  --   ? ? ?Basic Metabolic Panel:  ?Recent Labs  ?Lab 02/04/22 ?0129 02/04/22 ?0155  ?NA 139 140  ?K 4.3 4.4  ?CL 106 105  ?CO2 23  --   ?GLUCOSE 113* 120*  ?BUN 46* 41*  ?CREATININE 1.74* 1.90*  ?CALCIUM 9.3  --   ? ? ?Lipid Panel:  ?   ?Component Value Date/Time  ? CHOL 159 02/04/2022 0123  ? TRIG 68 02/04/2022 0123  ? HDL 49 02/04/2022 0123  ? CHOLHDL 3.2 02/04/2022 0123  ? VLDL 14 02/04/2022 0123  ? [place] 96 02/04/2022 0123  ? ?HgbA1c:  ?Lab Results  ?Component Value Date  ? HGBA1C 5.6 02/04/2022  ? ?Urine Drug Screen:  ?   ?Component Value Date/Time  ? [place] DETECTED 02/04/2022 0257  ? [place] DETECTED 02/04/2022 0257  ? [place] DETECTED 02/04/2022 0257  ? [place] DETECTED 02/04/2022 0257  ? [place] DETECTED 02/04/2022 0257  ? [place] DETECTED 02/04/2022 0257  ?  ?Alcohol Level No results found for: ETH ? ?IMAGING ? ? ?CT HEAD WO CONTRAST ? ?Result Date: 02/04/2022 ?CLINICAL DATA:  Left-sided numbness, gait difficulty, dizziness, stroke suspected. EXAM: CT HEAD WITHOUT CONTRAST TECHNIQUE: Contiguous axial images were obtained from the base of the skull through the vertex without intravenous contrast. RADIATION DOSE REDUCTION: This exam was performed according to the departmental dose-optimization program which includes automated exposure control,  adjustment of the mA and/or kV according to patient size and/or use of iterative reconstruction technique. COMPARISON:  02/02/2022. FINDINGS: Brain: No acute intracranial hemorrhage, midline shift or mass effect. No extra-axial fluid collection. Diffuse atrophy is noted. Periventricular white matter hypodensities are seen bilaterally. There is no hydrocephalus. Vascular: No hyperdense vessel or unexpected calcification. Skull: Normal. Negative for fracture or focal lesion. Sinuses/Orbits: No acute finding. Other: None. IMPRESSION: 1. No acute intracranial process. 2. Mild atrophy with chronic microvascular ischemic changes. Electronically Signed   By: Brett Fairy M.D.   On: 02/04/2022 02:27  ? ?MR ANGIO HEAD WO CONTRAST ? ?Result Date: 02/04/2022 ?CLINICAL DATA:  66 year old male left side numbness, dizziness and abnormal gait. EXAM: MRI HEAD WITHOUT CONTRAST MRA HEAD WITHOUT CONTRAST MRA NECK WITHOUT AND WITH CONTRAST TECHNIQUE: Multiplanar, multiecho pulse sequences of the brain and surrounding structures were obtained without intravenous contrast. Angiographic images of the Circle of Willis were obtained using MRA technique without intravenous contrast. Angiographic images of the neck were obtained using MRA technique without and with intravenous contrast. Carotid stenosis measurements (when applicable) are obtained utilizing NASCET criteria, using the distal internal carotid diameter as the denominator. CONTRAST:  58mL GADAVIST GADOBUTROL 1 MMOL/ML IV SOLN COMPARISON:  Plain head CT 0156 hours today and earlier. FINDINGS: MRI HEAD FINDINGS Brain: Cerebral volume is within normal limits for age. No midline shift, mass effect, evidence of mass lesion, ventriculomegaly, extra-axial collection or acute intracranial hemorrhage. Cervicomedullary junction and pituitary are within normal limits. Confluent small area of restricted diffusion in the right medullary pyramid, tracking into the paracentral dorsal medulla on  series 5, image 57. Faint associated T2 and FLAIR hyperintensity. No acute brainstem hemorrhage. No mass effect. No other restricted diffusion. Small chronic infarct infarct in the posterior right cerebellum also on series 8, image 6. And evidence of a chronic hemorrhage in the left midbrain on series 9, image 29. But otherwise largely normal for age gray and white matter signal in the brain. Mild for age superior frontal lobe nonspecific white matter T2 and FLAIR hyperintensity. No other chronic cerebral blood products identified. No cerebral cortical encephalomalacia. Vascular: Major intracranial vascular flow voids are preserved, the distal left vertebral artery appears dominant, but see MRA findings below. Skull and upper cervical spine: Negative for age visible cervical spine. Visualized bone marrow signal is within normal limits. Sinuses/Orbits: Postoperative changes to both globes. Otherwise negative orbits. Paranasal Visualized paranasal sinuses and mastoids are clear. Other: Visible internal auditory structures appear normal. Negative visible scalp and face. MRA NECK FINDINGS Precontrast time-of-flight images reveal a 3 vessel arch configuration with antegrade flow in the bilateral cervical carotid and vertebral arteries throughout the neck. The left vertebral artery appears dominant and the right somewhat diminutive throughout. Both carotid bifurcations are patent. Post-contrast neck MRA images. Proximal great vessels appear within normal limits. Right CCA is patent without stenosis, mild tortuosity. The right carotid bifurcation there is mostly ECA origin irregularity (series 101, image 10). No cervical right ICA stenosis. Right ICA siphon and terminus also appear within normal limits. Mildly to moderately tortuous left CCA with no stenosis. Capacious left carotid bifurcation. Cervical left ICA and also visible left ICA siphon and terminus appear patent without stenosis. No proximal subclavian artery  stenosis identified, tortuous proximal right subclavian artery. Normal left vertebral artery origin with tortuous V1 segment. Dominant left vertebral artery without stenosis to the basilar. Diminutive right vertebral artery is patent and enhancing throughout the neck with no convincing extracranial stenosis. However, the distal right V4 segment appears beaded and irregular on series 104, image 5 MRA HEAD FINDINGS Intermittently degraded by mild motion artifact. And accidentally the distal vertebral arteries and proximal basilar were not imaged. Visible basilar artery is patent with mild irregularity. Basilar tip, SCA and PCA origins are normal. Posterior communicating arteries are diminutive or absent. Bilateral PCA branches are within normal limits. Visible ICA siphons are patent without stenosis. Patent carotid termini, MCA and ACA origins. Codominant A1 segments. MCA M1 segments and bifurcations appear patent. But other MCA and ACA branch detail is degraded by motion. IMPRESSION: 1. Small acute infarct in the Right Medullary Pyramid (medullary perforator artery territory, see #2). No associated acute hemorrhage or mass effect. 2. 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 No large vessel occlusion. 3. Intracranial MRA is degraded.  No large vessel occlusion. 4. Small chronic infarct in the right cerebellum, and evidence of a chronic micro-hemorrhage in the left midbrain. Mild for age superimposed cerebral white matter signal changes, most commonly due to small vessel disease. Electronically Signed   By: Herminio Heads.D.  On: 02/04/2022 09:53  ? ?MR Angiogram Neck W or Wo Contrast ? ?Result Date: 02/04/2022 ?CLINICAL DATA:  66 year old male left side numbness, dizziness and abnormal gait. EXAM: MRI HEAD WITHOUT CONTRAST MRA HEAD WITHOUT CONTRAST MRA NECK WITHOUT AND WITH CONTRAST TECHNIQUE:  Multiplanar, multiecho pulse sequences of the brain and surrounding structures were obtained without intravenous contrast. Angiographic images of the Circle of Willis were obtained using MRA technique without intravenous

## 2022-02-05 NOTE — Progress Notes (Signed)
Echocardiogram ?2D Echocardiogram has been performed. ? ?Edwin [name] ?02/05/2022, 1:36 PM ?

## 2022-02-05 NOTE — Discharge Summary (Addendum)
? ?PATIENT DETAILS ?Name: Edwin [name] ?Age: 66 y.o. ?Sex: male ?Date of Birth: Oct 14, 1956 ?MRN: [id number]. ?Admitting Physician: Jonnie Finner, DO ?[id number], Herbie Baltimore, MD ? ?Admit Date: 02/04/2022 ?Discharge date: 02/05/2022 ? ?Recommendations for Outpatient Follow-up:  ?Follow up with PCP in 1-2 weeks ?Please obtain CMP/CBC in one week ?Please ensure follow-up with stroke clinic ?Needs to follow with outpatient sleep MD to resume CPAP ? ?Admitted From:  ?Home ? ?Disposition: ?Home ?  ?Discharge Condition: ?good ? ?CODE STATUS: ?  Code Status: Full Code  ? ?Diet recommendation:  ?Diet Order   ? ?       ?  Diet - low sodium heart healthy       ?  ?  Diet Carb Modified       ?  ?  Diet Carb Modified Fluid consistency: Thin; Room service appropriate? Yes  Diet effective now       ?  ? ?  ?  ? ?  ?  ? ?Brief Summary: ?Patient is a 66 y.o.  male with history of DM-2, HTN, bipolar disorder who presented with left leaning gait, left-sided numbness-was found to have acute CVA.  See below for further details. ?  ?Significant events: ?3/19>> admit to [facility] for CVA work-up. ?  ?Significant studies: ?3/19>> MRI brain: Small acute infarct in the right medullary pyramid. ?3/19>> MRA neck: Moderate to severe distal right vertebral segment stenosis. ?3/19>> MRI brain: Poor study but no LVO. ?3/19>> renal ultrasound: No hydronephrosis-increased renal cortical echogenicity. ?3/19>> LDL: 96 ?3/19>> A1c: 5.6 ?  ?Significant microbiology data: ?  ?  ?Procedures: ?None ?  ?Consults: ?Neurology ? ?Brief Hospital Course: ?Acute CVA: Either from small vessel disease or from known vertebral artery stenosis.   Spoke with stroke MD-Dr. Xu-recommendations are to continue Plavix/statin and have patient follow-up with the stroke clinic.  Home health services have been ordered. ? ?CKD stage IIIb: Per patient-this is a known finding and he follows with nephrologist Dr. Candiss Norse.  Continue follow-up with nephrology in the outpatient setting. ? ?HTN:  Allow permissive hypertension-should be able to resume most of his hypertension medications over the next few days. ?  ?DM-2: A1c as above-resume oral hypoglycemic agents on discharge. ?  ?Bipolar disorder: Resume outpatient regimen on discharge. ? ?OSA:he stopped CPAP a few years back, encouraged him to follow up with his outpatient doctors (claims he used to follow with a "sleep doc"). He does have some nocturnal bradycardia-which should improve with resumption of CPAP-but have asked patient to hold his beta blocker for now. ?  ?Obesity: ?Estimated body mass index is 33.47 kg/m? as calculated from the following: ?  Height as of this encounter: 5\' 11"  (1.803 m). ?  Weight as of this encounter: 108.9 kg.  ? ?Obesity: ?Estimated body mass index is 33.47 kg/m? as calculated from the following: ?  Height as of this encounter: 5\' 11"  (1.803 m). ?  Weight as of this encounter: 108.9 kg.  ? ? ?Discharge Diagnoses:  ?Principal Problem: ?  Left-sided weakness ?Active Problems: ?  Bipolar affective disorder, depressed, severe ([facility]) ?  Essential hypertension ?  Gout ?  CKD (chronic kidney disease), stage IIIb ?  Diabetes mellitus type 2 in nonobese [facility]) ?  CVA (cerebral vascular accident) [facility]) ? ? ?Discharge Instructions: ? ?Activity:  ?As tolerated  ? ?Discharge Instructions   ? ? Ambulatory referral to Neurology   Complete by: As directed ?  ? An appointment is requested in approximately: 1 week ?numbness  ?  Call MD for:  extreme fatigue   Complete by: As directed ?  ? Call MD for:  persistant dizziness or light-headedness   Complete by: As directed ?  ? Diet - low sodium heart healthy   Complete by: As directed ?  ? Diet Carb Modified   Complete by: As directed ?  ? Discharge instructions   Complete by: As directed ?  ? Follow with Primary MD  Josetta Huddle, MD in 1-2 weeks ? ?You will get a call from [facility] neurology for a follow-up appointment-if you do not hear from them-please give them a call. ? ?Please get a  complete blood count and chemistry panel checked by your Primary MD at your next visit, and again as instructed by your Primary MD. ? ?Get Medicines reviewed and adjusted: ?Please take all your medications with you for your next visit with your Primary MD ? ?Laboratory/radiological data: ?Please request your Primary MD to go over all hospital tests and procedure/radiological results at the follow up, please ask your Primary MD to get all Hospital records sent to his/her office. ? ?In some cases, they will be blood work, cultures and biopsy results pending at the time of your discharge. Please request that your primary care M.D. follows up on these results. ? ?Also Note the following: ?If you experience worsening of your admission symptoms, develop shortness of breath, life threatening emergency, suicidal or homicidal thoughts you must seek medical attention immediately by calling 911 or calling your MD immediately  if symptoms less severe. ? ?You must read complete instructions/literature along with all the possible adverse reactions/side effects for all the Medicines you take and that have been prescribed to you. Take any new Medicines after you have completely understood and accpet all the possible adverse reactions/side effects.  ? ?Do not drive when taking Pain medications or sleeping medications (Benzodaizepines) ? ?Do not take more than prescribed Pain, Sleep and Anxiety Medications. It is not advisable to combine anxiety,sleep and pain medications without talking with your primary care practitioner ? ?Special Instructions: If you have smoked or chewed Tobacco  in the last 2 yrs please stop smoking, stop any regular Alcohol  and or any Recreational drug use. ? ?Wear Seat belts while driving. ? ?Please note: ?You were cared for by a hospitalist during your hospital stay. Once you are discharged, your primary care physician will handle any further medical issues. Please note that NO REFILLS for any discharge  medications will be authorized once you are discharged, as it is imperative that you return to your primary care physician (or establish a relationship with a primary care physician if you do not have one) for your post hospital discharge needs so that they can reassess your need for medications and monitor your lab values.  ? Increase activity slowly   Complete by: As directed ?  ? ?  ? ?Allergies as of 02/05/2022   ? ?   Reactions  ? Allopurinol Other (See Comments)  ? Made pt emotionally unstable   ? Penicillins Hives, Other (See Comments)  ? Has patient had a PCN reaction causing immediate rash, facial/tongue/throat swelling, SOB or lightheadedness with hypotension: No ?Has patient had a PCN reaction causing severe rash involving mucus membranes or skin necrosis: No ?Has patient had a PCN reaction that required hospitalization No ?Has patient had a PCN reaction occurring within the last 10 years: No ?If all of the above answers are "NO", then may proceed with Cephalosporin use.  ? Carrington Clamp [  finerenone] Other (See Comments)  ? hyperkalemia  ? Aspirin Hives  ? Ciprofloxacin Rash  ? ?  ? ?  ?Medication List  ?  ? ?STOP taking these medications   ? ?metoprolol tartrate 25 MG tablet ?Commonly known as: LOPRESSOR ?  ? ?  ? ?TAKE these medications   ? ?Cholecalciferol 25 MCG (1000 UT) Tbdp ?Take 1 tablet by mouth daily. ?  ?clopidogrel 75 MG tablet ?Commonly known as: PLAVIX ?Take 1 tablet (75 mg total) by mouth daily. ?Start taking on: February 06, 2022 ?  ?dapagliflozin propanediol 10 MG Tabs tablet ?Commonly known as: FARXIGA ?Take 10 mg by mouth daily. ?  ?divalproex 500 MG 24 hr tablet ?Commonly known as: DEPAKOTE ER ?Take 2 tablets (1,000 mg total) by mouth at bedtime. ?  ?DULoxetine 60 MG capsule ?Commonly known as: CYMBALTA ?Take 2 capsules (120 mg total) by mouth daily. ?  ?febuxostat 40 MG tablet ?Commonly known as: ULORIC ?Take 40 mg by mouth daily. ?  ?furosemide 20 MG tablet ?Commonly known as: LASIX ?Take 20  mg by mouth daily as needed for edema. May take 2 to 3 days weekly ?  ?hydrALAZINE 50 MG tablet ?Commonly known as: APRESOLINE ?Take 1 tablet (50 mg total) by mouth 3 (three) times daily. ?Start taking on: March

## 2022-02-05 NOTE — Evaluation (Signed)
Occupational Therapy Evaluation ?Patient Details ?Name: Edwin [name] ?MRN: [id number] ?DOB: Mar 11, 1956 ?Today's Date: 02/05/2022 ? ? ?History of Present Illness Edwin [name] is a 66 y.o. male presenting with L sided numbness, dizziness, and fall at home. 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 PMH: DM2, HTN, hypogonadism, bipolar, gout, CKD, and suicide attempt (2015).  ? ?Clinical Impression ?  ?PTA, pt lives alone and reports Independent in all ADLs, IADLs and mobility without use of AD. Pt drives, works as a Engineer, agricultural and active in the community. Pt presents now with deficits in standing balance and L sided sensation (feels tingly/"asleep" per pt). UE strength grossly WFL ( LUE dominant). Pt overall min guard for ADLs and mobility without overt LOB. Trialed no AD vs RW with pt leaning towards RW use for mobility at this time. Pt with good awareness of his deficits and actively participated in safety education for home/community environments. Feel pt will progress back to baseline quickly and would benefit from OP OT follow-up at DC pending ability to get to/from appointments at DC. Will continue to follow acutely and update DC recs as appropriate.  ?   ? ?Recommendations for follow up therapy are one component of a multi-disciplinary discharge planning process, led by the attending physician.  Recommendations may be updated based on patient status, additional functional criteria and insurance authorization.  ? ?Follow Up Recommendations ? Outpatient OT (pending transportation availability and/or clearance to drive. If no transportation, would recommed HHOT)  ?  ?Assistance Recommended at Discharge Set up Supervision/Assistance  ?Patient can return home with the following Assistance with cooking/housework;Assist for transportation ? ?  ?Functional Status Assessment ? Patient has had a recent decline  in their functional status and demonstrates the ability to make significant improvements in function in a reasonable and predictable amount of time.  ?Equipment Recommendations ? Other (comment) (Rolling walker)  ?  ?Recommendations for Other Services   ? ? ?  ?Precautions / Restrictions Precautions ?Precautions: Fall;Other (comment) ?Precaution Comments: monitor BP ?Restrictions ?Weight Bearing Restrictions: No  ? ?  ? ?Mobility Bed Mobility ?Overal bed mobility: Modified Independent ?  ?  ?  ?  ?  ?  ?  ?  ? ?Transfers ?Overall transfer level: Needs assistance ?Equipment used: None, Rolling walker (2 wheels) ?Transfers: Sit to/from Stand ?Sit to Stand: Min guard ?  ?  ?  ?  ?  ?General transfer comment: appeared more confident with use of RW in standing. Without AD, pt benefits from holding to IV pole ?  ? ?  ?Balance Overall balance assessment: Needs assistance ?Sitting-balance support: No upper extremity supported, Feet supported ?Sitting balance-Leahy Scale: Good ?  ?  ?Standing balance support: No upper extremity supported, During functional activity ?Standing balance-Leahy Scale: Fair ?Standing balance comment: able to stand statically without support, benefits from 1-2 UE support during mobility to maintain balance. Pt feels tendency to drift to L side (not overtly observed) ?  ?  ?  ?  ?  ?  ?  ?  ?  ?  ?  ?   ? ?ADL either performed or assessed with clinical judgement  ? ?ADL Overall ADL's : Needs assistance/impaired ?Eating/Feeding: Independent;Sitting ?  ?Grooming: Supervision/safety;Standing ?  ?Upper Body Bathing: Supervision/ safety;Standing ?  ?Lower Body Bathing: Min guard;Sitting/lateral leans;Sit to/from stand ?  ?Upper Body Dressing : Set up;Sitting ?  ?Lower Body Dressing:  Min guard;Sitting/lateral leans;Sit to/from stand ?  ?Toilet Transfer: Min guard;Ambulation ?  ?Toileting- Water quality scientist and Hygiene: Min guard;Sit to/from stand;Sitting/lateral lean ?  ?  ?  ?Functional mobility  during ADLs: Min guard;Rolling walker (2 wheels) ?General ADL Comments: Pt endorses feeling unsteady with mobility, trial without AD and with RW with pt reporting RW may be helpful at home. educated on safety techniques for diminished sensation, continued bimanual tasks to integrate improved sensation, fall prevention strategies, confirming ability to drive, etc  ? ? ? ?Vision Ability to See in Adequate Light: 0 Adequate ?Patient Visual Report: No change from baseline ?Vision Assessment?: No apparent visual deficits  ?   ?Perception   ?  ?Praxis   ?  ? ?Pertinent Vitals/Pain Pain Assessment ?Pain Assessment: No/denies pain  ? ? ? ?Hand Dominance Left ?  ?Extremity/Trunk Assessment Upper Extremity Assessment ?Upper Extremity Assessment: LUE deficits/detail ?LUE Deficits / Details: diminished sensation; feels tingly/"asleep" per pt. able to grasp items without dropping them. strength WFL for dominant UE ?LUE Sensation: decreased light touch ?LUE Coordination: WNL ?  ?Lower Extremity Assessment ?Lower Extremity Assessment: Defer to PT evaluation ?  ?Cervical / Trunk Assessment ?Cervical / Trunk Assessment: Normal ?  ?Communication Communication ?Communication: No difficulties ?  ?Cognition Arousal/Alertness: Awake/alert ?Behavior During Therapy: [facility] for tasks assessed/performed ?Overall Cognitive Status: Within Functional Limits for tasks assessed ?  ?  ?  ?  ?  ?  ?  ?  ?  ?  ?  ?  ?  ?  ?  ?  ?General Comments: pleasant, aware of deficits. hx of bipolar ?  ?  ?General Comments  BP elevated 180s/110s after activity, denies dizziness or lightheadness. ? ?  ?Exercises   ?  ?Shoulder Instructions    ? ? ?Home Living Family/patient expects to be discharged to:: Private residence ?Living Arrangements: Alone ?Available Help at Discharge: Friend(s);Neighbor;Available PRN/intermittently ?Type of Home: House ?Home Access: Stairs to enter ?Entrance Stairs-Number of Steps: 3-4 ?Entrance Stairs-Rails: Right;Left ?Home Layout: One  level ?  ?  ?Bathroom Shower/Tub: Tub/shower unit;Walk-in shower ?  ?Bathroom Toilet: Standard ?  ?  ?Home Equipment: Grab bars - tub/shower ?  ?Additional Comments: Pt recently married but wife lives overseas, working on immigration to [place] ?  ? ?  ?Prior Functioning/Environment Prior Level of Function : Independent/Modified Independent;Working/employed;Driving ?  ?  ?  ?  ?  ?  ?Mobility Comments: no use of AD. reports a couple of falls (rolled out of bed, fell asleep in desk chair with chair coming out from under him) ?ADLs Comments: Works as English as a second language teacher, Independent with ADLs, IADLs, drives, grocery shops and goes out to eat a lot ?  ? ?  ?  ?OT Problem List: Decreased strength;Impaired balance (sitting and/or standing);Impaired sensation;Decreased knowledge of use of DME or AE ?  ?   ?OT Treatment/Interventions: Self-care/ADL training;Therapeutic exercise;DME and/or AE instruction;Therapeutic activities;Patient/family education  ?  ?OT Goals(Current goals can be found in the care plan section) Acute Rehab OT Goals ?Patient Stated Goal: get back to routine, drive and go out to eat, etc ?OT Goal Formulation: With patient ?Time For Goal Achievement: 02/19/22 ?Potential to Achieve Goals: Good ?ADL Goals ?Pt Will Perform Grooming: Independently;standing ?Pt Will Transfer to Toilet: Independently;with modified independence;ambulating ?Additional ADL Goal #1: Pt to report 3 fall prevention strategies to implement in home environment to maximize safety ?Additional ADL Goal #2: Pt to demonstrate ability to reach overhead and bend to floor to obtain ADL/IADL items Mod  I and without LOB  ?OT Frequency: Min 2X/week ?  ? ?Co-evaluation   ?  ?  ?  ?  ? ?  ?AM-PAC OT "6 Clicks" Daily Activity     ?Outcome Measure Help from another person eating meals?: None ?Help from another person taking care of personal grooming?: A Little ?Help from another person toileting, which includes using toliet, bedpan, or urinal?: A Little ?Help  from another person bathing (including washing, rinsing, drying)?: A Little ?Help from another person to put on and taking off regular upper body clothing?: A Little ?Help from another person to put on an

## 2022-02-05 NOTE — Progress Notes (Signed)
Inpatient Rehab Admissions Coordinator:  ? ?Per PT recommendation,  patient was screened for CIR candidacy by Megan Salon, MS, CCC-SLP. Per acute MD note, Pt. Does not wish to come to CIR has already set up [facility] services with the plan to d/c home with adequate family support. I will not pursue this Pt. For CIR admit.  If Pt. Status changes and he does not d/c, acute MD may place CIR consult and we will work him up.  ? ?Megan Salon, MS, CCC-SLP ?Rehab Admissions Coordinator  ?[phone] (celll) ?[phone] (office) ? ?

## 2022-02-05 NOTE — Progress Notes (Addendum)
PROGRESS NOTE        PATIENT DETAILS Name: Edwin [name] Age: 66 y.o. Sex: male Date of Birth: 06-12-1956 Admit Date: 02/04/2022 Admitting Physician Teddy Spike, DO ZOX:WRUEA, Molly Maduro, MD  Brief Summary: Patient is a 66 y.o.  male with history of DM-2, HTN, bipolar disorder who presented with left leaning gait, left-sided numbness-was found to have acute CVA.  See below for further details.  Significant events: 3/19>> admit to [facility] for CVA work-up.  Significant studies: 3/19>> MRI brain: Small acute infarct in the right medullary pyramid. 3/19>> MRA neck: Moderate to severe distal right vertebral segment stenosis. 3/19>> MRI brain: Poor study but no LVO. 3/19>> renal ultrasound: No hydronephrosis-increased renal cortical echogenicity. 3/19>> LDL: 96 3/19>> A1c: 5.6  Significant microbiology data:   Procedures: None  Consults: Neurology  Subjective: Lying comfortably in bed-denies any chest pain or shortness of breath.  Still complains of some tingling in his left arm.  Objective: Vitals: Blood pressure (!) 155/98, pulse 94, temperature 97.9 F (36.6 C), temperature source Oral, resp. rate 19, height 5\' 11"  (1.803 m), weight 108.9 kg, SpO2 96 %.   Exam: Gen Exam:Alert awake-not in any distress HEENT:atraumatic, normocephalic Chest: B/L clear to auscultation anteriorly CVS:S1S2 regular Abdomen:soft non tender, non distended Extremities:no edema Neurology: Non focal Skin: no rash  Pertinent Labs/Radiology: CBC Latest Ref Rng & Units 02/04/2022 02/04/2022 02/03/2017  WBC 4.0 - 10.5 K/uL - 10.1 7.4  Hemoglobin 13.0 - 17.0 g/dL 54.0 98.1 10.8(L)  Hematocrit 39.0 - 52.0 % 45.0 46.7 31.1(L)  Platelets 150 - 400 K/uL - 251 253    Lab Results  Component Value Date   NA 140 02/04/2022   K 4.4 02/04/2022   CL 105 02/04/2022   CO2 23 02/04/2022      Assessment/Plan: Acute CVA: Either from small vessel disease or from known vertebral  artery stenosis.  Work-up as above-awaiting echocardiogram.  Spoke with stroke MD-Dr. Xu-recommendations are to continue Plavix/statin-if echocardiogram does not show any major events-okay to discharge home with outpatient follow-up with neurology.  AKI versus CKD stage IIIb: Unclear whether this is AKI or CKD-no recent lab work available to check-is on losartan/as needed furosemide in the outpatient setting-plan is to hold these 2 agents and hopefully repeat blood work in a week or so.  His volume status is stable and do not think he requires IVF at this point.  Addendum 1:45 PM: Spoke with patient-he has known CKD stage IIIb and apparently follows with Dr. Thedore Mins (nephrologist) in the outpatient setting.  HTN: Allow permissive hypertension-should be able to resume most of his hypertension medications over the next few days.  DM-2: A1c as above-resume oral hypoglycemic agents on discharge.  Bipolar disorder: Resume outpatient regimen on discharge.  Obesity: Estimated body mass index is 33.47 kg/m as calculated from the following:   Height as of this encounter: 5\' 11"  (1.803 m).   Weight as of this encounter: 108.9 kg.   Code status:   Code Status: Full Code   DVT Prophylaxis: SCD's Start: 02/04/22 1812   Family Communication: None at bedside   Disposition Plan: Status is: Inpatient Remains inpatient appropriate because: Awaiting stroke work-up-especially echo possible discharge later today or tomorrow.   Planned Discharge Destination:Home with outpatient physical therapy   Diet: Diet Order  Diet Carb Modified Fluid consistency: Thin; Room service appropriate? Yes  Diet effective now                     Antimicrobial agents: Anti-infectives (From admission, onward)    None        MEDICATIONS: Scheduled Meds:   stroke: mapping our early stages of recovery book   Does not apply Once   clopidogrel  75 mg Oral Daily   divalproex  1,000 mg Oral QHS    DULoxetine  120 mg Oral Daily   insulin aspart  0-15 Units Subcutaneous TID WC   insulin aspart  0-5 Units Subcutaneous QHS   lamoTRIgine  200 mg Oral q morning   rosuvastatin  20 mg Oral Daily   Continuous Infusions: PRN Meds:.acetaminophen **OR** acetaminophen (TYLENOL) oral liquid 160 mg/5 mL **OR** acetaminophen   I have personally reviewed following labs and imaging studies  LABORATORY DATA: CBC: Recent Labs  Lab 02/04/22 0129 02/04/22 0155  WBC 10.1  --   NEUTROABS 6.9  --   HGB 15.8 15.3  HCT 46.7 45.0  MCV 92.1  --   PLT 251  --     Basic Metabolic Panel: Recent Labs  Lab 02/04/22 0129 02/04/22 0155  NA 139 140  K 4.3 4.4  CL 106 105  CO2 23  --   GLUCOSE 113* 120*  BUN 46* 41*  CREATININE 1.74* 1.90*  CALCIUM 9.3  --     GFR: Estimated Creatinine Clearance: 49.9 mL/min (A) (by C-G formula based on SCr of 1.9 mg/dL (H)).  Liver Function Tests: Recent Labs  Lab 02/04/22 0129  AST 23  ALT 35  ALKPHOS 62  BILITOT 0.3  PROT 7.4  ALBUMIN 4.3   No results for input(s): LIPASE, AMYLASE in the last 168 hours. No results for input(s): AMMONIA in the last 168 hours.  Coagulation Profile: Recent Labs  Lab 02/04/22 0129  INR 1.0    Cardiac Enzymes: No results for input(s): CKTOTAL, CKMB, CKMBINDEX, TROPONINI in the last 168 hours.  BNP (last 3 results) No results for input(s): PROBNP in the last 8760 hours.  Lipid Profile: Recent Labs    02/04/22 0123  CHOL 159  HDL 49  LDLCALC 96  TRIG 68  CHOLHDL 3.2    Thyroid Function Tests: No results for input(s): TSH, T4TOTAL, FREET4, T3FREE, THYROIDAB in the last 72 hours.  Anemia Panel: No results for input(s): VITAMINB12, FOLATE, FERRITIN, TIBC, IRON, RETICCTPCT in the last 72 hours.  Urine analysis:    Component Value Date/Time   COLORURINE YELLOW 02/04/2022 0258   APPEARANCEUR CLEAR 02/04/2022 0258   LABSPEC 1.010 02/04/2022 0258   PHURINE 6.0 02/04/2022 0258   GLUCOSEU NEGATIVE  02/04/2022 0258   HGBUR NEGATIVE 02/04/2022 0258   BILIRUBINUR NEGATIVE 02/04/2022 0258   KETONESUR NEGATIVE 02/04/2022 0258   PROTEINUR 30 (A) 02/04/2022 0258   UROBILINOGEN 0.2 09/20/2014 0058   NITRITE NEGATIVE 02/04/2022 0258   LEUKOCYTESUR NEGATIVE 02/04/2022 0258    Sepsis Labs: Lactic Acid, Venous    Component Value Date/Time   LATICACIDVEN 2.1 (HH) 05/19/2016 0322    MICROBIOLOGY: No results found for this or any previous visit (from the past 240 hour(s)).  RADIOLOGY STUDIES/RESULTS: CT HEAD WO CONTRAST  Result Date: 02/04/2022 CLINICAL DATA:  Left-sided numbness, gait difficulty, dizziness, stroke suspected. EXAM: CT HEAD WITHOUT CONTRAST TECHNIQUE: Contiguous axial images were obtained from the base of the skull through the vertex without intravenous contrast. RADIATION DOSE REDUCTION:  This exam was performed according to the departmental dose-optimization program which includes automated exposure control, adjustment of the mA and/or kV according to patient size and/or use of iterative reconstruction technique. COMPARISON:  02/02/2022. FINDINGS: Brain: No acute intracranial hemorrhage, midline shift or mass effect. No extra-axial fluid collection. Diffuse atrophy is noted. Periventricular white matter hypodensities are seen bilaterally. There is no hydrocephalus. Vascular: No hyperdense vessel or unexpected calcification. Skull: Normal. Negative for fracture or focal lesion. Sinuses/Orbits: No acute finding. Other: None. IMPRESSION: 1. No acute intracranial process. 2. Mild atrophy with chronic microvascular ischemic changes. Electronically Signed   By: Thornell Sartorius M.D.   On: 02/04/2022 02:27   MR ANGIO HEAD WO CONTRAST  Result Date: 02/04/2022 CLINICAL DATA:  66 year old male left side numbness, dizziness and abnormal gait. EXAM: MRI HEAD WITHOUT CONTRAST MRA HEAD WITHOUT CONTRAST MRA NECK WITHOUT AND WITH CONTRAST TECHNIQUE: Multiplanar, multiecho pulse sequences of the brain  and surrounding structures were obtained without intravenous contrast. Angiographic images of the Circle of Willis were obtained using MRA technique without intravenous contrast. Angiographic images of the neck were obtained using MRA technique without and with intravenous contrast. Carotid stenosis measurements (when applicable) are obtained utilizing NASCET criteria, using the distal internal carotid diameter as the denominator. CONTRAST:  10mL GADAVIST GADOBUTROL 1 MMOL/ML IV SOLN COMPARISON:  Plain head CT 0156 hours today and earlier. FINDINGS: MRI HEAD FINDINGS Brain: Cerebral volume is within normal limits for age. No midline shift, mass effect, evidence of mass lesion, ventriculomegaly, extra-axial collection or acute intracranial hemorrhage. Cervicomedullary junction and pituitary are within normal limits. Confluent small area of restricted diffusion in the right medullary pyramid, tracking into the paracentral dorsal medulla on series 5, image 57. Faint associated T2 and FLAIR hyperintensity. No acute brainstem hemorrhage. No mass effect. No other restricted diffusion. Small chronic infarct infarct in the posterior right cerebellum also on series 8, image 6. And evidence of a chronic hemorrhage in the left midbrain on series 9, image 29. But otherwise largely normal for age gray and white matter signal in the brain. Mild for age superior frontal lobe nonspecific white matter T2 and FLAIR hyperintensity. No other chronic cerebral blood products identified. No cerebral cortical encephalomalacia. Vascular: Major intracranial vascular flow voids are preserved, the distal left vertebral artery appears dominant, but see MRA findings below. Skull and upper cervical spine: Negative for age visible cervical spine. Visualized bone marrow signal is within normal limits. Sinuses/Orbits: Postoperative changes to both globes. Otherwise negative orbits. Paranasal Visualized paranasal sinuses and mastoids are clear.  Other: Visible internal auditory structures appear normal. Negative visible scalp and face. MRA NECK FINDINGS Precontrast time-of-flight images reveal a 3 vessel arch configuration with antegrade flow in the bilateral cervical carotid and vertebral arteries throughout the neck. The left vertebral artery appears dominant and the right somewhat diminutive throughout. Both carotid bifurcations are patent. Post-contrast neck MRA images. Proximal great vessels appear within normal limits. Right CCA is patent without stenosis, mild tortuosity. The right carotid bifurcation there is mostly ECA origin irregularity (series 101, image 10). No cervical right ICA stenosis. Right ICA siphon and terminus also appear within normal limits. Mildly to moderately tortuous left CCA with no stenosis. Capacious left carotid bifurcation. Cervical left ICA and also visible left ICA siphon and terminus appear patent without stenosis. No proximal subclavian artery stenosis identified, tortuous proximal right subclavian artery. Normal left vertebral artery origin with tortuous V1 segment. Dominant left vertebral artery without stenosis to the basilar. Diminutive right vertebral  artery is patent and enhancing throughout the neck with no convincing extracranial stenosis. However, the distal right V4 segment appears beaded and irregular on series 104, image 5 MRA HEAD FINDINGS Intermittently degraded by mild motion artifact. And accidentally the distal vertebral arteries and proximal basilar were not imaged. Visible basilar artery is patent with mild irregularity. Basilar tip, SCA and PCA origins are normal. Posterior communicating arteries are diminutive or absent. Bilateral PCA branches are within normal limits. Visible ICA siphons are patent without stenosis. Patent carotid termini, MCA and ACA origins. Codominant A1 segments. MCA M1 segments and bifurcations appear patent. But other MCA and ACA branch detail is degraded by motion.  IMPRESSION: 1. Small acute infarct in the Right Medullary Pyramid (medullary perforator artery territory, see #2). No associated acute hemorrhage or mass effect. 2. 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 No large vessel occlusion. 3. Intracranial MRA is degraded.  No large vessel occlusion. 4. Small chronic infarct in the right cerebellum, and evidence of a chronic micro-hemorrhage in the left midbrain. Mild for age superimposed cerebral white matter signal changes, most commonly due to small vessel disease. Electronically Signed   By: Odessa Fleming M.D.   On: 02/04/2022 09:53   MR Angiogram Neck W or Wo Contrast  Result Date: 02/04/2022 CLINICAL DATA:  66 year old male left side numbness, dizziness and abnormal gait. EXAM: MRI HEAD WITHOUT CONTRAST MRA HEAD WITHOUT CONTRAST MRA NECK WITHOUT AND WITH CONTRAST TECHNIQUE: Multiplanar, multiecho pulse sequences of the brain and surrounding structures were obtained without intravenous contrast. Angiographic images of the Circle of Willis were obtained using MRA technique without intravenous contrast. Angiographic images of the neck were obtained using MRA technique without and with intravenous contrast. Carotid stenosis measurements (when applicable) are obtained utilizing NASCET criteria, using the distal internal carotid diameter as the denominator. CONTRAST:  10mL GADAVIST GADOBUTROL 1 MMOL/ML IV SOLN COMPARISON:  Plain head CT 0156 hours today and earlier. FINDINGS: MRI HEAD FINDINGS Brain: Cerebral volume is within normal limits for age. No midline shift, mass effect, evidence of mass lesion, ventriculomegaly, extra-axial collection or acute intracranial hemorrhage. Cervicomedullary junction and pituitary are within normal limits. Confluent small area of restricted diffusion in the right medullary pyramid, tracking into the paracentral dorsal  medulla on series 5, image 57. Faint associated T2 and FLAIR hyperintensity. No acute brainstem hemorrhage. No mass effect. No other restricted diffusion. Small chronic infarct infarct in the posterior right cerebellum also on series 8, image 6. And evidence of a chronic hemorrhage in the left midbrain on series 9, image 29. But otherwise largely normal for age gray and white matter signal in the brain. Mild for age superior frontal lobe nonspecific white matter T2 and FLAIR hyperintensity. No other chronic cerebral blood products identified. No cerebral cortical encephalomalacia. Vascular: Major intracranial vascular flow voids are preserved, the distal left vertebral artery appears dominant, but see MRA findings below. Skull and upper cervical spine: Negative for age visible cervical spine. Visualized bone marrow signal is within normal limits. Sinuses/Orbits: Postoperative changes to both globes. Otherwise negative orbits. Paranasal Visualized paranasal sinuses and mastoids are clear. Other: Visible internal auditory structures appear normal. Negative visible scalp and face. MRA NECK FINDINGS Precontrast time-of-flight images reveal a 3 vessel arch configuration with antegrade flow in the bilateral cervical carotid and vertebral arteries throughout the neck. The left vertebral artery appears dominant and the right somewhat diminutive throughout. Both  carotid bifurcations are patent. Post-contrast neck MRA images. Proximal great vessels appear within normal limits. Right CCA is patent without stenosis, mild tortuosity. The right carotid bifurcation there is mostly ECA origin irregularity (series 101, image 10). No cervical right ICA stenosis. Right ICA siphon and terminus also appear within normal limits. Mildly to moderately tortuous left CCA with no stenosis. Capacious left carotid bifurcation. Cervical left ICA and also visible left ICA siphon and terminus appear patent without stenosis. No proximal subclavian  artery stenosis identified, tortuous proximal right subclavian artery. Normal left vertebral artery origin with tortuous V1 segment. Dominant left vertebral artery without stenosis to the basilar. Diminutive right vertebral artery is patent and enhancing throughout the neck with no convincing extracranial stenosis. However, the distal right V4 segment appears beaded and irregular on series 104, image 5 MRA HEAD FINDINGS Intermittently degraded by mild motion artifact. And accidentally the distal vertebral arteries and proximal basilar were not imaged. Visible basilar artery is patent with mild irregularity. Basilar tip, SCA and PCA origins are normal. Posterior communicating arteries are diminutive or absent. Bilateral PCA branches are within normal limits. Visible ICA siphons are patent without stenosis. Patent carotid termini, MCA and ACA origins. Codominant A1 segments. MCA M1 segments and bifurcations appear patent. But other MCA and ACA branch detail is degraded by motion. IMPRESSION: 1. Small acute infarct in the Right Medullary Pyramid (medullary perforator artery territory, see #2). No associated acute hemorrhage or mass effect. 2. 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 No large vessel occlusion. 3. Intracranial MRA is degraded.  No large vessel occlusion. 4. Small chronic infarct in the right cerebellum, and evidence of a chronic micro-hemorrhage in the left midbrain. Mild for age superimposed cerebral white matter signal changes, most commonly due to small vessel disease. Electronically Signed   By: Odessa Fleming M.D.   On: 02/04/2022 09:53   MR BRAIN WO CONTRAST  Result Date: 02/04/2022 CLINICAL DATA:  66 year old male left side numbness, dizziness and abnormal gait. EXAM: MRI HEAD WITHOUT CONTRAST MRA HEAD WITHOUT CONTRAST MRA NECK WITHOUT AND WITH CONTRAST TECHNIQUE: Multiplanar,  multiecho pulse sequences of the brain and surrounding structures were obtained without intravenous contrast. Angiographic images of the Circle of Willis were obtained using MRA technique without intravenous contrast. Angiographic images of the neck were obtained using MRA technique without and with intravenous contrast. Carotid stenosis measurements (when applicable) are obtained utilizing NASCET criteria, using the distal internal carotid diameter as the denominator. CONTRAST:  10mL GADAVIST GADOBUTROL 1 MMOL/ML IV SOLN COMPARISON:  Plain head CT 0156 hours today and earlier. FINDINGS: MRI HEAD FINDINGS Brain: Cerebral volume is within normal limits for age. No midline shift, mass effect, evidence of mass lesion, ventriculomegaly, extra-axial collection or acute intracranial hemorrhage. Cervicomedullary junction and pituitary are within normal limits. Confluent small area of restricted diffusion in the right medullary pyramid, tracking into the paracentral dorsal medulla on series 5, image 57. Faint associated T2 and FLAIR hyperintensity. No acute brainstem hemorrhage. No mass effect. No other restricted diffusion. Small chronic infarct infarct in the posterior right cerebellum also on series 8, image 6. And evidence of a chronic hemorrhage in the left midbrain on series 9, image 29. But otherwise largely normal for age gray and white matter signal in the brain. Mild for age superior frontal lobe nonspecific white matter T2 and FLAIR hyperintensity. No other chronic cerebral blood products identified. No cerebral  cortical encephalomalacia. Vascular: Major intracranial vascular flow voids are preserved, the distal left vertebral artery appears dominant, but see MRA findings below. Skull and upper cervical spine: Negative for age visible cervical spine. Visualized bone marrow signal is within normal limits. Sinuses/Orbits: Postoperative changes to both globes. Otherwise negative orbits. Paranasal Visualized  paranasal sinuses and mastoids are clear. Other: Visible internal auditory structures appear normal. Negative visible scalp and face. MRA NECK FINDINGS Precontrast time-of-flight images reveal a 3 vessel arch configuration with antegrade flow in the bilateral cervical carotid and vertebral arteries throughout the neck. The left vertebral artery appears dominant and the right somewhat diminutive throughout. Both carotid bifurcations are patent. Post-contrast neck MRA images. Proximal great vessels appear within normal limits. Right CCA is patent without stenosis, mild tortuosity. The right carotid bifurcation there is mostly ECA origin irregularity (series 101, image 10). No cervical right ICA stenosis. Right ICA siphon and terminus also appear within normal limits. Mildly to moderately tortuous left CCA with no stenosis. Capacious left carotid bifurcation. Cervical left ICA and also visible left ICA siphon and terminus appear patent without stenosis. No proximal subclavian artery stenosis identified, tortuous proximal right subclavian artery. Normal left vertebral artery origin with tortuous V1 segment. Dominant left vertebral artery without stenosis to the basilar. Diminutive right vertebral artery is patent and enhancing throughout the neck with no convincing extracranial stenosis. However, the distal right V4 segment appears beaded and irregular on series 104, image 5 MRA HEAD FINDINGS Intermittently degraded by mild motion artifact. And accidentally the distal vertebral arteries and proximal basilar were not imaged. Visible basilar artery is patent with mild irregularity. Basilar tip, SCA and PCA origins are normal. Posterior communicating arteries are diminutive or absent. Bilateral PCA branches are within normal limits. Visible ICA siphons are patent without stenosis. Patent carotid termini, MCA and ACA origins. Codominant A1 segments. MCA M1 segments and bifurcations appear patent. But other MCA and ACA  branch detail is degraded by motion. IMPRESSION: 1. Small acute infarct in the Right Medullary Pyramid (medullary perforator artery territory, see #2). No associated acute hemorrhage or mass effect. 2. 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 No large vessel occlusion. 3. Intracranial MRA is degraded.  No large vessel occlusion. 4. Small chronic infarct in the right cerebellum, and evidence of a chronic micro-hemorrhage in the left midbrain. Mild for age superimposed cerebral white matter signal changes, most commonly due to small vessel disease. Electronically Signed   By: Odessa Fleming M.D.   On: 02/04/2022 09:53   US RENAL  Result Date: 02/04/2022 CLINICAL DATA:  Acute renal insufficiency EXAM: RENAL / URINARY TRACT ULTRASOUND COMPLETE COMPARISON:  07/07/2021 FINDINGS: Right Kidney: Renal measurements: 11.0 x 4.8 x 5.6 cm = volume: 152 mL. Renal cortical echogenicity is diffusely increased suggesting changes of underlying medical renal disease. Renal cortical thickness is preserved. No hydronephrosis. No solid intrarenal masses or calcifications are seen. 13 mm simple cortical cyst noted within the interpolar region. Left Kidney: Renal measurements: 12.4 x 5.7 x 6.8 cm = volume: 250 mL. Renal cortical echogenicity is diffusely mildly increased in keeping with changes of underlying medical renal disease. Renal cortical thickness is preserved. No hydronephrosis. No solid intrarenal masses or calcifications. Multiple simple cortical cysts or scar within the interpolar region and lower pole of the left kidney measuring up to 23 mm within the lower pole Bladder: Appears normal for degree of bladder distention.  Other: Prostate gland is mildly enlarged and indents the base of the bladder. IMPRESSION: Diffusely increased renal cortical echogenicity in keeping with changes of underlying medical renal  disease. Multiple simple cortical cysts noted bilaterally. No further follow-up is required. Mild prostatic enlargement. Electronically Signed   By: Helyn Numbers M.D.   On: 02/04/2022 19:41     LOS: 1 day   Jeoffrey Massed, MD  [facility]    To contact the attending provider between 7A-7P or the covering provider during after hours 7P-7A, please log into the web site [email or web address] and access using universal [place] password for that web site. If you do not have the password, please call the hospital operator.  02/05/2022, 1:25 PM

## 2022-02-05 NOTE — Evaluation (Signed)
Physical Therapy Evaluation ?Patient Details ?Name: Edwin [name] ?MRN: [id number] ?DOB: [birth year] ?Today's Date: 02/05/2022 ? ?History of Present Illness ? Edwin [name] is a 66 y.o. male presenting with L sided numbness, dizziness, and fall at home. 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 PMH: DM2, HTN, hypogonadism, bipolar, gout, CKD, and suicide attempt (2015).  ?Clinical Impression ? Pt admitted with above diagnosis. Pt with impaired gait due to left hemibody numbness. Pt with decr balance without device and cues and min guard assist with device. Did not use device PTA.  Pt has support for a short time when he goes home. Pt reports he could use wheelchair in the home as well if he needed to. REcommend AIR prior to d/c home so that he can maximize his independence prior to d/c home.   Pt currently with functional limitations due to the deficits listed below (see PT Problem List). Pt will benefit from skilled PT to increase their independence and safety with mobility to allow discharge to the venue listed below.      ?   ? ?Recommendations for follow up therapy are one component of a multi-disciplinary discharge planning process, led by the attending physician.  Recommendations may be updated based on patient status, additional functional criteria and insurance authorization. ? ?Follow Up Recommendations Acute inpatient rehab (3hours/day) ? ?  ?Assistance Recommended at Discharge Set up Supervision/Assistance  ?Patient can return home with the following ? A little help with walking and/or transfers;A little help with bathing/dressing/bathroom;Help with stairs or ramp for entrance ? ?  ?Equipment Recommendations Rolling walker (2 wheels);BSC/3in1;Wheelchair (measurements PT);Wheelchair cushion (measurements PT)  ?Recommendations for Other Services ?    ?  ?Functional Status Assessment Patient has had a  recent decline in their functional status and demonstrates the ability to make significant improvements in function in a reasonable and predictable amount of time.  ? ?  ?Precautions / Restrictions Precautions ?Precautions: Fall;Other (comment) ?Precaution Comments: monitor BP ?Restrictions ?Weight Bearing Restrictions: No  ? ?  ? ?Mobility ? Bed Mobility ?Overal bed mobility: Modified Independent ?  ?  ?  ?  ?  ?  ?  ?  ? ?Transfers ?Overall transfer level: Needs assistance ?Equipment used: None, Rolling walker (2 wheels) ?Transfers: Sit to/from Stand ?Sit to Stand: Min guard ?  ?  ?  ?  ?  ?General transfer comment: appeared more confident with use of RW in standing. ?  ? ?Ambulation/Gait ?Ambulation/Gait assistance: Min guard, Min assist ?Gait Distance (Feet): 200 Feet ?Assistive device: Rolling walker (2 wheels), None ?Gait Pattern/deviations: Decreased step length - left, Decreased weight shift to left, Drifts right/left ?  ?Gait velocity interpretation: 1.31 - 2.62 ft/sec, indicative of limited community ambulator ?  ?General Gait Details: Pt tends to drift to left when not using RW and tends to want to hold onto objects in room. With RW, safety improves as well as balance.  Pt states it is difficult to have the tingling all the time in the right hemibody. ? ?Stairs ?  ?  ?  ?  ?  ? ?Wheelchair Mobility ?  ? ?Modified Rankin (Stroke Patients Only) ?  ? ?  ? ?Balance Overall balance assessment: Needs assistance ?Sitting-balance support: No upper extremity supported, Feet supported ?Sitting balance-Leahy Scale: Good ?  ?  ?Standing balance support: No upper extremity supported, During functional activity ?Standing balance-Leahy Scale: Poor ?Standing  balance comment: able to stand statically without support, benefits from 1-2 UE support during mobility to maintain balance. Pt feels tendency to drift to L side ?  ?  ?  ?  ?  ?  ?  ?  ?  ?  ?  ?   ? ? ? ?Pertinent Vitals/Pain Pain Assessment ?Pain Assessment:  No/denies pain ?Faces Pain Scale: No hurt  ? ? ?Home Living Family/patient expects to be discharged to:: Private residence ?Living Arrangements: Alone ?Available Help at Discharge: Friend(s);Neighbor;Available PRN/intermittently ?Type of Home: House ?Home Access: Stairs to enter ?Entrance Stairs-Rails: Right;Left ?Entrance Stairs-Number of Steps: 3-4 ?  ?Home Layout: One level ?Home Equipment: Grab bars - tub/shower ?Additional Comments: Pt recently married but wife lives overseas, working on immigration to [place]  ?  ?Prior Function Prior Level of Function : Independent/Modified Independent;Working/employed;Driving ?  ?  ?  ?  ?  ?  ?Mobility Comments: no use of AD. reports a couple of falls (rolled out of bed, fell asleep in desk chair with chair coming out from under him) ?ADLs Comments: Works as Engineer, agricultural, Independent with ADLs, IADLs, drives, grocery shops and goes out to eat a lot ?  ? ? ?Hand Dominance  ? Dominant Hand: Left ? ?  ?Extremity/Trunk Assessment  ? Upper Extremity Assessment ?Upper Extremity Assessment: Defer to OT evaluation ?LUE Deficits / Details: diminished sensation; feels tingly/"asleep" per pt. able to grasp items without dropping them. strength WFL for dominant UE ?LUE Sensation: decreased light touch ?LUE Coordination: WNL ?  ? ?Lower Extremity Assessment ?Lower Extremity Assessment: LLE deficits/detail ?LLE Deficits / Details: grossly 3+/5 ?LLE Sensation: decreased light touch ?  ? ?Cervical / Trunk Assessment ?Cervical / Trunk Assessment: Normal  ?Communication  ? Communication: No difficulties  ?Cognition Arousal/Alertness: Awake/alert ?Behavior During Therapy: [facility] for tasks assessed/performed ?Overall Cognitive Status: Impaired/Different from baseline ?Area of Impairment: Memory, Awareness ?  ?  ?  ?  ?  ?  ?  ?  ?  ?  ?Memory: Decreased short-term memory ?  ?  ?  ?  ?General Comments: hx of bipolar.  Repeated himself a few times during session telling me information that he had just  told me and did not seem aware that he did so. ?  ?  ? ?  ?General Comments General comments (skin integrity, edema, etc.): VSS on RA ? ?  ?Exercises    ? ?Assessment/Plan  ?  ?PT Assessment Patient needs continued PT services  ?PT Problem List Decreased activity tolerance;Decreased balance;Decreased mobility;Decreased knowledge of use of DME;Decreased safety awareness;Decreased knowledge of precautions;Impaired sensation ? ?   ?  ?PT Treatment Interventions DME instruction;Gait training;Functional mobility training;Therapeutic activities;Therapeutic exercise;Balance training;Patient/family education;Stair training;Wheelchair mobility training   ? ?PT Goals (Current goals can be found in the Care Plan section)  ?Acute Rehab PT Goals ?Patient Stated Goal: to go home ?PT Goal Formulation: With patient ?Time For Goal Achievement: 02/19/22 ?Potential to Achieve Goals: Good ? ?  ?Frequency Min 4X/week ?  ? ? ?Co-evaluation   ?  ?  ?  ?  ? ? ?  ?AM-PAC PT "6 Clicks" Mobility  ?Outcome Measure Help needed turning from your back to your side while in a flat bed without using bedrails?: A Little ?Help needed moving from lying on your back to sitting on the side of a flat bed without using bedrails?: A Little ?Help needed moving to and from a bed to a chair (including a wheelchair)?: A Little ?Help needed  standing up from a chair using your arms (e.g., wheelchair or bedside chair)?: A Little ?Help needed to walk in hospital room?: A Lot ?Help needed climbing 3-5 steps with a railing? : A Lot ?6 Click Score: 16 ? ?  ?End of Session Equipment Utilized During Treatment: Gait belt ?Activity Tolerance: Patient limited by fatigue ?Patient left: in chair;with call bell/phone within reach;with chair alarm set ?Nurse Communication: Mobility status ?PT Visit Diagnosis: Unsteadiness on feet (R26.81);Other symptoms and signs involving the nervous system (R29.898) ?  ? ?Time: 7903-8333 ?PT Time Calculation (min) (ACUTE ONLY): 33  min ? ? ?Charges:   PT Evaluation ?$PT Eval Moderate Complexity: 1 Mod ?PT Treatments ?$Gait Training: 8-22 mins ?  ?   ? ? ?Mathieu Schloemer M,PT ?Acute Rehab Services ?[phone] ?[phone] (pager)  ? ?Lodie Waheed F Sarita Hakanson ?3/2

## 2022-02-05 NOTE — Plan of Care (Signed)

## 2022-02-05 NOTE — Progress Notes (Signed)
Spoke with patient-explained PT recommendations-he does not want to go to CIR-he wants to go home with home health services.  He has arranged for numerous family members to be with him at home.  We will discharge patient home at his own request.  See discharge summary. ?

## 2022-02-05 NOTE — TOC Transition Note (Signed)
Transition of Care (TOC) - CM/SW Discharge Note ? ? ?Patient Details  ?Name: Edwin [name] ?MRN: [id number] ?Date of Birth: August 14, 1956 ? ?Transition of Care (TOC) CM/SW Contact:  ?Harriet Masson, RN ?Phone Number: ?02/05/2022, 2:27 PM ? ? ?Clinical Narrative:    ?Patient stable for discharge. ?Orders for [facility] and DME. ?Patient defers to [facility] to find home health agency. ?Spoke to [place] with [facility] and referral aceepted ?Patient agreeable to use in house provider [facility] for DME. ?Dan Humphreys will be delivered to room prior to discharge.  ?Address, Phone number and PCP verified.  ? ?Final next level of care: Home w Home Health Services ?Barriers to Discharge: Barriers Resolved ? ? ?Patient Goals and CMS Choice ?Patient states their goals for this hospitalization and ongoing recovery are:: go home ?CMS Medicare.gov Compare Post Acute Care list provided to:: Patient ?Choice offered to / list presented to : Patient ? ?Discharge Placement ?  ?           ? home ?  ?  ?  ? ?Discharge Plan and Services ?  ?Discharge Planning Services: CM Consult ?Post Acute Care Choice: Home Health, Durable Medical Equipment          ?DME Arranged: Walker rolling ?DME Agency: [facility] ?Date DME Agency Contacted: 02/05/22 ?Time DME Agency Contacted: 1405 ?Representative spoke with at DME Agency: Velna Hatchet ?HH Arranged: PT, OT ?HH Agency: [facility] ?Date HH Agency Contacted: 02/05/22 ?Time HH Agency Contacted: 1427 ?Representative spoke with at [facility] Agency: Clifton Custard ? ?Social Determinants of Health (SDOH) Interventions ?  ? ? ?Readmission Risk Interventions ?Readmission Risk Prevention Plan 02/05/2022  ?Post Dischage Appt Complete  ?Medication Screening Complete  ?Transportation Screening Complete  ?Some recent data might be hidden  ? ? ? ? ? ?

## 2022-02-07 DIAGNOSIS — I10 Essential (primary) hypertension: Secondary | ICD-10-CM | POA: Diagnosis not present

## 2022-02-08 DIAGNOSIS — I6350 Cerebral infarction due to unspecified occlusion or stenosis of unspecified cerebral artery: Secondary | ICD-10-CM | POA: Diagnosis not present

## 2022-02-09 DIAGNOSIS — Z7984 Long term (current) use of oral hypoglycemic drugs: Secondary | ICD-10-CM | POA: Diagnosis not present

## 2022-02-09 DIAGNOSIS — N179 Acute kidney failure, unspecified: Secondary | ICD-10-CM | POA: Diagnosis not present

## 2022-02-09 DIAGNOSIS — G4733 Obstructive sleep apnea (adult) (pediatric): Secondary | ICD-10-CM | POA: Diagnosis not present

## 2022-02-09 DIAGNOSIS — E291 Testicular hypofunction: Secondary | ICD-10-CM | POA: Diagnosis not present

## 2022-02-09 DIAGNOSIS — R739 Hyperglycemia, unspecified: Secondary | ICD-10-CM | POA: Diagnosis not present

## 2022-02-09 DIAGNOSIS — R972 Elevated prostate specific antigen [PSA]: Secondary | ICD-10-CM | POA: Diagnosis not present

## 2022-02-09 DIAGNOSIS — N1832 Chronic kidney disease, stage 3b: Secondary | ICD-10-CM | POA: Diagnosis not present

## 2022-02-09 DIAGNOSIS — R2689 Other abnormalities of gait and mobility: Secondary | ICD-10-CM | POA: Diagnosis not present

## 2022-02-09 DIAGNOSIS — I639 Cerebral infarction, unspecified: Secondary | ICD-10-CM | POA: Diagnosis not present

## 2022-02-09 DIAGNOSIS — Z7901 Long term (current) use of anticoagulants: Secondary | ICD-10-CM | POA: Diagnosis not present

## 2022-02-09 DIAGNOSIS — I69354 Hemiplegia and hemiparesis following cerebral infarction affecting left non-dominant side: Secondary | ICD-10-CM | POA: Diagnosis not present

## 2022-02-09 DIAGNOSIS — E1122 Type 2 diabetes mellitus with diabetic chronic kidney disease: Secondary | ICD-10-CM | POA: Diagnosis not present

## 2022-02-09 DIAGNOSIS — M109 Gout, unspecified: Secondary | ICD-10-CM | POA: Diagnosis not present

## 2022-02-09 DIAGNOSIS — Z9181 History of falling: Secondary | ICD-10-CM | POA: Diagnosis not present

## 2022-02-09 DIAGNOSIS — E669 Obesity, unspecified: Secondary | ICD-10-CM | POA: Diagnosis not present

## 2022-02-09 DIAGNOSIS — I129 Hypertensive chronic kidney disease with stage 1 through stage 4 chronic kidney disease, or unspecified chronic kidney disease: Secondary | ICD-10-CM | POA: Diagnosis not present

## 2022-02-09 DIAGNOSIS — J309 Allergic rhinitis, unspecified: Secondary | ICD-10-CM | POA: Diagnosis not present

## 2022-02-09 DIAGNOSIS — F319 Bipolar disorder, unspecified: Secondary | ICD-10-CM | POA: Diagnosis not present

## 2022-02-09 DIAGNOSIS — G4734 Idiopathic sleep related nonobstructive alveolar hypoventilation: Secondary | ICD-10-CM | POA: Diagnosis not present

## 2022-02-09 DIAGNOSIS — Z6831 Body mass index (BMI) 31.0-31.9, adult: Secondary | ICD-10-CM | POA: Diagnosis not present

## 2022-02-12 DIAGNOSIS — Z713 Dietary counseling and surveillance: Secondary | ICD-10-CM | POA: Diagnosis not present

## 2022-02-14 DIAGNOSIS — I129 Hypertensive chronic kidney disease with stage 1 through stage 4 chronic kidney disease, or unspecified chronic kidney disease: Secondary | ICD-10-CM | POA: Diagnosis not present

## 2022-02-14 DIAGNOSIS — J309 Allergic rhinitis, unspecified: Secondary | ICD-10-CM | POA: Diagnosis not present

## 2022-02-14 DIAGNOSIS — N179 Acute kidney failure, unspecified: Secondary | ICD-10-CM | POA: Diagnosis not present

## 2022-02-14 DIAGNOSIS — Z7901 Long term (current) use of anticoagulants: Secondary | ICD-10-CM | POA: Diagnosis not present

## 2022-02-14 DIAGNOSIS — Z6831 Body mass index (BMI) 31.0-31.9, adult: Secondary | ICD-10-CM | POA: Diagnosis not present

## 2022-02-14 DIAGNOSIS — M109 Gout, unspecified: Secondary | ICD-10-CM | POA: Diagnosis not present

## 2022-02-14 DIAGNOSIS — E669 Obesity, unspecified: Secondary | ICD-10-CM | POA: Diagnosis not present

## 2022-02-14 DIAGNOSIS — N1832 Chronic kidney disease, stage 3b: Secondary | ICD-10-CM | POA: Diagnosis not present

## 2022-02-14 DIAGNOSIS — Z9181 History of falling: Secondary | ICD-10-CM | POA: Diagnosis not present

## 2022-02-14 DIAGNOSIS — E1122 Type 2 diabetes mellitus with diabetic chronic kidney disease: Secondary | ICD-10-CM | POA: Diagnosis not present

## 2022-02-14 DIAGNOSIS — G4733 Obstructive sleep apnea (adult) (pediatric): Secondary | ICD-10-CM | POA: Diagnosis not present

## 2022-02-14 DIAGNOSIS — G4734 Idiopathic sleep related nonobstructive alveolar hypoventilation: Secondary | ICD-10-CM | POA: Diagnosis not present

## 2022-02-14 DIAGNOSIS — F319 Bipolar disorder, unspecified: Secondary | ICD-10-CM | POA: Diagnosis not present

## 2022-02-14 DIAGNOSIS — E291 Testicular hypofunction: Secondary | ICD-10-CM | POA: Diagnosis not present

## 2022-02-14 DIAGNOSIS — Z7984 Long term (current) use of oral hypoglycemic drugs: Secondary | ICD-10-CM | POA: Diagnosis not present

## 2022-02-14 DIAGNOSIS — I69354 Hemiplegia and hemiparesis following cerebral infarction affecting left non-dominant side: Secondary | ICD-10-CM | POA: Diagnosis not present

## 2022-02-16 DIAGNOSIS — F319 Bipolar disorder, unspecified: Secondary | ICD-10-CM | POA: Diagnosis not present

## 2022-02-16 DIAGNOSIS — N1832 Chronic kidney disease, stage 3b: Secondary | ICD-10-CM | POA: Diagnosis not present

## 2022-02-16 DIAGNOSIS — Z7984 Long term (current) use of oral hypoglycemic drugs: Secondary | ICD-10-CM | POA: Diagnosis not present

## 2022-02-16 DIAGNOSIS — M109 Gout, unspecified: Secondary | ICD-10-CM | POA: Diagnosis not present

## 2022-02-16 DIAGNOSIS — E291 Testicular hypofunction: Secondary | ICD-10-CM | POA: Diagnosis not present

## 2022-02-16 DIAGNOSIS — I129 Hypertensive chronic kidney disease with stage 1 through stage 4 chronic kidney disease, or unspecified chronic kidney disease: Secondary | ICD-10-CM | POA: Diagnosis not present

## 2022-02-16 DIAGNOSIS — Z9181 History of falling: Secondary | ICD-10-CM | POA: Diagnosis not present

## 2022-02-16 DIAGNOSIS — Z7901 Long term (current) use of anticoagulants: Secondary | ICD-10-CM | POA: Diagnosis not present

## 2022-02-16 DIAGNOSIS — J309 Allergic rhinitis, unspecified: Secondary | ICD-10-CM | POA: Diagnosis not present

## 2022-02-16 DIAGNOSIS — N179 Acute kidney failure, unspecified: Secondary | ICD-10-CM | POA: Diagnosis not present

## 2022-02-16 DIAGNOSIS — E1122 Type 2 diabetes mellitus with diabetic chronic kidney disease: Secondary | ICD-10-CM | POA: Diagnosis not present

## 2022-02-16 DIAGNOSIS — E669 Obesity, unspecified: Secondary | ICD-10-CM | POA: Diagnosis not present

## 2022-02-16 DIAGNOSIS — G4733 Obstructive sleep apnea (adult) (pediatric): Secondary | ICD-10-CM | POA: Diagnosis not present

## 2022-02-16 DIAGNOSIS — I69354 Hemiplegia and hemiparesis following cerebral infarction affecting left non-dominant side: Secondary | ICD-10-CM | POA: Diagnosis not present

## 2022-02-16 DIAGNOSIS — Z6831 Body mass index (BMI) 31.0-31.9, adult: Secondary | ICD-10-CM | POA: Diagnosis not present

## 2022-02-19 DIAGNOSIS — G4733 Obstructive sleep apnea (adult) (pediatric): Secondary | ICD-10-CM | POA: Diagnosis not present

## 2022-02-19 DIAGNOSIS — Z9181 History of falling: Secondary | ICD-10-CM | POA: Diagnosis not present

## 2022-02-19 DIAGNOSIS — I69354 Hemiplegia and hemiparesis following cerebral infarction affecting left non-dominant side: Secondary | ICD-10-CM | POA: Diagnosis not present

## 2022-02-19 DIAGNOSIS — I129 Hypertensive chronic kidney disease with stage 1 through stage 4 chronic kidney disease, or unspecified chronic kidney disease: Secondary | ICD-10-CM | POA: Diagnosis not present

## 2022-02-19 DIAGNOSIS — E1122 Type 2 diabetes mellitus with diabetic chronic kidney disease: Secondary | ICD-10-CM | POA: Diagnosis not present

## 2022-02-19 DIAGNOSIS — F319 Bipolar disorder, unspecified: Secondary | ICD-10-CM | POA: Diagnosis not present

## 2022-02-19 DIAGNOSIS — N1832 Chronic kidney disease, stage 3b: Secondary | ICD-10-CM | POA: Diagnosis not present

## 2022-02-19 DIAGNOSIS — E291 Testicular hypofunction: Secondary | ICD-10-CM | POA: Diagnosis not present

## 2022-02-19 DIAGNOSIS — J309 Allergic rhinitis, unspecified: Secondary | ICD-10-CM | POA: Diagnosis not present

## 2022-02-19 DIAGNOSIS — E669 Obesity, unspecified: Secondary | ICD-10-CM | POA: Diagnosis not present

## 2022-02-19 DIAGNOSIS — Z6831 Body mass index (BMI) 31.0-31.9, adult: Secondary | ICD-10-CM | POA: Diagnosis not present

## 2022-02-19 DIAGNOSIS — Z7901 Long term (current) use of anticoagulants: Secondary | ICD-10-CM | POA: Diagnosis not present

## 2022-02-19 DIAGNOSIS — N179 Acute kidney failure, unspecified: Secondary | ICD-10-CM | POA: Diagnosis not present

## 2022-02-19 DIAGNOSIS — M109 Gout, unspecified: Secondary | ICD-10-CM | POA: Diagnosis not present

## 2022-02-19 DIAGNOSIS — Z7984 Long term (current) use of oral hypoglycemic drugs: Secondary | ICD-10-CM | POA: Diagnosis not present

## 2022-02-20 ENCOUNTER — Other Ambulatory Visit: Payer: Self-pay | Admitting: *Deleted

## 2022-02-20 NOTE — Patient Outreach (Signed)
Received a red flag Emmi stroke notification for Mr. Quinter . ?I have assigned Marval Regal, RN to call for follow up and determine if there are any Case Management needs.  ?  ?Iverson Alamin, CBCS, CMAA ?[facility] Care Management Assistant ?[facility] Care Management ?[phone]   ?

## 2022-02-20 NOTE — Patient Outreach (Signed)
[facility] Customer service manager [facility]) Care Management ? ?02/20/2022 ? ?[name] ?1956-02-13 ?[id number] ? ? ?[facility] Unsuccessful outreach#1 ? ? ?Mr NEEV MCMAINS was referred to [facility] after responses to questions during an automated EMMI stroke outreach post hospital stay. ?Questions:  Questions/problems with meds? yes  ?Did you feel more comfortable talking to doctor? no ? ?Outreach attempt to the listed at the preferred outreach number in EPIC 336 [phone] ?No answer. [facility] RN CM left HIPAA [facility] Portability and Accountability Act) compliant voicemail message along with CM?s contact info.  ? ?Plan: ?[facility] RN CM scheduled this patient for another call attempt within 4-7 business days ?Unsuccessful outreach letter sent on 02/20/22  ?Unsuccessful outreach on 02/20/22 ? ? ?Wilberta Dorvil L. Noelle Penner, RN, BSN, CCM ?[facility] Telephonic Care Management Care Coordinator ?Office number [phone] ? ?

## 2022-02-21 DIAGNOSIS — G4733 Obstructive sleep apnea (adult) (pediatric): Secondary | ICD-10-CM | POA: Diagnosis not present

## 2022-02-21 DIAGNOSIS — I129 Hypertensive chronic kidney disease with stage 1 through stage 4 chronic kidney disease, or unspecified chronic kidney disease: Secondary | ICD-10-CM | POA: Diagnosis not present

## 2022-02-21 DIAGNOSIS — M109 Gout, unspecified: Secondary | ICD-10-CM | POA: Diagnosis not present

## 2022-02-21 DIAGNOSIS — Z7984 Long term (current) use of oral hypoglycemic drugs: Secondary | ICD-10-CM | POA: Diagnosis not present

## 2022-02-21 DIAGNOSIS — J309 Allergic rhinitis, unspecified: Secondary | ICD-10-CM | POA: Diagnosis not present

## 2022-02-21 DIAGNOSIS — Z6831 Body mass index (BMI) 31.0-31.9, adult: Secondary | ICD-10-CM | POA: Diagnosis not present

## 2022-02-21 DIAGNOSIS — N1832 Chronic kidney disease, stage 3b: Secondary | ICD-10-CM | POA: Diagnosis not present

## 2022-02-21 DIAGNOSIS — Z9181 History of falling: Secondary | ICD-10-CM | POA: Diagnosis not present

## 2022-02-21 DIAGNOSIS — E1122 Type 2 diabetes mellitus with diabetic chronic kidney disease: Secondary | ICD-10-CM | POA: Diagnosis not present

## 2022-02-21 DIAGNOSIS — E669 Obesity, unspecified: Secondary | ICD-10-CM | POA: Diagnosis not present

## 2022-02-21 DIAGNOSIS — Z7901 Long term (current) use of anticoagulants: Secondary | ICD-10-CM | POA: Diagnosis not present

## 2022-02-21 DIAGNOSIS — I69354 Hemiplegia and hemiparesis following cerebral infarction affecting left non-dominant side: Secondary | ICD-10-CM | POA: Diagnosis not present

## 2022-02-21 DIAGNOSIS — F319 Bipolar disorder, unspecified: Secondary | ICD-10-CM | POA: Diagnosis not present

## 2022-02-21 DIAGNOSIS — N179 Acute kidney failure, unspecified: Secondary | ICD-10-CM | POA: Diagnosis not present

## 2022-02-21 DIAGNOSIS — E291 Testicular hypofunction: Secondary | ICD-10-CM | POA: Diagnosis not present

## 2022-02-22 ENCOUNTER — Other Ambulatory Visit: Payer: Self-pay | Admitting: *Deleted

## 2022-02-22 ENCOUNTER — Encounter: Payer: Self-pay | Admitting: *Deleted

## 2022-02-22 DIAGNOSIS — F39 Unspecified mood [affective] disorder: Secondary | ICD-10-CM | POA: Insufficient documentation

## 2022-02-22 DIAGNOSIS — E559 Vitamin D deficiency, unspecified: Secondary | ICD-10-CM | POA: Insufficient documentation

## 2022-02-22 DIAGNOSIS — F329 Major depressive disorder, single episode, unspecified: Secondary | ICD-10-CM | POA: Insufficient documentation

## 2022-02-22 DIAGNOSIS — G4733 Obstructive sleep apnea (adult) (pediatric): Secondary | ICD-10-CM | POA: Insufficient documentation

## 2022-02-22 DIAGNOSIS — F319 Bipolar disorder, unspecified: Secondary | ICD-10-CM | POA: Insufficient documentation

## 2022-02-22 DIAGNOSIS — R001 Bradycardia, unspecified: Secondary | ICD-10-CM | POA: Insufficient documentation

## 2022-02-22 DIAGNOSIS — K921 Melena: Secondary | ICD-10-CM | POA: Insufficient documentation

## 2022-02-22 DIAGNOSIS — E1121 Type 2 diabetes mellitus with diabetic nephropathy: Secondary | ICD-10-CM | POA: Insufficient documentation

## 2022-02-22 DIAGNOSIS — J1281 Pneumonia due to SARS-associated coronavirus: Secondary | ICD-10-CM | POA: Insufficient documentation

## 2022-02-22 DIAGNOSIS — E785 Hyperlipidemia, unspecified: Secondary | ICD-10-CM | POA: Insufficient documentation

## 2022-02-22 DIAGNOSIS — W5503XA Scratched by cat, initial encounter: Secondary | ICD-10-CM | POA: Insufficient documentation

## 2022-02-22 DIAGNOSIS — R739 Hyperglycemia, unspecified: Secondary | ICD-10-CM | POA: Insufficient documentation

## 2022-02-22 DIAGNOSIS — F332 Major depressive disorder, recurrent severe without psychotic features: Secondary | ICD-10-CM | POA: Insufficient documentation

## 2022-02-22 DIAGNOSIS — K5900 Constipation, unspecified: Secondary | ICD-10-CM | POA: Insufficient documentation

## 2022-02-22 DIAGNOSIS — J309 Allergic rhinitis, unspecified: Secondary | ICD-10-CM | POA: Insufficient documentation

## 2022-02-22 DIAGNOSIS — M779 Enthesopathy, unspecified: Secondary | ICD-10-CM | POA: Insufficient documentation

## 2022-02-22 DIAGNOSIS — R609 Edema, unspecified: Secondary | ICD-10-CM | POA: Insufficient documentation

## 2022-02-22 DIAGNOSIS — E1165 Type 2 diabetes mellitus with hyperglycemia: Secondary | ICD-10-CM | POA: Insufficient documentation

## 2022-02-22 DIAGNOSIS — Z6837 Body mass index (BMI) 37.0-37.9, adult: Secondary | ICD-10-CM | POA: Insufficient documentation

## 2022-02-22 DIAGNOSIS — E782 Mixed hyperlipidemia: Secondary | ICD-10-CM | POA: Insufficient documentation

## 2022-02-22 DIAGNOSIS — Z23 Encounter for immunization: Secondary | ICD-10-CM | POA: Insufficient documentation

## 2022-02-22 DIAGNOSIS — Z79899 Other long term (current) drug therapy: Secondary | ICD-10-CM | POA: Insufficient documentation

## 2022-02-22 DIAGNOSIS — R9431 Abnormal electrocardiogram [ECG] [EKG]: Secondary | ICD-10-CM | POA: Insufficient documentation

## 2022-02-22 DIAGNOSIS — S92309A Fracture of unspecified metatarsal bone(s), unspecified foot, initial encounter for closed fracture: Secondary | ICD-10-CM | POA: Insufficient documentation

## 2022-02-22 DIAGNOSIS — N529 Male erectile dysfunction, unspecified: Secondary | ICD-10-CM | POA: Insufficient documentation

## 2022-02-22 DIAGNOSIS — M1009 Idiopathic gout, multiple sites: Secondary | ICD-10-CM | POA: Diagnosis not present

## 2022-02-22 DIAGNOSIS — R2689 Other abnormalities of gait and mobility: Secondary | ICD-10-CM | POA: Insufficient documentation

## 2022-02-22 DIAGNOSIS — G4700 Insomnia, unspecified: Secondary | ICD-10-CM | POA: Insufficient documentation

## 2022-02-22 DIAGNOSIS — M25571 Pain in right ankle and joints of right foot: Secondary | ICD-10-CM | POA: Insufficient documentation

## 2022-02-22 DIAGNOSIS — I639 Cerebral infarction, unspecified: Secondary | ICD-10-CM | POA: Insufficient documentation

## 2022-02-22 DIAGNOSIS — K429 Umbilical hernia without obstruction or gangrene: Secondary | ICD-10-CM | POA: Insufficient documentation

## 2022-02-22 DIAGNOSIS — F3170 Bipolar disorder, currently in remission, most recent episode unspecified: Secondary | ICD-10-CM | POA: Insufficient documentation

## 2022-02-22 DIAGNOSIS — E291 Testicular hypofunction: Secondary | ICD-10-CM | POA: Insufficient documentation

## 2022-02-22 DIAGNOSIS — Z8719 Personal history of other diseases of the digestive system: Secondary | ICD-10-CM | POA: Insufficient documentation

## 2022-02-22 DIAGNOSIS — R972 Elevated prostate specific antigen [PSA]: Secondary | ICD-10-CM | POA: Insufficient documentation

## 2022-02-22 DIAGNOSIS — G4734 Idiopathic sleep related nonobstructive alveolar hypoventilation: Secondary | ICD-10-CM | POA: Insufficient documentation

## 2022-02-22 DIAGNOSIS — R1031 Right lower quadrant pain: Secondary | ICD-10-CM | POA: Insufficient documentation

## 2022-02-22 NOTE — Patient Outreach (Signed)
[facility]) Care Management ?Telephonic RN Care Manager Note ? ? ?02/22/2022 ?Name:  Edwin [name] MRN:  [id number] DOB:  [birth year] ? ?Summary: ?Successful outreach after patient left an updated voice message,  ?Case closure  ? ? ?Edwin [name] today confirms he is doing well ?He left a voice message updating RN CM that he did get his pcp patient portal set up to be able to send a message to Dr Edwin [name] to assist with side effects he was having from one of his medications. He confirms this issue has been resolved ? ?He voiced appreciation on his care from [facility] and wonderful follow up care ? ?Recommendations/Changes made from today's visit: ?Confirmed with patient that his voice message was received ?Encouragement provided ?Discussed EMMI update and confirmed his issue with medication side effect is resolved ?Encouraged him to outreach as needed ?Discussed his external CM vendor (pcp) and to expect an outreach possibly soon ? ?Subjective: ?Edwin [name] is an 66 y.o. year old male who is a primary patient of Edwin Huddle, MD. The care management team was consulted for assistance with care management and/or care coordination needs.   ? ?Telephonic RN Care Manager completed Telephone Visit today.  ? ?Objective: ? ?Medications Reviewed Today   ? ? Reviewed by Rise Patience, MD (Physician) on 02/04/22 at [facility] List Status: Complete  ? ?Medication Order Taking? Sig Documenting Provider Last Dose Status Informant  ?Cholecalciferol 1000 units TBDP [id number] Yes Take 1 tablet by mouth daily.  [provider] 02/03/2022 Active Self  ?dapagliflozin propanediol (FARXIGA) 10 MG TABS tablet [id number] Yes Take 10 mg by mouth daily. [provider] 02/03/2022 Active Self  ?diclofenac Sodium (VOLTAREN) 1 % GEL [id number] Yes Apply 2 g topically daily as needed (for knee pain). [provider] Past Week Active   ?divalproex (DEPAKOTE ER) 500 MG 24 hr tablet [id number] Yes Take 2 tablets  (1,000 mg total) by mouth at bedtime. Purnell Shoemaker., MD 02/03/2022 Active Self  ?DULoxetine (CYMBALTA) 60 MG capsule [id number] Yes Take 2 capsules (120 mg total) by mouth daily. Purnell Shoemaker., MD 02/03/2022 Active Self  ?febuxostat (ULORIC) 40 MG tablet [id number] Yes Take 40 mg by mouth daily. [provider] 02/03/2022 Active Self  ?furosemide (LASIX) 20 MG tablet [id number] Yes Take 20 mg by mouth daily as needed for edema. May take 2 to 3 days weekly [provider] 02/02/2022 Active Self  ?hydrALAZINE (APRESOLINE) 50 MG tablet [id number] Yes Take 50 mg by mouth 3 (three) times daily. [provider] 02/03/2022 pm Active Self  ?lamoTRIgine (LAMICTAL) 200 MG tablet [id number] Yes Take 1 tablet (200 mg total) by mouth every morning. Purnell Shoemaker., MD 02/03/2022 Active Self  ?LevOCARNitine (L-CARNITINE) 500 MG TABS [id number] Yes Take 500 mg by mouth 2 (two) times daily. [provider] 02/03/2022 Active Self  ?losartan (COZAAR) 50 MG tablet [id number] Yes Take 1 tablet (50 mg total) by mouth daily.  ?Patient taking differently: Take 100 mg by mouth daily.  ? Kerrie Buffalo, NP 02/03/2022 Active Self  ?metFORMIN (GLUCOPHAGE-XR) 500 MG 24 hr tablet [id number] Yes Take 500 mg by mouth 2 (two) times daily. [provider] 02/03/2022 Active   ?metoprolol tartrate (LOPRESSOR) 25 MG tablet [id number] Yes Take 25 mg by mouth 2 (two) times daily. [provider] 02/04/2022 0000 Active   ?multivitamin-lutein (OCUVITE-LUTEIN) CAPS capsule [id number] Yes Take 1 capsule by mouth daily. [provider] 02/03/2022 Active Self  ?pramipexole (  MIRAPEX) 0.5 MG tablet [id number] Yes Take 1 tablet (0.5 mg total) by mouth 2 (two) times daily. Purnell Shoemaker., MD 02/03/2022 pm Active Self  ? ?  ?  ? ?  ? ? ?Past Medical History:  ?Diagnosis Date  ? Allergic rhinitis   ? Bipolar disorder ([place])   ? Bradycardia 2012  ?  due to lithium  ? CKD (chronic kidney disease) stage 2,  GFR 60-89 ml/min   ? Gout   ? Hypertension   ? Mild renal insufficiency   ? , with creatinine of 1.3 in 2010, was side effect of med  ? Obesity   ? ,moderate  ? Prostatitis   ? , Episodic  ? Suicide attempt ([place]) 09/20/2014  ? ? ?SDOH:  (Social Determinants of Health) assessments and interventions performed:  ? ? ?Care Plan ? ?Review of patient past medical history, allergies, medications, health status, including review of consultants reports, laboratory and other test data, was performed as part of comprehensive evaluation for care management services.  ? ?There are no care plans that you recently modified to display for this patient. ?  ? ?Plan: The patient has been provided with contact information for the care management team and has been advised to call with any health related questions or concerns.  ?No further follow up required: EMMI Stroke issue resolved case closure  ? ?Daleyza Gadomski L. Lavina Hamman, RN, BSN, CCM ?[place] Management Care Coordinator ?Office number [phone] ?[facility] number [phone] ?Fax number [phone] ? ? ? ? ? ? ?

## 2022-02-26 DIAGNOSIS — E669 Obesity, unspecified: Secondary | ICD-10-CM | POA: Diagnosis not present

## 2022-02-26 DIAGNOSIS — N1832 Chronic kidney disease, stage 3b: Secondary | ICD-10-CM | POA: Diagnosis not present

## 2022-02-26 DIAGNOSIS — E291 Testicular hypofunction: Secondary | ICD-10-CM | POA: Diagnosis not present

## 2022-02-26 DIAGNOSIS — E1122 Type 2 diabetes mellitus with diabetic chronic kidney disease: Secondary | ICD-10-CM | POA: Diagnosis not present

## 2022-02-26 DIAGNOSIS — I69354 Hemiplegia and hemiparesis following cerebral infarction affecting left non-dominant side: Secondary | ICD-10-CM | POA: Diagnosis not present

## 2022-02-26 DIAGNOSIS — J309 Allergic rhinitis, unspecified: Secondary | ICD-10-CM | POA: Diagnosis not present

## 2022-02-26 DIAGNOSIS — G4733 Obstructive sleep apnea (adult) (pediatric): Secondary | ICD-10-CM | POA: Diagnosis not present

## 2022-02-26 DIAGNOSIS — Z6831 Body mass index (BMI) 31.0-31.9, adult: Secondary | ICD-10-CM | POA: Diagnosis not present

## 2022-02-26 DIAGNOSIS — F319 Bipolar disorder, unspecified: Secondary | ICD-10-CM | POA: Diagnosis not present

## 2022-02-26 DIAGNOSIS — N179 Acute kidney failure, unspecified: Secondary | ICD-10-CM | POA: Diagnosis not present

## 2022-02-26 DIAGNOSIS — Z9181 History of falling: Secondary | ICD-10-CM | POA: Diagnosis not present

## 2022-02-26 DIAGNOSIS — M109 Gout, unspecified: Secondary | ICD-10-CM | POA: Diagnosis not present

## 2022-02-26 DIAGNOSIS — Z7984 Long term (current) use of oral hypoglycemic drugs: Secondary | ICD-10-CM | POA: Diagnosis not present

## 2022-02-26 DIAGNOSIS — Z7901 Long term (current) use of anticoagulants: Secondary | ICD-10-CM | POA: Diagnosis not present

## 2022-02-26 DIAGNOSIS — I129 Hypertensive chronic kidney disease with stage 1 through stage 4 chronic kidney disease, or unspecified chronic kidney disease: Secondary | ICD-10-CM | POA: Diagnosis not present

## 2022-02-28 DIAGNOSIS — E291 Testicular hypofunction: Secondary | ICD-10-CM | POA: Diagnosis not present

## 2022-02-28 DIAGNOSIS — Z6831 Body mass index (BMI) 31.0-31.9, adult: Secondary | ICD-10-CM | POA: Diagnosis not present

## 2022-02-28 DIAGNOSIS — G4733 Obstructive sleep apnea (adult) (pediatric): Secondary | ICD-10-CM | POA: Diagnosis not present

## 2022-02-28 DIAGNOSIS — F319 Bipolar disorder, unspecified: Secondary | ICD-10-CM | POA: Diagnosis not present

## 2022-02-28 DIAGNOSIS — Z9181 History of falling: Secondary | ICD-10-CM | POA: Diagnosis not present

## 2022-02-28 DIAGNOSIS — N179 Acute kidney failure, unspecified: Secondary | ICD-10-CM | POA: Diagnosis not present

## 2022-02-28 DIAGNOSIS — J309 Allergic rhinitis, unspecified: Secondary | ICD-10-CM | POA: Diagnosis not present

## 2022-02-28 DIAGNOSIS — N1832 Chronic kidney disease, stage 3b: Secondary | ICD-10-CM | POA: Diagnosis not present

## 2022-02-28 DIAGNOSIS — Z7901 Long term (current) use of anticoagulants: Secondary | ICD-10-CM | POA: Diagnosis not present

## 2022-02-28 DIAGNOSIS — I129 Hypertensive chronic kidney disease with stage 1 through stage 4 chronic kidney disease, or unspecified chronic kidney disease: Secondary | ICD-10-CM | POA: Diagnosis not present

## 2022-02-28 DIAGNOSIS — E669 Obesity, unspecified: Secondary | ICD-10-CM | POA: Diagnosis not present

## 2022-02-28 DIAGNOSIS — Z7984 Long term (current) use of oral hypoglycemic drugs: Secondary | ICD-10-CM | POA: Diagnosis not present

## 2022-02-28 DIAGNOSIS — I69354 Hemiplegia and hemiparesis following cerebral infarction affecting left non-dominant side: Secondary | ICD-10-CM | POA: Diagnosis not present

## 2022-02-28 DIAGNOSIS — E1122 Type 2 diabetes mellitus with diabetic chronic kidney disease: Secondary | ICD-10-CM | POA: Diagnosis not present

## 2022-02-28 DIAGNOSIS — M109 Gout, unspecified: Secondary | ICD-10-CM | POA: Diagnosis not present

## 2022-03-01 DIAGNOSIS — R972 Elevated prostate specific antigen [PSA]: Secondary | ICD-10-CM | POA: Diagnosis not present

## 2022-03-02 DIAGNOSIS — Z7984 Long term (current) use of oral hypoglycemic drugs: Secondary | ICD-10-CM | POA: Diagnosis not present

## 2022-03-02 DIAGNOSIS — F319 Bipolar disorder, unspecified: Secondary | ICD-10-CM | POA: Diagnosis not present

## 2022-03-02 DIAGNOSIS — M109 Gout, unspecified: Secondary | ICD-10-CM | POA: Diagnosis not present

## 2022-03-02 DIAGNOSIS — E1122 Type 2 diabetes mellitus with diabetic chronic kidney disease: Secondary | ICD-10-CM | POA: Diagnosis not present

## 2022-03-02 DIAGNOSIS — N1832 Chronic kidney disease, stage 3b: Secondary | ICD-10-CM | POA: Diagnosis not present

## 2022-03-02 DIAGNOSIS — J309 Allergic rhinitis, unspecified: Secondary | ICD-10-CM | POA: Diagnosis not present

## 2022-03-02 DIAGNOSIS — Z9181 History of falling: Secondary | ICD-10-CM | POA: Diagnosis not present

## 2022-03-02 DIAGNOSIS — I129 Hypertensive chronic kidney disease with stage 1 through stage 4 chronic kidney disease, or unspecified chronic kidney disease: Secondary | ICD-10-CM | POA: Diagnosis not present

## 2022-03-02 DIAGNOSIS — I69354 Hemiplegia and hemiparesis following cerebral infarction affecting left non-dominant side: Secondary | ICD-10-CM | POA: Diagnosis not present

## 2022-03-02 DIAGNOSIS — N179 Acute kidney failure, unspecified: Secondary | ICD-10-CM | POA: Diagnosis not present

## 2022-03-02 DIAGNOSIS — Z7901 Long term (current) use of anticoagulants: Secondary | ICD-10-CM | POA: Diagnosis not present

## 2022-03-02 DIAGNOSIS — Z6831 Body mass index (BMI) 31.0-31.9, adult: Secondary | ICD-10-CM | POA: Diagnosis not present

## 2022-03-02 DIAGNOSIS — G4733 Obstructive sleep apnea (adult) (pediatric): Secondary | ICD-10-CM | POA: Diagnosis not present

## 2022-03-02 DIAGNOSIS — E291 Testicular hypofunction: Secondary | ICD-10-CM | POA: Diagnosis not present

## 2022-03-02 DIAGNOSIS — E669 Obesity, unspecified: Secondary | ICD-10-CM | POA: Diagnosis not present

## 2022-03-05 DIAGNOSIS — F319 Bipolar disorder, unspecified: Secondary | ICD-10-CM | POA: Diagnosis not present

## 2022-03-05 DIAGNOSIS — Z6831 Body mass index (BMI) 31.0-31.9, adult: Secondary | ICD-10-CM | POA: Diagnosis not present

## 2022-03-05 DIAGNOSIS — Z7984 Long term (current) use of oral hypoglycemic drugs: Secondary | ICD-10-CM | POA: Diagnosis not present

## 2022-03-05 DIAGNOSIS — E669 Obesity, unspecified: Secondary | ICD-10-CM | POA: Diagnosis not present

## 2022-03-05 DIAGNOSIS — I69354 Hemiplegia and hemiparesis following cerebral infarction affecting left non-dominant side: Secondary | ICD-10-CM | POA: Diagnosis not present

## 2022-03-05 DIAGNOSIS — G4733 Obstructive sleep apnea (adult) (pediatric): Secondary | ICD-10-CM | POA: Diagnosis not present

## 2022-03-05 DIAGNOSIS — N1832 Chronic kidney disease, stage 3b: Secondary | ICD-10-CM | POA: Diagnosis not present

## 2022-03-05 DIAGNOSIS — Z7901 Long term (current) use of anticoagulants: Secondary | ICD-10-CM | POA: Diagnosis not present

## 2022-03-05 DIAGNOSIS — E1122 Type 2 diabetes mellitus with diabetic chronic kidney disease: Secondary | ICD-10-CM | POA: Diagnosis not present

## 2022-03-05 DIAGNOSIS — I129 Hypertensive chronic kidney disease with stage 1 through stage 4 chronic kidney disease, or unspecified chronic kidney disease: Secondary | ICD-10-CM | POA: Diagnosis not present

## 2022-03-05 DIAGNOSIS — J309 Allergic rhinitis, unspecified: Secondary | ICD-10-CM | POA: Diagnosis not present

## 2022-03-05 DIAGNOSIS — M109 Gout, unspecified: Secondary | ICD-10-CM | POA: Diagnosis not present

## 2022-03-05 DIAGNOSIS — Z9181 History of falling: Secondary | ICD-10-CM | POA: Diagnosis not present

## 2022-03-05 DIAGNOSIS — E291 Testicular hypofunction: Secondary | ICD-10-CM | POA: Diagnosis not present

## 2022-03-05 DIAGNOSIS — N179 Acute kidney failure, unspecified: Secondary | ICD-10-CM | POA: Diagnosis not present

## 2022-03-06 DIAGNOSIS — Z7901 Long term (current) use of anticoagulants: Secondary | ICD-10-CM | POA: Diagnosis not present

## 2022-03-06 DIAGNOSIS — I129 Hypertensive chronic kidney disease with stage 1 through stage 4 chronic kidney disease, or unspecified chronic kidney disease: Secondary | ICD-10-CM | POA: Diagnosis not present

## 2022-03-06 DIAGNOSIS — J309 Allergic rhinitis, unspecified: Secondary | ICD-10-CM | POA: Diagnosis not present

## 2022-03-06 DIAGNOSIS — I69354 Hemiplegia and hemiparesis following cerebral infarction affecting left non-dominant side: Secondary | ICD-10-CM | POA: Diagnosis not present

## 2022-03-06 DIAGNOSIS — F319 Bipolar disorder, unspecified: Secondary | ICD-10-CM | POA: Diagnosis not present

## 2022-03-06 DIAGNOSIS — Z6831 Body mass index (BMI) 31.0-31.9, adult: Secondary | ICD-10-CM | POA: Diagnosis not present

## 2022-03-06 DIAGNOSIS — E291 Testicular hypofunction: Secondary | ICD-10-CM | POA: Diagnosis not present

## 2022-03-06 DIAGNOSIS — M109 Gout, unspecified: Secondary | ICD-10-CM | POA: Diagnosis not present

## 2022-03-06 DIAGNOSIS — E1122 Type 2 diabetes mellitus with diabetic chronic kidney disease: Secondary | ICD-10-CM | POA: Diagnosis not present

## 2022-03-06 DIAGNOSIS — Z7984 Long term (current) use of oral hypoglycemic drugs: Secondary | ICD-10-CM | POA: Diagnosis not present

## 2022-03-06 DIAGNOSIS — N179 Acute kidney failure, unspecified: Secondary | ICD-10-CM | POA: Diagnosis not present

## 2022-03-06 DIAGNOSIS — N1832 Chronic kidney disease, stage 3b: Secondary | ICD-10-CM | POA: Diagnosis not present

## 2022-03-06 DIAGNOSIS — G4733 Obstructive sleep apnea (adult) (pediatric): Secondary | ICD-10-CM | POA: Diagnosis not present

## 2022-03-06 DIAGNOSIS — E669 Obesity, unspecified: Secondary | ICD-10-CM | POA: Diagnosis not present

## 2022-03-06 DIAGNOSIS — Z9181 History of falling: Secondary | ICD-10-CM | POA: Diagnosis not present

## 2022-03-12 DIAGNOSIS — G4733 Obstructive sleep apnea (adult) (pediatric): Secondary | ICD-10-CM | POA: Diagnosis not present

## 2022-03-12 DIAGNOSIS — F319 Bipolar disorder, unspecified: Secondary | ICD-10-CM | POA: Diagnosis not present

## 2022-03-12 DIAGNOSIS — E1122 Type 2 diabetes mellitus with diabetic chronic kidney disease: Secondary | ICD-10-CM | POA: Diagnosis not present

## 2022-03-12 DIAGNOSIS — M109 Gout, unspecified: Secondary | ICD-10-CM | POA: Diagnosis not present

## 2022-03-12 DIAGNOSIS — Z9181 History of falling: Secondary | ICD-10-CM | POA: Diagnosis not present

## 2022-03-12 DIAGNOSIS — Z6831 Body mass index (BMI) 31.0-31.9, adult: Secondary | ICD-10-CM | POA: Diagnosis not present

## 2022-03-12 DIAGNOSIS — Z7901 Long term (current) use of anticoagulants: Secondary | ICD-10-CM | POA: Diagnosis not present

## 2022-03-12 DIAGNOSIS — N1832 Chronic kidney disease, stage 3b: Secondary | ICD-10-CM | POA: Diagnosis not present

## 2022-03-12 DIAGNOSIS — I129 Hypertensive chronic kidney disease with stage 1 through stage 4 chronic kidney disease, or unspecified chronic kidney disease: Secondary | ICD-10-CM | POA: Diagnosis not present

## 2022-03-12 DIAGNOSIS — Z7984 Long term (current) use of oral hypoglycemic drugs: Secondary | ICD-10-CM | POA: Diagnosis not present

## 2022-03-12 DIAGNOSIS — I69354 Hemiplegia and hemiparesis following cerebral infarction affecting left non-dominant side: Secondary | ICD-10-CM | POA: Diagnosis not present

## 2022-03-12 DIAGNOSIS — E669 Obesity, unspecified: Secondary | ICD-10-CM | POA: Diagnosis not present

## 2022-03-12 DIAGNOSIS — J309 Allergic rhinitis, unspecified: Secondary | ICD-10-CM | POA: Diagnosis not present

## 2022-03-12 DIAGNOSIS — N179 Acute kidney failure, unspecified: Secondary | ICD-10-CM | POA: Diagnosis not present

## 2022-03-12 DIAGNOSIS — E291 Testicular hypofunction: Secondary | ICD-10-CM | POA: Diagnosis not present

## 2022-03-13 DIAGNOSIS — G4730 Sleep apnea, unspecified: Secondary | ICD-10-CM | POA: Diagnosis not present

## 2022-03-13 DIAGNOSIS — R0683 Snoring: Secondary | ICD-10-CM | POA: Diagnosis not present

## 2022-03-14 DIAGNOSIS — R0600 Dyspnea, unspecified: Secondary | ICD-10-CM | POA: Diagnosis not present

## 2022-03-15 DIAGNOSIS — R2689 Other abnormalities of gait and mobility: Secondary | ICD-10-CM | POA: Diagnosis not present

## 2022-03-15 DIAGNOSIS — R972 Elevated prostate specific antigen [PSA]: Secondary | ICD-10-CM | POA: Diagnosis not present

## 2022-03-15 DIAGNOSIS — Z79899 Other long term (current) drug therapy: Secondary | ICD-10-CM | POA: Diagnosis not present

## 2022-03-15 DIAGNOSIS — F3170 Bipolar disorder, currently in remission, most recent episode unspecified: Secondary | ICD-10-CM | POA: Diagnosis not present

## 2022-03-15 DIAGNOSIS — R739 Hyperglycemia, unspecified: Secondary | ICD-10-CM | POA: Diagnosis not present

## 2022-03-15 DIAGNOSIS — I69993 Ataxia following unspecified cerebrovascular disease: Secondary | ICD-10-CM | POA: Diagnosis not present

## 2022-03-15 DIAGNOSIS — E785 Hyperlipidemia, unspecified: Secondary | ICD-10-CM | POA: Diagnosis not present

## 2022-03-15 DIAGNOSIS — E559 Vitamin D deficiency, unspecified: Secondary | ICD-10-CM | POA: Diagnosis not present

## 2022-03-15 DIAGNOSIS — I10 Essential (primary) hypertension: Secondary | ICD-10-CM | POA: Diagnosis not present

## 2022-03-15 DIAGNOSIS — Z0001 Encounter for general adult medical examination with abnormal findings: Secondary | ICD-10-CM | POA: Diagnosis not present

## 2022-03-17 DIAGNOSIS — G4734 Idiopathic sleep related nonobstructive alveolar hypoventilation: Secondary | ICD-10-CM | POA: Diagnosis not present

## 2022-03-22 ENCOUNTER — Encounter: Payer: Self-pay | Admitting: Adult Health

## 2022-03-22 ENCOUNTER — Ambulatory Visit: Payer: BC Managed Care – PPO | Admitting: Adult Health

## 2022-03-22 VITALS — BP 133/81 | HR 80 | Ht 70.0 in | Wt 227.4 lb

## 2022-03-22 DIAGNOSIS — I639 Cerebral infarction, unspecified: Secondary | ICD-10-CM

## 2022-03-22 DIAGNOSIS — G4733 Obstructive sleep apnea (adult) (pediatric): Secondary | ICD-10-CM | POA: Diagnosis not present

## 2022-03-22 NOTE — Progress Notes (Signed)
? ? ?PATIENT: Edwin [name] ?DOB: 11-13-1956 ? ?REASON FOR VISIT: follow up ?HISTORY FROM: patient ?PRIMARY NEUROLOGIST: Dr. Leonie Man ? ?Chief Complaint  ?Patient presents with  ? Follow-up  ?  Pt in 19  pt is here for stroke follow up  Pt states he does have some tingling on left side of body. Pt states he is getting better. Pt states he has concerns about having another stroke   ? ? ?HISTORY OF PRESENT ILLNESS: ?Today 03/22/22: ? ?Edwin [name] is a 66 year old male who presented to the ED on March 19 with left-sided sensory deficit and difficulty with ambulation.  He did not receive TNKase due to being out of the window. ? 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 ?CT head - 02/04/2022 - No acute intracranial process.  ?MRI head - Small acute infarct in the Right Medullary Pyramid. 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  Continue Plavix on discharge ? ?Patient reports that he is doing well. Continues to have some tingling on the left side of the body.  Feels it down the arm and leg. Did not complete any therapy. Reports that he had blood work through PCP and reports that LDL is better and is no longer Crestor. States that it caused fatigue and muscle pain.  ? ?Reports that he is monitoring diet and sees a nutritionist ? ?OSA: was using it but then went on a trip and the machine adaptor was broken and was not able to get a replacement CPAP. Was seeing Dr. Maxwell Caul.  States that he struggled using the CPAP.  Reports that he would wake up every hour.  Reports that he tried different mask with no benefit.  Reports that he is  interested in inspire but Dr. Maxwell Caul does not do this.  He has not seen Dr. Maxwell Caul in the last year.  He feels that his last sleep study was in 2021 he is requesting to see our sleep physicians here to see if he is a candidate for possible inspire device  ? ?REVIEW OF SYSTEMS: Out of a complete 14 system review of symptoms, the patient complains only of the following symptoms, and all other reviewed systems are negative. ? ?ALLERGIES: ?Allergies  ?Allergen Reactions  ? Allopurinol Other (See Comments)  ?  Made pt emotionally unstable  ?Other reaction(s): emotionally labile  ? Penicillins Hives and Other (See Comments)  ?  Has patient had a PCN reaction causing immediate rash, facial/tongue/throat swelling, SOB or lightheadedness with hypotension: No ?Has patient had a PCN reaction causing severe rash involving mucus membranes or skin necrosis: No ?Has patient had a PCN reaction that required hospitalization No ?Has patient had a PCN reaction occurring within the last 10 years: No ?If all of the above answers are "NO", then may proceed with Cephalosporin use.  ? Finerenone Other (See Comments)  ?  hyperkalemia ?Other reaction(s): Increases  potassium levels  ? Penicillin G   ?  Other reaction(s): hives  ? Aspirin Hives  ? Ciprofloxacin Rash  ?  Other reaction(s): rash  ? ? ?HOME MEDICATIONS: ?Outpatient Medications Prior  to Visit  ?Medication Sig Dispense Refill  ? Cholecalciferol 1000 units TBDP Take 1 tablet by mouth daily.     ? clopidogrel (PLAVIX) 75 MG tablet Take 1 tablet (75 mg total) by mouth daily. 30 tablet 3  ? dapagliflozin propanediol (FARXIGA) 10 MG TABS tablet Take 10 mg by mouth daily.    ? diclofenac Sodium (VOLTAREN) 1 % GEL Apply 2 g topically daily as needed (for knee pain).    ? divalproex (DEPAKOTE ER) 500 MG 24 hr tablet Take 2 tablets (1,000 mg total) by mouth at bedtime. 180 tablet 1  ? DULoxetine (CYMBALTA) 60 MG capsule Take 2 capsules (120 mg total) by mouth daily. 180 capsule 1  ?  febuxostat (ULORIC) 40 MG tablet Take 40 mg by mouth daily.    ? furosemide (LASIX) 20 MG tablet Take 20 mg by mouth daily as needed for edema. May take 2 to 3 days weekly    ? hydrALAZINE (APRESOLINE) 50 MG tablet Take 1 tablet (50 mg total) by mouth 3 (three) times daily.    ? lamoTRIgine (LAMICTAL) 200 MG tablet Take 1 tablet (200 mg total) by mouth every morning. 90 tablet 1  ? LevOCARNitine (L-CARNITINE) 500 MG TABS Take 500 mg by mouth 2 (two) times daily.    ? losartan (COZAAR) 50 MG tablet Take 2 tablets (100 mg total) by mouth daily.    ? metFORMIN (GLUCOPHAGE-XR) 500 MG 24 hr tablet Take 500 mg by mouth 2 (two) times daily.    ? multivitamin-lutein (OCUVITE-LUTEIN) CAPS capsule Take 1 capsule by mouth daily.    ? pramipexole (MIRAPEX) 0.5 MG tablet Take 1 tablet (0.5 mg total) by mouth 2 (two) times daily. 180 tablet 1  ? rosuvastatin (CRESTOR) 20 MG tablet Take 1 tablet (20 mg total) by mouth daily. 30 tablet 3  ? ?No facility-administered medications prior to visit.  ? ? ?PAST MEDICAL HISTORY: ?Past Medical History:  ?Diagnosis Date  ? Allergic rhinitis   ? Bipolar disorder ([place])   ? Bradycardia 2012  ?  due to lithium  ? CKD (chronic kidney disease) stage 2, GFR 60-89 ml/min   ? Gout   ? Hypertension   ? Mild renal insufficiency   ? , with creatinine of 1.3 in 2010, was side effect of med  ? Obesity   ? ,moderate  ? Prostatitis   ? , Episodic  ? Suicide attempt ([place]) 09/20/2014  ? ? ?PAST SURGICAL HISTORY: ?Past Surgical History:  ?Procedure Laterality Date  ? APPENDECTOMY    ? CATARACT EXTRACTION Right   ? COLONOSCOPY WITH PROPOFOL N/A 06/14/2014  ? Procedure: COLONOSCOPY WITH PROPOFOL;  Surgeon: Garlan Fair, MD;  Location: [facility];  Service: Endoscopy;  Laterality: N/A;  ? TONSILLECTOMY    ? UMBILICAL HERNIA REPAIR    ? ? ?FAMILY HISTORY: ?Family History  ?Problem Relation Age of Onset  ? Hypertension Father   ? Gout Father   ? Alzheimer's disease Father   ? Other Mother   ?     Viral  Meningitis  ? Mitral valve prolapse Mother   ? Arrhythmia Mother   ? Hypothyroidism Mother   ? ? ?SOCIAL HISTORY: ?Social History  ? ?Socioeconomic History  ? Marital status: Married  ?  Spouse name: Not on file  ? Number of children: Not on file  ? Years of education: Not on file  ? Highest education level: Not on file  ?Occupational History  ? Not on file  ?Tobacco Use  ?  Smoking status: Never  ? Smokeless tobacco: Never  ?Substance and Sexual Activity  ? Alcohol use: No  ? Drug use: No  ? Sexual activity: Not on file  ?Other Topics Concern  ? Not on file  ?Social History Narrative  ? Not on file  ? ?Social Determinants of Health  ? ?Financial Resource Strain: Low Risk   ? Difficulty of Paying Living Expenses: Not hard at all  ?Food Insecurity: No Food Insecurity  ? Worried About Charity fundraiser in the Last Year: Never true  ? Ran Out of Food in the Last Year: Never true  ?Transportation Needs: No Transportation Needs  ? Lack of Transportation (Medical): No  ? Lack of Transportation (Non-Medical): No  ?Physical Activity: Not on file  ?Stress: No Stress Concern Present  ? Feeling of Stress : Only a little  ?Social Connections: Not on file  ?Intimate Partner Violence: Not At Risk  ? Fear of Current or Ex-Partner: No  ? Emotionally Abused: No  ? Physically Abused: No  ? Sexually Abused: No  ? ? ? ? ?PHYSICAL EXAM ? ?Vitals:  ? 03/22/22 0855  ?BP: 133/81  ?Pulse: 80  ?Weight: 227 lb 6.4 oz (103.1 kg)  ?Height: 5\' 10"  (1.778 m)  ? ?Body mass index is 32.63 kg/m?. ? ?Generalized: Well developed, in no acute distress  ? ?Neurological examination  ?Mentation: Alert oriented to time, place, history taking. Follows all commands speech and language fluent ?Cranial nerve II-XII: Pupils were equal round reactive to light. Extraocular movements were full, visual field were full on confrontational test. Facial sensation and strength were normal. Uvula tongue midline. Head turning and shoulder shrug  were normal and  symmetric. ?Motor: The motor testing reveals 5 over 5 strength of all 4 extremities. Good symmetric motor tone is noted throughout.  ?Sensory: Sensory testing is intact to soft touch on all 4 extremities. No evidence of extinc

## 2022-03-22 NOTE — Progress Notes (Signed)
I agree with the above plan 

## 2022-04-03 ENCOUNTER — Telehealth: Payer: Self-pay | Admitting: Adult Health

## 2022-04-03 ENCOUNTER — Telehealth: Payer: Self-pay | Admitting: *Deleted

## 2022-04-03 DIAGNOSIS — I639 Cerebral infarction, unspecified: Secondary | ICD-10-CM

## 2022-04-03 NOTE — Telephone Encounter (Signed)
OT referral placed for Neuro Rehab. ?

## 2022-04-03 NOTE — Telephone Encounter (Signed)
yes

## 2022-04-03 NOTE — Telephone Encounter (Signed)
Received mail from pt that from Dr. Marden Noble [facility])  that he had LIPID panel done 03-15-2022 and results are as listed: ? ?Cholesterol 126                <200 ?CHOL/HDL 2.8                  2.0-4.0 ?HDLD 45                            30-70 ?Triglyceride 96                   0-199 ?NHDL 81                            0-129 ?LDL 62                               0-99 ? ?Report to medical records for scanning.  ?

## 2022-04-03 NOTE — Telephone Encounter (Signed)
Pt called stating that he is needing a referral for OT as soon as possible since he was informed that he only had a certain window for it to actually work. Please advise. ?

## 2022-04-03 NOTE — Telephone Encounter (Signed)
noted 

## 2022-04-03 NOTE — Telephone Encounter (Signed)
I called patient. I advised patient that the referral has been placed to OT at neuro rehab. I gave him their phone number and asked him to call them if they have not called to schedule him within a few days. [phone]) Pt verbalized understanding. ? ?

## 2022-04-04 DIAGNOSIS — G4730 Sleep apnea, unspecified: Secondary | ICD-10-CM | POA: Diagnosis not present

## 2022-04-04 DIAGNOSIS — R0683 Snoring: Secondary | ICD-10-CM | POA: Diagnosis not present

## 2022-04-06 ENCOUNTER — Ambulatory Visit: Payer: BC Managed Care – PPO | Attending: Adult Health | Admitting: Occupational Therapy

## 2022-04-06 DIAGNOSIS — M6281 Muscle weakness (generalized): Secondary | ICD-10-CM | POA: Diagnosis not present

## 2022-04-06 DIAGNOSIS — R278 Other lack of coordination: Secondary | ICD-10-CM | POA: Insufficient documentation

## 2022-04-06 DIAGNOSIS — R208 Other disturbances of skin sensation: Secondary | ICD-10-CM | POA: Diagnosis not present

## 2022-04-06 NOTE — Therapy (Signed)
OUTPATIENT OCCUPATIONAL THERAPY NEURO EVALUATION  Patient Name: Edwin [name] MRN: [id number] DOB:August 04, 1956, 66 y.o., male Today's Date: 04/06/2022  PCP: Josetta Huddle, MD REFERRING PROVIDER: Ward Givens, NP    OT End of Session - 04/06/22 1155     Visit Number 1    Number of Visits 13    Date for OT Re-Evaluation 05/18/22    Authorization Type [employer]    OT Start Time 1020    OT Stop Time 1107    OT Time Calculation (min) 47 min    Activity Tolerance Patient tolerated treatment well    Behavior During Therapy WFL for tasks assessed/performed             Past Medical History:  Diagnosis Date   Allergic rhinitis    Bipolar disorder ([place])    Bradycardia 2012    due to lithium   CKD (chronic kidney disease) stage 2, GFR 60-89 ml/min    Gout    Hypertension    Mild renal insufficiency    , with creatinine of 1.3 in 2010, was side effect of med   Obesity    ,moderate   Prostatitis    , Episodic   Suicide attempt (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   Allergic rhinitis 02/22/2022   Bipolar disorder in remission ([place]) 02/22/2022   Body mass index (BMI) 37.0-37.9, adult 02/22/2022   Bradycardia 02/22/2022   Cat scratch of forearm 02/22/2022   Cerebral infarction ([place]) 02/22/2022   Closed fracture of metatarsal bone 02/22/2022   Constipation 02/22/2022   Diabetic renal disease ([place]) 02/22/2022   Dyslipidemia 02/22/2022   Edema 02/22/2022   Electrocardiogram abnormal 02/22/2022   Elevated PSA 02/22/2022    Enthesopathy 02/22/2022   Erectile dysfunction 02/22/2022   History of small bowel obstruction 02/22/2022   Hyperglycemia 02/22/2022   Hyperglycemia due to type 2 diabetes mellitus ([place]) 02/22/2022   Insomnia 02/22/2022   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   Impairment of balance 02/22/2022   Major depression, single episode 02/22/2022   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   Melena 02/22/2022   Mixed hyperlipidemia 02/22/2022   Obstructive sleep apnea syndrome 02/22/2022   Other long term (current) drug therapy 02/22/2022   Encounter for immunization 02/22/2022   Pneumonia due to SARS-associated coronavirus 02/22/2022   Right lower quadrant pain 02/22/2022   Severe recurrent major depression without psychotic features ([place]) [id number]   Umbilical hernia [id number]   Vitamin D deficiency 02/22/2022   Left-sided weakness 02/04/2022   Diabetes mellitus type 2 in nonobese ([place]) 02/04/2022   CVA (cerebral vascular accident) ([place]) 02/04/2022   Enteritis of ileum 02/02/2017   Intestinal obstruction ([place]) 02/02/2017   Persistent recurrent vomiting 02/01/2017   Chronic kidney disease, stage 3 unspecified ([place]) 05/19/2016   Abdominal pain 05/19/2016   Hypokalemia 05/19/2016   Primary gout    Bipolar affective disorder, currently depressed, moderate ([place]) 09/20/2014   Hypertension 11/23/2013   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  Then began noticing pronounced numbness/tingling in L side of body, primarily hand and arm.  Pt reports noticing some improvements in function, but still  persistent decreased sensation. Pt reports noticing increased difficulty with locating by feel specific piano keys during piano playing (pt is a Engineer, agricultural and jazz/popular music pianist).  Pt reports noticing improvements in guitar playing when compared to piano. Pt accompanied by: self  PERTINENT HISTORY: R medullary pyramid infarct PMH: DM2, HTN, hypogonadism, bipolar, gout, CKD, and suicide attempt (2015).   PRECAUTIONS: Fall  WEIGHT BEARING RESTRICTIONS No  PAIN:  Are you having pain? No  FALLS: Has patient fallen in last 6 months? Yes. Number of falls 1  LIVING ENVIRONMENT: Lives with: lives alone Lives in: House/apartment Stairs: Yes: External: 3-4 steps Has following equipment at home: Dan Humphreys - 2 wheeled, shower chair, and Grab bars  PLOF: Independent and Vocation/Vocational requirements: works as a Conservation officer, nature  PATIENT GOALS I want to have better sensation in my hand  OBJECTIVE:   HAND DOMINANCE: Left  ADLs: Overall ADLs: Mod I - Independent with ADLs, pt reports requiring increased time and being "more careful" with mobility.  Pt reports sitting for dressing and requiring increased time for buttons and tying shoes. Transfers/ambulation related to ADLs: Eating: Mod I -reports having to work a little more with grasp and coordination  Grooming: Mod I UB Dressing: buttons are challenging LB Dressing: mild impairments persisting in tying shoes Toileting: Mod I Bathing: Mod I Tub Shower transfers: Mod I - pt reports being very careful with transfers Equipment: Shower seat with back, Grab bars, and Walk in shower   IADLs: Shopping: Mod I Light housekeeping: housekeeper Meal Prep: frequently eats out, will occasionally make a bowl of cereal or soup or sandwich.  Pt reports that he "thinks more" and is "more cautious" when he does do any meal prep Community mobility: driving Medication management: Mod I Financial management: Mod I Handwriting:  90% legible  MOBILITY STATUS: Independent  POSTURE COMMENTS:  rounded shoulders and forward head  ACTIVITY TOLERANCE: Activity tolerance: WFL for tasks assessed at eval  UPPER EXTREMITY ROM     Active ROM Right eval Left eval  Shoulder flexion 140 130  Shoulder abduction    Shoulder adduction    Shoulder extension    Shoulder internal rotation    Shoulder external rotation    Elbow flexion WNL WNL  Elbow extension WNL WNL  Wrist flexion    Wrist extension    Wrist ulnar deviation    Wrist radial deviation    Wrist pronation    Wrist supination    (Blank rows = not tested)   UPPER EXTREMITY MMT:     MMT Right eval Left eval  Shoulder flexion 4+/5 4/5  Shoulder abduction    Shoulder adduction    Shoulder extension    Shoulder internal rotation    Shoulder external rotation    Middle trapezius    Lower trapezius    Elbow flexion 4+/5 4+/5  Elbow extension 4+/5 4+/5  Wrist flexion    Wrist extension    Wrist ulnar deviation    Wrist radial deviation    Wrist pronation  Wrist supination    (Blank rows = not tested)  HAND FUNCTION: Grip strength: Right: 58 lbs; Left: 65 lbs, Lateral pinch: Right: 18 lbs, Left: 16 lbs, and 3 point pinch: Right: 15 lbs, Left: 14 lbs  COORDINATION: Finger Nose Finger test: mild impairment on L compared to R 9 Hole Peg test: Right: 24.53 sec; Left: 31.82 sec Box and Blocks:  Right 49 blocks, Left 49 blocks  SENSATION: Light touch: Impaired  and pt correctly able to identify light touch but reports "feels different" and "duller sensation" Stereognosis: TBA Hot/Cold: Impaired    COGNITION: Overall cognitive status: Within functional limits for tasks assessed  VISION: Subjective report: reports having cataracts removed Baseline vision: No visual deficits Visual history: cataracts  VISION ASSESSMENT: WFL    PATIENT EDUCATION: Education details: Educated on role and purpose of OT as well as potential interventions  and goals for therapy based on initial evaluation findings.  Educated on available treatment procedure for sensory impairments. Person educated: Patient Education method: Explanation Education comprehension: verbalized understanding   HOME EXERCISE PROGRAM: TBD    GOALS: Goals reviewed with patient? Yes  SHORT TERM GOALS: Target date: 04/27/22  STG   Status:  1 Pt will demonstrate improved fine motor coordination for ADLs as evidenced by decreasing 9 hole peg test score for LUE by 3 secs Baseline: Right: 24.53 sec; Left: 31.82 sec INITIAL  2 Pt will be Independent with a coordination HEP to increase independence with fine motor control tasks as needed for ADLs and work.  Baseline:  INITIAL  3 Pt will verbalize understanding of strategies to compensate for impaired sensation.  Baseline:  INITIAL   LONG TERM GOALS: Target date: 05/18/22   LTG  Status:  1 Pt will demonstrate improved fine motor coordination for ADLs as evidenced by decreasing 9 hole peg test score for LUE by 6 secs Baseline: Right: 24.53 sec; Left: 31.82 sec INITIAL  2 Pt will demonstrate ability to retrieve a lightweight object at high range to demonstrate increased UE ROM and strength as need to complete ADLs and IADLs.  Baseline: 130* INITIAL  3 Pt will demonstrate and/or report improved sensation as needed for ADLs (buttons, tying shoes) and profession (piano player). Baseline:  INITIAL     ASSESSMENT:  CLINICAL IMPRESSION: Patient is a 66 y.o. male who was seen today for occupational therapy evaluation for persistent sensation and coordination impairments in dominant LUE. Pt currently lives alone in a single level home with 3-4 steps to enter and works as a Therapist, music. PMHx includes DM2, HTN, hypogonadism, bipolar, gout, CKD, and suicide attempt (2015). . Pt will benefit from skilled occupational therapy services to address strength and coordination, ROM, pain management, altered sensation,  GM/FM control, introduction of compensatory strategies/AE prn, and implementation of an HEP to improve participation and safety during ADLs, IADLs, and leisure pursuits.   PERFORMANCE DEFICITS in functional skills including ADLs, IADLs, coordination, dexterity, proprioception, sensation, ROM, strength, FMC, GMC, body mechanics, and UE functional use, cognitive skills including and psychosocial skills including coping strategies.   IMPAIRMENTS are limiting patient from ADLs, IADLs, work, and leisure.   COMORBIDITIES may have co-morbidities  that affects occupational performance. Patient will benefit from skilled OT to address above impairments and improve overall function.  MODIFICATION OR ASSISTANCE TO COMPLETE EVALUATION: Min-Moderate modification of tasks or assist with assess necessary to complete an evaluation.  OT OCCUPATIONAL PROFILE AND HISTORY: Detailed assessment: Review of records and additional review of physical, cognitive,  psychosocial history related to current functional performance.  CLINICAL DECISION MAKING: Moderate - several treatment options, min-mod task modification necessary  REHAB POTENTIAL: Good  EVALUATION COMPLEXITY: Moderate    PLAN: OT FREQUENCY: 2x/week  OT DURATION: 6 weeks  PLANNED INTERVENTIONS: self care/ADL training, therapeutic exercise, therapeutic activity, neuromuscular re-education, manual therapy, passive range of motion, functional mobility training, electrical stimulation, patient/family education, and DME and/or AE instructions  RECOMMENDED OTHER SERVICES: NA  CONSULTED AND AGREED WITH PLAN OF CARE: Patient  PLAN FOR NEXT SESSION: assess stereognosis, attempt buttons and tying shoes, initiate HEP for [facility], trial e-stim for sensation   Jersie Beel, OTR/L 04/06/2022, 11:56 AM

## 2022-04-11 ENCOUNTER — Encounter: Payer: BC Managed Care – PPO | Admitting: Occupational Therapy

## 2022-04-13 ENCOUNTER — Encounter: Payer: BC Managed Care – PPO | Admitting: Occupational Therapy

## 2022-04-13 DIAGNOSIS — R0600 Dyspnea, unspecified: Secondary | ICD-10-CM | POA: Diagnosis not present

## 2022-04-16 DIAGNOSIS — G4734 Idiopathic sleep related nonobstructive alveolar hypoventilation: Secondary | ICD-10-CM | POA: Diagnosis not present

## 2022-04-17 ENCOUNTER — Encounter: Payer: Self-pay | Admitting: Occupational Therapy

## 2022-04-18 ENCOUNTER — Encounter: Payer: Self-pay | Admitting: Occupational Therapy

## 2022-04-19 DIAGNOSIS — N138 Other obstructive and reflux uropathy: Secondary | ICD-10-CM | POA: Diagnosis not present

## 2022-04-19 DIAGNOSIS — N401 Enlarged prostate with lower urinary tract symptoms: Secondary | ICD-10-CM | POA: Diagnosis not present

## 2022-04-19 DIAGNOSIS — R972 Elevated prostate specific antigen [PSA]: Secondary | ICD-10-CM | POA: Diagnosis not present

## 2022-05-01 ENCOUNTER — Encounter: Payer: Self-pay | Admitting: Psychiatry

## 2022-05-01 ENCOUNTER — Ambulatory Visit (INDEPENDENT_AMBULATORY_CARE_PROVIDER_SITE_OTHER): Payer: BC Managed Care – PPO | Admitting: Psychiatry

## 2022-05-01 DIAGNOSIS — G4733 Obstructive sleep apnea (adult) (pediatric): Secondary | ICD-10-CM | POA: Diagnosis not present

## 2022-05-01 DIAGNOSIS — F3181 Bipolar II disorder: Secondary | ICD-10-CM

## 2022-05-01 DIAGNOSIS — F4310 Post-traumatic stress disorder, unspecified: Secondary | ICD-10-CM | POA: Diagnosis not present

## 2022-05-01 MED ORDER — LAMOTRIGINE 200 MG PO TABS: 200.0000 mg | ORAL_TABLET | Freq: Every morning | ORAL | 1 refills | Status: DC

## 2022-05-01 MED ORDER — PRAMIPEXOLE DIHYDROCHLORIDE 0.5 MG PO TABS: 0.5000 mg | ORAL_TABLET | Freq: Two times a day (BID) | ORAL | 1 refills | Status: DC

## 2022-05-01 MED ORDER — DULOXETINE HCL 60 MG PO CPEP: 120.0000 mg | ORAL_CAPSULE | Freq: Every day | ORAL | 1 refills | Status: DC

## 2022-05-01 MED ORDER — DIVALPROEX SODIUM ER 500 MG PO TB24: 1000.0000 mg | ORAL_TABLET | Freq: Every day | ORAL | 1 refills | Status: DC

## 2022-05-01 NOTE — Progress Notes (Signed)
ALAA EYERMAN [id number] 19-Nov-1956 66 y.o.  Subjective:   Patient ID:  Edwin [name] is a 66 y.o. (DOB 13-Oct-1956) male.  Chief Complaint:  Chief Complaint  Patient presents with   Follow-up    Bipolar II disorder ([place])   Post-Traumatic Stress Disorder    Depression        Associated symptoms include no decreased concentration and no suicidal ideas.  Edwin [name] presents to the office today for follow-up of bipolar depression and anxiety.  seen July 02, 2019.  No meds were changed.  Patient was doing well.  He asked me to write a letter to his attorney and support his intention to marry a woman from overseas and that his mental health was sufficiently well that he would not represent a danger to his fiance.  This was discussed with his attorney and the letter was written.  Met girl June 8. Met her on dating site international.  [place] woman and decided wanted to get married.  Options opening up after being shut down by Covid.  seen November 2020.  No meds were changed.  He was overall doing well.  seen January 20, 2020 .  No med changed.  Following noted. Wonders about elevated liver enzymes and Depakote.  Labs not visible in Epic.  PCP Dr. Gavin Pound is working up this finding.   Doing great from mental health.  Talks daily to GF in [place].    03/23/20 the following is noted:    Taken on too many students.  Recognizes needs to have 2 days off a week and plans to schedule that.  Needs to get through end of the semester.  Fiance visa is difficult but she just got admitted to [facility] pending some exams being passed.  Visa difficult with Covid affecting travel.  She could be here in mid-July. No med changes  07/06/20 with the following noted: Still good. Pramipexole has kept depression at [place]. Mood is still stable. Good interest in basketball. Still concerned about weight and blames meds and wonders about weight loss meds and supplements.  Asked about L-glutamine taken it  for a week.  Trying to watch diet.  Hasn't weighed himself. Plan: No med changes indicated yet.  Sig risk in switching his primary mood stabilizer unless its absolutely nececessary.  10/05/20 appt noted: Consistent with meds.  Still doing great overall.  Some trouble sleeping and got help with CPAP with Dr. Maxwell Caul.  Tendency to sleep 3 hours and awakened.  May stay awake 1-2 hours and eventually get sufficient sleep.  Can awaken refreshed. No SE except weight gain.   Got denied for Ozempic.  Very concerned about weight gain with Depakote. Still facetiming with GF in [place] and no visa for her yet bc country still shut down. Plan no med changes  01/05/2021 appointment with the following noted: Expressed concerns about weight and new dx DM.   Disc retrying efforts to get Ozempic.   Has increase glucose.   Been on phone with GF in [place] for 20 mos.  It's dragging on trying to get her here.  Thinking about going there.   Previously lost the love of his life DT his illness.  Sense of loss bc her birthday was a couple of days ago.    05/03/2021 appointment with the following noted: Doing as well as anyone can be doing.  Flew to [place] in April.  People were nice.  Enjoyed the family and went with 27 people to the  beach.  GF comes from family of 9.  Going back in July and wedding July 30.  Had Face Timed for 22 mos and gotten to know the family.  She has not been to [place] yet but they plan to live here.  Spousal visas are back logged.   Has seen PCP and dietician and is losing weight. Plan: No med changes indicated yet.  Sig risk in switchin g his primary mood stabilizer unless its absolutely nececessary. Failed attempt to reduce pramipexole below 0.5 mg BID  08/30/21 appt noted: Pretty well.  Got married but she's not here yet.  Working on getting her into the [place].  Has a good immigration attorney here.  Usually takes a just a couple of mos but maybe longer  DT backlog.   Still taking meds  Depakote 1000, duloxetine 120, lamotrigine 200, pramipexole 0.5 mg BID.   Stopped soda.  Working with nutritionist and down 35#. 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   Disc psychology and masculinity goals. No SE.   Satisfied with meds and no indication to change. Plan: continue Depakote 1000, duloxetine 120, lamotrigine 200, pramipexole 0.5 mg BID.   Failed attempt to reduce pramipexole below 0.5 mg BID  01/01/22 appt noted: Going to [employer].   Very good mental health.   New wife is delayed getting here bc slow immigration.  She's in [place] right now.   Got married in [place].   OK with meds. CKD stable. Plan: No med changes indicated yet.  Sig risk in switching his primary mood stabilizer unless its absolutely nececessary. Depakote 1000, duloxetine 120, lamotrigine 200, pramipexole 0.5 mg BID.    05/01/2022 appointment with the following noted: March 15 dizzy and dx with stroke.  Thinks it might be related to untreated OSA bc CPAP machine burned up and couldn't get another.  O2 levels low in hospital in sleep. Appt  to see sleep doc soon.  Sleeping with O2 now.   No depression occurred.  Handled it well.  Lost 52# this year. Thankful for meds.   Sees Dr. Maureen Chatters 7/6 Applied for expedited Visa for wife.  Patient reports stable mood and denies depressed or irritable moods.   Patient denies any recent difficulty with anxiety.  Patient denies difficulty with sleep initiation or maintenance.  Using CPAP.[name] Sleep hygiene is better now and that' s helped.  Going to bed earlier.   Denies appetite disturbance.  Patient reports that energy and motivation have been good.  Patient denies any difficulty with concentration.  Patient denies any suicidal ideation.  No alcohol.    M died 08-05-19.  Was a relief for her.    Past Psychiatric Medication Trials: Lithium increased Cr and switch to VPA 2012,  Depakote 1000, lamotrigine,  gabapentin, Vraylar tremor,  Rexulti SE, Abilify increased weight,   Wellbutrin NR, phentermine,    Psych hosp 1986 helpful   Review of Systems:  Review of Systems  Constitutional:  Negative for unexpected weight change.  Cardiovascular:  Negative for palpitations.  Musculoskeletal:  Positive for arthralgias.  Neurological:  Negative for tremors.  Psychiatric/Behavioral:  Negative for agitation, behavioral problems, confusion, decreased concentration, hallucinations, self-injury, sleep disturbance and suicidal ideas. The patient is not hyperactive.   No longer depressed.   Medications: I have reviewed the patient's current medications.  Current Outpatient Medications  Medication Sig Dispense Refill   Cholecalciferol 1000 units TBDP Take 1 tablet by mouth daily.      clopidogrel (  PLAVIX) 75 MG tablet Take 1 tablet (75 mg total) by mouth daily. 30 tablet 3   dapagliflozin propanediol (FARXIGA) 10 MG TABS tablet Take 10 mg by mouth daily.     diclofenac Sodium (VOLTAREN) 1 % GEL Apply 2 g topically daily as needed (for knee pain).     febuxostat (ULORIC) 40 MG tablet Take 40 mg by mouth daily.     furosemide (LASIX) 20 MG tablet Take 20 mg by mouth daily as needed for edema. May take 2 to 3 days weekly     hydrALAZINE (APRESOLINE) 50 MG tablet Take 1 tablet (50 mg total) by mouth 3 (three) times daily.     LevOCARNitine (L-CARNITINE) 500 MG TABS Take 500 mg by mouth 2 (two) times daily.     losartan (COZAAR) 50 MG tablet Take 2 tablets (100 mg total) by mouth daily.     metFORMIN (GLUCOPHAGE-XR) 500 MG 24 hr tablet Take 500 mg by mouth 2 (two) times daily.     multivitamin-lutein (OCUVITE-LUTEIN) CAPS capsule Take 1 capsule by mouth daily.     divalproex (DEPAKOTE ER) 500 MG 24 hr tablet Take 2 tablets (1,000 mg total) by mouth at bedtime. 180 tablet 1   DULoxetine (CYMBALTA) 60 MG capsule Take 2 capsules (120 mg total) by mouth daily. 180 capsule 1   lamoTRIgine (LAMICTAL) 200 MG tablet Take 1 tablet (200 mg total)  by mouth every morning. 90 tablet 1   pramipexole (MIRAPEX) 0.5 MG tablet Take 1 tablet (0.5 mg total) by mouth 2 (two) times daily. 180 tablet 1   rosuvastatin (CRESTOR) 20 MG tablet Take 1 tablet (20 mg total) by mouth daily. (Patient not taking: Reported on 04/06/2022) 30 tablet 3   No current facility-administered medications for this visit.    Medication Side Effects: Other: weight gain.  Allergies:  Allergies  Allergen Reactions   Allopurinol Other (See Comments)    Made pt emotionally unstable  Other reaction(s): emotionally labile   Penicillins Hives and Other (See Comments)    Has patient had a PCN reaction causing immediate rash, facial/tongue/throat swelling, SOB or lightheadedness with hypotension: No Has patient had a PCN reaction causing severe rash involving mucus membranes or skin necrosis: No Has patient had a PCN reaction that required hospitalization No Has patient had a PCN reaction occurring within the last 10 years: No If all of the above answers are "NO", then may proceed with Cephalosporin use.   Finerenone Other (See Comments)    hyperkalemia Other reaction(s): Increases  potassium levels   Penicillin G     Other reaction(s): hives   Aspirin Hives   Ciprofloxacin Rash    Other reaction(s): rash    Past Medical History:  Diagnosis Date   Allergic rhinitis    Bipolar disorder ([place])    Bradycardia 2012    due to lithium   CKD (chronic kidney disease) stage 2, GFR 60-89 ml/min    Gout    Hypertension    Mild renal insufficiency    , with creatinine of 1.3 in 2010, was side effect of med   Obesity    ,moderate   Prostatitis    , Episodic   Suicide attempt ([place]) 09/20/2014    Family History  Problem Relation Age of Onset   Other Mother        Viral Meningitis   Mitral valve prolapse Mother    Arrhythmia Mother    Hypothyroidism Mother    Stroke Mother    Hypertension  Father    Gout Father    Alzheimer's disease Father     Social History    Socioeconomic History   Marital status: Married    Spouse name: Not on file   Number of children: Not on file   Years of education: Not on file   Highest education level: Not on file  Occupational History   Not on file  Tobacco Use   Smoking status: Never   Smokeless tobacco: Never  Substance and Sexual Activity   Alcohol use: No   Drug use: No   Sexual activity: Not on file  Other Topics Concern   Not on file  Social History Narrative   Not on file   Social Determinants of Health   Financial Resource Strain: Low Risk  (02/22/2022)   Overall Financial Resource Strain (CARDIA)    Difficulty of Paying Living Expenses: Not hard at all  Food Insecurity: No Food Insecurity (02/22/2022)   Hunger Vital Sign    Worried About Running Out of Food in the Last Year: Never true    [place] in the Last Year: Never true  Transportation Needs: No Transportation Needs (02/22/2022)   PRAPARE - Hydrologist (Medical): No    Lack of Transportation (Non-Medical): No  Physical Activity: Not on file  Stress: No Stress Concern Present (02/22/2022)   [place]    Feeling of Stress : Only a little  Social Connections: Not on file  Intimate Partner Violence: Not At Risk (02/22/2022)   Humiliation, Afraid, Rape, and Kick questionnaire    Fear of Current or Ex-Partner: No    Emotionally Abused: No    Physically Abused: No    Sexually Abused: No    Past Medical History, Surgical history, Social history, and Family history were reviewed and updated as appropriate.   Please see review of systems for further details on the patient's review from today.   Objective:   Physical Exam:  There were no vitals taken for this visit.  Physical Exam Constitutional:      General: He is not in acute distress.    Appearance: He is well-developed.  Musculoskeletal:        General: No deformity.  Neurological:      Mental Status: He is alert and oriented to person, place, and time.     Motor: No tremor.     Coordination: Coordination normal.     Gait: Gait normal.  Psychiatric:        Attention and Perception: Attention normal. He is attentive.        Mood and Affect: Mood is not anxious or depressed. Affect is not labile, blunt, angry or inappropriate.        Speech: Speech is not rapid and pressured.        Behavior: Behavior normal.        Thought Content: Thought content normal. Thought content is not delusional. Thought content does not include homicidal or suicidal ideation. Thought content does not include suicidal plan.        Cognition and Memory: Cognition normal.        Judgment: Judgment normal.     Comments: Insight is good.  Some situational anxiety managed     Lab Review:     Component Value Date/Time   NA 140 02/04/2022 0155   K 4.4 02/04/2022 0155   CL 105 02/04/2022 0155   CO2 23  02/04/2022 0129   GLUCOSE 120 (H) 02/04/2022 0155   BUN 41 (H) 02/04/2022 0155   CREATININE 1.90 (H) 02/04/2022 0155   CALCIUM 9.3 02/04/2022 0129   PROT 7.4 02/04/2022 0129   ALBUMIN 4.3 02/04/2022 0129   AST 23 02/04/2022 0129   ALT 35 02/04/2022 0129   ALKPHOS 62 02/04/2022 0129   BILITOT 0.3 02/04/2022 0129   GFRNONAA 44 (L) 02/04/2022 0129   GFRAA >60 02/04/2017 0525       Component Value Date/Time   WBC 10.1 02/04/2022 0129   RBC 5.07 02/04/2022 0129   HGB 15.3 02/04/2022 0155   HCT 45.0 02/04/2022 0155   PLT 251 02/04/2022 0129   MCV 92.1 02/04/2022 0129   MCH 31.2 02/04/2022 0129   MCHC 33.8 02/04/2022 0129   RDW 12.8 02/04/2022 0129   LYMPHSABS 2.2 02/04/2022 0129   MONOABS 0.8 02/04/2022 0129   EOSABS 0.2 02/04/2022 0129   BASOSABS 0.1 02/04/2022 0129    No results found for: "POCLITH", "LITHIUM"   Lab Results  Component Value Date   VALPROATE <10.0 (L) 09/22/2014    Sleep study dx severe OSA AHI 53.   .res Assessment: Plan:    Ameet was seen today for  follow-up and post-traumatic stress disorder.  Diagnoses and all orders for this visit:  Bipolar II disorder ([place]) -     divalproex (DEPAKOTE ER) 500 MG 24 hr tablet; Take 2 tablets (1,000 mg total) by mouth at bedtime. -     lamoTRIgine (LAMICTAL) 200 MG tablet; Take 1 tablet (200 mg total) by mouth every morning. -     pramipexole (MIRAPEX) 0.5 MG tablet; Take 1 tablet (0.5 mg total) by mouth 2 (two) times daily.  PTSD (post-traumatic stress disorder) -     DULoxetine (CYMBALTA) 60 MG capsule; Take 2 capsules (120 mg total) by mouth daily.  Obstructive sleep apnea  Greater than 50% of 30 min face to face time with patient was spent on counseling and coordination of care. We discussed  Mood remains stable . Switch from lithium to VPA in 2012 was followed up with CMP which was normal at the time except for elevated ammonia level.  Which was the reason for adding L-carnitine and this corrected the ammonia level.   Elevated liver enzymes resolved.  Supportive therapy dealing with his goal to get new wife to [place]  Good response to the med combination.  Polypharmacy necessary.   Since 2017 on pramipexole has been much better with regard to mood stability.  He feels it causes weight gain but cannot maintain stability bc of it.  Disc weight loss strategies.  He's seeing dietician.  He's lost 18#.  Again Disc Ozempic as potential for weight loss and now the newer similar med.   Gave JAMA abstract at previous visit.[name]   Disc Wegovy.  Disc weight losss at length.   Disc diet and need to control blood sugar and weight loss are all needed.  Using O2 consistently and he requires less sleep.  Disc sleep hygiene and getting adequate quantity for mental health.  Sleep deprivation increases risk mania.  Sees Dr. Maureen Chatters 7/6  No med changes indicated.  Sig risk in switching his primary mood stabilizer unless its absolutely nececessary. Depakote 1000, duloxetine 120, lamotrigine 200, pramipexole 0.5 mg BID.     Failed attempt to reduce pramipexole below 0.5 mg BID Disc risk impulsivity and mania with this but he's had depression benefit without problems.  30 min  FU 4  mos  Lynder Parents, MD, DFAPA   Please see After Visit Summary for patient specific instructions.  Future Appointments  Date Time Provider [place]  05/24/2022  9:00 AM Dohmeier, Asencion Partridge, MD [facility] None  10/09/2022  8:30 AM Ward Givens, NP [facility] None     No orders of the defined types were placed in this encounter.      -------------------------------

## 2022-05-03 DIAGNOSIS — N401 Enlarged prostate with lower urinary tract symptoms: Secondary | ICD-10-CM | POA: Diagnosis not present

## 2022-05-03 DIAGNOSIS — R972 Elevated prostate specific antigen [PSA]: Secondary | ICD-10-CM | POA: Diagnosis not present

## 2022-05-03 DIAGNOSIS — N138 Other obstructive and reflux uropathy: Secondary | ICD-10-CM | POA: Diagnosis not present

## 2022-05-14 DIAGNOSIS — R0600 Dyspnea, unspecified: Secondary | ICD-10-CM | POA: Diagnosis not present

## 2022-05-16 DIAGNOSIS — L821 Other seborrheic keratosis: Secondary | ICD-10-CM | POA: Diagnosis not present

## 2022-05-16 DIAGNOSIS — D225 Melanocytic nevi of trunk: Secondary | ICD-10-CM | POA: Diagnosis not present

## 2022-05-17 DIAGNOSIS — G4734 Idiopathic sleep related nonobstructive alveolar hypoventilation: Secondary | ICD-10-CM | POA: Diagnosis not present

## 2022-05-22 ENCOUNTER — Emergency Department (HOSPITAL_BASED_OUTPATIENT_CLINIC_OR_DEPARTMENT_OTHER): Payer: BC Managed Care – PPO | Admitting: Radiology

## 2022-05-22 ENCOUNTER — Emergency Department (HOSPITAL_BASED_OUTPATIENT_CLINIC_OR_DEPARTMENT_OTHER)
Admission: EM | Admit: 2022-05-22 | Discharge: 2022-05-22 | Disposition: A | Discharge status: Home / Self Care | Payer: BC Managed Care – PPO | Attending: Emergency Medicine | Admitting: Emergency Medicine

## 2022-05-22 ENCOUNTER — Other Ambulatory Visit: Payer: Self-pay

## 2022-05-22 DIAGNOSIS — E119 Type 2 diabetes mellitus without complications: Secondary | ICD-10-CM | POA: Insufficient documentation

## 2022-05-22 DIAGNOSIS — I129 Hypertensive chronic kidney disease with stage 1 through stage 4 chronic kidney disease, or unspecified chronic kidney disease: Secondary | ICD-10-CM | POA: Insufficient documentation

## 2022-05-22 DIAGNOSIS — S300XXA Contusion of lower back and pelvis, initial encounter: Secondary | ICD-10-CM | POA: Diagnosis not present

## 2022-05-22 DIAGNOSIS — Z79899 Other long term (current) drug therapy: Secondary | ICD-10-CM | POA: Diagnosis not present

## 2022-05-22 DIAGNOSIS — Z7984 Long term (current) use of oral hypoglycemic drugs: Secondary | ICD-10-CM | POA: Diagnosis not present

## 2022-05-22 DIAGNOSIS — S3992XA Unspecified injury of lower back, initial encounter: Secondary | ICD-10-CM | POA: Diagnosis not present

## 2022-05-22 DIAGNOSIS — N183 Chronic kidney disease, stage 3 unspecified: Secondary | ICD-10-CM | POA: Diagnosis not present

## 2022-05-22 DIAGNOSIS — Z7901 Long term (current) use of anticoagulants: Secondary | ICD-10-CM | POA: Insufficient documentation

## 2022-05-22 DIAGNOSIS — S20222A Contusion of left back wall of thorax, initial encounter: Secondary | ICD-10-CM

## 2022-05-22 DIAGNOSIS — Z043 Encounter for examination and observation following other accident: Secondary | ICD-10-CM | POA: Diagnosis not present

## 2022-05-22 DIAGNOSIS — W010XXA Fall on same level from slipping, tripping and stumbling without subsequent striking against object, initial encounter: Secondary | ICD-10-CM | POA: Diagnosis not present

## 2022-05-22 MED ORDER — ACETAMINOPHEN 500 MG PO TABS
1000.0000 mg | ORAL_TABLET | Freq: Once | ORAL | Status: AC
  Administered 2022-05-22: 1000 mg via ORAL
  Filled 2022-05-22: qty 2

## 2022-05-22 MED ORDER — KETOROLAC TROMETHAMINE 60 MG/2ML IM SOLN
30.0000 mg | Freq: Once | INTRAMUSCULAR | Status: AC
  Administered 2022-05-22: 30 mg via INTRAMUSCULAR
  Filled 2022-05-22: qty 2

## 2022-05-22 NOTE — Discharge Instructions (Signed)
For pain control you may take at 1000 mg of Tylenol every 8 hours as needed. 

## 2022-05-22 NOTE — ED Triage Notes (Signed)
Pt via POV from home. States he lost his balance falling backwards onto a keyboard approx 1 hr PTA. Injury to mid lower back. No head injury. No LOC. Was able to pull himself up. Per note pt on plavix. Ambulatory in triage.

## 2022-05-22 NOTE — ED Provider Notes (Signed)
[facility] EMERGENCY DEPT Provider Note  CSN: [id number] Arrival date & time: 05/22/22 7564  Chief Complaint(s) Fall  HPI Edwin [name] is a 66 y.o. male who presents to the emergency department for lower back contusion after a mechanical fall.  Patient reports bending over and losing his balance causing him to fall backwards and hitting his lower back on a piano keyboard case.  Patient denies any other injuries related to the fall.  Pain is moderate and worse with palpation.  No lower extremity weakness or loss of sensation.  No bladder/bowel incontinence.  Patient is not anticoagulated.  The history is provided by the patient.    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/2022   Allergic rhinitis 02/22/2022   Bipolar disorder in remission (HCC) 02/22/2022   Body mass index (BMI) 37.0-37.9, adult 02/22/2022   Bradycardia 02/22/2022   Cat scratch of forearm 02/22/2022   Cerebral infarction (HCC) 02/22/2022   Closed fracture of metatarsal bone 02/22/2022   Constipation 02/22/2022   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   Dyslipidemia 02/22/2022   Edema 02/22/2022   Electrocardiogram abnormal 02/22/2022   Elevated PSA 02/22/2022   Enthesopathy 02/22/2022   Erectile dysfunction 02/22/2022   History of small bowel obstruction 02/22/2022   Hyperglycemia 02/22/2022   Hyperglycemia due to type 2 diabetes mellitus (HCC) 02/22/2022   Insomnia 02/22/2022    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   Impairment of balance 02/22/2022   Major depression, single episode 02/22/2022   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   Melena 02/22/2022   Mixed hyperlipidemia 02/22/2022   Obstructive sleep apnea syndrome 02/22/2022   Other long term (current) drug therapy 02/22/2022   Encounter for immunization 02/22/2022   Pneumonia due to SARS-associated coronavirus 02/22/2022   Right lower quadrant pain 02/22/2022   Severe recurrent major depression without psychotic features (HCC) 02/22/2022   Umbilical hernia 02/22/2022   Vitamin D deficiency 02/22/2022   Left-sided weakness 02/04/2022   Diabetes mellitus type 2 in nonobese (HCC) 02/04/2022   CVA (cerebral vascular accident) (HCC) 02/04/2022   Enteritis of ileum 02/02/2017   Intestinal obstruction (HCC) 02/02/2017   Persistent recurrent vomiting 02/01/2017   Chronic kidney disease, stage 3 unspecified (HCC) 05/19/2016   Abdominal pain 05/19/2016   Hypokalemia 05/19/2016   Primary gout    Bipolar affective disorder, currently depressed, moderate (HCC) 09/20/2014   Hypertension 11/23/2013   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 Authorizing Provider  Cholecalciferol 1000 units TBDP Take 1 tablet by mouth daily.     [provider]  clopidogrel (PLAVIX) 75 MG tablet Take 1 tablet (75 mg total) by mouth daily. 02/06/22   Ghimire, Werner Lean, MD  dapagliflozin propanediol (FARXIGA) 10 MG TABS tablet Take 10 mg by mouth daily.  [provider]  diclofenac Sodium (VOLTAREN) 1 % GEL Apply 2 g topically daily as needed (for knee pain).    [provider]  divalproex (DEPAKOTE ER) 500 MG 24 hr tablet Take 2 tablets (1,000 mg total) by mouth at bedtime. 05/01/22   Cottle, Steva Ready., MD  DULoxetine (CYMBALTA) 60 MG capsule Take 2 capsules (120 mg total) by mouth daily.  05/01/22   Cottle, Steva Ready., MD  febuxostat (ULORIC) 40 MG tablet Take 40 mg by mouth daily.    [provider]  furosemide (LASIX) 20 MG tablet Take 20 mg by mouth daily as needed for edema. May take 2 to 3 days weekly 07/21/19   [provider]  hydrALAZINE (APRESOLINE) 50 MG tablet Take 1 tablet (50 mg total) by mouth 3 (three) times daily. 02/06/22   Ghimire, Werner Lean, MD  lamoTRIgine (LAMICTAL) 200 MG tablet Take 1 tablet (200 mg total) by mouth every morning. 05/01/22   Cottle, Steva Ready., MD  LevOCARNitine (L-CARNITINE) 500 MG TABS Take 500 mg by mouth 2 (two) times daily.    [provider]  losartan (COZAAR) 50 MG tablet Take 2 tablets (100 mg total) by mouth daily. 02/06/22   Ghimire, Werner Lean, MD  metFORMIN (GLUCOPHAGE-XR) 500 MG 24 hr tablet Take 500 mg by mouth 2 (two) times daily. 01/07/22   [provider]  multivitamin-lutein (OCUVITE-LUTEIN) CAPS capsule Take 1 capsule by mouth daily.    [provider]  pramipexole (MIRAPEX) 0.5 MG tablet Take 1 tablet (0.5 mg total) by mouth 2 (two) times daily. 05/01/22   Cottle, Steva Ready., MD  rosuvastatin (CRESTOR) 20 MG tablet Take 1 tablet (20 mg total) by mouth daily. Patient not taking: Reported on 04/06/2022 02/06/22   Maretta Bees, MD                                                                                                                                    Allergies Allopurinol, Penicillins, Finerenone, Penicillin g, Aspirin, and Ciprofloxacin  Review of Systems Review of Systems As noted in HPI  Physical Exam Vital Signs  I have reviewed the triage vital signs BP 129/78 (BP Location: Right Arm)   Pulse 68   Temp (!) 97.5 F (36.4 C) (Oral)   Resp 20   Ht 5\' 10"  (1.778 m)   Wt 98 kg   SpO2 100%   BMI 30.99 kg/m   Physical Exam Vitals reviewed.  Constitutional:      General: He is not in acute distress.    Appearance: He is well-developed. He is not diaphoretic.   HENT:     Head: Normocephalic and atraumatic.     Right Ear: External ear normal.     Left Ear: External ear normal.     Nose: Nose normal.     Mouth/Throat:     Mouth: Mucous membranes are moist.  Eyes:     General: No scleral icterus.    Conjunctiva/sclera: Conjunctivae normal.  Neck:     Trachea: Phonation normal.  Cardiovascular:     Rate and Rhythm: Normal rate and regular rhythm.  Pulmonary:     Effort: Pulmonary effort is normal. No respiratory distress.     Breath sounds: No stridor.  Chest:     Chest wall: No tenderness.  Abdominal:     General: There is no distension.     Tenderness: There is no abdominal tenderness.  Musculoskeletal:        General: Normal range of motion.     Cervical back: Normal range of motion. No tenderness.     Thoracic back: No tenderness or bony tenderness.     Lumbar back: Swelling, signs of trauma and tenderness present. No lacerations or bony tenderness.       Back:  Neurological:     Mental Status: He is alert and oriented to person, place, and time.  Psychiatric:        Behavior: Behavior normal.     ED Results and Treatments Labs (all labs ordered are listed, but only abnormal results are displayed) Labs Reviewed - No data to display                                                                                                                       EKG  EKG Interpretation  Date/Time:    Ventricular Rate:    PR Interval:    QRS Duration:   QT Interval:    QTC Calculation:   R Axis:     Text Interpretation:         Radiology No results found.  Pertinent labs & imaging results that were available during my care of the patient were reviewed by me and considered in my medical decision making (see MDM for details).  Medications Ordered in ED Medications  ketorolac (TORADOL) injection 30 mg (30 mg Intramuscular Given 05/22/22 0640)  acetaminophen (TYLENOL) tablet 1,000 mg (1,000 mg Oral Given 05/22/22 4098)                                                                                                                                      Procedures Procedures  (including critical care time)  Medical Decision Making / ED Course    Complexity of Problem:  Patient's presenting problem/concern, DDX, and MDM  listed below: Mechanical fall resulting in left lower back injury We will get x-ray to assess for any fraction Low suspicion for other serious internal injuries requiring labs or additional imaging.     Complexity of Data:   Imaging Studies ordered listed below with my independent interpretation: X-ray without acute injury.     ED Course:    Assessment, Add'l Intervention, and Reassessment: Mechanical fall resulting in left lower back contusion Supportive management recommended    Final Clinical Impression(s) / ED Diagnoses Final diagnoses:  Back contusion, left, initial encounter   The patient appears reasonably screened and/or stabilized for discharge and I doubt any other medical condition or other [facility] requiring further screening, evaluation, or treatment in the ED at this time prior to discharge. Safe for discharge with strict return precautions.  Disposition: Discharge  Condition: Good  I have discussed the results, Dx and Tx plan with the patient/family who expressed understanding and agree(s) with the plan. Discharge instructions discussed at length. The patient/family was given strict return precautions who verbalized understanding of the instructions. No further questions at time of discharge.    Follow Up: Marden Noble, MD [address] [place] [address] [phone]  Call  as needed           This chart was dictated using voice recognition software.  Despite best efforts to proofread,  errors can occur which can change the documentation meaning.    Nira Conn, MD 05/23/22 1207

## 2022-05-24 ENCOUNTER — Encounter: Payer: Self-pay | Admitting: Neurology

## 2022-05-24 ENCOUNTER — Ambulatory Visit (INDEPENDENT_AMBULATORY_CARE_PROVIDER_SITE_OTHER): Payer: BC Managed Care – PPO | Admitting: Neurology

## 2022-05-24 VITALS — BP 139/85 | HR 82 | Ht 70.0 in | Wt 222.0 lb

## 2022-05-24 DIAGNOSIS — R634 Abnormal weight loss: Secondary | ICD-10-CM

## 2022-05-24 DIAGNOSIS — G4721 Circadian rhythm sleep disorder, delayed sleep phase type: Secondary | ICD-10-CM | POA: Diagnosis not present

## 2022-05-24 DIAGNOSIS — G4719 Other hypersomnia: Secondary | ICD-10-CM

## 2022-05-24 DIAGNOSIS — I639 Cerebral infarction, unspecified: Secondary | ICD-10-CM

## 2022-05-24 DIAGNOSIS — G4733 Obstructive sleep apnea (adult) (pediatric): Secondary | ICD-10-CM

## 2022-05-24 NOTE — Patient Instructions (Signed)
have the pleasure of meeting today with Mr. Edan Juday. 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  He was left with dysesthesias sensory changes affecting the left side of his body the stability of his gait and he had initially some dizziness as well.  Risk factors for small vessel strokes are diabetes, hypertension hypercholesterolemia.  He also voiced that he had been treated for obstructive sleep apnea with CPAP but never really enjoyed it this therapy and has not been adherent to it for over a year.  His sleep study took place before the pandemic probably in 2019 or 18.  In the meantime he has lost a significant amount of weight over 50 pounds and it is likely that his sleep apnea type and intensity could have changed with the stroke as well.  During his hospitalization he was on telemetry and the alarm of the pulse oximetry went off frequently indicating that he was hypoxic at night but I do not know if it was related to obstructive or central sleep apnea at the time.  In order to offer alternative therapies to CPAP we have to qualify his underlying sleep apnea, central sleep apnea and apnea associated with hypoxia is usually not responsive to a dental device or inspire device as both work by protecting the upper airway but do not help with deep breathing there is no added pressure.  He also may have never tried BiPAP or another mood of positive airway pressure that could be more comfortable for him.  His described insomnia is not as bothersome to him as he has a lot of liberty with his work hours and assignments but he does state that he dozes off while giving [employer].   1) CVA, acute in march 2023 2) OSA diagnosed when he was 275 pounds , not on CPAP currently.  3) EDS 4) Insomnia related to his particular sleep habits and entrained rhythms.    My Plan is to proceed with:  1) Attended sleep study- given the hospital  report of hypoxia and need of oxygen by cannula during his stay in March at [facility] hospital  he should undergo a SPLIT night study.  I need reliable central versus Obstructive apnea data.  2) I encouraged weight control, current BMI 31.8 3) he is well controlled for bipolar disorder,   I would like to thank Marden Noble, MD and Butch Penny, Np [address] [place],  [place] [address] for allowing me to meet with and to take care of this pleasant patient.   In short, Edwin [name] is presenting with EDS after CVA untreated OSA, ,I plan to follow up either personally or through our NP within 2-3 months.   CC: I will share my notes with PCP.  Electronically signed by: Melvyn Novas, MD 05/24/2022 9:03 AM  [facility] Board certified by The [employer] of Sleep Medicine and Diplomate of the [employer] of [employer]. Board certified In Neurology through the ABPN, Fellow of the [employer] of Neu[employer]. Medical Director of [facility].

## 2022-05-24 NOTE — Progress Notes (Signed)
SLEEP MEDICINE CLINIC    Provider:  Melvyn Novas, Edwin [name]  Primary Care Physician:  Marden Noble, Edwin [name] [address] [place] [address]     Referring Provider: Butch Penny, Np [address] [place],  [place] [address]          Chief Complaint according to patient   Patient presents with:     New Patient (Initial Visit)           HISTORY OF PRESENT ILLNESS:  Edwin [name] is a 66 y.o. year old White or Caucasian male patient seen here as a referral on 05/24/2022 from  Butch Penny, NP VIA Dr Pearlean Brownie, Edwin [name] . Chief concern according to patient :  I have seen dr Earl Gala at [facility] for several years. Pt referred for sleep consult. Dx with OSA and would like to try different option, like a mouth piece. Pts accidentally burnt/broke his CPAP when travelling, last used 02/2021. Last SS done in 2021 through [facility]. DME [facility] at the time. Had a Resmed CPAP. He lost 50 pounds since his last sleep test 2019. He has voiced interest in dental device or inspire.   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  Balance was off, handwriting was affected. Here is his ED report:  Edwin [name] is a 66 y.o. male with medical history significant of DM2, HTN, hypogonadism, bipolar d/o, gout. Presenting with left side numbness. He reports 4 days ago when he woke up, he thought he was having an episode of vertigo. He was dizzy and nauseous. He vomited once. He decided to take the rest of the day off to rest. The next day he was feeling better overall, but not 100%. He had a fall that day where he hit his head. He didn't have any LOS. He saw his PCP and was given meclizine. The next day day he was for the most part ok until he went to sleep. That night prior to sleep, he felt some tingling on his left side. When he woke up yesterday, the tingling had intensified. It went back and forth between  tingling and numbness throughout the day. His unsteadiness seemed worse as he was having difficulty walking. When his symptoms did not resolve last night, he decided to come to the ED for help. He denies any other aggravating or alleviating factors.      Edwin [name]  was followed by Dr Pearlean Brownie, Edwin [name]. MRI brain/MRA H&N: 1. Small acute infarct in the Right Medullary Pyramid (medullary perforator artery territory, see #2). No associated acute hemorrhage or mass effect. 2. 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 No large vessel occlusion. 3. Intracranial MRA is degraded.  No large vessel occlusion. 4. Small chronic infarct in the right cerebellum, and evidence of a chronic micro-hemorrhage in the left midbrain. Mild for age superimposed cerebral white matter signal changes, most commonly due to small vessel disease.  During his hospitalization he was hypoxic , the alarm went off many times- OSA ?   and has a past medical history of Allergic rhinitis, Bipolar disorder (HCC), Bradycardia (2012), BPH, CVA -3/ 2023, CKD (chronic kidney disease) stage 2, GFR 60-89 ml/min, Gout, Hypertension, Mild renal insufficiency, Obesity, Prostatitis, and Suicide attempt (HCC) (09/20/2014).   The patient  had the first sleep study in the year 03/05/2018 at [facility]/ Dr Earl Gala. AHI was 53/ h.  Sleep relevant history: He sleeps longer , no trouble to go to sleep, feels rested after 4 hours , and he may get another 2-4 hours after a break. Nocturia with BPH, Tonsillectomy at age 85-3,  palate expander-braces.   Family medical /sleep history:  No other family member on CPAP with OSA, insomnia, sleep walkers. mother had a CVA at 79. Passed away in 03-06-19.    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  and lives in a household alone ( since 03/05/2014). Family status is married , without  children,. His parents were teachers, father was a Nurse, mental health.  The patient currently works evenings and nights. He teaches in daytime.  Tobacco use; none .  ETOH use: none  ,  Caffeine intake in form of Coffee( /) Soda(  quit 2021-03-05) Tea ( 3-5 glasses a day) or energy drinks. Hobbies : music, walking,       Sleep habits are as follows: The patient's dinner time is between 8 PM. The patient goes to bed at 2-3 AM and continues to sleep for 2 intervals of 3-4 hours hours, wakes for 1-2 bathroom breaks.   The preferred sleep position is now supine, with the support of 1-2 pillows.  Dreams are reportedly frequent.  He wakes nearly every night earlier than desired.  9-11  AM is the usual rise time. The patient wakes up spontaneously.  He reports  feeling refreshed or restored in AM,  Naps are taken infrequently, lasting from 45 to 60 minutes.      Review of Systems: Out of a complete 14 system review, the patient complains of only the following symptoms, and all other reviewed systems are negative.:  Not on CPAP -, snoring, fragmented sleep, Insomnia in the past- one break every night.    How likely are you to doze in the following situations: 0 = not likely, 1 = slight chance, 2 = moderate chance, 3 = high chance   Sitting and Reading? Watching Television? Sitting inactive in a public place (theater or meeting)? As a passenger in a car for an hour without a break? Lying down in the afternoon when circumstances permit? Sitting and talking to someone? Sitting quietly after lunch without alcohol? In a car, while stopped for a few minutes in traffic?   Total = 6/ 24 points   FSS endorsed at 18/ 63 points.   Social History   Socioeconomic History   Marital status: Married    Spouse name: Sweet   Number of children: 0   Years of education: Not on file   Highest education level: Master's degree (e.g., MA, MS, MEng, MEd, MSW, MBA)  Occupational History   Not on file  Tobacco  Use   Smoking status: Never   Smokeless tobacco: Never  Substance and Sexual Activity   Alcohol use: No   Drug use: No   Sexual activity: Not on file  Other Topics Concern   Not on file  Social History Narrative   Lives alone   Left handed   Caffeine: 2 ice tea a day   Social Determinants of Health   Financial Resource Strain: Low Risk  (03-05-2022)   Overall Financial Resource Strain (CARDIA)    Difficulty of Paying Living Expenses: Not hard at all  Food Insecurity: No Food Insecurity (03/05/22)   Hunger Vital Sign    Worried About  Running Out of Food in the Last Year: Never true    [place] in the Last Year: Never true  Transportation Needs: No Transportation Needs (02/22/2022)   PRAPARE - Hydrologist (Medical): No    Lack of Transportation (Non-Medical): No  Physical Activity: Not on file  Stress: No Stress Concern Present (02/22/2022)   [place]    Feeling of Stress : Only a little  Social Connections: Not on file    Family History  Problem Relation Age of Onset   Other Mother        Viral Meningitis   Mitral valve prolapse Mother    Arrhythmia Mother    Hypothyroidism Mother    Stroke Mother    Hypertension Father    Gout Father    Alzheimer's disease Father     Past Medical History:  Diagnosis Date   Allergic rhinitis    Bipolar disorder ([place])    Bradycardia 2012    due to lithium   CKD (chronic kidney disease) stage 2, GFR 60-89 ml/min    Gout    Hypertension    Mild renal insufficiency    , with creatinine of 1.3 in 2010, was side effect of med   Obesity    ,moderate   Prostatitis    , Episodic   Suicide attempt ([place]) 09/20/2014    Past Surgical History:  Procedure Laterality Date   APPENDECTOMY     CATARACT EXTRACTION Right    COLONOSCOPY WITH PROPOFOL N/A 06/14/2014   Procedure: COLONOSCOPY WITH PROPOFOL;  Surgeon: Garlan Fair, Edwin [name];   Location: [facility] END[facility]COPY;  Service: Endoscopy;  Laterality: N/A;   TONSILLECTOMY     UMBILICAL HERNIA REPAIR       Current Outpatient Medications on File Prior to Visit  Medication Sig Dispense Refill   Cholecalciferol 1000 units TBDP Take 1 tablet by mouth daily.      clopidogrel (PLAVIX) 75 MG tablet Take 1 tablet (75 mg total) by mouth daily. 30 tablet 3   dapagliflozin propanediol (FARXIGA) 10 MG TABS tablet Take 10 mg by mouth daily.     diclofenac Sodium (VOLTAREN) 1 % GEL Apply 2 g topically daily as needed (for knee pain).     divalproex (DEPAKOTE ER) 500 MG 24 hr tablet Take 2 tablets (1,000 mg total) by mouth at bedtime. 180 tablet 1   DULoxetine (CYMBALTA) 60 MG capsule Take 2 capsules (120 mg total) by mouth daily. 180 capsule 1   febuxostat (ULORIC) 40 MG tablet Take 40 mg by mouth daily.     furosemide (LASIX) 20 MG tablet Take 20 mg by mouth daily as needed for edema. May take 2 to 3 days weekly     hydrALAZINE (APRESOLINE) 50 MG tablet Take 1 tablet (50 mg total) by mouth 3 (three) times daily.     lamoTRIgine (LAMICTAL) 200 MG tablet Take 1 tablet (200 mg total) by mouth every morning. 90 tablet 1   LevOCARNitine (L-CARNITINE) 500 MG TABS Take 500 mg by mouth 2 (two) times daily.     losartan (COZAAR) 50 MG tablet Take 2 tablets (100 mg total) by mouth daily.     metFORMIN (GLUCOPHAGE-XR) 500 MG 24 hr tablet Take 500 mg by mouth 2 (two) times daily.     multivitamin-lutein (OCUVITE-LUTEIN) CAPS capsule Take 1 capsule by mouth daily.     pramipexole (MIRAPEX) 0.5 MG tablet Take 1 tablet (0.5  mg total) by mouth 2 (two) times daily. 180 tablet 1   No current facility-administered medications on file prior to visit.    Allergies  Allergen Reactions   Allopurinol Other (See Comments)    Made pt emotionally unstable  Other reaction(s): emotionally labile   Penicillins Hives and Other (See Comments)    Has patient had a PCN reaction causing immediate rash, facial/tongue/throat  swelling, SOB or lightheadedness with hypotension: No Has patient had a PCN reaction causing severe rash involving mucus membranes or skin necrosis: No Has patient had a PCN reaction that required hospitalization No Has patient had a PCN reaction occurring within the last 10 years: No If all of the above answers are "NO", then may proceed with Cephalosporin use.   Finerenone Other (See Comments)    hyperkalemia Other reaction(s): Increases  potassium levels   Penicillin G     Other reaction(s): hives   Aspirin Hives   Ciprofloxacin Rash    Other reaction(s): rash    Physical exam:  Today's Vitals   05/24/22 0838  BP: 139/85  Pulse: 82  Weight: 222 lb (100.7 kg)  Height: 5\' 10"  (1.778 m)   Body mass index is 31.85 kg/m.   Wt Readings from Last 3 Encounters:  05/24/22 222 lb (100.7 kg)  05/22/22 216 lb (98 kg)  03/22/22 227 lb 6.4 oz (103.1 kg)     Ht Readings from Last 3 Encounters:  05/24/22 5\' 10"  (1.778 m)  05/22/22 5\' 10"  (1.778 m)  03/22/22 5\' 10"  (1.778 m)      General: The patient is awake, alert and appears not in acute distress. The patient is well groomed. Head: Normocephalic, atraumatic.  Neck is supple. Mallampati 3, small oral opening neck circumference:18 inches . Nasal airflow patent.  Retrognathia is mild seen.  Dental status: braces Cardiovascular:  Regular rate and cardiac rhythm by pulse,  without distended neck veins. Respiratory: Lungs are clear to auscultation.  Skin:  With evidence of ankle edema, bluish feet. Trunk: The patient's posture is erect.   Neurologic exam : The patient is awake and alert, oriented to place and time.   Memory subjective described as intact.  Attention span & concentration ability appears normal.  Speech is fluent,  without dysarthria, dysphonia or aphasia.  Mood and affect are appropriate.   Cranial nerves: no loss of smell or taste reported  Pupils are equal and briskly reactive to light. Funduscopic exam  deferred.  Extraocular movements in vertical and horizontal planes were intact and without nystagmus. No Diplopia. Visual fields by finger perimetry are intact. Hearing was intact to soft voice and finger rubbing.    Facial sensation intact to fine touch.  Facial motor strength is asymmetric but tongue was  midline.  Neck ROM : rotation, tilt and flexion extension were normal for age and shoulder shrug was symmetrical.    Motor exam:  Symmetric bulk, tone and ROM.  pronator drift on the left.  Normal tone without cog- wheeling, symmetric grip strength .   Sensory:   Fine touch, pinprick and vibration were tested  and  normal.  He felt tuning fork vibration on both feet and ankles,  Proprioception tested in the upper extremities was normal.   Coordination: Rapid alternating movements in the fingers/hands were of normal speed.  The Finger-to-nose maneuver was intact without evidence of ataxia, dysmetria or tremor.   Gait and station: Patient could rise unassisted from a seated position, walked without assistive device.  Stance is of  normal width/ base .  Toe and heel walk were deferred.  Deep tendon reflexes: in the  upper and lower extremities are symmetric and intact.  Babinski response was deferred.        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  He was left with dysesthesias sensory changes affecting the left side of his body the stability of his gait and he had initially some dizziness as well.  Risk factors for small  vessel strokes are diabetes, hypertension hypercholesterolemia.  He also voiced that he had been treated for obstructive sleep apnea with CPAP but never really enjoyed it this therapy and has not been adherent to it for over a year.  His sleep study took place before the pandemic probably in 2019 or 18.  In the meantime he has lost a significant amount of weight over 50 pounds and it is likely that his sleep apnea type and intensity could have changed with the stroke as well.  During his hospitalization he was on telemetry and the alarm of the pulse oximetry went off frequently indicating that he was hypoxic at night but I do not know if it was related to obstructive or central sleep apnea at the time.  In order to offer alternative therapies to CPAP we have to qualify his underlying sleep apnea, central sleep apnea and apnea associated with hypoxia is usually not responsive to a dental device or inspire device as both work by protecting the upper airway but do not help with deep breathing there is no added pressure.  He also may have never tried BiPAP or another mood of positive airway pressure that could be more comfortable for him.  His described insomnia is not as bothersome to him as he has a lot of liberty with his work hours and assignments but he does state that he dozes off while giving [employer].   1) CVA, acute in march 2023 2) OSA diagnosed when he was 275 pounds , not on CPAP currently.  3) EDS 4) Insomnia related to his particular sleep habits and entrained rhythms.    My Plan is to proceed with:  1) Attended sleep study- given the hospital report of hypoxia and need of oxygen by cannula during his stay in March at [facility] hospital  he should undergo a SPLIT night study.  I need reliable central versus Obstructive apnea data.  2) I encouraged weight control, current BMI 31.8 3) he is well controlled for bipolar disorder,   I would like to thank Josetta Huddle, Edwin [name] and Ward Givens,  [address] [place],  [place] [address] for allowing me to meet with and to take care of this pleasant patient.   In short, Edwin [name] is presenting with EDS after CVA untreated OSA, ,I plan to follow up either personally or through our NP within 2-3 months.   CC: I will share my notes with PCP.  Electronically signed by: Larey Seat, Edwin [name] 05/24/2022 9:03 AM  [facility] and [employer] [facility] Board certified  by The American Board of Sleep Medicine and Diplomate of the [employer] of Sleep Medicine. Board certified In Neurology through the [place], Fellow of the [employer] of Neurology. Medical Director of [employer].

## 2022-05-31 DIAGNOSIS — E119 Type 2 diabetes mellitus without complications: Secondary | ICD-10-CM | POA: Diagnosis not present

## 2022-06-02 ENCOUNTER — Emergency Department (HOSPITAL_BASED_OUTPATIENT_CLINIC_OR_DEPARTMENT_OTHER): Payer: BC Managed Care – PPO

## 2022-06-02 ENCOUNTER — Encounter (HOSPITAL_BASED_OUTPATIENT_CLINIC_OR_DEPARTMENT_OTHER): Payer: Self-pay | Admitting: Emergency Medicine

## 2022-06-02 ENCOUNTER — Emergency Department (HOSPITAL_COMMUNITY): Payer: BC Managed Care – PPO

## 2022-06-02 ENCOUNTER — Emergency Department (HOSPITAL_BASED_OUTPATIENT_CLINIC_OR_DEPARTMENT_OTHER)
Admission: EM | Admit: 2022-06-02 | Discharge: 2022-06-02 | Disposition: A | Discharge status: Home / Self Care | Payer: BC Managed Care – PPO | Attending: Emergency Medicine | Admitting: Emergency Medicine

## 2022-06-02 ENCOUNTER — Other Ambulatory Visit: Payer: Self-pay

## 2022-06-02 DIAGNOSIS — R29818 Other symptoms and signs involving the nervous system: Secondary | ICD-10-CM | POA: Diagnosis not present

## 2022-06-02 DIAGNOSIS — Z79899 Other long term (current) drug therapy: Secondary | ICD-10-CM | POA: Insufficient documentation

## 2022-06-02 DIAGNOSIS — Z20822 Contact with and (suspected) exposure to covid-19: Secondary | ICD-10-CM | POA: Diagnosis not present

## 2022-06-02 DIAGNOSIS — R531 Weakness: Secondary | ICD-10-CM | POA: Diagnosis not present

## 2022-06-02 DIAGNOSIS — Z7902 Long term (current) use of antithrombotics/antiplatelets: Secondary | ICD-10-CM | POA: Insufficient documentation

## 2022-06-02 DIAGNOSIS — I129 Hypertensive chronic kidney disease with stage 1 through stage 4 chronic kidney disease, or unspecified chronic kidney disease: Secondary | ICD-10-CM | POA: Insufficient documentation

## 2022-06-02 DIAGNOSIS — I10 Essential (primary) hypertension: Secondary | ICD-10-CM | POA: Diagnosis not present

## 2022-06-02 DIAGNOSIS — R202 Paresthesia of skin: Secondary | ICD-10-CM | POA: Diagnosis not present

## 2022-06-02 DIAGNOSIS — R200 Anesthesia of skin: Secondary | ICD-10-CM | POA: Diagnosis not present

## 2022-06-02 DIAGNOSIS — N189 Chronic kidney disease, unspecified: Secondary | ICD-10-CM | POA: Insufficient documentation

## 2022-06-02 LAB — CBC
HCT: 38.5 % — ABNORMAL LOW (ref 39.0–52.0)
Hemoglobin: 13 g/dL (ref 13.0–17.0)
MCH: 32.4 pg (ref 26.0–34.0)
MCHC: 33.8 g/dL (ref 30.0–36.0)
MCV: 96 fL (ref 80.0–100.0)
Platelets: 207 10*3/uL (ref 150–400)
RBC: 4.01 MIL/uL — ABNORMAL LOW (ref 4.22–5.81)
RDW: 12.1 % (ref 11.5–15.5)
WBC: 5.8 10*3/uL (ref 4.0–10.5)
nRBC: 0 % (ref 0.0–0.2)

## 2022-06-02 LAB — URINALYSIS, ROUTINE W REFLEX MICROSCOPIC
Bilirubin Urine: NEGATIVE
Glucose, UA: >1000 mg/dL — AB
Hgb urine dipstick: NEGATIVE
Ketones, ur: NEGATIVE mg/dL
Leukocytes,Ua: NEGATIVE
Nitrite: NEGATIVE
Protein, ur: NEGATIVE mg/dL
Specific Gravity, Urine: 1.006 (ref 1.005–1.030)
pH: 7 (ref 5.0–8.0)

## 2022-06-02 LAB — DIFFERENTIAL
Abs Immature Granulocytes: 0.01 10*3/uL (ref 0.00–0.07)
Basophils Absolute: 0 10*3/uL (ref 0.0–0.1)
Basophils Relative: 1 %
Eosinophils Absolute: 0.2 10*3/uL (ref 0.0–0.5)
Eosinophils Relative: 3 %
Immature Granulocytes: 0 %
Lymphocytes Relative: 24 %
Lymphs Abs: 1.4 10*3/uL (ref 0.7–4.0)
Monocytes Absolute: 0.6 10*3/uL (ref 0.1–1.0)
Monocytes Relative: 10 %
Neutro Abs: 3.6 10*3/uL (ref 1.7–7.7)
Neutrophils Relative %: 62 %

## 2022-06-02 LAB — RAPID URINE DRUG SCREEN, HOSP PERFORMED
Amphetamines: NOT DETECTED
Barbiturates: NOT DETECTED
Benzodiazepines: NOT DETECTED
Cocaine: NOT DETECTED
Opiates: NOT DETECTED
Tetrahydrocannabinol: NOT DETECTED

## 2022-06-02 LAB — COMPREHENSIVE METABOLIC PANEL
ALT: 13 U/L (ref 0–44)
AST: 15 U/L (ref 15–41)
Albumin: 4.4 g/dL (ref 3.5–5.0)
Alkaline Phosphatase: 53 U/L (ref 38–126)
Anion gap: 8 (ref 5–15)
BUN: 34 mg/dL — ABNORMAL HIGH (ref 8–23)
CO2: 26 mmol/L (ref 22–32)
Calcium: 9.8 mg/dL (ref 8.9–10.3)
Chloride: 105 mmol/L (ref 98–111)
Creatinine, Ser: 1.73 mg/dL — ABNORMAL HIGH (ref 0.61–1.24)
GFR, Estimated: 44 mL/min — ABNORMAL LOW (ref >60)
Glucose, Bld: 98 mg/dL (ref 70–99)
Potassium: 4.4 mmol/L (ref 3.5–5.1)
Sodium: 139 mmol/L (ref 135–145)
Total Bilirubin: 0.3 mg/dL (ref 0.3–1.2)
Total Protein: 6.9 g/dL (ref 6.5–8.1)

## 2022-06-02 LAB — ETHANOL: Alcohol, Ethyl (B): <10 mg/dL (ref <10)

## 2022-06-02 LAB — RESP PANEL BY RT-PCR (FLU A&B, COVID) ARPGX2
Influenza A by PCR: NEGATIVE
Influenza B by PCR: NEGATIVE
SARS Coronavirus 2 by RT PCR: NEGATIVE

## 2022-06-02 LAB — APTT: aPTT: 28 seconds (ref 24–36)

## 2022-06-02 LAB — PROTIME-INR
INR: 1 (ref 0.8–1.2)
Prothrombin Time: 12.9 seconds (ref 11.4–15.2)

## 2022-06-02 LAB — CBG MONITORING, ED: Glucose-Capillary: 118 mg/dL — ABNORMAL HIGH (ref 70–99)

## 2022-06-02 MED ORDER — TOPIRAMATE 25 MG PO TABS
25.0000 mg | ORAL_TABLET | Freq: Once | ORAL | Status: AC
  Administered 2022-06-02: 25 mg via ORAL
  Filled 2022-06-02: qty 1

## 2022-06-02 NOTE — ED Notes (Signed)
Patient transported to CT with nursing

## 2022-06-02 NOTE — ED Notes (Signed)
PT arrived by [facility], report received

## 2022-06-02 NOTE — ED Notes (Signed)
Pt back from MRI 

## 2022-06-02 NOTE — Discharge Instructions (Addendum)
Your MRI was negative for acute stroke. Please take the Topamax medication as prescribed and follow-up with your neurologist.

## 2022-06-02 NOTE — Consult Note (Signed)
[facility] Telemedicine Consult   Requesting Provider: Michiel [name] Consult Participants: patient, [facility] RN Edwin [name] and bedsie RNnGina Location of the provider: Home office Location of the patient: [facility] Teleneurologist sign on 1700 hrs  This consult was provided via telemedicine with 2-way video and audio communication. The patient/family was informed that care would be provided in this way and agreed to receive care in this manner.    Chief Complaint: Left-sided tingling   HPI: [name] is a 66 year old old Caucasian male with past medical history of diabetes, hypertension, hypogonadism, bipolar disorder, gout and obesity who was brought in by private vehicle today for sudden onset of increased left-sided tingling and some dizziness and gait imbalance at 1500 hrs.  Patient does have residual left-sided paresthesias from a previous brainstem stroke in March 2023 which was felt to be lacunar in nature.  He has been on Plavix since then and states he has been quite compliant with his medications.  He states he was fine and all of a sudden he noticed increased tingling on the left side today.  He denies any slurred speech, headache, double vision, extremity weakness or numbness.    LKW: 1500 hrs. tpa given?: No, deficits to mild to treat IR Thrombectomy? No, clinical presentation not consistent with LVO Modified Rankin Scale: 1-No significant post stroke disability and can perform usual duties with stroke symptoms Time of teleneurologist evaluation: 1700 hours  Exam: Vitals:   06/02/22 1640  BP: 127/87  Pulse: 72  Resp: 20  Temp: (!) 97 F (36.1 C)  SpO2: 100%    General:  Obese middle-aged Caucasian male not in distress. . Afebrile. Head is nontraumatic. Neck is supple without bruit.    Cardiac exam no murmur or gallop. Lungs are clear to auscultation. Distal pulses are well felt.  Neurological Exam ;  Awake  Alert oriented x 3. Normal speech and language.eye  movements full without nystagmus.fundi were not visualized. Vision acuity and fields appear normal. Hearing is normal. Palatal movements are normal. Face symmetric. Tongue midline. Normal strength, tone, reflexes and coordination. Normal sensation but subjective paresthesias on the left hemibody. Gait deferred.  1A: Level of Consciousness -0 1B: Ask Month and Age -21 1C: 'Blink Eyes' & 'Squeeze Hands' -0 2: Test Horizontal Extraocular Movements -0 3: Test Visual Fields -0 4: Test Facial Palsy -0 5A: Test Left Arm Motor Drift -0 5B: Test Right Arm Motor Drift -0 6A: Test Left Leg Motor Drift -0 6B: Test Right Leg Motor Drift -0 7: Test Limb Ataxia -0 8: Test Sensation -0 9: Test Language/Aphasia-0 10: Test Dysarthria -0 11: Test Extinction/Inattention -0  NIHSS score: 0.  No sensory loss but positive for subjective paresthesias on the left   Imaging Reviewed: CT head without contrast Shows no acute abnormality.  Aspects score is 10 Labs reviewed in epic and pertinent values follow: Comprehensive metabolic panel Labs are significant only for creatinine of 1.73 and low GFR of 44.  CBC is unremarkable.  PT, PTT and INR are normal.  Ethanol level is not detectable.   Assessment: 66 year old Caucasian male with mild residual left-sided paresthesias from previous brainstem stroke in March 2023 due to small vessel disease presents with sudden worsening of subjective left body paresthesias without any other focal accompanying symptoms. Differential includes worsening of old deficits versus small new right thalamic or pontine lacunar infarct likely from small vessel disease.  Recommendations:  Check MRI scan of the brain and if positive for stroke admit and transfer to [facility] for further evaluation and treatment.  Since patient had a complete stroke work-up in March 2023 will not order further work-up at the present time unless acute stroke is confirmed.  If negative for stroke recommend  Topamax 25 mg twice daily for his paresthesias.  Continue Plavix for stroke prevention as patient is allergic to aspirin.  Maintain aggressive risk factor modification with strict control of hypertension blood pressure goal below 130/90 in the long-term and not acutely and diabetes with hemoglobin A1c goal below 6.5% and cholesterol with LDL goal below 70 mg percent.  Also consider outpatient evaluation for sleep apnea if not already done.  Follow-up as an outpatient in the stroke clinic in 2 months.  Long discussion with patient and answered questions to his satisfaction and they voiced understanding.  Discussed with Dr. Lockie [name].  This patient is receiving care for possible acute neurological changes. There was 55 minutes of care by this provider at the time of service, including time for direct evaluation via telemedicine, review of medical records, imaging studies and discussion of findings with providers, the patient and/or family.  Edwin Heady, MD [facility] [phone]  If 7pm- 7am, please page neurology on call as listed in AMION.

## 2022-06-02 NOTE — ED Notes (Signed)
Patient unable to provide urine sample to this time. Notified patient that sample is needed when able.

## 2022-06-02 NOTE — ED Provider Notes (Signed)
10:42 PM patient transferred from [facility] for paresthesias, h/o stroke. MRI performed and reviewed. This does not show any signs of any stroke.  Patient was evaluated earlier by telemetry neurology.  Recommendation for Topamax twice daily if MRI negative.  Patient be initiated on this therapy.  He has appropriate neurology follow-up.  Patient counseled to return if they have weakness in their arms or legs, slurred speech, trouble walking or talking, confusion, trouble with their balance, or if they have any other concerns. Patient verbalizes understanding and agrees with plan.   BP 126/79 (BP Location: Right Arm)   Pulse 63   Temp 97.9 F (36.6 C) (Oral)   Resp 16   SpO2 100%     Renne Crigler, PA-C 06/02/22 2243    Tegeler, Canary Brim, MD 06/02/22 [phone]

## 2022-06-02 NOTE — Progress Notes (Addendum)
Code Stroke activated @ 1646.  Pt to CT @ 1650, returned @ 1703.  mRS 1.  NIHSS 0.  Dr. Pearlean Brownie on camera @ 1712.  No TNK.

## 2022-06-02 NOTE — ED Provider Notes (Signed)
[facility] EMERGENCY DEPT Provider Note   CSN: [id number] Arrival date & time: 06/02/22  1626  An emergency department physician performed an initial assessment on this suspected stroke patient at 1719.  History  Chief Complaint  Patient presents with   Numbness    Edwin [name] is a 66 y.o. male.  Patient here with left arm and leg numbness that started at 2 PM.  History of stroke last year on Plavix.  States he had residual left-sided numbness in his arm and leg from that stroke but acutely got worse at 2 PM.  Has a history of hypertension, CKD.  He noticed some gait imbalance as well that started last night.  No weakness, no vision changes or speech changes or facial droop.  Nothing has made it worse or better.  The history is provided by the patient.       Home Medications Prior to Admission medications   Medication Sig Start Date End Date Taking? Authorizing Provider  Cholecalciferol 1000 units TBDP Take 1 tablet by mouth daily.     [provider]  clopidogrel (PLAVIX) 75 MG tablet Take 1 tablet (75 mg total) by mouth daily. 02/06/22   Ghimire, Henreitta Leber, MD  dapagliflozin propanediol (FARXIGA) 10 MG TABS tablet Take 10 mg by mouth daily.    [provider]  diclofenac Sodium (VOLTAREN) 1 % GEL Apply 2 g topically daily as needed (for knee pain).    [provider]  divalproex (DEPAKOTE ER) 500 MG 24 hr tablet Take 2 tablets (1,000 mg total) by mouth at bedtime. 05/01/22   Cottle, Billey Co., MD  DULoxetine (CYMBALTA) 60 MG capsule Take 2 capsules (120 mg total) by mouth daily. 05/01/22   Cottle, Billey Co., MD  febuxostat (ULORIC) 40 MG tablet Take 40 mg by mouth daily.    [provider]  furosemide (LASIX) 20 MG tablet Take 20 mg by mouth daily as needed for edema. May take 2 to 3 days weekly 07/21/19   [provider]  hydrALAZINE (APRESOLINE) 50 MG tablet Take 1 tablet (50 mg total) by mouth 3 (three) times  daily. 02/06/22   Ghimire, Henreitta Leber, MD  lamoTRIgine (LAMICTAL) 200 MG tablet Take 1 tablet (200 mg total) by mouth every morning. 05/01/22   Cottle, Billey Co., MD  LevOCARNitine (L-CARNITINE) 500 MG TABS Take 500 mg by mouth 2 (two) times daily.    [provider]  losartan (COZAAR) 50 MG tablet Take 2 tablets (100 mg total) by mouth daily. 02/06/22   Ghimire, Henreitta Leber, MD  metFORMIN (GLUCOPHAGE-XR) 500 MG 24 hr tablet Take 500 mg by mouth 2 (two) times daily. 01/07/22   [provider]  multivitamin-lutein (OCUVITE-LUTEIN) CAPS capsule Take 1 capsule by mouth daily.    [provider]  pramipexole (MIRAPEX) 0.5 MG tablet Take 1 tablet (0.5 mg total) by mouth 2 (two) times daily. 05/01/22   Cottle, Billey Co., MD      Allergies    Allopurinol, Penicillins, Finerenone, Penicillin g, Aspirin, and Ciprofloxacin    Review of Systems   Review of Systems  Physical Exam Updated Vital Signs BP 127/87 (BP Location: Right Arm)   Pulse 72   Temp (!) 97 F (36.1 C) (Oral)   Resp 20   SpO2 100%  Physical Exam Vitals and nursing note reviewed.  Constitutional:      General: He is not in acute distress.    Appearance: He is well-developed.  HENT:     Head: Normocephalic and atraumatic.     Nose: Nose normal.     Mouth/Throat:     Mouth: Mucous membranes are moist.  Eyes:     Extraocular Movements: Extraocular movements intact.     Conjunctiva/sclera: Conjunctivae normal.     Pupils: Pupils are equal, round, and reactive to light.  Cardiovascular:     Rate and Rhythm: Normal rate and regular rhythm.     Heart sounds: No murmur heard. Pulmonary:     Effort: Pulmonary effort is normal. No respiratory distress.     Breath sounds: Normal breath sounds.  Abdominal:     Palpations: Abdomen is soft.     Tenderness: There is no abdominal tenderness.  Musculoskeletal:        General: No swelling.     Cervical back: Neck supple.  Skin:    General: Skin is warm and  dry.     Capillary Refill: Capillary refill takes less than 2 seconds.  Neurological:     General: No focal deficit present.     Mental Status: He is alert and oriented to person, place, and time.     Sensory: Sensory deficit present.     Comments: Little off balance when he stands, decree sensation in the left arm and leg compared to the right, no facial droop, normal finger-nose-finger, normal visual fields, normal speech, 5+ out of 5 strength throughout, no drift  Psychiatric:        Mood and Affect: Mood normal.     ED Results / Procedures / Treatments   Labs (all labs ordered are listed, but only abnormal results are displayed) Labs Reviewed  CBC - Abnormal; Notable for the following components:      Result Value   RBC 4.01 (*)    HCT 38.5 (*)    All other components within normal limits  COMPREHENSIVE METABOLIC PANEL - Abnormal; Notable for the following components:   BUN 34 (*)    Creatinine, Ser 1.73 (*)    GFR, Estimated 44 (*)    All other components within normal limits  CBG MONITORING, ED - Abnormal; Notable for the following components:   Glucose-Capillary 118 (*)    All other components within normal limits  RESP PANEL BY RT-PCR (FLU A&B, COVID) ARPGX2  ETHANOL  PROTIME-INR  APTT  DIFFERENTIAL  RAPID URINE DRUG SCREEN, HOSP PERFORMED  URINALYSIS, ROUTINE W REFLEX MICROSCOPIC    EKG None  Radiology CT HEAD CODE STROKE WO CONTRAST  Result Date: 06/02/2022 CLINICAL DATA:  Code stroke. Left-sided numbness and weakness beginning at 2 p.m. today. Balance issues yesterday. EXAM: CT HEAD WITHOUT CONTRAST TECHNIQUE: Contiguous axial images were obtained from the base of the skull through the vertex without intravenous contrast. RADIATION DOSE REDUCTION: This exam was performed according to the departmental dose-optimization program which includes automated exposure control, adjustment of the mA and/or kV according to patient size and/or use of iterative reconstruction  technique. COMPARISON:  CT head without contrast and MR head without contrast 02/04/2022 FINDINGS: Brain: Brainstem infarct identified on previous MRI is below the resolution of CT. No acute infarct, hemorrhage, or mass lesion is present. Basal ganglia are within normal limits. Insular ribbon is normal bilaterally. No acute or focal cortical abnormality is present. No significant white matter lesions are present. Cerebellum is unremarkable. The ventricles are of normal size. No significant extraaxial fluid collection is present. Vascular: No hyperdense vessel or unexpected calcification. Skull: Calvarium is intact. No focal lytic  or blastic lesions are present. No significant extracranial soft tissue lesion is present. Sinuses/Orbits: The paranasal sinuses and mastoid air cells are clear. Bilateral lens replacements are noted. Globes and orbits are otherwise unremarkable. ASPECTS [facility] Stroke Program Early CT Score) - Ganglionic level infarction (caudate, lentiform nuclei, internal capsule, insula, M1-M3 cortex): 7/7 - Supraganglionic infarction (M4-M6 cortex): 3/3 Total score (0-10 with 10 being normal): 10/10 IMPRESSION: 1. No acute intracranial abnormality or significant interval change. 2. ASPECTS is 10/10. These results were called by telephone at the time of interpretation on 06/02/2022 at 5:15 pm to provider Tashauna Caisse , who verbally acknowledged these results. Electronically Signed   By: Marin Roberts M.D.   On: 06/02/2022 17:19    Procedures Procedures    Medications Ordered in ED Medications - No data to display  ED Course/ Medical Decision Making/ A&P                           Medical Decision Making Amount and/or Complexity of Data Reviewed Labs: ordered. Radiology: ordered.   [name] is here with left-sided numbness that started 2 hours ago.  History of stroke.  Left-sided numbness as residual deficit.  Having some balance issues since last night as well.  Code  stroke initiated upon arrival.  Lab work and head CT obtained.  Neurology consultation done.  No tPA given as only subjective numbness in the left side.  CT scan of the head as discussed on the phone with radiology shows no acute findings.  Blood work per my review and interpretation with no acute findings.  Dr. Theophilus Kinds with neurology recommends transfer to [facility] for MRI.  If MRI is negative can be sent home with Topamax 25 mg twice daily.  And can follow-up with him outpatient.  If MRI is positive please consult neurology.  EKG per my review and interpretation shows sinus rhythm.  No ischemic changes.  This chart was dictated using voice recognition software.  Despite best efforts to proofread,  errors can occur which can change the documentation meaning.         Final Clinical Impression(s) / ED Diagnoses Final diagnoses:  Numbness    Rx / DC Orders ED Discharge Orders     None         Virgina Norfolk, DO 06/02/22 1738

## 2022-06-02 NOTE — ED Triage Notes (Signed)
Pt had stroke last year. Pt noted yesterday that his balance was off around 8 pm. He has some residual tingling to left arm and left leg that he has been using a nerve stimulator . Today at 2pm he noticed an increase in the tingling and left arm feels numb when I touch during extinction.

## 2022-06-03 ENCOUNTER — Telehealth (HOSPITAL_BASED_OUTPATIENT_CLINIC_OR_DEPARTMENT_OTHER): Payer: Self-pay | Admitting: Emergency Medicine

## 2022-06-03 MED ORDER — TOPIRAMATE 25 MG PO TABS: 25.0000 mg | ORAL_TABLET | Freq: Two times a day (BID) | ORAL | 0 refills | Status: DC

## 2022-06-03 NOTE — Telephone Encounter (Signed)
Received call regarding topiramate rx. Initiated dose as neurology had recommended. Pt to follow up with outpatient neurologist.

## 2022-06-04 ENCOUNTER — Telehealth: Payer: Self-pay | Admitting: Adult Health

## 2022-06-04 NOTE — Telephone Encounter (Signed)
Pt is calling and said he was seen at the ER and

## 2022-06-06 ENCOUNTER — Other Ambulatory Visit: Payer: Self-pay | Admitting: *Deleted

## 2022-06-06 MED ORDER — CLOPIDOGREL BISULFATE 75 MG PO TABS: 75.0000 mg | ORAL_TABLET | Freq: Every day | ORAL | 0 refills | Status: DC

## 2022-06-06 NOTE — Telephone Encounter (Signed)
Pt gets blister packs for his meds.  Needing prescription for plavix.

## 2022-06-11 ENCOUNTER — Telehealth: Payer: Self-pay

## 2022-06-11 DIAGNOSIS — G4733 Obstructive sleep apnea (adult) (pediatric): Secondary | ICD-10-CM

## 2022-06-11 DIAGNOSIS — G4734 Idiopathic sleep related nonobstructive alveolar hypoventilation: Secondary | ICD-10-CM

## 2022-06-11 DIAGNOSIS — I6302 Cerebral infarction due to thrombosis of basilar artery: Secondary | ICD-10-CM

## 2022-06-11 NOTE — Telephone Encounter (Signed)
Called patient to schedule in lab sleep study. Pt refused to schedule inlab due to his work schedule. Pt states he works from 12p to 9pm and then goes to bed a 3am.   Pt is requesting a HST. Please advise on how you would like to proceed. Thanks!

## 2022-06-11 NOTE — Addendum Note (Signed)
Addended by: Arther Abbott on: 06/11/2022 11:39 AM   Modules accepted: Orders

## 2022-06-12 NOTE — Telephone Encounter (Signed)
[employer] [place]: [id number] (exp. 06/07/22 to 08/05/22)   [place] Numbers: [id number] (exp. 06/12/22 to 08/10/22)

## 2022-06-12 NOTE — Telephone Encounter (Signed)
LVM for pt to call back to schedule [employer] auth: [id number] (exp. 06/12/22 to 08/10/22)

## 2022-06-15 ENCOUNTER — Encounter: Payer: Self-pay | Admitting: Adult Health

## 2022-06-15 DIAGNOSIS — E119 Type 2 diabetes mellitus without complications: Secondary | ICD-10-CM | POA: Diagnosis not present

## 2022-06-16 DIAGNOSIS — G4734 Idiopathic sleep related nonobstructive alveolar hypoventilation: Secondary | ICD-10-CM | POA: Diagnosis not present

## 2022-06-18 ENCOUNTER — Telehealth: Payer: Self-pay | Admitting: *Deleted

## 2022-06-18 NOTE — Telephone Encounter (Signed)
Patient returned my call. He is scheduled at [facility] for 07/03/22 at 11:30 AM.  HST- [employer] Numbers: [id number] (exp. 06/12/22 to 08/10/22).  I mailed packet to the patient.

## 2022-06-18 NOTE — Telephone Encounter (Signed)
Looking at the ED note it does look like they prescribed Topamax for paresthesias.  Has he tried it?  If not he may want to give it a try and see if it is helpful if not we can get him in the office to try another medication

## 2022-06-18 NOTE — Chronic Care Management (AMB) (Signed)
  Care Coordination   Note   06/18/2022 Name: Edwin [name] MRN: [id number] DOB: Apr 05, 1956  [name] is a 66 y.o. year old male who sees Marden Noble, MD for primary care. I reached out to [name] by phone today to offer care coordination services.  [name] was given information about Care Coordination services today including:   The Care Coordination services include support from the care team which includes your Nurse Coordinator, Clinical Social Worker, or Pharmacist.  The Care Coordination team is here to help remove barriers to the health concerns and goals most important to you. Care Coordination services are voluntary, and the patient may decline or stop services at any time by request to their care team member.   Care Coordination Consent Status: Patient agreed to services and verbal consent obtained.   Follow up plan:  Telephone appointment with care coordination team member scheduled for:  06/19/2022  Encounter Outcome:  Pt. Scheduled  Burman Nieves, CCMA Care Coordination Care Guide Direct Dial: [phone]

## 2022-06-19 ENCOUNTER — Ambulatory Visit: Payer: Self-pay

## 2022-06-19 ENCOUNTER — Telehealth: Payer: Self-pay

## 2022-06-19 NOTE — Patient Instructions (Signed)
Thank you for allowing the Care Management team to participate in your care. It was great speaking with you today. Congratulations!!! You have made several notable improvements with your health. Your motivation is amazing! Please do not hesitate to call if you require assistance.  Following are the goals we discussed today:   Goals Addressed             This Visit's Progress    Health Maintenance       Care Coordination Interventions: Reviewed medications and plan for disease management.  Reports excellent compliance with medications and treatment plans. Discussed latest labs. Cholesterol and lipids are within range.  Successfully lowered A1C to 5.6%.  Discussed weight management goals. Successfully lowered weight from 270 lbs to 215 lbs. Reports weight maintenance goal is 195 lbs. Discussed plan for nutrition and activity. Reports doing very well with nutritional intake. Engages with a dietician approximately every six weeks. Engages in exercise/activity 30 minutes a day/5 days a week. Doing well and very motivated to optimize overall health. Agreed to call for care coordination concerns as needed.        Edwin [name] verbalized understanding of the information discussed during the telephonic outreach today.  Declined need for mailed/printed instructions.  Edwin [name] will call for care coordination as needed.  [facility] Care Management [phone]

## 2022-06-19 NOTE — Patient Outreach (Signed)
  Care Management   Outreach Note  06/19/2022 Name: Edwin [name] MRN: [id number] DOB: [birth year]  An unsuccessful telephone outreach was attempted today. The patient was referred to the case management team for assistance with care management and care coordination.   Follow Up Plan:  A HIPAA compliant voice message was left today requesting a return call.  [facility]  Care Management [phone]

## 2022-06-19 NOTE — Patient Outreach (Signed)
  Care Coordination   Initial Visit Note   06/19/2022 Name: BRAIDEN RODMAN MRN: [id number] DOB: 1956-04-10  [name] is a 66 y.o. year old male who sees Marden Noble, MD for primary care. I spoke with  [name] by phone today  What matters to the patients health and wellness today?  Health Maintenance   Goals Addressed             This Visit's Progress    Health Maintenance       Care Coordination Interventions: Reviewed medications and plan for disease management.  Reports excellent compliance with medications and treatment plans. Discussed latest labs. Cholesterol and lipids within range.  Successfully lowered A1C to 5.6%.  Discussed weight management goals. Successfully lowered weight from 270 lbs to 215 lbs. Reports weight maintenance goal is 195 lbs. Discussed plan for nutrition and activity. Reports doing very well with nutritional intake. Engages with a dietician approximately every six weeks. Engages in exercise/activity 30 minutes a day/5 days a week. Doing well and very motivated to optimize overall health. Agreed to call for care coordination concerns as needed.        SDOH assessments and interventions completed:   Yes  SDOH Interventions    Flowsheet Row Most Recent Value  SDOH Interventions   Food Insecurity Interventions Intervention Not Indicated  Transportation Interventions Intervention Not Indicated        Care Coordination Interventions Activated:  Yes Care Coordination Interventions:  Yes, provided  Follow up plan:  Mr. Lundberg will contact the team for care coordination as needed.  Encounter Outcome:  Pt. Visit Completed  [facility] Care Management [phone]

## 2022-06-20 ENCOUNTER — Ambulatory Visit (INDEPENDENT_AMBULATORY_CARE_PROVIDER_SITE_OTHER): Payer: BC Managed Care – PPO | Admitting: Adult Health

## 2022-06-20 ENCOUNTER — Encounter: Payer: Self-pay | Admitting: Adult Health

## 2022-06-20 VITALS — BP 127/79 | HR 64 | Ht 70.0 in | Wt 218.0 lb

## 2022-06-20 DIAGNOSIS — I69398 Other sequelae of cerebral infarction: Secondary | ICD-10-CM | POA: Diagnosis not present

## 2022-06-20 DIAGNOSIS — R269 Unspecified abnormalities of gait and mobility: Secondary | ICD-10-CM | POA: Diagnosis not present

## 2022-06-20 DIAGNOSIS — R202 Paresthesia of skin: Secondary | ICD-10-CM | POA: Diagnosis not present

## 2022-06-20 NOTE — Progress Notes (Signed)
PATIENT: Edwin [name] DOB: Mar 15, 1956  REASON FOR VISIT: follow up HISTORY FROM: patient PRIMARY NEUROLOGIST: Dr. Pearlean Brownie  Chief Complaint  Patient presents with   Follow-up    Rm 19 alone here for f/u on Topamax unable to tolerate due to side effects. Would like to discuss next options.     HISTORY OF PRESENT ILLNESS: Today 06/20/22: Edwin [name] is a 66 year old male with a history of stroke with residual paresthesia.  He returns today for follow-up. Patient Edwin [name] to the ED on July 15 with left arm and leg numbness.  The patient had residual numbness in the left arm and leg from his prior stroke but felt that it is gotten worse.  Work-up in the ED was relatively unremarkable.  He was started on Topamax for paresthesias but feels that this made it worse.  He returns today to discuss another medication to try. He describes it has pins and needles sensation in the left arm and leg- not as much numbness. He teaches piano.   Reports that he does feel that is balance is off. Tends to veer to the left when ambulating. Feels like he is going to fall but fortunately has not had any falls.  Reports that when he was completing physical therapy he did find it beneficial.  Would like to have another round of physical therapy.  03/22/22: Edwin [name] is a 66 year old male who presented to the ED on March 19 with left-sided sensory deficit and difficulty with ambulation.  He did not receive TNKase due to being out of the window.  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 CT head - 02/04/2022 - No acute intracranial process.  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  Continue Plavix on discharge  Patient reports that he is doing well. Continues to have some tingling on the left side of the body.  Feels it down the arm and leg. Did not complete any therapy. Reports that he had blood work through PCP and reports that LDL is better and is no longer Crestor. States that it caused fatigue and muscle pain.   Reports that he is monitoring diet and sees a nutritionist  [facility]A: was using it but then went on a trip and the machine adaptor was broken and was not able to get a replacement CPAP. Was seeing Dr. Earl Gala.  States that he struggled using the CPAP.  Reports that he would wake up every hour.  Reports that he tried different mask with no benefit.  Reports that he is interested in inspire but Dr. Earl Gala does not do this.  He has not seen Dr. Earl Gala in the last year.  He feels that his last sleep study was in 2021 he is requesting to see our sleep physicians here to see if he is a candidate for possible inspire device   REVIEW OF SYSTEMS: Out of a complete 14 system review of symptoms, the patient complains only of the following symptoms, and all other reviewed systems are negative.  ALLERGIES:  Allergies  Allergen Reactions   Allopurinol Other (See Comments)    Made pt emotionally unstable  Other reaction(s): emotionally labile   Penicillins Hives and Other (See Comments)    Has patient had a PCN reaction causing immediate rash, facial/tongue/throat swelling, SOB or lightheadedness with hypotension: No Has patient had a PCN reaction causing severe rash involving mucus membranes or skin necrosis: No Has patient had a PCN reaction that required hospitalization No Has patient had a PCN reaction occurring within the last 10 years: No If all of the above answers are "NO", then may proceed with Cephalosporin use.   Finerenone Other (See Comments)     hyperkalemia Other reaction(s): Increases  potassium levels   Penicillin G     Other reaction(s): hives   Aspirin Hives   Ciprofloxacin Rash    Other reaction(s): rash    HOME MEDICATIONS: Outpatient Medications Prior to Visit  Medication Sig Dispense Refill   Cholecalciferol 1000 units TBDP Take 1 tablet by mouth daily.      clopidogrel (PLAVIX) 75 MG tablet Take 1 tablet (75 mg total) by mouth daily. 90 tablet 0   dapagliflozin propanediol (FARXIGA) 10 MG TABS tablet Take 10 mg by mouth daily.     diclofenac Sodium (VOLTAREN) 1 % GEL Apply 2 g topically daily as needed (for knee pain).     divalproex (DEPAKOTE ER) 500 MG 24 hr tablet Take 2 tablets (1,000 mg total) by mouth at bedtime. 180 tablet 1   DULoxetine (CYMBALTA) 60 MG capsule Take 2 capsules (120 mg total) by mouth daily. 180 capsule 1   febuxostat (ULORIC) 40 MG tablet Take 40 mg by mouth daily.     furosemide (LASIX) 20 MG tablet Take 20 mg by mouth daily as needed for edema. May take 2 to 3 days weekly     hydrALAZINE (APRESOLINE) 50 MG tablet Take 1 tablet (50 mg total) by mouth 3 (three) times daily.     lamoTRIgine (LAMICTAL) 200 MG tablet Take 1 tablet (200 mg total) by mouth every morning. 90 tablet 1   LevOCARNitine (L-CARNITINE) 500 MG TABS Take 500 mg by mouth 2 (two) times daily.     losartan (COZAAR) 50 MG tablet Take 2 tablets (100 mg total) by mouth daily.     metFORMIN (GLUCOPHAGE-XR) 500 MG 24 hr tablet Take 500 mg by mouth 2 (two) times daily.     multivitamin-lutein (OCUVITE-LUTEIN) CAPS capsule Take 1 capsule by mouth daily.     pramipexole (MIRAPEX) 0.5 MG tablet Take 1 tablet (0.5 mg total) by mouth 2 (two) times daily. 180 tablet 1   topiramate (TOPAMAX) 25 MG tablet Take 1 tablet (25 mg total) by mouth 2 (two) times daily. 60 tablet 0   No facility-administered medications prior to visit.    PAST MEDICAL HISTORY: Past Medical History:  Diagnosis Date   Allergic rhinitis    Bipolar disorder ([place])     Bradycardia 2012    due to lithium   CKD (chronic kidney disease) stage 2, GFR 60-89 ml/min    Gout    Hypertension    Mild renal insufficiency    , with creatinine of 1.3 in 2010, was side effect of med   Obesity    ,moderate   Prostatitis    , Episodic   Suicide attempt ([place]) 09/20/2014    PAST SURGICAL HISTORY: Past Surgical History:  Procedure Laterality Date   APPENDECTOMY     CATARACT EXTRACTION Right    COLONOSCOPY WITH  PROPOFOL N/A 06/14/2014   Procedure: COLONOSCOPY WITH PROPOFOL;  Surgeon: Garlan Fair, MD;  Location: [facility];  Service: Endoscopy;  Laterality: N/A;   TONSILLECTOMY     UMBILICAL HERNIA REPAIR      FAMILY HISTORY: Family History  Problem Relation Age of Onset   Other Mother        Viral Meningitis   Mitral valve prolapse Mother    Arrhythmia Mother    Hypothyroidism Mother    Stroke Mother    Hypertension Father    Gout Father    Alzheimer's disease Father     SOCIAL HISTORY: Social History   Socioeconomic History   Marital status: Married    Spouse name: Sweet   Number of children: 0   Years of education: Not on file   Highest education level: Master's degree (e.g., MA, MS, MEng, MEd, MSW, MBA)  Occupational History   Not on file  Tobacco Use   Smoking status: Never   Smokeless tobacco: Never  Substance and Sexual Activity   Alcohol use: No   Drug use: No   Sexual activity: Not on file  Other Topics Concern   Not on file  Social History Narrative   Lives alone   Left handed   Caffeine: 2 ice tea a day   Social Determinants of Health   Financial Resource Strain: Low Risk  (02/22/2022)   Overall Financial Resource Strain (CARDIA)    Difficulty of Paying Living Expenses: Not hard at all  Food Insecurity: No Food Insecurity (06/19/2022)   Hunger Vital Sign    Worried About Running Out of Food in the Last Year: Never true    Ran Out of Food in the Last Year: Never true  Transportation Needs: No Transportation Needs  (06/19/2022)   PRAPARE - Hydrologist (Medical): No    Lack of Transportation (Non-Medical): No  Physical Activity: Not on file  Stress: No Stress Concern Present (02/22/2022)   [place]    Feeling of Stress : Only a little  Social Connections: Not on file  Intimate Partner Violence: Not At Risk (02/22/2022)   Humiliation, Afraid, Rape, and Kick questionnaire    Fear of Current or Ex-Partner: No    Emotionally Abused: No    Physically Abused: No    Sexually Abused: No      PHYSICAL EXAM  Vitals:   06/20/22 0948  BP: 127/79  Pulse: 64  Weight: 218 lb (98.9 kg)  Height: 5\' 10"  (1.778 m)    Body mass index is 31.28 kg/m.  Generalized: Well developed, in no acute distress   Neurological examination  Mentation: Alert oriented to time, place, history taking. Follows all commands speech and language fluent Cranial nerve II-XII: Pupils were equal round reactive to light. Extraocular movements were full, visual field were full on confrontational test. Facial sensation and strength were normal.  Head turning and shoulder shrug  were normal and symmetric. Motor: The motor testing reveals 5 over 5 strength of all 4 extremities. Good symmetric motor tone is noted throughout.  Sensory: Sensory testing is intact to soft touch on all 4 extremities. No evidence of extinction is noted.  Coordination: Cerebellar testing reveals good finger-nose-finger and heel-to-shin bilaterally.  Gait and station: Gait is slow and cautious.  Tandem gait not attempted.  Multiple steps with turns.   DIAGNOSTIC DATA (LABS, IMAGING, TESTING) - I reviewed patient records, labs, notes, testing and imaging  myself where available.  Lab Results  Component Value Date   WBC 5.8 06/02/2022   HGB 13.0 06/02/2022   HCT 38.5 (L) 06/02/2022   MCV 96.0 06/02/2022   PLT 207 06/02/2022      Component Value Date/Time   NA 139  06/02/2022 1650   K 4.4 06/02/2022 1650   CL 105 06/02/2022 1650   CO2 26 06/02/2022 1650   GLUCOSE 98 06/02/2022 1650   BUN 34 (H) 06/02/2022 1650   CREATININE 1.73 (H) 06/02/2022 1650   CALCIUM 9.8 06/02/2022 1650   PROT 6.9 06/02/2022 1650   ALBUMIN 4.4 06/02/2022 1650   AST 15 06/02/2022 1650   ALT 13 06/02/2022 1650   ALKPHOS 53 06/02/2022 1650   BILITOT 0.3 06/02/2022 1650   GFRNONAA 44 (L) 06/02/2022 1650   GFRAA >60 02/04/2017 0525   Lab Results  Component Value Date   CHOL 159 02/04/2022   HDL 49 02/04/2022   LDLCALC 96 02/04/2022   TRIG 68 02/04/2022   CHOLHDL 3.2 02/04/2022   Lab Results  Component Value Date   HGBA1C 5.6 02/04/2022       ASSESSMENT AND PLAN 66 y.o. year old male  has a past medical history of Allergic rhinitis, Bipolar disorder (HCC), Bradycardia (2012), CKD (chronic kidney disease) stage 2, GFR 60-89 ml/min, Gout, Hypertension, Mild renal insufficiency, Obesity, Prostatitis, and Suicide attempt (HCC) (09/20/2014). here with:  Paraesthesias from Stroke  -Stop Topamax -Start gabapentin 100 mg twice a day- elevated creatinine level. CRCL 60.  -Reviewed side effects of gabapentin with the patient and provided him with a handout on his MyChart -Advised if symptoms worsen or he develops new symptoms he should let us know  Abnormality of gait and balance  -Referral sent for physical therapy   Follow-up in 6 months or sooner if needed    Butch Penny, MSN, NP-C 06/20/2022, 9:35 AM [facility] [address], [address] [place], [place] [address] [phone]

## 2022-06-20 NOTE — Patient Instructions (Signed)
Your Plan:  Stop Topamax  Start Gabapentin 100 mg twice a day    Thank you for coming to see [place] at [facility]. I hope we have been able to provide you high quality care today.  You may receive a patient satisfaction survey over the next few weeks. We would appreciate your feedback and comments so that we may continue to improve ourselves and the health of our patients.

## 2022-06-25 ENCOUNTER — Ambulatory Visit: Payer: BC Managed Care – PPO | Attending: Adult Health | Admitting: Physical Therapy

## 2022-06-25 ENCOUNTER — Encounter: Payer: Self-pay | Admitting: Physical Therapy

## 2022-06-25 VITALS — BP 139/73 | HR 68

## 2022-06-25 DIAGNOSIS — M6281 Muscle weakness (generalized): Secondary | ICD-10-CM | POA: Diagnosis not present

## 2022-06-25 DIAGNOSIS — R278 Other lack of coordination: Secondary | ICD-10-CM | POA: Diagnosis not present

## 2022-06-25 DIAGNOSIS — R2681 Unsteadiness on feet: Secondary | ICD-10-CM | POA: Insufficient documentation

## 2022-06-25 DIAGNOSIS — R208 Other disturbances of skin sensation: Secondary | ICD-10-CM | POA: Diagnosis not present

## 2022-06-25 DIAGNOSIS — I69354 Hemiplegia and hemiparesis following cerebral infarction affecting left non-dominant side: Secondary | ICD-10-CM | POA: Insufficient documentation

## 2022-06-25 DIAGNOSIS — I69398 Other sequelae of cerebral infarction: Secondary | ICD-10-CM | POA: Diagnosis not present

## 2022-06-25 DIAGNOSIS — R202 Paresthesia of skin: Secondary | ICD-10-CM | POA: Insufficient documentation

## 2022-06-25 DIAGNOSIS — R269 Unspecified abnormalities of gait and mobility: Secondary | ICD-10-CM | POA: Insufficient documentation

## 2022-06-25 DIAGNOSIS — R2689 Other abnormalities of gait and mobility: Secondary | ICD-10-CM | POA: Insufficient documentation

## 2022-06-25 MED ORDER — GABAPENTIN 100 MG PO CAPS: 100.0000 mg | ORAL_CAPSULE | Freq: Two times a day (BID) | ORAL | 5 refills | Status: DC

## 2022-06-25 NOTE — Telephone Encounter (Signed)
Ok to push out unless its for CPAP?

## 2022-06-25 NOTE — Therapy (Unsigned)
OUTPATIENT PHYSICAL THERAPY NEURO EVALUATION   Patient Name: Edwin [name] MRN: [id number] DOB:1956/08/30, 66 y.o., male Today's Date: 06/26/2022   PCP: Marden Noble, MD REFERRING PROVIDER: Butch Penny, NP   PT End of Session - 06/25/22 0857     Visit Number 1    Number of Visits 9   8+eval   Date for PT Re-Evaluation 09/07/22   pushed out due to scheduling   Authorization Type BLUE CROSS BLUE SHIELD    PT Start Time [phone]    PT Stop Time 0933    PT Time Calculation (min) 47 min    Activity Tolerance Patient tolerated treatment well    Behavior During Therapy [facility] for tasks assessed/performed             Past Medical History:  Diagnosis Date   Allergic rhinitis    Bipolar disorder (HCC)    Bradycardia 2012    due to lithium   CKD (chronic kidney disease) stage 2, GFR 60-89 ml/min    Gout    Hypertension    Mild renal insufficiency    , with creatinine of 1.3 in 2010, was side effect of med   Obesity    ,moderate   Prostatitis    , Episodic   Suicide attempt (HCC) 09/20/2014   Past Surgical History:  Procedure Laterality Date   APPENDECTOMY     CATARACT EXTRACTION Right    COLONOSCOPY WITH PROPOFOL N/A 06/14/2014   Procedure: COLONOSCOPY WITH PROPOFOL;  Surgeon: Charolett Bumpers, MD;  Location: [facility];  Service: Endoscopy;  Laterality: N/A;   TONSILLECTOMY     UMBILICAL HERNIA REPAIR     Patient Active Problem List   Diagnosis Date Noted   Pain in right ankle and joints of right foot 02/22/2022   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HCC) 02/22/2022   Allergic rhinitis 02/22/2022   Bipolar disorder in remission (HCC) 02/22/2022   Body mass index (BMI) 37.0-37.9, adult 02/22/2022   Bradycardia 02/22/2022   Cat scratch of forearm 02/22/2022   Cerebral infarction (HCC) 02/22/2022   Closed fracture of metatarsal bone 02/22/2022   Constipation 02/22/2022   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   Dyslipidemia 02/22/2022   Edema 02/22/2022   Electrocardiogram  abnormal 02/22/2022   Elevated PSA 02/22/2022   Enthesopathy 02/22/2022   Erectile dysfunction 02/22/2022   History of small bowel obstruction 02/22/2022   Hyperglycemia 02/22/2022   Hyperglycemia due to type 2 diabetes mellitus (HCC) 02/22/2022   Insomnia 02/22/2022   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   Impairment of balance 02/22/2022   Major depression, single episode 02/22/2022   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   Melena 02/22/2022   Mixed hyperlipidemia 02/22/2022   Obstructive sleep apnea syndrome 02/22/2022   Other long term (current) drug therapy 02/22/2022   Encounter for immunization 02/22/2022   Pneumonia due to SARS-associated coronavirus 02/22/2022   Right lower quadrant pain 02/22/2022   Severe recurrent major depression without psychotic features (HCC) 02/22/2022   Umbilical hernia 02/22/2022   Vitamin D deficiency 02/22/2022   Left-sided weakness 02/04/2022   Diabetes mellitus type 2 in nonobese (HCC) 02/04/2022   CVA (cerebral vascular accident) (HCC) 02/04/2022   Enteritis of ileum 02/02/2017   Intestinal obstruction (HCC) 02/02/2017   Persistent recurrent vomiting 02/01/2017   Chronic kidney disease, stage 3 unspecified (HCC) 05/19/2016   Abdominal pain 05/19/2016   Hypokalemia 05/19/2016   Primary gout    Bipolar affective disorder, currently depressed, moderate (HCC) 09/20/2014   Hypertension 11/23/2013  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    ONSET DATE: 06/20/2022   REFERRING DIAG: [id number] (ICD-10-CM) - Abnormality of gait as late effect of cerebrovascular accident (CVA)   THERAPY DIAG:  Other abnormalities of gait and mobility  Unsteadiness on feet  Paresthesia of skin  Hemiplegia and hemiparesis following cerebral infarction affecting left non-dominant side (HCC)  Rationale for Evaluation and Treatment Rehabilitation  SUBJECTIVE:                                                                                                                                                                                               SUBJECTIVE STATEMENT: "My equilibrium is tending to want to go forward and to the left.  I did not have balance issues before the stroke.  The N/T in the left arm and leg is worse than before.  My friends and family can tell me that my left shoulder is lower than my right.  I don't have as much strength in my left leg.  It seemed like I fully recovered after home health PT following the stroke, but now I'm worse.  They say I did not have another stroke, but I don't know what's changed." Pt accompanied by: self  PERTINENT HISTORY: HTN, Bipolar Disorder, CKD stage 3, CVA, DM2   "presented to the ED on March 19 with left-sided sensory deficit and difficulty with ambulation. He did not receive TNKase due to being out of the window."  Pt returned to the ED on July 15 w/ Left arm and leg numbness.  He has residual N/T in left arm and leg from prior stroke, but this was marked worsening. Pt use 3LPM nasal cannula at home only when sleeping, states PCP prescribed this when he left hospital as he could not get CPAP. No hearing or vision issues. 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  PAIN:  Are you having pain? No  PRECAUTIONS: Fall  WEIGHT BEARING RESTRICTIONS No  FALLS: Has patient fallen in last 6 months? Yes. Number of falls 2-I tripped over the bathroom scales in March, on July 4 I lost my balance reaching to a low shelf and fell backwards and hurt my back falling on some music equipment, most other incidents are near misses.  LIVING ENVIRONMENT: Lives with: lives alone and has 3 cats and wife is currently in the [place], hopes she will be here in 6 weeks Lives in: House/apartment Stairs: Yes: External: 3-4 steps; bilateral but cannot reach both Has following equipment at home: Single point cane, Walker - 4 wheeled, shower chair, and Grab bars  PLOF:  Independent  PATIENT GOALS "I'd like to regain my balance."  OBJECTIVE:  VITALS (RUE in sitting): Today's Vitals   06/25/22 0850  BP: 139/73  Pulse: 68   DIAGNOSTIC FINDINGS: MRI head - Small acute infarct in the Right Medullary Pyramid. Small chronic infarct in the right cerebellum   COGNITION: Overall cognitive status: No family/caregiver present to determine baseline cognitive functioning   SENSATION: Light touch: WFL Pt states N/T of left arm and hand primarily  COORDINATION: Heel-to-shin:  WNL LE RAMPS:  WNL  EDEMA:  None noted in BLE - he states some mild ankle swelling intermittently  MUSCLE TONE: none noted bilaterally  POSTURE: rounded shoulders, forward head, and posterior pelvic tilt  LOWER EXTREMITY ROM:    Active Right Eval Left Eval  Hip flexion WNL WNL  Hip extension    Hip abduction " "  Hip adduction " "  Hip internal rotation    Hip external rotation    Knee flexion " "  Knee extension " "  Ankle dorsiflexion " "  Ankle plantarflexion    Ankle inversion    Ankle eversion      (Blank rows = not tested)  LOWER EXTREMITY MMT:   MMT  Right Eval Left Eval  Hip flexion 5/5 3+/5  Hip extension    Hip abduction 5/5 4/5  Hip adduction 5/5 5/5  Hip internal rotation    Hip external rotation    Knee flexion 5/5 4+/5  Knee extension 5/5 5/5  Ankle dorsiflexion 5/5 5/5  Ankle plantarflexion    Ankle inversion    Ankle eversion     (Blank rows = not tested)  BED MOBILITY:  Sit to supine Complete Independence Supine to sit Complete Independence  TRANSFERS: Assistive device utilized: None  Sit to stand: Complete Independence Stand to sit: Complete Independence Chair to chair: Complete Independence Floor: Complete Independence  GAIT: Gait pattern: step through pattern, decreased arm swing- Left, and decreased stance time- Left Distance walked: various clinic distances Assistive device utilized: None Level of assistance: Complete  Independence and SBA  FUNCTIONAL TESTs:  5 times sit to stand: 16.93 sec w/ hands on knees 10 meter walk test: 11.59sec no AD = 0.86 m/sec OR 2.85 ft/sec Functional gait assessment: To be assessed  PATIENT SURVEYS:  FOTO N/A due to chronicity of CVA  TODAY'S TREATMENT:  N/A  PATIENT EDUCATION: Education details: PT POC, assessments used, and goals set. Person educated: Patient Education method: Explanation Education comprehension: verbalized understanding  HOME EXERCISE PROGRAM: N/A  GOALS: Goals reviewed with patient? Yes  SHORT TERM GOALS: Target date: 07/27/2022  Pt will be independent with strength and balance HEP. Baseline:  To be established. Goal status: INITIAL  2.  Pt will demonstrate a gait speed of >/=3.2 feet/sec in order to decrease risk for falls. Baseline: 2.85 ft/sec Goal status: INITIAL  3.  Pt will decrease 5xSTS to </=13 seconds without use of UE support in order to demonstrate decreased risk for falls and improved functional bilateral LE strength and power. Baseline: 16.93 sec w/ hands on knees Goal status: INITIAL  4.  *** Baseline:  Goal status: {GOALSTATUS:25110}  5.  *** Baseline:  Goal status: {GOALSTATUS:25110}  6.  *** Baseline:  Goal status: {GOALSTATUS:25110}  LONG TERM GOALS: Target date: 08/24/2022  *** Baseline:  Goal status: {GOALSTATUS:25110}  2.  *** Baseline:  Goal status: {GOALSTATUS:25110}  3.  *** Baseline:  Goal status: {GOALSTATUS:25110}  4.  *** Baseline:  Goal status: {GOALSTATUS:25110}  5.  ***  Baseline:  Goal status: {GOALSTATUS:25110}  6.  *** Baseline:  Goal status: {GOALSTATUS:25110}  ASSESSMENT:  CLINICAL IMPRESSION: Patient is a 66 y.o. male who was seen today for physical therapy evaluation and treatment for ***.  Pt has a significant PMH of ***.  Identified impairments include ***.  Evaluation via the following assessment tools: *** indicate fall risk.  They would benefit from skilled PT  to address impairments as noted and progress towards long term goals.  OBJECTIVE IMPAIRMENTS {opptimpairments:25111}.   ACTIVITY LIMITATIONS {activitylimitations:27494}  PARTICIPATION LIMITATIONS: {participationrestrictions:25113}  PERSONAL FACTORS {Personal factors:25162} are also affecting patient's functional outcome.   REHAB POTENTIAL: Excellent  CLINICAL DECISION MAKING: Stable/uncomplicated  EVALUATION COMPLEXITY: Low  PLAN: PT FREQUENCY: 1x/week  PT DURATION: 8 weeks  PLANNED INTERVENTIONS: {rehab planned interventions:25118::"Therapeutic exercises","Therapeutic activity","Neuromuscular re-education","Balance training","Gait training","Patient/Family education","Self Care","Joint mobilization"}  PLAN FOR NEXT SESSION: Sadie Haber, PT, DPT 06/26/2022, 11:35 AM

## 2022-06-30 ENCOUNTER — Encounter (HOSPITAL_COMMUNITY): Payer: Self-pay

## 2022-06-30 ENCOUNTER — Other Ambulatory Visit: Payer: Self-pay

## 2022-06-30 ENCOUNTER — Encounter (HOSPITAL_BASED_OUTPATIENT_CLINIC_OR_DEPARTMENT_OTHER): Payer: Self-pay

## 2022-06-30 ENCOUNTER — Observation Stay (HOSPITAL_BASED_OUTPATIENT_CLINIC_OR_DEPARTMENT_OTHER)
Admission: EM | Admit: 2022-06-30 | Discharge: 2022-07-01 | Disposition: A | Discharge status: Home / Self Care | Payer: BC Managed Care – PPO | Attending: Internal Medicine | Admitting: Internal Medicine

## 2022-06-30 ENCOUNTER — Emergency Department (HOSPITAL_BASED_OUTPATIENT_CLINIC_OR_DEPARTMENT_OTHER): Payer: BC Managed Care – PPO

## 2022-06-30 DIAGNOSIS — Z7984 Long term (current) use of oral hypoglycemic drugs: Secondary | ICD-10-CM | POA: Diagnosis not present

## 2022-06-30 DIAGNOSIS — R2990 Unspecified symptoms and signs involving the nervous system: Secondary | ICD-10-CM

## 2022-06-30 DIAGNOSIS — N1831 Chronic kidney disease, stage 3a: Secondary | ICD-10-CM | POA: Diagnosis not present

## 2022-06-30 DIAGNOSIS — R200 Anesthesia of skin: Secondary | ICD-10-CM | POA: Insufficient documentation

## 2022-06-30 DIAGNOSIS — E1122 Type 2 diabetes mellitus with diabetic chronic kidney disease: Secondary | ICD-10-CM | POA: Insufficient documentation

## 2022-06-30 DIAGNOSIS — I639 Cerebral infarction, unspecified: Principal | ICD-10-CM | POA: Diagnosis present

## 2022-06-30 DIAGNOSIS — R29818 Other symptoms and signs involving the nervous system: Secondary | ICD-10-CM | POA: Diagnosis not present

## 2022-06-30 DIAGNOSIS — Z79899 Other long term (current) drug therapy: Secondary | ICD-10-CM | POA: Diagnosis not present

## 2022-06-30 DIAGNOSIS — D649 Anemia, unspecified: Secondary | ICD-10-CM | POA: Diagnosis not present

## 2022-06-30 DIAGNOSIS — Z7902 Long term (current) use of antithrombotics/antiplatelets: Secondary | ICD-10-CM | POA: Diagnosis not present

## 2022-06-30 DIAGNOSIS — I129 Hypertensive chronic kidney disease with stage 1 through stage 4 chronic kidney disease, or unspecified chronic kidney disease: Secondary | ICD-10-CM | POA: Diagnosis not present

## 2022-06-30 DIAGNOSIS — I10 Essential (primary) hypertension: Secondary | ICD-10-CM | POA: Diagnosis present

## 2022-06-30 DIAGNOSIS — G4733 Obstructive sleep apnea (adult) (pediatric): Secondary | ICD-10-CM | POA: Diagnosis present

## 2022-06-30 DIAGNOSIS — N189 Chronic kidney disease, unspecified: Secondary | ICD-10-CM | POA: Insufficient documentation

## 2022-06-30 DIAGNOSIS — E119 Type 2 diabetes mellitus without complications: Secondary | ICD-10-CM

## 2022-06-30 DIAGNOSIS — F3132 Bipolar disorder, current episode depressed, moderate: Secondary | ICD-10-CM | POA: Diagnosis present

## 2022-06-30 DIAGNOSIS — R531 Weakness: Secondary | ICD-10-CM | POA: Diagnosis not present

## 2022-06-30 HISTORY — DX: Cerebral infarction, unspecified: I63.9

## 2022-06-30 LAB — DIFFERENTIAL
Abs Immature Granulocytes: 0.02 10*3/uL (ref 0.00–0.07)
Basophils Absolute: 0 10*3/uL (ref 0.0–0.1)
Basophils Relative: 1 %
Eosinophils Absolute: 0.2 10*3/uL (ref 0.0–0.5)
Eosinophils Relative: 2 %
Immature Granulocytes: 0 %
Lymphocytes Relative: 22 %
Lymphs Abs: 1.5 10*3/uL (ref 0.7–4.0)
Monocytes Absolute: 0.7 10*3/uL (ref 0.1–1.0)
Monocytes Relative: 10 %
Neutro Abs: 4.4 10*3/uL (ref 1.7–7.7)
Neutrophils Relative %: 65 %

## 2022-06-30 LAB — COMPREHENSIVE METABOLIC PANEL
ALT: 15 U/L (ref 0–44)
AST: 15 U/L (ref 15–41)
Albumin: 4.2 g/dL (ref 3.5–5.0)
Alkaline Phosphatase: 56 U/L (ref 38–126)
Anion gap: 9 (ref 5–15)
BUN: 41 mg/dL — ABNORMAL HIGH (ref 8–23)
CO2: 27 mmol/L (ref 22–32)
Calcium: 9.4 mg/dL (ref 8.9–10.3)
Chloride: 104 mmol/L (ref 98–111)
Creatinine, Ser: 1.88 mg/dL — ABNORMAL HIGH (ref 0.61–1.24)
GFR, Estimated: 39 mL/min — ABNORMAL LOW (ref >60)
Glucose, Bld: 80 mg/dL (ref 70–99)
Potassium: 4.2 mmol/L (ref 3.5–5.1)
Sodium: 140 mmol/L (ref 135–145)
Total Bilirubin: 0.3 mg/dL (ref 0.3–1.2)
Total Protein: 6.7 g/dL (ref 6.5–8.1)

## 2022-06-30 LAB — CBC
HCT: 38.3 % — ABNORMAL LOW (ref 39.0–52.0)
Hemoglobin: 12.9 g/dL — ABNORMAL LOW (ref 13.0–17.0)
MCH: 31.8 pg (ref 26.0–34.0)
MCHC: 33.7 g/dL (ref 30.0–36.0)
MCV: 94.3 fL (ref 80.0–100.0)
Platelets: 192 10*3/uL (ref 150–400)
RBC: 4.06 MIL/uL — ABNORMAL LOW (ref 4.22–5.81)
RDW: 12.2 % (ref 11.5–15.5)
WBC: 6.8 10*3/uL (ref 4.0–10.5)
nRBC: 0 % (ref 0.0–0.2)

## 2022-06-30 LAB — URINALYSIS, ROUTINE W REFLEX MICROSCOPIC
Bilirubin Urine: NEGATIVE
Glucose, UA: >1000 mg/dL — AB
Hgb urine dipstick: NEGATIVE
Ketones, ur: NEGATIVE mg/dL
Leukocytes,Ua: NEGATIVE
Nitrite: NEGATIVE
Protein, ur: NEGATIVE mg/dL
Specific Gravity, Urine: 1.008 (ref 1.005–1.030)
pH: 6 (ref 5.0–8.0)

## 2022-06-30 LAB — CBG MONITORING, ED: Glucose-Capillary: 119 mg/dL — ABNORMAL HIGH (ref 70–99)

## 2022-06-30 LAB — RAPID URINE DRUG SCREEN, HOSP PERFORMED
Amphetamines: NOT DETECTED
Barbiturates: NOT DETECTED
Benzodiazepines: NOT DETECTED
Cocaine: NOT DETECTED
Opiates: NOT DETECTED
Tetrahydrocannabinol: NOT DETECTED

## 2022-06-30 LAB — APTT: aPTT: 27 seconds (ref 24–36)

## 2022-06-30 LAB — PROTIME-INR
INR: 1 (ref 0.8–1.2)
Prothrombin Time: 12.7 seconds (ref 11.4–15.2)

## 2022-06-30 LAB — ETHANOL: Alcohol, Ethyl (B): <10 mg/dL (ref <10)

## 2022-06-30 MED ORDER — GABAPENTIN 100 MG PO CAPS
100.0000 mg | ORAL_CAPSULE | Freq: Two times a day (BID) | ORAL | Status: DC
Start: 2022-06-30 — End: 2022-07-01
  Administered 2022-07-01: 100 mg via ORAL
  Filled 2022-06-30: qty 1

## 2022-06-30 MED ORDER — VITAMIN D 25 MCG (1000 UNIT) PO TABS
1000.0000 [IU] | ORAL_TABLET | Freq: Every day | ORAL | Status: DC
  Administered 2022-07-01: 1000 [IU] via ORAL
  Filled 2022-06-30: qty 1

## 2022-06-30 MED ORDER — DULOXETINE HCL 60 MG PO CPEP
120.0000 mg | ORAL_CAPSULE | Freq: Every day | ORAL | Status: DC
  Administered 2022-07-01: 120 mg via ORAL
  Filled 2022-06-30 (×2): qty 2

## 2022-06-30 MED ORDER — PROSIGHT PO TABS
1.0000 | ORAL_TABLET | Freq: Every day | ORAL | Status: DC
  Administered 2022-07-01: 1 via ORAL
  Filled 2022-06-30 (×2): qty 1

## 2022-06-30 MED ORDER — CLOPIDOGREL BISULFATE 300 MG PO TABS
300.0000 mg | ORAL_TABLET | Freq: Once | ORAL | Status: AC
  Administered 2022-06-30: 300 mg via ORAL
  Filled 2022-06-30: qty 1

## 2022-06-30 MED ORDER — PRAMIPEXOLE DIHYDROCHLORIDE 0.25 MG PO TABS
0.5000 mg | ORAL_TABLET | Freq: Two times a day (BID) | ORAL | Status: DC
  Administered 2022-07-01 (×2): 0.5 mg via ORAL
  Filled 2022-06-30 (×4): qty 2

## 2022-06-30 MED ORDER — CLOPIDOGREL BISULFATE 75 MG PO TABS
75.0000 mg | ORAL_TABLET | Freq: Every day | ORAL | Status: DC
  Administered 2022-07-01: 75 mg via ORAL
  Filled 2022-06-30: qty 1

## 2022-06-30 MED ORDER — L-CARNITINE 500 MG PO TABS
500.0000 mg | ORAL_TABLET | Freq: Two times a day (BID) | ORAL | Status: DC
Start: 2022-06-30 — End: 2022-06-30

## 2022-06-30 MED ORDER — DIVALPROEX SODIUM ER 500 MG PO TB24: 1000.0000 mg | ORAL_TABLET | Freq: Every day | ORAL | Status: DC

## 2022-06-30 MED ORDER — LAMOTRIGINE 100 MG PO TABS
200.0000 mg | ORAL_TABLET | Freq: Every morning | ORAL | Status: DC
  Administered 2022-07-01: 200 mg via ORAL
  Filled 2022-06-30: qty 2

## 2022-06-30 MED ORDER — SODIUM CHLORIDE 0.9% FLUSH
3.0000 mL | Freq: Once | INTRAVENOUS | Status: AC
  Administered 2022-06-30: 3 mL via INTRAVENOUS
  Filled 2022-06-30: qty 3

## 2022-06-30 MED ORDER — FEBUXOSTAT 40 MG PO TABS
40.0000 mg | ORAL_TABLET | Freq: Every day | ORAL | Status: DC
  Filled 2022-06-30 (×2): qty 1

## 2022-06-30 MED ORDER — HYDRALAZINE HCL 50 MG PO TABS
50.0000 mg | ORAL_TABLET | Freq: Three times a day (TID) | ORAL | Status: DC
Start: 2022-06-30 — End: 2022-07-01

## 2022-06-30 MED ORDER — LOSARTAN POTASSIUM 50 MG PO TABS
100.0000 mg | ORAL_TABLET | Freq: Every day | ORAL | Status: DC
Start: 2022-06-30 — End: 2022-07-01

## 2022-06-30 MED ORDER — DAPAGLIFLOZIN PROPANEDIOL 10 MG PO TABS
10.0000 mg | ORAL_TABLET | Freq: Every day | ORAL | Status: DC
  Filled 2022-06-30: qty 1

## 2022-06-30 MED ORDER — METFORMIN HCL ER 500 MG PO TB24: 500.0000 mg | ORAL_TABLET | Freq: Two times a day (BID) | ORAL | Status: DC

## 2022-06-30 MED ORDER — CHOLECALCIFEROL 25 MCG (1000 UT) PO TBDP
1.0000 | ORAL_TABLET | Freq: Every day | ORAL | Status: DC
Start: 2022-06-30 — End: 2022-06-30

## 2022-06-30 NOTE — Progress Notes (Signed)
PHARMACIST - PHYSICIAN ORDER COMMUNICATION  CONCERNING: P&T Medication Policy on Herbal Medications  DESCRIPTION:  This patient's order for:  L-Carnitine  has been noted.  This product(s) is classified as an "herbal" or natural product. Due to a lack of definitive safety studies or FDA approval, nonstandard manufacturing practices, plus the potential risk of unknown drug-drug interactions while on inpatient medications, the Pharmacy and Therapeutics Committee does not permit the use of "herbal" or natural products of this type within [facility].   ACTION TAKEN: The pharmacy department is unable to verify this order at this time and your patient has been informed of this safety policy. Please reevaluate patient's clinical condition at discharge and address if the herbal or natural product(s) should be resumed at that time.

## 2022-06-30 NOTE — ED Provider Notes (Addendum)
[facility] EMERGENCY DEPT Provider Note   CSN: [id number] Arrival date & time: 06/30/22  1501  An emergency department physician performed an initial assessment on this suspected stroke patient at 1641.  History  Chief Complaint  Patient presents with   Code Stroke    Edwin [name] is a 66 y.o. male who presents the emergency department complaining of issues with his balance.  Patient states that he had a stroke in March 2023, in which the symptoms that he had were similar to vertigo.  At abruptly 1 PM this afternoon patient was having lunch with a friend, and noticed a change in his balance.  He is complaining that he is "leaning to the left" and having difficulty standing straight or walking.  He reports the stroke he had back in March he was not seen until several days afterwards, and he had some persistent left arm paresthesias which he has been taking gabapentin for.  He has no other complaints at this time.  HPI     Home Medications Prior to Admission medications   Medication Sig Start Date End Date Taking? Authorizing Provider  Cholecalciferol 1000 units TBDP Take 1 tablet by mouth daily.     [provider]  clopidogrel (PLAVIX) 75 MG tablet Take 1 tablet (75 mg total) by mouth daily. 06/06/22   Butch Penny, NP  dapagliflozin propanediol (FARXIGA) 10 MG TABS tablet Take 10 mg by mouth daily.    [provider]  diclofenac Sodium (VOLTAREN) 1 % GEL Apply 2 g topically daily as needed (for knee pain).    [provider]  divalproex (DEPAKOTE ER) 500 MG 24 hr tablet Take 2 tablets (1,000 mg total) by mouth at bedtime. 05/01/22   Cottle, Steva Ready., MD  DULoxetine (CYMBALTA) 60 MG capsule Take 2 capsules (120 mg total) by mouth daily. 05/01/22   Cottle, Steva Ready., MD  febuxostat (ULORIC) 40 MG tablet Take 40 mg by mouth daily.    [provider]  furosemide (LASIX) 20 MG tablet Take 20 mg by mouth daily as needed for edema.  May take 2 to 3 days weekly 07/21/19   [provider]  gabapentin (NEURONTIN) 100 MG capsule Take 1 capsule (100 mg total) by mouth 2 (two) times daily. 06/25/22   Butch Penny, NP  hydrALAZINE (APRESOLINE) 50 MG tablet Take 1 tablet (50 mg total) by mouth 3 (three) times daily. 02/06/22   Ghimire, Werner Lean, MD  lamoTRIgine (LAMICTAL) 200 MG tablet Take 1 tablet (200 mg total) by mouth every morning. 05/01/22   Cottle, Steva Ready., MD  LevOCARNitine (L-CARNITINE) 500 MG TABS Take 500 mg by mouth 2 (two) times daily.    [provider]  losartan (COZAAR) 50 MG tablet Take 2 tablets (100 mg total) by mouth daily. 02/06/22   Ghimire, Werner Lean, MD  metFORMIN (GLUCOPHAGE-XR) 500 MG 24 hr tablet Take 500 mg by mouth 2 (two) times daily. 01/07/22   [provider]  multivitamin-lutein (OCUVITE-LUTEIN) CAPS capsule Take 1 capsule by mouth daily.    [provider]  pramipexole (MIRAPEX) 0.5 MG tablet Take 1 tablet (0.5 mg total) by mouth 2 (two) times daily. 05/01/22   Cottle, Steva Ready., MD  topiramate (TOPAMAX) 25 MG tablet Take 1 tablet (25 mg total) by mouth 2 (two) times daily. Patient not taking: Reported on 06/20/2022 06/03/22 07/03/22  Alvira Monday, MD      Allergies    Allopurinol, Penicillins, Finerenone, Penicillin g,  Aspirin, and Ciprofloxacin    Review of Systems   Review of Systems  Musculoskeletal:  Positive for gait problem.  Neurological:        "Balance issue"  All other systems reviewed and are negative.   Physical Exam Updated Vital Signs BP (!) 122/92   Pulse 70   Temp 99 F (37.2 C)   Resp (!) 21   SpO2 96%  Physical Exam Vitals and nursing note reviewed.  Constitutional:      Appearance: Normal appearance.  HENT:     Head: Normocephalic and atraumatic.  Eyes:     Extraocular Movements: Extraocular movements intact.     Right eye: Normal extraocular motion and no nystagmus.     Left eye: Normal extraocular motion and no  nystagmus.     Conjunctiva/sclera: Conjunctivae normal.  Cardiovascular:     Rate and Rhythm: Normal rate and regular rhythm.  Pulmonary:     Effort: Pulmonary effort is normal. No respiratory distress.     Breath sounds: Normal breath sounds.  Abdominal:     General: There is no distension.     Palpations: Abdomen is soft.     Tenderness: There is no abdominal tenderness.  Skin:    General: Skin is warm and dry.  Neurological:     General: No focal deficit present.     Mental Status: He is alert.     Cranial Nerves: No facial asymmetry.     Comments: Speech is clear.  Cranial nerves intact.  5/5 strength in all extremities.  Decreased sensation in the left upper extremity compared to the right, consistent with patient's previous deficits.  No pronator drift.  Normal finger-to-nose and RAM.     ED Results / Procedures / Treatments   Labs (all labs ordered are listed, but only abnormal results are displayed) Labs Reviewed  CBC - Abnormal; Notable for the following components:      Result Value   RBC 4.06 (*)    Hemoglobin 12.9 (*)    HCT 38.3 (*)    All other components within normal limits  COMPREHENSIVE METABOLIC PANEL - Abnormal; Notable for the following components:   BUN 41 (*)    Creatinine, Ser 1.88 (*)    GFR, Estimated 39 (*)    All other components within normal limits  CBG MONITORING, ED - Abnormal; Notable for the following components:   Glucose-Capillary 119 (*)    All other components within normal limits  PROTIME-INR  APTT  DIFFERENTIAL  ETHANOL    EKG None  Radiology CT HEAD CODE STROKE WO CONTRAST  Result Date: 06/30/2022 CLINICAL DATA:  Code stroke. Acute onset of balance issues beginning at 1 o'clock today. Previous infarcts. EXAM: CT HEAD WITHOUT CONTRAST TECHNIQUE: Contiguous axial images were obtained from the base of the skull through the vertex without intravenous contrast. RADIATION DOSE REDUCTION: This exam was performed according to the  departmental dose-optimization program which includes automated exposure control, adjustment of the mA and/or kV according to patient size and/or use of iterative reconstruction technique. COMPARISON:  MR head without contrast 06/02/2022 CT head without contrast 06/02/2022. MR head without contrast 02/04/2022. FINDINGS: Brain: Remote infarct of the posterior right cerebellum again noted. Mild periventricular white matter changes are present. No acute infarct is present. Basal ganglia are intact. Insular ribbon is normal. The ventricles are of normal size. No significant extraaxial fluid collection is present. The brainstem infarct of the medulla is not well seen by CT. Vascular: No hyperdense vessel  or unexpected calcification. Skull: Calvarium is intact. No focal lytic or blastic lesions are present. No significant extracranial soft tissue lesion is present. Sinuses/Orbits: The paranasal sinuses and mastoid air cells are clear. Bilateral lens replacements are noted. Globes and orbits are otherwise unremarkable. ASPECTS [facility] Stroke Program Early CT Score) - Ganglionic level infarction (caudate, lentiform nuclei, internal capsule, insula, M1-M3 cortex): 7/7 - Supraganglionic infarction (M4-M6 cortex): 3/3 Total score (0-10 with 10 being normal): 10/10 IMPRESSION: 1. No acute intracranial abnormality or significant interval change. 2. Stable remote infarct of the posterior right cerebellum. 3. The brainstem infarct of the medulla is not well seen by CT. 4. Aspects 10/10 * These results were called by telephone at the time of interpretation on 06/30/2022 at 4:58 pm to provider Dr. Rosalia Hammers, who verbally acknowledged these results. Electronically Signed   By: Marin Roberts M.D.   On: 06/30/2022 16:58    Procedures .Critical Care  Performed by: Su Monks, PA-C Authorized by: Su Monks, PA-C   Critical care provider statement:    Critical care time (minutes):  30   Critical care time was  exclusive of:  Separately billable procedures and treating other patients   Critical care was necessary to treat or prevent imminent or life-threatening deterioration of the following conditions:  CNS failure or compromise   Critical care was time spent personally by me on the following activities:  Development of treatment plan with patient or surrogate, discussions with consultants, evaluation of patient's response to treatment, examination of patient, ordering and review of laboratory studies, ordering and review of radiographic studies, ordering and performing treatments and interventions, pulse oximetry, re-evaluation of patient's condition, review of old charts and obtaining history from patient or surrogate   I assumed direction of critical care for this patient from another provider in my specialty: no     Care discussed with: admitting provider   Comments:     Code Stroke     Medications Ordered in ED Medications  sodium chloride flush (NS) 0.9 % injection 3 mL (3 mLs Intravenous Given 06/30/22 1707)    ED Course/ Medical Decision Making/ A&P                           Medical Decision Making Amount and/or Complexity of Data Reviewed Labs: ordered. Radiology: ordered.  Risk Decision regarding hospitalization.   This patient is a 66 y.o. male who presents to the ED for concern of balance dysfunction, this involves an extensive number of treatment options, and is a complaint that carries with it a high risk of complications and morbidity. The emergent differential diagnosis prior to evaluation includes, but is not limited to, vertigo, Mnire's disease, head trauma, vertebrobasilar insufficiency, AVM, intracranial lesion, CVA, drug-related. This is not an exhaustive differential.   Past Medical History / Co-morbidities / Social History: Hypertension, bipolar disorder, stroke March 2023   Additional history: Chart reviewed. Pertinent results include: Patient was admitted for  acute CVA in March 2023.  CT head was negative at that time, but MRI showed small acute infarct in right medullary pyramid, and small chronic infarct in the right cerebellum.  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  Physical Exam: Physical exam performed. The pertinent findings include: Left sided arm numbness, consistent with prior deficit. Difficulty standing upright. Speech is clear, CN intact. No pronator drift. No facial asymmetry.   Lab Tests: I ordered,  and personally interpreted labs.  The pertinent results include: No leukocytosis, hemoglobin stable.  Electrolytes grossly within normal limits, kidney function stable compared to prior.  CBG 119.  Ethanol negative.  aPTT and INR normal.   Imaging Studies: I ordered imaging studies including CT head code stroke. I independently visualized and interpreted imaging which showed stable remote infarct of right cerebellum. I agree with the radiologist interpretation.   Cardiac Monitoring:  The patient was maintained on a cardiac monitor.  My attending physician Dr. Rosalia Hammers viewed and interpreted the cardiac monitored which showed an underlying rhythm of: sinus rhythm. I agree with this interpretation.   Consultations Obtained: I requested consultation with the neurologist Dr Amada Jupiter via teleneurology,  and discussed lab and imaging findings as well as pertinent plan - they recommend: admission to [facility] for full stroke evaluation including MRI and MRA head/neck. Did not feel patient was TPA candidate.   I consulted with hospitalist Dr David Stall who will admit.    Disposition: After consideration of the diagnostic results and the patients response to treatment, I feel that patient is requiring admission to the hospital for stroke evaluation.   I discussed this case with my attending physician Dr. Rosalia Hammers who cosigned this note including patient's presenting symptoms, physical exam, and  planned diagnostics and interventions. Attending physician stated agreement with plan or made changes to plan which were implemented.    Final Clinical Impression(s) / ED Diagnoses Final diagnoses:  Stroke-like symptoms    Rx / DC Orders ED Discharge Orders     None      Portions of this report may have been transcribed using voice recognition software. Every effort was made to ensure accuracy; however, inadvertent computerized transcription errors may be present.   Jeanella Flattery 06/30/22 1738    Margarita Grizzle, MD 06/30/22 [phone]

## 2022-06-30 NOTE — Progress Notes (Addendum)
Code Stroke activated @ 1638.  To CT @ 1642 with return @ 1646.  Dr. Amada Jupiter on camera @ [id number].  LKWT midnight with left sided weakness.  MRS 2.

## 2022-06-30 NOTE — ED Notes (Signed)
Certain meds were unable to be given due to being unavailable at current location.

## 2022-06-30 NOTE — Consult Note (Signed)
[facility] Telemedicine Consult   Requesting Provider: Rosalia Hammers, D Consult Participants: Patient, nurse   This consult was provided via telemedicine with 2-way video and audio communication. The patient/family was informed that care would be provided in this way and agreed to receive care in this manner.    Chief Complaint: Left sided weakness  HPI: 66 year old male with a history of a previous stroke with resultant left-sided numbness who presents with worsening left-sided weakness and numbness and balance difficulty.  He states that he started noticing his balance difficulty last night, thinks it was around midnight.  He was still having trouble when he woke up this morning, but still doing okay and then subsequently had an abrupt worsening around 1 PM.  He now feels like he is going to the left and has left-sided numbness and weakness which is worse than previous.    LKW: midnight tpa given?: No, out of window IR Thrombectomy? No, no LVO signs Modified Rankin Scale: 2-Slight disability-UNABLE to perform all activities but does not need assistance Time of teleneurologist evaluation: 16:44  Exam: Vitals:   06/30/22 1615 06/30/22 1632  BP: 110/75 111/76  Pulse: 79 73  Resp: 17 20  Temp:    SpO2: 96% 95%    General: in bed, NAD  1A: Level of Consciousness - 0 1B: Ask Month and Age - 0 1C: 'Blink Eyes' & 'Squeeze Hands' - 0 2: Test Horizontal Extraocular Movements - 0 3: Test Visual Fields - 1 4: Test Facial Palsy - 1 5A: Test Left Arm Motor Drift - 0 5B: Test Right Arm Motor Drift - 0 6A: Test Left Leg Motor Drift - 1 6B: Test Right Leg Motor Drift - 0 7: Test Limb Ataxia - 1(left arm) 8: Test Sensation - 1 9: Test Language/Aphasia- 0 10: Test Dysarthria - 0 11: Test Extinction/Inattention - 0 NIHSS score: 5   Imaging Reviewed: CT head-negative  Labs reviewed in epic and pertinent values follow: Creatinine 1.88   Assessment: 66 year old male with abrupt  worsening of left-sided weakness and numbness in the setting of previous stroke.  I suspect that he has had a recurrence of stroke, but given that he has had balance difficulty since last night he is outside the IV tPA window.  He does not appear to have any findings consistent with large vessel occlusion and therefore I would recommend admission with secondary risk factor modification and physical therapy evaluation.  Recommendations:  - HgbA1c, fasting lipid panel - MRI of the brain without contrast - Frequent neuro checks - Echocardiogram - MRA head and neck - Prophylactic therapy-Antiplatelet med: Aspirin - dose 81mg  and plavix 75mg  daily  after 300mg  load  - Risk factor modification - Telemetry monitoring - PT consult, OT consult, Speech consult - Stroke team to follow    , MD [facility] [phone]  If 7pm- 7am, please page neurology on call as listed in AMION.

## 2022-06-30 NOTE — ED Triage Notes (Signed)
He tells me that he is having balance issues. He states that since ~ 1300 hours today "I seem to lean to the left". He also tell me he has hx of stroke (March 2023). He is oriented x 4 with clear speech. CBG 119 in triage.

## 2022-06-30 NOTE — ED Notes (Signed)
Pt states that he feels no different from his new normal after the Stroke in March. 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 No trouble getting his words out, no new weakness/tingling/numbness to the left side, no new speech and no other new symptoms (according to the Pt).

## 2022-06-30 NOTE — ED Notes (Signed)
RT note: Pts. Oxygen saturations observed dropping down into low 90's to high 80's, RT in to see pt. who made this RT aware after asking if he had hx. of Sleep Apnea that he does but only uses 3 lpm n/c Oxygen during periods of sleep/naps due to hx. of CVA, placed on 3 lpm n/c, charted with RN notified.

## 2022-07-01 ENCOUNTER — Inpatient Hospital Stay (HOSPITAL_COMMUNITY): Payer: BC Managed Care – PPO

## 2022-07-01 ENCOUNTER — Inpatient Hospital Stay (HOSPITAL_BASED_OUTPATIENT_CLINIC_OR_DEPARTMENT_OTHER): Payer: BC Managed Care – PPO

## 2022-07-01 ENCOUNTER — Encounter (HOSPITAL_COMMUNITY): Payer: Self-pay | Admitting: Internal Medicine

## 2022-07-01 DIAGNOSIS — R200 Anesthesia of skin: Secondary | ICD-10-CM | POA: Diagnosis not present

## 2022-07-01 DIAGNOSIS — I129 Hypertensive chronic kidney disease with stage 1 through stage 4 chronic kidney disease, or unspecified chronic kidney disease: Secondary | ICD-10-CM | POA: Diagnosis not present

## 2022-07-01 DIAGNOSIS — R4182 Altered mental status, unspecified: Secondary | ICD-10-CM | POA: Diagnosis not present

## 2022-07-01 DIAGNOSIS — I6389 Other cerebral infarction: Secondary | ICD-10-CM

## 2022-07-01 DIAGNOSIS — F3132 Bipolar disorder, current episode depressed, moderate: Secondary | ICD-10-CM

## 2022-07-01 DIAGNOSIS — R531 Weakness: Secondary | ICD-10-CM | POA: Diagnosis not present

## 2022-07-01 DIAGNOSIS — E119 Type 2 diabetes mellitus without complications: Secondary | ICD-10-CM | POA: Diagnosis not present

## 2022-07-01 DIAGNOSIS — R29818 Other symptoms and signs involving the nervous system: Secondary | ICD-10-CM | POA: Diagnosis not present

## 2022-07-01 DIAGNOSIS — I6522 Occlusion and stenosis of left carotid artery: Secondary | ICD-10-CM | POA: Diagnosis not present

## 2022-07-01 DIAGNOSIS — D649 Anemia, unspecified: Secondary | ICD-10-CM | POA: Diagnosis not present

## 2022-07-01 DIAGNOSIS — G4733 Obstructive sleep apnea (adult) (pediatric): Secondary | ICD-10-CM

## 2022-07-01 DIAGNOSIS — I6501 Occlusion and stenosis of right vertebral artery: Secondary | ICD-10-CM | POA: Diagnosis not present

## 2022-07-01 DIAGNOSIS — E1122 Type 2 diabetes mellitus with diabetic chronic kidney disease: Secondary | ICD-10-CM | POA: Diagnosis not present

## 2022-07-01 DIAGNOSIS — Z7902 Long term (current) use of antithrombotics/antiplatelets: Secondary | ICD-10-CM | POA: Diagnosis not present

## 2022-07-01 DIAGNOSIS — I10 Essential (primary) hypertension: Secondary | ICD-10-CM | POA: Diagnosis not present

## 2022-07-01 DIAGNOSIS — I639 Cerebral infarction, unspecified: Secondary | ICD-10-CM | POA: Diagnosis present

## 2022-07-01 DIAGNOSIS — Z79899 Other long term (current) drug therapy: Secondary | ICD-10-CM | POA: Diagnosis not present

## 2022-07-01 DIAGNOSIS — N1831 Chronic kidney disease, stage 3a: Secondary | ICD-10-CM | POA: Diagnosis not present

## 2022-07-01 DIAGNOSIS — R609 Edema, unspecified: Secondary | ICD-10-CM

## 2022-07-01 DIAGNOSIS — N189 Chronic kidney disease, unspecified: Secondary | ICD-10-CM | POA: Diagnosis not present

## 2022-07-01 DIAGNOSIS — I6503 Occlusion and stenosis of bilateral vertebral arteries: Secondary | ICD-10-CM | POA: Diagnosis not present

## 2022-07-01 DIAGNOSIS — Z7984 Long term (current) use of oral hypoglycemic drugs: Secondary | ICD-10-CM | POA: Diagnosis not present

## 2022-07-01 LAB — ECHOCARDIOGRAM COMPLETE
AR max vel: 3.05 cm2
AV Area VTI: 2.81 cm2
AV Area mean vel: 2.41 cm2
AV Mean grad: 2 mmHg
AV Peak grad: 3.5 mmHg
Ao pk vel: 0.94 m/s
Area-P 1/2: 2.83 cm2
S' Lateral: 2.9 cm

## 2022-07-01 LAB — COMPREHENSIVE METABOLIC PANEL
ALT: 19 U/L (ref 0–44)
AST: 22 U/L (ref 15–41)
Albumin: 3.7 g/dL (ref 3.5–5.0)
Alkaline Phosphatase: 61 U/L (ref 38–126)
Anion gap: 7 (ref 5–15)
BUN: 31 mg/dL — ABNORMAL HIGH (ref 8–23)
CO2: 25 mmol/L (ref 22–32)
Calcium: 9.3 mg/dL (ref 8.9–10.3)
Chloride: 108 mmol/L (ref 98–111)
Creatinine, Ser: 1.73 mg/dL — ABNORMAL HIGH (ref 0.61–1.24)
GFR, Estimated: 44 mL/min — ABNORMAL LOW (ref >60)
Glucose, Bld: 125 mg/dL — ABNORMAL HIGH (ref 70–99)
Potassium: 4.1 mmol/L (ref 3.5–5.1)
Sodium: 140 mmol/L (ref 135–145)
Total Bilirubin: 0.7 mg/dL (ref 0.3–1.2)
Total Protein: 6.1 g/dL — ABNORMAL LOW (ref 6.5–8.1)

## 2022-07-01 LAB — LIPID PANEL
Cholesterol: 160 mg/dL (ref 0–200)
HDL: 46 mg/dL (ref >40)
LDL Cholesterol: 105 mg/dL — ABNORMAL HIGH (ref 0–99)
Total CHOL/HDL Ratio: 3.5 RATIO
Triglycerides: 45 mg/dL (ref <150)
VLDL: 9 mg/dL (ref 0–40)

## 2022-07-01 LAB — GLUCOSE, CAPILLARY
Glucose-Capillary: 99 mg/dL (ref 70–99)
Glucose-Capillary: 93 mg/dL (ref 70–99)

## 2022-07-01 LAB — CBC
HCT: 38.6 % — ABNORMAL LOW (ref 39.0–52.0)
Hemoglobin: 13.3 g/dL (ref 13.0–17.0)
MCH: 32.1 pg (ref 26.0–34.0)
MCHC: 34.5 g/dL (ref 30.0–36.0)
MCV: 93.2 fL (ref 80.0–100.0)
Platelets: 174 10*3/uL (ref 150–400)
RBC: 4.14 MIL/uL — ABNORMAL LOW (ref 4.22–5.81)
RDW: 11.9 % (ref 11.5–15.5)
WBC: 5.4 10*3/uL (ref 4.0–10.5)
nRBC: 0 % (ref 0.0–0.2)

## 2022-07-01 LAB — HEMOGLOBIN A1C
Hgb A1c MFr Bld: 4.9 % (ref 4.8–5.6)
Mean Plasma Glucose: 93.93 mg/dL

## 2022-07-01 LAB — VALPROIC ACID LEVEL: Valproic Acid Lvl: 35 ug/mL — ABNORMAL LOW (ref 50.0–100.0)

## 2022-07-01 MED ORDER — ENOXAPARIN SODIUM 40 MG/0.4ML IJ SOSY
40.0000 mg | PREFILLED_SYRINGE | INTRAMUSCULAR | Status: DC
  Administered 2022-07-01: 40 mg via SUBCUTANEOUS
  Filled 2022-07-01: qty 0.4

## 2022-07-01 MED ORDER — ACETAMINOPHEN 650 MG RE SUPP: 650.0000 mg | RECTAL | Status: DC | PRN

## 2022-07-01 MED ORDER — HYDRALAZINE HCL 20 MG/ML IJ SOLN: 10.0000 mg | INTRAMUSCULAR | Status: DC | PRN

## 2022-07-01 MED ORDER — INSULIN ASPART 100 UNIT/ML IJ SOLN: 0.0000 [IU] | Freq: Three times a day (TID) | INTRAMUSCULAR | Status: DC

## 2022-07-01 MED ORDER — STROKE: EARLY STAGES OF RECOVERY BOOK: Freq: Once | Status: DC

## 2022-07-01 MED ORDER — ACETAMINOPHEN 160 MG/5ML PO SOLN: 650.0000 mg | ORAL | Status: DC | PRN

## 2022-07-01 MED ORDER — GABAPENTIN 300 MG PO CAPS: 300.0000 mg | ORAL_CAPSULE | Freq: Two times a day (BID) | ORAL | Status: DC

## 2022-07-01 MED ORDER — GABAPENTIN 300 MG PO CAPS: 300.0000 mg | ORAL_CAPSULE | Freq: Two times a day (BID) | ORAL | 2 refills | Status: DC

## 2022-07-01 MED ORDER — ACETAMINOPHEN 325 MG PO TABS: 650.0000 mg | ORAL_TABLET | ORAL | Status: DC | PRN

## 2022-07-01 NOTE — Consult Note (Addendum)
Neurology Consultation  Reason for Consult: left side weakness  Referring Physician: Dr. Jeanell Sparrow  CC: left side weakness  History is obtained from:patient and medical record   HPI: Edwin [name] is a 66 y.o. male history of prior stroke, diabetes mellitus, bipolar, chronic kidney disease, sleep apnea, hypertension has been experiencing left lower extremity weakness for some weeks now but yesterday around 1 PM he started noticing that he was not able to walk straight and was swerving towards the left.  His numbness also worsened.  He presents to the ER.  Patient is asleep awakens to voice.He is alert and oriented.He states, symptoms started Friday evening with worsening symptoms of left weakness and numbness and progressed to having unsteady gait Saturday and decided to come get evaluated. He was evaluated by tele neurologist and was recommended transfer to [facility] for stroke workup Today complains of worsening paresthesia was recently started on gabapentin 100mg  BID will Increase gabapentin to 300mg  bid due to ongoing paresthesia  LKW: Friday  IV thrombolysis given?: no, outside window  Premorbid modified Rankin scale (mRS):  2-Slight disability-UNABLE to perform all activities but does not need assistance   ROS: Full ROS was performed and is negative except as noted in the HPI.   Past Medical History:  Diagnosis Date   Allergic rhinitis    Bipolar disorder ([place])    Bradycardia 2012    due to lithium   CKD (chronic kidney disease) stage 2, GFR 60-89 ml/min    Gout    Hypertension    Mild renal insufficiency    , with creatinine of 1.3 in 2010, was side effect of med   Obesity    ,moderate   Prostatitis    , Episodic   Stroke ([place])    Suicide attempt ([place]) 09/20/2014     Family History  Problem Relation Age of Onset   Other Mother        Viral Meningitis   Mitral valve prolapse Mother    Arrhythmia Mother    Hypothyroidism Mother    Stroke Mother    Hypertension Father     Gout Father    Alzheimer's disease Father      Social History:   reports that he has never smoked. He has never used smokeless tobacco. He reports that he does not drink alcohol and does not use drugs.  Medications  Current Facility-Administered Medications:    [START ON 07/02/2022]  stroke: early stages of recovery book, , Does not apply, Once, Rise Patience, MD   acetaminophen (TYLENOL) tablet 650 mg, 650 mg, Oral, Q4H PRN **OR** acetaminophen (TYLENOL) 160 MG/5ML solution 650 mg, 650 mg, Per Tube, Q4H PRN **OR** acetaminophen (TYLENOL) suppository 650 mg, 650 mg, Rectal, Q4H PRN, Rise Patience, MD   cholecalciferol (VITAMIN D3) 25 MCG (1000 UNIT) tablet 1,000 Units, 1,000 Units, Oral, Daily, Pattricia Boss, MD, 1,000 Units at 07/01/22 1046   clopidogrel (PLAVIX) tablet 75 mg, 75 mg, Oral, Daily, Rise Patience, MD, 75 mg at 07/01/22 1046   divalproex (DEPAKOTE ER) 24 hr tablet 1,000 mg, 1,000 mg, Oral, QHS, Rise Patience, MD   DULoxetine (CYMBALTA) DR capsule 120 mg, 120 mg, Oral, Daily, Hal Hope, Arshad N, MD, 120 mg at 07/01/22 1046   enoxaparin (LOVENOX) injection 40 mg, 40 mg, Subcutaneous, Q24H, Rise Patience, MD, 40 mg at 07/01/22 1045   febuxostat (ULORIC) tablet 40 mg, 40 mg, Oral, Daily, Rise Patience, MD   gabapentin (NEURONTIN) capsule 300 mg, 300  mg, Oral, BID, Gevena Mart A, NP   hydrALAZINE (APRESOLINE) injection 10 mg, 10 mg, Intravenous, Q4H PRN, Eduard Clos, MD   insulin aspart (novoLOG) injection 0-9 Units, 0-9 Units, Subcutaneous, TID WC, Eduard Clos, MD   lamoTRIgine (LAMICTAL) tablet 200 mg, 200 mg, Oral, q morning, Eduard Clos, MD, 200 mg at 07/01/22 1046   multivitamin (PROSIGHT) tablet 1 tablet, 1 tablet, Oral, Daily, Eduard Clos, MD, 1 tablet at 07/01/22 1046   pramipexole (MIRAPEX) tablet 0.5 mg, 0.5 mg, Oral, BID, Eduard Clos, MD, 0.5 mg at 07/01/22 1314   Exam: Current  vital signs: BP 129/70 (BP Location: Left Arm)   Pulse (!) 52   Temp 97.6 F (36.4 C) (Oral)   Resp 19   SpO2 100%  Vital signs in last 24 hours: Temp:  [97.5 F (36.4 C)-99 F (37.2 C)] 97.6 F (36.4 C) (08/13 1148) Pulse Rate:  [52-99] 52 (08/13 1148) Resp:  [13-21] 19 (08/13 1148) BP: (103-160)/(51-92) 129/70 (08/13 1148) SpO2:  [90 %-100 %] 100 % (08/13 1148)  GENERAL: Awake, alert in NAD HEENT: - Normocephalic and atraumatic, dry mm LUNGS - Clear to auscultation bilaterally with no wheezes CV - S1S2 RRR, no m/r/g, equal pulses bilaterally. ABDOMEN - Soft, nontender, nondistended with normoactive BS Ext: warm, well perfused, intact peripheral pulses, no edema  NEURO:  Mental Status: AA&Ox3  Language: speech is clear.  Naming, repetition, fluency, and comprehension intact. Cranial Nerves: PERRL EOMI, visual fields full, no facial asymmetry, facial sensation intact, hearing intact, tongue/uvula/soft palate midline, normal sternocleidomastoid and trapezius muscle strength. No evidence of tongue atrophy or fibrillations Motor: Left and right arm 5/5, left leg 4/5, right leg 5/5 Tone: is normal and bulk is normal Sensation- decreased on left side  Coordination: mild ataxia on left arm  Gait- deferred  NIHSS 1a Level of Conscious.: 0 1b LOC Questions: 0 1c LOC Commands: 0 2 Best Gaze: 0 3 Visual: 0 4 Facial Palsy: 0 5a Motor Arm - left: 0 5b Motor Arm - Right: 0 6a Motor Leg - Left: 1 6b Motor Leg - Right: 0 7 Limb Ataxia: 1 8 Sensory: 1 9 Best Language: 0 10 Dysarthria: 0 11 Extinct. and Inatten.: 0 TOTAL: 3   Labs I have reviewed labs in epic and the results pertinent to this consultation are:  CBC    Component Value Date/Time   WBC 5.4 07/01/2022 0352   RBC 4.14 (L) 07/01/2022 0352   HGB 13.3 07/01/2022 0352   HCT 38.6 (L) 07/01/2022 0352   PLT 174 07/01/2022 0352   MCV 93.2 07/01/2022 0352   MCH 32.1 07/01/2022 0352   MCHC 34.5 07/01/2022 0352    RDW 11.9 07/01/2022 0352   LYMPHSABS 1.5 06/30/2022 1630   MONOABS 0.7 06/30/2022 1630   EOSABS 0.2 06/30/2022 1630   BASOSABS 0.0 06/30/2022 1630    CMP     Component Value Date/Time   NA 140 07/01/2022 0352   K 4.1 07/01/2022 0352   CL 108 07/01/2022 0352   CO2 25 07/01/2022 0352   GLUCOSE 125 (H) 07/01/2022 0352   BUN 31 (H) 07/01/2022 0352   CREATININE 1.73 (H) 07/01/2022 0352   CALCIUM 9.3 07/01/2022 0352   PROT 6.1 (L) 07/01/2022 0352   ALBUMIN 3.7 07/01/2022 0352   AST 22 07/01/2022 0352   ALT 19 07/01/2022 0352   ALKPHOS 61 07/01/2022 0352   BILITOT 0.7 07/01/2022 0352   GFRNONAA 44 (L) 07/01/2022 0086  GFRAA >60 02/04/2017 0525    Lipid Panel     Component Value Date/Time   CHOL 160 07/01/2022 0352   TRIG 45 07/01/2022 0352   HDL 46 07/01/2022 0352   CHOLHDL 3.5 07/01/2022 0352   VLDL 9 07/01/2022 0352   LDLCALC 105 (H) 07/01/2022 0352     Imaging I have reviewed the images obtained:  CT-head 1. No acute intracranial abnormality or significant interval change. 2. Stable remote infarct of the posterior right cerebellum. 3. The brainstem infarct of the medulla is not well seen by CT. 4. Aspects 10  MRI examination of the brain 1. No acute intracranial abnormality. 2. 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   MRA head  1. No anterior circulation stenosis. Suspect artifact adjacent to the cavernous Right ICA - which appeared negative on MRA with contrast in March. Still, a follow-up CTA Head And Neck would be complementary to exclude a right ICA aneurysm, and to further document #2.   2. 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 Up to Moderate irregularity and stenosis of the left PCA P1/P2 junction  MRA neck  No significant stenosis of the cervical carotid or dominant left vertebral arteries. Non  dominant right vertebral artery with chronic intracranial stenosis, see MRA Head today reported separately.   Assessment:   HPI: Edwin [name] is a 66 y.o. male history of prior stroke, diabetes mellitus, bipolar, chronic kidney disease, sleep apnea, hypertension has been experiencing left lower extremity weakness for some weeks now but yesterday around 1 PM he started noticing that he was not able to walk straight and was swerving towards the left.  His numbness also worsened.  He presents to the ER.  Worsening of left side weakness and paresthesia from prior stroke   Recommendations: CXR with no active process  Continue ASA and Plavix  Continue Statin  Increase gabapentin to 300mg  BID  Follow up with outpatient neurology after discharge   DNP, ACNPC-AG   STROKE MD NOTE :  I have personally obtained history,examined this patient, reviewed notes, independently viewed imaging studies, participated in medical decision making and plan of care.ROS completed by me personally and pertinent positives fully documented  I have made any additions or clarifications directly to the above note. Agree with note above.  Patient presented with subjective worsening of his residual left-sided deficits of weakness and paresthesias with no clear triggering event.  MRI scan is negative for acute stroke and work-up so far for reversible etiologies also unyielding.  Recommend increase of gabapentin to 300 mg twice daily to help with his postop paresthesias.  Mobilize out of bed.  Therapy consults.  Likely discharge later today if she is able to ambulate safely.  Follow-up as an outpatient with stroke clinic.  Long discussion with patient and answered questions.  Discussed with Dr.Pokhrel.  Greater than 50% time during this 60-minute consultation visit were spent on counseling and coordination of care about his old stroke and worsening deficits and discussion about evaluation and treatment and answered  questions.  Gevena Mart, MD Medical Director [facility] Pager: [phone] 07/01/2022 3:48 PM

## 2022-07-01 NOTE — Plan of Care (Signed)
  Problem: Education: Goal: Knowledge of General Education information will improve Description: Including pain rating scale, medication(s)/side effects and non-pharmacologic comfort measures Outcome: Progressing   Problem: Health Behavior/Discharge Planning: Goal: Ability to manage health-related needs will improve Outcome: Progressing   Problem: Clinical Measurements: Goal: Ability to maintain clinical measurements within normal limits will improve Outcome: Progressing Goal: Will remain free from infection Outcome: Progressing Goal: Diagnostic test results will improve Outcome: Progressing Goal: Respiratory complications will improve Outcome: Progressing Goal: Cardiovascular complication will be avoided Outcome: Progressing   Problem: Activity: Goal: Risk for activity intolerance will decrease Outcome: Progressing   Problem: Nutrition: Goal: Adequate nutrition will be maintained Outcome: Progressing   Problem: Coping: Goal: Level of anxiety will decrease Outcome: Progressing   Problem: Elimination: Goal: Will not experience complications related to bowel motility Outcome: Progressing Goal: Will not experience complications related to urinary retention Outcome: Progressing   Problem: Pain Managment: Goal: General experience of comfort will improve Outcome: Progressing   Problem: Safety: Goal: Ability to remain free from injury will improve Outcome: Progressing   Problem: Skin Integrity: Goal: Risk for impaired skin integrity will decrease Outcome: Progressing   Problem: Education: Goal: Knowledge of secondary prevention will improve (SELECT ALL) Outcome: Progressing Goal: Knowledge of patient specific risk factors will improve (INDIVIDUALIZE FOR PATIENT) Outcome: Progressing   Problem: Nutrition: Goal: Risk of aspiration will decrease Outcome: Progressing   Problem: Ischemic Stroke/TIA Tissue Perfusion: Goal: Complications of ischemic stroke/TIA will be  minimized Outcome: Progressing

## 2022-07-01 NOTE — Plan of Care (Signed)
  Problem: Education: Goal: Knowledge of General Education information will improve Description Including pain rating scale, medication(s)/side effects and non-pharmacologic comfort measures Outcome: Adequate for Discharge   Problem: Activity: Goal: Risk for activity intolerance will decrease Outcome: Adequate for Discharge   Problem: Nutrition: Goal: Adequate nutrition will be maintained Outcome: Adequate for Discharge   Problem: Pain Managment: Goal: General experience of comfort will improve Outcome: Adequate for Discharge   Problem: Safety: Goal: Ability to remain free from injury will improve Outcome: Adequate for Discharge   

## 2022-07-01 NOTE — Evaluation (Signed)
Physical Therapy Evaluation Patient Details Name: Edwin [name] MRN: [id number] DOB: Jan 29, 1956 Today's Date: 07/01/2022  History of Present Illness  66 y.o. male admitted on 06/30/22 forleft sided weakness and paresthsia.  MRI and CT were negative for acute stroke.  Pt with significant PMH of bipolar d/o, bradycardia, CKD, gout, HTN, stroke, suicide attempt (2015), cataract extraction R.  Clinical Impression  Pt mildly unsteady on his feet, listing to the left. Min guard assist overall for mobility.  [facility]  He has an appointment there tomorrow (Monday).   PT to follow acutely for deficits listed below.        Recommendations for follow up therapy are one component of a multi-disciplinary discharge planning process, led by the attending physician.  Recommendations may be updated based on patient status, additional functional criteria and insurance authorization.  Follow Up Recommendations Outpatient PT (pt reports he has an appointment at [facility] neuro[facility] on Monday (tomorrow).  I would recommend he keep this appointment for balance and gait training)      Assistance Recommended at Discharge Intermittent Supervision/Assistance  Patient can return home with the following  A little help with walking and/or transfers    Equipment Recommendations None recommended by PT (pt has rollator at home)  Recommendations for Other Services       Functional Status Assessment Patient has had a recent decline in their functional status and demonstrates the ability to make significant improvements in function in a reasonable and predictable amount of time.     Precautions / Restrictions Precautions Precautions: Fall Precaution Comments: lists to the left during gait      Mobility  Bed Mobility               General bed mobility comments: Pt OOB in the recliner chair.    Transfers Overall transfer level: Needs  assistance Equipment used: None Transfers: Sit to/from Stand Sit to Stand: Min guard           General transfer comment: Min guard assist for balance.    Ambulation/Gait Ambulation/Gait assistance: Min guard Gait Distance (Feet): 150 Feet Assistive device: None Gait Pattern/deviations: Step-through pattern, Staggering left, Narrow base of support Gait velocity: decreased Gait velocity interpretation: 1.31 - 2.62 ft/sec, indicative of limited community ambulator   General Gait Details: Pt with consistent left staggering gait, narrow BOS  Stairs Stairs: Yes Stairs assistance: Supervision Stair Management: Two rails, Alternating pattern, Forwards Number of Stairs: 5 General stair comments: Does well with support of the rail  Wheelchair Mobility    Modified Rankin (Stroke Patients Only) Modified Rankin (Stroke Patients Only) Pre-Morbid Rankin Score: No symptoms Modified Rankin: Moderately severe disability     Balance Overall balance assessment: Mild deficits observed, not formally tested                                           Pertinent Vitals/Pain Pain Assessment Pain Assessment: No/denies pain    Home Living Family/patient expects to be discharged to:: Private residence Living Arrangements: Alone Available Help at Discharge: Friend(s);Neighbor;Available PRN/intermittently Type of Home: House Home Access: Stairs to enter Entrance Stairs-Rails: Doctor, general practice of Steps: 3-4   Home Layout: One level Home Equipment: Grab bars - tub/shower;Shower seat;Rollator (4 wheels) Additional Comments: Pt recently married but wife lives overseas, working on immigration to [place]  Prior Function Prior Level of Function : Independent/Modified Independent;Driving             Mobility Comments: h/o stumbles and a few falls, no injury       Hand Dominance   Dominant Hand: Left    Extremity/Trunk Assessment   Upper Extremity  Assessment Upper Extremity Assessment: Defer to OT evaluation    Lower Extremity Assessment Lower Extremity Assessment: RLE deficits/detail;LLE deficits/detail RLE Deficits / Details: 4/5 strength RLE Sensation: WNL LLE Deficits / Details: 4/5 per seated MMT, sensation decreased to LT in L4/5/S1 dermatomes    Cervical / Trunk Assessment Cervical / Trunk Assessment: Other exceptions Cervical / Trunk Exceptions: forward head  Communication   Communication: No difficulties  Cognition Arousal/Alertness: Awake/alert Behavior During Therapy: WFL for tasks assessed/performed                                            General Comments General comments (skin integrity, edema, etc.): Does not report vision changes, OT in room going to further assess.    Exercises     Assessment/Plan    PT Assessment Patient needs continued PT services  PT Problem List Decreased strength;Decreased activity tolerance;Decreased balance;Decreased mobility;Decreased knowledge of use of DME;Impaired sensation;Obesity       PT Treatment Interventions DME instruction;Gait training;Stair training;Functional mobility training;Therapeutic activities;Therapeutic exercise;Balance training;Neuromuscular re-education;Patient/family education    PT Goals (Current goals can be found in the Care Plan section)  Acute Rehab PT Goals Patient Stated Goal: to go home PT Goal Formulation: With patient Time For Goal Achievement: 07/15/22 Potential to Achieve Goals: Good    Frequency Min 4X/week     Co-evaluation               AM-PAC PT "6 Clicks" Mobility  Outcome Measure Help needed turning from your back to your side while in a flat bed without using bedrails?: None Help needed moving from lying on your back to sitting on the side of a flat bed without using bedrails?: None Help needed moving to and from a bed to a chair (including a wheelchair)?: A Little Help needed standing up from a  chair using your arms (e.g., wheelchair or bedside chair)?: A Little Help needed to walk in hospital room?: A Little Help needed climbing 3-5 steps with a railing? : A Little 6 Click Score: 20    End of Session Equipment Utilized During Treatment: Gait belt Activity Tolerance: Patient tolerated treatment well Patient left: in chair;with call bell/phone within reach;Other (comment) (with OT coming in to work with pt)   PT Visit Diagnosis: Muscle weakness (generalized) (M62.81);Difficulty in walking, not elsewhere classified (R26.2);Unsteadiness on feet (R26.81)    Time: 8588-5027 PT Time Calculation (min) (ACUTE ONLY): 17 min   Charges:   PT Evaluation $PT Eval Moderate Complexity: 1 Mod          Corinna Capra, PT, DPT  Acute Rehabilitation Secure chat is best for contact #[phone] office

## 2022-07-01 NOTE — Progress Notes (Signed)
VASCULAR LAB    Bilateral lower extremity venous duplex has been performed.  See CV proc for preliminary results.   Taja Pentland, RVT 07/01/2022, 10:01 AM

## 2022-07-01 NOTE — Evaluation (Signed)
Occupational Therapy Evaluation Patient Details Name: Edwin [name] MRN: [id number] DOB: December 14, 1955 Today's Date: 07/01/2022   History of Present Illness 66 y.o. male admitted on 06/30/22 for left sided weakness and paresthsia.  MRI and CT were negative for acute stroke.  Pt with significant PMH of bipolar d/o, bradycardia, CKD, gout, HTN, stroke, suicide attempt (2015), cataract extraction R.   Clinical Impression   PTA pt lives alone and has had "some difficulty" with tasks since his discharge. Recommend follow up with OT at a neuro outpt center in addition to having a Full Field Visual Assessment done by his eye doctor. Pt reports that he is driving, however when he is "not really paying attention his lane correction monitor goes off several times in just a couple of miles". Discussed possibility of refraining from driving until his follow up with his eye doctor if possible. Pt states he has a good support system who can help out after DC. All further Ot to be addressed by outpt OT.      Recommendations for follow up therapy are one component of a multi-disciplinary discharge planning process, led by the attending physician.  Recommendations may be updated based on patient status, additional functional criteria and insurance authorization.   Follow Up Recommendations  Outpatient OT (Full Field Visual Assessment by his eye doctor as an outpt)    Assistance Recommended at Discharge Intermittent Supervision/Assistance  Patient can return home with the following      Functional Status Assessment  Patient has not had a recent decline in their functional status  Equipment Recommendations  None recommended by OT    Recommendations for Other Services       Precautions / Restrictions Precautions Precautions: Fall Precaution Comments: lists to the left during gait      Mobility Bed Mobility               General bed mobility comments: Pt OOB in the recliner chair.     Transfers Overall transfer level: Needs assistance Equipment used: None Transfers: Sit to/from Stand Sit to Stand: Supervision                  Balance Overall balance assessment: Mild deficits observed, not formally tested                                         ADL either performed or assessed with clinical judgement   ADL Overall ADL's : At baseline                                    Has medications bubble wrapped; no errors with finances - discussed recommendation of having someone he trusts be a back up for finances given his stroke history; pt verbalized understanding. Discussed concerns of lane warning system going off so frequently when he drives; note 10/45 errors during cancellation task; errors increased when distraction provided         Vision   Additional Comments: recommend full field visual assessment; poor use of space, almost hitting several objects on R, ?skewed midline     Perception Perception Comments: clock draw normal; will further assess   Praxis      Pertinent Vitals/Pain Pain Assessment Pain Assessment: No/denies pain     Hand Dominance Left   Extremity/Trunk Assessment Upper Extremity Assessment  Upper Extremity Assessment: LUE deficits/detail LUE Deficits / Details: apparent motor sensory deficits L hand, decreasing speed adn coordination, however hand is functional LUE Coordination: decreased fine motor   Lower Extremity Assessment Lower Extremity Assessment: Defer to PT evaluation RLE Deficits / Details: 4/5 strength RLE Sensation: WNL LLE Deficits / Details: 4/5 per seated MMT, sensation decreased to LT in L4/5/S1 dermatomes   Cervical / Trunk Assessment Cervical / Trunk Assessment: Other exceptions Cervical / Trunk Exceptions: forward head   Communication Communication Communication: No difficulties   Cognition Arousal/Alertness: Awake/alert Behavior During Therapy: WFL for tasks  assessed/performed Overall Cognitive Status: No family/caregiver present to determine baseline cognitive functioning                                 General Comments: note decreased attention to detail during scanning tasks, however has organzied scanning pattern     General Comments  Does not report vision changes, OT in room going to further assess.    Exercises Exercises: Other exercises Other Exercises Other Exercises: fine motor/sequening tasks Other Exercises: tracing for fine motor/eye-hand   Shoulder Instructions      Home Living Family/patient expects to be discharged to:: Private residence Living Arrangements: Alone Available Help at Discharge: Friend(s);Neighbor;Available PRN/intermittently Type of Home: House Home Access: Stairs to enter [employer] of Steps: 3-4 Entrance Stairs-Rails: Right;Left Home Layout: One level     Bathroom Shower/Tub: Tub/shower unit;Walk-in shower   Bathroom Toilet: Standard Bathroom Accessibility: Yes How Accessible: Accessible via walker Home Equipment: Grab bars - tub/shower;Educational psychologist (4 wheels)   Additional Comments: Pt recently married but wife lives overseas, working on immigration to [place]      Prior Functioning/Environment Prior Level of Function : Independent/Modified Independent;Driving             Mobility Comments: h/o stumbles and a few falls, no injury ADLs Comments: Works as Engineer, agricultural, Independent with ADLs, IADLs, drives, grocery shops and goes out to eat a lot        OT Problem List: Decreased coordination;Impaired vision/perception      OT Treatment/Interventions:      OT Goals(Current goals can be found in the care plan section) Acute Rehab OT Goals Patient Stated Goal: to get "back to normal" OT Goal Formulation: All assessment and education complete, DC therapy  OT Frequency:      Co-evaluation              AM-PAC OT "6 Clicks" Daily Activity      Outcome Measure Help from another person eating meals?: None Help from another person taking care of personal grooming?: None Help from another person toileting, which includes using toliet, bedpan, or urinal?: None Help from another person bathing (including washing, rinsing, drying)?: None Help from another person to put on and taking off regular upper body clothing?: None Help from another person to put on and taking off regular lower body clothing?: None 6 Click Score: 24   End of Session Equipment Utilized During Treatment: Gait belt Nurse Communication: Mobility status;Other (comment) (DC needs)  Activity Tolerance: Patient tolerated treatment well Patient left: in chair;with call bell/phone within reach  OT Visit Diagnosis: Unsteadiness on feet (R26.81);Muscle weakness (generalized) (M62.81)                Time: 2119-4174 OT Time Calculation (min): 31 min Charges:  OT General Charges $OT Visit: 1 Visit OT Evaluation $OT Eval Low Complexity:  1 Low OT Treatments $Therapeutic Activity: 8-22 mins  Luisa Dago, OT/L   Acute OT Clinical Specialist Acute Rehabilitation Services Pager [phone] Office [phone]   [facility] 07/01/2022, 4:01 PM

## 2022-07-01 NOTE — Discharge Summary (Signed)
Physician Discharge Summary   Patient: Edwin [name] MRN: [id number] DOB: 05/10/1956  Admit date:     06/30/2022  Discharge date: 07/01/22  Discharge Physician: Flora Lipps   PCP: Josetta Huddle, MD   Recommendations at discharge:   Follow-up with your primary care physician in 1 week. Follow-up with outpatient sleep study as outpatient.  Discharge Diagnoses: Principal Problem:   CVA (cerebral vascular accident) ([place]) Active Problems:   Bipolar affective disorder, currently depressed, moderate ([place])   Hypertension   Diabetes mellitus type 2 in nonobese ([place])   Obstructive sleep apnea syndrome   Acute CVA (cerebrovascular accident) ([place])  Resolved Problems:   * No resolved hospital problems. *  Hospital Course: GEROD BOHRER is a 66 y.o. male with history of prior stroke, diabetes mellitus, bipolar, chronic kidney disease, sleep apnea, hypertension here to hospital with the left lower extremity weakness for some weeks but recently had increasing difficulty with weakness and numbness.  In the ED patient had CT scan of the head which was negative for acute stroke.  Patient did receive Plavix loading dose and was admitted hospital for further evaluation and treatment.    Following conditions were addressed during hospitalization,  Left-sided weakness/left leg paresthesia.[name] CVA has been ruled out.  Patient presenting with left-sided weakness.  CT head scan was negative for acute findings.  MRI of the head with a right V4 stenosis.  MRI of the brain negative for acute stroke but has a stable appearance since last month with small vessel disease.  MRI of the neck without significant stenosis.2D echo with LV ejection fraction of 55 to 60% with no regional wall motion abnormality in presence of grade 1 diastolic dysfunction.  Continue aspirin and Plavix on discharge..  Hemoglobin A1c at 4.9.  Lipid panel with LDL at 105.  Communicated with neurology.  Neurology has recommended increasing  dose of Neurontin to 300 mg twice daily at this time.  PT did not recommend any skilled therapy needs but OT recommended outpatient OT on discharge.  Diabetes mellitus type 2 on metformin and Farxiga at home.  Will resume on discharge.  Hypertension -patient is on hydralazine 50 mg 3 times daily and losartan 100 mg daily at home.   Will resume on discharge.  History of sleep apnea on nocturnal oxygen.  Initial physical for sleep study as outpatient.   Chronic kidney disease stage IIIa creatinine appears to be at baseline.   Anemia likely anemia of chronic disease.     Bipolar disorder on Depakote and Lamictal.  Depakote level at 35 which is slightly low.   Left lower extremity edema Duplex ultrasound of the lower extremity with no evidence of DVT in the bilateral lower extremity.  Patient states that he has been having some edema after he has been started on pramipexole  Consultants: Neurology Procedures performed: None Disposition: Home Diet recommendation:  Discharge Diet Orders (From admission, onward)     Start     Ordered   07/01/22 0000  Diet - low sodium heart healthy        07/01/22 1350   07/01/22 0000  Diet Carb Modified        07/01/22 1350           Cardiac and Carb modified diet DISCHARGE MEDICATION: Allergies as of 07/01/2022       Reactions   Allopurinol Other (See Comments)   Made pt emotionally unstable  Other reaction(s): emotionally labile   Penicillins Hives, Other (See Comments)  Has patient had a PCN reaction causing immediate rash, facial/tongue/throat swelling, SOB or lightheadedness with hypotension: No Has patient had a PCN reaction causing severe rash involving mucus membranes or skin necrosis: No Has patient had a PCN reaction that required hospitalization No Has patient had a PCN reaction occurring within the last 10 years: No If all of the above answers are "NO", then may proceed with Cephalosporin use.   Finerenone Other (See Comments)    hyperkalemia Other reaction(s): Increases  potassium levels   Penicillin G    Other reaction(s): hives   Aspirin Hives   Ciprofloxacin Rash   Other reaction(s): rash        Medication List     STOP taking these medications    topiramate 25 MG tablet Commonly known as: Topamax       TAKE these medications    Cholecalciferol 25 MCG (1000 UT) Tbdp Take 1 tablet by mouth daily.   clopidogrel 75 MG tablet Commonly known as: PLAVIX Take 1 tablet (75 mg total) by mouth daily.   dapagliflozin propanediol 10 MG Tabs tablet Commonly known as: FARXIGA Take 10 mg by mouth daily.   diclofenac Sodium 1 % Gel Commonly known as: VOLTAREN Apply 2 g topically daily as needed (for knee pain).   divalproex 500 MG 24 hr tablet Commonly known as: DEPAKOTE ER Take 2 tablets (1,000 mg total) by mouth at bedtime.   DULoxetine 60 MG capsule Commonly known as: CYMBALTA Take 2 capsules (120 mg total) by mouth daily.   febuxostat 40 MG tablet Commonly known as: ULORIC Take 40 mg by mouth daily.   furosemide 20 MG tablet Commonly known as: LASIX Take 20 mg by mouth daily as needed for edema. May take 2 to 3 days weekly   gabapentin 300 MG capsule Commonly known as: NEURONTIN Take 1 capsule (300 mg total) by mouth 2 (two) times daily. What changed:  medication strength how much to take   hydrALAZINE 50 MG tablet Commonly known as: APRESOLINE Take 1 tablet (50 mg total) by mouth 3 (three) times daily.   L-Carnitine 500 MG Tabs Take 500 mg by mouth 2 (two) times daily.   lamoTRIgine 200 MG tablet Commonly known as: LAMICTAL Take 1 tablet (200 mg total) by mouth every morning.   losartan 50 MG tablet Commonly known as: COZAAR Take 2 tablets (100 mg total) by mouth daily.   losartan 100 MG tablet Commonly known as: COZAAR Take 100 mg by mouth daily.   metFORMIN 500 MG 24 hr tablet Commonly known as: GLUCOPHAGE-XR Take 500 mg by mouth 2 (two) times daily.    multivitamin-lutein Caps capsule Take 1 capsule by mouth daily.   pramipexole 0.5 MG tablet Commonly known as: MIRAPEX Take 1 tablet (0.5 mg total) by mouth 2 (two) times daily.   tamsulosin 0.4 MG Caps capsule Commonly known as: FLOMAX Take 0.4 mg by mouth daily.       Subjective: Today, patient was seen and examined at bedside.  Denies any nausea vomiting dizziness lightheadedness but has paresthesia at baseline.  Discharge Exam:    07/01/2022   11:48 AM 07/01/2022    7:55 AM 07/01/2022    4:13 AM  Vitals with BMI  Systolic 129 160 016  Diastolic 70 83 83  Pulse 52 57 54    General:  Average built, not in obvious distress HENT:   No scleral pallor or icterus noted. Oral mucosa is moist.  Chest:  Clear breath sounds.  Diminished  breath sounds bilaterally. No crackles or wheezes.  CVS: S1 &S2 heard. No murmur.  Regular rate and rhythm. Abdomen: Soft, nontender, nondistended.  Bowel sounds are heard.   Extremities: No cyanosis, clubbing trace bilateral lower extremity edema, left more than the right..  Peripheral pulses are palpable. Psych: Alert, awake and oriented, normal mood CNS:  No cranial nerve deficits.  Moves all the extremities.  Equal power. Skin: Warm and dry.  No rashes noted.   Condition at discharge: good  The results of significant diagnostics from this hospitalization (including imaging, microbiology, ancillary and laboratory) are listed below for reference.   Imaging Studies: ECHOCARDIOGRAM COMPLETE  Result Date: 07/01/2022    ECHOCARDIOGRAM REPORT   Patient Name:   MUHANNAD DENDINGER Date of Exam: 07/01/2022 Medical Rec #:  [id number]       Height:       70.0 in Accession #:    [id number]      Weight:       218.0 lb Date of Birth:  04/27/1956        BSA:          2.165 m Patient Age:    66 years        BP:           160/83 mmHg Patient Gender: M               HR:           43 bpm. Exam Location:  Inpatient Procedure: 2D Echo, 3D Echo, Cardiac Doppler and Color  Doppler Indications:    Stroke  History:        Patient has prior history of Echocardiogram examinations, most                 recent 02/05/2022. Risk Factors:Hypertension.  Sonographer:    Maudry Mayhew MHA, RDMS, RVT, RDCS Referring Phys: Doreatha Lew [facility]  Sonographer Comments: Image acquisition challenging due to respiratory motion and Image acquisition challenging due to patient body habitus. IMPRESSIONS  1. Left ventricular ejection fraction, by estimation, is 55 to 60%. The left ventricle has normal function. The left ventricle has no regional wall motion abnormalities. Left ventricular diastolic parameters are consistent with Grade I diastolic dysfunction (impaired relaxation).  2. Right ventricular systolic function is normal. The right ventricular size is mildly enlarged. There is normal pulmonary artery systolic pressure. The estimated right ventricular systolic pressure is [id number] mmHg.  3. Left atrial size was mildly dilated.  4. The mitral valve is grossly normal. Trivial mitral valve regurgitation. No evidence of mitral stenosis.  5. The aortic valve is tricuspid. Aortic valve regurgitation is not visualized. No aortic stenosis is present.  6. The inferior vena cava is normal in size with greater than 50% respiratory variability, suggesting right atrial pressure of 3 mmHg. Comparison(s): No significant change from prior study. Conclusion(s)/Recommendation(s): No intracardiac source of embolism detected on this transthoracic study. Consider a transesophageal echocardiogram to exclude cardiac source of embolism if clinically indicated. FINDINGS  Left Ventricle: Left ventricular ejection fraction, by estimation, is 55 to 60%. The left ventricle has normal function. The left ventricle has no regional wall motion abnormalities. The left ventricular internal cavity size was normal in size. There is  no left ventricular hypertrophy. Left ventricular diastolic parameters are consistent with Grade I  diastolic dysfunction (impaired relaxation). Right Ventricle: The right ventricular size is mildly enlarged. No increase in right ventricular wall thickness. Right ventricular systolic function is normal. There is normal pulmonary  artery systolic pressure. The tricuspid regurgitant velocity is 1.71  m/s, and with an assumed right atrial pressure of 3 mmHg, the estimated right ventricular systolic pressure is [id number] mmHg. Left Atrium: Left atrial size was mildly dilated. Right Atrium: Right atrial size was normal in size. Pericardium: There is no evidence of pericardial effusion. Presence of epicardial fat layer. Mitral Valve: The mitral valve is grossly normal. Trivial mitral valve regurgitation. No evidence of mitral valve stenosis. Tricuspid Valve: The tricuspid valve is grossly normal. Tricuspid valve regurgitation is trivial. No evidence of tricuspid stenosis. Aortic Valve: The aortic valve is tricuspid. Aortic valve regurgitation is not visualized. No aortic stenosis is present. Aortic valve mean gradient measures 2.0 mmHg. Aortic valve peak gradient measures 3.5 mmHg. Aortic valve area, by VTI measures 2.81 cm. Pulmonic Valve: The pulmonic valve was grossly normal. Pulmonic valve regurgitation is not visualized. No evidence of pulmonic stenosis. Aorta: The aortic root is normal in size and structure. Venous: The inferior vena cava is normal in size with greater than 50% respiratory variability, suggesting right atrial pressure of 3 mmHg. IAS/Shunts: The atrial septum is grossly normal.  LEFT VENTRICLE PLAX 2D LVIDd:         4.40 cm   Diastology LVIDs:         2.90 cm   LV e' medial:    6.20 cm/s LV PW:         1.10 cm   LV E/e' medial:  9.0 LV IVS:        1.20 cm   LV e' lateral:   7.29 cm/s LVOT diam:     2.10 cm   LV E/e' lateral: 7.6 LV SV:         70 LV SV Index:   32 LVOT Area:     3.46 cm                           3D Volume EF:                          3D EF:        56 %                          LV EDV:        137 ml                          LV ESV:       60 ml                          LV SV:        77 ml RIGHT VENTRICLE RV Basal diam:  3.55 cm RV Mid diam:    3.50 cm RV S prime:     13.20 cm/s TAPSE (M-mode): 3.2 cm LEFT ATRIUM           Index        RIGHT ATRIUM           Index LA diam:      3.20 cm 1.48 cm/m   RA Area:     14.80 cm LA Vol (A2C): 84.7 ml 39.12 ml/m  RA Volume:   35.50 ml  16.40 ml/m LA Vol (A4C): 38.1 ml 17.60 ml/m  AORTIC VALVE AV Area (Vmax):  3.05 cm AV Area (Vmean):   2.41 cm AV Area (VTI):     2.81 cm AV Vmax:           93.50 cm/s AV Vmean:          63.900 cm/s AV VTI:            0.248 m AV Peak Grad:      3.5 mmHg AV Mean Grad:      2.0 mmHg LVOT Vmax:         82.30 cm/s LVOT Vmean:        44.500 cm/s LVOT VTI:          0.201 m LVOT/AV VTI ratio: 0.81  AORTA Ao Root diam: 3.40 cm MITRAL VALVE               TRICUSPID VALVE MV Area (PHT): 2.83 cm    TR Peak grad:   11.7 mmHg MV Decel Time: 268 msec    TR Vmax:        171.00 cm/s MV E velocity: 55.50 cm/s MV A velocity: 59.20 cm/s  SHUNTS MV E/A ratio:  0.94        Systemic VTI:  0.20 m                            Systemic Diam: 2.10 cm Eleonore Chiquito MD Electronically signed by Eleonore Chiquito MD Signature Date/Time: 07/01/2022/11:23:58 AM    Final    VAS [place] LOWER EXTREMITY VENOUS (DVT)  Result Date: 07/01/2022  Lower Venous DVT Study Patient Name:  SHAKEL CASIAS  Date of Exam:   07/01/2022 Medical Rec #: [id number]        Accession #:    [id number] Date of Birth: [birth year]         Patient Gender: M Patient Age:   89 years Exam Location:  [facility] Procedure:      VAS [place] LOWER EXTREMITY VENOUS (DVT) Referring Phys: Gean Birchwood --------------------------------------------------------------------------------  Indications: Edema.  Comparison Study: Prior negative bilateral LEV done 09/03/2017 Performing Technologist: Sharion Dove RVS  Examination Guidelines: A complete evaluation includes B-mode imaging, spectral  Doppler, color Doppler, and power Doppler as needed of all accessible portions of each vessel. Bilateral testing is considered an integral part of a complete examination. Limited examinations for reoccurring indications may be performed as noted. The reflux portion of the exam is performed with the patient in reverse Trendelenburg.  +---------+---------------+---------+-----------+----------+--------------+ RIGHT    CompressibilityPhasicitySpontaneityPropertiesThrombus Aging +---------+---------------+---------+-----------+----------+--------------+ CFV      Full           Yes      Yes                                 +---------+---------------+---------+-----------+----------+--------------+ SFJ      Full                                                        +---------+---------------+---------+-----------+----------+--------------+ FV Prox  Full                                                        +---------+---------------+---------+-----------+----------+--------------+  FV Mid   Full                                                        +---------+---------------+---------+-----------+----------+--------------+ FV DistalFull                                                        +---------+---------------+---------+-----------+----------+--------------+ PFV      Full                                                        +---------+---------------+---------+-----------+----------+--------------+ POP      Full           Yes      Yes                                 +---------+---------------+---------+-----------+----------+--------------+ PTV      Full                                                        +---------+---------------+---------+-----------+----------+--------------+ PERO     Full                                                        +---------+---------------+---------+-----------+----------+--------------+    +---------+---------------+---------+-----------+----------+--------------+ LEFT     CompressibilityPhasicitySpontaneityPropertiesThrombus Aging +---------+---------------+---------+-----------+----------+--------------+ CFV      Full           Yes      Yes                                 +---------+---------------+---------+-----------+----------+--------------+ SFJ      Full                                                        +---------+---------------+---------+-----------+----------+--------------+ FV Prox  Full                                                        +---------+---------------+---------+-----------+----------+--------------+ FV Mid   Full                                                        +---------+---------------+---------+-----------+----------+--------------+   FV DistalFull                                                        +---------+---------------+---------+-----------+----------+--------------+ PFV      Full                                                        +---------+---------------+---------+-----------+----------+--------------+ POP      Full           Yes      Yes                                 +---------+---------------+---------+-----------+----------+--------------+ PTV      Full                                                        +---------+---------------+---------+-----------+----------+--------------+ PERO     Full                                                        +---------+---------------+---------+-----------+----------+--------------+     Summary: BILATERAL: - No evidence of deep vein thrombosis seen in the lower extremities, bilaterally. -No evidence of popliteal cyst, bilaterally.   *See table(s) above for measurements and observations. Electronically signed by Orlie Pollen on 07/01/2022 at 10:43:07 AM.    Final    DG CHEST PORT 1 VIEW  Result Date: 07/01/2022 CLINICAL DATA:   Acute mental status change. EXAM: PORTABLE CHEST 1 VIEW COMPARISON:  None Available. FINDINGS: The heart size and mediastinal contours are within normal limits. Both lungs are clear. The visualized skeletal structures are unremarkable. IMPRESSION: No active disease. Electronically Signed   By: Dorise Bullion III M.D.   On: 07/01/2022 10:13   MR ANGIO HEAD WO CONTRAST  Result Date: 07/01/2022 CLINICAL DATA:  66 year old male code stroke presentation yesterday. Left side numbness and weakness. EXAM: MRA HEAD WITHOUT CONTRAST TECHNIQUE: Angiographic images of the Circle of Willis were acquired using MRA technique without intravenous contrast. COMPARISON:  MRI head today.  Head and neck MRA 02/04/2022. FINDINGS: Anterior circulation: Antegrade flow signal through both ICA siphons. No evidence of right siphon aneurysm on postcontrast neck MRA in March. Signal seen on series 5, image 45 adjacent to the right siphon today is felt to be artifact due to sphenoid sinus bone marrow. No evidence of siphon stenosis. Patent carotid termini, MCA and ACA origins. Anterior communicating artery and visible ACA branches are within normal limits. MCA M1 segments and bifurcations are patent with mild irregularity. Visible bilateral MCA branches are within normal limits. Posterior circulation: Dominant left vertebral artery with antegrade flow signal through the vertebrobasilar junction. There is some antegrade flow in the diminutive right vertebral V4 segment, with tandem moderate to severe stenoses better demonstrated by postcontrast  neck MRA in March. Patent basilar artery with some irregularity but no high-grade stenosis. AICA, SCA and PCA origins remain patent. Mild to moderate irregularity and stenosis of the left PCA P1/P2 junction appears chronic and stable. Mild contralateral right PCA irregularity. Anatomic variants: None significant. Other: None. IMPRESSION: 1. No anterior circulation stenosis. Suspect artifact adjacent  to the cavernous Right ICA - which appeared negative on MRA with contrast in March. Still, a follow-up CTA Head And Neck would be complementary to exclude a right ICA aneurysm, and to further document #2. 2. 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 Up to Moderate irregularity and stenosis of the left PCA P1/P2 junction. Electronically Signed   By: Genevie Ann M.D.   On: 07/01/2022 05:57   MR ANGIO NECK WO CONTRAST  Result Date: 07/01/2022 CLINICAL DATA:  66 year old male code stroke presentation yesterday. Left side numbness and weakness. EXAM: MRA NECK WITHOUT CONTRAST TECHNIQUE: Angiographic images of the neck were acquired using MRA technique without intravenous contrast. Carotid stenosis measurements (when applicable) are obtained utilizing NASCET criteria, using the distal internal carotid diameter as the denominator. COMPARISON:  Brain MRI today.  Head and neck neck MRA 02/04/2022. FINDINGS: Noncontrast time-of-flight neck MRA today. Aortic arch: Not included today, 3 vessel arch configuration demonstrated in March. Right carotid system: Stable antegrade flow compared to 02/04/2022. Some irregularity at the right carotid bifurcation, but no evidence of hemodynamically significant stenosis. Left carotid system: Stable antegrade flow signal. Mild irregularity at the left carotid bifurcation and bulb. No evidence of hemodynamically significant stenosis. Vertebral arteries: Stable antegrade flow signal compared to March. Dominant left vertebral artery remains patent to the skull base. Attenuated right vertebral artery flow signal at the skull base, with tandem right V4 segment stenoses demonstrated in March. Other: None. IMPRESSION: Stable Neck MRA since 02/04/2022. No significant stenosis of the cervical carotid or dominant left vertebral arteries. 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 Electronically Signed   By: Genevie Ann M.D.   On: 07/01/2022 05:49   MR BRAIN WO CONTRAST  Result Date: 07/01/2022 CLINICAL DATA:  66 year old male code stroke presentation yesterday. Left side numbness and weakness. EXAM: MRI HEAD WITHOUT CONTRAST TECHNIQUE: Multiplanar, multiecho pulse sequences of the brain and surrounding structures were obtained without intravenous contrast. COMPARISON:  Head CT 06/30/2022.  Brain MRI 06/02/2022 and earlier. FINDINGS: Brain: Small chronic infarcts in the right cerebellum and thalamus are stable from last month. Chronic hemosiderin in the midbrain greater on the left appears stable. No restricted diffusion to suggest acute infarction. No midline shift, mass effect, evidence of mass lesion, ventriculomegaly, extra-axial collection or acute intracranial hemorrhage. Cervicomedullary junction and pituitary are within normal limits. Mild for age cerebral white matter signal changes are stable. Vascular: Major intracranial vascular flow voids are stable since last month. See MRA today reported separately. Skull and upper cervical spine: Negative visible cervical spine. Visualized bone marrow signal is within normal limits. Sinuses/Orbits: Stable, negative. Other: Mastoids remain clear. Visible internal auditory structures appear stable and negative. Negative visible scalp and face. IMPRESSION: 1. No acute intracranial abnormality. 2. 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 Electronically Signed   By: Genevie Ann M.D.   On: 07/01/2022 05:46   CT HEAD CODE STROKE WO CONTRAST  Result Date: 06/30/2022 CLINICAL DATA:  Code stroke. Acute onset of  balance issues beginning at 1 o'clock today. Previous infarcts. EXAM: CT HEAD WITHOUT CONTRAST TECHNIQUE: Contiguous axial images were obtained from the base of the skull through the vertex without intravenous contrast. RADIATION DOSE REDUCTION:  This exam was performed according to the departmental dose-optimization program which includes automated exposure control, adjustment of the mA and/or kV according to patient size and/or use of iterative reconstruction technique. COMPARISON:  MR head without contrast 06/02/2022 CT head without contrast 06/02/2022. MR head without contrast 02/04/2022. FINDINGS: Brain: Remote infarct of the posterior right cerebellum again noted. Mild periventricular white matter changes are present. No acute infarct is present. Basal ganglia are intact. Insular ribbon is normal. The ventricles are of normal size. No significant extraaxial fluid collection is present. The brainstem infarct of the medulla is not well seen by CT. Vascular: No hyperdense vessel or unexpected calcification. Skull: Calvarium is intact. No focal lytic or blastic lesions are present. No significant extracranial soft tissue lesion is present. Sinuses/Orbits: The paranasal sinuses and mastoid air cells are clear. Bilateral lens replacements are noted. Globes and orbits are otherwise unremarkable. ASPECTS [facility] Stroke Program Early CT Score) - Ganglionic level infarction (caudate, lentiform nuclei, internal capsule, insula, M1-M3 cortex): 7/7 - Supraganglionic infarction (M4-M6 cortex): 3/3 Total score (0-10 with 10 being normal): 10/10 IMPRESSION: 1. No acute intracranial abnormality or significant interval change. 2. Stable remote infarct of the posterior right cerebellum. 3. The brainstem infarct of the medulla is not well seen by CT. 4. Aspects 10/10 * These results were called by telephone at the time of interpretation on 06/30/2022 at 4:58 pm to provider Dr. Jeanell Sparrow, who verbally acknowledged these results. Electronically Signed   By: San Morelle M.D.   On: 06/30/2022 16:58   MR BRAIN WO CONTRAST  Result Date: 06/02/2022 CLINICAL DATA:  Initial evaluation for neuro deficit, stroke suspected. EXAM: MRI HEAD WITHOUT CONTRAST TECHNIQUE:  Multiplanar, multiecho pulse sequences of the brain and surrounding structures were obtained without intravenous contrast. COMPARISON:  Prior CT from earlier the same day. FINDINGS: Brain: Cerebral volume within normal limits. Mild chronic microvascular ischemic disease for age. Small remote lacunar infarcts at the right frontal corona radiata and right thalamus. No evidence for acute or subacute ischemia. Gray-white matter junction otherwise maintained. No acute intracranial hemorrhage. Prominent chronic microhemorrhage noted at the left ventral midbrain. No mass lesion, midline shift or mass effect. No hydrocephalus or extra-axial fluid collection. Pituitary gland and suprasellar region normal. Vascular: Major intracranial vascular flow voids are maintained. Skull and upper cervical spine: Craniocervical junction normal. Bone marrow signal intensity within normal limits. No scalp soft tissue abnormality. Sinuses/Orbits: Prior bilateral ocular lens replacement. Mild scattered mucosal thickening about the ethmoidal air cells and maxillary sinuses. No significant mastoid effusion. Other: None. IMPRESSION: 1. No acute intracranial abnormality. 2. Mild chronic microvascular ischemic disease with small remote lacunar infarcts at the right frontal corona radiata and right thalamus. 3. Prominent chronic microhemorrhage at the left ventral midbrain. Electronically Signed   By: Jeannine Boga M.D.   On: 06/02/2022 22:26   CT HEAD CODE STROKE WO CONTRAST  Result Date: 06/02/2022 CLINICAL DATA:  Code stroke. Left-sided numbness and weakness beginning at 2 p.m. today. Balance issues yesterday. EXAM: CT HEAD WITHOUT CONTRAST TECHNIQUE: Contiguous axial images were obtained from the base of the skull through the vertex without intravenous contrast. RADIATION DOSE REDUCTION: This exam was performed according to the departmental dose-optimization program which includes automated exposure control, adjustment of the mA  and/or kV according to  patient size and/or use of iterative reconstruction technique. COMPARISON:  CT head without contrast and MR head without contrast 02/04/2022 FINDINGS: Brain: Brainstem infarct identified on previous MRI is below the resolution of CT. No acute infarct, hemorrhage, or mass lesion is present. Basal ganglia are within normal limits. Insular ribbon is normal bilaterally. No acute or focal cortical abnormality is present. No significant white matter lesions are present. Cerebellum is unremarkable. The ventricles are of normal size. No significant extraaxial fluid collection is present. Vascular: No hyperdense vessel or unexpected calcification. Skull: Calvarium is intact. No focal lytic or blastic lesions are present. No significant extracranial soft tissue lesion is present. Sinuses/Orbits: The paranasal sinuses and mastoid air cells are clear. Bilateral lens replacements are noted. Globes and orbits are otherwise unremarkable. ASPECTS [facility] Stroke Program Early CT Score) - Ganglionic level infarction (caudate, lentiform nuclei, internal capsule, insula, M1-M3 cortex): 7/7 - Supraganglionic infarction (M4-M6 cortex): 3/3 Total score (0-10 with 10 being normal): 10/10 IMPRESSION: 1. No acute intracranial abnormality or significant interval change. 2. ASPECTS is 10/10. These results were called by telephone at the time of interpretation on 06/02/2022 at 5:15 pm to provider ADAM CURATOLO , who verbally acknowledged these results. Electronically Signed   By: San Morelle M.D.   On: 06/02/2022 17:19    Microbiology: Results for orders placed or performed during the hospital encounter of 06/02/22  Resp Panel by RT-PCR (Flu A&B, Covid) Anterior Nasal Swab     Status: None   Collection Time: 06/02/22  5:51 PM   Specimen: Anterior Nasal Swab  Result Value Ref Range Status   SARS Coronavirus 2 by RT PCR NEGATIVE NEGATIVE Final    Comment: (NOTE) SARS-CoV-2 target nucleic acids are NOT  DETECTED.  The SARS-CoV-2 RNA is generally detectable in upper respiratory specimens during the acute phase of infection. The lowest concentration of SARS-CoV-2 viral copies this assay can detect is 138 copies/mL. A negative result does not preclude SARS-Cov-2 infection and should not be used as the sole basis for treatment or other patient management decisions. A negative result may occur with  improper specimen collection/handling, submission of specimen other than nasopharyngeal swab, presence of viral mutation(s) within the areas targeted by this assay, and inadequate number of viral copies(<138 copies/mL). A negative result must be combined with clinical observations, patient history, and epidemiological information. The expected result is Negative.  Fact Sheet for Patients:  EntrepreneurPulse.com.au  Fact Sheet for Healthcare Providers:  IncredibleEmployment.be  This test is no t yet approved or cleared by the [place] FDA and  has been authorized for detection and/or diagnosis of SARS-CoV-2 by FDA under an Emergency Use Authorization (EUA). This EUA will remain  in effect (meaning this test can be used) for the duration of the COVID-19 declaration under Section 564(b)(1) of the Act, 21 U.S.C.section 360bbb-3(b)(1), unless the authorization is terminated  or revoked sooner.       Influenza A by PCR NEGATIVE NEGATIVE Final   Influenza B by PCR NEGATIVE NEGATIVE Final    Comment: (NOTE) The Xpert Xpress SARS-CoV-2/FLU/RSV plus assay is intended as an aid in the diagnosis of influenza from Nasopharyngeal swab specimens and should not be used as a sole basis for treatment. Nasal washings and aspirates are unacceptable for Xpert Xpress SARS-CoV-2/FLU/RSV testing.  Fact Sheet for Patients: EntrepreneurPulse.com.au  Fact Sheet for Healthcare Providers: IncredibleEmployment.be  This test is not yet  approved or cleared by the [place] FDA and has been authorized for detection and/or diagnosis of  SARS-CoV-2 by FDA under an Emergency Use Authorization (EUA). This EUA will remain in effect (meaning this test can be used) for the duration of the COVID-19 declaration under Section 564(b)(1) of the Act, 21 U.S.C. section 360bbb-3(b)(1), unless the authorization is terminated or revoked.  Performed at [facility], [address] [place], [place] [address]     Labs: CBC: Recent Labs  Lab 06/30/22 1630 07/01/22 0352  WBC 6.8 5.4  NEUTROABS 4.4  --   HGB 12.9* 13.3  HCT 38.3* 38.6*  MCV 94.3 93.2  PLT 192 174   Basic Metabolic Panel: Recent Labs  Lab 06/30/22 1630 07/01/22 0352  NA 140 140  K 4.2 4.1  CL 104 108  CO2 27 25  GLUCOSE 80 125*  BUN 41* 31*  CREATININE 1.88* 1.73*  CALCIUM 9.4 9.3   Liver Function Tests: Recent Labs  Lab 06/30/22 1630 07/01/22 0352  AST 15 22  ALT 15 19  ALKPHOS 56 61  BILITOT 0.3 0.7  PROT 6.7 6.1*  ALBUMIN 4.2 3.7   CBG: Recent Labs  Lab 06/30/22 1509 07/01/22 0648 07/01/22 1149  GLUCAP 119* 99 93    Discharge time spent: greater than 30 minutes.  Signed: Joycelyn Das, MD [facility] 07/01/2022

## 2022-07-01 NOTE — Progress Notes (Signed)
2D echocardiogram completed.  07/01/2022 10:48 AM Eula Fried., MHA, RVT, RDCS, RDMS

## 2022-07-01 NOTE — H&P (Signed)
History and Physical    Edwin [name] [id number] DOB: [birth year] DOA: 06/30/2022  PCP: Josetta Huddle, MD  Patient coming from: Home.  Chief Complaint: Left-sided numbness and weakness.  HPI: Edwin [name] is a 66 y.o. male with history of prior stroke, diabetes mellitus, bipolar, chronic kidney disease, sleep apnea, hypertension has been experiencing left lower extremity weakness for some weeks now but yesterday around 1 PM he started noticing that he was not able to walk straight and was swerving towards the left.  His numbness also worsened.  He presents to the ER.  ED Course: In the ER patient had a CT head and was evaluated by Dr. Leonel Ramsay teleneurologist.  Patient was started on Plavix after loading dose and admitted for further stroke work-up.  On exam patient also has left lower extremity edema.  Patient was not felt to be a candidate for tPA since he was beyond the window.  Review of Systems: As per HPI, rest all negative.   Past Medical History:  Diagnosis Date   Allergic rhinitis    Bipolar disorder ([place])    Bradycardia 2012    due to lithium   CKD (chronic kidney disease) stage 2, GFR 60-89 ml/min    Gout    Hypertension    Mild renal insufficiency    , with creatinine of 1.3 in 2010, was side effect of med   Obesity    ,moderate   Prostatitis    , Episodic   Stroke [facility])    Suicide attempt ([place]) 09/20/2014    Past Surgical History:  Procedure Laterality Date   APPENDECTOMY     CATARACT EXTRACTION Right    COLONOSCOPY WITH PROPOFOL N/A 06/14/2014   Procedure: COLONOSCOPY WITH PROPOFOL;  Surgeon: Garlan Fair, MD;  Location: [facility] ENDOSCOPY;  Service: Endoscopy;  Laterality: N/A;   TONSILLECTOMY     UMBILICAL HERNIA REPAIR       reports that he has never smoked. He has never used smokeless tobacco. He reports that he does not drink alcohol and does not use drugs.  Allergies  Allergen Reactions   Allopurinol Other (See Comments)    Made pt  emotionally unstable  Other reaction(s): emotionally labile   Penicillins Hives and Other (See Comments)    Has patient had a PCN reaction causing immediate rash, facial/tongue/throat swelling, SOB or lightheadedness with hypotension: No Has patient had a PCN reaction causing severe rash involving mucus membranes or skin necrosis: No Has patient had a PCN reaction that required hospitalization No Has patient had a PCN reaction occurring within the last 10 years: No If all of the above answers are "NO", then may proceed with Cephalosporin use.   Finerenone Other (See Comments)    hyperkalemia Other reaction(s): Increases  potassium levels   Penicillin G     Other reaction(s): hives   Aspirin Hives   Ciprofloxacin Rash    Other reaction(s): rash    Family History  Problem Relation Age of Onset   Other Mother        Viral Meningitis   Mitral valve prolapse Mother    Arrhythmia Mother    Hypothyroidism Mother    Stroke Mother    Hypertension Father    Gout Father    Alzheimer's disease Father     Prior to Admission medications   Medication Sig Start Date End Date Taking? Authorizing Provider  Cholecalciferol 1000 units TBDP Take 1 tablet by mouth daily.     [provider]  clopidogrel (PLAVIX) 75 MG tablet Take 1 tablet (75 mg total) by mouth daily. 06/06/22   Butch Penny, NP  dapagliflozin propanediol (FARXIGA) 10 MG TABS tablet Take 10 mg by mouth daily.    [provider]  diclofenac Sodium (VOLTAREN) 1 % GEL Apply 2 g topically daily as needed (for knee pain).    [provider]  divalproex (DEPAKOTE ER) 500 MG 24 hr tablet Take 2 tablets (1,000 mg total) by mouth at bedtime. 05/01/22   Cottle, Steva Ready., MD  DULoxetine (CYMBALTA) 60 MG capsule Take 2 capsules (120 mg total) by mouth daily. 05/01/22   Cottle, Steva Ready., MD  febuxostat (ULORIC) 40 MG tablet Take 40 mg by mouth daily.    [provider]  furosemide (LASIX) 20 MG tablet  Take 20 mg by mouth daily as needed for edema. May take 2 to 3 days weekly 07/21/19   [provider]  gabapentin (NEURONTIN) 100 MG capsule Take 1 capsule (100 mg total) by mouth 2 (two) times daily. 06/25/22   Butch Penny, NP  hydrALAZINE (APRESOLINE) 50 MG tablet Take 1 tablet (50 mg total) by mouth 3 (three) times daily. 02/06/22   Ghimire, Werner Lean, MD  lamoTRIgine (LAMICTAL) 200 MG tablet Take 1 tablet (200 mg total) by mouth every morning. 05/01/22   Cottle, Steva Ready., MD  LevOCARNitine (L-CARNITINE) 500 MG TABS Take 500 mg by mouth 2 (two) times daily.    [provider]  losartan (COZAAR) 50 MG tablet Take 2 tablets (100 mg total) by mouth daily. 02/06/22   Ghimire, Werner Lean, MD  metFORMIN (GLUCOPHAGE-XR) 500 MG 24 hr tablet Take 500 mg by mouth 2 (two) times daily. 01/07/22   [provider]  multivitamin-lutein (OCUVITE-LUTEIN) CAPS capsule Take 1 capsule by mouth daily.    [provider]  pramipexole (MIRAPEX) 0.5 MG tablet Take 1 tablet (0.5 mg total) by mouth 2 (two) times daily. 05/01/22   Cottle, Steva Ready., MD  topiramate (TOPAMAX) 25 MG tablet Take 1 tablet (25 mg total) by mouth 2 (two) times daily. Patient not taking: Reported on 06/20/2022 06/03/22 07/03/22  Alvira Monday, MD    Physical Exam: Constitutional: Moderately built and nourished. Vitals:   06/30/22 2315 06/30/22 2345 07/01/22 0000 07/01/22 0100  BP: (!) 149/78 137/83  (!) 147/82  Pulse:   (!) 56 (!) 54  Resp: 16 16 19 18   Temp:    97.6 F (36.4 C)  TempSrc:    Oral  SpO2:   100% 100%   Eyes: Anicteric no pallor. ENMT: No discharge from the ears eyes nose and mouth. Neck: No mass felt.  No neck rigidity. Respiratory: No rhonchi or crepitations. Cardiovascular: S1 S2 heard. Abdomen: Soft nontender bowel sound present. Musculoskeletal: Left lower extremity edema. Skin: No rash. Neurologic: Alert awake oriented to time place and person.  Moving all extremities 5 x 5.  No  facial asymmetry tongue is midline pupils equal reacting to light. Psychiatric: Appears normal.  Normal affect.   Labs on Admission: I have personally reviewed following labs and imaging studies  CBC: Recent Labs  Lab 06/30/22 1630  WBC 6.8  NEUTROABS 4.4  HGB 12.9*  HCT 38.3*  MCV 94.3  PLT 192   Basic Metabolic Panel: Recent Labs  Lab 06/30/22 1630  NA 140  K 4.2  CL 104  CO2 27  GLUCOSE 80  BUN 41*  CREATININE 1.88*  CALCIUM 9.4   GFR: Estimated Creatinine Clearance:  46.8 mL/min (A) (by C-G formula based on SCr of 1.88 mg/dL (H)). Liver Function Tests: Recent Labs  Lab 06/30/22 1630  AST 15  ALT 15  ALKPHOS 56  BILITOT 0.3  PROT 6.7  ALBUMIN 4.2   No results for input(s): "LIPASE", "AMYLASE" in the last 168 hours. No results for input(s): "AMMONIA" in the last 168 hours. Coagulation Profile: Recent Labs  Lab 06/30/22 1630  INR 1.0   Cardiac Enzymes: No results for input(s): "CKTOTAL", "CKMB", "CKMBINDEX", "TROPONINI" in the last 168 hours. BNP (last 3 results) No results for input(s): "PROBNP" in the last 8760 hours. HbA1C: No results for input(s): "HGBA1C" in the last 72 hours. CBG: Recent Labs  Lab 06/30/22 1509  GLUCAP 119*   Lipid Profile: No results for input(s): "CHOL", "HDL", "LDLCALC", "TRIG", "CHOLHDL", "LDLDIRECT" in the last 72 hours. Thyroid Function Tests: No results for input(s): "TSH", "T4TOTAL", "FREET4", "T3FREE", "THYROIDAB" in the last 72 hours. Anemia Panel: No results for input(s): "VITAMINB12", "FOLATE", "FERRITIN", "TIBC", "IRON", "RETICCTPCT" in the last 72 hours. Urine analysis:    Component Value Date/Time   COLORURINE COLORLESS (A) 06/30/2022 2244   APPEARANCEUR CLEAR 06/30/2022 2244   LABSPEC 1.008 06/30/2022 2244   PHURINE 6.0 06/30/2022 2244   GLUCOSEU >1,000 (A) 06/30/2022 2244   HGBUR NEGATIVE 06/30/2022 2244   BILIRUBINUR NEGATIVE 06/30/2022 2244   KETONESUR NEGATIVE 06/30/2022 2244   PROTEINUR  NEGATIVE 06/30/2022 2244   UROBILINOGEN 0.2 09/20/2014 0058   NITRITE NEGATIVE 06/30/2022 2244   LEUKOCYTESUR NEGATIVE 06/30/2022 2244   Sepsis Labs: @LABRCNTIP (procalcitonin:4,lacticidven:4) )No results found for this or any previous visit (from the past 240 hour(s)).   Radiological Exams on Admission: CT HEAD CODE STROKE WO CONTRAST  Result Date: 06/30/2022 CLINICAL DATA:  Code stroke. Acute onset of balance issues beginning at 1 o'clock today. Previous infarcts. EXAM: CT HEAD WITHOUT CONTRAST TECHNIQUE: Contiguous axial images were obtained from the base of the skull through the vertex without intravenous contrast. RADIATION DOSE REDUCTION: This exam was performed according to the departmental dose-optimization program which includes automated exposure control, adjustment of the mA and/or kV according to patient size and/or use of iterative reconstruction technique. COMPARISON:  MR head without contrast 06/02/2022 CT head without contrast 06/02/2022. MR head without contrast 02/04/2022. FINDINGS: Brain: Remote infarct of the posterior right cerebellum again noted. Mild periventricular white matter changes are present. No acute infarct is present. Basal ganglia are intact. Insular ribbon is normal. The ventricles are of normal size. No significant extraaxial fluid collection is present. The brainstem infarct of the medulla is not well seen by CT. Vascular: No hyperdense vessel or unexpected calcification. Skull: Calvarium is intact. No focal lytic or blastic lesions are present. No significant extracranial soft tissue lesion is present. Sinuses/Orbits: The paranasal sinuses and mastoid air cells are clear. Bilateral lens replacements are noted. Globes and orbits are otherwise unremarkable. ASPECTS [facility] Stroke Program Early CT Score) - Ganglionic level infarction (caudate, lentiform nuclei, internal capsule, insula, M1-M3 cortex): 7/7 - Supraganglionic infarction (M4-M6 cortex): 3/3 Total score  (0-10 with 10 being normal): 10/10 IMPRESSION: 1. No acute intracranial abnormality or significant interval change. 2. Stable remote infarct of the posterior right cerebellum. 3. The brainstem infarct of the medulla is not well seen by CT. 4. Aspects 10/10 * These results were called by telephone at the time of interpretation on 06/30/2022 at 4:58 pm to provider Dr. Jeanell Sparrow, who verbally acknowledged these results. Electronically Signed   By: San Morelle M.D.   On:  06/30/2022 16:58    EKG: Independently reviewed.  Normal sinus rhythm IVCD.  Assessment/Plan Principal Problem:   CVA (cerebral vascular accident) (HCC) Active Problems:   Bipolar affective disorder, currently depressed, moderate (HCC)   Hypertension   Diabetes mellitus type 2 in nonobese (HCC)   Obstructive sleep apnea syndrome   Acute CVA (cerebrovascular accident) (HCC)    Possible CVA -appreciate neurology consult.  MRI brain and MRA head and neck 2D echo has been ordered.  Patient has passed stroke swallow.  Patient was loaded on Plavix and placed on aspirin.  Check lipid panel hemoglobin A1c neurochecks physical therapy consult allow for permissive hypertension. Diabetes mellitus type 2 usually takes metformin.  We will keep patient on sliding scale coverage follow hemoglobin A1c. Hypertension -allow for permissive hypertension..  I have hydralazine for systolic blood pressure more than 220. History of sleep apnea uses bedtime oxygen. Chronic kidney disease stage III creatinine appears to be at baseline. Anemia -follow CBC. Bipolar disorder on Depakote and Lamictal.  Check Depakote levels. Left lower extremity edema patient states this started after he started taking ropinirole.  We will check Dopplers to rule out DVT.   Since patient has possible acute CVA will need further management and close follow-up and inpatient status.   DVT prophylaxis: Lovenox. Code Status: Full code. Family Communication: Discussed with  patient. Disposition Plan: Home. Consults called: Neurology. Admission status: Inpatient.   Eduard Clos MD [facility] Pager [phone].  If 7PM-7AM, please contact night-coverage [email or web address] Password [email or web address]  07/01/2022, 2:18 AM

## 2022-07-02 ENCOUNTER — Ambulatory Visit: Payer: BC Managed Care – PPO | Admitting: Physical Therapy

## 2022-07-02 ENCOUNTER — Encounter: Payer: Self-pay | Admitting: Adult Health

## 2022-07-02 DIAGNOSIS — I69398 Other sequelae of cerebral infarction: Secondary | ICD-10-CM

## 2022-07-02 NOTE — Telephone Encounter (Signed)
Ok to send

## 2022-07-03 ENCOUNTER — Ambulatory Visit: Payer: BC Managed Care – PPO | Admitting: Neurology

## 2022-07-03 DIAGNOSIS — G4734 Idiopathic sleep related nonobstructive alveolar hypoventilation: Secondary | ICD-10-CM

## 2022-07-03 DIAGNOSIS — I6302 Cerebral infarction due to thrombosis of basilar artery: Secondary | ICD-10-CM

## 2022-07-03 DIAGNOSIS — G4733 Obstructive sleep apnea (adult) (pediatric): Secondary | ICD-10-CM | POA: Diagnosis not present

## 2022-07-03 NOTE — Telephone Encounter (Signed)
Called the patient and there was no answer. Left a detailed message. I am unsure who originally ordered his oxygen but advised that when he does the home sleep test he should not use the oxygen for the night of the study.  That way we will have a baseline sleep study to indicate if apnea is present as well as low oxygen concerns.  Instructed the patient if he had any further questions about this he could give [place] a call and left our number.

## 2022-07-03 NOTE — Telephone Encounter (Signed)
When patient came in today he was asking if he still needs to continue wearing his oxygen he stated he feels like it is not really helping him.

## 2022-07-04 ENCOUNTER — Encounter: Payer: Self-pay | Admitting: Neurology

## 2022-07-04 NOTE — Progress Notes (Signed)
[facility]   HOME SLEEP TEST REPORT ( by Watch PAT)   STUDY DATE:  07-04-2022 DOB:   MRN:    ORDERING CLINICIAN: Melvyn Novas, MD  REFERRING CLINICIAN: Butch Penny, NP    CLINICAL INFORMATION/HISTORY: Edwin [name] is a 66 y.o. year old White or Caucasian male patient seen here as a referral on 05/24/2022 from  Butch Penny, NP VIA Dr Pearlean Brownie, MD . Chief concern according to patient :  I have seen dr Earl Gala at [place] for several years. Pt referred for sleep consult. Dx with OSA and would like to try different option, like a mouth piece. Pts accidentally burnt/broke his CPAP when travelling, last used 02/2021. Last SS done in 2021 through [place]. DME [facility] at the time. Had a Resmed CPAP. He lost 50 pounds since his last sleep test 2019. He has voiced interest in dental device or inspire.   He was admitted on 02-04-2022 with left sided numbness and not weakness, diagnosed with a stroke. 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  Balance was off, handwriting was affected. ED report:  Edwin [name] is a 66 y.o. male with medical history significant of DM2, HTN, hypogonadism, bipolar d/o, gout. Presenting with left side numbness. He reports 4 days ago when he woke up, he thought he was having an episode of vertigo. He was dizzy and nauseous. He vomited once. He decided to take the rest of the day off to rest. The next day he was feeling better overall, but not 100%. He had a fall that day where he hit his head. He didn't have any LOS. He saw his PCP and was given meclizine. The next day day he was for the most part ok until he went to sleep. That night prior to sleep, he felt some tingling on his left side    Epworth sleepiness score: 6/24.   BMI: 32 kg/m   Neck Circumference: 18"   FINDINGS:   Sleep Summary:   Total Recording Time (hours, min):    7 hours 21 minutes of which the total sleep time was calculated as 5 hours and 35 minutes with 11.3% REM sleep.                              Respiratory Indices:   Calculated pAHI (per hour):    Total AHI was 25.9, during REM sleep AHI rose to 38.8/h, in non-REM sleep AHI was 24.2/h.                                                   Positional AHI: The patient slept mostly in prone position associated with an AHI of 24.4 followed by supine position with an AHI of 14 9/h and by left-sided sleep position with an AHI of 2.6/h.  Snoring level reached a mean volume of 44 dB and was present for over 60% of total sleep time.  Sleep architecture was heavily fragmented.                                                 Oxygen Saturation Statistics:   O2  Saturation Range (%):     Oxygen saturation varied between a nadir at 80% and a maximum saturation of 100% with a mean saturation of 93%.                                  O2 Saturation (minutes) <89%: 17.5 minutes equivalent to 5.2% total sleep time.         Pulse Rate Statistics:              Pulse Range:     Between 38 and 100 bpm with a mean heart rate of 64 bpm.            IMPRESSION:  This HST confirms the presence of moderate-severe obstructive sleep apnea not REM sleep dependent but accentuated in rem sleep.  And associated with oxygen desaturations.  Sleep was interrupted by multiple.  Of wakefulness, 16 times.  REM sleep latency was 40 minutes sleep latency was 15 minutes.     RECOMMENDATION: I would recommend positional sleep therapy first, avoiding prone and supine sleep and concentrate on left or right lateral sleep positions.   #2 given the history of fairly recent strokes I would strongly recommend to continue a positive airway pressure therapy.    INTERPRETING PHYSICIAN:   Melvyn Novas, MD   Medical Director of [facility].

## 2022-07-06 NOTE — Procedures (Signed)
    [facility]   HOME SLEEP TEST REPORT ( by Watch PAT)   STUDY DATE:  07-04-2022 DOB:   MRN:    ORDERING CLINICIAN: Jameah Rouser, MD  REFERRING CLINICIAN: Megan Millikan, NP    CLINICAL INFORMATION/HISTORY: Edwin [name] is a 66 y.o. year old White or Caucasian male patient seen here as a referral on 05/24/2022 from  Megan Millikan, NP VIA Dr Sethi, MD . Chief concern according to patient :  I have seen dr Osborne at [facility] for several years. Pt referred for sleep consult. Dx with OSA and would like to try different option, like a mouth piece. Pts accidentally burnt/broke his CPAP when travelling, last used 02/2021. Last SS done in 2021 through [facility]. D[facility] at the time. Had a Resmed CPAP. He lost 50 pounds since his last sleep test 2019. He has voiced interest in dental device or inspire.   He was admitted on 02-04-2022 with left sided numbness and not weakness, diagnosed with a stroke. 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  Balance was off, handwriting was affected. ED report:  Kenneith B Cape is a 65 y.o. male with medical history significant of DM2, HTN, hypogonadism, bipolar d/o, gout. Presenting with left side numbness. He reports 4 days ago when he woke up, he thought he was having an episode of vertigo. He was dizzy and nauseous. He vomited once. He decided to take the rest of the day off to rest. The next day he was feeling better overall, but not 100%. He had a fall that day where he hit his head. He didn't have any LOS. He saw his PCP and was given meclizine. The next day day he was for the most part ok until he went to sleep. That night prior to sleep, he felt some tingling on his left side    Epworth sleepiness score: 6/24.   BMI: 32 kg/m   Neck Circumference: 18"   FINDINGS:   Sleep Summary:   Total Recording Time (hours, min):    7 hours 21 minutes of which the total sleep time was calculated as 5 hours and 35 minutes with 11.3% REM sleep.                              Respiratory Indices:   Calculated pAHI (per hour):    Total AHI was 25.9, during REM sleep AHI rose to 38.8/h, in non-REM sleep AHI was 24.2/h.                                                   Positional AHI: The patient slept mostly in prone position associated with an AHI of 24.4 followed by supine position with an AHI of 14 9/h and by left-sided sleep position with an AHI of 2.6/h.  Snoring level reached a mean volume of 44 dB and was present for over 60% of total sleep time.  Sleep architecture was heavily fragmented.                                                 Oxygen Saturation Statistics:   O2   Saturation Range (%):     Oxygen saturation varied between a nadir at 80% and a maximum saturation of 100% with a mean saturation of 93%.                                  O2 Saturation (minutes) <89%: 17.5 minutes equivalent to 5.2% total sleep time.         Pulse Rate Statistics:              Pulse Range:     Between 38 and 100 bpm with a mean heart rate of 64 bpm.            IMPRESSION:  This HST confirms the presence of moderate-severe obstructive sleep apnea not REM sleep dependent but accentuated in rem sleep.  And associated with oxygen desaturations.  Sleep was interrupted by multiple.  Of wakefulness, 16 times.  REM sleep latency was 40 minutes sleep latency was 15 minutes.     RECOMMENDATION: I would recommend positional sleep therapy first, avoiding prone and supine sleep and concentrate on left or right lateral sleep positions.   #2 given the history of fairly recent strokes I would strongly recommend to continue a positive airway pressure therapy.    INTERPRETING PHYSICIAN:   Melvyn Novas, MD   Medical Director of [facility].

## 2022-07-09 ENCOUNTER — Ambulatory Visit: Payer: BC Managed Care – PPO | Admitting: Physical Therapy

## 2022-07-09 ENCOUNTER — Encounter: Payer: Self-pay | Admitting: Physical Therapy

## 2022-07-09 VITALS — BP 128/83 | HR 70

## 2022-07-09 DIAGNOSIS — I69354 Hemiplegia and hemiparesis following cerebral infarction affecting left non-dominant side: Secondary | ICD-10-CM

## 2022-07-09 DIAGNOSIS — R278 Other lack of coordination: Secondary | ICD-10-CM | POA: Diagnosis not present

## 2022-07-09 DIAGNOSIS — R2689 Other abnormalities of gait and mobility: Secondary | ICD-10-CM

## 2022-07-09 DIAGNOSIS — M6281 Muscle weakness (generalized): Secondary | ICD-10-CM

## 2022-07-09 DIAGNOSIS — R269 Unspecified abnormalities of gait and mobility: Secondary | ICD-10-CM | POA: Diagnosis not present

## 2022-07-09 DIAGNOSIS — I69398 Other sequelae of cerebral infarction: Secondary | ICD-10-CM | POA: Diagnosis not present

## 2022-07-09 DIAGNOSIS — R208 Other disturbances of skin sensation: Secondary | ICD-10-CM | POA: Diagnosis not present

## 2022-07-09 DIAGNOSIS — R2681 Unsteadiness on feet: Secondary | ICD-10-CM

## 2022-07-09 DIAGNOSIS — R202 Paresthesia of skin: Secondary | ICD-10-CM | POA: Diagnosis not present

## 2022-07-09 NOTE — Therapy (Signed)
OUTPATIENT PHYSICAL THERAPY NEURO EVALUATION   Patient Name: Edwin [name] MRN: [id number] DOB:17-Sep-1956, 66 y.o., male Today's Date: 07/09/2022   PCP: Josetta Huddle, MD REFERRING PROVIDER: Ward Givens, NP    PT End of Session - 07/09/22 0950     Visit Number 1    Number of Visits 9    Date for PT Re-Evaluation 09/21/22    Authorization Type BLUE CROSS BLUE SHIELD    PT Start Time 0935    PT Stop Time 1022    PT Time Calculation (min) 47 min    Equipment Utilized During Treatment Gait belt    Activity Tolerance Patient tolerated treatment well    Behavior During Therapy WFL for tasks assessed/performed             Past Medical History:  Diagnosis Date   Allergic rhinitis    Bipolar disorder ([place])    Bradycardia 2012    due to lithium   CKD (chronic kidney disease) stage 2, GFR 60-89 ml/min    Gout    Hypertension    Mild renal insufficiency    , with creatinine of 1.3 in 2010, was side effect of med   Obesity    ,moderate   Prostatitis    , Episodic   Stroke ([place])    Suicide attempt ([place]) 09/20/2014   Past Surgical History:  Procedure Laterality Date   APPENDECTOMY     CATARACT EXTRACTION Right    COLONOSCOPY WITH PROPOFOL N/A 06/14/2014   Procedure: COLONOSCOPY WITH PROPOFOL;  Surgeon: Garlan Fair, MD;  Location: [facility];  Service: Endoscopy;  Laterality: N/A;   TONSILLECTOMY     UMBILICAL HERNIA REPAIR     Patient Active Problem List   Diagnosis Date Noted   Acute CVA (cerebrovascular accident) ([place]) 07/01/2022   Pain in right ankle and joints of right foot 02/22/2022   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[place]) 02/22/2022   Allergic rhinitis 02/22/2022   Bipolar disorder in remission ([place]) 02/22/2022   Body mass index (BMI) 37.0-37.9, adult 02/22/2022   Bradycardia 02/22/2022   Cat scratch of forearm 02/22/2022   Cerebral infarction ([place]) 02/22/2022   Closed fracture of metatarsal bone 02/22/2022   Constipation 02/22/2022   Diabetic renal disease  ([place]) 02/22/2022   Dyslipidemia 02/22/2022   Edema 02/22/2022   Electrocardiogram abnormal 02/22/2022   Elevated PSA 02/22/2022   Enthesopathy 02/22/2022   Erectile dysfunction 02/22/2022   History of small bowel obstruction 02/22/2022   Hyperglycemia 02/22/2022   Hyperglycemia due to type 2 diabetes mellitus ([place]) 02/22/2022   Insomnia 02/22/2022   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   Impairment of balance 02/22/2022   Major depression, single episode 02/22/2022   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   Melena 02/22/2022   Mixed hyperlipidemia 02/22/2022   Obstructive sleep apnea syndrome 02/22/2022   Other long term (current) drug therapy 02/22/2022   Encounter for immunization 02/22/2022   Pneumonia due to SARS-associated coronavirus 02/22/2022   Right lower quadrant pain 02/22/2022   Severe recurrent major depression without psychotic features ([place]) [id number]   Umbilical hernia [id number]   Vitamin D deficiency 02/22/2022   Left-sided weakness 02/04/2022   Diabetes mellitus type 2 in nonobese ([place]) 02/04/2022   CVA (cerebral vascular accident) ([place]) 02/04/2022   Enteritis of ileum 02/02/2017   Intestinal obstruction ([place]) 02/02/2017   Persistent recurrent vomiting 02/01/2017   Chronic kidney disease, stage 3 unspecified ([place]) 05/19/2016   Abdominal pain 05/19/2016   Hypokalemia 05/19/2016   Primary gout  Bipolar affective disorder, currently depressed, moderate (HCC) 09/20/2014   Hypertension 11/23/2013   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    ONSET DATE: 07/02/2022   REFERRING DIAG: V25.366,Y40.3 (ICD-10-CM) - Abnormality of gait as late effect of cerebrovascular accident (CVA)   THERAPY DIAG:  Other abnormalities of gait and mobility - Plan: PT plan of care cert/re-cert  Unsteadiness on feet - Plan: PT plan of care cert/re-cert  Hemiplegia and hemiparesis following cerebral infarction affecting left non-dominant side (HCC) - Plan: PT  plan of care cert/re-cert  Muscle weakness (generalized) - Plan: PT plan of care cert/re-cert  Other disturbances of skin sensation - Plan: PT plan of care cert/re-cert  Other lack of coordination - Plan: PT plan of care cert/re-cert  Rationale for Evaluation and Treatment Rehabilitation  SUBJECTIVE:                                                                                                                                                                                              SUBJECTIVE STATEMENT: I feel like I am still veering to the left.  They increased my Gabapentin.  I have to occasionally catch myself when I go out walking and I have to be very careful at home if I step wrong.  He has been able to walk about 2 miles outdoors.  States he is struggling with dropping things with left hand, he dropped fork last night.  He does not want an OT referral right now because "it's just the tingling" and he is hopeful the higher dose of Gabapentin will help. Pt accompanied by: self  PERTINENT HISTORY: HTN, Bipolar Disorder, CKD stage 3, CVA, DM2   "presented to the ED on March 19 with left-sided sensory deficit and difficulty with ambulation. He did not receive TNKase due to being out of the window."  Pt returned to the ED on July 15 w/ Left arm and leg numbness.  He has residual N/T in left arm and leg from prior stroke, but this was marked worsening.  Pt returned to ED on 06/30/2022 w/ worsening of symptoms.  Discharged w/o evidence of recurrence of stroke. Pt use 3LPM nasal cannula at home only when sleeping, states PCP prescribed this when he left hospital as he could not get CPAP.  He did the home sleep study to see if he can get another CPAP. No hearing or vision issues. 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  PAIN:  Are you having pain? No  PRECAUTIONS: Fall  WEIGHT BEARING RESTRICTIONS No  FALLS: Has patient fallen in last 6 months? Yes. Number of falls 2-I  tripped over the bathroom scales in March, on July 4 I lost my balance reaching to a low shelf and fell backwards and hurt my back falling on some music equipment, most other incidents are near misses. - No fall since most recent admission.  Several near misses.  LIVING ENVIRONMENT: Lives with: lives alone and has 3 cats and wife is currently in the [place], hopes she will be here in 6 weeks Lives in: House/apartment Stairs: Yes: External: 3-4 steps; bilateral but cannot reach both Has following equipment at home: Single point cane, Walker - 4 wheeled, shower chair, and Grab bars  PLOF: Independent  PATIENT GOALS "I'd like to regain my balance."  OBJECTIVE:  VITALS (RUE in sitting): Today's Vitals   07/09/22 0941  BP: 128/83  Pulse: 70   DIAGNOSTIC FINDINGS: MRI head (06/02/2022) - Small acute infarct in the Right Medullary Pyramid. Small chronic infarct in the right cerebellum CT/MRI of head and neck from most recent admission negative.  COGNITION: Overall cognitive status: No family/caregiver present to determine baseline cognitive functioning   SENSATION: Light touch: WFL Pt states N/T of left arm and hand primarily  COORDINATION: Heel-to-shin:  WNL LE RAMPS:  WNL  EDEMA:  1+ pitting  MUSCLE TONE: none noted bilaterally  POSTURE: rounded shoulders, forward head, posterior pelvic tilt, and lateral left lean in standing/ambulation  LOWER EXTREMITY ROM:    Active Right Eval Left Eval  Hip flexion WNL WNL  Hip extension    Hip abduction " "  Hip adduction " "  Hip internal rotation    Hip external rotation    Knee flexion " "  Knee extension " "  Ankle dorsiflexion " "  Ankle plantarflexion    Ankle inversion    Ankle eversion      (Blank rows = not tested)  LOWER EXTREMITY MMT:   MMT  Right Eval Left Eval  Hip flexion 5/5 3+/5  Hip extension    Hip abduction 5/5 4/5  Hip adduction 5/5 5/5  Hip internal rotation    Hip external rotation    Knee  flexion 5/5 4+/5  Knee extension 5/5 4/5  Ankle dorsiflexion 5/5 4+/5  Ankle plantarflexion    Ankle inversion    Ankle eversion     (Blank rows = not tested)  BED MOBILITY:  Sit to supine Complete Independence Supine to sit Complete Independence  TRANSFERS: Assistive device utilized: None  Sit to stand: Complete Independence Stand to sit: Complete Independence Chair to chair: Complete Independence Floor: Complete Independence  GAIT: Gait pattern: step through pattern, decreased arm swing- Left, decreased step length- Left, decreased hip/knee flexion- Left, and decreased ankle dorsiflexion- Left Distance walked: various clinic distances Assistive device utilized: None Level of assistance: Complete Independence and SBA Comments:  No veering noted from linear pathway.  Pt does lean to left mildly during ambulation w/ inc caution during left swing through.  FUNCTIONAL TESTs:  5 times sit to stand: 13.51sec w/ hands on armrests 10 meter walk test: 13.53sec no AD = 0.74 m/sec OR 2.44 ft/sec Functional gait assessment: 13/30  [facility] PT Assessment - 07/09/22 1016       Assessment   Medical Diagnosis --      Functional Gait  Assessment   Gait assessed  Yes    Gait Level Surface Walks 20 ft in less than 7 sec but greater than 5.5 sec, uses assistive device, slower speed, mild gait deviations, or deviates 6-10 in outside of the 12 in walkway  width.    Change in Gait Speed Able to change speed, demonstrates mild gait deviations, deviates 6-10 in outside of the 12 in walkway width, or no gait deviations, unable to achieve a major change in velocity, or uses a change in velocity, or uses an assistive device.    Gait with Horizontal Head Turns Performs head turns smoothly with slight change in gait velocity (eg, minor disruption to smooth gait path), deviates 6-10 in outside 12 in walkway width, or uses an assistive device.    Gait with Vertical Head Turns Performs task with moderate change  in gait velocity, slows down, deviates 10-15 in outside 12 in walkway width but recovers, can continue to walk.    Gait and Pivot Turn Turns slowly, requires verbal cueing, or requires several small steps to catch balance following turn and stop    Step Over Obstacle Is able to step over one shoe box (4.5 in total height) without changing gait speed. No evidence of imbalance.    Gait with Narrow Base of Support Ambulates less than 4 steps heel to toe or cannot perform without assistance.    Gait with Eyes Closed Cannot walk 20 ft without assistance, severe gait deviations or imbalance, deviates greater than 15 in outside 12 in walkway width or will not attempt task.    Ambulating Backwards Walks 20 ft, slow speed, abnormal gait pattern, evidence for imbalance, deviates 10-15 in outside 12 in walkway width.    Steps Alternating feet, must use rail.    Total Score 13    FGA comment: Significant Fall Risk            PATIENT SURVEYS:  FOTO N/A due to chronicity of CVA  TODAY'S TREATMENT:  N/A  PATIENT EDUCATION: Education details: PT POC, assessments used, and goals set. Person educated: Patient Education method: Explanation Education comprehension: verbalized understanding  HOME EXERCISE PROGRAM: N/A  GOALS: Goals reviewed with patient? Yes  SHORT TERM GOALS: Target date: 08/10/2022  Pt will be independent with strength and balance HEP. Baseline:  To be established. Goal status: INITIAL  2.  Pt will demonstrate a gait speed of >/=3.0 feet/sec in order to decrease risk for falls. Baseline: 2.44 ft/sec Goal status: INITIAL  3.  Pt will decrease 5xSTS to </=13 seconds without use of UE support in order to demonstrate decreased risk for falls and improved functional bilateral LE strength and power. Baseline: 13.51 sec w/ BUE support Goal status: INITIAL  4.  Pt will improve FGA score to >/=18/30 in order to demonstrate improved balance and decreased fall risk. Baseline:   13/30 Goal status: INITIAL  LONG TERM GOALS: Target date: 09/07/2022  Pt will ambulate independently >/=500 feet across various surfaces using improve symmetical gait and stance on LLE w/o AD to promote household and community access. Baseline: various clinic distances w/ dec L stance Goal status: INITIAL  2.  Pt will improve FGA score to >/=23/30 in order to demonstrate improved balance and decreased fall risk. Baseline: 13/30 Goal status: INITIAL  3.   Pt will demonstrate improved proximal LLE strength to improve overall gait mechanics and dynamic balance. Baseline: LLE proximally grossly 4/5 Goal status: INITIAL  ASSESSMENT:  CLINICAL IMPRESSION: Patient is a 66 y.o. male who was seen today for physical therapy re-evaluation and treatment for residual left sided N/T following prior infarct in the Right Medullary Pyramid and the right cerebellum in mid-July with recent re-admission due to fear of recurrent CVA.  No current evidence supporting recurrence  of CVA or TIA.  Pt has a significant PMH of HTN, Bipolar Disorder, CKD stage 3, CVA, and DM2.  Identified impairments include left N/T in the hand and arm primarily, intermittent ankle swelling, mild fwd head posturing with associated postural changes, proximal LLE weakness, and mild gait deviations involving the LLE.  Evaluation via the following assessment tools: 5xSTS, , and FGA indicate mild fall risk.  He would benefit from skilled PT to address impairments as noted and progress towards long term goals.  OBJECTIVE IMPAIRMENTS Abnormal gait, decreased activity tolerance, decreased balance, decreased strength, increased edema, impaired sensation, and postural dysfunction.   ACTIVITY LIMITATIONS locomotion level  PARTICIPATION LIMITATIONS: community activity and occupation  PERSONAL FACTORS Age, Time since onset of injury/illness/exacerbation, and 1-2 comorbidities: DM2, HTN  are also affecting patient's functional outcome.    REHAB POTENTIAL: Excellent  CLINICAL DECISION MAKING: Stable/uncomplicated  EVALUATION COMPLEXITY: Low  PLAN: PT FREQUENCY: 1x/week  PT DURATION: 8 weeks  PLANNED INTERVENTIONS: Therapeutic exercises, Therapeutic activity, Neuromuscular re-education, Balance training, Gait training, Patient/Family education, Self Care, Stair training, Vestibular training, and Re-evaluation  PLAN FOR NEXT SESSION: Initial walking program and HEP for left LE strength.   Sadie Haber, PT, DPT 07/09/2022, 2:47 PM

## 2022-07-11 DIAGNOSIS — R269 Unspecified abnormalities of gait and mobility: Secondary | ICD-10-CM | POA: Diagnosis not present

## 2022-07-11 DIAGNOSIS — I10 Essential (primary) hypertension: Secondary | ICD-10-CM | POA: Diagnosis not present

## 2022-07-11 DIAGNOSIS — R2689 Other abnormalities of gait and mobility: Secondary | ICD-10-CM | POA: Diagnosis not present

## 2022-07-11 DIAGNOSIS — G459 Transient cerebral ischemic attack, unspecified: Secondary | ICD-10-CM | POA: Diagnosis not present

## 2022-07-16 ENCOUNTER — Ambulatory Visit: Payer: BC Managed Care – PPO | Admitting: Physical Therapy

## 2022-07-17 ENCOUNTER — Ambulatory Visit: Payer: BC Managed Care – PPO | Admitting: Physical Therapy

## 2022-07-17 DIAGNOSIS — G4734 Idiopathic sleep related nonobstructive alveolar hypoventilation: Secondary | ICD-10-CM | POA: Diagnosis not present

## 2022-07-24 ENCOUNTER — Encounter: Payer: Self-pay | Admitting: Adult Health

## 2022-07-25 ENCOUNTER — Ambulatory Visit: Payer: BC Managed Care – PPO | Attending: Adult Health | Admitting: Physical Therapy

## 2022-07-25 ENCOUNTER — Encounter: Payer: Self-pay | Admitting: Physical Therapy

## 2022-07-25 VITALS — BP 122/68 | HR 75

## 2022-07-25 DIAGNOSIS — M6281 Muscle weakness (generalized): Secondary | ICD-10-CM | POA: Insufficient documentation

## 2022-07-25 DIAGNOSIS — R2681 Unsteadiness on feet: Secondary | ICD-10-CM | POA: Insufficient documentation

## 2022-07-25 DIAGNOSIS — I69354 Hemiplegia and hemiparesis following cerebral infarction affecting left non-dominant side: Secondary | ICD-10-CM | POA: Diagnosis not present

## 2022-07-25 DIAGNOSIS — R2689 Other abnormalities of gait and mobility: Secondary | ICD-10-CM | POA: Diagnosis not present

## 2022-07-25 MED ORDER — GABAPENTIN 400 MG PO CAPS: 400.0000 mg | ORAL_CAPSULE | Freq: Two times a day (BID) | ORAL | 5 refills | Status: DC

## 2022-07-25 NOTE — Therapy (Signed)
OUTPATIENT PHYSICAL THERAPY TREATMENT NOTE   Patient Name: Edwin [name] MRN: [id number] DOB:1956-04-08, 66 y.o., male Today's Date: 07/25/2022  PCP: Marden Noble, MD REFERRING PROVIDER: Butch Penny, NP   END OF SESSION:   PT End of Session - 07/25/22 0856     Visit Number 2    Number of Visits 9    Date for PT Re-Evaluation 09/21/22    Authorization Type [employer]    PT Start Time 0848    PT Stop Time 0930    PT Time Calculation (min) 42 min    Equipment Utilized During Treatment Gait belt    Activity Tolerance Patient tolerated treatment well    Behavior During Therapy WFL for tasks assessed/performed             Past Medical History:  Diagnosis Date   Allergic rhinitis    Bipolar disorder (HCC)    Bradycardia 2012    due to lithium   CKD (chronic kidney disease) stage 2, GFR 60-89 ml/min    Gout    Hypertension    Mild renal insufficiency    , with creatinine of 1.3 in 2010, was side effect of med   Obesity    ,moderate   Prostatitis    , Episodic   Stroke (HCC)    Suicide attempt (HCC) 09/20/2014   Past Surgical History:  Procedure Laterality Date   APPENDECTOMY     CATARACT EXTRACTION Right    COLONOSCOPY WITH PROPOFOL N/A 06/14/2014   Procedure: COLONOSCOPY WITH PROPOFOL;  Surgeon: Charolett Bumpers, MD;  Location: [facility];  Service: Endoscopy;  Laterality: N/A;   TONSILLECTOMY     UMBILICAL HERNIA REPAIR     Patient Active Problem List   Diagnosis Date Noted   Acute CVA (cerebrovascular accident) (HCC) 07/01/2022   Pain in right ankle and joints of right foot 02/22/2022   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HCC) 02/22/2022   Allergic rhinitis 02/22/2022   Bipolar disorder in remission (HCC) 02/22/2022   Body mass index (BMI) 37.0-37.9, adult 02/22/2022   Bradycardia 02/22/2022   Cat scratch of forearm 02/22/2022   Cerebral infarction (HCC) 02/22/2022   Closed fracture of metatarsal bone 02/22/2022   Constipation 02/22/2022   Diabetic  renal disease (HCC) 02/22/2022   Dyslipidemia 02/22/2022   Edema 02/22/2022   Electrocardiogram abnormal 02/22/2022   Elevated PSA 02/22/2022   Enthesopathy 02/22/2022   Erectile dysfunction 02/22/2022   History of small bowel obstruction 02/22/2022   Hyperglycemia 02/22/2022   Hyperglycemia due to type 2 diabetes mellitus (HCC) 02/22/2022   Insomnia 02/22/2022   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   Impairment of balance 02/22/2022   Major depression, single episode 02/22/2022   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   Melena 02/22/2022   Mixed hyperlipidemia 02/22/2022   Obstructive sleep apnea syndrome 02/22/2022   Other long term (current) drug therapy 02/22/2022   Encounter for immunization 02/22/2022   Pneumonia due to SARS-associated coronavirus 02/22/2022   Right lower quadrant pain 02/22/2022   Severe recurrent major depression without psychotic features (HCC) 02/22/2022   Umbilical hernia 02/22/2022   Vitamin D deficiency 02/22/2022   Left-sided weakness 02/04/2022   Diabetes mellitus type 2 in nonobese (HCC) 02/04/2022   CVA (cerebral vascular accident) (HCC) 02/04/2022   Enteritis of ileum 02/02/2017   Intestinal obstruction (HCC) 02/02/2017   Persistent recurrent vomiting 02/01/2017   Chronic kidney disease, stage 3 unspecified (HCC) 05/19/2016   Abdominal pain 05/19/2016   Hypokalemia 05/19/2016  Primary gout    Bipolar affective disorder, currently depressed, moderate (HCC) 09/20/2014   Hypertension 11/23/2013   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 He did not receive TNKase due to being out of the window."  Pt returned to the ED on July 15 w/ Left arm and leg numbness.  He has residual N/T in left arm and leg from prior stroke, but this was marked worsening.  Pt returned to ED on 06/30/2022 w/ worsening of symptoms.  Discharged w/o evidence of recurrence of stroke. Pt use 3LPM nasal cannula at home only when sleeping, states PCP prescribed this when he left hospital as he could not get CPAP.  He did the home sleep study to see if he can get another CPAP. No hearing or vision issues. 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  He states his PCP added amlodipine.  He endorses that his hand remains his concern, but does not feel OT is necessary at this time.  PAIN:  Are you having pain? No -Left hemibody N/T that he does not consider painful   OBJECTIVE: (objective measures completed at initial evaluation unless otherwise dated)   TODAY'S TREATMENT:  Discussed continuing his 0.5 hour walk 6-7 days vs initiating formal beginner walking program.  Initiated HEP: -Seated hamstrings curls 2x12 each LE -SLS at counter 2x30sec each LE -Lunges 2x8 each LE at counter -Squats to edge of chair 2x6; repeated demo to correct anterior translation of trunk -Staggered STS w/ L back x10; added chair back in front for 2-finger balance and anterior weight shifting when sitting controlled    PATIENT EDUCATION: Education details: Initial HEP. Person educated: Patient Education method: Explanation Education comprehension: verbalized  understanding   HOME EXERCISE PROGRAM: Access Code: [email or web address] URL: [email or web address] Date: 07/25/2022 Prepared by: Camille Bal  Exercises - Seated Hamstring Curls with Resistance  - 1 x daily - 4-5 x weekly - 2 sets - 12 reps - Staggered Sit-to-Stand  - 1 x daily - 4-5 x weekly - 2 sets - 10 reps - Squat with Chair Touch  - 1 x daily - 5 x weekly - 3 sets - 6 reps - Standing Single Leg Stance with Counter Support  - 1 x daily - 3-4 x weekly - 1 sets - 3-4 reps - 30 seconds hold - Lunge with Counter Support  - 1 x daily - 5 x weekly - 2 sets - 8 reps   GOALS: Goals reviewed with patient? Yes   SHORT TERM GOALS: Target date: 08/10/2022   Pt will be independent with strength and balance HEP. Baseline:  To be established. Goal status: INITIAL   2.  Pt will demonstrate a gait speed of >/=3.0 feet/sec in order to decrease risk for falls.  Baseline: 2.44 ft/sec Goal status: INITIAL   3.  Pt will decrease 5xSTS to </=13 seconds without use of UE support in order to demonstrate decreased risk for falls and improved functional bilateral LE strength and power. Baseline: 13.51 sec w/ BUE support Goal status: INITIAL   4.  Pt will improve FGA score to >/=18/30 in order to demonstrate improved balance and decreased fall risk. Baseline:  13/30 Goal status: INITIAL   LONG TERM GOALS: Target date: 09/07/2022   Pt will ambulate independently >/=500 feet across various surfaces using improve symmetical gait and stance on LLE w/o AD to promote household and community access. Baseline: various clinic distances w/ dec L stance Goal status: INITIAL   2.  Pt will improve FGA score to >/=23/30 in order to demonstrate improved balance and decreased fall risk. Baseline: 13/30 Goal status: INITIAL   3.   Pt will demonstrate improved proximal LLE strength to improve overall gait mechanics and dynamic balance. Baseline: LLE proximally grossly 4/5 Goal status: INITIAL    ASSESSMENT:   CLINICAL IMPRESSION: Focus of skilled session on establishing a home program for LE strength.  He continues to benefit from skilled PT to further address left hemibody strength and dynamic balance.  Will further address noted deficits in coming session per PT POC.   OBJECTIVE IMPAIRMENTS Abnormal gait, decreased activity tolerance, decreased balance, decreased strength, increased edema, impaired sensation, and postural dysfunction.    ACTIVITY LIMITATIONS locomotion level   PARTICIPATION LIMITATIONS: community activity and occupation   PERSONAL FACTORS Age, Time since onset of injury/illness/exacerbation, and 1-2 comorbidities: DM2, HTN  are also affecting patient's functional outcome.    REHAB POTENTIAL: Excellent   CLINICAL DECISION MAKING: Stable/uncomplicated   EVALUATION COMPLEXITY: Low   PLAN: PT FREQUENCY: 1x/week   PT DURATION: 8 weeks   PLANNED INTERVENTIONS: Therapeutic exercises, Therapeutic activity, Neuromuscular re-education, Balance training, Gait training, Patient/Family education, Self Care, Stair training, Vestibular training, and Re-evaluation   PLAN FOR NEXT SESSION:  Modify HEP for left strength and dynamic balance, treadmill or SciFit for endurance, leg press, weighted carry tasks   Bary Richard, PT, DPT 07/25/2022, 9:31 AM

## 2022-07-25 NOTE — Patient Instructions (Signed)
Access Code: [email or web address] URL: [email or web address] Date: 07/25/2022 Prepared by: Camille Bal  Exercises - Seated Hamstring Curls with Resistance  - 1 x daily - 4-5 x weekly - 2 sets - 12 reps - Staggered Sit-to-Stand  - 1 x daily - 4-5 x weekly - 2 sets - 10 reps - Squat with Chair Touch  - 1 x daily - 5 x weekly - 3 sets - 6 reps - Standing Single Leg Stance with Counter Support  - 1 x daily - 3-4 x weekly - 1 sets - 3-4 reps - 30 seconds hold - Lunge with Counter Support  - 1 x daily - 5 x weekly - 2 sets - 8 reps

## 2022-07-26 DIAGNOSIS — F3132 Bipolar disorder, current episode depressed, moderate: Secondary | ICD-10-CM | POA: Diagnosis not present

## 2022-07-26 DIAGNOSIS — N183 Chronic kidney disease, stage 3 unspecified: Secondary | ICD-10-CM | POA: Diagnosis not present

## 2022-07-26 DIAGNOSIS — N1831 Chronic kidney disease, stage 3a: Secondary | ICD-10-CM | POA: Diagnosis not present

## 2022-07-26 DIAGNOSIS — R2689 Other abnormalities of gait and mobility: Secondary | ICD-10-CM | POA: Diagnosis not present

## 2022-07-26 DIAGNOSIS — I10 Essential (primary) hypertension: Secondary | ICD-10-CM | POA: Diagnosis not present

## 2022-07-26 DIAGNOSIS — R269 Unspecified abnormalities of gait and mobility: Secondary | ICD-10-CM | POA: Diagnosis not present

## 2022-07-30 ENCOUNTER — Encounter: Payer: Self-pay | Admitting: Neurology

## 2022-07-31 ENCOUNTER — Ambulatory Visit: Payer: BC Managed Care – PPO | Admitting: Physical Therapy

## 2022-08-02 ENCOUNTER — Encounter: Payer: Self-pay | Admitting: Neurology

## 2022-08-02 ENCOUNTER — Ambulatory Visit (INDEPENDENT_AMBULATORY_CARE_PROVIDER_SITE_OTHER): Payer: BC Managed Care – PPO | Admitting: Neurology

## 2022-08-02 VITALS — BP 140/84 | HR 76 | Ht 70.0 in | Wt 218.0 lb

## 2022-08-02 DIAGNOSIS — R202 Paresthesia of skin: Secondary | ICD-10-CM | POA: Diagnosis not present

## 2022-08-02 DIAGNOSIS — I639 Cerebral infarction, unspecified: Secondary | ICD-10-CM

## 2022-08-02 DIAGNOSIS — Z789 Other specified health status: Secondary | ICD-10-CM | POA: Diagnosis not present

## 2022-08-02 DIAGNOSIS — G4721 Circadian rhythm sleep disorder, delayed sleep phase type: Secondary | ICD-10-CM | POA: Diagnosis not present

## 2022-08-02 DIAGNOSIS — G4733 Obstructive sleep apnea (adult) (pediatric): Secondary | ICD-10-CM

## 2022-08-02 DIAGNOSIS — G4734 Idiopathic sleep related nonobstructive alveolar hypoventilation: Secondary | ICD-10-CM | POA: Insufficient documentation

## 2022-08-02 NOTE — Telephone Encounter (Signed)
Mr Hymas struggled with CPAP and was interested in alternatives , I have ordered another ONO.  He has oxygen at home, should not use it that night on ONO- and if oxygen is fine- he can d/c using it at home- it costs money too.  His CPAP broke in the [place] , it shortened. We discussed using positional therapy only form now on.   Melvyn Novas, MD

## 2022-08-02 NOTE — Progress Notes (Signed)
SLEEP MEDICINE CLINIC  PATIENT: Edwin [name] DOB: [birth year]  REASON FOR VISIT: follow up HISTORY FROM: patient PRIMARY NEUROLOGIST: Dr. Leonie Man  Chief Complaint  Patient presents with   Follow-up    Pt in rm #10. Pt state he has be losing weight in good way, pt is proud. Pt state tingling in left hand has gotten better due to the gabapentin.    HISTORY OF PRESENT ILLNESS: Today 08/02/22:  08-02-2022: RV  with a 66 year old stroke patient who has struggled with CPAP use. He has a mid August HST which indicated an AHI highest in prone sleep. He had been told in hospital that he had hypoxia and was given oxygen by nasal canula- his alarm went off before all night, once 02 was applied, he slept through-  He is worried about further having strokes, as his stroke a happened at night, in his sleep- and got tested by HST.  Calculated pAHI (per hour):    Total AHI was 25.9/h, during REM sleep AHI rose to 38.8/h, in non-REM sleep AHI was 24.2/h.                                                   Positional AHI: The patient slept mostly in prone position associated with an AHI of 24.4 followed by supine position with an AHI of 14 9/h and by left-sided sleep position with an AHI of 2.6/h.   O2 Saturation (minutes) <89%: 17.5 minutes equivalent to 5.2% total sleep time.    RECOMMENDATION: I would recommend positional sleep therapy first, avoiding prone and supine sleep and concentrate on left or right lateral sleep positions.   #2 given the history of fairly recent strokes I would strongly recommend to continue a positive airway pressure therapy.  The patient is not comfortable with CPAP. He is apprehensive and it interferes more with his sleep , whle the oxygen concentrator sound is soothing.  Oxygen was prescribed in hospital ;   The stroke did not leave him with dysphagia or dysphonia and I presume that this indicates that he does not have a lateral component that would vary very much in  terms of AHI.  His home sleep test was done without oxygen and only 17.5 minutes of hypoxia were seen the nadir was not below 88% saturation.  I am not sure why he would need oxygen at night just because it was given in the hospital when he presented there.  He had followed up with an overnight pulse oximetry at home and has by telephone heard about the results. Dr Inda Merlin Sadie [place]er) ordered this test with [facility].  He was told his oxygen saturation didn't drop below 88% (?).  I would not be opposed to d/c oxygen if this was true.   I encourage Edwin [name] to get the results.  We discussed  the positional therapy with tennis ball, body pillow and weight loss.                MM:  Edwin [name] is a 66 year old male who presented to the ED on March 19 with left-sided sensory deficit and difficulty with ambulation.  He did not receive TNKase due to being out of the window.  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 CT head - 02/04/2022 - No acute intracranial process.  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  Continue Plavix on discharge  Patient reports that he is doing well. Continues to have some tingling on the left side of the body.  Feels it down the arm and leg. Did not complete any therapy. Reports that he had blood work through PCP and reports that LDL is better and is no longer Crestor. States that it caused fatigue and muscle pain.   Reports that he is monitoring diet and sees a nutritionist  OSA: was using it but then went on a trip  and the machine adaptor was broken and was not able to get a replacement CPAP. Was seeing Dr. Earl Gala.  States that he struggled using the CPAP.  Reports that he would wake up every hour.  Reports that he tried different mask with no benefit.  Reports that he is interested in inspire but Dr. Earl Gala does not do this.  He has not seen Dr. Earl Gala in the last year.  He feels that his last sleep study was in 2021 he is requesting to see our sleep physicians here to see if he is a candidate for possible inspire device   CLINICAL INFORMATION/HISTORY: Edwin [name] is a 66 y.o. year old White or Caucasian male patient seen here as a referral on 05/24/2022 from  Butch Penny, NP VIA Dr Pearlean Brownie, MD . Chief concern according to patient :  I have seen dr Earl Gala at [place] for several years. Pt referred for sleep consult. Dx with OSA and would like to try different option, like a mouth piece. Pts accidentally burnt/broke his CPAP when travelling, last used 02/2021. Last SS done in 2021 through [facility]. DME [facility] at the time. Had a Resmed CPAP. He lost 50 pounds since his last sleep test 2019. He has voiced interest in dental device or inspire.   He was admitted on 02-04-2022 with left sided numbness and not weakness, diagnosed with a stroke. 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  Balance was off, handwriting was affected. ED report:  Edwin [name] is a 66 y.o. male with medical history significant of DM2, HTN, hypogonadism, bipolar d/o, gout. Presenting with left side numbness. He reports 4 days ago when he woke up, he thought he was having an episode of vertigo. He was dizzy and nauseous. He vomited once. He decided to take the rest of the day off to rest. The next day he was feeling better overall, but not 100%. He had a fall that day where he hit his head. He didn't have any LOS. He saw his PCP and was given meclizine. The next day day he was for the most part ok until he went to sleep. That night prior to sleep, he  felt some tingling on his left side  REVIEW OF SYSTEMS: Out of a complete 14 system review of symptoms, the patient complains only of the following symptoms, and all other reviewed systems are negative.  How likely are you to doze in the following situations: 0 =  not likely, 1 = slight chance, 2 = moderate chance, 3 = high chance  Sitting and Reading? Watching Television? Sitting inactive in a public place (theater or meeting)? Lying down in the afternoon when circumstances permit? Sitting and talking to someone? Sitting quietly after lunch without alcohol? In a car, while stopped for a few minutes in traffic? As a passenger in a car for an hour without a break?  Total = 3/ 24   FSS at 20/ 63 points.   No longer insomnia.   ALLERGIES: Allergies  Allergen Reactions   Allopurinol Other (See Comments)    Made pt emotionally unstable  Other reaction(s): emotionally labile   Penicillins Hives and Other (See Comments)    Has patient had a PCN reaction causing immediate rash, facial/tongue/throat swelling, SOB or lightheadedness with hypotension: No Has patient had a PCN reaction causing severe rash involving mucus membranes or skin necrosis: No Has patient had a PCN reaction that required hospitalization No Has patient had a PCN reaction occurring within the last 10 years: No If all of the above answers are "NO", then may proceed with Cephalosporin use.   Finerenone Other (See Comments)    hyperkalemia Other reaction(s): Increases  potassium levels   Penicillin G     Other reaction(s): hives   Aspirin Hives   Ciprofloxacin Rash    Other reaction(s): rash    HOME MEDICATIONS: Outpatient Medications Prior to Visit  Medication Sig Dispense Refill   amLODipine-atorvastatin (CADUET) 5-10 MG tablet Take 1 tablet by mouth daily.     Cholecalciferol 1000 units TBDP Take 1 tablet by mouth daily.      clopidogrel (PLAVIX) 75 MG tablet Take 1 tablet (75 mg total) by mouth daily. 90  tablet 0   dapagliflozin propanediol (FARXIGA) 10 MG TABS tablet Take 10 mg by mouth daily.     diclofenac Sodium (VOLTAREN) 1 % GEL Apply 2 g topically daily as needed (for knee pain).     divalproex (DEPAKOTE ER) 500 MG 24 hr tablet Take 2 tablets (1,000 mg total) by mouth at bedtime. 180 tablet 1   DULoxetine (CYMBALTA) 60 MG capsule Take 2 capsules (120 mg total) by mouth daily. 180 capsule 1   febuxostat (ULORIC) 40 MG tablet Take 40 mg by mouth daily.     furosemide (LASIX) 20 MG tablet Take 20 mg by mouth daily as needed for edema. May take 2 to 3 days weekly     gabapentin (NEURONTIN) 400 MG capsule Take 1 capsule (400 mg total) by mouth 2 (two) times daily. 60 capsule 5   hydrALAZINE (APRESOLINE) 50 MG tablet Take 1 tablet (50 mg total) by mouth 3 (three) times daily.     lamoTRIgine (LAMICTAL) 200 MG tablet Take 1 tablet (200 mg total) by mouth every morning. 90 tablet 1   LevOCARNitine (L-CARNITINE) 500 MG TABS Take 500 mg by mouth 2 (two) times daily.     losartan (COZAAR) 100 MG tablet Take 100 mg by mouth daily.     losartan (COZAAR) 50 MG tablet Take 2 tablets (100 mg total) by mouth daily.     metFORMIN (GLUCOPHAGE-XR) 500 MG 24 hr tablet Take 500 mg by mouth 2 (two) times daily.     multivitamin-lutein (OCUVITE-LUTEIN) CAPS capsule Take 1 capsule by mouth daily.     pramipexole (MIRAPEX) 0.5 MG tablet Take 1 tablet (0.5 mg total) by mouth 2 (two) times daily. 180 tablet 1   tamsulosin (FLOMAX) 0.4 MG CAPS capsule Take 0.4  mg by mouth daily.     No facility-administered medications prior to visit.    PAST MEDICAL HISTORY: Past Medical History:  Diagnosis Date   Allergic rhinitis    Bipolar disorder ([place])    Bradycardia 2012    due to lithium   CKD (chronic kidney disease) stage 2, GFR 60-89 ml/min    Gout    Hypertension    Mild renal insufficiency    , with creatinine of 1.3 in 2010, was side effect of med   Obesity    ,moderate   Prostatitis    , Episodic    Stroke ([place])    Suicide attempt ([place]) 09/20/2014    PAST SURGICAL HISTORY: Past Surgical History:  Procedure Laterality Date   APPENDECTOMY     CATARACT EXTRACTION Right    COLONOSCOPY WITH PROPOFOL N/A 06/14/2014   Procedure: COLONOSCOPY WITH PROPOFOL;  Surgeon: Garlan Fair, MD;  Location: [facility];  Service: Endoscopy;  Laterality: N/A;   TONSILLECTOMY     UMBILICAL HERNIA REPAIR      FAMILY HISTORY: Family History  Problem Relation Age of Onset   Other Mother        Viral Meningitis   Mitral valve prolapse Mother    Arrhythmia Mother    Hypothyroidism Mother    Stroke Mother    Hypertension Father    Gout Father    Alzheimer's disease Father     SOCIAL HISTORY: Social History   Socioeconomic History   Marital status: Married    Spouse name: Sweet   Number of children: 0   Years of education: Not on file   Highest education level: Master's degree (e.g., MA, MS, MEng, MEd, MSW, MBA)  Occupational History   Not on file  Tobacco Use   Smoking status: Never   Smokeless tobacco: Never  Substance and Sexual Activity   Alcohol use: No   Drug use: No   Sexual activity: Not on file  Other Topics Concern   Not on file  Social History Narrative   Lives alone   Left handed   Caffeine: 2 ice tea a day   Social Determinants of Health   Financial Resource Strain: Low Risk  (02/22/2022)   Overall Financial Resource Strain (CARDIA)    Difficulty of Paying Living Expenses: Not hard at all  Food Insecurity: No Food Insecurity (06/19/2022)   Hunger Vital Sign    Worried About Running Out of Food in the Last Year: Never true    Ran Out of Food in the Last Year: Never true  Transportation Needs: No Transportation Needs (06/19/2022)   PRAPARE - Hydrologist (Medical): No    Lack of Transportation (Non-Medical): No  Physical Activity: Not on file  Stress: No Stress Concern Present (02/22/2022)   [place]    Feeling of Stress : Only a little  Social Connections: Not on file  Intimate Partner Violence: Not At Risk (02/22/2022)   Humiliation, Afraid, Rape, and Kick questionnaire    Fear of Current or Ex-Partner: No    Emotionally Abused: No    Physically Abused: No    Sexually Abused: No      PHYSICAL EXAM  Vitals:   08/02/22 0935  BP: (!) 140/84  Pulse: 76  Weight: 218 lb (98.9 kg)  Height: 5\' 10"  (1.778 m)   Body mass index is 31.28 kg/m.  Generalized:  Neurological examination  Mentation: Alert oriented  to time, place, history taking. Follows all commands speech and language fluent Cranial nerve : Pupils were equal round reactive to light. Extraocular movements were full, visual field were full on confrontational test. Facial sensation were normal.  Facial mild droop on the left - Uvula tongue midline. Head turning and shoulder shrug  were normal and symmetric. Motor: The motor testing reveals 5 over 5 strength of all 4 extremities. Good symmetric motor tone is noted throughout.  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  Gait and station: walks 3-4 miles, turns are fragmented. Romberg is negative. left drift is seen.  Reflexes:    DIAGNOSTIC DATA (LABS, IMAGING, TESTING) - I reviewed patient records, labs, notes, testing and imaging myself where available.  Lab Results  Component Value Date   WBC 5.4 07/01/2022   HGB 13.3 07/01/2022   HCT 38.6 (L) 07/01/2022   MCV 93.2 07/01/2022   PLT 174 07/01/2022      Component Value Date/Time   NA 140 07/01/2022 0352   K 4.1 07/01/2022 0352   CL 108 07/01/2022 0352   CO2 25 07/01/2022 0352   GLUCOSE 125 (H) 07/01/2022 0352   BUN 31 (H) 07/01/2022 0352   CREATININE 1.73 (H) 07/01/2022 0352   CALCIUM 9.3 07/01/2022 0352   PROT 6.1 (L) 07/01/2022 0352   ALBUMIN 3.7 07/01/2022 0352   AST 22 07/01/2022 0352    ALT 19 07/01/2022 0352   ALKPHOS 61 07/01/2022 0352   BILITOT 0.7 07/01/2022 0352   GFRNONAA 44 (L) 07/01/2022 0352   GFRAA >60 02/04/2017 0525   Lab Results  Component Value Date   CHOL 160 07/01/2022   HDL 46 07/01/2022   LDLCALC 105 (H) 07/01/2022   TRIG 45 07/01/2022   CHOLHDL 3.5 07/01/2022   Lab Results  Component Value Date   HGBA1C 4.9 07/01/2022       ASSESSMENT:  I had the pleasure of meeting with Edwin [name] today, 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  Based on his hospital records he suffered hypoxia at night, the oximetry alarm went off almost constantly in his bedroom a nadir of 60% was documented in his hospital notes.  He was discharged on 2 L of oxygen.  His stroke specialist Dr. York Spaniel he sent him for a home sleep test and indeed the home sleep test showed sleep apnea but no significant hypoxia.  I would like to add that his home sleep test was done without oxygen supplement.  Apparently he had a follow-up overnight night pulse oximetry with his primary care internist Dr. Kevan Ny and I gather that this also showed no significant nadir his oxygen stayed in the 88% range.  He feels he slept better on oxygen on the other hand he does not feel that CPAP has benefited him on the contrary he has grown apprehensive of CPAP.  We discussed today the optional positional therapy in order to replace CPAP.  Since I did not order the initial oxygen I am a little hesitant to tell him to discontinue it and I would rather have him use a pulse oximetry while not using oxygen at night to see where his current hypoxia levels may lie.    I will not force him to use CPAP. He can be an inspire candidate by BMI and if hypoxia is improved.  ONO  ordered again, CPAP machine was left in the [place].    Follow-up in 3-4 months or sooner if needed   Larey Seat, MD 08/02/2022, 9:44  AM [facility] [address], [place],  [address] [phone]

## 2022-08-03 ENCOUNTER — Ambulatory Visit: Payer: BC Managed Care – PPO | Admitting: Physical Therapy

## 2022-08-03 ENCOUNTER — Encounter: Payer: Self-pay | Admitting: Physical Therapy

## 2022-08-03 DIAGNOSIS — R2681 Unsteadiness on feet: Secondary | ICD-10-CM

## 2022-08-03 DIAGNOSIS — M6281 Muscle weakness (generalized): Secondary | ICD-10-CM | POA: Diagnosis not present

## 2022-08-03 DIAGNOSIS — R2689 Other abnormalities of gait and mobility: Secondary | ICD-10-CM

## 2022-08-03 DIAGNOSIS — I69354 Hemiplegia and hemiparesis following cerebral infarction affecting left non-dominant side: Secondary | ICD-10-CM | POA: Diagnosis not present

## 2022-08-03 NOTE — Therapy (Signed)
OUTPATIENT PHYSICAL THERAPY TREATMENT NOTE   Patient Name: Edwin [name] MRN: [id number] DOB:1956/01/19, 66 y.o., male Today's Date: 08/03/2022  PCP: Marden Noble, MD REFERRING PROVIDER: Butch Penny, NP   END OF SESSION:   PT End of Session - 08/03/22 0939     Visit Number 3    Number of Visits 9    Date for PT Re-Evaluation 09/21/22    Authorization Type BLUE CROSS BLUE [employer]IELD    PT Start Time [phone]   pt late   PT Stop Time 1016    PT Time Calculation (min) 42 min    Equipment Utilized During Treatment Gait belt    Activity Tolerance Patient tolerated treatment well    Behavior During Therapy WFL for tasks assessed/performed             Past Medical History:  Diagnosis Date   Allergic rhinitis    Bipolar disorder (HCC)    Bradycardia 2012    due to lithium   CKD (chronic kidney disease) stage 2, GFR 60-89 ml/min    Gout    Hypertension    Mild renal insufficiency    , with creatinine of 1.3 in 2010, was side effect of med   Obesity    ,moderate   Prostatitis    , Episodic   Stroke (HCC)    Suicide attempt (HCC) 09/20/2014   Past Surgical History:  Procedure Laterality Date   APPENDECTOMY     CATARACT EXTRACTION Right    COLONOSCOPY WITH PROPOFOL N/A 06/14/2014   Procedure: COLONOSCOPY WITH PROPOFOL;  Surgeon: Charolett Bumpers, MD;  Location: [facility];  Service: Endoscopy;  Laterality: N/A;   TONSILLECTOMY     UMBILICAL HERNIA REPAIR     Patient Active Problem List   Diagnosis Date Noted   Chronic intermittent hypoxia with obstructive sleep apnea 08/02/2022   Intolerance of continuous positive airway pressure (CPAP) ventilation 08/02/2022   Paresthesias 08/02/2022   Night-waking disorder with delayed sleep phase 08/02/2022   Acute CVA (cerebrovascular accident) (HCC) 07/01/2022   Pain in right ankle and joints of right foot 02/22/2022   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HCC) 02/22/2022   Allergic rhinitis 02/22/2022   Bipolar disorder in remission (HCC)  02/22/2022   Body mass index (BMI) 37.0-37.9, adult 02/22/2022   Bradycardia 02/22/2022   Cat scratch of forearm 02/22/2022   Cerebral infarction (HCC) 02/22/2022   Closed fracture of metatarsal bone 02/22/2022   Constipation 02/22/2022   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   Dyslipidemia 02/22/2022   Edema 02/22/2022   Electrocardiogram abnormal 02/22/2022   Elevated PSA 02/22/2022   Enthesopathy 02/22/2022   Erectile dysfunction 02/22/2022   History of small bowel obstruction 02/22/2022   Hyperglycemia 02/22/2022   Hyperglycemia due to type 2 diabetes mellitus (HCC) 02/22/2022   Insomnia 02/22/2022   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   Impairment of balance 02/22/2022   Major depression, single episode 02/22/2022   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   Melena 02/22/2022   Mixed hyperlipidemia 02/22/2022   Obstructive sleep apnea syndrome 02/22/2022   Other long term (current) drug therapy 02/22/2022   Encounter for immunization 02/22/2022   Pneumonia due to SARS-associated coronavirus 02/22/2022   Right lower quadrant pain 02/22/2022   Severe recurrent major depression without psychotic features (HCC) 02/22/2022   Umbilical hernia 02/22/2022   Vitamin D deficiency 02/22/2022   Left-sided weakness 02/04/2022   Diabetes mellitus type 2 in nonobese [facility]) 02/04/2022   CVA (cerebral vascular accident) (HCC) 02/04/2022  Enteritis of ileum 02/02/2017   Intestinal obstruction (HCC) 02/02/2017   Persistent recurrent vomiting 02/01/2017   Chronic kidney disease, stage 3 unspecified ([place]) 05/19/2016   Abdominal pain 05/19/2016   Hypokalemia 05/19/2016   Primary gout    Bipolar affective disorder, currently depressed, moderate ([place]) 09/20/2014   Hypertension 11/23/2013   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 He did not receive TNKase due to being out of the window."  Pt returned to the ED on July 15 w/ Left arm and leg numbness.  He has residual N/T in left arm and leg from prior stroke, but this was marked worsening.  Pt returned to ED on 06/30/2022 w/ worsening of symptoms.  Discharged w/o evidence of recurrence of stroke. Pt use 3LPM nasal cannula at home only when sleeping, states PCP prescribed this when he left hospital as he could not get CPAP.  He did the home sleep study to see if he can get another CPAP. No hearing or vision issues. 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  PRECAUTIONS: Fall  SUBJECTIVE: He states he walked 4.5 miles without realizing it.  He now has a large blister on the medial side of his big toe.  He has not done much of his HEP and would like to review the second exercise.  PAIN:  Are you having pain? No    OBJECTIVE: (objective measures completed at initial evaluation unless otherwise dated)   TODAY'S TREATMENT:  -Reviewed staggered STS x10 each LE in rear -Assessed squat form x10 using hands on back of chair > Squats 2x10 w/ 10# kettlebell -Countertop lunges each LE 2x20 -Leg press 70# bilaterally x20 > 40# unilateral press 2x15 each LE -SciFit x8 mins in hill mode on level 8.0 for HIIT style training for cardiovascular endurance and strengthening using BUE/BLE.  No step goal set, total steps achieved 627.  PATIENT EDUCATION: Education details: Initial HEP. Person educated: Patient Education method: Explanation Education comprehension:  verbalized understanding   HOME EXERCISE PROGRAM: Access Code: [email or web address] URL: [email or web address] Date: 07/25/2022 Prepared by: Elease Etienne  Exercises - Seated Hamstring Curls with Resistance  - 1 x daily - 4-5 x weekly - 2 sets - 12 reps - Staggered Sit-to-Stand  - 1 x daily - 4-5 x weekly - 2 sets - 10 reps - Squat with Chair Touch  - 1 x daily - 5 x weekly - 3 sets - 6 reps - Standing Single Leg Stance with Counter Support  - 1 x daily - 3-4 x weekly - 1 sets - 3-4 reps - 30 seconds hold - Lunge with Counter Support  - 1 x daily - 5 x weekly - 2 sets - 8 reps   GOALS: Goals reviewed with patient? Yes   SHORT TERM GOALS: Target date: 08/10/2022   Pt will be independent with strength and balance HEP. Baseline:  To be established. Goal status: INITIAL   2.  Pt will demonstrate a gait speed of >/=3.0 feet/sec in order to  decrease risk for falls. Baseline: 2.44 ft/sec Goal status: INITIAL   3.  Pt will decrease 5xSTS to </=13 seconds without use of UE support in order to demonstrate decreased risk for falls and improved functional bilateral LE strength and power. Baseline: 13.51 sec w/ BUE support Goal status: INITIAL   4.  Pt will improve FGA score to >/=18/30 in order to demonstrate improved balance and decreased fall risk. Baseline:  13/30 Goal status: INITIAL   LONG TERM GOALS: Target date: 09/07/2022   Pt will ambulate independently >/=500 feet across various surfaces using improve symmetical gait and stance on LLE w/o AD to promote household and community access. Baseline: various clinic distances w/ dec L stance Goal status: INITIAL   2.  Pt will improve FGA score to >/=23/30 in order to demonstrate improved balance and decreased fall risk. Baseline: 13/30 Goal status: INITIAL   3.   Pt will demonstrate improved proximal LLE strength to improve overall gait mechanics and dynamic balance. Baseline: LLE proximally grossly 4/5 Goal status:  INITIAL   ASSESSMENT:   CLINICAL IMPRESSION: Pt does well with progression of lower extremity strengthening and demonstrates good kinesthetic awareness during high level strengthening tasks such as the leg press and lunging.  At present he remains motivated and active outside the clinic and is making adequate progress towards goals.  Will assess short term goals at next session to further assess progress vs remaining limitations.   OBJECTIVE IMPAIRMENTS Abnormal gait, decreased activity tolerance, decreased balance, decreased strength, increased edema, impaired sensation, and postural dysfunction.    ACTIVITY LIMITATIONS locomotion level   PARTICIPATION LIMITATIONS: community activity and occupation   PERSONAL FACTORS Age, Time since onset of injury/illness/exacerbation, and 1-2 comorbidities: DM2, HTN  are also affecting patient's functional outcome.    REHAB POTENTIAL: Excellent   CLINICAL DECISION MAKING: Stable/uncomplicated   EVALUATION COMPLEXITY: Low   PLAN: PT FREQUENCY: 1x/week   PT DURATION: 8 weeks   PLANNED INTERVENTIONS: Therapeutic exercises, Therapeutic activity, Neuromuscular re-education, Balance training, Gait training, Patient/Family education, Self Care, Stair training, Vestibular training, and Re-evaluation   PLAN FOR NEXT SESSION:  Assess STGs!  Modify HEP prn for left strength and dynamic balance, treadmill or SciFit for endurance, leg press, weighted carry tasks   Sadie Haber, PT, DPT 08/03/2022, 10:16 AM

## 2022-08-06 ENCOUNTER — Ambulatory Visit: Payer: BC Managed Care – PPO | Admitting: Adult Health

## 2022-08-06 ENCOUNTER — Ambulatory Visit: Payer: BC Managed Care – PPO | Admitting: Physical Therapy

## 2022-08-06 ENCOUNTER — Encounter: Payer: Self-pay | Admitting: Physical Therapy

## 2022-08-06 DIAGNOSIS — R2689 Other abnormalities of gait and mobility: Secondary | ICD-10-CM | POA: Diagnosis not present

## 2022-08-06 DIAGNOSIS — R2681 Unsteadiness on feet: Secondary | ICD-10-CM | POA: Diagnosis not present

## 2022-08-06 DIAGNOSIS — M6281 Muscle weakness (generalized): Secondary | ICD-10-CM | POA: Diagnosis not present

## 2022-08-06 DIAGNOSIS — I69354 Hemiplegia and hemiparesis following cerebral infarction affecting left non-dominant side: Secondary | ICD-10-CM | POA: Diagnosis not present

## 2022-08-06 NOTE — Patient Instructions (Signed)
Access Code: [email or web address] URL: [email or web address] Date: 08/06/2022 Prepared by: Elease Etienne  Exercises - Seated Hamstring Curls with Resistance  - 1 x daily - 4-5 x weekly - 2 sets - 12 reps - Staggered Sit-to-Stand  - 1 x daily - 4-5 x weekly - 2 sets - 10 reps - Squat with Chair Touch  - 1 x daily - 5 x weekly - 3 sets - 6 reps - Standing Single Leg Stance with Counter Support  - 1 x daily - 3-4 x weekly - 1 sets - 3-4 reps - 30 seconds hold - Lunge with Counter Support  - 1 x daily - 5 x weekly - 2 sets - 8 reps - Sit to Stand with Arms Crossed  - 1 x daily - 5 x weekly - 3 sets - 5 reps - Tandem Walking with Counter Support  - 1 x daily - 7 x weekly - 3 sets - 10 reps - Backward Tandem Walking with Counter Support  - 1 x daily - 7 x weekly - 3 sets - 10 reps

## 2022-08-06 NOTE — Therapy (Signed)
OUTPATIENT PHYSICAL THERAPY TREATMENT NOTE   Patient Name: Edwin [name] MRN: [id number] DOB:Jun 19, 1956, 66 y.o., male Today's Date: 08/06/2022  PCP: Josetta Huddle, MD REFERRING PROVIDER: Ward Givens, NP   END OF SESSION:   PT End of Session - 08/06/22 1321     Visit Number 4    Number of Visits 9    Date for PT Re-Evaluation 09/21/22    Authorization Type BLUE CROSS BLUE [employer]IELD    PT Start Time 4917   pt late   PT Stop Time 1356    PT Time Calculation (min) 39 min    Equipment Utilized During Treatment Gait belt    Activity Tolerance Patient tolerated treatment well    Behavior During Therapy WFL for tasks assessed/performed             Past Medical History:  Diagnosis Date   Allergic rhinitis    Bipolar disorder ([place])    Bradycardia 2012    due to lithium   CKD (chronic kidney disease) stage 2, GFR 60-89 ml/min    Gout    Hypertension    Mild renal insufficiency    , with creatinine of 1.3 in 2010, was side effect of med   Obesity    ,moderate   Prostatitis    , Episodic   Stroke ([place])    Suicide attempt ([place]) 09/20/2014   Past Surgical History:  Procedure Laterality Date   APPENDECTOMY     CATARACT EXTRACTION Right    COLONOSCOPY WITH PROPOFOL N/A 06/14/2014   Procedure: COLONOSCOPY WITH PROPOFOL;  Surgeon: Garlan Fair, MD;  Location: [facility];  Service: Endoscopy;  Laterality: N/A;   TONSILLECTOMY     UMBILICAL HERNIA REPAIR     Patient Active Problem List   Diagnosis Date Noted   Chronic intermittent hypoxia with obstructive sleep apnea 08/02/2022   Intolerance of continuous positive airway pressure (CPAP) ventilation 08/02/2022   Paresthesias 08/02/2022   Night-waking disorder with delayed sleep phase 08/02/2022   Acute CVA (cerebrovascular accident) ([place]) 07/01/2022   Pain in right ankle and joints of right foot 02/22/2022   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[place]) 02/22/2022   Allergic rhinitis 02/22/2022   Bipolar disorder in remission ([place])  02/22/2022   Body mass index (BMI) 37.0-37.9, adult 02/22/2022   Bradycardia 02/22/2022   Cat scratch of forearm 02/22/2022   Cerebral infarction ([place]) 02/22/2022   Closed fracture of metatarsal bone 02/22/2022   Constipation 02/22/2022   Diabetic renal disease ([place]) 02/22/2022   Dyslipidemia 02/22/2022   Edema 02/22/2022   Electrocardiogram abnormal 02/22/2022   Elevated PSA 02/22/2022   Enthesopathy 02/22/2022   Erectile dysfunction 02/22/2022   History of small bowel obstruction 02/22/2022   Hyperglycemia 02/22/2022   Hyperglycemia due to type 2 diabetes mellitus ([place]) 02/22/2022   Insomnia 02/22/2022   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   Impairment of balance 02/22/2022   Major depression, single episode 02/22/2022   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   Melena 02/22/2022   Mixed hyperlipidemia 02/22/2022   Obstructive sleep apnea syndrome 02/22/2022   Other long term (current) drug therapy 02/22/2022   Encounter for immunization 02/22/2022   Pneumonia due to SARS-associated coronavirus 02/22/2022   Right lower quadrant pain 02/22/2022   Severe recurrent major depression without psychotic features ([place]) 91/50/5697   Umbilical hernia 94/80/1655   Vitamin D deficiency 02/22/2022   Left-sided weakness 02/04/2022   Diabetes mellitus type 2 in nonobese [facility]) 02/04/2022   CVA (cerebral vascular accident) ([place]) 02/04/2022  Enteritis of ileum 02/02/2017   Intestinal obstruction (HCC) 02/02/2017   Persistent recurrent vomiting 02/01/2017   Chronic kidney disease, stage 3 unspecified ([place]) 05/19/2016   Abdominal pain 05/19/2016   Hypokalemia 05/19/2016   Primary gout    Bipolar affective disorder, currently depressed, moderate ([place]) 09/20/2014   Hypertension 11/23/2013   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 He did not receive TNKase due to being out of the window."  Pt returned to the ED on July 15 w/ Left arm and leg numbness.  He has residual N/T in left arm and leg from prior stroke, but this was marked worsening.  Pt returned to ED on 06/30/2022 w/ worsening of symptoms.  Discharged w/o evidence of recurrence of stroke. Pt use 3LPM nasal cannula at home only when sleeping, states PCP prescribed this when he left hospital as he could not get CPAP.  He did the home sleep study to see if he can get another CPAP. No hearing or vision issues. 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  PRECAUTIONS: Fall  SUBJECTIVE: He walked 4 miles yesterday.  He is having more left hand paresthesia today.    PAIN:  Are you having pain? No    OBJECTIVE: (objective measures completed at initial evaluation unless otherwise dated)   TODAY'S TREATMENT:  LTG assessment: -Verbally reviewed HEP.  Pt is walking avg of 4 miles several days a week. -10MWT:  9.91 sec = 1.01 m/sec OR 3.33 ft/sec -5xSTS no UE support:  15.53 sec - encouraged pt to perform at home for functional strengthening -Assessed FGA:  [facility] PT Assessment - 08/06/22 1330       Functional Gait  Assessment   Gait assessed  Yes    Gait Level Surface Walks 20 ft in less than 5.5 sec, no assistive devices, good speed, no evidence for imbalance, normal gait pattern, deviates no more than 6 in outside of the 12 in walkway width.    Change in Gait Speed Able to smoothly change walking speed without loss of balance or gait deviation.  Deviate no more than 6 in outside of the 12 in walkway width.    Gait with Horizontal Head Turns Performs head turns smoothly with slight change in gait velocity (eg, minor disruption to smooth gait path), deviates 6-10 in outside 12 in walkway width, or uses an assistive device.    Gait with Vertical Head Turns Performs head turns with no change in gait. Deviates no more than 6 in outside 12 in walkway width.    Gait and Pivot Turn Pivot turns safely within 3 sec and stops quickly with no loss of balance.    Step Over Obstacle Is able to step over 2 stacked shoe boxes taped together (9 in total height) without changing gait speed. No evidence of imbalance.    Gait with Narrow Base of Support Ambulates 4-7 steps.    Gait with Eyes Closed Walks 20 ft, slow speed, abnormal gait pattern, evidence for imbalance, deviates  10-15 in outside 12 in walkway width. Requires more than 9 sec to ambulate 20 ft.    Ambulating Backwards Walks 20 ft, uses assistive device, slower speed, mild gait deviations, deviates 6-10 in outside 12 in walkway width.    Steps Alternating feet, no rail.    Total Score 24            Practiced tandem walking fwd/bckwd at countertop STS no UE 2x5; mild knee discomfort SciFit level 3.0 x8 minutes using BUE/BLE for neural priming and cardiovascular endurance.  Step goal of >/=95 with  total steps.  RPE 7/10 throughout activity.  PATIENT EDUCATION: Education details: Continue HEP w/ additions and walking as able. Person educated: Patient Education method: Explanation Education comprehension: verbalized understanding   HOME EXERCISE PROGRAM: Access Code: [email or web address] URL: [email or web address] Date: 07/25/2022 Prepared by: Elease Etienne  Exercises - Seated Hamstring Curls with Resistance  - 1 x daily - 4-5 x weekly - 2 sets - 12 reps - Staggered Sit-to-Stand  - 1 x daily - 4-5 x weekly - 2 sets - 10 reps - Squat with Chair Touch  - 1 x daily - 5 x weekly -  3 sets - 6 reps - Standing Single Leg Stance with Counter Support  - 1 x daily - 3-4 x weekly - 1 sets - 3-4 reps - 30 seconds hold - Lunge with Counter Support  - 1 x daily - 5 x weekly - 2 sets - 8 reps  - Sit to Stand with Arms Crossed  - 1 x daily - 5 x weekly - 3 sets - 5 reps - Tandem Walking with Counter Support  - 1 x daily - 7 x weekly - 3 sets - 10 reps - Backward Tandem Walking with Counter Support  - 1 x daily - 7 x weekly - 3 sets - 10 reps   GOALS: Goals reviewed with patient? Yes   SHORT TERM GOALS: Target date: 08/10/2022   Pt will be independent with strength and balance HEP. Baseline:  Established, pt compliant, would benefit from advancements. Goal status: MET   2.  Pt will demonstrate a gait speed of >/=3.0 feet/sec in order to decrease risk for falls. Baseline: 2.44 ft/sec; 08/06/2022 3.29f/sec Goal status: MET   3.  Pt will decrease 5xSTS to </=13 seconds without use of UE support in order to demonstrate decreased risk for falls and improved functional bilateral LE strength and power. Baseline: 13.51 sec w/ BUE support; 08/06/2022 15.53 sec Goal status: NOT MET   4.  Pt will improve FGA score to >/=18/30 in order to demonstrate improved balance and decreased fall risk. Baseline:  13/30; 08/06/2022 24/30 Goal status: MET   LONG TERM GOALS: Target date: 09/07/2022   Pt will ambulate independently >/=500 feet across various surfaces using improve symmetical gait and stance on LLE w/o AD to promote household and community access. Baseline: various clinic distances w/ dec L stance Goal status: INITIAL   2.  Pt will improve FGA score to >/=26/30 in order to demonstrate improved balance and decreased fall risk. Baseline: 13/30; 08/06/2022 24/30 Goal status: REVISED   3.   Pt will demonstrate improved proximal LLE strength to improve overall gait mechanics and dynamic balance. Baseline: LLE proximally grossly 4/5 Goal status: INITIAL   ASSESSMENT:   CLINICAL  IMPRESSION: STGs assessed this session with pt meeting all but 1 of 4 goals.  His FGA score significantly improved to 24/30 decreasing his fall risk category.  He is able  to complete the 5xSTS assessment without need for UE support in less than 16 seconds indicating improved LE strength despite being just shy of goal level.  His gait speed is normal for age and diagnosis at 3.33 ft/sec significantly improved from evaluation.  Made simple modifications to HEP to further address strength and balance deficits.  He continues to benefit from skilled PT to provide high level balance challenges.   OBJECTIVE IMPAIRMENTS Abnormal gait, decreased activity tolerance, decreased balance, decreased strength, increased edema, impaired sensation, and postural dysfunction.    ACTIVITY LIMITATIONS locomotion level   PARTICIPATION LIMITATIONS: community activity and occupation   PERSONAL FACTORS Age, Time since onset of injury/illness/exacerbation, and 1-2 comorbidities: DM2, HTN  are also affecting patient's functional outcome.    REHAB POTENTIAL: Excellent   CLINICAL DECISION MAKING: Stable/uncomplicated   EVALUATION COMPLEXITY: Low   PLAN: PT FREQUENCY: 1x/week   PT DURATION: 8 weeks   PLANNED INTERVENTIONS: Therapeutic exercises, Therapeutic activity, Neuromuscular re-education, Balance training, Gait training, Patient/Family education, Self Care, Stair training, Vestibular training, and Re-evaluation   PLAN FOR NEXT SESSION:  Modify HEP prn for left strength and dynamic balance, treadmill or SciFit for endurance, leg press, weighted carry tasks, tandem balance, step ups for LLE   Bary Richard, PT, DPT 08/06/2022, 1:57 PM

## 2022-08-10 ENCOUNTER — Encounter: Payer: Self-pay | Admitting: Adult Health

## 2022-08-13 ENCOUNTER — Encounter: Payer: Self-pay | Admitting: Physical Therapy

## 2022-08-13 ENCOUNTER — Ambulatory Visit: Payer: BC Managed Care – PPO | Admitting: Physical Therapy

## 2022-08-13 VITALS — BP 139/87 | HR 60

## 2022-08-13 DIAGNOSIS — M6281 Muscle weakness (generalized): Secondary | ICD-10-CM

## 2022-08-13 DIAGNOSIS — I69354 Hemiplegia and hemiparesis following cerebral infarction affecting left non-dominant side: Secondary | ICD-10-CM

## 2022-08-13 DIAGNOSIS — R2689 Other abnormalities of gait and mobility: Secondary | ICD-10-CM

## 2022-08-13 DIAGNOSIS — R2681 Unsteadiness on feet: Secondary | ICD-10-CM

## 2022-08-13 NOTE — Therapy (Signed)
[facility] [address] [place], [place], [address] Phone: [phone]   Fax:  [phone]  Patient Details  Name: Edwin [name] MRN: [id number] Date of Birth: [birth year] Referring Provider:  Josetta Huddle, MD  Encounter Date: 08/13/2022 ARRIVED-NO CHARGE  Pt endorsing lack of sleep due to worsening paresthesias which he has spoke via MyChart to provider about.  He states these kept him from sleeping and he feels pretty bad today.  States he is willing to do low level activity laying down today, but feels he may fall asleep.  PT discontinued session w/ edu to go home and rest.  Pt is stuffy and unsure if he is getting sick.  Further edu to talk to provider if he needs care regarding sickness and that paresthesias can intermittently get worse with illness.  PT treatment not completed today.  Bary Richard, PT, DPT 08/13/2022, 10:06 AM  [facility] [address] [place], [place], [address] Phone: [phone]   Fax:  [phone]

## 2022-08-17 ENCOUNTER — Other Ambulatory Visit: Payer: Self-pay | Admitting: Psychiatry

## 2022-08-17 ENCOUNTER — Other Ambulatory Visit: Payer: Self-pay | Admitting: Adult Health

## 2022-08-17 DIAGNOSIS — G4734 Idiopathic sleep related nonobstructive alveolar hypoventilation: Secondary | ICD-10-CM | POA: Diagnosis not present

## 2022-08-17 DIAGNOSIS — F4310 Post-traumatic stress disorder, unspecified: Secondary | ICD-10-CM

## 2022-08-20 ENCOUNTER — Encounter: Payer: Self-pay | Admitting: Physical Therapy

## 2022-08-20 ENCOUNTER — Ambulatory Visit: Payer: BC Managed Care – PPO | Attending: Adult Health | Admitting: Physical Therapy

## 2022-08-20 DIAGNOSIS — R2689 Other abnormalities of gait and mobility: Secondary | ICD-10-CM | POA: Insufficient documentation

## 2022-08-20 DIAGNOSIS — M6281 Muscle weakness (generalized): Secondary | ICD-10-CM | POA: Insufficient documentation

## 2022-08-20 DIAGNOSIS — I69354 Hemiplegia and hemiparesis following cerebral infarction affecting left non-dominant side: Secondary | ICD-10-CM | POA: Diagnosis not present

## 2022-08-20 DIAGNOSIS — R2681 Unsteadiness on feet: Secondary | ICD-10-CM | POA: Diagnosis not present

## 2022-08-20 NOTE — Patient Instructions (Signed)
Access Code: [email or web address] URL: [email or web address] Date: 08/20/2022 Prepared by: Elease Etienne  Exercises - Squat with Chair Touch  - 1 x daily - 5 x weekly - 3 sets - 6 reps - Standing Single Leg Stance with Counter Support  - 1 x daily - 3-4 x weekly - 1 sets - 3-4 reps - 30 seconds hold - Tandem Walking with Counter Support  - 1 x daily - 7 x weekly - 3 sets - 10 reps - Backward Tandem Walking with Counter Support  - 1 x daily - 7 x weekly - 3 sets - 10 reps - Seated [place] with Resistance  - 1 x daily - 7 x weekly - 3 sets - 10 reps - 1-2 seconds hold - Standing Tandem Balance with Counter Support  - 1 x daily - 7 x weekly - 1 sets - 4 reps - 30 seconds hold

## 2022-08-20 NOTE — Therapy (Signed)
OUTPATIENT PHYSICAL THERAPY TREATMENT NOTE/DISCHARGE SUMMARY   Patient Name: Edwin [name] MRN: [id number] DOB:May 19, 1956, 66 y.o., male Today's Date: 08/20/2022  PCP: Josetta Huddle, MD REFERRING PROVIDER: Ward Givens, NP   PHYSICAL THERAPY DISCHARGE SUMMARY  Visits from Start of Care: 5  Current functional level related to goals / functional outcomes: See clinical impression statement.   Remaining deficits: Impaired left sensation   Education / Equipment: Plan for discharge per pt request and goal progress.  Continue walking and modifications to HEP.   Patient agrees to discharge. Patient goals were met. Patient is being discharged due to meeting the stated rehab goals.   END OF SESSION:   PT End of Session - 08/20/22 0933     Visit Number 5    Number of Visits 9    Date for PT Re-Evaluation 09/21/22    Authorization Type [employer]    PT Start Time 0930    PT Stop Time 1009    PT Time Calculation (min) 39 min    Equipment Utilized During Treatment Gait belt    Activity Tolerance Patient tolerated treatment well    Behavior During Therapy WFL for tasks assessed/performed             Past Medical History:  Diagnosis Date   Allergic rhinitis    Bipolar disorder ([place])    Bradycardia 2012    due to lithium   CKD (chronic kidney disease) stage 2, GFR 60-89 ml/min    Gout    Hypertension    Mild renal insufficiency    , with creatinine of 1.3 in 2010, was side effect of med   Obesity    ,moderate   Prostatitis    , Episodic   Stroke ([place])    Suicide attempt ([place]) 09/20/2014   Past Surgical History:  Procedure Laterality Date   APPENDECTOMY     CATARACT EXTRACTION Right    COLONOSCOPY WITH PROPOFOL N/A 06/14/2014   Procedure: COLONOSCOPY WITH PROPOFOL;  Surgeon: Garlan Fair, MD;  Location: [facility];  Service: Endoscopy;  Laterality: N/A;   TONSILLECTOMY     UMBILICAL HERNIA REPAIR     Patient Active Problem List   Diagnosis  Date Noted   Chronic intermittent hypoxia with obstructive sleep apnea 08/02/2022   Intolerance of continuous positive airway pressure (CPAP) ventilation 08/02/2022   Paresthesias 08/02/2022   Night-waking disorder with delayed sleep phase 08/02/2022   Acute CVA (cerebrovascular accident) ([place]) 07/01/2022   Pain in right ankle and joints of right foot 02/22/2022   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[place]) 02/22/2022   Allergic rhinitis 02/22/2022   Bipolar disorder in remission ([place]) 02/22/2022   Body mass index (BMI) 37.0-37.9, adult 02/22/2022   Bradycardia 02/22/2022   Cat scratch of forearm 02/22/2022   Cerebral infarction ([place]) 02/22/2022   Closed fracture of metatarsal bone 02/22/2022   Constipation 02/22/2022   Diabetic renal disease ([place]) 02/22/2022   Dyslipidemia 02/22/2022   Edema 02/22/2022   Electrocardiogram abnormal 02/22/2022   Elevated PSA 02/22/2022   Enthesopathy 02/22/2022   Erectile dysfunction 02/22/2022   History of small bowel obstruction 02/22/2022   Hyperglycemia 02/22/2022   Hyperglycemia due to type 2 diabetes mellitus ([place]) 02/22/2022   Insomnia 02/22/2022   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   Impairment of balance 02/22/2022   Major depression, single episode 02/22/2022   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   Melena 02/22/2022   Mixed hyperlipidemia 02/22/2022   Obstructive sleep apnea syndrome 02/22/2022   Other  long term (current) drug therapy 02/22/2022   Encounter for immunization 02/22/2022   Pneumonia due to SARS-associated coronavirus 02/22/2022   Right lower quadrant pain 02/22/2022   Severe recurrent major depression without psychotic features ([place]) 40/37/0964   Umbilical hernia 38/38/1840   Vitamin D deficiency 02/22/2022   Left-sided weakness 02/04/2022   Diabetes mellitus type 2 in nonobese ([place]) 02/04/2022   CVA (cerebral vascular accident) ([place]) 02/04/2022   Enteritis of ileum 02/02/2017   Intestinal  obstruction ([place]) 02/02/2017   Persistent recurrent vomiting 02/01/2017   Chronic kidney disease, stage 3 unspecified ([place]) 05/19/2016   Abdominal pain 05/19/2016   Hypokalemia 05/19/2016   Primary gout    Bipolar affective disorder, currently depressed, moderate ([place]) 09/20/2014   Hypertension 11/23/2013   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 He did not receive TNKase due to being out of the window."  Pt returned to the ED on July 15 w/ Left arm and leg numbness.  He has residual N/T in left arm and leg from prior stroke, but this was marked worsening.  Pt returned to ED on 06/30/2022 w/ worsening of symptoms.  Discharged w/o evidence of recurrence of stroke. Pt use 3LPM nasal cannula at home only when sleeping, states PCP prescribed this when he left hospital as he could not get CPAP.  He did the home sleep study to see if he can get another CPAP. No hearing or vision issues. 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  PRECAUTIONS: Fall  SUBJECTIVE: He walked 5.5 miles on sidewalk around town yesterday, his goal was only 4 and he just ended up walking farther.  He is continuing to lose weight.  He feels he can tell he has muscle building up.  He is feeling much better this visit.   PAIN:  Are you having pain? No    OBJECTIVE: (objective measures completed at initial evaluation unless otherwise dated)   TODAY'S TREATMENT:  -Bil leg  press 2x12 at 100# Pt stating he feels he has met his goals and does not feel he needs to continue PT unless PT does, discussed assessment of goals with pt in agreement. -Assessed BLE MMT: Left hip flexor 4-/5, right hip flexor 4/5; all else grossly 5/5 distally -Assessed FGA:  [facility] PT Assessment - 08/20/22 0949       Functional Gait  Assessment   Gait assessed  Yes    Gait Level Surface Walks 20 ft in less than 5.5 sec, no assistive devices, good speed, no evidence for imbalance, normal gait pattern, deviates no more than 6 in outside of the 12 in walkway width.    Change in Gait Speed Able to smoothly change walking speed without loss of balance or gait deviation. Deviate no more than 6 in outside of the 12 in walkway width.    Gait with Horizontal Head Turns Performs head turns smoothly with no change in gait. Deviates no more than 6 in outside 12 in walkway width    Gait with  Vertical Head Turns Performs head turns with no change in gait. Deviates no more than 6 in outside 12 in walkway width.    Gait and Pivot Turn Pivot turns safely within 3 sec and stops quickly with no loss of balance.    Step Over Obstacle Is able to step over 2 stacked shoe boxes taped together (9 in total height) without changing gait speed. No evidence of imbalance.    Gait with Narrow Base of Support Ambulates 4-7 steps.    Gait with Eyes Closed Walks 20 ft, uses assistive device, slower speed, mild gait deviations, deviates 6-10 in outside 12 in walkway width. Ambulates 20 ft in less than 9 sec but greater than 7 sec.    Ambulating Backwards Walks 20 ft, uses assistive device, slower speed, mild gait deviations, deviates 6-10 in outside 12 in walkway width.    Steps Alternating feet, no rail.    Total Score 26    FGA comment: Low fall risk            Reviewed and modified HEP.  See below for details.    PATIENT EDUCATION: Education details: Continue HEP w/ additions and walking as able.  Assessment  outcomes.  Process for obtaining new referral to return if he feels he has new therapy needs. Person educated: Patient Education method: Explanation Education comprehension: verbalized understanding   HOME EXERCISE PROGRAM: Access Code: [email or web address] URL: [email or web address] Date: 08/20/2022 Prepared by: Elease Etienne  Exercises - Squat with Chair Touch  - 1 x daily - 5 x weekly - 3 sets - 6 reps - Standing Single Leg Stance with Counter Support  - 1 x daily - 3-4 x weekly - 1 sets - 3-4 reps - 30 seconds hold - Tandem Walking with Counter Support  - 1 x daily - 7 x weekly - 3 sets - 10 reps - Backward Tandem Walking with Counter Support  - 1 x daily - 7 x weekly - 3 sets - 10 reps - Seated March with Resistance  - 1 x daily - 7 x weekly - 3 sets - 10 reps - 1-2 seconds hold - Standing Tandem Balance with Counter Support  - 1 x daily - 7 x weekly - 1 sets - 4 reps - 30 seconds hold   GOALS: Goals reviewed with patient? Yes   SHORT TERM GOALS: Target date: 08/10/2022   Pt will be independent with strength and balance HEP. Baseline:  Established, pt compliant, would benefit from advancements. Goal status: MET   2.  Pt will demonstrate a gait speed of >/=3.0 feet/sec in order to decrease risk for falls. Baseline: 2.44 ft/sec; 08/06/2022 3.4f/sec Goal status: MET   3.  Pt will decrease 5xSTS to </=13 seconds without use of UE support in order to demonstrate decreased risk for falls and improved functional bilateral LE strength and power. Baseline: 13.51 sec w/ BUE support; 08/06/2022 15.53 sec Goal status: NOT MET   4.  Pt will improve FGA score to >/=18/30 in order to demonstrate improved balance and decreased fall risk. Baseline:  13/30; 08/06/2022 24/30 Goal status: MET   LONG TERM GOALS: Target date: 09/07/2022   Pt will ambulate independently >/=500 feet across various surfaces using improve symmetical gait and stance on LLE w/o AD to promote household and  community access. Baseline: various clinic distances w/ dec L stance; pt is ambulating 4-5.5 miles regularly in community at independent level  Goal status: MET   2.  Pt will improve FGA  score to >/=26/30 in order to demonstrate improved balance and decreased fall risk. Baseline: 13/30; 08/06/2022 24/30; 08/20/2022 26/30 Goal status: MET   3.   Pt will demonstrate improved proximal LLE strength to improve overall gait mechanics and dynamic balance. Baseline: LLE proximally grossly 4/5; 08/20/2022 grossly 5/5 except at hip flexors Goal status: MET   ASSESSMENT:   CLINICAL IMPRESSION: Session initiated with gait training when patient expressed feeling he had met all of his goals.  Upon assessment, he met 3 of 3 long term goals.  He is categorized as a low fall risk based on a score of 26/30 on the FGA.  He has returned to walking several miles over varying terrain in his community and his BLE strength has greatly improved except at the hip flexors which remain weakest.  Modified HEP to address this.  Overall, he is at a much improved functional level and is prepared for discharge to continue pursuing preferred activities and self-management at home.     OBJECTIVE IMPAIRMENTS Abnormal gait, decreased activity tolerance, decreased balance, decreased strength, increased edema, impaired sensation, and postural dysfunction.    ACTIVITY LIMITATIONS locomotion level   PARTICIPATION LIMITATIONS: community activity and occupation   PERSONAL FACTORS Age, Time since onset of injury/illness/exacerbation, and 1-2 comorbidities: DM2, HTN  are also affecting patient's functional outcome.    REHAB POTENTIAL: Excellent   CLINICAL DECISION MAKING: Stable/uncomplicated   EVALUATION COMPLEXITY: Low   PLAN: PT FREQUENCY: 1x/week   PT DURATION: 8 weeks   PLANNED INTERVENTIONS: Therapeutic exercises, Therapeutic activity, Neuromuscular re-education, Balance training, Gait training, Patient/Family education,  Self Care, Stair training, Vestibular training, and Re-evaluation   PLAN FOR NEXT SESSION:  Modify HEP prn for left strength and dynamic balance, treadmill or SciFit for endurance, leg press, weighted carry tasks, tandem balance, step ups for LLE   Bary Richard, PT, DPT 08/20/2022, 10:13 AM

## 2022-08-21 ENCOUNTER — Ambulatory Visit: Payer: BC Managed Care – PPO | Admitting: Neurology

## 2022-08-22 DIAGNOSIS — Z79899 Other long term (current) drug therapy: Secondary | ICD-10-CM | POA: Diagnosis not present

## 2022-08-22 DIAGNOSIS — M1009 Idiopathic gout, multiple sites: Secondary | ICD-10-CM | POA: Diagnosis not present

## 2022-08-22 DIAGNOSIS — R739 Hyperglycemia, unspecified: Secondary | ICD-10-CM | POA: Diagnosis not present

## 2022-08-22 DIAGNOSIS — M1991 Primary osteoarthritis, unspecified site: Secondary | ICD-10-CM | POA: Diagnosis not present

## 2022-08-27 ENCOUNTER — Ambulatory Visit: Payer: BC Managed Care – PPO | Admitting: Physical Therapy

## 2022-08-28 ENCOUNTER — Other Ambulatory Visit: Payer: Self-pay | Admitting: Neurology

## 2022-08-28 ENCOUNTER — Encounter: Payer: Self-pay | Admitting: Neurology

## 2022-08-28 DIAGNOSIS — Z789 Other specified health status: Secondary | ICD-10-CM

## 2022-08-28 DIAGNOSIS — G4733 Obstructive sleep apnea (adult) (pediatric): Secondary | ICD-10-CM

## 2022-08-28 DIAGNOSIS — I639 Cerebral infarction, unspecified: Secondary | ICD-10-CM

## 2022-09-03 ENCOUNTER — Ambulatory Visit: Payer: BC Managed Care – PPO | Admitting: Physical Therapy

## 2022-09-03 DIAGNOSIS — N1831 Chronic kidney disease, stage 3a: Secondary | ICD-10-CM | POA: Diagnosis not present

## 2022-09-04 ENCOUNTER — Ambulatory Visit: Payer: BC Managed Care – PPO | Admitting: Adult Health

## 2022-09-04 ENCOUNTER — Telehealth: Payer: Self-pay | Admitting: Adult Health

## 2022-09-10 ENCOUNTER — Ambulatory Visit: Payer: BC Managed Care – PPO | Admitting: Physical Therapy

## 2022-09-10 ENCOUNTER — Encounter: Payer: Self-pay | Admitting: Neurology

## 2022-09-16 DIAGNOSIS — G4734 Idiopathic sleep related nonobstructive alveolar hypoventilation: Secondary | ICD-10-CM | POA: Diagnosis not present

## 2022-09-17 ENCOUNTER — Encounter: Payer: Self-pay | Admitting: Psychiatry

## 2022-09-17 ENCOUNTER — Ambulatory Visit (INDEPENDENT_AMBULATORY_CARE_PROVIDER_SITE_OTHER): Payer: BC Managed Care – PPO | Admitting: Psychiatry

## 2022-09-17 DIAGNOSIS — R748 Abnormal levels of other serum enzymes: Secondary | ICD-10-CM | POA: Diagnosis not present

## 2022-09-17 DIAGNOSIS — F3181 Bipolar II disorder: Secondary | ICD-10-CM

## 2022-09-17 DIAGNOSIS — F4310 Post-traumatic stress disorder, unspecified: Secondary | ICD-10-CM | POA: Diagnosis not present

## 2022-09-17 DIAGNOSIS — G4733 Obstructive sleep apnea (adult) (pediatric): Secondary | ICD-10-CM

## 2022-09-17 NOTE — Progress Notes (Signed)
Edwin [name] [id number] 03-19-1956 66 y.o.  Subjective:   Patient ID:  Edwin [name] is a 66 y.o. (DOB 1956-03-27) male.  Chief Complaint:  Chief Complaint  Patient presents with   Follow-up    Bipolar II disorder ([place])   Post-Traumatic Stress Disorder   Other    bipolar   Stress    health    Depression        Associated symptoms include no decreased concentration and no suicidal ideas.  Edwin [name] presents to the office today for follow-up of bipolar depression and anxiety.  seen July 02, 2019.  No meds were changed.  Patient was doing well.  He asked me to write a letter to his attorney and support his intention to marry a woman from overseas and that his mental health was sufficiently well that he would not represent a danger to his fiance.  This was discussed with his attorney and the letter was written.  Met girl June 8. Met her on dating site international.  [place] woman and decided wanted to get married.  Options opening up after being shut down by Covid.  seen November 2020.  No meds were changed.  He was overall doing well.  seen January 20, 2020 .  No med changed.  Following noted. Wonders about elevated liver enzymes and Depakote.  Labs not visible in Epic.  PCP Dr. Gavin [name] is working up this finding.   Doing great from mental health.  Talks daily to GF in [place].    03/23/20 the following is noted:    Taken on too many students.  Recognizes needs to have 2 days off a week and plans to schedule that.  Needs to get through end of the semester.  Fiance visa is difficult but she just got admitted to [facility] pending some exams being passed.  Visa difficult with Covid affecting travel.  She could be here in mid-July. No med changes  07/06/20 with the following noted: Still good. Pramipexole has kept depression at [place]. Mood is still stable. Good interest in basketball. Still concerned about weight and blames meds and wonders about weight loss meds and  supplements.  Asked about L-glutamine taken it for a week.  Trying to watch diet.  Hasn't weighed himself. Plan: No med changes indicated yet.  Sig risk in switching his primary mood stabilizer unless its absolutely nececessary.  10/05/20 appt noted: Consistent with meds.  Still doing great overall.  Some trouble sleeping and got help with CPAP with Dr. Maxwell [name].  Tendency to sleep 3 hours and awakened.  May stay awake 1-2 hours and eventually get sufficient sleep.  Can awaken refreshed. No SE except weight gain.   Got denied for Ozempic.  Very concerned about weight gain with Depakote. Still facetiming with GF in [place] and no visa for her yet bc country still shut down. Plan no med changes  01/05/2021 appointment with the following noted: Expressed concerns about weight and new dx DM.   Disc retrying efforts to get Ozempic.   Has increase glucose.   Been on phone with GF in [place] for 20 mos.  It's dragging on trying to get her here.  Thinking about going there.   Previously lost the love of his life Edwin [name] his illness.  Sense of loss bc her birthday was a couple of days ago.    05/03/2021 appointment with the following noted: Doing as well as anyone can be doing.  Flew to [place] in April.  People were nice.  Enjoyed the family and went with 27 people to the beach.  GF comes from family of 9.  Going back in July and wedding July 30.  Had Face Timed for 22 mos and gotten to know the family.  She has not been to [place] yet but they plan to live here.  Spousal visas are back logged.   Has seen PCP and dietician and is losing weight. Plan: No med changes indicated yet.  Sig risk in switchin g his primary mood stabilizer unless its absolutely nececessary. Failed attempt to reduce pramipexole below 0.5 mg BID  08/30/21 appt noted: Pretty well.  Got married but she's not here yet.  Working on getting her into the [place].  Has a good immigration attorney here.  Usually takes a just a couple of mos but  maybe longer  Edwin [name] backlog.   Still taking meds Depakote 1000, duloxetine 120, lamotrigine 200, pramipexole 0.5 mg BID.   Stopped soda.  Working with nutritionist and down 35#. 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   Disc psychology and masculinity goals. No SE.   Satisfied with meds and no indication to change. Plan: continue Depakote 1000, duloxetine 120, lamotrigine 200, pramipexole 0.5 mg BID.   Failed attempt to reduce pramipexole below 0.5 mg BID  01/01/22 appt noted: Going to [employer].   Very good mental health.   New wife is delayed getting here bc slow immigration.  She's in [place] right now.   Got married in [place].   OK with meds. CKD stable. Plan: No med changes indicated yet.  Sig risk in switching his primary mood stabilizer unless its absolutely nececessary. Depakote 1000, duloxetine 120, lamotrigine 200, pramipexole 0.5 mg BID.    05/01/2022 appointment with the following noted: March 15 dizzy and dx with stroke.  Thinks it might be related to untreated OSA bc CPAP machine burned up and couldn't get another.  O2 levels low in hospital in sleep. Appt  to see sleep doc soon.  Sleeping with O2 now.   No depression occurred.  Handled it well.  Lost 52# this year. Thankful for meds.   Sees Dr. Maureen [name] 7/6 Applied for expedited Visa for wife. Plan: No med changes indicated.  Sig risk in switching his primary mood stabilizer unless its absolutely nececessary. Depakote 1000, duloxetine 120, lamotrigine 200, pramipexole 0.5 mg BID.  Gabapentin 400 BID Failed attempt to reduce pramipexole below 0.5 mg BID  09/17/22 appt noted: 06/30/22 ED stroke  then had rehab. Stroke in March mild but not again in 8/23.  Slowly better. Walking 2-4 miles daily and it has helped. Enjoys [employer] sports Patient reports stable mood and denies depressed or irritable moods.   Patient denies any recent difficulty with anxiety.  Patient denies difficulty with sleep initiation  or maintenance.  Not Using CPAP some issues getting O2.. Sleep hygiene is better now and that' s helped.  Going to bed earlier.   Denies appetite disturbance.  Patient reports that energy and motivation have been good.  Patient denies any difficulty with concentration.  Patient denies any suicidal ideation. Mood good wihtout swings or depression.   Satisfied with meds. No alcohol.    M died 07/24/19.  Was a relief for her.    Past Psychiatric Medication Trials: Lithium increased Cr and switch to VPA 2012,  Depakote 1000, lamotrigine,  gabapentin, Vraylar tremor, Rexulti SE, Abilify increased weight,   Wellbutrin NR, phentermine,   Pramipexole 0.5  mg BID L-Carnitine  Psych hosp 1986 helpful   Review of Systems:  Review of Systems  Constitutional:  Negative for unexpected weight change.  Cardiovascular:  Negative for palpitations.  Musculoskeletal:  Positive for arthralgias.  Neurological:  Positive for weakness. Negative for tremors.  Psychiatric/Behavioral:  Negative for agitation, behavioral problems, confusion, decreased concentration, hallucinations, self-injury, sleep disturbance and suicidal ideas. The patient is not hyperactive.   No longer depressed.   Medications: I have reviewed the patient's current medications.  Current Outpatient Medications  Medication Sig Dispense Refill   amLODipine-atorvastatin (CADUET) 5-10 MG tablet Take 1 tablet by mouth daily.     Cholecalciferol 1000 units TBDP Take 1 tablet by mouth daily.      clopidogrel (PLAVIX) 75 MG tablet Take 1 tablet (75 mg total) by mouth daily. 90 tablet 0   dapagliflozin propanediol (FARXIGA) 10 MG TABS tablet Take 10 mg by mouth daily.     diclofenac Sodium (VOLTAREN) 1 % GEL Apply 2 g topically daily as needed (for knee pain).     divalproex (DEPAKOTE ER) 500 MG 24 hr tablet Take 2 tablets (1,000 mg total) by mouth at bedtime. 180 tablet 1   DULoxetine (CYMBALTA) 60 MG capsule Take 2 capsules (120 mg total)  by mouth daily. 180 capsule 1   febuxostat (ULORIC) 40 MG tablet Take 40 mg by mouth daily.     furosemide (LASIX) 20 MG tablet Take 20 mg by mouth daily as needed for edema. May take 2 to 3 days weekly     gabapentin (NEURONTIN) 400 MG capsule Take 1 capsule (400 mg total) by mouth 2 (two) times daily. 60 capsule 5   glucosamine-chondroitin 500-400 MG tablet Take 1 tablet by mouth 1 day or 1 dose.     hydrALAZINE (APRESOLINE) 50 MG tablet Take 1 tablet (50 mg total) by mouth 3 (three) times daily.     lamoTRIgine (LAMICTAL) 200 MG tablet Take 1 tablet (200 mg total) by mouth every morning. 90 tablet 1   LevOCARNitine (L-CARNITINE) 500 MG TABS Take 500 mg by mouth 2 (two) times daily.     losartan (COZAAR) 100 MG tablet Take 100 mg by mouth daily.     losartan (COZAAR) 50 MG tablet Take 2 tablets (100 mg total) by mouth daily.     metFORMIN (GLUCOPHAGE-XR) 500 MG 24 hr tablet Take 500 mg by mouth 2 (two) times daily.     multivitamin-lutein (OCUVITE-LUTEIN) CAPS capsule Take 1 capsule by mouth daily.     pramipexole (MIRAPEX) 0.5 MG tablet Take 1 tablet (0.5 mg total) by mouth 2 (two) times daily. 180 tablet 1   tamsulosin (FLOMAX) 0.4 MG CAPS capsule Take 0.4 mg by mouth daily.     No current facility-administered medications for this visit.    Medication Side Effects: Other: weight gain.  Allergies:  Allergies  Allergen Reactions   Allopurinol Other (See Comments)    Made pt emotionally unstable  Other reaction(s): emotionally labile   Penicillins Hives and Other (See Comments)    Has patient had a PCN reaction causing immediate rash, facial/tongue/throat swelling, SOB or lightheadedness with hypotension: No Has patient had a PCN reaction causing severe rash involving mucus membranes or skin necrosis: No Has patient had a PCN reaction that required hospitalization No Has patient had a PCN reaction occurring within the last 10 years: No If all of the above answers are "NO", then may  proceed with Cephalosporin use.   Finerenone Other (See Comments)  hyperkalemia Other reaction(s): Increases  potassium levels   Penicillin G     Other reaction(s): hives   Aspirin Hives   Ciprofloxacin Rash    Other reaction(s): rash    Past Medical History:  Diagnosis Date   Allergic rhinitis    Bipolar disorder ([place])    Bradycardia 2012    due to lithium   CKD (chronic kidney disease) stage 2, GFR 60-89 ml/min    Gout    Hypertension    Mild renal insufficiency    , with creatinine of 1.3 in 2010, was side effect of med   Obesity    ,moderate   Prostatitis    , Episodic   Stroke ([place])    Suicide attempt ([place]) 09/20/2014    Family History  Problem Relation Age of Onset   Other Mother        Viral Meningitis   Mitral valve prolapse Mother    Arrhythmia Mother    Hypothyroidism Mother    Stroke Mother    Hypertension Father    Gout Father    Alzheimer's disease Father     Social History   Socioeconomic History   Marital status: Married    Spouse name: Sweet   Number of children: 0   Years of education: Not on file   Highest education level: Master's degree (e.g., MA, MS, MEng, MEd, MSW, MBA)  Occupational History   Not on file  Tobacco Use   Smoking status: Never   Smokeless tobacco: Never  Substance and Sexual Activity   Alcohol use: No   Drug use: No   Sexual activity: Not on file  Other Topics Concern   Not on file  Social History Narrative   Lives alone   Left handed   Caffeine: 2 ice tea a day   Social Determinants of Health   Financial Resource Strain: Low Risk  (02/22/2022)   Overall Financial Resource Strain (CARDIA)    Difficulty of Paying Living Expenses: Not hard at all  Food Insecurity: No Food Insecurity (06/19/2022)   Hunger Vital Sign    Worried About Running Out of Food in the Last Year: Never true    Ran Out of Food in the Last Year: Never true  Transportation Needs: No Transportation Needs (06/19/2022)   PRAPARE -  Hydrologist (Medical): No    Lack of Transportation (Non-Medical): No  Physical Activity: Not on file  Stress: No Stress Concern Present (02/22/2022)   [place]    Feeling of Stress : Only a little  Social Connections: Not on file  Intimate Partner Violence: Not At Risk (02/22/2022)   Humiliation, Afraid, Rape, and Kick questionnaire    Fear of Current or Ex-Partner: No    Emotionally Abused: No    Physically Abused: No    Sexually Abused: No    Past Medical History, Surgical history, Social history, and Family history were reviewed and updated as appropriate.   Please see review of systems for further details on the patient's review from today.   Objective:   Physical Exam:  There were no vitals taken for this visit.  Physical Exam Constitutional:      General: He is not in acute distress.    Appearance: He is well-developed.  Musculoskeletal:        General: No deformity.  Neurological:     Mental Status: He is alert and oriented to person, place, and time.  Motor: No tremor.     Coordination: Coordination normal.     Gait: Gait normal.  Psychiatric:        Attention and Perception: Attention normal. He is attentive.        Mood and Affect: Mood is not anxious or depressed. Affect is not labile, blunt, angry or inappropriate.        Speech: Speech is not rapid and pressured.        Behavior: Behavior normal.        Thought Content: Thought content normal. Thought content is not delusional. Thought content does not include homicidal or suicidal ideation. Thought content does not include suicidal plan.        Cognition and Memory: Cognition normal.        Judgment: Judgment normal.     Comments: Insight is good.  Some situational anxiety managed Talkative and pleasant.     Lab Review:     Component Value Date/Time   NA 140 07/01/2022 0352   K 4.1 07/01/2022 0352   CL  108 07/01/2022 0352   CO2 25 07/01/2022 0352   GLUCOSE 125 (H) 07/01/2022 0352   BUN 31 (H) 07/01/2022 0352   CREATININE 1.73 (H) 07/01/2022 0352   CALCIUM 9.3 07/01/2022 0352   PROT 6.1 (L) 07/01/2022 0352   ALBUMIN 3.7 07/01/2022 0352   AST 22 07/01/2022 0352   ALT 19 07/01/2022 0352   ALKPHOS 61 07/01/2022 0352   BILITOT 0.7 07/01/2022 0352   GFRNONAA 44 (L) 07/01/2022 0352   GFRAA >60 02/04/2017 0525       Component Value Date/Time   WBC 5.4 07/01/2022 0352   RBC 4.14 (L) 07/01/2022 0352   HGB 13.3 07/01/2022 0352   HCT 38.6 (L) 07/01/2022 0352   PLT 174 07/01/2022 0352   MCV 93.2 07/01/2022 0352   MCH 32.1 07/01/2022 0352   MCHC 34.5 07/01/2022 0352   RDW 11.9 07/01/2022 0352   LYMPHSABS 1.5 06/30/2022 1630   MONOABS 0.7 06/30/2022 1630   EOSABS 0.2 06/30/2022 1630   BASOSABS 0.0 06/30/2022 1630    No results found for: "POCLITH", "LITHIUM"   Lab Results  Component Value Date   VALPROATE 35 (L) 07/01/2022    Sleep study dx severe OSA AHI 53.   .res Assessment: Plan:    Ananda was seen today for follow-up, post-traumatic stress disorder, other and stress.  Diagnoses and all orders for this visit:  Bipolar II disorder ([place])  PTSD (post-traumatic stress disorder)  Obstructive sleep apnea  Elevated liver enzymes  Greater than 50% of 30 min face to face time with patient was spent on counseling and coordination of care. We discussed  Mood remains stable . Switch from lithium to VPA in 2012 was followed up with CMP which was normal at the time except for elevated ammonia level.  Which was the reason for adding L-carnitine and this corrected the ammonia level.   Elevated liver enzymes resolved.  Supportive therapy dealing with his goal to get new wife to [place]  Good response to the med combination.  Polypharmacy necessary.   Since 2017 on pramipexole has been much better with regard to mood stability.  He feels it causes weight gain but cannot maintain  stability bc of it.  Disc weight loss strategies.  He's seeing dietician.  He's lost 18#.  Again Disc Ozempic as potential for weight loss and now the newer similar med.   Gave JAMA abstract at previous visit.[name]   Disc Wegovy.  Disc weight losss at length.   Disc diet and need to control blood sugar and weight loss are all needed.  Using O2 consistently and he requires less sleep.  Disc sleep hygiene and getting adequate quantity for mental health.  Sleep deprivation increases risk mania.   No med changes indicated.  Sig risk in switching his primary mood stabilizer unless its absolutely nececessary. Depakote 1000, duloxetine 120, lamotrigine 200, pramipexole 0.5 mg BID.  Still on gabapentin 400 mg BID  Failed attempt to reduce pramipexole below 0.5 mg BID Disc risk impulsivity and mania with this but he's had depression benefit without problems.  Constipation management 1.  Lots of water 2.  Powdered fiber supplement such as MiraLAX, Citrucel, etc. preferably with a meal 3.  2 stool softeners a day 4.  Milk of magnesia or magnesium tablets if needed   30 min  FU 4 mos  Lynder Parents, MD, DFAPA   Please see After Visit Summary for patient specific instructions.  Future Appointments  Date Time Provider [place]  10/02/2022  9:00 AM Ward Givens, NP [facility] None     No orders of the defined types were placed in this encounter.      -------------------------------

## 2022-09-17 NOTE — Patient Instructions (Signed)
Constipation management 1.  Lots of water 2.  Powdered fiber supplement such as MiraLAX, Citrucel, etc. preferably with a meal 3.  2 stool softeners a day 4.  Milk of magnesia or magnesium tablets if needed  

## 2022-09-20 DIAGNOSIS — G4730 Sleep apnea, unspecified: Secondary | ICD-10-CM | POA: Diagnosis not present

## 2022-09-20 DIAGNOSIS — R0683 Snoring: Secondary | ICD-10-CM | POA: Diagnosis not present

## 2022-09-20 NOTE — Telephone Encounter (Signed)
Received a notification that the patient has received the device

## 2022-09-24 ENCOUNTER — Telehealth: Payer: Self-pay | Admitting: Neurology

## 2022-09-24 NOTE — Telephone Encounter (Signed)
I reviewed patient's pulse oximetry report.  He had a total test time of little over 3 hours, average oxygen saturation was 90% only, nadir was 74% on room air. Time below or at 88% saturation was just above 1 hour.  Please advise patient that he should follow-up with primary care regarding the need for supplemental oxygen at night. If need be, he can consult with a pulmonary specialist to see if there is an underlying lung disease, which may justify supplemental oxygen.

## 2022-09-24 NOTE — Telephone Encounter (Signed)
Pt completed a ONO on room air 11/2. There was a total of 3 hours and 16 min recorded and out of that time it was a recorded 1 hour and 24 min and 40 seconds where the oxygen level was below 89%. Dr Brett Fairy is out of the office, I will sent to Dr Rexene Alberts to review the information and result for the patient.

## 2022-10-02 ENCOUNTER — Ambulatory Visit (INDEPENDENT_AMBULATORY_CARE_PROVIDER_SITE_OTHER): Payer: BC Managed Care – PPO | Admitting: Adult Health

## 2022-10-02 ENCOUNTER — Encounter: Payer: Self-pay | Admitting: Adult Health

## 2022-10-02 VITALS — BP 119/67 | HR 55 | Ht 70.0 in | Wt 220.0 lb

## 2022-10-02 DIAGNOSIS — R202 Paresthesia of skin: Secondary | ICD-10-CM

## 2022-10-02 DIAGNOSIS — I639 Cerebral infarction, unspecified: Secondary | ICD-10-CM | POA: Diagnosis not present

## 2022-10-02 NOTE — Patient Instructions (Signed)
Ok to stay on gabapentin but could switch over to lyrica 50 mg twice a day.  Ok to speak with nephrologist first  If your symptoms worsen or you develop new symptoms please let us know.

## 2022-10-02 NOTE — Progress Notes (Signed)
PATIENT: Edwin [name] DOB: 1956/04/25  REASON FOR VISIT: follow up HISTORY FROM: patient PRIMARY NEUROLOGIST: Dr. Leonie [name]  Chief Complaint  Patient presents with   Follow-up    Pt in Pt here for stroke f/u  Pt states some tingling in left hand Pt states not using CPAP anymore     HISTORY OF PRESENT ILLNESS: Today 10/02/22: [name] is a 66 year old male with a history of stroke with residual paresthesias in the left upper extremity.  He returns today for follow-up.  He is currently on gabapentin taking 400 mg twice a day. Tingling down the arm comes and goes. Reports tingling in the back of the left leg as well. Doesn't notice when he is busy but bothersome at other times.  Symptoms were not there prior to his stroke but has remained since history.  He returns today for evaluation   06/20/22: [name] is a 66 year old male with a history of stroke with residual paresthesia.  He returns today for follow-up. Patient Edwin [name] to the ED on July 15 with left arm and leg numbness.  The patient had residual numbness in the left arm and leg from his prior stroke but felt that it is gotten worse.  Work-up in the ED was relatively unremarkable.  He was started on Topamax for paresthesias but feels that this made it worse.  He returns today to discuss another medication to try. He describes it has pins and needles sensation in the left arm and leg- not as much numbness. He teaches piano.   Reports that he does feel that is balance is off. Tends to veer to the left when ambulating. Feels like he is going to fall but fortunately has not had any falls.  Reports that when he was completing physical therapy he did find it beneficial.  Would like to have another round of physical therapy.  03/22/22: [name] is a 66 year old male who presented to the ED on March 19 with left-sided sensory deficit and difficulty with ambulation.  He did not receive TNKase due to being out of the window.  Stroke: Small acute  infarct in the Right Medullary Pyramid likely due to small vessel disease CT head - 02/02/2022 - No acute intracranial process. CT head - 02/04/2022 - No acute intracranial process.  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  Continue Plavix on discharge  Patient reports that he is doing well. Continues to have some tingling on the left side of the body.  Feels it down the arm and leg. Did not complete any therapy. Reports that he had blood work through PCP and reports that LDL is better and is no longer Crestor. States that it caused fatigue and muscle pain.   Reports that he is monitoring diet and sees a nutritionist  OSA: was using it but then went on a trip and the machine adaptor was broken and was not able to get a replacement CPAP. Was seeing Dr. Maxwell [name].  States that he struggled using the CPAP.  Reports that he would wake up every hour.  Reports that he tried different mask with no  benefit.  Reports that he is interested in inspire but Dr. Earl [name] does not do this.  He has not seen Dr. Earl [name] in the last year.  He feels that his last sleep study was in 2021 he is requesting to see our sleep physicians here to see if he is a candidate for possible inspire device   REVIEW OF SYSTEMS: Out of a complete 14 system review of symptoms, the patient complains only of the following symptoms, and all other reviewed systems are negative.  ALLERGIES: Allergies  Allergen Reactions   Allopurinol Other (See Comments)    Made pt emotionally unstable  Other reaction(s): emotionally labile   Penicillins Hives and Other (See Comments)    Has patient had a PCN  reaction causing immediate rash, facial/tongue/throat swelling, SOB or lightheadedness with hypotension: No Has patient had a PCN reaction causing severe rash involving mucus membranes or skin necrosis: No Has patient had a PCN reaction that required hospitalization No Has patient had a PCN reaction occurring within the last 10 years: No If all of the above answers are "NO", then may proceed with Cephalosporin use.   Finerenone Other (See Comments)    hyperkalemia Other reaction(s): Increases  potassium levels   Penicillin G     Other reaction(s): hives   Aspirin Hives   Ciprofloxacin Rash    Other reaction(s): rash    HOME MEDICATIONS: Outpatient Medications Prior to Visit  Medication Sig Dispense Refill   amLODipine-atorvastatin (CADUET) 5-10 MG tablet Take 1 tablet by mouth daily.     Cholecalciferol 1000 units TBDP Take 1 tablet by mouth daily.      clopidogrel (PLAVIX) 75 MG tablet Take 1 tablet (75 mg total) by mouth daily. 90 tablet 0   dapagliflozin propanediol (FARXIGA) 10 MG TABS tablet Take 10 mg by mouth daily.     diclofenac Sodium (VOLTAREN) 1 % GEL Apply 2 g topically daily as needed (for knee pain).     divalproex (DEPAKOTE ER) 500 MG 24 hr tablet Take 2 tablets (1,000 mg total) by mouth at bedtime. 180 tablet 1   DULoxetine (CYMBALTA) 60 MG capsule Take 2 capsules (120 mg total) by mouth daily. 180 capsule 1   febuxostat (ULORIC) 40 MG tablet Take 40 mg by mouth daily.     furosemide (LASIX) 20 MG tablet Take 20 mg by mouth daily as needed for edema. May take 2 to 3 days weekly     gabapentin (NEURONTIN) 400 MG capsule Take 1 capsule (400 mg total) by mouth 2 (two) times daily. 60 capsule 5   glucosamine-chondroitin 500-400 MG tablet Take 1 tablet by mouth 1 day or 1 dose.     hydrALAZINE (APRESOLINE) 50 MG tablet Take 1 tablet (50 mg total) by mouth 3 (three) times daily.     lamoTRIgine (LAMICTAL) 200 MG tablet Take 1 tablet (200 mg total) by mouth every morning. 90  tablet 1   LevOCARNitine (L-CARNITINE) 500 MG TABS Take 500 mg by mouth 2 (two) times daily.     losartan (COZAAR) 100 MG tablet Take 100 mg by mouth daily.     losartan (COZAAR) 50 MG tablet Take 2 tablets (100 mg total) by mouth daily.     metFORMIN (GLUCOPHAGE-XR) 500 MG 24 hr tablet Take 500 mg by mouth 2 (two) times daily.     multivitamin-lutein (OCUVITE-LUTEIN) CAPS capsule Take 1 capsule by mouth daily.     pramipexole (MIRAPEX) 0.5 MG tablet Take 1 tablet (0.5 mg total) by mouth  2 (two) times daily. 180 tablet 1   tamsulosin (FLOMAX) 0.4 MG CAPS capsule Take 0.4 mg by mouth daily.     No facility-administered medications prior to visit.    PAST MEDICAL HISTORY: Past Medical History:  Diagnosis Date   Allergic rhinitis    Bipolar disorder ([place])    Bradycardia 2012    due to lithium   CKD (chronic kidney disease) stage 2, GFR 60-89 ml/min    Gout    Hypertension    Mild renal insufficiency    , with creatinine of 1.3 in 2010, was side effect of med   Obesity    ,moderate   Prostatitis    , Episodic   Stroke ([place])    Suicide attempt ([place]) 09/20/2014    PAST SURGICAL HISTORY: Past Surgical History:  Procedure Laterality Date   APPENDECTOMY     CATARACT EXTRACTION Right    COLONOSCOPY WITH PROPOFOL N/A 06/14/2014   Procedure: COLONOSCOPY WITH PROPOFOL;  Surgeon: Garlan Fair, MD;  Location: [facility];  Service: Endoscopy;  Laterality: N/A;   TONSILLECTOMY     UMBILICAL HERNIA REPAIR      FAMILY HISTORY: Family History  Problem Relation Age of Onset   Other Mother        Viral Meningitis   Mitral valve prolapse Mother    Arrhythmia Mother    Hypothyroidism Mother    Stroke Mother    Hypertension Father    Gout Father    Alzheimer's disease Father     SOCIAL HISTORY: Social History   Socioeconomic History   Marital status: Married    Spouse name: Sweet   Number of children: 0   Years of education: Not on file   Highest education level: Master's  degree (e.g., MA, MS, MEng, MEd, MSW, MBA)  Occupational History   Not on file  Tobacco Use   Smoking status: Never   Smokeless tobacco: Never  Substance and Sexual Activity   Alcohol use: No   Drug use: No   Sexual activity: Not on file  Other Topics Concern   Not on file  Social History Narrative   Lives alone   Left handed   Caffeine: 2 ice tea a day   Social Determinants of Health   Financial Resource Strain: Low Risk  (02/22/2022)   Overall Financial Resource Strain (CARDIA)    Difficulty of Paying Living Expenses: Not hard at all  Food Insecurity: No Food Insecurity (06/19/2022)   Hunger Vital Sign    Worried About Running Out of Food in the Last Year: Never true    Ran Out of Food in the Last Year: Never true  Transportation Needs: No Transportation Needs (06/19/2022)   PRAPARE - Hydrologist (Medical): No    Lack of Transportation (Non-Medical): No  Physical Activity: Not on file  Stress: No Stress Concern Present (02/22/2022)   Jay    Feeling of Stress : Only a little  Social Connections: Not on file  Intimate Partner Violence: Not At Risk (02/22/2022)   Humiliation, Afraid, Rape, and Kick questionnaire    Fear of Current or Ex-Partner: No    Emotionally Abused: No    Physically Abused: No    Sexually Abused: No      PHYSICAL EXAM  Vitals:   10/02/22 0853  BP: 119/67  Pulse: (!) 55  Weight: 220 lb (99.8 kg)  Height: 5\' 10"  (1.778 m)  Body mass index is 31.57 kg/m.  Generalized: Well developed, in no acute distress   Neurological examination  Mentation: Alert oriented to time, place, history taking. Follows all commands speech and language fluent Cranial nerve II-XII: Pupils were equal round reactive to light. Extraocular movements were full, visual field were full on confrontational test. Facial sensation and strength were normal.  Head turning and  shoulder shrug  were normal and symmetric. Motor: The motor testing reveals 5 over 5 strength of all 4 extremities. Good symmetric motor tone is noted throughout.  Sensory: Sensory testing is intact to soft touch on all 4 extremities. No evidence of extinction is noted.  Coordination: Cerebellar testing reveals good finger-nose-finger and heel-to-shin bilaterally.  Gait and station: Gait is slow and cautious.  Tandem gait not attempted.     DIAGNOSTIC DATA (LABS, IMAGING, TESTING) - I reviewed patient records, labs, notes, testing and imaging myself where available.  Lab Results  Component Value Date   WBC 5.4 07/01/2022   HGB 13.3 07/01/2022   HCT 38.6 (L) 07/01/2022   MCV 93.2 07/01/2022   PLT 174 07/01/2022      Component Value Date/Time   NA 140 07/01/2022 0352   K 4.1 07/01/2022 0352   CL 108 07/01/2022 0352   CO2 25 07/01/2022 0352   GLUCOSE 125 (H) 07/01/2022 0352   BUN 31 (H) 07/01/2022 0352   CREATININE 1.73 (H) 07/01/2022 0352   CALCIUM 9.3 07/01/2022 0352   PROT 6.1 (L) 07/01/2022 0352   ALBUMIN 3.7 07/01/2022 0352   AST 22 07/01/2022 0352   ALT 19 07/01/2022 0352   ALKPHOS 61 07/01/2022 0352   BILITOT 0.7 07/01/2022 0352   GFRNONAA 44 (L) 07/01/2022 0352   GFRAA >60 02/04/2017 0525   Lab Results  Component Value Date   CHOL 160 07/01/2022   HDL 46 07/01/2022   LDLCALC 105 (H) 07/01/2022   TRIG 45 07/01/2022   CHOLHDL 3.5 07/01/2022   Lab Results  Component Value Date   HGBA1C 4.9 07/01/2022       ASSESSMENT AND PLAN 66 y.o. year old male  has a past medical history of Allergic rhinitis, Bipolar disorder ([place]), Bradycardia (2012), CKD (chronic kidney disease) stage 2, GFR 60-89 ml/min, Gout, Hypertension, Mild renal insufficiency, Obesity, Prostatitis, Stroke ([place]), and Suicide attempt ([place]) (09/20/2014). here with:  Paraesthesias from Stroke  -Continue gabapentin 400 mg twice a day- elevated creatinine level. CRCL 60.  -Discussed potentially  switching over to Lyrica 50 mg twice a day.  He plans to discuss with his nephrologist first and will send [place] a MyChart message if he wants to switch -Advised if symptoms worsen or he develops new symptoms he should let us know  2. Stroke: Small acute infarct in the Right Medullary Pyramid likely due to small vessel disease   - Continue Plavix - Maintain strict control of HTN, cholesterol and diabetes - BP goal <130/90- managed by PCP - Cholesterol LDL goal <70  - Diabetes HgbA1c goal <6.5 %    Follow-up in 6 months or sooner if needed    Ward Givens, MSN, NP-C 10/02/2022, 9:04 AM [facility] [address], [place], [place] [address] [phone]

## 2022-10-03 DIAGNOSIS — N1831 Chronic kidney disease, stage 3a: Secondary | ICD-10-CM | POA: Diagnosis not present

## 2022-10-07 ENCOUNTER — Encounter: Payer: Self-pay | Admitting: Adult Health

## 2022-10-09 ENCOUNTER — Ambulatory Visit: Payer: BC Managed Care – PPO | Admitting: Adult Health

## 2022-10-09 DIAGNOSIS — E1122 Type 2 diabetes mellitus with diabetic chronic kidney disease: Secondary | ICD-10-CM | POA: Diagnosis not present

## 2022-10-09 DIAGNOSIS — I129 Hypertensive chronic kidney disease with stage 1 through stage 4 chronic kidney disease, or unspecified chronic kidney disease: Secondary | ICD-10-CM | POA: Diagnosis not present

## 2022-10-09 DIAGNOSIS — N183 Chronic kidney disease, stage 3 unspecified: Secondary | ICD-10-CM | POA: Diagnosis not present

## 2022-10-09 DIAGNOSIS — D631 Anemia in chronic kidney disease: Secondary | ICD-10-CM | POA: Diagnosis not present

## 2022-10-09 MED ORDER — PREGABALIN 50 MG PO CAPS: 50.0000 mg | ORAL_CAPSULE | Freq: Two times a day (BID) | ORAL | 5 refills | Status: DC

## 2022-10-09 NOTE — Addendum Note (Signed)
Addended by: Enedina Finner on: 10/09/2022 09:28 AM   Modules accepted: Orders

## 2022-10-17 ENCOUNTER — Encounter: Payer: Self-pay | Admitting: Adult Health

## 2022-10-17 DIAGNOSIS — G4734 Idiopathic sleep related nonobstructive alveolar hypoventilation: Secondary | ICD-10-CM | POA: Diagnosis not present

## 2022-10-18 MED ORDER — GABAPENTIN 400 MG PO CAPS: 400.0000 mg | ORAL_CAPSULE | Freq: Three times a day (TID) | ORAL | 11 refills | Status: DC

## 2022-10-20 DIAGNOSIS — J018 Other acute sinusitis: Secondary | ICD-10-CM | POA: Diagnosis not present

## 2022-10-30 ENCOUNTER — Ambulatory Visit (INDEPENDENT_AMBULATORY_CARE_PROVIDER_SITE_OTHER): Payer: BC Managed Care – PPO | Admitting: Behavioral Health

## 2022-10-30 DIAGNOSIS — F4310 Post-traumatic stress disorder, unspecified: Secondary | ICD-10-CM | POA: Diagnosis not present

## 2022-10-30 DIAGNOSIS — G4733 Obstructive sleep apnea (adult) (pediatric): Secondary | ICD-10-CM

## 2022-10-30 DIAGNOSIS — F3181 Bipolar II disorder: Secondary | ICD-10-CM | POA: Diagnosis not present
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
## 2022-11-01 ENCOUNTER — Encounter: Payer: Self-pay | Admitting: Behavioral Health

## 2022-11-01 DIAGNOSIS — D225 Melanocytic nevi of trunk: Secondary | ICD-10-CM | POA: Diagnosis not present

## 2022-11-01 DIAGNOSIS — L821 Other seborrheic keratosis: Secondary | ICD-10-CM | POA: Diagnosis not present

## 2022-11-01 DIAGNOSIS — Z08 Encounter for follow-up examination after completed treatment for malignant neoplasm: Secondary | ICD-10-CM | POA: Diagnosis not present

## 2022-11-01 DIAGNOSIS — L814 Other melanin hyperpigmentation: Secondary | ICD-10-CM | POA: Diagnosis not present

## 2022-11-01 DIAGNOSIS — D485 Neoplasm of uncertain behavior of skin: Secondary | ICD-10-CM | POA: Diagnosis not present

## 2022-11-02 ENCOUNTER — Ambulatory Visit (INDEPENDENT_AMBULATORY_CARE_PROVIDER_SITE_OTHER): Payer: BC Managed Care – PPO | Admitting: Behavioral Health

## 2022-11-02 ENCOUNTER — Encounter: Payer: Self-pay | Admitting: Behavioral Health

## 2022-11-02 DIAGNOSIS — G4733 Obstructive sleep apnea (adult) (pediatric): Secondary | ICD-10-CM | POA: Diagnosis not present

## 2022-11-02 DIAGNOSIS — F3181 Bipolar II disorder: Secondary | ICD-10-CM | POA: Diagnosis not present

## 2022-11-02 NOTE — Progress Notes (Signed)
[facility] Counselor/Therapist Progress Note  Patient ID: Edwin [name], MRN: [id number],    Date: 11/02/2022  Time Spent: 60 minutes  Treatment Type: Individual Therapy  Reported Symptoms: Anger, sadness  Mental Status Exam:  Appearance:   Casual     Behavior:  Appropriate  Motor:  Normal  Speech/Language:   Clear and Coherent  Affect:  Congruent  Mood:  euthymic  Thought process:  normal  Thought content:    WNL  Sensory/Perceptual disturbances:    WNL  Orientation:  oriented to person  Attention:  Good  Concentration:  Good  Memory:  WNL  Fund of knowledge:   Good  Insight:    Good  Judgment:   Good  Impulse Control:  Good   Risk Assessment: Danger to Self:  No Self-injurious Behavior: No Danger to Others: No Duty to Warn:no Physical Aggression / Violence:No  Access to Firearms a concern: No  Gang Involvement:No   Subjective: The patient reports he continues to take medication as prescribed and states the bubble packs help. He reports grieving the loss of a previous relationship with a former male that lasted approximately two years. The patient identified himself in the bargaining stage due to "I was never able to accept the loss of the relationship its the manner of the way it ended". He described his emotions related to the situation as "Its hard to believe that it happened, its hard to believe that its permanent. Its difficult for me to accept that we went from being close caring people to no dialogue completely cut off". He reports to feel sad, resentful and betrayed and expressed feelings of anger. The patient reports "Time does not necessarily heal all things the relationship was from 1979 to 1982".   He reports this previous relationship has not affected other relationships such as friendships from flourishing. He states he is optimistic in his friendship relationships. He reports due to being married it has been a challenge to hide these emotions from  his wife. He described his previous relationship as "Being psychological and spiritually matched there was a lot of respect for tradition". He states having a long distance relationship is preventing him from creating shared experiences with his wife. He states to share the same values, but not sharing the same conversational interest. He reports wanting the same feelings of closeness with his wife as his former girlfriend. He states he has attempted to have other dating relationships after his former girlfriend that he could not make last due to missing former girlfriend. He identified values, trustworthiness, maturity and kindness in his wife as valuable qualities. However, he states to feel his wife is limited in being able to discuss certain conversational issues with him. He reports he does not want to risk feeling lonely or lost with his wife. The patient reports attempting to find his way in college. He states with receiving his diagnosis and the appropriate treatment has been helpful with allowing him to finish his education and move forward with his career. He reports experiencing gratitude with not being in a relationship now where he feels dependent or "Couldn't stand on my two feet". He states belief that his former relationship may have been a co-dependent relationship due to he was not receiving the proper psychiatric treatment. The patient reports his take away from today's session as coming into the realization during this session that he is lacking "Understanding the difference between addiction and love. I am hoping that healthy love  is going to be as fulfilling".  The counselor educated the patient on the stages of grief and loss. The counselor explored with the client what stage he was in with the grieving process. The counselor discussed pros and cons of the patient not allowing himself to move forward in his life. The counselor discussed codependency. Counselor assessed for SI/HI    Interventions: Grief Therapy  Diagnosis:   ICD-10-CM   1. Bipolar II disorder (HCC)  F31.81     2. Obstructive sleep apnea  G47.33       Plan:   The patient reports to have intrusive memories from a past relationship that makes him sad, and affects his sleep. He states belief that he is grieving the loss of the former relationship.    Long Term Goal: To begin a healthy grieving process around the loss of a former relationship. Short Term Goal: Explore and resolve grief and loss issues. Objective: Discuss issues of grief weekly with the therapist.  Objective: Continue to explore and resolve issues of grief/loss as they arise.    Clydia Llano, [facility]

## 2022-11-05 ENCOUNTER — Other Ambulatory Visit: Payer: Self-pay

## 2022-11-05 ENCOUNTER — Other Ambulatory Visit: Payer: Self-pay | Admitting: Adult Health

## 2022-11-05 ENCOUNTER — Emergency Department (HOSPITAL_COMMUNITY)
Admission: EM | Admit: 2022-11-05 | Discharge: 2022-11-05 | Disposition: A | Discharge status: Home / Self Care | Payer: BC Managed Care – PPO | Attending: Emergency Medicine | Admitting: Emergency Medicine

## 2022-11-05 ENCOUNTER — Emergency Department (HOSPITAL_COMMUNITY): Payer: BC Managed Care – PPO

## 2022-11-05 ENCOUNTER — Encounter (HOSPITAL_COMMUNITY): Payer: Self-pay

## 2022-11-05 DIAGNOSIS — I129 Hypertensive chronic kidney disease with stage 1 through stage 4 chronic kidney disease, or unspecified chronic kidney disease: Secondary | ICD-10-CM | POA: Diagnosis not present

## 2022-11-05 DIAGNOSIS — N189 Chronic kidney disease, unspecified: Secondary | ICD-10-CM | POA: Diagnosis not present

## 2022-11-05 DIAGNOSIS — Z79899 Other long term (current) drug therapy: Secondary | ICD-10-CM | POA: Diagnosis not present

## 2022-11-05 DIAGNOSIS — E1122 Type 2 diabetes mellitus with diabetic chronic kidney disease: Secondary | ICD-10-CM | POA: Diagnosis not present

## 2022-11-05 DIAGNOSIS — S0990XA Unspecified injury of head, initial encounter: Secondary | ICD-10-CM | POA: Diagnosis not present

## 2022-11-05 DIAGNOSIS — Z7902 Long term (current) use of antithrombotics/antiplatelets: Secondary | ICD-10-CM | POA: Insufficient documentation

## 2022-11-05 DIAGNOSIS — Z7984 Long term (current) use of oral hypoglycemic drugs: Secondary | ICD-10-CM | POA: Diagnosis not present

## 2022-11-05 DIAGNOSIS — W01190A Fall on same level from slipping, tripping and stumbling with subsequent striking against furniture, initial encounter: Secondary | ICD-10-CM | POA: Diagnosis not present

## 2022-11-05 DIAGNOSIS — T1490XA Injury, unspecified, initial encounter: Secondary | ICD-10-CM | POA: Diagnosis not present

## 2022-11-05 DIAGNOSIS — W19XXXA Unspecified fall, initial encounter: Secondary | ICD-10-CM

## 2022-11-05 NOTE — ED Provider Triage Note (Signed)
Emergency Medicine Provider Triage Evaluation Note  Edwin [name] , a 66 y.o. male  was evaluated in triage.  Pt states he fell backwards from a chair striking the back of his head on a table.  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  He reports no pain, but states his head feels "funny" and his vision does not feel right.  He did not lose consciousness.  Denies any other injury from the fall.  Review of Systems  Positive: As above Negative: As above  Physical Exam  BP 124/86   Pulse (!) 59   Temp 97.7 F (36.5 C) (Oral)   Resp 17   SpO2 100%  Gen:   Awake, no distress   Resp:  Normal effort  MSK:   Moves extremities without difficulty  Other:    Medical Decision Making  Medically screening exam initiated at 6:59 PM.  Appropriate orders placed.  Edwin [name] was informed that the remainder of the evaluation will be completed by another provider, this initial triage assessment does not replace that evaluation, and the importance of remaining in the ED until their evaluation is complete.     Melton Alar R, [place] 11/05/22 1900

## 2022-11-05 NOTE — Discharge Instructions (Addendum)
Take Tylenol as needed for headaches.  Return to the emergency department for any new or worsening symptoms of concern.

## 2022-11-05 NOTE — ED Triage Notes (Signed)
Pt. Arrives POV for a fall on blood thinners. Pt. Hit his head on the corner of a table. Pt. Takes plavix. He is CAOx4 at this time.

## 2022-11-05 NOTE — ED Provider Notes (Signed)
[facility]-EMERGENCY DEPT Provider Note   CSN: [id number] Arrival date & time: 11/05/22  1849     History  Chief Complaint  Patient presents with   Edwin [name] is a 66 y.o. male.   HPI Patient presents after a fall.  Medical history includes HTN, bipolar disorder, CKD, gout, CVA, T2DM, HLD, depression.  Approximately 30 minutes prior to arrival, patient was seated on a chair with rollers on it.  This chair tipped backwards causing him to fall.  As he fell backwards, he struck the back of his head on the edge of a table.  He did not lose consciousness.  He felt a heavy feeling in his frontal head area and behind his eyes.  He denies any current headache or neck pain.  He denies any other areas of discomfort or suspected injury.  Patient is on Plavix.    Home Medications Prior to Admission medications   Medication Sig Start Date End Date Taking? Authorizing Provider  amLODipine-atorvastatin (CADUET) 5-10 MG tablet Take 1 tablet by mouth daily.    [provider]  Cholecalciferol 1000 units TBDP Take 1 tablet by mouth daily.     [provider]  clopidogrel (PLAVIX) 75 MG tablet Take 1 tablet (75 mg total) by mouth daily. 08/23/22   Butch Penny, NP  dapagliflozin propanediol (FARXIGA) 10 MG TABS tablet Take 10 mg by mouth daily.    [provider]  diclofenac Sodium (VOLTAREN) 1 % GEL Apply 2 g topically daily as needed (for knee pain).    [provider]  divalproex (DEPAKOTE ER) 500 MG 24 hr tablet Take 2 tablets (1,000 mg total) by mouth at bedtime. 05/01/22   Cottle, Steva Ready., MD  DULoxetine (CYMBALTA) 60 MG capsule Take 2 capsules (120 mg total) by mouth daily. 05/01/22   Cottle, Steva Ready., MD  febuxostat (ULORIC) 40 MG tablet Take 40 mg by mouth daily.    [provider]  furosemide (LASIX) 20 MG tablet Take 20 mg by mouth daily as needed for edema. May take 2 to 3 days weekly 07/21/19   [provider]  gabapentin (NEURONTIN) 400 MG capsule Take 1 capsule (400 mg total) by mouth 3 (three) times daily. 10/18/22   Butch Penny, NP  glucosamine-chondroitin 500-400 MG tablet Take 1 tablet by mouth 1 day or 1 dose.    [provider]  hydrALAZINE (APRESOLINE) 50 MG tablet Take 1 tablet (50 mg total) by mouth 3 (three) times daily. 02/06/22   Ghimire, Werner Lean, MD  lamoTRIgine (LAMICTAL) 200 MG tablet Take 1 tablet (200 mg total) by mouth every morning. 05/01/22   Cottle, Steva Ready., MD  LevOCARNitine (L-CARNITINE) 500 MG TABS Take 500 mg by mouth 2 (two) times daily.    [provider]  losartan (COZAAR) 100 MG tablet Take 100 mg by mouth daily. 06/29/22   [provider]  losartan (COZAAR) 50 MG tablet Take 2 tablets (100 mg total) by mouth daily. 02/06/22   Ghimire, Werner Lean, MD  metFORMIN (GLUCOPHAGE-XR) 500 MG 24 hr tablet Take 500 mg by mouth 2 (two) times daily. 01/07/22   [provider]  multivitamin-lutein (OCUVITE-LUTEIN) CAPS capsule Take 1 capsule by mouth daily.    [provider]  pramipexole (MIRAPEX) 0.5 MG tablet Take 1 tablet (0.5 mg total) by mouth 2 (two) times daily. 05/01/22   Cottle, Steva Ready., MD  tamsulosin (FLOMAX) 0.4 MG CAPS  capsule Take 0.4 mg by mouth daily. 06/04/22   [provider]      Allergies    Allopurinol, Penicillins, Finerenone, Penicillin g, Aspirin, and Ciprofloxacin    Review of Systems   Review of Systems  Neurological:  Negative for syncope, weakness, numbness and headaches.  All other systems reviewed and are negative.   Physical Exam Updated Vital Signs BP 134/66   Pulse (!) 54   Temp 97.7 F (36.5 C) (Oral)   Resp 16   SpO2 100%  Physical Exam Vitals and nursing note reviewed.  Constitutional:      General: He is not in acute distress.    Appearance: Normal appearance. He is well-developed. He is not ill-appearing, toxic-appearing or diaphoretic.  HENT:     Head:  Normocephalic and atraumatic.     Right Ear: External ear normal.     Left Ear: External ear normal.     Nose: Nose normal.     Mouth/Throat:     Mouth: Mucous membranes are moist.  Eyes:     Extraocular Movements: Extraocular movements intact.     Conjunctiva/sclera: Conjunctivae normal.  Cardiovascular:     Rate and Rhythm: Normal rate and regular rhythm.     Heart sounds: No murmur heard. Pulmonary:     Effort: Pulmonary effort is normal. No respiratory distress.  Chest:     Chest wall: No tenderness.  Abdominal:     General: There is no distension.     Palpations: Abdomen is soft.     Tenderness: There is no abdominal tenderness.  Musculoskeletal:        General: No swelling or deformity. Normal range of motion.     Cervical back: Normal range of motion and neck supple.     Right lower leg: No edema.     Left lower leg: No edema.  Skin:    General: Skin is warm and dry.     Coloration: Skin is not jaundiced or pale.  Neurological:     General: No focal deficit present.     Mental Status: He is alert and oriented to person, place, and time.     Cranial Nerves: No cranial nerve deficit.     Sensory: No sensory deficit.     Motor: No weakness.     Coordination: Coordination normal.  Psychiatric:        Mood and Affect: Mood normal.        Behavior: Behavior normal.        Thought Content: Thought content normal.        Judgment: Judgment normal.     ED Results / Procedures / Treatments   Labs (all labs ordered are listed, but only abnormal results are displayed) Labs Reviewed - No data to display  EKG None  Radiology CT Head Wo Contrast  Result Date: 11/05/2022 CLINICAL DATA:  Head trauma, coagulopathy (Age 55-64y) Head trauma, anticoagulated with Plavix; Head trauma, anticoagulated with Plavix EXAM: CT HEAD WITHOUT CONTRAST CT CERVICAL SPINE WITHOUT CONTRAST TECHNIQUE: Multidetector CT imaging of the head and cervical spine was performed following the standard  protocol without intravenous contrast. Multiplanar CT image reconstructions of the cervical spine were also generated. RADIATION DOSE REDUCTION: This exam was performed according to the departmental dose-optimization program which includes automated exposure control, adjustment of the mA and/or kV according to patient size and/or use of iterative reconstruction technique. COMPARISON:  CT head 06/30/2022 FINDINGS: CT HEAD FINDINGS Brain: Patchy and confluent areas of decreased attenuation  are noted throughout the deep and periventricular white matter of the cerebral hemispheres bilaterally, compatible with chronic microvascular ischemic disease. No evidence of large-territorial acute infarction. No parenchymal hemorrhage. No mass lesion. No extra-axial collection. No mass effect or midline shift. No hydrocephalus. Basilar cisterns are patent. Vascular: No hyperdense vessel. Skull: No acute fracture or focal lesion. Sinuses/Orbits: Paranasal sinuses and mastoid air cells are clear. Bilateral lens replacement. The orbits are unremarkable. Other: None. CT CERVICAL SPINE FINDINGS Alignment: Normal. Skull base and vertebrae: Multilevel mild degenerative changes spine. No acute fracture. No aggressive appearing focal osseous lesion or focal pathologic process. Soft tissues and spinal canal: No prevertebral fluid or swelling. No visible canal hematoma. Upper chest: Unremarkable. Other: None. IMPRESSION: 1. No acute intracranial abnormality. 2. No acute displaced fracture or traumatic listhesis of the cervical spine. Electronically Signed   By: Tish Frederickson M.D.   On: 11/05/2022 20:40   CT Cervical Spine Wo Contrast  Result Date: 11/05/2022 CLINICAL DATA:  Head trauma, coagulopathy (Age 53-64y) Head trauma, anticoagulated with Plavix; Head trauma, anticoagulated with Plavix EXAM: CT HEAD WITHOUT CONTRAST CT CERVICAL SPINE WITHOUT CONTRAST TECHNIQUE: Multidetector CT imaging of the head and cervical spine was  performed following the standard protocol without intravenous contrast. Multiplanar CT image reconstructions of the cervical spine were also generated. RADIATION DOSE REDUCTION: This exam was performed according to the departmental dose-optimization program which includes automated exposure control, adjustment of the mA and/or kV according to patient size and/or use of iterative reconstruction technique. COMPARISON:  CT head 06/30/2022 FINDINGS: CT HEAD FINDINGS Brain: Patchy and confluent areas of decreased attenuation are noted throughout the deep and periventricular white matter of the cerebral hemispheres bilaterally, compatible with chronic microvascular ischemic disease. No evidence of large-territorial acute infarction. No parenchymal hemorrhage. No mass lesion. No extra-axial collection. No mass effect or midline shift. No hydrocephalus. Basilar cisterns are patent. Vascular: No hyperdense vessel. Skull: No acute fracture or focal lesion. Sinuses/Orbits: Paranasal sinuses and mastoid air cells are clear. Bilateral lens replacement. The orbits are unremarkable. Other: None. CT CERVICAL SPINE FINDINGS Alignment: Normal. Skull base and vertebrae: Multilevel mild degenerative changes spine. No acute fracture. No aggressive appearing focal osseous lesion or focal pathologic process. Soft tissues and spinal canal: No prevertebral fluid or swelling. No visible canal hematoma. Upper chest: Unremarkable. Other: None. IMPRESSION: 1. No acute intracranial abnormality. 2. No acute displaced fracture or traumatic listhesis of the cervical spine. Electronically Signed   By: Tish Frederickson M.D.   On: 11/05/2022 20:40    Procedures Procedures    Medications Ordered in ED Medications - No data to display  ED Course/ Medical Decision Making/ A&P                           Medical Decision Making  This patient presents to the ED for concern of fall, this involves an extensive number of treatment options, and is a  complaint that carries with it a high risk of complications and morbidity.  The differential diagnosis includes acute injuries   Co morbidities that complicate the patient evaluation  HTN, bipolar disorder, CKD, gout, CVA, T2DM, HLD, depression   Additional history obtained:  Additional history obtained from N/A External records from outside source obtained and reviewed including EMR  Imaging Studies ordered:  I ordered imaging studies including CT of head and cervical spine I independently visualized and interpreted imaging which showed no acute findings I agree with the radiologist interpretation  Cardiac Monitoring: / EKG:  The patient was maintained on a cardiac monitor.  I personally viewed and interpreted the cardiac monitored which showed an underlying rhythm of: Sinus rhythm  Problem List / ED Course / Critical interventions / Medication management  Patient is a 66 year old male presenting after a fall.  Fall occurred from leaning back on a chair.  He did strike the occipital region of his head on a piece of furniture during this fall.  He did not lose consciousness.  On arrival in the ED, he is alert and oriented.  He has no scalp swelling or abrasions.  He has no areas of tenderness.  He has no focal neurologic deficits.  He denies any headache at the moment.  Patient underwent CT imaging of head and cervical spine which did not show any acute findings.  He remained asymptomatic while in the ED.  He was informed of reassuring imaging results and discharged in good condition.   Social Determinants of Health:  Has access to outpatient care        Final Clinical Impression(s) / ED Diagnoses Final diagnoses:  Fall, initial encounter    Rx / DC Orders ED Discharge Orders     None         Gloris Manchester, MD 11/05/22 2137

## 2022-11-05 NOTE — ED Notes (Signed)
Pt. Left ED without receiving discharge paperwork.

## 2022-11-06 DIAGNOSIS — N183 Chronic kidney disease, stage 3 unspecified: Secondary | ICD-10-CM | POA: Diagnosis not present

## 2022-11-06 DIAGNOSIS — G4733 Obstructive sleep apnea (adult) (pediatric): Secondary | ICD-10-CM | POA: Diagnosis not present

## 2022-11-06 DIAGNOSIS — E1169 Type 2 diabetes mellitus with other specified complication: Secondary | ICD-10-CM | POA: Diagnosis not present

## 2022-11-06 DIAGNOSIS — G4734 Idiopathic sleep related nonobstructive alveolar hypoventilation: Secondary | ICD-10-CM | POA: Diagnosis not present

## 2022-11-06 DIAGNOSIS — I10 Essential (primary) hypertension: Secondary | ICD-10-CM | POA: Diagnosis not present

## 2022-11-07 ENCOUNTER — Ambulatory Visit (INDEPENDENT_AMBULATORY_CARE_PROVIDER_SITE_OTHER): Payer: BC Managed Care – PPO | Admitting: Behavioral Health

## 2022-11-07 DIAGNOSIS — F3181 Bipolar II disorder: Secondary | ICD-10-CM | POA: Diagnosis not present

## 2022-11-07 NOTE — Progress Notes (Unsigned)
[facility] Counselor/Therapist Progress Note  Patient ID: Edwin [name], MRN: [id number],    Date: 11/07/2022  Time Spent: 60 minutes  Treatment Type: Individual Therapy  Reported Symptoms: The patient denied experiencing any mental health symptoms.   Mental Status Exam:  Appearance:   Casual     Behavior:  Appropriate  Motor:  Normal  Speech/Language:   Clear and Coherent  Affect:  Appropriate  Mood:  normal  Thought process:  normal  Thought content:    WNL  Sensory/Perceptual disturbances:    WNL  Orientation:  oriented to person  Attention:  Good  Concentration:  Good  Memory:  WNL  Fund of knowledge:   Good  Insight:    Good  Judgment:   Good  Impulse Control:  Good   Risk Assessment: Danger to Self:  No Self-injurious Behavior: No Danger to Others: No Duty to Warn:no Physical Aggression / Violence:No  Access to Firearms a concern: No  Gang Involvement:No   Subjective: *** The patient reports thinking since the last time in session "I was able to think about what I missed really wasn't" States recently having a conversation about his memories with a friend The patient reflected and processed memories from the past in therapy session The patient discussed losing a cat and finding him again and relating the story to the lost sheep a parable He also discussed reuniting with a long lost girlfriend whom he states is a best friend to him today He reports in discussing his memories with his former girlfriend "I was cherry picking my memories and this was not the right person for me. Her behaviors were selfish, inconsistent, immature. I was emotionally disabled because of my mental health and she was emotionally immature". He reports having realization that he has became aware that he was not wrong  how his relationship ended with his former girlfriend he states belief that "In the end God was working all the time" States belief that his spirituality plays a role  in many areas of his life  Expressed gratitude with feeling that he is now more emotionally available to his wife Patient reports feeling like gaining insight on his former relationship with his girlfriend has allowed him to practice acceptance of how it ended States know wanting to gain insight and understanding on how he perceived her in the beginning  Reports belief that he was experiencing depression when he met his former girlfriend and states belief that she stabilized him  States depression began in his highschool years and reports belief that he was relief when he met his former girlfriend  States wanting to learn how to identify unhealthiness in his former relationship as allowed him to "Be more in the present" states he is able to have memories now and not allow himself to become attached to the memories  Patient identified being able to heal from old emotional wounds in the past Patient identified his take away from today's session as "I feel like the more I am able to talk things out and get some feedback that is helpful the more I feel like I am going to get to the point where I will be able to put all of this to bed"  Interventions: Grief Therapy  Diagnosis:   ICD-10-CM   1. Bipolar II disorder [facility])  F31.81       Plan: ***  Jannifer Hick, [facility]

## 2022-11-08 ENCOUNTER — Encounter: Payer: Self-pay | Admitting: Adult Health

## 2022-11-08 ENCOUNTER — Encounter: Payer: Self-pay | Admitting: Behavioral Health

## 2022-11-08 DIAGNOSIS — N3289 Other specified disorders of bladder: Secondary | ICD-10-CM | POA: Diagnosis not present

## 2022-11-08 DIAGNOSIS — R972 Elevated prostate specific antigen [PSA]: Secondary | ICD-10-CM | POA: Diagnosis not present

## 2022-11-08 DIAGNOSIS — N401 Enlarged prostate with lower urinary tract symptoms: Secondary | ICD-10-CM | POA: Diagnosis not present

## 2022-11-08 DIAGNOSIS — N138 Other obstructive and reflux uropathy: Secondary | ICD-10-CM | POA: Diagnosis not present

## 2022-11-09 ENCOUNTER — Encounter: Payer: Self-pay | Admitting: Behavioral Health

## 2022-11-09 ENCOUNTER — Ambulatory Visit (INDEPENDENT_AMBULATORY_CARE_PROVIDER_SITE_OTHER): Payer: BC Managed Care – PPO | Admitting: Behavioral Health

## 2022-11-09 DIAGNOSIS — F3181 Bipolar II disorder: Secondary | ICD-10-CM | POA: Diagnosis not present

## 2022-11-09 NOTE — Progress Notes (Signed)
[facility] Counselor/Therapist Progress Note  Patient ID: Edwin [name], MRN: [id number],    Date: 11/09/2022  Time Spent: 60 minutes   Treatment Type: Individual Therapy  Reported Symptoms: No symptoms reported   Mental Status Exam:  Appearance:   Casual     Behavior:  Appropriate  Motor:  Normal  Speech/Language:   Clear and Coherent  Affect:  Appropriate  Mood:  euthymic  Thought process:  normal  Thought content:    WNL  Sensory/Perceptual disturbances:    WNL  Orientation:  oriented to person  Attention:  Good  Concentration:  Good  Memory:  WNL  Fund of knowledge:   Good  Insight:    Good  Judgment:   Good  Impulse Control:  Good   Risk Assessment: Danger to Self:  No Self-injurious Behavior: No Danger to Others: No Duty to Warn:no Physical Aggression / Violence:No  Access to Firearms a concern: No  Gang Involvement:No   Subjective: The patient reports doing well today.He states interest with wanting to process how he entered his previous relationship with his former girlfriend. He states being able to identify when he began to experience depression in the past. He reports when he met his former girlfriend he experienced a period of feeling better and states grieving the relationship when it ended. He reports having a helpful support system in the past. He states not realizing in the past "I suffered from a biological component that made me feel my emotions more intense". The patient processed additional former relationships he had with women from his past after his relationship with his significant other ended. He reports experiencing a brief period of struggling with his spirituality he states "I was torn I felt a basic futility for life but I didn't feel comfortable with atheism". He states later on he regained spiritual beliefs regarding his faith again and states "I really felt like God was reaching out to me". He also states he had friends to assist him  with strengthening his relationship with God.   The patient reports belief that he was in a co-dependent relationship with the young lady he began dating years ago and states coming to the realization that she was not the person for him due to her character flaws of selfishness, domination, immaturity of wanting to have her way, child like behavior, emotional immaturity and manipulative. He states he has identified she had "A real need for recognition I realize your true friends care about you. I couldn't see that then in my condition not knowing that I painted a landscape that was so different than what was going on". He reports feeling relief from memories that kept him in the darkness he identified the memories as a false sense of security. He states receiving knowledge later about his mental illness allowed him to gain understanding of his needs. He reports being affected in the past by his former girlfriend not acknowledging his mental illness has affected his ability to move forward in life. He states he has gained insight that forgiveness is about "Me letting go and not wishing harm on the other person. The Bible says vengeance is mine says the [employer] The patient became emotional during this therapy session. The patient identified his take away from today's sessions as his awareness has been broaden and it helped him to see things more holistically. He states in the past he had not processed his emotions and understood his feelings and his need of having  a balanced life. He states "I realize those were not as good times as I thought they were and I can have times that are good now that are not going to be compared to things that are not the truth. I feel like the success of therapy is being able to see what is true about things". Denied SI/HI, AH/VH  The counselor provided assistance with helping the patient process grief and loss involving a former ex-girlfriend. The counselor validated the patients  feelings and offered encouragement and feedback. The counselor assessed for SI/HI, AH/VH.     Interventions: Grief Therapy  Diagnosis:   ICD-10-CM   1. Bipolar II disorder ([place])  F31.81       Plan:   The patient reports to have intrusive memories from a past relationship that makes him sad, and affects his sleep. He states belief that he is grieving the loss of the former relationship.    Long Term Goal: To begin a healthy grieving process around the loss of a former relationship. Short Term Goal: Explore and resolve grief and loss issues. Objective: Discuss issues of grief weekly with the therapist.  Objective: Continue to explore and resolve issues of grief/loss as they arise.   Jannifer Hick, [facility]

## 2022-11-15 ENCOUNTER — Ambulatory Visit (INDEPENDENT_AMBULATORY_CARE_PROVIDER_SITE_OTHER): Payer: BC Managed Care – PPO | Admitting: Behavioral Health

## 2022-11-15 ENCOUNTER — Encounter: Payer: Self-pay | Admitting: Behavioral Health

## 2022-11-15 DIAGNOSIS — F3181 Bipolar II disorder: Secondary | ICD-10-CM | POA: Diagnosis not present

## 2022-11-15 NOTE — Progress Notes (Signed)
      [facility] Counselor/Therapist Progress Note  Patient ID: Edwin [name], MRN: [id number],    Date: 11/15/2022  Time Spent: 50 minutes  Treatment Type: Individual Therapy  Reported Symptoms: Grief   Mental Status Exam:  Appearance:   Casual     Behavior:  Appropriate  Motor:  Normal  Speech/Language:   Clear and Coherent  Affect:  Appropriate  Mood:  normal  Thought process:  normal  Thought content:    WNL  Sensory/Perceptual disturbances:    WNL  Orientation:  oriented to person  Attention:  Good  Concentration:  Good  Memory:  WNL  Fund of knowledge:   Good  Insight:    Good  Judgment:   Good  Impulse Control:  Good   Risk Assessment: Danger to Self:  No Self-injurious Behavior: No Danger to Others: No Duty to Warn:no Physical Aggression / Violence:No  Access to Firearms a concern: No   Gang Involvement:No   Subjective: The patient presented with cooperative behavior during this session. He reports he has recently been prescribed a new medication to address his kidney disease. 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 During this session the patient reflected on memories from his past regarding his pet cats. He processed his emotions regarding observing one of his pet cats are currently dying. He reported understanding death is a part of life. He states to view grief and loss in a different light due to experiencing depression. He reports "I see grief and loss as honoring someone and depression is just a feeling of being disdained or hopeless".  He reports he is not afraid of having to put his cat down and not afraid of experiencing his emotions with crying or grieving his dying cat. He reports to have understanding there is a process of healing and states it has been challenging for him to resolve his emotions with grieving his former relationship due to not understanding how someone can lack empathy  and no longer care for another individual as the person cares for them. He presents in the grief stage of acceptance today with processing the thought of losing his dying pet. He denied SI/HI and AH/VH.  The counselor acknowledged and validated the patients statements. The counselor encouraged the patient to process his emotions regarding his dying pet in session. The counselor discussed coping with grief and loss in a healthy manner. The counselor actively listened to the patient and provided supportive encouragement and feedback. The counselor assessed for SI/HI and AH/VH.  Interventions: Grief Therapy  Diagnosis:   ICD-10-CM   1. Bipolar II disorder (HCC)  F31.81       Plan:    The patient reports to have intrusive memories from a past relationship that makes him sad, and affects his sleep. He states belief that he is grieving the loss of the former relationship.    Long Term Goal: To begin a healthy grieving process around the loss of a former relationship. Short Term Goal: Explore and resolve grief and loss issues. Objective: Discuss issues of grief weekly with the therapist.  Objective: Continue to explore and resolve issues of grief/loss as they arise.   Clydia Llano, [facility]

## 2022-11-16 DIAGNOSIS — G4734 Idiopathic sleep related nonobstructive alveolar hypoventilation: Secondary | ICD-10-CM | POA: Diagnosis not present

## 2022-11-20 ENCOUNTER — Ambulatory Visit: Payer: BC Managed Care – PPO | Admitting: Behavioral Health

## 2022-11-20 DIAGNOSIS — H26491 Other secondary cataract, right eye: Secondary | ICD-10-CM | POA: Diagnosis not present

## 2022-11-21 DIAGNOSIS — E119 Type 2 diabetes mellitus without complications: Secondary | ICD-10-CM | POA: Diagnosis not present

## 2022-11-27 ENCOUNTER — Ambulatory Visit (INDEPENDENT_AMBULATORY_CARE_PROVIDER_SITE_OTHER): Payer: BC Managed Care – PPO | Admitting: Behavioral Health

## 2022-11-27 DIAGNOSIS — F3181 Bipolar II disorder: Secondary | ICD-10-CM | POA: Diagnosis not present

## 2022-11-27 NOTE — Progress Notes (Signed)
[facility] Counselor/Therapist Progress Note  Patient ID: Edwin [name], MRN: [id number],    Date: 12/03/2022  Time Spent: 60 minutes   Treatment Type: Individual Therapy  Reported Symptoms: Sadness due to grief and loss.   Mental Status Exam:  Appearance:   Casual     Behavior:  Appropriate and Sharing  Motor:  Normal  Speech/Language:   Clear and Coherent  Affect:  Appropriate and Congruent  Mood:  sad  Thought process:  normal  Thought content:    WNL  Sensory/Perceptual disturbances:    WNL  Orientation:  oriented to person  Attention:  Good  Concentration:  Good  Memory:  WNL  Fund of knowledge:   Good  Insight:    Good  Judgment:   Good  Impulse Control:  Good   Risk Assessment: Danger to Self:  No Self-injurious Behavior: No Danger to Others: No Duty to Warn:no Physical Aggression / Violence:No  Access to Firearms a concern: No  Gang Involvement:No   Subjective: The patient presented with cooperative behavior in session. The patient reports his youngest cat died from cancer. He processed concerns regarding a teen family friend. The patient reported concerns regarding the teens mental health. The patient received referral information to assist him regarding his concerns. The patient processed the death of his pet in session. He compared the experience of the death of his pet as a spiritual experience. He shared his belief of God's love for man and his unconditional love for his pet. He states belief that his pet was a part of his life sent by God to help him. The patient reports "I was able to let go of any past resentment all I want to do now is love". The patient reports feeling like "I am here today I don't feel like a part of me is in the past". The patient reports being in the acceptance stage of grief and loss regarding his deceased pet and the former relationship that initially brought him to therapy. He identified his take way as "I am more certain now  than ever that God is with me". He reports losing his pet has allowed him to put things in perspective regarding the loss of his former relationship. The patient reports interest with decreasing his therapy session and attending therapy biweekly. He denied SI/HI, AH/VH.   The counselor discussed mental health symptoms experienced. The counselor provided the patient with resources to include [place], Primary care assistance and [place] for his family teen friend. The counselor processed and discussed grief and loss and requested feedback from the patient regarding the stage he is currently in. The counselor incorporated spirituality in session due to the patients spiritual beliefs. The counselor assessed for SI/HI, AH/VH.   Interventions: Grief Therapy  Diagnosis:   ICD-10-CM   1. Bipolar II disorder ([place])  F31.81       Plan:  The patient reports to have intrusive memories from a past relationship that makes him sad, and affects his sleep. He states belief that he is grieving the loss of the former relationship.    Long Term Goal: To begin a healthy grieving process around the loss of a former relationship. Short Term Goal: Explore and resolve grief and loss issues. Objective: Discuss issues of grief weekly with the therapist.  Objective: Continue to explore and resolve issues of grief/loss as they arise.    Jannifer Hick, [facility]

## 2022-11-29 ENCOUNTER — Ambulatory Visit (INDEPENDENT_AMBULATORY_CARE_PROVIDER_SITE_OTHER): Payer: BC Managed Care – PPO | Admitting: Pulmonary Disease

## 2022-11-29 ENCOUNTER — Encounter (HOSPITAL_BASED_OUTPATIENT_CLINIC_OR_DEPARTMENT_OTHER): Payer: Self-pay | Admitting: Pulmonary Disease

## 2022-11-29 ENCOUNTER — Telehealth (INDEPENDENT_AMBULATORY_CARE_PROVIDER_SITE_OTHER): Payer: BC Managed Care – PPO | Admitting: Adult Health

## 2022-11-29 VITALS — BP 116/72 | HR 76 | Temp 97.8°F | Ht 70.0 in | Wt 222.4 lb

## 2022-11-29 DIAGNOSIS — I639 Cerebral infarction, unspecified: Secondary | ICD-10-CM | POA: Diagnosis not present

## 2022-11-29 DIAGNOSIS — R202 Paresthesia of skin: Secondary | ICD-10-CM | POA: Diagnosis not present

## 2022-11-29 DIAGNOSIS — G4734 Idiopathic sleep related nonobstructive alveolar hypoventilation: Secondary | ICD-10-CM

## 2022-11-29 DIAGNOSIS — G4733 Obstructive sleep apnea (adult) (pediatric): Secondary | ICD-10-CM

## 2022-11-29 MED ORDER — GABAPENTIN 100 MG PO CAPS: 100.0000 mg | ORAL_CAPSULE | Freq: Every day | ORAL | 11 refills | Status: DC | PRN

## 2022-11-29 NOTE — Assessment & Plan Note (Signed)
He does not seem to have significant underlying cardiopulmonary disease.  He has minimal bibasal atelectasis and bronchiectasis on previous CT abdomen but repeat chest x-ray in 2022 does not show any worsening.  He is alert time never smoker. If HST shows persistent hypoxia in spite of using oral appliance, then we will investigate further with high-resolution CT chest

## 2022-11-29 NOTE — Patient Instructions (Signed)
Check with your dentist about oral appliance for sleep apnea Options in [place] are Dr Oneal Grout or Dr Marice Potter  Call us to schedule home sleep test once you have this made & are comfortable  Continue O2 meantime during sleep
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
## 2022-11-29 NOTE — Assessment & Plan Note (Signed)
Severity has decreased since 2019 due to his weight loss.  He had severe OSA and more recent home sleep test showed moderate OSA which is improved in the left lateral position.  However I do not think positional therapy will suffice especially given his risk factors for CVA. He truly had poor tolerance of CPAP therapy and does not want to retrial.  I suggested dental appliance.  He has tolerated retainers in the past quite well and is willing to pursue this.  Will refer him to dentist of choice.  Once he has a dental appliance made we can repeat home sleep test to ensure that AHI is improved.  The pathophysiology of obstructive sleep apnea , it's cardiovascular consequences & modes of treatment including CPAP were discused with the patient in detail & they evidenced understanding.

## 2022-11-29 NOTE — Progress Notes (Signed)
PATIENT: Edwin [name] DOB: [birth year]  REASON FOR VISIT: follow up HISTORY FROM: patient  Virtual Visit via Video Note  I connected with Edwin [name] on 11/29/22 at  9:30 AM EST by a video enabled telemedicine application located remotely at [facility] and verified that I am speaking with the correct person using two identifiers who was located at their own home.   I discussed the limitations of evaluation and management by telemedicine and the availability of in person appointments. The patient expressed understanding and agreed to proceed.   PATIENT: Edwin [name] DOB: Sep 16, 1956  REASON FOR VISIT: follow up HISTORY FROM: patient    HISTORY OF PRESENT ILLNESS: Today 11/29/22:  [name] is a 67 year old male with a history of stroke with residual paresthesias in the left upper extremity.  He returns today to discuss his medication.  Reports that he is still having tingling in that hand.  Has gotten some relief with gabapentin.  Has tried doing gabapentin 400 mg 3 times a day but this caused too much drowsiness.  He is now taking 400 twice a day.  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  HISTORY 10/02/22: [name] is a 67 year old male with a history of stroke with residual paresthesias in the left upper extremity.  He returns today for follow-up.  He is currently on gabapentin taking 400 mg twice a day. Tingling down the arm comes and goes. Reports tingling in the back of the left leg as well. Doesn't notice when he is busy but bothersome at other times.  Symptoms were not there prior to his stroke but has remained since history.  He returns today for evaluation     06/20/22: Mr. [name] is a 67 year old male with a history of stroke with residual paresthesia.  He returns today for follow-up. Patient Edwin [name] to the ED on July 15 with left arm and leg numbness.  The patient had residual numbness in the left arm  and leg from his prior stroke but felt that it is gotten worse.  Work-up in the ED was relatively unremarkable.  He was started on Topamax for paresthesias but feels that this made it worse.  He returns today to discuss another medication to try. He describes it has pins and needles sensation in the left arm and leg- not as much numbness. He teaches piano.    Reports that he does feel that is balance is off. Tends to veer to the left when ambulating. Feels like he is going to fall but fortunately has not had any falls.  Reports that when he was completing physical therapy he did find it beneficial.  Would like to have another round of physical therapy.   03/22/22: [name] is a 67 year old male who presented to the ED on March 19 with left-sided sensory deficit and difficulty with ambulation.  He did not receive TNKase due to being out of the window.   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 CT head - 02/04/2022 - No acute intracranial process.  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  Continue Plavix on discharge   Patient reports that he is doing well. Continues to have some tingling on the left side of the body.  Feels it down the arm and leg. Did not complete any therapy. Reports that he had blood work through PCP and reports that LDL is better and is no longer Crestor. States that it caused fatigue and muscle pain.    Reports that he is monitoring diet and sees a nutritionist   OSA: was using it but then went on a trip and the machine adaptor  was broken and was not able to get a replacement CPAP. Was seeing Dr. Maxwell Caul.  States that he struggled using the CPAP.  Reports that he would wake up every hour.  Reports that he tried different mask with no benefit.  Reports that he is interested in inspire but Dr. Maxwell Caul does not do this.  He has not seen Dr. Maxwell Caul in the last year.  He feels that his last sleep study was in 2021 he is requesting to see our sleep physicians here to see if he is a candidate for possible inspire device   REVIEW OF SYSTEMS: Out of a complete 14 system review of symptoms, the patient complains only of the following symptoms, and all other reviewed systems are negative.  ALLERGIES: Allergies  Allergen Reactions   Allopurinol Other (See Comments)    Made pt emotionally unstable  Other reaction(s): emotionally labile   Penicillins Hives and Other (See Comments)    Has patient had a PCN reaction causing immediate rash, facial/tongue/throat swelling, SOB or lightheadedness with hypotension: No Has patient had a PCN reaction causing severe rash involving mucus membranes or skin necrosis: No Has patient had a PCN reaction that required hospitalization No Has patient had a PCN reaction occurring within the last 10 years: No If all of the above answers are "NO", then may proceed with Cephalosporin use.   Finerenone Other (See Comments)    hyperkalemia Other reaction(s): Increases  potassium levels   Penicillin G     Other reaction(s): hives   Aspirin Hives   Ciprofloxacin Rash    Other reaction(s): rash    HOME MEDICATIONS: Outpatient Medications Prior to Visit  Medication Sig Dispense Refill   amLODipine-atorvastatin (CADUET) 5-10 MG tablet Take 1 tablet by mouth daily.     Cholecalciferol 1000 units TBDP Take 1 tablet by mouth daily.      clopidogrel (PLAVIX) 75 MG tablet TAKE ONE TABLET BY MOUTH DAILY 90 tablet 1   dapagliflozin propanediol (FARXIGA) 10 MG TABS tablet Take 10 mg by mouth daily.      diclofenac Sodium (VOLTAREN) 1 % GEL Apply 2 g topically daily as needed (for knee pain).     divalproex (DEPAKOTE ER) 500 MG 24 hr tablet Take 2 tablets (1,000 mg total) by mouth at bedtime. 180 tablet 1   DULoxetine (CYMBALTA) 60 MG capsule Take 2 capsules (120 mg total) by mouth daily. 180 capsule 1   febuxostat (ULORIC) 40 MG tablet Take 40 mg by mouth daily.     furosemide (LASIX) 20 MG tablet Take 20 mg by mouth daily as needed for edema. May take 2 to 3 days weekly     gabapentin (NEURONTIN) 400 MG capsule Take 1 capsule (400 mg total) by mouth 3 (three) times daily. 90 capsule 11   glucosamine-chondroitin  500-400 MG tablet Take 1 tablet by mouth 1 day or 1 dose.     hydrALAZINE (APRESOLINE) 50 MG tablet Take 1 tablet (50 mg total) by mouth 3 (three) times daily.     lamoTRIgine (LAMICTAL) 200 MG tablet Take 1 tablet (200 mg total) by mouth every morning. 90 tablet 1   LevOCARNitine (L-CARNITINE) 500 MG TABS Take 500 mg by mouth 2 (two) times daily.     losartan (COZAAR) 100 MG tablet Take 100 mg by mouth daily.     losartan (COZAAR) 50 MG tablet Take 2 tablets (100 mg total) by mouth daily.     metFORMIN (GLUCOPHAGE-XR) 500 MG 24 hr tablet Take 500 mg by mouth 2 (two) times daily.     multivitamin-lutein (OCUVITE-LUTEIN) CAPS capsule Take 1 capsule by mouth daily.     pramipexole (MIRAPEX) 0.5 MG tablet Take 1 tablet (0.5 mg total) by mouth 2 (two) times daily. 180 tablet 1   tamsulosin (FLOMAX) 0.4 MG CAPS capsule Take 0.4 mg by mouth daily.     No facility-administered medications prior to visit.    PAST MEDICAL HISTORY: Past Medical History:  Diagnosis Date   Allergic rhinitis    Bipolar disorder (HCC)    Bradycardia 2012    due to lithium   CKD (chronic kidney disease) stage 2, GFR 60-89 ml/min    Gout    Hypertension    Mild renal insufficiency    , with creatinine of 1.3 in 2010, was side effect of med   Obesity    ,moderate   Prostatitis    , Episodic   Stroke (HCC)     Suicide attempt (HCC) 09/20/2014    PAST SURGICAL HISTORY: Past Surgical History:  Procedure Laterality Date   APPENDECTOMY     CATARACT EXTRACTION Right    COLONOSCOPY WITH PROPOFOL N/A 06/14/2014   Procedure: COLONOSCOPY WITH PROPOFOL;  Surgeon: Charolett Bumpers, MD;  Location: [facility];  Service: Endoscopy;  Laterality: N/A;   TONSILLECTOMY     UMBILICAL HERNIA REPAIR      FAMILY HISTORY: Family History  Problem Relation Age of Onset   Other Mother        Viral Meningitis   Mitral valve prolapse Mother    Arrhythmia Mother    Hypothyroidism Mother    Stroke Mother    Hypertension Father    Gout Father    Alzheimer's disease Father     SOCIAL HISTORY: Social History   Socioeconomic History   Marital status: Married    Spouse name: Sweet   Number of children: 0   Years of education: Not on file   Highest education level: Master's degree (e.g., MA, MS, MEng, MEd, MSW, MBA)  Occupational History   Not on file  Tobacco Use   Smoking status: Never    Passive exposure: Never   Smokeless tobacco: Never  Substance and Sexual Activity   Alcohol use: No   Drug use: No   Sexual activity: Not on file  Other Topics Concern   Not on file  Social History Narrative   Lives alone   Left handed   Caffeine: 2 ice tea a day   Social Determinants of Health   Financial Resource Strain: Low Risk  (02/22/2022)   Overall Financial Resource Strain (CARDIA)    Difficulty of Paying Living Expenses: Not hard at all  Food Insecurity: No Food Insecurity (06/19/2022)   Hunger Vital Sign    Worried About Programme researcher, broadcasting/film/video in  the Last Year: Never true    Cook in the Last Year: Never true  Transportation Needs: No Transportation Needs (06/19/2022)   PRAPARE - Hydrologist (Medical): No    Lack of Transportation (Non-Medical): No  Physical Activity: Not on file  Stress: No Stress Concern Present (02/22/2022)   [place]    Feeling of Stress : Only a little  Social Connections: Not on file  Intimate Partner Violence: Not At Risk (02/22/2022)   Humiliation, Afraid, Rape, and Kick questionnaire    Fear of Current or Ex-Partner: No    Emotionally Abused: No    Physically Abused: No    Sexually Abused: No      PHYSICAL EXAM Generalized: Well developed, in no acute distress   Neurological examination  Mentation: Alert oriented to time, place, history taking. Follows all commands speech and language fluent Cranial nerve II-XII:Extraocular movements were full. Facial symmetry noted.   DIAGNOSTIC DATA (LABS, IMAGING, TESTING) - I reviewed patient records, labs, notes, testing and imaging myself where available.  Lab Results  Component Value Date   WBC 5.4 07/01/2022   HGB 13.3 07/01/2022   HCT 38.6 (L) 07/01/2022   MCV 93.2 07/01/2022   PLT 174 07/01/2022      Component Value Date/Time   NA 140 07/01/2022 0352   K 4.1 07/01/2022 0352   CL 108 07/01/2022 0352   CO2 25 07/01/2022 0352   GLUCOSE 125 (H) 07/01/2022 0352   BUN 31 (H) 07/01/2022 0352   CREATININE 1.73 (H) 07/01/2022 0352   CALCIUM 9.3 07/01/2022 0352   PROT 6.1 (L) 07/01/2022 0352   ALBUMIN 3.7 07/01/2022 0352   AST 22 07/01/2022 0352   ALT 19 07/01/2022 0352   ALKPHOS 61 07/01/2022 0352   BILITOT 0.7 07/01/2022 0352   GFRNONAA 44 (L) 07/01/2022 0352   GFRAA >60 02/04/2017 0525   Lab Results  Component Value Date   CHOL 160 07/01/2022   HDL 46 07/01/2022   LDLCALC 105 (H) 07/01/2022   TRIG 45 07/01/2022   CHOLHDL 3.5 07/01/2022   Lab Results  Component Value Date   HGBA1C 4.9 07/01/2022   No results [name] for: "VITAMINB12" No results [name] for: "TSH"    ASSESSMENT AND PLAN 67 y.o. year old male  has a past medical history of Allergic rhinitis, Bipolar disorder ([place]), Bradycardia (2012), CKD (chronic kidney disease) stage 2, GFR 60-89 ml/min, Gout,  Hypertension, Mild renal insufficiency, Obesity, Prostatitis, Stroke ([place]), and Suicide attempt ([place]) (09/20/2014). here with:  Paresthesias left hand  -Increase gabapentin to 400 mg in the morning, 100 mg at noon and 400 mg at bedtime -Advised that this causes too much drowsiness he should let us know -Discussed potentially adding on another medication however the patient is on several medications that could potentially interact. - FU 6 months or sooner if needed    Ward Givens, MSN, NP-C 11/29/2022, 10:00 AM [facility] [address] [place], [place] [address] [phone]

## 2022-12-03 ENCOUNTER — Encounter: Payer: Self-pay | Admitting: Behavioral Health

## 2022-12-11 ENCOUNTER — Ambulatory Visit (INDEPENDENT_AMBULATORY_CARE_PROVIDER_SITE_OTHER): Payer: BC Managed Care – PPO | Admitting: Behavioral Health

## 2022-12-11 DIAGNOSIS — F3181 Bipolar II disorder: Secondary | ICD-10-CM

## 2022-12-11 NOTE — Progress Notes (Unsigned)
[facility] Counselor/Therapist Progress Note  Patient ID: Edwin [name], MRN: [id number],    Date: 12/13/2022  Time Spent: 55 minutes   Treatment Type: Individual Therapy  Reported Symptoms: Grief   Mental Status Exam:  Appearance:   Casual     Behavior:  Appropriate and Sharing  Motor:  Normal  Speech/Language:   Clear and Coherent  Affect:  Appropriate  Mood:  normal  Thought process:  normal  Thought content:    WNL  Sensory/Perceptual disturbances:    WNL  Orientation:  oriented to person  Attention:  Good  Concentration:  Good  Memory:  WNL  Fund of knowledge:   Good  Insight:    Good  Judgment:   Good  Impulse Control:  Good   Risk Assessment: Danger to Self:  No Self-injurious Behavior: No Danger to Others: No Duty to Warn:no Physical Aggression / Violence:No  Access to Firearms a concern: No  Gang Involvement:No   Subjective:   The patient reports "I am doing pretty well". He states finding things challenging with grieving for his pet. The patient states "The reality of death is not a pleasant thing". The patient reports "Any past struggle or mistreatment I have experienced is gone".  The patient reports since his pet has passed he is no longer interested in dwelling on the past he states interest with "Just loving people". 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 The patient reports crying last night due to having a vision of watching his pet die. The patient reports creating a collage of pictures to put in his home to honor his lost pet. The patient reports saying goodbye makes him feel "As long as I know he is okay I am okay. I expect to see him again". He identified something he would like for his pet to know is "The joy of finding him was a wonderful moment". He reports belief that he needs to live and walk in faith daily. The patient reports he continues to feel a great sense of  purpose and to play a positive factor in others lives. The patient reports belief that he has learned how to comfort others in there time of grief.  The patient reports interest with maintaining an emotional connection with his wife whom is in another country. He reports interest with discussing in future sessions how to maintain his connection with his wife. He reports he has received little information from immigration. He states all his wife needs is an interview and then she will be able to receive a VISA. The patient identified his take away from today's session as "Just being able to share I feel like God lead me to do this". The patient reports interest with making his next session in three weeks. The patient denied SI/HI, AH/VH.  The counselor conducted check in with the patient. The counselor discussed mental health symptoms he has experienced. The counselor processed grief and loss with the patient regarding the death of his pet. The counselor questioned how the patient can honor his pet and what he has learned from his pet. The counselor discussed what does saying goodbye mean to the patient. The counselor assessed for SI/HI, AH/VH.  Interventions: Grief Therapy  Diagnosis:   ICD-10-CM   1. Bipolar II disorder (Friend)  F31.81       Plan:   The patient reports to have intrusive  memories from a past relationship that makes him sad, and affects his sleep. He states belief that he is grieving the loss of the former relationship.    Long Term Goal: To begin a healthy grieving process around the loss of a former relationship. Short Term Goal: Explore and resolve grief and loss issues. Objective: Discuss issues of grief weekly with the therapist.  Objective: Continue to explore and resolve issues of grief/loss as they arise.    Jannifer Hick, [facility]

## 2022-12-13 ENCOUNTER — Encounter: Payer: Self-pay | Admitting: Behavioral Health

## 2022-12-17 DIAGNOSIS — G4734 Idiopathic sleep related nonobstructive alveolar hypoventilation: Secondary | ICD-10-CM | POA: Diagnosis not present

## 2022-12-25 ENCOUNTER — Ambulatory Visit: Payer: BC Managed Care – PPO | Admitting: Behavioral Health

## 2022-12-28 ENCOUNTER — Ambulatory Visit: Payer: BC Managed Care – PPO | Admitting: Cardiology

## 2022-12-28 DIAGNOSIS — D225 Melanocytic nevi of trunk: Secondary | ICD-10-CM | POA: Diagnosis not present

## 2022-12-28 DIAGNOSIS — L910 Hypertrophic scar: Secondary | ICD-10-CM | POA: Diagnosis not present

## 2022-12-28 DIAGNOSIS — L905 Scar conditions and fibrosis of skin: Secondary | ICD-10-CM | POA: Diagnosis not present

## 2022-12-30 ENCOUNTER — Other Ambulatory Visit: Payer: Self-pay | Admitting: Psychiatry

## 2022-12-30 DIAGNOSIS — F3181 Bipolar II disorder: Secondary | ICD-10-CM

## 2022-12-31 ENCOUNTER — Encounter: Payer: Self-pay | Admitting: Cardiology

## 2022-12-31 ENCOUNTER — Ambulatory Visit: Payer: BC Managed Care – PPO | Attending: Cardiology | Admitting: Cardiology

## 2022-12-31 VITALS — BP 115/62 | HR 68 | Ht 70.0 in | Wt 222.2 lb

## 2022-12-31 DIAGNOSIS — E782 Mixed hyperlipidemia: Secondary | ICD-10-CM

## 2022-12-31 DIAGNOSIS — I4589 Other specified conduction disorders: Secondary | ICD-10-CM | POA: Diagnosis not present

## 2022-12-31 DIAGNOSIS — N182 Chronic kidney disease, stage 2 (mild): Secondary | ICD-10-CM

## 2022-12-31 NOTE — Progress Notes (Signed)
Cardiology Office Note:    Date:  12/31/2022   ID:  Edwin [name], DOB 1956/02/13, MRN [id number]  PCP:  Josetta Huddle, MD  [facility] Cardiologist:  Candee Furbish, MD  [facility] Electrophysiologist:  None   Referring MD: Josetta Huddle, MD     History of Present Illness:    Edwin [name] is a 67 y.o. male here for the follow-up of hypertension and AV dissociation.  Hx of junctional escape rhythm with A-V dissociation possibly enhanced by lithium.  Has bipolar disorder.  Diltiazem was used for high blood pressure and was discontinued and he continued to have slow heart rates.    Cardiac catheterization in 2012 was reassuring.  Thankfully after discontinuing the lithium he felt better, felt more spontaneous on Depakote.  Dr. Clovis Pu is his psychiatrist.  He is a English as a second language teacher.  Masters in music.  Has a chemistry major from [place].  Had Dr. Verlene Mayer for organic chemistry.  Now in stage 3 CKD.  For his diet he has been seeing a nutritionist and trying to lose weight.  Currently he is on [place].  He had a ticket stop from the [employer] game where they scored 10 points and 8 seconds.  Was able to treat that for season tickets to [employer].  Overall he is doing well without any fevers chills nausea vomiting syncope bleeding.  Still teaching piano.    Past Medical History:  Diagnosis Date   Allergic rhinitis    Bipolar disorder ([place])    Bradycardia 2012    due to lithium   CKD (chronic kidney disease) stage 2, GFR 60-89 ml/min    Gout    Hypertension    Mild renal insufficiency    , with creatinine of 1.3 in 2010, was side effect of med   Obesity    ,moderate   Prostatitis    , Episodic   Stroke [facility])    Suicide attempt ([place]) 09/20/2014    Past Surgical History:  Procedure Laterality Date   APPENDECTOMY     CATARACT EXTRACTION Right    COLONOSCOPY WITH PROPOFOL N/A 06/14/2014   Procedure: COLONOSCOPY WITH PROPOFOL;  Surgeon: Garlan Fair, MD;   Location: [facility] ENDOSCOPY;  Service: Endoscopy;  Laterality: N/A;   TONSILLECTOMY     UMBILICAL HERNIA REPAIR      Current Medications: Current Meds  Medication Sig   amLODipine-atorvastatin (CADUET) 5-10 MG tablet Take 1 tablet by mouth daily.   Cholecalciferol 1000 units TBDP Take 1 tablet by mouth daily.    clopidogrel (PLAVIX) 75 MG tablet TAKE ONE TABLET BY MOUTH DAILY   dapagliflozin propanediol (FARXIGA) 10 MG TABS tablet Take 10 mg by mouth daily.   diclofenac Sodium (VOLTAREN) 1 % GEL Apply 2 g topically daily as needed (for knee pain).   divalproex (DEPAKOTE ER) 500 MG 24 hr tablet Take 2 tablets (1,000 mg total) by mouth at bedtime.   DULoxetine (CYMBALTA) 60 MG capsule Take 2 capsules (120 mg total) by mouth daily.   febuxostat (ULORIC) 40 MG tablet Take 40 mg by mouth daily.   furosemide (LASIX) 20 MG tablet Take 20 mg by mouth daily as needed for edema. May take 2 to 3 days weekly   gabapentin (NEURONTIN) 400 MG capsule Take 400 mg by mouth 2 (two) times daily.   glucosamine-chondroitin 500-400 MG tablet Take 1 tablet by mouth 1 day or 1 dose.   hydrALAZINE (APRESOLINE) 50 MG tablet Take 1 tablet (50 mg total)  by mouth 3 (three) times daily.   lamoTRIgine (LAMICTAL) 200 MG tablet TAKE ONE TABLET BY MOUTH IN THE MORNING   LevOCARNitine (L-CARNITINE) 500 MG TABS Take 500 mg by mouth 2 (two) times daily.   losartan (COZAAR) 100 MG tablet Take 100 mg by mouth daily.   metFORMIN (GLUCOPHAGE-XR) 500 MG 24 hr tablet Take 500 mg by mouth 2 (two) times daily.   multivitamin-lutein (OCUVITE-LUTEIN) CAPS capsule Take 1 capsule by mouth daily.   pramipexole (MIRAPEX) 0.5 MG tablet TAKE ONE TABLET BY MOUTH TWICE DAILY   tamsulosin (FLOMAX) 0.4 MG CAPS capsule Take 0.4 mg by mouth daily.     Allergies:   Allopurinol, Penicillins, Finerenone, Penicillin g, Aspirin, and Ciprofloxacin   Social History   Socioeconomic History   Marital status: Married    Spouse name: Sweet   Number of  children: 0   Years of education: Not on file   Highest education level: Master's degree (e.g., MA, MS, MEng, MEd, MSW, MBA)  Occupational History   Not on file  Tobacco Use   Smoking status: Never    Passive exposure: Never   Smokeless tobacco: Never  Substance and Sexual Activity   Alcohol use: No   Drug use: No   Sexual activity: Not on file  Other Topics Concern   Not on file  Social History Narrative   Lives alone   Left handed   Caffeine: 2 ice tea a day   Social Determinants of Health   Financial Resource Strain: Low Risk  (02/22/2022)   Overall Financial Resource Strain (CARDIA)    Difficulty of Paying Living Expenses: Not hard at all  Food Insecurity: No Food Insecurity (06/19/2022)   Hunger Vital Sign    Worried About Running Out of Food in the Last Year: Never true    Ran Out of Food in the Last Year: Never true  Transportation Needs: No Transportation Needs (06/19/2022)   PRAPARE - Hydrologist (Medical): No    Lack of Transportation (Non-Medical): No  Physical Activity: Not on file  Stress: No Stress Concern Present (02/22/2022)   [place]    Feeling of Stress : Only a little  Social Connections: Not on file     Family History: The patient's family history includes Alzheimer's disease in his father; Arrhythmia in his mother; Gout in his father; Hypertension in his father; Hypothyroidism in his mother; Mitral valve prolapse in his mother; Other in his mother; Stroke in his mother.  ROS:   Please see the history of present illness.    All other systems reviewed and are negative.  EKGs/Labs/Other Studies Reviewed:    LE Venous Duplex 09/11/2017: COMPARISON:  Bilateral lower extremity venous duplex examination on 09/03/2017.   FINDINGS: Right lower extremity: The right saphenofemoral junction is patent. Normal caliber of the right great saphenous vein without reflux.  No varicosities. Small right short saphenous vein without reflux.   Left lower extremity: The left saphenofemoral junction is patent. Normal caliber of the left great saphenous vein without reflux. Small left short saphenous vein without reflux. No varicosities.   Subcutaneous edema in the lower calves bilaterally.   IMPRESSION: Negative superficial venous examination. No evidence for superficial venous insufficiency or reflux within the bilateral lower extremity saphenous veins.  EKG:  EKG is personally reviewed and interpreted. 06/30/2022: Sinus rhythm no other abnormalities. 08/31/2021: Sinus rhythm. Rate 60 bpm. IVCD. 06/22/2020: sinus rhythm 68 incomplete right  bundle branch block no other abnormalities  06/08/2019-normal sinus rhythm 62 with nonspecific ST-T wave changes  Recent Labs: 07/01/2022: ALT 19; BUN 31; Creatinine, Ser 1.73; Hemoglobin 13.3; Platelets 174; Potassium 4.1; Sodium 140  Recent Lipid Panel    Component Value Date/Time   CHOL 160 07/01/2022 0352   TRIG 45 07/01/2022 0352   HDL 46 07/01/2022 0352   CHOLHDL 3.5 07/01/2022 0352   VLDL 9 07/01/2022 0352   LDLCALC 105 (H) 07/01/2022 0352    Physical Exam:    VS:  BP 115/62   Pulse 68   Ht 5' 10"$  (1.778 m)   Wt 222 lb 3.2 oz (100.8 kg)   SpO2 98%   BMI 31.88 kg/m     Wt Readings from Last 3 Encounters:  12/31/22 222 lb 3.2 oz (100.8 kg)  11/29/22 222 lb 6.4 oz (100.9 kg)  10/02/22 220 lb (99.8 kg)     GEN: Well nourished, well developed, in no acute distress HEENT: normal Neck: no JVD, carotid bruits, or masses Cardiac: RRR; no murmurs, rubs, or gallops,no edema  Respiratory:  clear to auscultation bilaterally, normal work of breathing GI: soft, nontender, nondistended, + BS MS: no deformity or atrophy Skin: warm and dry, no rash Neuro:  Alert and Oriented x 3, Strength and sensation are intact Psych: euthymic mood, full affect   ASSESSMENT:    1. AV dissociation   2. CKD (chronic kidney  disease), stage II   3. Mixed hyperlipidemia      PLAN:    In order of problems listed above:  AV dissociation Overall feels well without any symptoms.  No syncopal symptoms.  Once again, after stopping diltiazem and lithium condition improved.  Stable no high risk symptoms.  Essential hypertension Doing well with current medications.  No changes.  Does have a small degree of whitecoat hypertension.  Overall doing well today.  CKD (chronic kidney disease), stage II Has increased to stage III recently.  Creatinine went from 1.29 up to 1.7 range.  Avoid NSAIDs.  Continue with losartan.  Renal protection.  We discussed again.  One-year follow-up, he would like to continue to maintain visits given his prior electrical abnormality.  Follow-up:   12 months  Medication Adjustments/Labs and Tests Ordered: Current medicines are reviewed at length with the patient today.  Concerns regarding medicines are outlined above.  No orders of the defined types were placed in this encounter.   No orders of the defined types were placed in this encounter.    Patient Instructions  Medication Instructions:  The current medical regimen is effective;  continue present plan and medications.  *If you need a refill on your cardiac medications before your next appointment, please call your pharmacy*  Follow-Up: At [facility], you and your health needs are our priority.  As part of our continuing mission to provide you with exceptional heart care, we have created designated Provider Care Teams.  These Care Teams include your primary Cardiologist (physician) and Advanced Practice Providers (APPs -  Physician Assistants and Nurse Practitioners) who all work together to provide you with the care you need, when you need it.  We recommend signing up for the patient portal called "MyChart".  Sign up information is provided on this After Visit Summary.  MyChart is used to connect with patients for  Virtual Visits (Telemedicine).  Patients are able to view lab/test results, encounter notes, upcoming appointments, etc.  Non-urgent messages can be sent to your provider as well.  To learn more about what you can do with MyChart, go to [email or web address].    Your next appointment:   1 year(s)  Provider:   Candee Furbish, MD         Signed, Candee Furbish, MD  12/31/2022 9:30 AM    [facility]

## 2022-12-31 NOTE — Patient Instructions (Signed)
Medication Instructions:  The current medical regimen is effective;  continue present plan and medications.  *If you need a refill on your cardiac medications before your next appointment, please call your pharmacy*  Follow-Up: At [facility], you and your health needs are our priority.  As part of our continuing mission to provide you with exceptional heart care, we have created designated Provider Care Teams.  These Care Teams include your primary Cardiologist (physician) and Advanced Practice Providers (APPs -  Physician Assistants and Nurse Practitioners) who all work together to provide you with the care you need, when you need it.  We recommend signing up for the patient portal called "MyChart".  Sign up information is provided on this After Visit Summary.  MyChart is used to connect with patients for Virtual Visits (Telemedicine).  Patients are able to view lab/test results, encounter notes, upcoming appointments, etc.  Non-urgent messages can be sent to your provider as well.   To learn more about what you can do with MyChart, go to [email or web address].    Your next appointment:   1 year(s)  Provider:   Candee Furbish, MD

## 2023-01-01 ENCOUNTER — Ambulatory Visit (INDEPENDENT_AMBULATORY_CARE_PROVIDER_SITE_OTHER): Payer: BC Managed Care – PPO | Admitting: Behavioral Health

## 2023-01-01 DIAGNOSIS — F3181 Bipolar II disorder: Secondary | ICD-10-CM | POA: Diagnosis not present

## 2023-01-01 NOTE — Progress Notes (Signed)
      [facility] Counselor/Therapist Progress Note  Patient ID: Edwin [name], MRN: [id number],    Date: 01/01/2023  Time Spent: 50 minutes   Treatment Type: Individual Therapy  Reported Symptoms: None reported   Mental Status Exam:  Appearance:   Casual     Behavior:  Appropriate and Sharing  Motor:  Normal  Speech/Language:   Clear and Coherent  Affect:  Appropriate and Congruent  Mood:  normal  Thought process:  normal  Thought content:    WNL  Sensory/Perceptual disturbances:    WNL  Orientation:  oriented to person  Attention:  Good  Concentration:  Good  Memory:  WNL  Fund of knowledge:   Good  Insight:    Good  Judgment:   Good  Impulse Control:  Good   Risk Assessment: Danger to Self:  No Self-injurious Behavior: No Danger to Others: No Duty to Warn:no Physical Aggression / Violence:No  Access to Firearms a concern: No  Gang Involvement:No   Subjective:   The patient reports "I been doing very well". He denied experiencing depression and reports feeling good. The patient states not to remember the pain of losing his pet cat as much as time progresses. The patient reflected on a past memory of when he initially was seeking mental health treatment. He reports "I am the most at peace with myself than I've been in a long time". The patient states belief that the passing of his pet cat has allowed him to practice acceptance and let go of several resentments and past hurts. He reports "I feel whole". He states he continues to talk with his wife and expressed gratitude with being able to emotionally connect with his wife. He reports to talk with his wife daily.   The patient reports interest with today being his last session due to his progress in therapy and reports interest with receiving therapy as needed. The patient identified coping strategies as his spirituality. Patient states he does not have room in his life for negativity and states he has experienced  healing. Patient expressed gratitude with what he has to look forward to in his life. The patient reports he continues to remain medication compliant. The patient denied experiencing SI/HI, AH/VH.     Counselor conducted check in. Counselor utilized reflective listening skills and acknowledged and validated the patients feelings. Counselor assessed the patients mental health and coping strategies utilized. Counselor offered praise for the patients progress. Counselor discussed the patients frequency of attending therapy.  Counselor assessed for SI/HI,AH/VH.   Interventions: Grief Therapy  Diagnosis:   ICD-10-CM   1. Bipolar II disorder ([place])  F31.81       Plan:   The patient reports to have intrusive memories from a past relationship that makes him sad, and affects his sleep. He states belief that he is grieving the loss of the former relationship.    Long Term Goal: To begin a healthy grieving process around the loss of a former relationship. Short Term Goal: Explore and resolve grief and loss issues. Objective: Discuss issues of grief weekly with the therapist.  Objective: Continue to explore and resolve issues of grief/loss as they arise.   Jannifer Hick, [facility]

## 2023-01-06 ENCOUNTER — Encounter: Payer: Self-pay | Admitting: Behavioral Health

## 2023-01-08 ENCOUNTER — Ambulatory Visit: Payer: BC Managed Care – PPO | Admitting: Behavioral Health

## 2023-01-17 DIAGNOSIS — G4734 Idiopathic sleep related nonobstructive alveolar hypoventilation: Secondary | ICD-10-CM | POA: Diagnosis not present

## 2023-01-18 ENCOUNTER — Telehealth: Payer: Self-pay | Admitting: Pulmonary Disease

## 2023-01-18 NOTE — Telephone Encounter (Signed)
Called and spoke with pt about info stated at last OV about the oral appliance. Pt said he spoke with his dentist and they are able to fit him for the oral appliance without him needing a referral to either Dr. Ron Parker or [employer]okey. Nothing further needed.

## 2023-01-21 ENCOUNTER — Encounter: Payer: Self-pay | Admitting: Psychiatry

## 2023-01-21 ENCOUNTER — Ambulatory Visit (INDEPENDENT_AMBULATORY_CARE_PROVIDER_SITE_OTHER): Payer: BC Managed Care – PPO | Admitting: Psychiatry

## 2023-01-21 DIAGNOSIS — F4310 Post-traumatic stress disorder, unspecified: Secondary | ICD-10-CM

## 2023-01-21 DIAGNOSIS — F3181 Bipolar II disorder: Secondary | ICD-10-CM

## 2023-01-21 DIAGNOSIS — G4733 Obstructive sleep apnea (adult) (pediatric): Secondary | ICD-10-CM | POA: Diagnosis not present

## 2023-01-21 NOTE — Progress Notes (Signed)
Edwin [name] [id number] 04-14-1956 67 y.o.  Subjective:   Patient ID:  Edwin [name] is a 67 y.o. (DOB Sep 24, 1956) male.  Chief Complaint:  Chief Complaint  Patient presents with   Follow-up   Drug Problem   Anxiety    Depression        Associated symptoms include no decreased concentration and no suicidal ideas.  [name] presents to the office today for follow-up of bipolar depression and anxiety.  seen July 02, 2019.  No meds were changed.  Patient was doing well.  He asked me to write a letter to his attorney and support his intention to marry a woman from overseas and that his mental health was sufficiently well that he would not represent a danger to his fiance.  This was discussed with his attorney and the letter was written.  Met girl June 8. Met her on dating site international.  [place] woman and decided wanted to get married.  Options opening up after being shut down by Covid.  seen November 2020.  No meds were changed.  He was overall doing well.  seen January 20, 2020 .  No med changed.  Following noted. Wonders about elevated liver enzymes and Depakote.  Labs not visible in Epic.  PCP Dr. Gavin Pound is working up this finding.   Doing great from mental health.  Talks daily to GF in [place].    03/23/20 the following is noted:    Taken on too many students.  Recognizes needs to have 2 days off a week and plans to schedule that.  Needs to get through end of the semester.  Fiance visa is difficult but she just got admitted to [facility] pending some exams being passed.  Visa difficult with Covid affecting travel.  She could be here in mid-July. No med changes  07/06/20 with the following noted: Still good. Pramipexole has kept depression at [place]. Mood is still stable. Good interest in basketball. Still concerned about weight and blames meds and wonders about weight loss meds and supplements.  Asked about L-glutamine taken it for a week.  Trying to watch diet.   Hasn't weighed himself. Plan: No med changes indicated yet.  Sig risk in switching his primary mood stabilizer unless its absolutely nececessary.  10/05/20 appt noted: Consistent with meds.  Still doing great overall.  Some trouble sleeping and got help with CPAP with Dr. Maxwell Caul.  Tendency to sleep 3 hours and awakened.  May stay awake 1-2 hours and eventually get sufficient sleep.  Can awaken refreshed. No SE except weight gain.   Got denied for Ozempic.  Very concerned about weight gain with Depakote. Still facetiming with GF in [place] and no visa for her yet bc country still shut down. Plan no med changes  01/05/2021 appointment with the following noted: Expressed concerns about weight and new dx DM.   Disc retrying efforts to get Ozempic.   Has increase glucose.   Been on phone with GF in [place] for 20 mos.  It's dragging on trying to get her here.  Thinking about going there.   Previously lost the love of his life DT his illness.  Sense of loss bc her birthday was a couple of days ago.    05/03/2021 appointment with the following noted: Doing as well as anyone can be doing.  Flew to [place] in April.  People were nice.  Enjoyed the family and went with 27 people to the beach.  GF comes from  family of 25.  Going back in July and wedding July 30.  Had Face Timed for 22 mos and gotten to know the family.  She has not been to [place] yet but they plan to live here.  Spousal visas are back logged.   Has seen PCP and dietician and is losing weight. Plan: No med changes indicated yet.  Sig risk in switchin g his primary mood stabilizer unless its absolutely nececessary. Failed attempt to reduce pramipexole below 0.5 mg BID  08/30/21 appt noted: Pretty well.  Got married but she's not here yet.  Working on getting her into the [place].  Has a good immigration attorney here.  Usually takes a just a couple of mos but maybe longer  DT backlog.   Still taking meds Depakote 1000, duloxetine 120,  lamotrigine 200, pramipexole 0.5 mg BID.   Stopped soda.  Working with nutritionist and down 35#. 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   Disc psychology and masculinity goals. No SE.   Satisfied with meds and no indication to change. Plan: continue Depakote 1000, duloxetine 120, lamotrigine 200, pramipexole 0.5 mg BID.   Failed attempt to reduce pramipexole below 0.5 mg BID  01/01/22 appt noted: Going to [employer].   Very good mental health.   New wife is delayed getting here bc slow immigration.  She's in [place] right now.   Got married in [place].   OK with meds. CKD stable. Plan: No med changes indicated yet.  Sig risk in switching his primary mood stabilizer unless its absolutely nececessary. Depakote 1000, duloxetine 120, lamotrigine 200, pramipexole 0.5 mg BID.    05/01/2022 appointment with the following noted: March 15 dizzy and dx with stroke.  Thinks it might be related to untreated OSA bc CPAP machine burned up and couldn't get another.  O2 levels low in hospital in sleep. Appt  to see sleep doc soon.  Sleeping with O2 now.   No depression occurred.  Handled it well.  Lost 52# this year. Thankful for meds.   Sees Dr. Maureen Chatters 7/6 Applied for expedited Visa for wife. Plan: No med changes indicated.  Sig risk in switching his primary mood stabilizer unless its absolutely nececessary. Depakote 1000, duloxetine 120, lamotrigine 200, pramipexole 0.5 mg BID.  Gabapentin 400 BID Failed attempt to reduce pramipexole below 0.5 mg BID  09/17/22 appt noted: 06/30/22 ED stroke  then had rehab. Stroke in March mild but not again in 8/23.  Slowly better. Walking 2-4 miles daily and it has helped. Enjoys [employer] sports Patient reports stable mood and denies depressed or irritable moods.   Patient denies any recent difficulty with anxiety.  Patient denies difficulty with sleep initiation or maintenance.  Not Using CPAP some issues getting O2.. Sleep hygiene is  better now and that' s helped.  Going to bed earlier.   Denies appetite disturbance.  Patient reports that energy and motivation have been good.  Patient denies any difficulty with concentration.  Patient denies any suicidal ideation. Mood good wihtout swings or depression.   Satisfied with meds. No alcohol.   Plan no med changes from above  01/21/23 appt noted: Continues meds as above.  Tolerating meds except constipation.  Miralax helping. Been doing good but youngest cat died and he is grieving.  It was his favorite pet.  Didn't have to put her down.  Happened 2 mos ago.  His cat was Louie.  He was a happy cat.   No mood swings.  Been going to [facility] basketball games.   Visa process is going slow to get sig other (wife) here from [employer]. Taking a long time.   No mood swings.     M died 07/28/2019.  Was a relief for her.    Past Psychiatric Medication Trials: Lithium increased Cr and switch to VPA 2012,  Depakote 1000, lamotrigine,  gabapentin, Vraylar tremor, Rexulti SE, Abilify increased weight,   Wellbutrin NR, phentermine,   Pramipexole 0.5 mg BID L-Carnitine  Psych hosp 1986 helpful   Review of Systems:  Review of Systems  Constitutional:  Negative for unexpected weight change.  Cardiovascular:  Negative for palpitations.  Musculoskeletal:  Positive for arthralgias.  Neurological:  Negative for tremors and weakness.  Psychiatric/Behavioral:  Negative for agitation, behavioral problems, confusion, decreased concentration, hallucinations, self-injury, sleep disturbance and suicidal ideas. The patient is not hyperactive.   No longer depressed.   Medications: I have reviewed the patient's current medications.  Current Outpatient Medications  Medication Sig Dispense Refill   amLODipine-atorvastatin (CADUET) 5-10 MG tablet Take 1 tablet by mouth daily.     Cholecalciferol 1000 units TBDP Take 1 tablet by mouth daily.      clopidogrel (PLAVIX) 75 MG tablet TAKE ONE  TABLET BY MOUTH DAILY 90 tablet 1   dapagliflozin propanediol (FARXIGA) 10 MG TABS tablet Take 10 mg by mouth daily.     diclofenac Sodium (VOLTAREN) 1 % GEL Apply 2 g topically daily as needed (for knee pain).     divalproex (DEPAKOTE ER) 500 MG 24 hr tablet Take 2 tablets (1,000 mg total) by mouth at bedtime. 180 tablet 1   DULoxetine (CYMBALTA) 60 MG capsule Take 2 capsules (120 mg total) by mouth daily. 180 capsule 1   febuxostat (ULORIC) 40 MG tablet Take 40 mg by mouth daily.     furosemide (LASIX) 20 MG tablet Take 20 mg by mouth daily as needed for edema. May take 2 to 3 days weekly     gabapentin (NEURONTIN) 400 MG capsule Take 400 mg by mouth 2 (two) times daily.     glucosamine-chondroitin 500-400 MG tablet Take 1 tablet by mouth 1 day or 1 dose.     hydrALAZINE (APRESOLINE) 50 MG tablet Take 1 tablet (50 mg total) by mouth 3 (three) times daily.     lamoTRIgine (LAMICTAL) 200 MG tablet TAKE ONE TABLET BY MOUTH IN THE MORNING 60 tablet 0   LevOCARNitine (L-CARNITINE) 500 MG TABS Take 500 mg by mouth 2 (two) times daily.     losartan (COZAAR) 100 MG tablet Take 100 mg by mouth daily.     metFORMIN (GLUCOPHAGE-XR) 500 MG 24 hr tablet Take 500 mg by mouth 2 (two) times daily.     multivitamin-lutein (OCUVITE-LUTEIN) CAPS capsule Take 1 capsule by mouth daily.     pramipexole (MIRAPEX) 0.5 MG tablet TAKE ONE TABLET BY MOUTH TWICE DAILY 120 tablet 0   tamsulosin (FLOMAX) 0.4 MG CAPS capsule Take 0.4 mg by mouth daily.     No current facility-administered medications for this visit.    Medication Side Effects: Other: weight gain.  Allergies:  Allergies  Allergen Reactions   Allopurinol Other (See Comments)    Made pt emotionally unstable  Other reaction(s): emotionally labile   Penicillins Hives and Other (See Comments)    Has patient had a PCN reaction causing immediate rash, facial/tongue/throat swelling, SOB or lightheadedness with hypotension: No Has patient had a PCN reaction  causing severe rash involving mucus membranes  or skin necrosis: No Has patient had a PCN reaction that required hospitalization No Has patient had a PCN reaction occurring within the last 10 years: No If all of the above answers are "NO", then may proceed with Cephalosporin use.   Finerenone Other (See Comments)    hyperkalemia Other reaction(s): Increases  potassium levels   Penicillin G     Other reaction(s): hives   Aspirin Hives   Ciprofloxacin Rash    Other reaction(s): rash    Past Medical History:  Diagnosis Date   Allergic rhinitis    Bipolar disorder ([place])    Bradycardia 2012    due to lithium   CKD (chronic kidney disease) stage 2, GFR 60-89 ml/min    Gout    Hypertension    Mild renal insufficiency    , with creatinine of 1.3 in 2010, was side effect of med   Obesity    ,moderate   Prostatitis    , Episodic   Stroke ([place])    Suicide attempt ([place]) 09/20/2014    Family History  Problem Relation Age of Onset   Other Mother        Viral Meningitis   Mitral valve prolapse Mother    Arrhythmia Mother    Hypothyroidism Mother    Stroke Mother    Hypertension Father    Gout Father    Alzheimer's disease Father     Social History   Socioeconomic History   Marital status: Married    Spouse name: Sweet   Number of children: 0   Years of education: Not on file   Highest education level: Master's degree (e.g., MA, MS, MEng, MEd, MSW, MBA)  Occupational History   Not on file  Tobacco Use   Smoking status: Never    Passive exposure: Never   Smokeless tobacco: Never  Substance and Sexual Activity   Alcohol use: No   Drug use: No   Sexual activity: Not on file  Other Topics Concern   Not on file  Social History Narrative   Lives alone   Left handed   Caffeine: 2 ice tea a day   Social Determinants of Health   Financial Resource Strain: Low Risk  (02/22/2022)   Overall Financial Resource Strain (CARDIA)    Difficulty of Paying Living Expenses: Not  hard at all  Food Insecurity: No Food Insecurity (06/19/2022)   Hunger Vital Sign    Worried About Running Out of Food in the Last Year: Never true    Ran Out of Food in the Last Year: Never true  Transportation Needs: No Transportation Needs (06/19/2022)   PRAPARE - Hydrologist (Medical): No    Lack of Transportation (Non-Medical): No  Physical Activity: Not on file  Stress: No Stress Concern Present (02/22/2022)   [place]    Feeling of Stress : Only a little  Social Connections: Not on file  Intimate Partner Violence: Not At Risk (02/22/2022)   Humiliation, Afraid, Rape, and Kick questionnaire    Fear of Current or Ex-Partner: No    Emotionally Abused: No    Physically Abused: No    Sexually Abused: No    Past Medical History, Surgical history, Social history, and Family history were reviewed and updated as appropriate.   Please see review of systems for further details on the patient's review from today.   Objective:   Physical Exam:  There were no vitals taken  for this visit.  Physical Exam Constitutional:      General: He is not in acute distress.    Appearance: He is well-developed.  Musculoskeletal:        General: No deformity.  Neurological:     Mental Status: He is alert and oriented to person, place, and time.     Motor: No tremor.     Coordination: Coordination normal.     Gait: Gait normal.  Psychiatric:        Attention and Perception: Attention normal. He is attentive.        Mood and Affect: Mood is not anxious or depressed. Affect is not labile, blunt, angry or inappropriate.        Speech: Speech is not rapid and pressured.        Behavior: Behavior normal.        Thought Content: Thought content normal. Thought content is not delusional. Thought content does not include homicidal or suicidal ideation. Thought content does not include suicidal plan.         Cognition and Memory: Cognition normal.        Judgment: Judgment normal.     Comments: Insight is good.  Some situational anxiety managed Talkative and pleasant. grief     Lab Review:     Component Value Date/Time   NA 140 07/01/2022 0352   K 4.1 07/01/2022 0352   CL 108 07/01/2022 0352   CO2 25 07/01/2022 0352   GLUCOSE 125 (H) 07/01/2022 0352   BUN 31 (H) 07/01/2022 0352   CREATININE 1.73 (H) 07/01/2022 0352   CALCIUM 9.3 07/01/2022 0352   PROT 6.1 (L) 07/01/2022 0352   ALBUMIN 3.7 07/01/2022 0352   AST 22 07/01/2022 0352   ALT 19 07/01/2022 0352   ALKPHOS 61 07/01/2022 0352   BILITOT 0.7 07/01/2022 0352   GFRNONAA 44 (L) 07/01/2022 0352   GFRAA >60 02/04/2017 0525       Component Value Date/Time   WBC 5.4 07/01/2022 0352   RBC 4.14 (L) 07/01/2022 0352   HGB 13.3 07/01/2022 0352   HCT 38.6 (L) 07/01/2022 0352   PLT 174 07/01/2022 0352   MCV 93.2 07/01/2022 0352   MCH 32.1 07/01/2022 0352   MCHC 34.5 07/01/2022 0352   RDW 11.9 07/01/2022 0352   LYMPHSABS 1.5 06/30/2022 1630   MONOABS 0.7 06/30/2022 1630   EOSABS 0.2 06/30/2022 1630   BASOSABS 0.0 06/30/2022 1630    No results found for: "POCLITH", "LITHIUM"   Lab Results  Component Value Date   VALPROATE 35 (L) 07/01/2022    Sleep study dx severe OSA AHI 53.   .res Assessment: Plan:    Brndon was seen today for follow-up, drug problem and anxiety.  Diagnoses and all orders for this visit:  Bipolar II disorder ([place])  PTSD (post-traumatic stress disorder)  Obstructive sleep apnea  Greater than 50% of 30 min face to face time with patient was spent on counseling and coordination of care. We discussed  Mood remains stable . Switch from lithium to VPA in 2012 was followed up with CMP which was normal at the time except for elevated ammonia level.  Which was the reason for adding L-carnitine and this corrected the ammonia level.   Elevated liver enzymes resolved.  Supportive therapy dealing with his  goal to get new wife to [place].  Also disc stages of grief losing his favorite his favorite pet, Neurosurgeon, William Hamburger.      Good response to the med  combination.  Polypharmacy necessary.   Since 2017 on pramipexole has been much better with regard to mood stability.  He feels it causes weight gain but cannot maintain stability bc of it.  Using O2 consistently and he requires less sleep.  Disc sleep hygiene and getting adequate quantity for mental health.  Sleep deprivation increases risk mania.   No med changes indicated.  Sig risk in switching his primary mood stabilizer unless its absolutely nececessary. Depakote 1000, duloxetine 120, lamotrigine 200, pramipexole 0.5 mg BID.  Still on gabapentin 400 mg BID  Failed attempt to reduce pramipexole below 0.5 mg BID Disc risk impulsivity and mania with this but he's had depression benefit without problems.  Constipation management 1.  Lots of water 2.  Powdered fiber supplement such as MiraLAX, Citrucel, etc. preferably with a meal 3.  2 stool softeners a day 4.  Milk of magnesia or magnesium tablets if needed  Elevated liver enzymes resolved.  30 min  FU 4 mos  Lynder Parents, MD, DFAPA   Please see After Visit Summary for patient specific instructions.  Future Appointments  Date Time Provider [place]  01/22/2023 10:00 AM Jannifer Hick, [facility] None  05/02/2023  1:00 PM Garvin Fila, MD [facility] None     No orders of the defined types were placed in this encounter.      -------------------------------

## 2023-01-22 ENCOUNTER — Ambulatory Visit: Payer: BC Managed Care – PPO | Admitting: Behavioral Health

## 2023-01-22 ENCOUNTER — Telehealth (HOSPITAL_BASED_OUTPATIENT_CLINIC_OR_DEPARTMENT_OTHER): Payer: Self-pay | Admitting: Pulmonary Disease

## 2023-01-22 NOTE — Telephone Encounter (Signed)
Pt called and stated that he received oral appliance for sleep apnea and is not ready to schedule HST. Please advise and call pt back.

## 2023-01-22 NOTE — Progress Notes (Deleted)
The patient no showed today's scheduled appointment.

## 2023-01-23 NOTE — Telephone Encounter (Signed)
ATC pt LVM for him to call us in 3-4 weeks per RA to schedule a HST

## 2023-01-23 NOTE — Telephone Encounter (Signed)
PT has started using the oral appliance and finds it very comfortable. Per Dr. Bari Mantis notes, please sched a home sleep study for him as it will be more than the 3-4 weeks in to usage of appliance. I said we would call him.   Notes indicate:   ATC pt LVM for him to call us in 3-4 weeks per RA to schedule a HST

## 2023-01-26 ENCOUNTER — Other Ambulatory Visit: Payer: Self-pay | Admitting: Psychiatry

## 2023-01-26 DIAGNOSIS — F3181 Bipolar II disorder: Secondary | ICD-10-CM

## 2023-01-26 DIAGNOSIS — F4310 Post-traumatic stress disorder, unspecified: Secondary | ICD-10-CM

## 2023-01-28 ENCOUNTER — Telehealth: Payer: Self-pay | Admitting: Pulmonary Disease

## 2023-01-28 DIAGNOSIS — G4733 Obstructive sleep apnea (adult) (pediatric): Secondary | ICD-10-CM

## 2023-01-28 NOTE — Telephone Encounter (Signed)
Called and spoke with pt who states that he no longer needed to have referral to either Dr. Ron Parker or Dr. Toy Cookey for oral appliance as his dentist was able to get him fitted for the oral appliance.  For insurance purposes, pt needs to have an order placed so that way the insurance will cover this. Pt's dentist is Marylyn Ishihara with [facility] at phone number [phone].  Referral to orthodontics with this info has been placed for pt.  Pt also is wanting to know if he can repeat the sleep study while using the oral appliance to see what it shows in regards to the OSA. Dr. Elsworth Soho, please advise on this for pt.

## 2023-01-28 NOTE — Telephone Encounter (Signed)
Please see last signed encounter. PT still wondering about the answer to this:  Called and spoke with pt about info stated at last OV about the oral appliance. Pt said he spoke with his dentist and they are able to fit him for the oral appliance without him needing a referral to either Dr. Ron Parker or Toy Cookey. Nothing further needed.      He has already been fitted for one with his own dentist and because it is his own dentist he needs a  referral letter now for insurance.   Also, he may need to set up a HST now that he has been wearing the appliance about 2 weeks. Please address this also.  Call him @ [phone]

## 2023-01-29 ENCOUNTER — Telehealth (HOSPITAL_BASED_OUTPATIENT_CLINIC_OR_DEPARTMENT_OTHER): Payer: Self-pay | Admitting: Pulmonary Disease

## 2023-01-29 NOTE — Telephone Encounter (Signed)
Edwin Noel, MD  You; [facility] Triage Pool11 hours ago (9:02 PM)    Thank you for placing the referral. Okay to order home sleep test with him wearing the oral appliance    Order placed for the sleep study to be done with pt wearing the oral appliance. Attempted to call pt but unable to reach. Left a detailed message letting him know this had been done. Nothing further needed.

## 2023-01-29 NOTE — Telephone Encounter (Signed)
Pt was just checking to see if there is any way to speed up the HST wait. Pt was verbally told while on the phone 12 weeks from [place] before hearing from [place]. Please advise.

## 2023-02-01 ENCOUNTER — Encounter: Payer: Self-pay | Admitting: Neurology

## 2023-02-04 DIAGNOSIS — I6930 Unspecified sequelae of cerebral infarction: Secondary | ICD-10-CM | POA: Diagnosis not present

## 2023-02-04 DIAGNOSIS — E1169 Type 2 diabetes mellitus with other specified complication: Secondary | ICD-10-CM | POA: Diagnosis not present

## 2023-02-04 DIAGNOSIS — R202 Paresthesia of skin: Secondary | ICD-10-CM | POA: Diagnosis not present

## 2023-02-04 DIAGNOSIS — E559 Vitamin D deficiency, unspecified: Secondary | ICD-10-CM | POA: Diagnosis not present

## 2023-02-04 DIAGNOSIS — E785 Hyperlipidemia, unspecified: Secondary | ICD-10-CM | POA: Diagnosis not present

## 2023-02-04 DIAGNOSIS — N183 Chronic kidney disease, stage 3 unspecified: Secondary | ICD-10-CM | POA: Diagnosis not present

## 2023-02-04 DIAGNOSIS — I10 Essential (primary) hypertension: Secondary | ICD-10-CM | POA: Diagnosis not present

## 2023-02-05 ENCOUNTER — Ambulatory Visit (INDEPENDENT_AMBULATORY_CARE_PROVIDER_SITE_OTHER): Payer: BC Managed Care – PPO

## 2023-02-05 DIAGNOSIS — N183 Chronic kidney disease, stage 3 unspecified: Secondary | ICD-10-CM | POA: Diagnosis not present

## 2023-02-05 DIAGNOSIS — G4733 Obstructive sleep apnea (adult) (pediatric): Secondary | ICD-10-CM

## 2023-02-06 ENCOUNTER — Telehealth: Payer: Self-pay | Admitting: Pulmonary Disease

## 2023-02-06 NOTE — Telephone Encounter (Signed)
Device only has 41 mins recorded of the pt's sleep. Will need to sch a repeat study

## 2023-02-06 NOTE — Telephone Encounter (Signed)
PT does not think he slept long enough for a good reading on his HST. Pls call pt to advise @ [phone]

## 2023-02-08 DIAGNOSIS — E1122 Type 2 diabetes mellitus with diabetic chronic kidney disease: Secondary | ICD-10-CM | POA: Diagnosis not present

## 2023-02-08 DIAGNOSIS — I129 Hypertensive chronic kidney disease with stage 1 through stage 4 chronic kidney disease, or unspecified chronic kidney disease: Secondary | ICD-10-CM | POA: Diagnosis not present

## 2023-02-08 DIAGNOSIS — N183 Chronic kidney disease, stage 3 unspecified: Secondary | ICD-10-CM | POA: Diagnosis not present

## 2023-02-08 DIAGNOSIS — D631 Anemia in chronic kidney disease: Secondary | ICD-10-CM | POA: Diagnosis not present

## 2023-02-15 DIAGNOSIS — G4734 Idiopathic sleep related nonobstructive alveolar hypoventilation: Secondary | ICD-10-CM | POA: Diagnosis not present

## 2023-02-19 NOTE — Telephone Encounter (Signed)
c 

## 2023-02-20 ENCOUNTER — Encounter (HOSPITAL_BASED_OUTPATIENT_CLINIC_OR_DEPARTMENT_OTHER): Payer: BC Managed Care – PPO

## 2023-02-20 DIAGNOSIS — G4733 Obstructive sleep apnea (adult) (pediatric): Secondary | ICD-10-CM | POA: Diagnosis not present

## 2023-02-21 DIAGNOSIS — M25562 Pain in left knee: Secondary | ICD-10-CM | POA: Diagnosis not present

## 2023-02-21 DIAGNOSIS — M1009 Idiopathic gout, multiple sites: Secondary | ICD-10-CM | POA: Diagnosis not present

## 2023-02-21 DIAGNOSIS — M1991 Primary osteoarthritis, unspecified site: Secondary | ICD-10-CM | POA: Diagnosis not present

## 2023-02-21 DIAGNOSIS — M25561 Pain in right knee: Secondary | ICD-10-CM | POA: Diagnosis not present

## 2023-02-21 DIAGNOSIS — Z79899 Other long term (current) drug therapy: Secondary | ICD-10-CM | POA: Diagnosis not present

## 2023-02-22 NOTE — Telephone Encounter (Signed)
New study is 2 hrs and 50 mins. Sent you a message and printed study for you to read at [facility]

## 2023-02-24 ENCOUNTER — Other Ambulatory Visit: Payer: Self-pay | Admitting: Psychiatry

## 2023-02-24 DIAGNOSIS — F3181 Bipolar II disorder: Secondary | ICD-10-CM

## 2023-02-26 ENCOUNTER — Telehealth (HOSPITAL_BASED_OUTPATIENT_CLINIC_OR_DEPARTMENT_OTHER): Payer: Self-pay | Admitting: Pulmonary Disease

## 2023-02-26 DIAGNOSIS — G4733 Obstructive sleep apnea (adult) (pediatric): Secondary | ICD-10-CM | POA: Diagnosis not present

## 2023-02-26 NOTE — Telephone Encounter (Signed)
HST showed moderate  OSA with AHI 17/ hr  This was a repeat study-original study and only recording for 41 minutes-this study only on recording for less than 3 hours He had moderate OSA, events were mostly noted in the supine position.  Please schedule office visit with me to discuss options -Avoid supine position and continue oral appliance -Try CPAP titration study

## 2023-02-27 ENCOUNTER — Encounter (HOSPITAL_BASED_OUTPATIENT_CLINIC_OR_DEPARTMENT_OTHER): Payer: Self-pay | Admitting: Pulmonary Disease

## 2023-02-27 NOTE — Telephone Encounter (Signed)
ATC patient to get him scheduled for an office visit. LVMTCB.

## 2023-02-27 NOTE — Telephone Encounter (Signed)
Patient just wants to know if the oral appliance that he has been using has helped at all. Pt sch 5/8 for OV. Please advise and call patient back.

## 2023-03-04 NOTE — Telephone Encounter (Signed)
ATC patient. Per DPR, left detailed vm letting patient know what Dr. Vassie Loll said. Advised patient to give [place] a call if he any other questions.   Nothing further needed.

## 2023-03-04 NOTE — Telephone Encounter (Signed)
RA, please advise.  

## 2023-03-27 ENCOUNTER — Encounter (HOSPITAL_BASED_OUTPATIENT_CLINIC_OR_DEPARTMENT_OTHER): Payer: Self-pay | Admitting: Pulmonary Disease

## 2023-03-27 ENCOUNTER — Ambulatory Visit (HOSPITAL_BASED_OUTPATIENT_CLINIC_OR_DEPARTMENT_OTHER): Payer: BC Managed Care – PPO | Admitting: Pulmonary Disease

## 2023-03-27 VITALS — BP 122/70 | HR 70 | Ht 70.0 in | Wt 229.6 lb

## 2023-03-27 DIAGNOSIS — G4733 Obstructive sleep apnea (adult) (pediatric): Secondary | ICD-10-CM | POA: Diagnosis not present

## 2023-03-27 NOTE — Patient Instructions (Signed)
Dental appliance works partially - decreases events from 26 to 17 per hour Avoid sleeping on your back  Check out zzoma

## 2023-03-27 NOTE — Progress Notes (Signed)
   Subjective:    Patient ID: Edwin [name], male    DOB: Aug 14, 1956, 67 y.o.   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 He had severe OSA and more recent home sleep test showed moderate OSA which is improved in the left lateral position.  After his initial visit 11/2022 , he had a dental appliance made and has been sleeping with this.  He used retainer for many years and is very compliant. He feels rested in the morning. We repeated sleep study and reviewed this today Weight is increased 7 pounds   Significant tests/ events reviewed  CT abdomen/pelvis 01/2017 mild atelectasis and bronchiectasis of both lung bases   02/2018 HST -[facility] -AHI 53/hour, low saturation 58%   06/2022 HST [neurology]-pAHI 26/hour, low sat 80%, prone AHI 24/hour, supine AHI 15/hour, left side 2.6/hour , 70 minutes  02/2023 HST (with dental appliance] showed moderate  OSA with AHI 17/ hr , TST <3h   ONO on room air 09/20/2022 -total recording time 3 hours 16 minutes, saturation less than 88% for 1 hour  Review of Systems neg for any significant sore throat, dysphagia, itching, sneezing, nasal congestion or excess/ purulent secretions, fever, chills, sweats, unintended wt loss, pleuritic or exertional cp, hempoptysis, orthopnea pnd or change in chronic leg swelling. Also denies presyncope, palpitations, heartburn, abdominal pain, nausea, vomiting, diarrhea or change in bowel or urinary habits, dysuria,hematuria, rash, arthralgias, visual complaints, headache, numbness weakness or ataxia.     Objective:   Physical Exam  Gen. Pleasant, obese, in no distress ENT - no lesions, no post nasal drip Neck: No JVD, no thyromegaly, no carotid bruits Lungs: no use of accessory muscles, no dullness to  percussion, decreased without rales or rhonchi  Cardiovascular: Rhythm regular, heart sounds  normal, no murmurs or gallops, no peripheral edema Musculoskeletal: No deformities, no cyanosis or clubbing , no tremors       Assessment & Plan:

## 2023-03-27 NOTE — Assessment & Plan Note (Signed)
Sleep study with him wearing dental appliance shows that severity is decreased but still at mild to moderate levels.  He does have positional component.  He has absolutely not tolerate CPAP and does not want to go back to CPAP therapy.  At the same time he is getting significant benefit from dental appliance and positional therapy and I do not feel that hypoglossal nerve stimulator is necessary here.  We discussed options and we decided to proceed with positional therapy combined with dental appliance.  We discussed ways in which he could prevent sleeping supine and he will try those. He will report back in a few months.  If he has persistent symptoms of somnolence and/or snoring then we can reinvestigate and consider hypoglossal nerve stimulator if severity is increased

## 2023-03-28 DIAGNOSIS — D225 Melanocytic nevi of trunk: Secondary | ICD-10-CM | POA: Diagnosis not present

## 2023-03-28 DIAGNOSIS — L814 Other melanin hyperpigmentation: Secondary | ICD-10-CM | POA: Diagnosis not present

## 2023-03-28 DIAGNOSIS — L821 Other seborrheic keratosis: Secondary | ICD-10-CM | POA: Diagnosis not present

## 2023-03-28 DIAGNOSIS — Z872 Personal history of diseases of the skin and subcutaneous tissue: Secondary | ICD-10-CM | POA: Diagnosis not present

## 2023-04-02 ENCOUNTER — Telehealth (HOSPITAL_BASED_OUTPATIENT_CLINIC_OR_DEPARTMENT_OTHER): Payer: Self-pay | Admitting: Pulmonary Disease

## 2023-04-02 NOTE — Telephone Encounter (Signed)
Patient states he was told by Dr. Vassie Loll to contact insurance company to see if he is covered for an oral appliance. Patient states insurance says he has DME coverage and would like to file a claim on his behalf. Also requesting a call back. Please advise.

## 2023-04-18 ENCOUNTER — Other Ambulatory Visit (INDEPENDENT_AMBULATORY_CARE_PROVIDER_SITE_OTHER): Payer: BC Managed Care – PPO

## 2023-04-18 ENCOUNTER — Ambulatory Visit: Payer: BC Managed Care – PPO | Admitting: Orthopaedic Surgery

## 2023-04-18 ENCOUNTER — Encounter: Payer: Self-pay | Admitting: Orthopaedic Surgery

## 2023-04-18 DIAGNOSIS — G8929 Other chronic pain: Secondary | ICD-10-CM

## 2023-04-18 DIAGNOSIS — M25561 Pain in right knee: Secondary | ICD-10-CM

## 2023-04-18 DIAGNOSIS — M25562 Pain in left knee: Secondary | ICD-10-CM

## 2023-04-18 MED ORDER — LIDOCAINE HCL 1 % IJ SOLN
2.0000 mL | INTRAMUSCULAR | Status: AC | PRN
  Administered 2023-04-18: 2 mL

## 2023-04-18 MED ORDER — BUPIVACAINE HCL 0.5 % IJ SOLN
2.0000 mL | INTRAMUSCULAR | Status: AC | PRN
  Administered 2023-04-18: 2 mL via INTRA_ARTICULAR

## 2023-04-18 MED ORDER — METHYLPREDNISOLONE ACETATE 40 MG/ML IJ SUSP
40.0000 mg | INTRAMUSCULAR | Status: AC | PRN
  Administered 2023-04-18: 40 mg via INTRA_ARTICULAR

## 2023-04-18 NOTE — Progress Notes (Signed)
Office Visit Note   Patient: Edwin [name]           Date of Birth: September 21, 1956           MRN: [id number] Visit Date: 04/18/2023              Requested by: [name], DO [address] [place],  [place] [address] PCP: [name], DO   Assessment & Plan: Visit Diagnoses:  1. Chronic pain of both knees     Plan: Impression is 67 year old gentleman with bilateral knee pain probable osteoarthritis flare possibly also component of patella maltracking.  Treatment options discussed and Voltaren gel has not been effective.  He is already on Plavix therefore cannot take NSAIDs.  I recommend bilateral cortisone injections and referral to physical therapy for strengthening.  Follow-Up Instructions: No follow-ups on file.   Orders:  Orders Placed This Encounter  Procedures   XR KNEE 3 VIEW LEFT   XR KNEE 3 VIEW RIGHT   Ambulatory referral to Physical Therapy   No orders of the defined types were placed in this encounter.     Procedures: Large Joint Inj: bilateral knee on 04/18/2023 9:19 AM Indications: pain Details: 22 G needle  Arthrogram: No  Medications (Right): 2 mL lidocaine 1 %; 2 mL bupivacaine 0.5 %; 40 mg methylPREDNISolone acetate 40 MG/ML Medications (Left): 2 mL lidocaine 1 %; 2 mL bupivacaine 0.5 %; 40 mg methylPREDNISolone acetate 40 MG/ML Outcome: tolerated well, no immediate complications Patient was prepped and draped in the usual sterile fashion.       Clinical Data: No additional findings.   Subjective: Chief Complaint  Patient presents with   Right Knee - Pain   Left Knee - Pain    HPI Edwin [name] is a very pleasant 67 year old gentleman here for evaluation of bilateral knee pain only when he is going from sit to stand.  Denies any swelling grinding or giving way.  He has no pain when walking.  Has used Voltaren gel without significant improvement. Review of Systems  Constitutional: Negative.   HENT: Negative.    Eyes: Negative.    Respiratory: Negative.    Cardiovascular: Negative.   Gastrointestinal: Negative.   Endocrine: Negative.   Genitourinary: Negative.   Skin: Negative.   Allergic/Immunologic: Negative.   Neurological: Negative.   Hematological: Negative.   Psychiatric/Behavioral: Negative.    All other systems reviewed and are negative.    Objective: Vital Signs: There were no vitals taken for this visit.  Physical Exam Vitals and nursing note reviewed.  Constitutional:      Appearance: He is well-developed.  HENT:     Head: Normocephalic and atraumatic.  Eyes:     Pupils: Pupils are equal, round, and reactive to light.  Pulmonary:     Effort: Pulmonary effort is normal.  Abdominal:     Palpations: Abdomen is soft.  Musculoskeletal:        General: Normal range of motion.     Cervical back: Neck supple.  Skin:    General: Skin is warm.  Neurological:     Mental Status: He is alert and oriented to person, place, and time.  Psychiatric:        Behavior: Behavior normal.        Thought Content: Thought content normal.        Judgment: Judgment normal.     Ortho Exam Examination of bilateral knees showed no joint line tenderness or joint effusion.  Collaterals and cruciates are  stable.  Good range of motion.  There is no patellofemoral crepitus. Specialty Comments:  No specialty comments available.  Imaging: No results found.   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/2022   Allergic rhinitis 02/22/2022   Bipolar disorder in remission (HCC) 02/22/2022   Body mass index (BMI) 37.0-37.9, adult 02/22/2022   Bradycardia 02/22/2022   Cat scratch of forearm 02/22/2022    Cerebral infarction (HCC) 02/22/2022   Closed fracture of metatarsal bone 02/22/2022   Constipation 02/22/2022   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   Dyslipidemia 02/22/2022   Edema 02/22/2022   Electrocardiogram abnormal 02/22/2022   Elevated PSA 02/22/2022   Enthesopathy 02/22/2022   Erectile dysfunction 02/22/2022   History of small bowel obstruction 02/22/2022   Hyperglycemia 02/22/2022   Hyperglycemia due to type 2 diabetes mellitus (HCC) 02/22/2022   Insomnia 02/22/2022   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   Impairment of balance 02/22/2022   Major depression, single episode 02/22/2022   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   Melena 02/22/2022   Mixed hyperlipidemia 02/22/2022   Obstructive sleep apnea syndrome 02/22/2022   Other long term (current) drug therapy 02/22/2022   Encounter for immunization 02/22/2022   Right lower quadrant pain 02/22/2022   Severe recurrent major depression without psychotic features (HCC) 02/22/2022   Umbilical hernia 02/22/2022   Vitamin D deficiency 02/22/2022   Left-sided weakness 02/04/2022   Diabetes mellitus type 2 in nonobese (HCC) 02/04/2022   CVA (cerebral vascular accident) (HCC) 02/04/2022   Enteritis of ileum 02/02/2017   Intestinal obstruction (HCC) 02/02/2017   Persistent recurrent vomiting 02/01/2017   Chronic kidney disease, stage 3 unspecified (HCC) 05/19/2016   Abdominal pain 05/19/2016   Hypokalemia 05/19/2016   Primary gout    Bipolar affective disorder, currently depressed, moderate (HCC) 09/20/2014   Hypertension 11/23/2013   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   Past Medical History:  Diagnosis Date   Allergic rhinitis    Bipolar disorder (HCC)    Bradycardia 2012    due to lithium   CKD (chronic kidney disease) stage 2, GFR 60-89 ml/min    Gout    Hypertension    Mild renal insufficiency    , with creatinine of 1.3 in 2010, was side effect of med   Obesity    ,moderate    Prostatitis    , Episodic   Stroke (HCC)    Suicide attempt (HCC) 09/20/2014    Family History  Problem Relation Age of Onset   Other Mother        Viral Meningitis   Mitral valve prolapse Mother    Arrhythmia Mother    Hypothyroidism Mother    Stroke Mother    Hypertension Father    Gout Father    Alzheimer's disease Father     Past Surgical History:  Procedure Laterality Date   APPENDECTOMY     CATARACT EXTRACTION Right    COLONOSCOPY WITH PROPOFOL N/A 06/14/2014   Procedure: COLONOSCOPY WITH PROPOFOL;  Surgeon: Charolett Bumpers, MD;  Location: [facility];  Service: Endoscopy;  Laterality: N/A;   TONSILLECTOMY     UMBILICAL HERNIA REPAIR     Social History   Occupational History   Not on  file  Tobacco Use   Smoking status: Never    Passive exposure: Never   Smokeless tobacco: Never  Substance and Sexual Activity   Alcohol use: No   Drug use: No   Sexual activity: Not on file

## 2023-04-22 ENCOUNTER — Other Ambulatory Visit: Payer: Self-pay | Admitting: Adult Health

## 2023-04-22 ENCOUNTER — Other Ambulatory Visit: Payer: Self-pay | Admitting: Psychiatry

## 2023-04-22 DIAGNOSIS — F3181 Bipolar II disorder: Secondary | ICD-10-CM

## 2023-04-22 DIAGNOSIS — F4310 Post-traumatic stress disorder, unspecified: Secondary | ICD-10-CM

## 2023-05-02 ENCOUNTER — Ambulatory Visit: Payer: Medicare Other | Admitting: Neurology

## 2023-05-02 ENCOUNTER — Encounter: Payer: Self-pay | Admitting: Neurology

## 2023-05-02 VITALS — BP 107/67 | HR 66 | Ht 70.0 in | Wt 235.0 lb

## 2023-05-02 DIAGNOSIS — R202 Paresthesia of skin: Secondary | ICD-10-CM | POA: Diagnosis not present

## 2023-05-02 DIAGNOSIS — Z8673 Personal history of transient ischemic attack (TIA), and cerebral infarction without residual deficits: Secondary | ICD-10-CM | POA: Diagnosis not present

## 2023-05-02 MED ORDER — TOPIRAMATE 25 MG PO TABS: 25.0000 mg | ORAL_TABLET | Freq: Two times a day (BID) | ORAL | 3 refills | Status: DC

## 2023-05-02 NOTE — Patient Instructions (Signed)
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  Followup in the future with Aundra Millet, NP in  3 months or call earlier if necessary.  Mediterranean Diet A Mediterranean diet refers to food and lifestyle choices that are based on the traditions of countries located on the [employer]. It focuses on eating more fruits, vegetables, whole grains, beans, nuts, seeds, and heart-healthy fats, and eating less dairy, meat, eggs, and processed foods with added sugar, salt, and fat. This way of eating has been shown to help prevent certain conditions and improve outcomes for people who have chronic diseases, like kidney disease and heart disease. What are tips for following this plan? Reading food labels Check the serving size of packaged foods. For foods such as rice and pasta, the serving size refers to the amount of cooked product, not dry. Check the total fat in packaged foods. Avoid foods that have saturated fat or trans fats. Check the ingredient list for added sugars, such as corn syrup. Shopping  Buy a variety of foods that offer a balanced diet, including: Fresh fruits and vegetables (produce). Grains, beans, nuts, and seeds. Some of these may be available in unpackaged forms or large amounts (in  bulk). Fresh seafood. Poultry and eggs. Low-fat dairy products. Buy whole ingredients instead of prepackaged foods. Buy fresh fruits and vegetables in-season from local farmers markets. Buy plain frozen fruits and vegetables. If you do not have access to quality fresh seafood, buy precooked frozen shrimp or canned fish, such as tuna, salmon, or sardines. Stock your pantry so you always have certain foods on hand, such as olive oil, canned tuna, canned tomatoes, rice, pasta, and beans. Cooking Cook foods with extra-virgin olive oil instead of using butter or other vegetable oils. Have meat as a side dish, and have vegetables or grains as your main dish. This means having meat in small portions or adding small amounts of meat to foods like pasta or stew. Use beans or vegetables instead of meat in common dishes like chili or lasagna. Experiment with different cooking methods. Try roasting, broiling, steaming, and sauting vegetables. Add frozen vegetables to soups, stews, pasta, or rice. Add nuts or seeds for added healthy fats and plant protein at each meal. You can add these to yogurt, salads, or vegetable dishes. Marinate fish or vegetables using olive oil, lemon juice, garlic, and fresh herbs. Meal planning Plan to eat one vegetarian meal one day each week. Try to work up to two vegetarian meals, if possible. Eat seafood two or more times a week. Have healthy snacks readily available, such as: Vegetable sticks with hummus. Greek yogurt. Fruit and nut trail mix. Eat balanced meals throughout the week. This includes: Fruit: 2-3 servings a day. Vegetables: 4-5 servings a day. Low-fat dairy: 2 servings a day. Fish, poultry, or lean meat: 1 serving a day. Beans and legumes: 2 or more servings a week.  Nuts and seeds: 1-2 servings a day. Whole grains: 6-8 servings a day. Extra-virgin olive oil: 3-4 servings a day. Limit red meat and sweets to only a few servings a month. Lifestyle  Cook  and eat meals together with your family, when possible. Drink enough fluid to keep your urine pale yellow. Be physically active every day. This includes: Aerobic exercise like running or swimming. Leisure activities like gardening, walking, or housework. Get 7-8 hours of sleep each night. If recommended by your health care provider, drink red wine in moderation. This means 1 glass a day for nonpregnant women and 2 glasses a day for men. A glass of wine equals 5 oz (150 mL). What foods should I eat? Fruits Apples. Apricots. Avocado. Berries. Bananas. Cherries. Dates. Figs. Grapes. Lemons. Melon. Oranges. Peaches. Plums. Pomegranate. Vegetables Artichokes. Beets. Broccoli. Cabbage. Carrots. Eggplant. Green beans. Chard. Kale. Spinach. Onions. Leeks. Peas. Squash. Tomatoes. Peppers. Radishes. Grains Whole-grain pasta. Brown rice. Bulgur wheat. Polenta. Couscous. Whole-wheat bread. Orpah Cobb. Meats and other proteins Beans. Almonds. Sunflower seeds. Pine nuts. Peanuts. Cod. Salmon. Scallops. Shrimp. Tuna. Tilapia. Clams. Oysters. Eggs. Poultry without skin. Dairy Low-fat milk. Cheese. Greek yogurt. Fats and oils Extra-virgin olive oil. Avocado oil. Grapeseed oil. Beverages Water. Red wine. Herbal tea. Sweets and desserts Greek yogurt with honey. Baked apples. Poached pears. Trail mix. Seasonings and condiments Basil. Cilantro. Coriander. Cumin. Mint. Parsley. Sage. Rosemary. Tarragon. Garlic. Oregano. Thyme. Pepper. Balsamic vinegar. Tahini. Hummus. Tomato sauce. Olives. Mushrooms. The items listed above may not be a complete list of foods and beverages you can eat. Contact a dietitian for more information. What foods should I limit? This is a list of foods that should be eaten rarely or only on special occasions. Fruits Fruit canned in syrup. Vegetables Deep-fried potatoes (french fries). Grains Prepackaged pasta or rice dishes. Prepackaged cereal with added sugar. Prepackaged  snacks with added sugar. Meats and other proteins Beef. Pork. Lamb. Poultry with skin. Hot dogs. Tomasa Blase. Dairy Ice cream. Sour cream. Whole milk. Fats and oils Butter. Canola oil. Vegetable oil. Beef fat (tallow). Lard. Beverages Juice. Sugar-sweetened soft drinks. Beer. Liquor and spirits. Sweets and desserts Cookies. Cakes. Pies. Candy. Seasonings and condiments Mayonnaise. Pre-made sauces and marinades. The items listed above may not be a complete list of foods and beverages you should limit. Contact a dietitian for more information. Summary The Mediterranean diet includes both food and lifestyle choices. Eat a variety of fresh fruits and vegetables, beans, nuts, seeds, and whole grains. Limit the amount of red meat and sweets that you eat. If recommended by your health care provider, drink red wine in moderation. This means 1 glass a day for nonpregnant women and 2 glasses a day for men. A glass of wine equals 5 oz (150 mL). This information is not intended to replace advice given to you by your health care provider. Make sure you discuss any questions you have with your health care provider. Document Revised: 12/11/2019 Document Reviewed: 10/08/2019 Elsevier Patient Education  2023 [employer].

## 2023-05-02 NOTE — Progress Notes (Signed)
PATIENT: Edwin [name] DOB: [birth year]  REASON FOR VISIT: follow up HISTORY FROM: patient PRIMARY NEUROLOGIST: Dr. Pearlean Brownie  Chief Complaint  Patient presents with   New Patient (Initial Visit)    Patient in room #3 and alone. Patient states he's here today to f/u with his hx of stroke. Patient states he has tingling from his left shoulder and hand down to his calf and feet.    HISTORY OF PRESENT ILLNESS: UPDATE 05/02/23:   He returns for follow-up after last visit with Edwin Other, NP 5 months ago.  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  Please try to increase the gabapentin to 400 mg 3 times daily but made him drowsy during the day of surgery back down to twice daily.  If it does help but he has breakthrough symptoms.  Continues to have mild balance difficulties and leans to the left at times but has not had any falls or injuries.  Remains on Plavix which is tolerating well without significant bleeding or bruising.  His blood pressure and cholesterol under good control.  He does have obstructive sleep apnea and sees Dr. Vassie Loll but he has not been able to tolerate CPAP mask.  He is using the dental device instead and feels it is helping him.  He has not had any recurrent stroke or TIA symptoms.   Prior visit 11/29/2022 Edwin Other, NP )Edwin [name] is a 67 year old male with a history of stroke with residual paresthesias in the left upper extremity.  He returns today for follow-up.  He is currently on gabapentin taking 400 mg twice a day. Tingling down the arm comes and goes. Reports tingling in the back of the left leg as well. Doesn't notice when he is busy but bothersome at [name] times.  Symptoms were not there prior to his stroke but has remained since history.  He returns today for evaluation   06/20/22: Edwin [name] is a 67 year old male with a history of stroke with residual paresthesia.  He returns today for follow-up. Patient Edwin [name] to the  ED on July 15 with left arm and leg numbness.  The patient had residual numbness in the left arm and leg from his prior stroke but felt that it is gotten worse.  Work-up in the ED was relatively unremarkable.  He was started on Topamax for paresthesias but feels that this made it worse.  He returns today to discuss another medication to try. He describes it has pins and needles sensation in the left arm and leg- not as much numbness. He teaches piano.   Reports that he does feel that is balance is off. Tends to veer to the left when ambulating. Feels like he is going to fall but fortunately has not had any falls.  Reports that when he was completing physical therapy he did find it beneficial.  Would like to have another round of physical therapy.  03/22/22: Edwin [name] is a 67 year old male who presented to the ED on March 19 with left-sided sensory deficit and difficulty with ambulation.  He did not receive TNKase due to being out of the window.  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 CT head - 02/04/2022 - No acute intracranial process.  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  Continue Plavix on discharge  Patient reports that he is doing well. Continues to have some tingling on the left side of the body.  Feels it down the arm and leg. Did not complete any therapy. Reports that he had blood work through PCP and reports that LDL is better and is no longer Crestor. States that it caused fatigue and muscle pain.   Reports that he is monitoring diet  and sees a nutritionist  OSA: was using it but then went on a trip and the machine adaptor was broken and was not able to get a replacement CPAP. Was seeing Dr. Earl Gala.  States that he struggled using the CPAP.  Reports that he would wake up every hour.  Reports that he tried different mask with no benefit.  Reports that he is interested in inspire but Dr. Earl Gala does not do this.  He has not seen Dr. Earl Gala in the last year.  He feels that his last sleep study was in 2021 he is requesting to see our sleep physicians here to see if he is a candidate for possible inspire device   REVIEW OF SYSTEMS: Out of a complete 14 system review of symptoms, the patient complains only of the following symptoms, and all [name] reviewed systems are negative.  ALLERGIES: Allergies  Allergen Reactions   Allopurinol [name] (See Comments)    Made pt emotionally unstable  [name] reaction(s): emotionally labile   Penicillins Hives and [name] (See Comments)    Has patient had a PCN reaction causing immediate rash, facial/tongue/throat swelling, SOB or lightheadedness with hypotension: No Has patient had a PCN reaction causing severe rash involving mucus membranes or skin necrosis: No Has patient had a PCN reaction that required hospitalization No Has patient had a PCN reaction occurring within the last 10 years: No If all of the above answers are "NO", then may proceed with Cephalosporin use.   Finerenone [name] (See Comments)    hyperkalemia [name] reaction(s): Increases  potassium levels   Penicillin G     [name] reaction(s): hives   Aspirin Hives   Ciprofloxacin Rash    [name] reaction(s): rash    HOME MEDICATIONS: Outpatient Medications Prior to Visit  Medication Sig Dispense Refill   amLODipine-atorvastatin (CADUET) 5-10 MG tablet Take 1 tablet by mouth daily.     Cholecalciferol 1000 units TBDP Take 1 tablet by mouth daily.      clopidogrel (PLAVIX) 75 MG tablet TAKE ONE TABLET BY MOUTH DAILY 90 tablet 1    dapagliflozin propanediol (FARXIGA) 10 MG TABS tablet Take 10 mg by mouth daily.     diclofenac Sodium (VOLTAREN) 1 % GEL Apply 2 g topically daily as needed (for knee pain).     divalproex (DEPAKOTE ER) 500 MG 24 hr tablet Take 2 tablets (1,000 mg total) by mouth at bedtime. 120 tablet 0   DULoxetine (CYMBALTA) 60 MG capsule Take 2 capsules (120 mg total) by mouth daily. 120 capsule 0   febuxostat (ULORIC) 40 MG tablet Take 40 mg by mouth daily.     furosemide (LASIX) 20 MG tablet Take 20 mg by mouth daily as needed for edema. May take 2 to 3 days weekly     gabapentin (  NEURONTIN) 400 MG capsule Take 400 mg by mouth 2 (two) times daily.     glucosamine-chondroitin 500-400 MG tablet Take 1 tablet by mouth 1 day or 1 dose.     hydrALAZINE (APRESOLINE) 50 MG tablet Take 1 tablet (50 mg total) by mouth 3 (three) times daily.     KERENDIA 10 MG TABS Take 1 tablet by mouth daily.     lamoTRIgine (LAMICTAL) 200 MG tablet TAKE ONE TABLET BY MOUTH IN THE MORNING 30 tablet 2   LevOCARNitine (L-CARNITINE) 500 MG TABS Take 500 mg by mouth 2 (two) times daily.     losartan (COZAAR) 100 MG tablet Take 100 mg by mouth daily.     metFORMIN (GLUCOPHAGE-XR) 500 MG 24 hr tablet Take 500 mg by mouth 2 (two) times daily.     multivitamin-lutein (OCUVITE-LUTEIN) CAPS capsule Take 1 capsule by mouth daily.     pramipexole (MIRAPEX) 0.5 MG tablet TAKE ONE TABLET BY MOUTH TWICE DAILY 60 tablet 2   tamsulosin (FLOMAX) 0.4 MG CAPS capsule Take 0.4 mg by mouth daily.     No facility-administered medications prior to visit.    PAST MEDICAL HISTORY: Past Medical History:  Diagnosis Date   Allergic rhinitis    Bipolar disorder (HCC)    Bradycardia 2012    due to lithium   CKD (chronic kidney disease) stage 2, GFR 60-89 ml/min    Gout    Hypertension    Mild renal insufficiency    , with creatinine of 1.3 in 2010, was side effect of med   Obesity    ,moderate   Prostatitis    , Episodic   Stroke (HCC)     Suicide attempt (HCC) 09/20/2014    PAST SURGICAL HISTORY: Past Surgical History:  Procedure Laterality Date   APPENDECTOMY     CATARACT EXTRACTION Right    COLONOSCOPY WITH PROPOFOL N/A 06/14/2014   Procedure: COLONOSCOPY WITH PROPOFOL;  Surgeon: Charolett Bumpers, MD;  Location: [facility];  Service: Endoscopy;  Laterality: N/A;   TONSILLECTOMY     UMBILICAL HERNIA REPAIR      FAMILY HISTORY: Family History  Problem Relation Age of Onset   [name] Mother        Viral Meningitis   Mitral valve prolapse Mother    Arrhythmia Mother    Hypothyroidism Mother    Stroke Mother    Hypertension Father    Gout Father    Alzheimer's disease Father     SOCIAL HISTORY: Social History   Socioeconomic History   Marital status: Married    Spouse name: Sweet   Number of children: 0   Years of education: Not on file   Highest education level: Master's degree (e.g., MA, MS, MEng, MEd, MSW, MBA)  Occupational History   Not on file  Tobacco Use   Smoking status: Never    Passive exposure: Never   Smokeless tobacco: Never  Substance and Sexual Activity   Alcohol use: No   Drug use: No   Sexual activity: Not on file  [name] Topics Concern   Not on file  Social History Narrative   Lives alone   Left handed   Caffeine: 2 ice tea a day   Social Determinants of Health   Financial Resource Strain: Low Risk  (02/22/2022)   Overall Financial Resource Strain (CARDIA)    Difficulty of Paying Living Expenses: Not hard at all  Food Insecurity: No Food Insecurity (06/19/2022)   Hunger Vital Sign  Worried About Programme researcher, broadcasting/film/video in the Last Year: Never true    Ran Out of Food in the Last Year: Never true  Transportation Needs: No Transportation Needs (06/19/2022)   PRAPARE - Administrator, Civil Service (Medical): No    Lack of Transportation (Non-Medical): No  Physical Activity: Not on file  Stress: No Stress Concern Present (02/22/2022)   [employer] of Occupational  Health - Occupational Stress Questionnaire    Feeling of Stress : Only a little  Social Connections: Not on file  Intimate Partner Violence: Not At Risk (02/22/2022)   Humiliation, Afraid, Rape, and Kick questionnaire    Fear of Current or Ex-Partner: No    Emotionally Abused: No    Physically Abused: No    Sexually Abused: No      PHYSICAL EXAM  Vitals:   05/02/23 1247  BP: 107/67  Pulse: 66  Weight: 235 lb (106.6 kg)  Height: 5\' 10"  (1.778 m)    Body mass index is 33.72 kg/m.  Generalized: Obese middle-age Caucasian male in no acute distress   Neurological examination  Mentation: Alert oriented to time, place, history taking. Follows all commands speech and language fluent Cranial nerve II-XII: Pupils were equal round reactive to light. Extraocular movements were full, visual field were full on confrontational test. Facial sensation and strength were normal.  Head turning and shoulder shrug  were normal and symmetric. Motor: The motor testing reveals 5 over 5 strength of all 4 extremities. Good symmetric motor tone is noted throughout.  Diminished fine finger movements on the left.  Orbits right over left upper extremity. Sensory: Sensory testing is intact to soft touch on all 4 extremities. No evidence of extinction is noted.  Coordination: Cerebellar testing reveals good finger-nose-finger and heel-to-shin bilaterally.  Gait and station: Gait is slow and cautious and slightly broad-based.  Unsteady on either foot unsupported and narrow-based..  Tandem gait not attempted.     DIAGNOSTIC DATA (LABS, IMAGING, TESTING) - I reviewed patient records, labs, notes, testing and imaging myself where available.  Lab Results  Component Value Date   WBC 5.4 07/01/2022   HGB 13.3 07/01/2022   HCT 38.6 (L) 07/01/2022   MCV 93.2 07/01/2022   PLT 174 07/01/2022      Component Value Date/Time   NA 140 07/01/2022 0352   K 4.1 07/01/2022 0352   CL 108 07/01/2022 0352   CO2 25  07/01/2022 0352   GLUCOSE 125 (H) 07/01/2022 0352   BUN 31 (H) 07/01/2022 0352   CREATININE 1.73 (H) 07/01/2022 0352   CALCIUM 9.3 07/01/2022 0352   PROT 6.1 (L) 07/01/2022 0352   ALBUMIN 3.7 07/01/2022 0352   AST 22 07/01/2022 0352   ALT 19 07/01/2022 0352   ALKPHOS 61 07/01/2022 0352   BILITOT 0.7 07/01/2022 0352   GFRNONAA 44 (L) 07/01/2022 0352   GFRAA >60 02/04/2017 0525   Lab Results  Component Value Date   CHOL 160 07/01/2022   HDL 46 07/01/2022   LDLCALC 105 (H) 07/01/2022   TRIG 45 07/01/2022   CHOLHDL 3.5 07/01/2022   Lab Results  Component Value Date   HGBA1C 4.9 07/01/2022       ASSESSMENT AND PLAN 67 y.o. year old male  has a past medical history of Allergic rhinitis, Bipolar disorder (HCC), Bradycardia (2012), CKD (chronic kidney disease) stage 2, GFR 60-89 ml/min, Gout, Hypertension, Mild renal insufficiency, Obesity, Prostatitis, Stroke (HCC), and Suicide attempt (HCC) (09/20/2014).  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  Vascular risk factors of obesity, hypertension, diabetes, hyperlipidemia and a obstructive sleep apnea.  Doing well except for persistent poststroke paresthesias.  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  Followup  in the future with Aundra Millet, NP in  3 months or call earlier if necessary. Greater than 50% time during this 35-minute visit was spent in counseling and coordination of care about his medullary infarct and postoperative seizures and discussion about management and questions   Delia Heady, MD 05/02/2023, 1:28 PM [facility] [address], [address] [place], [place] [address] [phone]

## 2023-05-06 ENCOUNTER — Encounter: Payer: Self-pay | Admitting: Adult Health

## 2023-05-06 DIAGNOSIS — J029 Acute pharyngitis, unspecified: Secondary | ICD-10-CM | POA: Diagnosis not present

## 2023-05-06 DIAGNOSIS — R221 Localized swelling, mass and lump, neck: Secondary | ICD-10-CM | POA: Diagnosis not present

## 2023-05-06 DIAGNOSIS — R202 Paresthesia of skin: Secondary | ICD-10-CM

## 2023-05-06 NOTE — Telephone Encounter (Addendum)
Spoke to patient per Megan,NP stop topiramate and go to ED or urgent care to get evaulated due to medication reaction pt wanted to know If there are other medications he can try for paresthesia informed pt will send message to Dr Pearlean Brownie . Pt states will go to Urgent care now . Will forward message to Dr Pearlean Brownie  Pt denies SOB or problems swallowing

## 2023-05-07 ENCOUNTER — Other Ambulatory Visit: Payer: Self-pay | Admitting: Neurology

## 2023-05-07 MED ORDER — PREGABALIN 50 MG PO CAPS: 50.0000 mg | ORAL_CAPSULE | Freq: Two times a day (BID) | ORAL | 1 refills | Status: DC

## 2023-05-07 NOTE — Progress Notes (Signed)
Patient is complaining of disabling paresthesias and he is not able to tolerate gabapentin dose beyond 400 twice daily.  He was not able to tolerate Topamax hence we will try adding low-dose Lyrica 50 mg twice daily

## 2023-05-08 ENCOUNTER — Encounter: Payer: Self-pay | Admitting: Physical Therapy

## 2023-05-08 ENCOUNTER — Other Ambulatory Visit: Payer: Self-pay

## 2023-05-08 ENCOUNTER — Ambulatory Visit (INDEPENDENT_AMBULATORY_CARE_PROVIDER_SITE_OTHER): Payer: Medicare Other | Admitting: Physical Therapy

## 2023-05-08 DIAGNOSIS — M6281 Muscle weakness (generalized): Secondary | ICD-10-CM

## 2023-05-08 DIAGNOSIS — M25561 Pain in right knee: Secondary | ICD-10-CM | POA: Diagnosis not present

## 2023-05-08 DIAGNOSIS — R262 Difficulty in walking, not elsewhere classified: Secondary | ICD-10-CM | POA: Diagnosis not present

## 2023-05-08 DIAGNOSIS — G8929 Other chronic pain: Secondary | ICD-10-CM

## 2023-05-08 DIAGNOSIS — M25562 Pain in left knee: Secondary | ICD-10-CM

## 2023-05-08 MED ORDER — PREGABALIN 50 MG PO CAPS: 50.0000 mg | ORAL_CAPSULE | Freq: Two times a day (BID) | ORAL | 0 refills | Status: DC

## 2023-05-08 NOTE — Addendum Note (Signed)
Addended by: Raynald Kemp A on: 05/08/2023 07:51 AM   Modules accepted: Orders

## 2023-05-08 NOTE — Therapy (Addendum)
OUTPATIENT PHYSICAL THERAPY LOWER EXTREMITY EVALUATION  / DISCHARGE   Patient Name: Edwin [name] MRN: [id number] DOB:[birth year], 67 y.o., male Today's Date: 05/08/2023  END OF SESSION:  PT End of Session - 05/08/23 0944     Visit Number 1    Number of Visits 12    Date for PT Re-Evaluation 06/19/23    Authorization Type [employer] MCR    Progress Note Due on Visit 10    PT Start Time 0845    PT Stop Time 0928    PT Time Calculation (min) 43 min    Activity Tolerance Patient tolerated treatment well    Behavior During Therapy [facility] for tasks assessed/performed             Past Medical History:  Diagnosis Date   Allergic rhinitis    Bipolar disorder (HCC)    Bradycardia 2012    due to lithium   CKD (chronic kidney disease) stage 2, GFR 60-89 ml/min    Gout    Hypertension    Mild renal insufficiency    , with creatinine of 1.3 in 2010, was side effect of med   Obesity    ,moderate   Prostatitis    , Episodic   Stroke (HCC)    Suicide attempt (HCC) 09/20/2014   Past Surgical History:  Procedure Laterality Date   APPENDECTOMY     CATARACT EXTRACTION Right    COLONOSCOPY WITH PROPOFOL N/A 06/14/2014   Procedure: COLONOSCOPY WITH PROPOFOL;  Surgeon: Charolett Bumpers, MD;  Location: [facility];  Service: Endoscopy;  Laterality: N/A;   TONSILLECTOMY     UMBILICAL HERNIA REPAIR     Patient Active Problem List   Diagnosis Date Noted   Nocturnal hypoxia 08/02/2022   Intolerance of continuous positive airway pressure (CPAP) ventilation 08/02/2022   Paresthesias 08/02/2022   Night-waking disorder with delayed sleep phase 08/02/2022   Pain in right ankle and joints of right foot 02/22/2022   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HCC) 02/22/2022   Allergic rhinitis 02/22/2022   Bipolar disorder in remission (HCC) 02/22/2022   Body mass index (BMI) 37.0-37.9, adult 02/22/2022   Bradycardia 02/22/2022   Cat scratch of forearm 02/22/2022   Cerebral infarction (HCC) 02/22/2022   Closed  fracture of metatarsal bone 02/22/2022   Constipation 02/22/2022   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   Dyslipidemia 02/22/2022   Edema 02/22/2022   Electrocardiogram abnormal 02/22/2022   Elevated PSA 02/22/2022   Enthesopathy 02/22/2022   Erectile dysfunction 02/22/2022   History of small bowel obstruction 02/22/2022   Hyperglycemia 02/22/2022   Hyperglycemia due to type 2 diabetes mellitus (HCC) 02/22/2022   Insomnia 02/22/2022   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   Impairment of balance 02/22/2022   Major depression, single episode 02/22/2022   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   Melena 02/22/2022   Mixed hyperlipidemia 02/22/2022   Obstructive sleep apnea syndrome 02/22/2022   Other long term (current) drug therapy 02/22/2022   Encounter for immunization 02/22/2022   Right lower quadrant pain 02/22/2022   Severe recurrent major depression without psychotic features (HCC) 02/22/2022   Umbilical hernia 02/22/2022   Vitamin D deficiency 02/22/2022   Left-sided weakness 02/04/2022   Diabetes mellitus type 2 in nonobese (HCC) 02/04/2022   CVA (cerebral vascular accident) (HCC) 02/04/2022   Enteritis of ileum 02/02/2017   Intestinal obstruction (HCC) 02/02/2017   Persistent recurrent vomiting 02/01/2017   Chronic kidney disease, stage 3 unspecified (HCC) 05/19/2016   Abdominal pain 05/19/2016   Hypokalemia  05/19/2016   Primary gout    Bipolar affective disorder, currently depressed, moderate (HCC) 09/20/2014   Hypertension 11/23/2013   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 He had an injection which helped a lot and was told he should come to PT for quad strengthening. He also relays a fall one month ago in the parking lot tripped over a curb and feels he pulled a muscle in his left lateral leg. He does overall relay some balance difficulty, left UE paraesthesia, and left leg weakness since his stroke 01/31/22.   PERTINENT HISTORY: PMH: CVA,sleep disorder,DM2, HTN, hypogonadism, bipolar, gout, CKD, and suicide attempt (2015). PAIN:  Are you having pain? Yes: NPRS scale: 7 with activity, 0 at rest/10 Pain location: anterior Rt knee, anterior and lateral left knee Pain description: sharp Aggravating factors: sit to stand from low chair or car, stairs  Relieving factors:    PRECAUTIONS: None  WEIGHT BEARING RESTRICTIONS: No  FALLS:  Has patient fallen in last 6 months? Yes. Number of falls 1  LIVING ENVIRONMENT: Stairs: No  OCCUPATION: Engineer, agricultural  PLOF: Independent  PATIENT GOALS: reduce pain, get stronger, improve balance  NEXT MD VISIT:   OBJECTIVE:   DIAGNOSTIC FINDINGS: "Knee X-rays demonstrate no acute abnormalities.  There is a superior patellar  traction spur on left knee. "  PATIENT SURVEYS:  Eval: FOTO 53% functional intake, goal is 66%  COGNITION: Overall cognitive status: Within functional limits for tasks assessed      MUSCLE LENGTH:  Eval Hamstrings: very tight bilat Thomas test: very tight bilat   LOWER EXTREMITY ROM:  Active ROM Right eval Left eval  Hip flexion    Hip extension    Hip abduction    Hip adduction    Hip internal rotation    Hip external rotation    Knee flexion 125 125  Knee extension 3 3  Ankle dorsiflexion    Ankle plantarflexion    Ankle inversion    Ankle eversion     (Blank rows = not  tested)  LOWER EXTREMITY MMT:  MMT Right eval Left eval  Hip flexion 5 4  Hip extension    Hip abduction 5 4  Hip adduction 5 4  Hip internal rotation    Hip external rotation    Knee flexion 5 4+  Knee extension 4+ 36.5# avg 4 22#  Ankle dorsiflexion    Ankle plantarflexion    Ankle inversion    Ankle eversion     (Blank rows = not tested)  LOWER EXTREMITY SPECIAL TESTS:  + theatre sign for patella femoral pain syndrome bilat  FUNCTIONAL TESTS:  Eval: 5 times sit to stand: 17 seconds with UE support  Tandem balance: 8 sec with Rt leg back, 2.5 seconds with left leg back  GAIT: Eval Comments: independent with gait but can be unsteady at times most often to his left.   TODAY'S TREATMENT:  Eval HEP creation and  review with demonstration and trial set preformed, see below for details    PATIENT EDUCATION: Education details: HEP, PT plan of care Person educated: Patient Education method: Explanation, Demonstration, Verbal cues, and Handouts Education comprehension: verbalized understanding and needs further education   HOME EXERCISE PROGRAM: Access Code: [email or web address] URL: [email or web address] Date: 05/08/2023 Prepared by: Ivery Quale  Exercises - Supine Quadriceps Stretch with Strap on Table  - 2 x daily - 6 x weekly - 3 reps - 30 hold - Mini Squat with Counter Support  - 2 x daily - 6 x weekly - 1-2 sets - 10 reps - Seated Straight Leg Raise with Quad Contraction  - 2 x daily - 6 x weekly - 2 sets - 10 reps - Seated Knee Extension with Resistance  - 2 x daily - 6 x weekly - 2 sets - 10-15 reps - Standing Tandem Balance with Counter Support  - 2 x daily - 6 x weekly - 1 sets - 3 reps - 30 hold - Romberg Stance with Head Nods  - 2 x daily - 6 x weekly - 1 sets - 10 reps - Romberg Stance with Head Rotation  - 2 x daily - 6 x weekly - 1 sets - 10 reps  ASSESSMENT:  CLINICAL IMPRESSION: Patient referred to PT for Chronic Bilateral knee pain with  Patellofemoral syndrome and OA flare up. He also has some bilat quad weakness and overall residual left leg weakness with balance deficits secondary to CVA.  Patient will benefit from skilled PT to address below impairments, limitations and improve overall function.  OBJECTIVE IMPAIRMENTS: decreased activity tolerance, difficulty walking, decreased balance, decreased endurance, decreased mobility, decreased ROM, decreased strength, impaired flexibility, impaired UE/LE use, postural dysfunction, and pain.  ACTIVITY LIMITATIONS: bending, lifting, carry, locomotion, cleaning, community activity, driving, and or occupation  PERSONAL FACTORS: PMH: CVA,sleep disorder,DM2, HTN, hypogonadism, bipolar, gout, CKD, and suicide attempt (2015). are also affecting patient's functional outcome.  REHAB POTENTIAL: Good  CLINICAL DECISION MAKING: Stable/uncomplicated  EVALUATION COMPLEXITY: Low    GOALS: Short term PT Goals Target date: 06/05/2023   Pt will be I and compliant with HEP. Baseline:  Goal status: New Pt will decrease pain by 25% overall with sit to stand Baseline: Goal status: New  Long term PT goals Target date:06/19/2023   Pt will improve ROM to [facility] to improve functional mobility Baseline: Goal status: New Pt will improve bilat hip/knee strength to at least 4+/5 MMT to improve functional strength Baseline: Goal status: New Pt will improve FOTO to at least 66% functional to show improved function Baseline: Goal status: New Pt will reduce pain by overall 50% overall with usual activity and sit to stand Baseline: Goal status: New Pt will improve 5 times sit to stand test to less than 13 seconds Baseline:17 Goal status: New Pt will improve tandem balance to >15 seconds Baseline: Goal status: New  PLAN: PT FREQUENCY: 1-3 times per week   PT DURATION: 6-8 weeks  PLANNED INTERVENTIONS (unless contraindicated): aquatic PT, Canalith repositioning, cryotherapy, Electrical  stimulation, Iontophoresis with 4 mg/ml dexamethasome, Moist heat, traction, Ultrasound, gait training, Therapeutic exercise, balance training, neuromuscular re-education, patient/family education, prosthetic training, manual techniques, passive ROM, dry needling, taping, vasopnuematic device, vestibular, spinal manipulations, joint manipulations  PLAN FOR NEXT SESSION: review HEP, begin gym activity for quad strength, incorporate balance training.    April Manson, PT,DPT 05/08/2023, 9:45 AM    PHYSICAL THERAPY DISCHARGE SUMMARY  Visits from Start of Care: 1  Current  functional level related to goals / functional outcomes: See note   Remaining deficits: See note   Education / Equipment: HEP  Patient goals were not met. Patient is being discharged due to not returning since the last visit.  Chyrel Masson, PT, DPT, OCS, ATC 07/11/23  9:09 AM

## 2023-05-09 ENCOUNTER — Encounter: Payer: Self-pay | Admitting: Adult Health

## 2023-05-09 DIAGNOSIS — R202 Paresthesia of skin: Secondary | ICD-10-CM

## 2023-05-10 NOTE — Telephone Encounter (Signed)
Spoke to patient informed him if symptoms get worse please go to urgent care to be evaluated     Pt expressed understanding and thanked me for calling

## 2023-05-13 NOTE — Telephone Encounter (Signed)
Spoke to patient agreed to dosage change . Forward to DR. Sethi for approval

## 2023-05-13 NOTE — Addendum Note (Signed)
Addended by: Raynald Kemp A on: 05/13/2023 11:25 AM   Modules accepted: Orders

## 2023-05-14 ENCOUNTER — Encounter: Payer: Medicare Other | Admitting: Physical Therapy

## 2023-05-14 MED ORDER — PREGABALIN 25 MG PO CAPS: 25.0000 mg | ORAL_CAPSULE | Freq: Two times a day (BID) | ORAL | 2 refills | Status: DC

## 2023-05-16 NOTE — Telephone Encounter (Signed)
I think Dr. Pearlean Brownie started this.   Seems like it would be bilateral swelling but if it stared right after lyrica could be due to that. I would recommend that he hold the lyrica for now and see PCP to ensure there is no other cause.

## 2023-05-17 ENCOUNTER — Other Ambulatory Visit (HOSPITAL_COMMUNITY): Payer: Self-pay | Admitting: Family Medicine

## 2023-05-17 ENCOUNTER — Ambulatory Visit (HOSPITAL_COMMUNITY)
Admission: RE | Admit: 2023-05-17 | Discharge: 2023-05-17 | Disposition: A | Discharge status: Home / Self Care | Payer: Medicare Other | Source: Ambulatory Visit | Attending: Internal Medicine | Admitting: Internal Medicine

## 2023-05-17 DIAGNOSIS — I10 Essential (primary) hypertension: Secondary | ICD-10-CM | POA: Diagnosis not present

## 2023-05-17 DIAGNOSIS — E119 Type 2 diabetes mellitus without complications: Secondary | ICD-10-CM | POA: Diagnosis not present

## 2023-05-17 DIAGNOSIS — M7989 Other specified soft tissue disorders: Secondary | ICD-10-CM | POA: Diagnosis not present

## 2023-05-17 DIAGNOSIS — M79605 Pain in left leg: Secondary | ICD-10-CM | POA: Diagnosis not present

## 2023-05-17 DIAGNOSIS — R600 Localized edema: Secondary | ICD-10-CM | POA: Diagnosis not present

## 2023-05-17 NOTE — Progress Notes (Signed)
Left lower ext venous  has been completed. Refer to [facility] under chart review to view preliminary results. Attempted twice to call provider's office with results, no answer.   05/17/2023  3:17 PM Danyia Borunda, Gerarda Gunther

## 2023-05-18 ENCOUNTER — Other Ambulatory Visit: Payer: Self-pay | Admitting: Psychiatry

## 2023-05-18 DIAGNOSIS — L03116 Cellulitis of left lower limb: Secondary | ICD-10-CM | POA: Diagnosis not present

## 2023-05-18 DIAGNOSIS — F3181 Bipolar II disorder: Secondary | ICD-10-CM

## 2023-05-21 ENCOUNTER — Encounter: Payer: Medicare Other | Admitting: Physical Therapy

## 2023-05-21 DIAGNOSIS — L03116 Cellulitis of left lower limb: Secondary | ICD-10-CM | POA: Diagnosis not present

## 2023-05-22 ENCOUNTER — Other Ambulatory Visit: Payer: Self-pay

## 2023-05-22 ENCOUNTER — Encounter (HOSPITAL_BASED_OUTPATIENT_CLINIC_OR_DEPARTMENT_OTHER): Payer: Self-pay

## 2023-05-22 DIAGNOSIS — Z79899 Other long term (current) drug therapy: Secondary | ICD-10-CM | POA: Insufficient documentation

## 2023-05-22 DIAGNOSIS — Z7984 Long term (current) use of oral hypoglycemic drugs: Secondary | ICD-10-CM | POA: Insufficient documentation

## 2023-05-22 DIAGNOSIS — I10 Essential (primary) hypertension: Secondary | ICD-10-CM | POA: Insufficient documentation

## 2023-05-22 DIAGNOSIS — M7989 Other specified soft tissue disorders: Secondary | ICD-10-CM | POA: Diagnosis not present

## 2023-05-22 DIAGNOSIS — L03116 Cellulitis of left lower limb: Secondary | ICD-10-CM | POA: Insufficient documentation

## 2023-05-22 DIAGNOSIS — E119 Type 2 diabetes mellitus without complications: Secondary | ICD-10-CM | POA: Insufficient documentation

## 2023-05-22 LAB — CBC
HCT: 35.2 % — ABNORMAL LOW (ref 39.0–52.0)
Hemoglobin: 12 g/dL — ABNORMAL LOW (ref 13.0–17.0)
MCH: 31.7 pg (ref 26.0–34.0)
MCHC: 34.1 g/dL (ref 30.0–36.0)
MCV: 92.9 fL (ref 80.0–100.0)
Platelets: 202 10*3/uL (ref 150–400)
RBC: 3.79 MIL/uL — ABNORMAL LOW (ref 4.22–5.81)
RDW: 13 % (ref 11.5–15.5)
WBC: 5.7 10*3/uL (ref 4.0–10.5)
nRBC: 0 % (ref 0.0–0.2)

## 2023-05-22 LAB — BASIC METABOLIC PANEL
Anion gap: 11 (ref 5–15)
BUN: 58 mg/dL — ABNORMAL HIGH (ref 8–23)
CO2: 22 mmol/L (ref 22–32)
Calcium: 9.9 mg/dL (ref 8.9–10.3)
Chloride: 104 mmol/L (ref 98–111)
Creatinine, Ser: 2.14 mg/dL — ABNORMAL HIGH (ref 0.61–1.24)
GFR, Estimated: 34 mL/min — ABNORMAL LOW (ref >60)
Glucose, Bld: 118 mg/dL — ABNORMAL HIGH (ref 70–99)
Potassium: 4.2 mmol/L (ref 3.5–5.1)
Sodium: 137 mmol/L (ref 135–145)

## 2023-05-22 NOTE — ED Triage Notes (Signed)
Patient here POV from Home.  Endorses Left Leg Redness/Swelling. Negative US DVT Study 5-6 Days ago. Was taking Doxycycline and is now taking Keflex but seeks Evaluation for continued Symptoms. No Fevers.   NAD Noted during Triage. A&Ox4. GCS 15. Ambulatory.

## 2023-05-23 ENCOUNTER — Emergency Department (HOSPITAL_BASED_OUTPATIENT_CLINIC_OR_DEPARTMENT_OTHER): Payer: Medicare Other

## 2023-05-23 ENCOUNTER — Emergency Department (HOSPITAL_BASED_OUTPATIENT_CLINIC_OR_DEPARTMENT_OTHER)
Admission: EM | Admit: 2023-05-23 | Discharge: 2023-05-23 | Disposition: A | Discharge status: Home / Self Care | Payer: Medicare Other | Attending: Emergency Medicine | Admitting: Emergency Medicine

## 2023-05-23 DIAGNOSIS — M7989 Other specified soft tissue disorders: Secondary | ICD-10-CM | POA: Diagnosis not present

## 2023-05-23 DIAGNOSIS — M79605 Pain in left leg: Secondary | ICD-10-CM | POA: Diagnosis not present

## 2023-05-23 DIAGNOSIS — L03116 Cellulitis of left lower limb: Secondary | ICD-10-CM

## 2023-05-23 DIAGNOSIS — R600 Localized edema: Secondary | ICD-10-CM | POA: Diagnosis not present

## 2023-05-23 MED ORDER — CLINDAMYCIN HCL 300 MG PO CAPS: 300.0000 mg | ORAL_CAPSULE | Freq: Four times a day (QID) | ORAL | 0 refills | Status: DC

## 2023-05-23 MED ORDER — CLINDAMYCIN PHOSPHATE 600 MG/50ML IV SOLN
600.0000 mg | Freq: Once | INTRAVENOUS | Status: AC
  Administered 2023-05-23: 600 mg via INTRAVENOUS
  Filled 2023-05-23: qty 50

## 2023-05-23 NOTE — ED Provider Notes (Signed)
[place] EMERGENCY DEPARTMENT AT [facility] Provider Note   CSN: [id number] Arrival date & time: 05/22/23  2236     History  Chief Complaint  Patient presents with   Leg Swelling    Edwin [name] is a 67 y.o. male.  Patient is a 67 year old male with history of hypertension, type 2 diabetes, prior CVA, bipolar.  Patient presenting today with complaints of pain, redness and swelling to the left lower extremity.  He was diagnosed with cellulitis and over the past 10 days has been treated with Keflex followed by doxycycline.  He continues to have redness and swelling to the leg.  He had an ultrasound performed 10 days ago but this study was negative.  He denies fevers or chills.  He denies chest pain or difficulty breathing.  The history is provided by the patient.       Home Medications Prior to Admission medications   Medication Sig Start Date End Date Taking? Authorizing Provider  amLODipine-atorvastatin (CADUET) 5-10 MG tablet Take 1 tablet by mouth daily.    [provider]  Cholecalciferol 1000 units TBDP Take 1 tablet by mouth daily.     [provider]  clopidogrel (PLAVIX) 75 MG tablet TAKE ONE TABLET BY MOUTH DAILY 04/23/23   Butch Penny, NP  dapagliflozin propanediol (FARXIGA) 10 MG TABS tablet Take 10 mg by mouth daily.    [provider]  diclofenac Sodium (VOLTAREN) 1 % GEL Apply 2 g topically daily as needed (for knee pain).    [provider]  divalproex (DEPAKOTE ER) 500 MG 24 hr tablet Take 2 tablets (1,000 mg total) by mouth at bedtime. 04/22/23   Cottle, Steva Ready., MD  DULoxetine (CYMBALTA) 60 MG capsule Take 2 capsules (120 mg total) by mouth daily. 04/22/23   Cottle, Steva Ready., MD  febuxostat (ULORIC) 40 MG tablet Take 40 mg by mouth daily.    [provider]  furosemide (LASIX) 20 MG tablet Take 20 mg by mouth daily as needed for edema. May take 2 to 3 days weekly 07/21/19   [provider]   gabapentin (NEURONTIN) 400 MG capsule Take 400 mg by mouth 2 (two) times daily.    [provider]  glucosamine-chondroitin 500-400 MG tablet Take 1 tablet by mouth 1 day or 1 dose.    [provider]  hydrALAZINE (APRESOLINE) 50 MG tablet Take 1 tablet (50 mg total) by mouth 3 (three) times daily. 02/06/22   Ghimire, Werner Lean, MD  KERENDIA 10 MG TABS Take 1 tablet by mouth daily. 03/18/23   [provider]  lamoTRIgine (LAMICTAL) 200 MG tablet TAKE ONE TABLET BY MOUTH IN THE MORNING 05/19/23   Cottle, Steva Ready., MD  LevOCARNitine (L-CARNITINE) 500 MG TABS Take 500 mg by mouth 2 (two) times daily.    [provider]  losartan (COZAAR) 100 MG tablet Take 100 mg by mouth daily. 06/29/22   [provider]  metFORMIN (GLUCOPHAGE-XR) 500 MG 24 hr tablet Take 500 mg by mouth 2 (two) times daily. 01/07/22   [provider]  multivitamin-lutein (OCUVITE-LUTEIN) CAPS capsule Take 1 capsule by mouth daily.    [provider]  pramipexole (MIRAPEX) 0.5 MG tablet TAKE ONE TABLET BY MOUTH TWICE DAILY 05/19/23   Cottle, Steva Ready., MD  pregabalin (LYRICA) 25 MG capsule Take 1 capsule (25 mg total) by mouth 2 (two) times daily. 05/14/23   Micki Riley, MD  tamsulosin (FLOMAX) 0.4 MG  CAPS capsule Take 0.4 mg by mouth daily. 06/04/22   [provider]      Allergies    Allopurinol, Penicillins, Finerenone, Penicillin g, Aspirin, and Ciprofloxacin    Review of Systems   Review of Systems  All other systems reviewed and are negative.   Physical Exam Updated Vital Signs BP (!) 145/76   Pulse (!) 55   Temp 98.4 F (36.9 C) (Oral)   Resp 17   Ht 5\' 10"  (1.778 m)   Wt 104.3 kg   SpO2 94%   BMI 33.00 kg/m  Physical Exam Vitals and nursing note reviewed.  Constitutional:      Appearance: Normal appearance.  Pulmonary:     Effort: Pulmonary effort is normal.  Musculoskeletal:     Comments: The left lower extremity is noted to be  edematous, erythematous, and warm to the touch.  DP pulses are palpable.  Skin:    General: Skin is warm and dry.  Neurological:     Mental Status: He is alert.     ED Results / Procedures / Treatments   Labs (all labs ordered are listed, but only abnormal results are displayed) Labs Reviewed  BASIC METABOLIC PANEL - Abnormal; Notable for the following components:      Result Value   Glucose, Bld 118 (*)    BUN 58 (*)    Creatinine, Ser 2.14 (*)    GFR, Estimated 34 (*)    All other components within normal limits  CBC - Abnormal; Notable for the following components:   RBC 3.79 (*)    Hemoglobin 12.0 (*)    HCT 35.2 (*)    All other components within normal limits    EKG None  Radiology No results found.  Procedures Procedures    Medications Ordered in ED Medications - No data to display  ED Course/ Medical Decision Making/ A&P  Patient is a 67 year old male presenting with complaints of left leg pain and swelling.  He has been diagnosed with cellulitis and has had a negative ultrasound recently.  Patient arrives here afebrile with stable vital signs.  He is clinically well-appearing.  There is significant swelling and erythema and warmth of the left lower leg.  Laboratory studies obtained including CBC and basic metabolic panel, both of which are unremarkable.  Ultrasound of the leg shows no evidence for DVT.  Patient given IV clindamycin and will be discharged with clindamycin.  I did discuss admission as he had failed Keflex and doxycycline, but patient tells me he has somewhere to be Saturday morning and absolutely, positively cannot stay in the hospital.  He will be discharged with outpatient follow-up and clindamycin.  Final Clinical Impression(s) / ED Diagnoses Final diagnoses:  None    Rx / DC Orders ED Discharge Orders     None         Geoffery Lyons, MD 05/23/23 [phone]

## 2023-05-23 NOTE — Discharge Instructions (Signed)
Begin taking clindamycin as prescribed.  Follow-up with primary doctor if not improving in the next few days, and return to the ER if symptoms significantly worsen or change.

## 2023-05-27 ENCOUNTER — Ambulatory Visit (INDEPENDENT_AMBULATORY_CARE_PROVIDER_SITE_OTHER): Payer: Medicare Other | Admitting: Psychiatry

## 2023-05-27 ENCOUNTER — Encounter: Payer: Self-pay | Admitting: Psychiatry

## 2023-05-27 DIAGNOSIS — F4310 Post-traumatic stress disorder, unspecified: Secondary | ICD-10-CM

## 2023-05-27 DIAGNOSIS — F3181 Bipolar II disorder: Secondary | ICD-10-CM

## 2023-05-27 DIAGNOSIS — G4733 Obstructive sleep apnea (adult) (pediatric): Secondary | ICD-10-CM

## 2023-05-27 MED ORDER — DIVALPROEX SODIUM ER 500 MG PO TB24
1000.0000 mg | ORAL_TABLET | Freq: Every day | ORAL | 5 refills | Status: DC
Start: 2023-05-27 — End: 2023-09-30

## 2023-05-27 MED ORDER — DULOXETINE HCL 60 MG PO CPEP: 120.0000 mg | ORAL_CAPSULE | Freq: Every day | ORAL | 5 refills | Status: DC

## 2023-05-27 MED ORDER — PRAMIPEXOLE DIHYDROCHLORIDE 0.5 MG PO TABS: 0.5000 mg | ORAL_TABLET | Freq: Two times a day (BID) | ORAL | 5 refills | Status: DC

## 2023-05-27 NOTE — Progress Notes (Signed)
Edwin [name] [id number] 11/05/1956 67 y.o.  Subjective:   Patient ID:  Edwin [name] is a 67 y.o. (DOB [birth year]) male.  Chief Complaint:  Chief Complaint  Patient presents with   Follow-up    Mood and anxiety and sleep    Depression        Associated symptoms include no decreased concentration and no suicidal ideas.  Edwin [name] presents to the office today for follow-up of bipolar depression and anxiety.  seen July 02, 2019.  No meds were changed.  Patient was doing well.  He asked me to write a letter to his attorney and support his intention to marry a woman from overseas and that his mental health was sufficiently well that he would not represent a danger to his fiance.  This was discussed with his attorney and the letter was written.  Met girl June 8. Met her on dating site international.  [place] woman and decided wanted to get married.  Options opening up after being shut down by Covid.  seen November 2020.  No meds were changed.  He was overall doing well.  seen January 20, 2020 .  No med changed.  Following noted. Wonders about elevated liver enzymes and Depakote.  Labs not visible in Epic.  PCP Dr. Zenovia Jordan is working up this finding.   Doing great from mental health.  Talks daily to GF in [place].    03/23/20 the following is noted:    Taken on too many students.  Recognizes needs to have 2 days off a week and plans to schedule that.  Needs to get through end of the semester.  Fiance visa is difficult but she just got admitted to [facility] pending some exams being passed.  Visa difficult with Covid affecting travel.  She could be here in mid-July. No med changes  07/06/20 with the following noted: Still good. Pramipexole has kept depression at bay. Mood is still stable. Good interest in basketball. Still concerned about weight and blames meds and wonders about weight loss meds and supplements.  Asked about L-glutamine taken it for a week.  Trying to watch  diet.  Hasn't weighed himself. Plan: No med changes indicated yet.  Sig risk in switching his primary mood stabilizer unless its absolutely nececessary.  10/05/20 appt noted: Consistent with meds.  Still doing great overall.  Some trouble sleeping and got help with CPAP with Dr. Earl Gala.  Tendency to sleep 3 hours and awakened.  May stay awake 1-2 hours and eventually get sufficient sleep.  Can awaken refreshed. No SE except weight gain.   Got denied for Ozempic.  Very concerned about weight gain with Depakote. Still facetiming with GF in [place] and no visa for her yet bc country still shut down. Plan no med changes  01/05/2021 appointment with the following noted: Expressed concerns about weight and new dx DM.   Disc retrying efforts to get Ozempic.   Has increase glucose.   Been on phone with GF in [place] for 20 mos.  It's dragging on trying to get her here.  Thinking about going there.   Previously lost the love of his life DT his illness.  Sense of loss bc her birthday was a couple of days ago.    05/03/2021 appointment with the following noted: Doing as well as anyone can be doing.  Flew to [place] in April.  People were nice.  Enjoyed the family and went with 27 people to the beach.  GF comes  from family of 9.  Going back in July and wedding July 30.  Had Face Timed for 22 mos and gotten to know the family.  She has not been to [place] yet but they plan to live here.  Spousal visas are back logged.   Has seen PCP and dietician and is losing weight. Plan: No med changes indicated yet.  Sig risk in switchin g his primary mood stabilizer unless its absolutely nececessary. Failed attempt to reduce pramipexole below 0.5 mg BID  08/30/21 appt noted: Pretty well.  Got married but she's not here yet.  Working on getting her into the [place].  Has a good immigration attorney here.  Usually takes a just a couple of mos but maybe longer  DT backlog.   Still taking meds Depakote 1000, duloxetine  120, lamotrigine 200, pramipexole 0.5 mg BID.   Stopped soda.  Working with nutritionist and down 35#. 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   Disc psychology and masculinity goals. No SE.   Satisfied with meds and no indication to change. Plan: continue Depakote 1000, duloxetine 120, lamotrigine 200, pramipexole 0.5 mg BID.   Failed attempt to reduce pramipexole below 0.5 mg BID  01/01/22 appt noted: Going to [employer].   Very good mental health.   New wife is delayed getting here bc slow immigration.  She's in [place] right now.   Got married in [place]   OK with meds. CKD stable. Plan: No med changes indicated yet.  Sig risk in switching his primary mood stabilizer unless its absolutely nececessary. Depakote 1000, duloxetine 120, lamotrigine 200, pramipexole 0.5 mg BID.    05/01/2022 appointment with the following noted: March 15 dizzy and dx with stroke.  Thinks it might be related to untreated OSA bc CPAP machine burned up and couldn't get another.  O2 levels low in hospital in sleep. Appt  to see sleep doc soon.  Sleeping with O2 now.   No depression occurred.  Handled it well.  Lost 52# this year. Thankful for meds.   Sees Dr. Richardean Chimera 7/6 Applied for expedited Visa for wife. Plan: No med changes indicated.  Sig risk in switching his primary mood stabilizer unless its absolutely nececessary. Depakote 1000, duloxetine 120, lamotrigine 200, pramipexole 0.5 mg BID.  Gabapentin 400 BID Failed attempt to reduce pramipexole below 0.5 mg BID  09/17/22 appt noted: 06/30/22 ED stroke  then had rehab. Stroke in March mild but not again in 8/23.  Slowly better. Walking 2-4 miles daily and it has helped. Enjoys [employer] sports Patient reports stable mood and denies depressed or irritable moods.   Patient denies any recent difficulty with anxiety.  Patient denies difficulty with sleep initiation or maintenance.  Not Using CPAP some issues getting O2.. Sleep hygiene  is better now and that' s helped.  Going to bed earlier.   Denies appetite disturbance.  Patient reports that energy and motivation have been good.  Patient denies any difficulty with concentration.  Patient denies any suicidal ideation. Mood good wihtout swings or depression.   Satisfied with meds. No alcohol.   Plan no med changes from above  01/21/23 appt noted: Continues meds as above.  Tolerating meds except constipation.  Miralax helping. Been doing good but youngest cat died and he is grieving.  It was his favorite pet.  Didn't have to put her down.  Happened 2 mos ago.  His cat was Edwin [name].  He was a happy cat.   No mood  swings.   Been going to [facility] basketball games.   Visa process is going slow to get sig other (wife) here from [employer]. Taking a long time.   No mood swings.    05/27/23 appt noted: Newly married and wife here for 2 days.  4 yr process.  W Edwin [name].   Enjoys teaching and playing piano.   Playing some at [employer]. Continue meds. No new SE.  ? Wt gain related. Patient reports stable mood and denies depressed or irritable moods.  Patient denies any recent difficulty with anxiety.  Patient denies difficulty with sleep initiation or maintenance. Denies appetite disturbance.  Patient reports that energy and motivation have been good.  Patient denies any difficulty with concentration.  Patient denies any suicidal ideation. Has seen neuro and on gabapentin and Lyrica.   Still some PT as needed after the stroke.  Watching BP Hard losing another cat in May which mother named.     M died 2019-07-11.  Was a relief for her.    Past Psychiatric Medication Trials: Lithium increased Cr and switch to VPA 2012,  Depakote 1000, lamotrigine,  gabapentin, Vraylar tremor, Rexulti SE, Abilify increased weight,   Wellbutrin NR, phentermine,   Pramipexole 0.5 mg BID L-Carnitine  Psych hosp 1986 helpful   Review of Systems:  Review of Systems  Constitutional:  Negative for  unexpected weight change.  Cardiovascular:  Negative for palpitations.  Musculoskeletal:  Positive for arthralgias.  Neurological:  Positive for weakness. Negative for tremors.  Psychiatric/Behavioral:  Negative for agitation, behavioral problems, confusion, decreased concentration, hallucinations, self-injury, sleep disturbance and suicidal ideas. The patient is not hyperactive.   No longer depressed.   Medications: I have reviewed the patient's current medications.  Current Outpatient Medications  Medication Sig Dispense Refill   amLODipine-atorvastatin (CADUET) 5-10 MG tablet Take 1 tablet by mouth daily.     Cholecalciferol 1000 units TBDP Take 1 tablet by mouth daily.      clindamycin (CLEOCIN) 300 MG capsule Take 1 capsule (300 mg total) by mouth 4 (four) times daily. X 7 days 28 capsule 0   clopidogrel (PLAVIX) 75 MG tablet TAKE ONE TABLET BY MOUTH DAILY 90 tablet 1   dapagliflozin propanediol (FARXIGA) 10 MG TABS tablet Take 10 mg by mouth daily.     diclofenac Sodium (VOLTAREN) 1 % GEL Apply 2 g topically daily as needed (for knee pain).     divalproex (DEPAKOTE ER) 500 MG 24 hr tablet Take 2 tablets (1,000 mg total) by mouth at bedtime. 120 tablet 0   DULoxetine (CYMBALTA) 60 MG capsule Take 2 capsules (120 mg total) by mouth daily. 120 capsule 0   febuxostat (ULORIC) 40 MG tablet Take 40 mg by mouth daily.     furosemide (LASIX) 20 MG tablet Take 20 mg by mouth daily as needed for edema. May take 2 to 3 days weekly     gabapentin (NEURONTIN) 400 MG capsule Take 400 mg by mouth 2 (two) times daily.     glucosamine-chondroitin 500-400 MG tablet Take 1 tablet by mouth 1 day or 1 dose.     hydrALAZINE (APRESOLINE) 50 MG tablet Take 1 tablet (50 mg total) by mouth 3 (three) times daily.     KERENDIA 10 MG TABS Take 1 tablet by mouth daily.     lamoTRIgine (LAMICTAL) 200 MG tablet TAKE ONE TABLET BY MOUTH IN THE MORNING 30 tablet 0   LevOCARNitine (L-CARNITINE) 500 MG TABS Take 500 mg  by  mouth 2 (two) times daily.     losartan (COZAAR) 100 MG tablet Take 100 mg by mouth daily.     metFORMIN (GLUCOPHAGE-XR) 500 MG 24 hr tablet Take 500 mg by mouth 2 (two) times daily.     multivitamin-lutein (OCUVITE-LUTEIN) CAPS capsule Take 1 capsule by mouth daily.     pramipexole (MIRAPEX) 0.5 MG tablet TAKE ONE TABLET BY MOUTH TWICE DAILY 60 tablet 0   pregabalin (LYRICA) 25 MG capsule Take 1 capsule (25 mg total) by mouth 2 (two) times daily. 30 capsule 2   tamsulosin (FLOMAX) 0.4 MG CAPS capsule Take 0.4 mg by mouth daily.     No current facility-administered medications for this visit.    Medication Side Effects: Other: weight gain.  Allergies:  Allergies  Allergen Reactions   Allopurinol Other (See Comments)    Made pt emotionally unstable  Other reaction(s): emotionally labile   Penicillins Hives and Other (See Comments)    Has patient had a PCN reaction causing immediate rash, facial/tongue/throat swelling, SOB or lightheadedness with hypotension: No Has patient had a PCN reaction causing severe rash involving mucus membranes or skin necrosis: No Has patient had a PCN reaction that required hospitalization No Has patient had a PCN reaction occurring within the last 10 years: No If all of the above answers are "NO", then may proceed with Cephalosporin use.   Finerenone Other (See Comments)    hyperkalemia Other reaction(s): Increases  potassium levels   Penicillin G     Other reaction(s): hives   Aspirin Hives   Ciprofloxacin Rash    Other reaction(s): rash    Past Medical History:  Diagnosis Date   Allergic rhinitis    Bipolar disorder (HCC)    Bradycardia 2012    due to lithium   CKD (chronic kidney disease) stage 2, GFR 60-89 ml/min    Gout    Hypertension    Mild renal insufficiency    , with creatinine of 1.3 in 2010, was side effect of med   Obesity    ,moderate   Prostatitis    , Episodic   Stroke (HCC)    Suicide attempt (HCC) 09/20/2014     Family History  Problem Relation Age of Onset   Other Mother        Viral Meningitis   Mitral valve prolapse Mother    Arrhythmia Mother    Hypothyroidism Mother    Stroke Mother    Hypertension Father    Gout Father    Alzheimer's disease Father     Social History   Socioeconomic History   Marital status: Married    Spouse name: Edwin [name]   Number of children: 0   Years of education: Not on file   Highest education level: Master's degree (e.g., MA, MS, MEng, MEd, MSW, MBA)  Occupational History   Not on file  Tobacco Use   Smoking status: Never    Passive exposure: Never   Smokeless tobacco: Never  Substance and Sexual Activity   Alcohol use: No   Drug use: No   Sexual activity: Not on file  Other Topics Concern   Not on file  Social History Narrative   Lives alone   Left handed   Caffeine: 2 ice tea a day   Social Determinants of Health   Financial Resource Strain: Low Risk  (02/22/2022)   Overall Financial Resource Strain (CARDIA)    Difficulty of Paying Living Expenses: Not hard at all  Food Insecurity: No Food  Insecurity (06/19/2022)   Hunger Vital Sign    Worried About Running Out of Food in the Last Year: Never true    Ran Out of Food in the Last Year: Never true  Transportation Needs: No Transportation Needs (06/19/2022)   PRAPARE - Administrator, Civil Service (Medical): No    Lack of Transportation (Non-Medical): No  Physical Activity: Not on file  Stress: No Stress Concern Present (02/22/2022)   [employer] of Occupational Health - Occupational Stress Questionnaire    Feeling of Stress : Only a little  Social Connections: Not on file  Intimate Partner Violence: Not At Risk (02/22/2022)   Humiliation, Afraid, Rape, and Kick questionnaire    Fear of Current or Ex-Partner: No    Emotionally Abused: No    Physically Abused: No    Sexually Abused: No    Past Medical History, Surgical history, Social history, and Family history were  reviewed and updated as appropriate.   Please see review of systems for further details on the patient's review from today.   Objective:   Physical Exam:  There were no vitals taken for this visit.  Physical Exam Constitutional:      General: He is not in acute distress.    Appearance: He is well-developed.  Musculoskeletal:        General: No deformity.  Neurological:     Mental Status: He is alert and oriented to person, place, and time.     Motor: No tremor.     Coordination: Coordination normal.     Gait: Gait normal.  Psychiatric:        Attention and Perception: Attention normal. He is attentive.        Mood and Affect: Mood is not anxious or depressed. Affect is not labile, blunt, angry or inappropriate.        Speech: Speech is not rapid and pressured.        Behavior: Behavior normal.        Thought Content: Thought content normal. Thought content is not delusional. Thought content does not include homicidal or suicidal ideation. Thought content does not include suicidal plan.        Cognition and Memory: Cognition normal.        Judgment: Judgment normal.     Comments: Insight is good.  Some situational anxiety managed Talkative and pleasant.      Lab Review:     Component Value Date/Time   NA 137 05/22/2023 2249   K 4.2 05/22/2023 2249   CL 104 05/22/2023 2249   CO2 22 05/22/2023 2249   GLUCOSE 118 (H) 05/22/2023 2249   BUN 58 (H) 05/22/2023 2249   CREATININE 2.14 (H) 05/22/2023 2249   CALCIUM 9.9 05/22/2023 2249   PROT 6.1 (L) 07/01/2022 0352   ALBUMIN 3.7 07/01/2022 0352   AST 22 07/01/2022 0352   ALT 19 07/01/2022 0352   ALKPHOS 61 07/01/2022 0352   BILITOT 0.7 07/01/2022 0352   GFRNONAA 34 (L) 05/22/2023 2249   GFRAA >60 02/04/2017 0525       Component Value Date/Time   WBC 5.7 05/22/2023 2249   RBC 3.79 (L) 05/22/2023 2249   HGB 12.0 (L) 05/22/2023 2249   HCT 35.2 (L) 05/22/2023 2249   PLT 202 05/22/2023 2249   MCV 92.9 05/22/2023 2249    MCH 31.7 05/22/2023 2249   MCHC 34.1 05/22/2023 2249   RDW 13.0 05/22/2023 2249   LYMPHSABS 1.5 06/30/2022 1630   MONOABS 0.7  06/30/2022 1630   EOSABS 0.2 06/30/2022 1630   BASOSABS 0.0 06/30/2022 1630    No results [name] for: "POCLITH", "LITHIUM"   Lab Results  Component Value Date   VALPROATE 35 (L) 07/01/2022    Sleep study dx severe OSA AHI 53.   .res Assessment: Plan:    Edwin [name] was seen today for follow-up.  Diagnoses and all orders for this visit:  Bipolar II disorder (HCC)  PTSD (post-traumatic stress disorder)  Obstructive sleep apnea  30 min face to face time with patient was spent on counseling and coordination of care. We discussed  Mood remains stable . Switch from lithium to VPA in 2012 was followed up with CMP which was normal at the time except for elevated ammonia level.  Which was the reason for adding L-carnitine and this corrected the ammonia level.   Elevated liver enzymes resolved.  Supportive therapy dealing with his goal to get new wife to [place].  Also disc stages of grief losing his favorite cats in the last year and May 2023.   Counseling around adjusting to wife just moving here from [place]  Has not lived with another person since mother died.  Good response to the med combination.  Polypharmacy necessary.   Since 2017 on pramipexole has been much better with regard to mood stability.  He feels it causes weight gain but cannot maintain stability bc of it.  Using O2 consistently HS and he requires less sleep.  Disc sleep hygiene and getting adequate quantity for mental health.  Sleep deprivation increases risk mania.   No med changes indicated.  Sig risk in switching his primary mood stabilizer unless its absolutely nececessary. Depakote 1000, duloxetine 120, lamotrigine 200, pramipexole 0.5 mg BID.  Still on gabapentin 400 mg BID  Failed attempt to reduce pramipexole below 0.5 mg BID Disc risk impulsivity and mania with this but he's had  depression benefit without problems.  Constipation management 1.  Lots of water 2.  Powdered fiber supplement such as MiraLAX, Citrucel, etc. preferably with a meal 3.  2 stool softeners a day 4.  Milk of magnesia or magnesium tablets if needed  Elevated liver enzymes resolved.  FU 4 mos  Meredith Staggers, MD, DFAPA   Please see After Visit Summary for patient specific instructions.  Future Appointments  Date Time Provider Department Center  09/05/2023 10:00 AM Butch Penny, NP [facility] None     No orders of the defined types were placed in this encounter.      -------------------------------

## 2023-05-28 ENCOUNTER — Encounter: Payer: Medicare Other | Admitting: Physical Therapy

## 2023-05-29 ENCOUNTER — Encounter (HOSPITAL_COMMUNITY): Payer: Self-pay | Admitting: Emergency Medicine

## 2023-05-29 ENCOUNTER — Other Ambulatory Visit: Payer: Self-pay

## 2023-05-29 ENCOUNTER — Inpatient Hospital Stay (HOSPITAL_COMMUNITY)
Admission: EM | Admit: 2023-05-29 | Discharge: 2023-05-31 | DRG: 603 | Disposition: A | Discharge status: Home / Self Care | Payer: Medicare Other | Attending: Internal Medicine | Admitting: Internal Medicine

## 2023-05-29 DIAGNOSIS — Z880 Allergy status to penicillin: Secondary | ICD-10-CM

## 2023-05-29 DIAGNOSIS — D649 Anemia, unspecified: Secondary | ICD-10-CM | POA: Diagnosis not present

## 2023-05-29 DIAGNOSIS — Z9151 Personal history of suicidal behavior: Secondary | ICD-10-CM | POA: Diagnosis not present

## 2023-05-29 DIAGNOSIS — E1140 Type 2 diabetes mellitus with diabetic neuropathy, unspecified: Secondary | ICD-10-CM | POA: Diagnosis present

## 2023-05-29 DIAGNOSIS — R400 Somnolence: Secondary | ICD-10-CM | POA: Diagnosis not present

## 2023-05-29 DIAGNOSIS — E1122 Type 2 diabetes mellitus with diabetic chronic kidney disease: Secondary | ICD-10-CM | POA: Diagnosis not present

## 2023-05-29 DIAGNOSIS — Z8673 Personal history of transient ischemic attack (TIA), and cerebral infarction without residual deficits: Secondary | ICD-10-CM

## 2023-05-29 DIAGNOSIS — N1832 Chronic kidney disease, stage 3b: Secondary | ICD-10-CM | POA: Diagnosis present

## 2023-05-29 DIAGNOSIS — I440 Atrioventricular block, first degree: Secondary | ICD-10-CM | POA: Diagnosis not present

## 2023-05-29 DIAGNOSIS — Z886 Allergy status to analgesic agent status: Secondary | ICD-10-CM | POA: Diagnosis not present

## 2023-05-29 DIAGNOSIS — L03116 Cellulitis of left lower limb: Secondary | ICD-10-CM | POA: Diagnosis not present

## 2023-05-29 DIAGNOSIS — Z820 Family history of epilepsy and other diseases of the nervous system: Secondary | ICD-10-CM

## 2023-05-29 DIAGNOSIS — Z823 Family history of stroke: Secondary | ICD-10-CM | POA: Diagnosis not present

## 2023-05-29 DIAGNOSIS — Z7984 Long term (current) use of oral hypoglycemic drugs: Secondary | ICD-10-CM

## 2023-05-29 DIAGNOSIS — I129 Hypertensive chronic kidney disease with stage 1 through stage 4 chronic kidney disease, or unspecified chronic kidney disease: Secondary | ICD-10-CM | POA: Diagnosis present

## 2023-05-29 DIAGNOSIS — E1169 Type 2 diabetes mellitus with other specified complication: Secondary | ICD-10-CM | POA: Diagnosis not present

## 2023-05-29 DIAGNOSIS — M109 Gout, unspecified: Secondary | ICD-10-CM | POA: Diagnosis not present

## 2023-05-29 DIAGNOSIS — E11649 Type 2 diabetes mellitus with hypoglycemia without coma: Secondary | ICD-10-CM | POA: Diagnosis not present

## 2023-05-29 DIAGNOSIS — Z7902 Long term (current) use of antithrombotics/antiplatelets: Secondary | ICD-10-CM

## 2023-05-29 DIAGNOSIS — N179 Acute kidney failure, unspecified: Secondary | ICD-10-CM | POA: Diagnosis present

## 2023-05-29 DIAGNOSIS — Z8249 Family history of ischemic heart disease and other diseases of the circulatory system: Secondary | ICD-10-CM | POA: Diagnosis not present

## 2023-05-29 DIAGNOSIS — R001 Bradycardia, unspecified: Secondary | ICD-10-CM | POA: Diagnosis not present

## 2023-05-29 DIAGNOSIS — E669 Obesity, unspecified: Secondary | ICD-10-CM | POA: Diagnosis not present

## 2023-05-29 DIAGNOSIS — E1165 Type 2 diabetes mellitus with hyperglycemia: Secondary | ICD-10-CM | POA: Diagnosis not present

## 2023-05-29 DIAGNOSIS — R55 Syncope and collapse: Secondary | ICD-10-CM | POA: Diagnosis not present

## 2023-05-29 DIAGNOSIS — I10 Essential (primary) hypertension: Secondary | ICD-10-CM | POA: Diagnosis not present

## 2023-05-29 DIAGNOSIS — L0390 Cellulitis, unspecified: Secondary | ICD-10-CM | POA: Diagnosis not present

## 2023-05-29 DIAGNOSIS — F319 Bipolar disorder, unspecified: Secondary | ICD-10-CM | POA: Diagnosis not present

## 2023-05-29 DIAGNOSIS — Z881 Allergy status to other antibiotic agents status: Secondary | ICD-10-CM | POA: Diagnosis not present

## 2023-05-29 DIAGNOSIS — Z79899 Other long term (current) drug therapy: Secondary | ICD-10-CM | POA: Diagnosis not present

## 2023-05-29 DIAGNOSIS — G4733 Obstructive sleep apnea (adult) (pediatric): Secondary | ICD-10-CM | POA: Diagnosis present

## 2023-05-29 DIAGNOSIS — E11641 Type 2 diabetes mellitus with hypoglycemia with coma: Secondary | ICD-10-CM | POA: Diagnosis not present

## 2023-05-29 DIAGNOSIS — Z6833 Body mass index (BMI) 33.0-33.9, adult: Secondary | ICD-10-CM

## 2023-05-29 LAB — COMPREHENSIVE METABOLIC PANEL
ALT: 21 U/L (ref 0–44)
AST: 19 U/L (ref 15–41)
Albumin: 3.7 g/dL (ref 3.5–5.0)
Alkaline Phosphatase: 54 U/L (ref 38–126)
Anion gap: 12 (ref 5–15)
BUN: 42 mg/dL — ABNORMAL HIGH (ref 8–23)
CO2: 23 mmol/L (ref 22–32)
Calcium: 8.6 mg/dL — ABNORMAL LOW (ref 8.9–10.3)
Chloride: 102 mmol/L (ref 98–111)
Creatinine, Ser: 1.86 mg/dL — ABNORMAL HIGH (ref 0.61–1.24)
GFR, Estimated: 40 mL/min — ABNORMAL LOW (ref >60)
Glucose, Bld: 160 mg/dL — ABNORMAL HIGH (ref 70–99)
Potassium: 4.3 mmol/L (ref 3.5–5.1)
Sodium: 137 mmol/L (ref 135–145)
Total Bilirubin: 0.4 mg/dL (ref 0.3–1.2)
Total Protein: 6.5 g/dL (ref 6.5–8.1)

## 2023-05-29 LAB — CBC WITH DIFFERENTIAL/PLATELET
Abs Immature Granulocytes: 0.02 10*3/uL (ref 0.00–0.07)
Basophils Absolute: 0 10*3/uL (ref 0.0–0.1)
Basophils Relative: 1 %
Eosinophils Absolute: 0.2 10*3/uL (ref 0.0–0.5)
Eosinophils Relative: 4 %
HCT: 34.8 % — ABNORMAL LOW (ref 39.0–52.0)
Hemoglobin: 11.4 g/dL — ABNORMAL LOW (ref 13.0–17.0)
Immature Granulocytes: 0 %
Lymphocytes Relative: 15 %
Lymphs Abs: 0.8 10*3/uL (ref 0.7–4.0)
MCH: 31.1 pg (ref 26.0–34.0)
MCHC: 32.8 g/dL (ref 30.0–36.0)
MCV: 95.1 fL (ref 80.0–100.0)
Monocytes Absolute: 0.6 10*3/uL (ref 0.1–1.0)
Monocytes Relative: 11 %
Neutro Abs: 3.6 10*3/uL (ref 1.7–7.7)
Neutrophils Relative %: 69 %
Platelets: 184 10*3/uL (ref 150–400)
RBC: 3.66 MIL/uL — ABNORMAL LOW (ref 4.22–5.81)
RDW: 13 % (ref 11.5–15.5)
WBC: 5.3 10*3/uL (ref 4.0–10.5)
nRBC: 0 % (ref 0.0–0.2)

## 2023-05-29 LAB — BLOOD GAS, VENOUS
Acid-Base Excess: 2.4 mmol/L — ABNORMAL HIGH (ref 0.0–2.0)
Bicarbonate: 27.2 mmol/L (ref 20.0–28.0)
O2 Saturation: 63.4 %
Patient temperature: 36.5
pCO2, Ven: 41 mmHg — ABNORMAL LOW (ref 44–60)
pH, Ven: 7.43 (ref 7.25–7.43)
pO2, Ven: 35 mmHg (ref 32–45)

## 2023-05-29 LAB — GLUCOSE, CAPILLARY
Glucose-Capillary: 83 mg/dL (ref 70–99)
Glucose-Capillary: 83 mg/dL (ref 70–99)

## 2023-05-29 LAB — MAGNESIUM: Magnesium: 2.4 mg/dL (ref 1.7–2.4)

## 2023-05-29 LAB — LACTIC ACID, PLASMA
Lactic Acid, Venous: 1.7 mmol/L (ref 0.5–1.9)
Lactic Acid, Venous: 1.2 mmol/L (ref 0.5–1.9)

## 2023-05-29 MED ORDER — DULOXETINE HCL 60 MG PO CPEP
120.0000 mg | ORAL_CAPSULE | Freq: Every day | ORAL | Status: DC
  Administered 2023-05-29 – 2023-05-31 (×3): 120 mg via ORAL
  Filled 2023-05-29 (×3): qty 2

## 2023-05-29 MED ORDER — HYDRALAZINE HCL 50 MG PO TABS: 50.0000 mg | ORAL_TABLET | Freq: Every day | ORAL | Status: DC

## 2023-05-29 MED ORDER — ACETAMINOPHEN 325 MG PO TABS: 650.0000 mg | ORAL_TABLET | Freq: Four times a day (QID) | ORAL | Status: DC | PRN

## 2023-05-29 MED ORDER — ENOXAPARIN SODIUM 40 MG/0.4ML IJ SOSY
40.0000 mg | PREFILLED_SYRINGE | INTRAMUSCULAR | Status: DC
  Administered 2023-05-29 – 2023-05-30 (×2): 40 mg via SUBCUTANEOUS
  Filled 2023-05-29 (×2): qty 0.4

## 2023-05-29 MED ORDER — TAMSULOSIN HCL 0.4 MG PO CAPS
0.4000 mg | ORAL_CAPSULE | Freq: Every day | ORAL | Status: DC
  Administered 2023-05-30 – 2023-05-31 (×2): 0.4 mg via ORAL
  Filled 2023-05-29 (×2): qty 1

## 2023-05-29 MED ORDER — VANCOMYCIN HCL IN DEXTROSE 1-5 GM/200ML-% IV SOLN
1000.0000 mg | INTRAVENOUS | Status: DC
  Administered 2023-05-30 – 2023-05-31 (×2): 1000 mg via INTRAVENOUS
  Filled 2023-05-29 (×2): qty 200

## 2023-05-29 MED ORDER — CLOPIDOGREL BISULFATE 75 MG PO TABS
75.0000 mg | ORAL_TABLET | Freq: Every day | ORAL | Status: DC
  Administered 2023-05-30 – 2023-05-31 (×2): 75 mg via ORAL
  Filled 2023-05-29 (×2): qty 1

## 2023-05-29 MED ORDER — ONDANSETRON HCL 4 MG/2ML IJ SOLN: 4.0000 mg | Freq: Four times a day (QID) | INTRAMUSCULAR | Status: DC | PRN

## 2023-05-29 MED ORDER — ONDANSETRON HCL 4 MG PO TABS: 4.0000 mg | ORAL_TABLET | Freq: Four times a day (QID) | ORAL | Status: DC | PRN

## 2023-05-29 MED ORDER — INSULIN ASPART 100 UNIT/ML IJ SOLN: 0.0000 [IU] | Freq: Every day | INTRAMUSCULAR | Status: DC

## 2023-05-29 MED ORDER — POLYETHYLENE GLYCOL 3350 17 G PO PACK: 17.0000 g | PACK | Freq: Every day | ORAL | Status: DC | PRN

## 2023-05-29 MED ORDER — VANCOMYCIN HCL 1250 MG/250ML IV SOLN: 1250.0000 mg | INTRAVENOUS | Status: DC

## 2023-05-29 MED ORDER — ACETAMINOPHEN 650 MG RE SUPP: 650.0000 mg | Freq: Four times a day (QID) | RECTAL | Status: DC | PRN

## 2023-05-29 MED ORDER — DIVALPROEX SODIUM ER 500 MG PO TB24
1000.0000 mg | ORAL_TABLET | Freq: Every day | ORAL | Status: DC
  Administered 2023-05-29 – 2023-05-30 (×2): 1000 mg via ORAL
  Filled 2023-05-29 (×3): qty 2

## 2023-05-29 MED ORDER — FEBUXOSTAT 40 MG PO TABS
40.0000 mg | ORAL_TABLET | Freq: Every day | ORAL | Status: DC
  Administered 2023-05-30 – 2023-05-31 (×2): 40 mg via ORAL
  Filled 2023-05-29 (×2): qty 1

## 2023-05-29 MED ORDER — MELATONIN 3 MG PO TABS: 3.0000 mg | ORAL_TABLET | Freq: Every evening | ORAL | Status: DC | PRN

## 2023-05-29 MED ORDER — GABAPENTIN 400 MG PO CAPS
400.0000 mg | ORAL_CAPSULE | Freq: Two times a day (BID) | ORAL | Status: DC
  Administered 2023-05-29 – 2023-05-31 (×4): 400 mg via ORAL
  Filled 2023-05-29 (×4): qty 1

## 2023-05-29 MED ORDER — AMLODIPINE BESYLATE 5 MG PO TABS: 5.0000 mg | ORAL_TABLET | Freq: Every day | ORAL | Status: DC

## 2023-05-29 MED ORDER — LOSARTAN POTASSIUM 50 MG PO TABS: 100.0000 mg | ORAL_TABLET | Freq: Every day | ORAL | Status: DC

## 2023-05-29 MED ORDER — PREGABALIN 25 MG PO CAPS
25.0000 mg | ORAL_CAPSULE | Freq: Two times a day (BID) | ORAL | Status: DC
  Administered 2023-05-29 – 2023-05-31 (×4): 25 mg via ORAL
  Filled 2023-05-29 (×4): qty 1

## 2023-05-29 MED ORDER — PANTOPRAZOLE SODIUM 40 MG PO TBEC
40.0000 mg | DELAYED_RELEASE_TABLET | Freq: Every day | ORAL | Status: DC
  Administered 2023-05-30 – 2023-05-31 (×2): 40 mg via ORAL
  Filled 2023-05-29 (×2): qty 1

## 2023-05-29 MED ORDER — VANCOMYCIN HCL 1250 MG/250ML IV SOLN
1250.0000 mg | Freq: Once | INTRAVENOUS | Status: AC
  Administered 2023-05-29: 1250 mg via INTRAVENOUS
  Filled 2023-05-29: qty 250

## 2023-05-29 MED ORDER — LAMOTRIGINE 100 MG PO TABS
200.0000 mg | ORAL_TABLET | Freq: Every morning | ORAL | Status: DC
  Administered 2023-05-30 – 2023-05-31 (×2): 200 mg via ORAL
  Filled 2023-05-29 (×2): qty 2

## 2023-05-29 MED ORDER — PRAMIPEXOLE DIHYDROCHLORIDE 0.25 MG PO TABS
0.5000 mg | ORAL_TABLET | Freq: Two times a day (BID) | ORAL | Status: DC
  Administered 2023-05-29 – 2023-05-31 (×4): 0.5 mg via ORAL
  Filled 2023-05-29 (×6): qty 2

## 2023-05-29 MED ORDER — INSULIN ASPART 100 UNIT/ML IJ SOLN
0.0000 [IU] | Freq: Three times a day (TID) | INTRAMUSCULAR | Status: DC
  Administered 2023-05-30: 3 [IU] via SUBCUTANEOUS

## 2023-05-29 NOTE — Progress Notes (Addendum)
Pharmacy Antibiotic Note  DEANTA MINCEY is a 67 y.o. male admitted on 05/29/2023 with cellulitis. Previously failed Keflex QID, doxycycline, and clindamycin.  Pharmacy has been consulted for vancomycin dosing.  Plan: Vancomycin 1250 mg IV now, then 1000 mg IV q24 hr (est AUC 487 based on SCr 1.86; Vd 0.5) Measure vancomycin AUC at steady state as indicated SCr daily x3 while on vanc   Height: 5\' 10"  (177.8 cm) Weight: 106.9 kg (235 lb 10.8 oz) IBW/kg (Calculated) : 73  Temp (24hrs), Avg:98 F (36.7 C), Min:97.7 F (36.5 C), Max:98.3 F (36.8 C)  Recent Labs  Lab 05/22/23 2249 05/29/23 1222 05/29/23 1411  WBC 5.7 5.3  --   CREATININE 2.14* 1.86*  --   LATICACIDVEN  --  1.7 1.2    Estimated Creatinine Clearance: 48.5 mL/min (A) (by C-G formula based on SCr of 1.86 mg/dL (H)).    Allergies  Allergen Reactions   Allopurinol Other (See Comments)    Made pt emotionally unstable  Other reaction(s): emotionally labile   Penicillins Hives and Other (See Comments)   Finerenone Other (See Comments)    hyperkalemia Other reaction(s): Increases  potassium levels   Penicillin G     Other reaction(s): hives   Aspirin Hives   Ciprofloxacin Rash    Other reaction(s): rash    Antimicrobials this admission: 7/10 vancomycin >>   Dose adjustments this admission: N/a  Microbiology results: N/a  Thank you for allowing pharmacy to be a part of this patient's care.  Nyana Haren A 05/29/2023 4:48 PM

## 2023-05-29 NOTE — Progress Notes (Addendum)
The patient arrived to unit, Hospitalist provider notified of arrival. Patient is alert, oriented x4, Pt appears to be drowsy, easy to arouse, ambulatory 1 assist. Denies dizziness, weakness, CP, SOB, or N/V. Gait is steady. HR 48-60 regular rhythm on admission. EKG completed initially by NT, which showed abnormality. See Second EKG completed. Hospitalist/Cardiology updated.

## 2023-05-29 NOTE — H&P (Signed)
History and Physical    Patient: Edwin [name] ZOX:[id number] DOB: Mar 09, 1956 DOA: 05/29/2023 DOS: the patient was seen and examined on 05/29/2023 PCP: Deatra James, MD  Patient coming from: Home  Chief Complaint:  Chief Complaint  Patient presents with   Cellulitis   HPI: Edwin [name] is a 66 y.o. male with medical history significant of prior stroke, hypertension, type II diabetes mellitus, bipolar, chronic kidney disease, sleep apnea, and gout. He presents to [facility] ED with ongoing erythema, edema, and pain to his left lower extremity. He was diagnosed with cellulitis approximately 2.5 weeks ago and has failed outpatient antibiotic therapy having been prescribed Keflex, Doxycycline, and Clindamycin with minimal improvement of symptoms. He has 2 recent negative Dopplers of the left lower extremity with the most recent dated 05/23/2023.  He denies dyspnea and chest pain.  ED Course: In the ED a CBC, CMP, Lactic acid were drawn. Lactic acid negative x 2.  No leukocytosis. Hemoglobin 11.4. Glucose elevated at 160, BUN 43, creatinine 1.86 appears to be around his baseline.  Pictures of lower extremities placed in media tab by EDP. He did not receive any medications in the ED. Hospitalist contacted for admission.  Update: After arriving to Med Surg unit patient noted to be falling asleep while staff were talking with him and admission EKG showed Sinus Bradycardia with first degree AVB. 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 He states he awakens only after his head falls forward. Denies dizziness, palpitations, chest pain, or dyspnea. Will transfer to Progressive and monitor on telemetry as well as add Syncope workup.  Review of Systems: As mentioned in the history of present illness. All other systems reviewed and are negative. Past Medical History:  Diagnosis Date   Allergic rhinitis    Bipolar disorder (HCC)    Bradycardia 2012    due  to lithium   CKD (chronic kidney disease) stage 2, GFR 60-89 ml/min    Gout    Hypertension    Mild renal insufficiency    , with creatinine of 1.3 in 2010, was side effect of med   Obesity    ,moderate   Prostatitis    , Episodic   Stroke [facility])    Suicide attempt (HCC) 09/20/2014   Past Surgical History:  Procedure Laterality Date   APPENDECTOMY     CATARACT EXTRACTION Right    COLONOSCOPY WITH PROPOFOL N/A 06/14/2014   Procedure: COLONOSCOPY WITH PROPOFOL;  Surgeon: Charolett Bumpers, MD;  Location: [facility] ENDOSCOPY;  Service: Endoscopy;  Laterality: N/A;   TONSILLECTOMY     UMBILICAL HERNIA REPAIR     Social History:  reports that he has never smoked. He has never been exposed to tobacco smoke. He has never used smokeless tobacco. He reports that he does not drink alcohol and does not use drugs.  Allergies  Allergen Reactions   Allopurinol Other (See Comments)    Made pt emotionally unstable  Other reaction(s): emotionally labile   Penicillins Hives and Other (See Comments)   Finerenone Other (See Comments)    hyperkalemia Other reaction(s): Increases  potassium levels   Penicillin G     Other reaction(s): hives   Aspirin Hives   Ciprofloxacin Rash    Other reaction(s): rash    Family History  Problem Relation Age of Onset   Other Mother        Viral Meningitis   Mitral valve prolapse Mother    Arrhythmia Mother  Hypothyroidism Mother    Stroke Mother    Hypertension Father    Gout Father    Alzheimer's disease Father     Prior to Admission medications   Medication Sig Start Date End Date Taking? Authorizing Provider  amLODipine (NORVASC) 5 MG tablet Take 5 mg by mouth daily. 05/28/23  Yes [provider]  cephALEXin (KEFLEX) 500 MG capsule Take 500 mg by mouth every 6 (six) hours.   Yes [provider]  Cholecalciferol 1000 units TBDP Take 1 tablet by mouth daily.    Yes [provider]  clopidogrel (PLAVIX) 75 MG tablet TAKE ONE  TABLET BY MOUTH DAILY 04/23/23  Yes Butch Penny, NP  dapagliflozin propanediol (FARXIGA) 10 MG TABS tablet Take 10 mg by mouth daily.   Yes [provider]  divalproex (DEPAKOTE ER) 500 MG 24 hr tablet Take 2 tablets (1,000 mg total) by mouth at bedtime. 05/27/23  Yes Cottle, Steva Ready., MD  DULoxetine (CYMBALTA) 60 MG capsule Take 2 capsules (120 mg total) by mouth daily. 05/27/23  Yes Cottle, Steva Ready., MD  febuxostat (ULORIC) 40 MG tablet Take 40 mg by mouth daily.   Yes [provider]  furosemide (LASIX) 20 MG tablet Take 20 mg by mouth daily as needed for edema. May take 2 to 3 days weekly 07/21/19  Yes [provider]  gabapentin (NEURONTIN) 400 MG capsule Take 400 mg by mouth 2 (two) times daily.   Yes [provider]  hydrALAZINE (APRESOLINE) 50 MG tablet Take 1 tablet (50 mg total) by mouth 3 (three) times daily. Patient taking differently: Take 50 mg by mouth daily. 02/06/22  Yes Ghimire, Werner Lean, MD  KERENDIA 10 MG TABS Take 1 tablet by mouth daily. 03/18/23  Yes [provider]  lamoTRIgine (LAMICTAL) 200 MG tablet TAKE ONE TABLET BY MOUTH IN THE MORNING 05/19/23  Yes Cottle, Steva Ready., MD  LevOCARNitine (L-CARNITINE) 500 MG TABS Take 500 mg by mouth 2 (two) times daily.   Yes [provider]  losartan (COZAAR) 100 MG tablet Take 100 mg by mouth daily. 06/29/22  Yes [provider]  metFORMIN (GLUCOPHAGE-XR) 500 MG 24 hr tablet Take 500 mg by mouth 2 (two) times daily. 01/07/22  Yes [provider]  multivitamin-lutein (OCUVITE-LUTEIN) CAPS capsule Take 1 capsule by mouth daily.   Yes [provider]  pramipexole (MIRAPEX) 0.5 MG tablet Take 1 tablet (0.5 mg total) by mouth 2 (two) times daily. 05/27/23  Yes Cottle, Steva Ready., MD  pregabalin (LYRICA) 25 MG capsule Take 1 capsule (25 mg total) by mouth 2 (two) times daily. 05/14/23  Yes Micki Riley, MD  tamsulosin (FLOMAX) 0.4 MG CAPS capsule Take 0.4 mg by  mouth daily. 06/04/22  Yes [provider]  VELTASSA 8.4 g packet Take 8.4 g by mouth See admin instructions. Take one packet by mouth three times a week. Days vary.   Yes [provider]  clindamycin (CLEOCIN) 300 MG capsule Take 1 capsule (300 mg total) by mouth 4 (four) times daily. X 7 days Patient not taking: Reported on 05/29/2023 05/23/23   Geoffery Lyons, MD  doxycycline (VIBRA-TABS) 100 MG tablet Take 100 mg by mouth 2 (two) times daily. Patient not taking: Reported on 05/29/2023 05/18/23   [provider]    Physical Exam: Vitals:   05/29/23 1204 05/29/23 1207  BP: 114/69   Pulse: 79   Resp: 18   Temp: 98.3 F (36.8 C)  TempSrc: Oral   SpO2: 96%   Weight:  104 kg  Height:  5\' 10"  (1.778 m)   Constitutional: NAD, calm, comfortable Eyes: PERRL, lids and conjunctivae normal ENMT: Mucous membranes are moist. Posterior pharynx clear of any exudate or lesions. Neck: normal, supple, no masses, no thyromegaly Respiratory: clear to auscultation bilaterally, no wheezing, no crackles. Normal respiratory effort. No accessory muscle use.  Cardiovascular: Regular rate and rhythm, (+) murmurs, no rubs or gallops. No extremity edema. 2+ pedal pulses.  Abdomen: non tender, non distended. Bowel sounds positive.  Musculoskeletal: no clubbing / cyanosis. No joint deformity upper and lower extremities. Good ROM, no contractures. Normal muscle tone. LLE edema and erythema Skin: LLE erythema, healing laceration noted Neurologic: Alert and oriented x3 Psychiatric: Mood appropriate  Data Reviewed:  CBC    Component Value Date/Time   WBC 5.3 05/29/2023 1222   RBC 3.66 (L) 05/29/2023 1222   HGB 11.4 (L) 05/29/2023 1222   HCT 34.8 (L) 05/29/2023 1222   PLT 184 05/29/2023 1222   MCV 95.1 05/29/2023 1222   MCH 31.1 05/29/2023 1222   MCHC 32.8 05/29/2023 1222   RDW 13.0 05/29/2023 1222   LYMPHSABS 0.8 05/29/2023 1222   MONOABS 0.6 05/29/2023 1222   EOSABS 0.2  05/29/2023 1222   BASOSABS 0.0 05/29/2023 1222   CMP     Component Value Date/Time   NA 137 05/29/2023 1222   K 4.3 05/29/2023 1222   CL 102 05/29/2023 1222   CO2 23 05/29/2023 1222   GLUCOSE 160 (H) 05/29/2023 1222   BUN 42 (H) 05/29/2023 1222   CREATININE 1.86 (H) 05/29/2023 1222   CALCIUM 8.6 (L) 05/29/2023 1222   PROT 6.5 05/29/2023 1222   ALBUMIN 3.7 05/29/2023 1222   AST 19 05/29/2023 1222   ALT 21 05/29/2023 1222   ALKPHOS 54 05/29/2023 1222   BILITOT 0.4 05/29/2023 1222   GFRNONAA 40 (L) 05/29/2023 1222   Lactic Acid, Venous    Component Value Date/Time   LATICACIDVEN 1.2 05/29/2023 1411       EKG Interpretation: Sinus Bradycardia with First Degree AV Block HR 51 PR interval .216s  Assessment and Plan: #Left Lower Extremity Cellulitis Patient presenting with approximately 2.5 weeks of left lower extremity erythema, edema, pain,and warmth. No abscess noted.  There is a healing laceration present that patient does not recall how he got it. Spoke with pharmacy who recommended vancomycin monotherapy after failure of Keflex, doxycycline, and clindamycin. Patient has had 2 recent negative left lower extremity Dopplers with the most recent being 05/23/2023. - Vancomycin per pharmacy - Will transition back to p.o. antibiotics pending clinical course.  #Sinus Bradycardia with 1st Degree AV Block EKG obtained on admission to med-surg unit which noted Sinus Bradycardia with 1st Degree AVB. Previous intraventricular conduction delays noted without bradycardia, previous Bundle Branch blocks noted. Patient is now drowsy and hemodynamically stable. EKGs reviewed by Dr Allyson Sabal with Cardiology who found them unremarkable.  - Monitor on telemetry  #Syncopal Episodes Patient reports he falls asleep for 1-5 minutes while teaching [employer]. This is not preceded by dizziness or palpitations. He denies chest pain or dyspnea. No neurological deficits noted on exam or interview. -  Orthostatic vital signs  - ECHO - Monitor on telemetry - Neuro checks - VBG - Will consider CT Head if above workup unrevealing  #Hypertension - Hold home BP meds  - Will restart if not orthostatic  #Type II Diabetes Mellitus #Peripheral Neuropathy - AC/HS CBG monitoring +SSI -  Continue home Gabapentin and Lyrica (Only able to tolerate 400 mg of gabapentin, Lyrica recently added to outpatient regimen)  # Obstructive Sleep Apnea Uses dental appliance at home - Nocturnal Oxygen as needed  #CKD Stage III Creatinine 1.86 on BMP today this appears to be in line with his baseline - Repeat BMP with AM labs  #Bipolar Disorder - Continue home Depakote and Lamictal  VTE prophylaxis: Lovenox GI prophylaxis: Protonix Diet: Regular Access: PIV Lines: NONE Telemetry: Yes Disposition: Admit to Med-Surg, Transfer to Progressive    Advance Care Planning: Code Status: FULL  Consults: None  Family Communication: wife at bedside  Severity of Illness: The appropriate patient status for this patient is INPATIENT. Inpatient status is judged to be reasonable and necessary in order to provide the required intensity of service to ensure the patient's safety. The patient's presenting symptoms, physical exam findings, and initial radiographic and laboratory data in the context of their chronic comorbidities is felt to place them at high risk for further clinical deterioration. Furthermore, it is not anticipated that the patient will be medically stable for discharge from the hospital within 2 midnights of admission.   * I certify that at the point of admission it is my clinical judgment that the patient will require inpatient hospital care spanning beyond 2 midnights from the point of admission due to high intensity of service, high risk for further deterioration and high frequency of surveillance required.*  To reach the provider On-Call:   7AM- 7PM see care teams to locate the attending and  reach out to them via [email or web address] Password: [email or web address] 7PM-7AM contact night-coverage If you still have difficulty reaching the appropriate provider, please page the [facility] (Director on Call) for [facility] on amion for assistance  This document was prepared using Conservation officer, historic buildings and may include unintentional dictation errors.  Bishop Limbo DNP, MBA, FNP-BC, PMHNP-BC Nurse Practitioner [facility] Pager [phone]

## 2023-05-29 NOTE — ED Provider Notes (Signed)
[place] EMERGENCY DEPARTMENT AT [facility] Provider Note   CSN: [id number] Arrival date & time: 05/29/23  1150     History  Chief Complaint  Patient presents with   Cellulitis    Edwin [name] is a 67 y.o. male.  HPI Patient with redness and swelling of left lower leg.  Sent in reportedly for admission for IV antibiotics.  Has been on clindamycin.  Has had 2 negative Dopplers for the same.  Worsening redness to potentially stable redness from ER visit 6 days ago.  Follow-up with PCP and sent in here.  No chest pain or difficulty breathing.   Past Medical History:  Diagnosis Date   Allergic rhinitis    Bipolar disorder (HCC)    Bradycardia 2012    due to lithium   CKD (chronic kidney disease) stage 2, GFR 60-89 ml/min    Gout    Hypertension    Mild renal insufficiency    , with creatinine of 1.3 in 2010, was side effect of med   Obesity    ,moderate   Prostatitis    , Episodic   Stroke [facility])    Suicide attempt (HCC) 09/20/2014    Home Medications Prior to Admission medications   Medication Sig Start Date End Date Taking? Authorizing Provider  amLODipine-atorvastatin (CADUET) 5-10 MG tablet Take 1 tablet by mouth daily.    [provider]  Cholecalciferol 1000 units TBDP Take 1 tablet by mouth daily.     [provider]  clindamycin (CLEOCIN) 300 MG capsule Take 1 capsule (300 mg total) by mouth 4 (four) times daily. X 7 days 05/23/23   Geoffery Lyons, MD  clopidogrel (PLAVIX) 75 MG tablet TAKE ONE TABLET BY MOUTH DAILY 04/23/23   Butch Penny, NP  dapagliflozin propanediol (FARXIGA) 10 MG TABS tablet Take 10 mg by mouth daily.    [provider]  diclofenac Sodium (VOLTAREN) 1 % GEL Apply 2 g topically daily as needed (for knee pain).    [provider]  divalproex (DEPAKOTE ER) 500 MG 24 hr tablet Take 2 tablets (1,000 mg total) by mouth at bedtime. 05/27/23   Cottle, Steva Ready., MD  DULoxetine (CYMBALTA) 60 MG  capsule Take 2 capsules (120 mg total) by mouth daily. 05/27/23   Cottle, Steva Ready., MD  febuxostat (ULORIC) 40 MG tablet Take 40 mg by mouth daily.    [provider]  furosemide (LASIX) 20 MG tablet Take 20 mg by mouth daily as needed for edema. May take 2 to 3 days weekly 07/21/19   [provider]  gabapentin (NEURONTIN) 400 MG capsule Take 400 mg by mouth 2 (two) times daily.    [provider]  glucosamine-chondroitin 500-400 MG tablet Take 1 tablet by mouth 1 day or 1 dose.    [provider]  hydrALAZINE (APRESOLINE) 50 MG tablet Take 1 tablet (50 mg total) by mouth 3 (three) times daily. 02/06/22   Ghimire, Werner Lean, MD  KERENDIA 10 MG TABS Take 1 tablet by mouth daily. 03/18/23   [provider]  lamoTRIgine (LAMICTAL) 200 MG tablet TAKE ONE TABLET BY MOUTH IN THE MORNING 05/19/23   Cottle, Steva Ready., MD  LevOCARNitine (L-CARNITINE) 500 MG TABS Take 500 mg by mouth 2 (two) times daily.    [provider]  losartan (COZAAR) 100 MG tablet Take 100 mg by mouth daily. 06/29/22   [provider]  metFORMIN (GLUCOPHAGE-XR) 500 MG 24 hr tablet Take  500 mg by mouth 2 (two) times daily. 01/07/22   [provider]  multivitamin-lutein (OCUVITE-LUTEIN) CAPS capsule Take 1 capsule by mouth daily.    [provider]  pramipexole (MIRAPEX) 0.5 MG tablet Take 1 tablet (0.5 mg total) by mouth 2 (two) times daily. 05/27/23   Cottle, Steva Ready., MD  pregabalin (LYRICA) 25 MG capsule Take 1 capsule (25 mg total) by mouth 2 (two) times daily. 05/14/23   Micki Riley, MD  tamsulosin (FLOMAX) 0.4 MG CAPS capsule Take 0.4 mg by mouth daily. 06/04/22   [provider]      Allergies    Allopurinol, Penicillins, Finerenone, Penicillin g, Aspirin, and Ciprofloxacin    Review of Systems   Review of Systems  Physical Exam Updated Vital Signs BP 114/69 (BP Location: Left Arm)   Pulse 79   Temp 98.3 F (36.8 C) (Oral)    Resp 18   Ht 5\' 10"  (1.778 m)   Wt 104 kg   SpO2 96%   BMI 32.90 kg/m  Physical Exam Vitals reviewed.  Cardiovascular:     Rate and Rhythm: Regular rhythm.  Pulmonary:     Breath sounds: No wheezing.  Abdominal:     Tenderness: There is no abdominal tenderness.  Musculoskeletal:     Right lower leg: Edema present.     Left lower leg: Edema present.     Comments: Pitting edema bilateral lower extremities, however much worse on left.  Does have erythema on the left.  Does have healing laceration.  Neurological:     Mental Status: He is alert.        ED Results / Procedures / Treatments   Labs (all labs ordered are listed, but only abnormal results are displayed) Labs Reviewed  COMPREHENSIVE METABOLIC PANEL - Abnormal; Notable for the following components:      Result Value   Glucose, Bld 160 (*)    BUN 42 (*)    Creatinine, Ser 1.86 (*)    Calcium 8.6 (*)    GFR, Estimated 40 (*)    All other components within normal limits  CBC WITH DIFFERENTIAL/PLATELET - Abnormal; Notable for the following components:   RBC 3.66 (*)    Hemoglobin 11.4 (*)    HCT 34.8 (*)    All other components within normal limits  LACTIC ACID, PLASMA  LACTIC ACID, PLASMA    EKG None  Radiology No results found.  Procedures Procedures    Medications Ordered in ED Medications - No data to display  ED Course/ Medical Decision Making/ A&P                             Medical Decision Making Amount and/or Complexity of Data Reviewed Labs: ordered.   Patient with lower extremity edema.  Left worse than right.  Erythema.  Has been on outpatient antibiotics and follow-up PCP.  Sent for admission.  White count reassuring.  Mild warmth on the leg.  Reviewed 2 previous negative Dopplers.  Reviewed PCP note.  Normal lactic acid.  Will discuss with hospitalist for admission.         Final Clinical Impression(s) / ED Diagnoses Final diagnoses:  Cellulitis of left lower extremity     Rx / DC Orders ED Discharge Orders     None         Benjiman Core, MD 05/29/23 1333

## 2023-05-29 NOTE — ED Triage Notes (Signed)
Pt sent by PCP for admission and IV abx. Negative DVT study 7/3. Pt on oral abx but not responding. Cellulitis to left leg, redness and swelling noted from knee down.

## 2023-05-29 NOTE — ED Notes (Signed)
ED TO INPATIENT HANDOFF REPORT  Name/Age/Gender Edwin [name] 67 y.o. male  Code Status Code Status History     Date Active Date Inactive Code Status Order ID Comments User Context   07/01/2022 0218 07/01/2022 2117 Full Code [id number]  Eduard Clos, MD Inpatient   02/04/2022 1811 02/05/2022 2110 Full Code [id number]  Teddy Spike, DO Inpatient   02/04/2022 0512 02/04/2022 0603 Full Code [id number]  Eduard Clos, MD ED   02/01/2017 0948 02/04/2017 1943 Full Code [id number]  Kathlen Mody, MD Inpatient   05/19/2016 0244 05/20/2016 2029 Full Code [id number]  Lorretta Harp, MD ED   09/20/2014 2324 09/23/2014 1817 Full Code [id number]  Nanine Means, NP Inpatient   09/20/2014 0151 09/20/2014 2324 Full Code [id number]  Raeford Razor, MD ED       Home/SNF/Other Home  Chief Complaint Cellulitis [L03.90]  Level of Care/Admitting Diagnosis ED Disposition     ED Disposition  Admit   Condition  --   Comment  Hospital Area: [facility] [[id number]]  Level of Care: Med-Surg [16]  May admit patient to [facility] or [facility] if equivalent level of care is available:: Yes  Covid Evaluation: Asymptomatic - no recent exposure (last 10 days) testing not required  Diagnosis: Cellulitis [742595]  Admitting Physician: Kathlen Mody [[id number]]  Attending Physician: Kathlen Mody [[id number]]  Certification:: I certify this patient will need inpatient services for at least 2 midnights  Estimated Length of Stay: 2          Medical History Past Medical History:  Diagnosis Date   Allergic rhinitis    Bipolar disorder (HCC)    Bradycardia 2012    due to lithium   CKD (chronic kidney disease) stage 2, GFR 60-89 ml/min    Gout    Hypertension    Mild renal insufficiency    , with creatinine of 1.3 in 2010, was side effect of med   Obesity    ,moderate   Prostatitis    , Episodic   Stroke (HCC)    Suicide attempt (HCC) 09/20/2014    Allergies Allergies  Allergen Reactions    Allopurinol Other (See Comments)    Made pt emotionally unstable  Other reaction(s): emotionally labile   Penicillins Hives and Other (See Comments)   Finerenone Other (See Comments)    hyperkalemia Other reaction(s): Increases  potassium levels   Penicillin G     Other reaction(s): hives   Aspirin Hives   Ciprofloxacin Rash    Other reaction(s): rash    IV Location/Drains/Wounds Patient Lines/Drains/Airways Status     Active Line/Drains/Airways     Name Placement date Placement time Site Days   Peripheral IV 05/29/23 20 G Right Antecubital 05/29/23  1417  Antecubital  less than 1            Labs/Imaging Results for orders placed or performed during the hospital encounter of 05/29/23 (from the past 48 hour(s))  Lactic acid, plasma     Status: None   Collection Time: 05/29/23 12:22 PM  Result Value Ref Range   Lactic Acid, Venous 1.7 0.5 - 1.9 mmol/L    Comment: Performed at [facility], [address] [place], [place] [address]  Comprehensive metabolic panel     Status: Abnormal   Collection Time: 05/29/23 12:22 PM  Result Value Ref Range   Sodium 137 135 - 145 mmol/L   Potassium 4.3 3.5 - 5.1 mmol/L   Chloride 102 98 - 111  mmol/L   CO2 23 22 - 32 mmol/L   Glucose, Bld 160 (H) 70 - 99 mg/dL    Comment: Glucose reference range applies only to samples taken after fasting for at least 8 hours.   BUN 42 (H) 8 - 23 mg/dL   Creatinine, Ser 1.61 (H) 0.61 - 1.24 mg/dL   Calcium 8.6 (L) 8.9 - 10.3 mg/dL   Total Protein 6.5 6.5 - 8.1 g/dL   Albumin 3.7 3.5 - 5.0 g/dL   AST 19 15 - 41 U/L   ALT 21 0 - 44 U/L   Alkaline Phosphatase 54 38 - 126 U/L   Total Bilirubin 0.4 0.3 - 1.2 mg/dL   GFR, Estimated 40 (L) >60 mL/min    Comment: (NOTE) Calculated using the CKD-EPI Creatinine Equation (2021)    Anion gap 12 5 - 15    Comment: Performed at [facility], [address] [place], [place] [address]  CBC with Differential      Status: Abnormal   Collection Time: 05/29/23 12:22 PM  Result Value Ref Range   WBC 5.3 4.0 - 10.5 K/uL   RBC 3.66 (L) 4.22 - 5.81 MIL/uL   Hemoglobin 11.4 (L) 13.0 - 17.0 g/dL   HCT 54.0 (L) 98.1 - 19.1 %   MCV 95.1 80.0 - 100.0 fL   MCH 31.1 26.0 - 34.0 pg   MCHC 32.8 30.0 - 36.0 g/dL   RDW 47.8 29.5 - 62.1 %   Platelets 184 150 - 400 K/uL   nRBC 0.0 0.0 - 0.2 %   Neutrophils Relative % 69 %   Neutro Abs 3.6 1.7 - 7.7 K/uL   Lymphocytes Relative 15 %   Lymphs Abs 0.8 0.7 - 4.0 K/uL   Monocytes Relative 11 %   Monocytes Absolute 0.6 0.1 - 1.0 K/uL   Eosinophils Relative 4 %   Eosinophils Absolute 0.2 0.0 - 0.5 K/uL   Basophils Relative 1 %   Basophils Absolute 0.0 0.0 - 0.1 K/uL   Immature Granulocytes 0 %   Abs Immature Granulocytes 0.02 0.00 - 0.07 K/uL    Comment: Performed at [facility], [address], [place], [place] [address]   No results [name].  Pending Labs Unresulted Labs (From admission, onward)     Start     Ordered   05/29/23 1211  Lactic acid, plasma  Now then every 2 hours,   R (with STAT occurrences)      05/29/23 1210            Vitals/Pain Today's Vitals   05/29/23 1204 05/29/23 1207  BP: 114/69   Pulse: 79   Resp: 18   Temp: 98.3 F (36.8 C)   TempSrc: Oral   SpO2: 96%   Weight:  104 kg  Height:  5\' 10"  (1.778 m)  PainSc:  1     Isolation Precautions No active isolations  Medications Medications - No data to display  Mobility walks

## 2023-05-30 ENCOUNTER — Inpatient Hospital Stay (HOSPITAL_COMMUNITY): Payer: Medicare Other

## 2023-05-30 ENCOUNTER — Other Ambulatory Visit: Payer: Self-pay | Admitting: Neurology

## 2023-05-30 DIAGNOSIS — E1169 Type 2 diabetes mellitus with other specified complication: Secondary | ICD-10-CM | POA: Diagnosis not present

## 2023-05-30 DIAGNOSIS — R55 Syncope and collapse: Secondary | ICD-10-CM

## 2023-05-30 DIAGNOSIS — R202 Paresthesia of skin: Secondary | ICD-10-CM

## 2023-05-30 DIAGNOSIS — L03116 Cellulitis of left lower limb: Secondary | ICD-10-CM | POA: Diagnosis not present

## 2023-05-30 DIAGNOSIS — E11641 Type 2 diabetes mellitus with hypoglycemia with coma: Secondary | ICD-10-CM

## 2023-05-30 DIAGNOSIS — R001 Bradycardia, unspecified: Secondary | ICD-10-CM | POA: Diagnosis not present

## 2023-05-30 DIAGNOSIS — R400 Somnolence: Secondary | ICD-10-CM | POA: Diagnosis not present

## 2023-05-30 DIAGNOSIS — I10 Essential (primary) hypertension: Secondary | ICD-10-CM

## 2023-05-30 LAB — ECHOCARDIOGRAM COMPLETE
Area-P 1/2: 3.3 cm2
Height: 70 in
S' Lateral: 3.2 cm
Weight: 3770.75 oz

## 2023-05-30 LAB — GLUCOSE, CAPILLARY
Glucose-Capillary: 134 mg/dL — ABNORMAL HIGH (ref 70–99)
Glucose-Capillary: 60 mg/dL — ABNORMAL LOW (ref 70–99)
Glucose-Capillary: 88 mg/dL (ref 70–99)
Glucose-Capillary: 90 mg/dL (ref 70–99)
Glucose-Capillary: 132 mg/dL — ABNORMAL HIGH (ref 70–99)

## 2023-05-30 LAB — BASIC METABOLIC PANEL
Anion gap: 9 (ref 5–15)
BUN: 40 mg/dL — ABNORMAL HIGH (ref 8–23)
CO2: 24 mmol/L (ref 22–32)
Calcium: 8.7 mg/dL — ABNORMAL LOW (ref 8.9–10.3)
Chloride: 104 mmol/L (ref 98–111)
Creatinine, Ser: 1.84 mg/dL — ABNORMAL HIGH (ref 0.61–1.24)
GFR, Estimated: 40 mL/min — ABNORMAL LOW (ref >60)
Glucose, Bld: 119 mg/dL — ABNORMAL HIGH (ref 70–99)
Potassium: 4.3 mmol/L (ref 3.5–5.1)
Sodium: 137 mmol/L (ref 135–145)

## 2023-05-30 LAB — MAGNESIUM: Magnesium: 2.5 mg/dL — ABNORMAL HIGH (ref 1.7–2.4)

## 2023-05-30 LAB — CBC
HCT: 37.3 % — ABNORMAL LOW (ref 39.0–52.0)
Hemoglobin: 12 g/dL — ABNORMAL LOW (ref 13.0–17.0)
MCH: 31.7 pg (ref 26.0–34.0)
MCHC: 32.2 g/dL (ref 30.0–36.0)
MCV: 98.4 fL (ref 80.0–100.0)
Platelets: 180 10*3/uL (ref 150–400)
RBC: 3.79 MIL/uL — ABNORMAL LOW (ref 4.22–5.81)
RDW: 13 % (ref 11.5–15.5)
WBC: 4.7 10*3/uL (ref 4.0–10.5)
nRBC: 0 % (ref 0.0–0.2)

## 2023-05-30 LAB — HIV ANTIBODY (ROUTINE TESTING W REFLEX): HIV Screen 4th Generation wRfx: NONREACTIVE

## 2023-05-30 LAB — TSH: TSH: 1.405 u[IU]/mL (ref 0.350–4.500)

## 2023-05-30 MED ORDER — FUROSEMIDE 10 MG/ML IJ SOLN
20.0000 mg | Freq: Once | INTRAMUSCULAR | Status: AC
  Administered 2023-05-30: 20 mg via INTRAVENOUS
  Filled 2023-05-30: qty 2

## 2023-05-30 MED ORDER — INSULIN ASPART 100 UNIT/ML IJ SOLN: 0.0000 [IU] | Freq: Three times a day (TID) | INTRAMUSCULAR | Status: DC

## 2023-05-30 NOTE — Plan of Care (Signed)
  Problem: Health Behavior/Discharge Planning: Goal: Ability to manage health-related needs will improve Outcome: Progressing   Problem: Clinical Measurements: Goal: Will remain free from infection Outcome: Progressing   Problem: Activity: Goal: Risk for activity intolerance will decrease Outcome: Progressing   Problem: Nutrition: Goal: Adequate nutrition will be maintained Outcome: Progressing   Problem: Coping: Goal: Level of anxiety will decrease Outcome: Progressing   Problem: Pain Managment: Goal: General experience of comfort will improve Outcome: Progressing   

## 2023-05-30 NOTE — Inpatient Diabetes Management (Signed)
Inpatient Diabetes Program Recommendations  AACE/ADA: New Consensus Statement on Inpatient Glycemic Control (2015)  Target Ranges:  Prepandial:   less than 140 mg/dL      Peak postprandial:   less than 180 mg/dL (1-2 hours)      Critically ill patients:  140 - 180 mg/dL   Lab Results  Component Value Date   GLUCAP 88 05/30/2023   HGBA1C 4.9 07/01/2022    Review of Glycemic Control  Latest Reference Range & Units 05/29/23 15:54 05/29/23 21:42 05/30/23 07:10 05/30/23 10:58 05/30/23 11:28  Glucose-Capillary 70 - 99 mg/dL 83 83 010 (H) 60 (L) 88  Diabetes history: DM 2 Outpatient Diabetes medications:  Metformin 500 mg bid Farxiga 10 mg daily Current orders for Inpatient glycemic control:  Novolog 0-20 units tid with meals.   Inpatient Diabetes Program Recommendations:    Consider reducing Novolog correction to sensitive (0-9 units). Patient had low today at lunch after receiving 3 units Novolog for blood sugar of 134 mg/dL (2-72 unit scale).   Thanks  Beryl Meager, RN, BC-ADM Inpatient Diabetes Coordinator Pager [phone]  (8a-5p)

## 2023-05-30 NOTE — Progress Notes (Addendum)
At 10:58 Patient's blood sugar was 58.Followed hypoglycemia protocol.4 oz of orange juice given to the patient.At 11:28 rechecked the Blood sugar.It was 88.MD notified.

## 2023-05-30 NOTE — Plan of Care (Signed)
  Problem: Clinical Measurements: Goal: Will remain free from infection Outcome: Progressing Goal: Diagnostic test results will improve Outcome: Progressing Goal: Respiratory complications will improve Outcome: Progressing Goal: Cardiovascular complication will be avoided Outcome: Progressing   Problem: Activity: Goal: Risk for activity intolerance will decrease Outcome: Progressing   Problem: Nutrition: Goal: Adequate nutrition will be maintained Outcome: Progressing   Problem: Coping: Goal: Level of anxiety will decrease Outcome: Progressing   Problem: Elimination: Goal: Will not experience complications related to bowel motility Outcome: Progressing Goal: Will not experience complications related to urinary retention Outcome: Progressing   

## 2023-05-30 NOTE — Progress Notes (Signed)
[facility]                                                                               Edwin [name], is a 67 y.o. male, DOB - 1956/07/16, MWU:[id number] Admit date - 05/29/2023    Outpatient Primary MD for the patient is Edwin James, MD  LOS - 1  days    Brief summary   66 year old gentleman with prior h/o hypertension, type 2 DM, bipolar disorder, CKD, sleep apnea presents to ED for recurrent cellulitis of the left lower extremity, after failed outpatient treatment with keflex, doxycycline and clindamycin.    Assessment & Plan    Assessment and Plan:  Recurrent left lower extremity cellulitis Failed outpatient treatment with Keflex, doxycycline and clindamycin Continue with IV vancomycin for another 24 hours Pain control and elevate the leg. Start the patient on IV Lasix 20 mg for leg edema.   Bradycardia:  EKG shows bradycardia with 1 degree AV block.  Echo is unremarkable.  Cardiology consulted.   Type 2 diabetes mellitus Uncontrolled with hypoglycemia Continue with sliding scale while inpatient. CBG (last 3)  Recent Labs    05/30/23 0710 05/30/23 1058 05/30/23 1128  GLUCAP 134* 60* 88   OSA Using dental device.  Will need repeat studies and outpatient follow up with pulmonology.    Hypertension;  Well controlled.   Peripheral neuropathy:  Currently on lyrica and stable.    Stage 3b CKD:  Creatinine at baseline.    H/o Bipolar disorder;  Resume home meds.    Estimated body mass index is 33.82 kg/m as calculated from the following:   Height as of this encounter: 5\' 10"  (1.778 m).   Weight as of this encounter: 106.9 kg.  Code Status: full code DVT Prophylaxis:  enoxaparin (LOVENOX) injection 40 mg Start: 05/29/23 1800   Level of Care: Level of care: Progressive Family Communication: none at bedside.   Disposition Plan:     Remains inpatient appropriate:  IV vancomycin.   Procedures:  Echocardiogram.   Consultants:    Cardiology   Antimicrobials:   Anti-infectives (From admission, onward)    Start     Dose/Rate Route Frequency Ordered Stop   05/30/23 0600  vancomycin (VANCOREADY) IVPB 1250 mg/250 mL  Status:  Discontinued        1,250 mg 166.7 mL/hr over 90 Minutes Intravenous Every 24 hours 05/29/23 1645 05/29/23 1650   05/30/23 0600  vancomycin (VANCOCIN) IVPB 1000 mg/200 mL premix        1,000 mg 200 mL/hr over 60 Minutes Intravenous Every 24 hours 05/29/23 1650     05/29/23 1800  vancomycin (VANCOREADY) IVPB 1250 mg/250 mL        1,250 mg 166.7 mL/hr over 90 Minutes Intravenous  Once 05/29/23 1645 05/30/23 1136        Medications  Scheduled Meds:  clopidogrel  75 mg Oral Daily   divalproex  1,000 mg Oral QHS   DULoxetine  120 mg Oral Daily   enoxaparin (LOVENOX) injection  40 mg Subcutaneous Q24H   febuxostat  40 mg Oral Daily   gabapentin  400 mg Oral BID   insulin aspart  0-9 Units Subcutaneous TID WC   lamoTRIgine  200 mg Oral q morning   pantoprazole  40 mg Oral Daily   pramipexole  0.5 mg Oral BID   pregabalin  25 mg Oral BID   tamsulosin  0.4 mg Oral Daily   Continuous Infusions:  vancomycin 1,000 mg (05/30/23 0618)   PRN Meds:.acetaminophen **OR** acetaminophen, melatonin, ondansetron **OR** ondansetron (ZOFRAN) IV, polyethylene glycol    Subjective:   Edwin [name] was seen and examined today.  No more episodes of dozing off.   Objective:   Vitals:   05/29/23 2321 05/30/23 0328 05/30/23 0713 05/30/23 1238  BP: 125/69 136/64 122/81 127/76  Pulse: (!) 55  (!) 51 (!) 46  Resp:  17 17 17   Temp: 97.7 F (36.5 C) 98.1 F (36.7 C) 98.2 F (36.8 C) 98 F (36.7 C)  TempSrc: Oral Oral Oral Oral  SpO2: 96% 99% 96% 100%  Weight:      Height:        Intake/Output Summary (Last 24 hours) at 05/30/2023 1257 Last data filed at 05/30/2023 1102 Gross per 24 hour  Intake 1293.21 ml  Output 650 ml  Net 643.21 ml   Filed Weights   05/29/23 1207 05/29/23 1537   Weight: 104 kg 106.9 kg     Exam General exam: Appears calm and comfortable  Respiratory system: Clear to auscultation. Respiratory effort normal. Cardiovascular system: S1 & S2 heard, RRR. No JVD,  Gastrointestinal system: Abdomen is nondistended, soft and nontender.  Central nervous system: Alert and oriented. No focal neurological deficits. Extremities: left lower extremity cellulitis erythema improving.  Skin: No rashes, lesions or ulcers Psychiatry: Mood & affect appropriate.    Data Reviewed:  I have personally reviewed following labs and imaging studies   CBC Lab Results  Component Value Date   WBC 4.7 05/30/2023   RBC 3.79 (L) 05/30/2023   HGB 12.0 (L) 05/30/2023   HCT 37.3 (L) 05/30/2023   MCV 98.4 05/30/2023   MCH 31.7 05/30/2023   PLT 180 05/30/2023   MCHC 32.2 05/30/2023   RDW 13.0 05/30/2023   LYMPHSABS 0.8 05/29/2023   MONOABS 0.6 05/29/2023   EOSABS 0.2 05/29/2023   BASOSABS 0.0 05/29/2023     Last metabolic panel Lab Results  Component Value Date   NA 137 05/30/2023   K 4.3 05/30/2023   CL 104 05/30/2023   CO2 24 05/30/2023   BUN 40 (H) 05/30/2023   CREATININE 1.84 (H) 05/30/2023   GLUCOSE 119 (H) 05/30/2023   GFRNONAA 40 (L) 05/30/2023   GFRAA >60 02/04/2017   CALCIUM 8.7 (L) 05/30/2023   PROT 6.5 05/29/2023   ALBUMIN 3.7 05/29/2023   BILITOT 0.4 05/29/2023   ALKPHOS 54 05/29/2023   AST 19 05/29/2023   ALT 21 05/29/2023   ANIONGAP 9 05/30/2023    CBG (last 3)  Recent Labs    05/30/23 0710 05/30/23 1058 05/30/23 1128  GLUCAP 134* 60* 88      Coagulation Profile: No results for input(s): "INR", "PROTIME" in the last 168 hours.   Radiology Studies: ECHOCARDIOGRAM COMPLETE  Result Date: 05/30/2023    ECHOCARDIOGRAM REPORT   Patient Name:   Edwin [name] Date of Exam: 05/30/2023 Medical Rec #:  [id number]       Height:       70.0 in Accession #:    [id number]      Weight:       235.7 lb Date of Birth:  [birth year]  BSA:           2.238 m Patient Age:    67 years        BP:           122/81 mmHg Patient Gender: M               HR:           49 bpm. Exam Location:  Inpatient Procedure: 2D Echo, Color Doppler and Cardiac Doppler Indications:    Syncope  History:        Patient has prior history of Echocardiogram examinations, most                 recent 07/01/2022. Stroke and Cellulitis. CKD,                 Signs/Symptoms:Syncope; Risk Factors:Sleep Apnea, Diabetes,                 Hypertension and Dyslipidemia.  Sonographer:    Milbert Coulter Referring Phys: [id number] KATY L FOUST  Sonographer Comments: OSA and Image acquisition challenging due to patient body habitus. IMPRESSIONS  1. Left ventricular ejection fraction, by estimation, is 60 to 65%. The left ventricle has normal function. The left ventricle has no regional wall motion abnormalities. Left ventricular diastolic parameters are consistent with Grade I diastolic dysfunction (impaired relaxation).  2. Right ventricular systolic function is normal. The right ventricular size is mildly enlarged. Tricuspid regurgitation signal is inadequate for assessing PA pressure.  3. The mitral valve is normal in structure. Trivial mitral valve regurgitation. No evidence of mitral stenosis.  4. The aortic valve is tricuspid. Aortic valve regurgitation is not visualized. Aortic valve sclerosis is present, with no evidence of aortic valve stenosis.  5. Aortic dilatation noted. There is borderline dilatation of the ascending aorta, measuring 38 mm.  6. The inferior vena cava is normal in size with greater than 50% respiratory variability, suggesting right atrial pressure of 3 mmHg. Comparison(s): No significant change from prior study. FINDINGS  Left Ventricle: Left ventricular ejection fraction, by estimation, is 60 to 65%. The left ventricle has normal function. The left ventricle has no regional wall motion abnormalities. The left ventricular internal cavity size was normal in size. There is  no  left ventricular hypertrophy. Left ventricular diastolic parameters are consistent with Grade I diastolic dysfunction (impaired relaxation). Right Ventricle: The right ventricular size is mildly enlarged. No increase in right ventricular wall thickness. Right ventricular systolic function is normal. Tricuspid regurgitation signal is inadequate for assessing PA pressure. Left Atrium: Left atrial size was normal in size. Right Atrium: Right atrial size was normal in size. Pericardium: There is no evidence of pericardial effusion. Mitral Valve: The mitral valve is normal in structure. Trivial mitral valve regurgitation. No evidence of mitral valve stenosis. Tricuspid Valve: The tricuspid valve is normal in structure. Tricuspid valve regurgitation is trivial. Aortic Valve: The aortic valve is tricuspid. Aortic valve regurgitation is not visualized. Aortic valve sclerosis is present, with no evidence of aortic valve stenosis. Pulmonic Valve: The pulmonic valve was normal in structure. Pulmonic valve regurgitation is trivial. Aorta: Aortic dilatation noted. There is borderline dilatation of the ascending aorta, measuring 38 mm. Venous: The inferior vena cava is normal in size with greater than 50% respiratory variability, suggesting right atrial pressure of 3 mmHg. IAS/Shunts: The atrial septum is grossly normal.  LEFT VENTRICLE PLAX 2D LVIDd:         5.00 cm   Diastology LVIDs:  3.20 cm   LV e' medial:    8.05 cm/s LV PW:         1.00 cm   LV E/e' medial:  7.7 LV IVS:        1.00 cm   LV e' lateral:   11.90 cm/s LVOT diam:     2.00 cm   LV E/e' lateral: 5.2 LV SV:         66 LV SV Index:   29 LVOT Area:     3.14 cm  RIGHT VENTRICLE RV Basal diam:  4.00 cm RV Mid diam:    3.30 cm RV S prime:     16.80 cm/s TAPSE (M-mode): 3.2 cm LEFT ATRIUM             Index        RIGHT ATRIUM           Index LA diam:        3.50 cm 1.56 cm/m   RA Area:     20.80 cm LA Vol (A2C):   57.9 ml 25.85 ml/m  RA Volume:   64.20 ml   28.68 ml/m LA Vol (A4C):   44.9 ml 20.06 ml/m LA Biplane Vol: 54.9 ml 24.53 ml/m  AORTIC VALVE LVOT Vmax:   88.70 cm/s LVOT Vmean:  53.600 cm/s LVOT VTI:    0.209 m  AORTA Ao Root diam: 3.80 cm Ao Asc diam:  3.80 cm MITRAL VALVE MV Area (PHT): 3.30 cm    SHUNTS MV Decel Time: 230 msec    Systemic VTI:  0.21 m MV E velocity: 62.10 cm/s  Systemic Diam: 2.00 cm MV A velocity: 67.70 cm/s MV E/A ratio:  0.92 Laurance Flatten MD Electronically signed by Laurance Flatten MD Signature Date/Time: 05/30/2023/12:08:49 PM    Final        Kathlen Mody M.D. [facility] 05/30/2023, 12:57 PM  Available via Epic secure chat 7am-7pm After 7 pm, please refer to night coverage provider listed on amion.

## 2023-05-30 NOTE — Telephone Encounter (Signed)
Requested Prescriptions   Pending Prescriptions Disp Refills   pregabalin (LYRICA) 25 MG capsule [Pharmacy Med Name: pregabalin 25 mg capsule] 30 capsule 2    Sig: TAKE ONE CAPSULE BY MOUTH TWICE DAILY   Last seen 05/02/23, next appt scheduled 09/03/23  Dispenses  Taking gabapentin Dispensed Days Supply Quantity Provider Pharmacy  pregabalin 25 mg capsule 05/28/2023 12 24 each Micki Riley, MD [facility] - [facility]...  pregabalin 25 mg capsule 05/14/2023 15 30 each Micki Riley, MD [facility] - G...  pregabalin 50 mg capsule 05/08/2023 28 56 each Micki Riley, MD [facility] - G...    Routing to work in provider to make sure ok to refuse as he is taking gabapentin and I do not think that you can take both at the same time

## 2023-05-30 NOTE — Consult Note (Addendum)
Cardiology Consultation   Patient ID: Edwin [name] MRN: [id number]; DOB: Mar 19, 1956  Admit date: 05/29/2023 Date of Consult: 05/30/2023  PCP:  Deatra James, MD   [facility] Providers Cardiologist:  Donato Schultz, MD   {  Patient Profile:   Edwin [name] is a 67 y.o. male with a hx of A-V dissociation, hypertension, CVA 2023, type 2 diabetes mellitus, CKD, OSA, bipolar, who is being seen 05/30/2023 for the evaluation of symptomatic bradycardia at the request of Dr. Blake Divine.  History of Present Illness:   Mr. Gains is followed by Dr. Anne Fu for A-V dissociation since 2015 He was admitted in 2012 due to significant bradycardia, A-V dissociation, ventricular ectopy/accelerated idioventricular rhythm. Cardiac catheterization during that admission was done to exclude coronary disease.  No significant findings from this, so felt to be secondary to lithium.  He was seen outpatient and felt improvement in symptoms after discontinuing lithium and diltiazem.  He was switched to Depakote instead.  Patient was last seen in February 2024 without any complaints and reported to be feeling very well.  Currently patient is being evaluated for recurrent cellulitis of the left lower extremity after failing outpatient therapy.  In addition after arriving to the MedSurg unit patient was noted to be falling asleep while staff were talking to him.  EKG was obtained that showed sinus bradycardia with first-degree AV block.  EKG was reviewed by Dr. Allyson Sabal with no concerns. 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 Episodes of somnolence lasting between 1-5 min.   After discussing with patient appears that the symptoms are not new.  Patient has had somnolence and been dozing on and off for the past 7+ years.  He did not tell anybody about the symptoms because he felt that it was not significant.  In addition, patient states that he  has had recent addition of Lyrica.  Patient states that these episodes occur at rest or when sitting or eating.  They are progressive and feels the drowsiness coming on.  In addition, patient has history of sleep apnea and often wakes up frequently through the night, per wife.  He denies any syncopal episodes, chest pain, palpitations, shortness of breath, or muscle weakness, visual disturbances.  Negative Dopplers of the left lower extremity.  Negative lactic acid.  No leukocytosis.   Past Medical History:  Diagnosis Date   Allergic rhinitis    Bipolar disorder (HCC)    Bradycardia 2012    due to lithium   CKD (chronic kidney disease) stage 2, GFR 60-89 ml/min    Gout    Hypertension    Mild renal insufficiency    , with creatinine of 1.3 in 2010, was side effect of med   Obesity    ,moderate   Prostatitis    , Episodic   Stroke [facility])    Suicide attempt (HCC) 09/20/2014    Past Surgical History:  Procedure Laterality Date   APPENDECTOMY     CATARACT EXTRACTION Right    COLONOSCOPY WITH PROPOFOL N/A 06/14/2014   Procedure: COLONOSCOPY WITH PROPOFOL;  Surgeon: Charolett Bumpers, MD;  Location: [facility] END[facility];  Service: Endoscopy;  Laterality: N/A;   TONSILLECTOMY     UMBILICAL HERNIA REPAIR      Inpatient Medications: Scheduled Meds:  clopidogrel  75 mg Oral Daily   divalproex  1,000 mg Oral QHS   DULoxetine  120 mg Oral  Daily   enoxaparin (LOVENOX) injection  40 mg Subcutaneous Q24H   febuxostat  40 mg Oral Daily   gabapentin  400 mg Oral BID   insulin aspart  0-9 Units Subcutaneous TID WC   lamoTRIgine  200 mg Oral q morning   pantoprazole  40 mg Oral Daily   pramipexole  0.5 mg Oral BID   pregabalin  25 mg Oral BID   tamsulosin  0.4 mg Oral Daily   Continuous Infusions:  vancomycin 1,000 mg (05/30/23 0618)   PRN Meds: acetaminophen **OR** acetaminophen, melatonin, ondansetron **OR** ondansetron (ZOFRAN) IV, polyethylene glycol  Allergies:    Allergies  Allergen  Reactions   Allopurinol Other (See Comments)    Made pt emotionally unstable  Other reaction(s): emotionally labile   Penicillins Hives and Other (See Comments)   Finerenone Other (See Comments)    hyperkalemia Other reaction(s): Increases  potassium levels   Penicillin G     Other reaction(s): hives   Aspirin Hives   Ciprofloxacin Rash    Other reaction(s): rash    Social History:   Social History   Socioeconomic History   Marital status: Married    Spouse name: Sweet   Number of children: 0   Years of education: Not on file   Highest education level: Master's degree (e.g., MA, MS, MEng, MEd, MSW, MBA)  Occupational History   Not on file  Tobacco Use   Smoking status: Never    Passive exposure: Never   Smokeless tobacco: Never  Substance and Sexual Activity   Alcohol use: No   Drug use: No   Sexual activity: Not on file  Other Topics Concern   Not on file  Social History Narrative   Lives alone   Left handed   Caffeine: 2 ice tea a day   Social Determinants of Health   Financial Resource Strain: Low Risk  (02/22/2022)   Overall Financial Resource Strain (CARDIA)    Difficulty of Paying Living Expenses: Not hard at all  Food Insecurity: No Food Insecurity (05/29/2023)   Hunger Vital Sign    Worried About Running Out of Food in the Last Year: Never true    Ran Out of Food in the Last Year: Never true  Transportation Needs: No Transportation Needs (05/29/2023)   PRAPARE - Administrator, Civil Service (Medical): No    Lack of Transportation (Non-Medical): No  Physical Activity: Not on file  Stress: No Stress Concern Present (02/22/2022)   [employer] of Occupational Health - Occupational Stress Questionnaire    Feeling of Stress : Only a little  Social Connections: Unknown (04/03/2022)   Received from [facility]   Social Network    Social Network: Not on file  Intimate Partner Violence: Not At Risk (05/29/2023)   Humiliation, Afraid, Rape, and  Kick questionnaire    Fear of Current or Ex-Partner: No    Emotionally Abused: No    Physically Abused: No    Sexually Abused: No    Family History:   Family History  Problem Relation Age of Onset   Other Mother        Viral Meningitis   Mitral valve prolapse Mother    Arrhythmia Mother    Hypothyroidism Mother    Stroke Mother    Hypertension Father    Gout Father    Alzheimer's disease Father      ROS:  Please see the history of present illness.  All other ROS reviewed and negative.  Physical Exam/Data:   Vitals:   05/29/23 2321 05/30/23 0328 05/30/23 0713 05/30/23 1238  BP: 125/69 136/64 122/81 127/76  Pulse: (!) 55  (!) 51 (!) 46  Resp:  17 17 17   Temp: 97.7 F (36.5 C) 98.1 F (36.7 C) 98.2 F (36.8 C) 98 F (36.7 C)  TempSrc: Oral Oral Oral Oral  SpO2: 96% 99% 96% 100%  Weight:      Height:        Intake/Output Summary (Last 24 hours) at 05/30/2023 1259 Last data filed at 05/30/2023 1102 Gross per 24 hour  Intake 1293.21 ml  Output 650 ml  Net 643.21 ml      05/29/2023    3:37 PM 05/29/2023   12:07 PM 05/22/2023   10:43 PM  Last 3 Weights  Weight (lbs) 235 lb 10.8 oz 229 lb 4.5 oz 230 lb  Weight (kg) 106.9 kg 104 kg 104.327 kg     Body mass index is 33.82 kg/m.  General:  Well nourished, well developed, in no acute distress HEENT: normal Neck: no JVD Vascular: No carotid bruits; Distal pulses 2+ bilaterally Cardiac:  normal S1, S2; RRR; no murmur.  Bradycardic Lungs:  clear to auscultation bilaterally, no wheezing, rhonchi or rales  Abd: soft, nontender, no hepatomegaly  Ext: no edema Musculoskeletal:  No deformities, BUE and BLE strength normal and equal Skin: warm and dry  Neuro:  CNs 2-12 intact, no focal abnormalities noted Psych:  Normal affect   EKG:  The EKG was personally reviewed and demonstrates:  Sinus bradycardia, heart rate 49.  First-degree AV block.  PR interval 216.  Incomplete bundle branch block. Telemetry:  Telemetry was  personally reviewed and demonstrates: Sinus bradycardia heart rate 40-60  Relevant CV Studies: Echo 07/01/2022 1. Left ventricular ejection fraction, by estimation, is 55 to 60%. The  left ventricle has normal function. The left ventricle has no regional  wall motion abnormalities. Left ventricular diastolic parameters are  consistent with Grade I diastolic  dysfunction (impaired relaxation).   2. Right ventricular systolic function is normal. The right ventricular  size is mildly enlarged. There is normal pulmonary artery systolic  pressure. The estimated right ventricular systolic pressure is 14.7 mmHg.   3. Left atrial size was mildly dilated.   4. The mitral valve is grossly normal. Trivial mitral valve  regurgitation. No evidence of mitral stenosis.   5. The aortic valve is tricuspid. Aortic valve regurgitation is not  visualized. No aortic stenosis is present.   6. The inferior vena cava is normal in size with greater than 50%  respiratory variability, suggesting right atrial pressure of 3 mmHg.   Comparison(s): No significant change from prior study.   Conclusion(s)/Recommendation(s): No intracardiac source of embolism  detected on this transthoracic study. Consider a transesophageal  echocardiogram to exclude cardiac source of embolism if clinically  indicated.   Laboratory Data:  High Sensitivity Troponin:  No results for input(s): "TROPONINIHS" in the last 720 hours.   Chemistry Recent Labs  Lab 05/29/23 1222 05/30/23 0425 05/30/23 0427  NA 137  --  137  K 4.3  --  4.3  CL 102  --  104  CO2 23  --  24  GLUCOSE 160*  --  119*  BUN 42*  --  40*  CREATININE 1.86*  --  1.84*  CALCIUM 8.6*  --  8.7*  MG 2.4 2.5*  --   GFRNONAA 40*  --  40*  ANIONGAP 12  --  9  Recent Labs  Lab 05/29/23 1222  PROT 6.5  ALBUMIN 3.7  AST 19  ALT 21  ALKPHOS 54  BILITOT 0.4   Lipids No results for input(s): "CHOL", "TRIG", "HDL", "LABVLDL", "LDLCALC", "CHOLHDL" in the last  168 hours.  Hematology Recent Labs  Lab 05/29/23 1222 05/30/23 0427  WBC 5.3 4.7  RBC 3.66* 3.79*  HGB 11.4* 12.0*  HCT 34.8* 37.3*  MCV 95.1 98.4  MCH 31.1 31.7  MCHC 32.8 32.2  RDW 13.0 13.0  PLT 184 180   Thyroid  Recent Labs  Lab 05/30/23 0427  TSH 1.405    BNPNo results for input(s): "BNP", "PROBNP" in the last 168 hours.  DDimer No results for input(s): "DDIMER" in the last 168 hours.   Radiology/Studies:  ECHOCARDIOGRAM COMPLETE  Result Date: 05/30/2023    ECHOCARDIOGRAM REPORT   Patient Name:   Edwin [name] Date of Exam: 05/30/2023 Medical Rec #:  [id number]       Height:       70.0 in Accession #:    [id number]      Weight:       235.7 lb Date of Birth:  1956-11-11        BSA:          2.238 m Patient Age:    67 years        BP:           122/81 mmHg Patient Gender: M               HR:           49 bpm. Exam Location:  Inpatient Procedure: 2D Echo, Color Doppler and Cardiac Doppler Indications:    Syncope  History:        Patient has prior history of Echocardiogram examinations, most                 recent 07/01/2022. Stroke and Cellulitis. CKD,                 Signs/Symptoms:Syncope; Risk Factors:Sleep Apnea, Diabetes,                 Hypertension and Dyslipidemia.  Sonographer:    Milbert Coulter Referring Phys: [id number] KATY L FOUST  Sonographer Comments: OSA and Image acquisition challenging due to patient body habitus. IMPRESSIONS  1. Left ventricular ejection fraction, by estimation, is 60 to 65%. The left ventricle has normal function. The left ventricle has no regional wall motion abnormalities. Left ventricular diastolic parameters are consistent with Grade I diastolic dysfunction (impaired relaxation).  2. Right ventricular systolic function is normal. The right ventricular size is mildly enlarged. Tricuspid regurgitation signal is inadequate for assessing PA pressure.  3. The mitral valve is normal in structure. Trivial mitral valve regurgitation. No evidence of mitral  stenosis.  4. The aortic valve is tricuspid. Aortic valve regurgitation is not visualized. Aortic valve sclerosis is present, with no evidence of aortic valve stenosis.  5. Aortic dilatation noted. There is borderline dilatation of the ascending aorta, measuring 38 mm.  6. The inferior vena cava is normal in size with greater than 50% respiratory variability, suggesting right atrial pressure of 3 mmHg. Comparison(s): No significant change from prior study. FINDINGS  Left Ventricle: Left ventricular ejection fraction, by estimation, is 60 to 65%. The left ventricle has normal function. The left ventricle has no regional wall motion abnormalities. The left ventricular internal cavity size was normal in size. There is  no left ventricular hypertrophy.  Left ventricular diastolic parameters are consistent with Grade I diastolic dysfunction (impaired relaxation). Right Ventricle: The right ventricular size is mildly enlarged. No increase in right ventricular wall thickness. Right ventricular systolic function is normal. Tricuspid regurgitation signal is inadequate for assessing PA pressure. Left Atrium: Left atrial size was normal in size. Right Atrium: Right atrial size was normal in size. Pericardium: There is no evidence of pericardial effusion. Mitral Valve: The mitral valve is normal in structure. Trivial mitral valve regurgitation. No evidence of mitral valve stenosis. Tricuspid Valve: The tricuspid valve is normal in structure. Tricuspid valve regurgitation is trivial. Aortic Valve: The aortic valve is tricuspid. Aortic valve regurgitation is not visualized. Aortic valve sclerosis is present, with no evidence of aortic valve stenosis. Pulmonic Valve: The pulmonic valve was normal in structure. Pulmonic valve regurgitation is trivial. Aorta: Aortic dilatation noted. There is borderline dilatation of the ascending aorta, measuring 38 mm. Venous: The inferior vena cava is normal in size with greater than 50%  respiratory variability, suggesting right atrial pressure of 3 mmHg. IAS/Shunts: The atrial septum is grossly normal.  LEFT VENTRICLE PLAX 2D LVIDd:         5.00 cm   Diastology LVIDs:         3.20 cm   LV e' medial:    8.05 cm/s LV PW:         1.00 cm   LV E/e' medial:  7.7 LV IVS:        1.00 cm   LV e' lateral:   11.90 cm/s LVOT diam:     2.00 cm   LV E/e' lateral: 5.2 LV SV:         66 LV SV Index:   29 LVOT Area:     3.14 cm  RIGHT VENTRICLE RV Basal diam:  4.00 cm RV Mid diam:    3.30 cm RV S prime:     16.80 cm/s TAPSE (M-mode): 3.2 cm LEFT ATRIUM             Index        RIGHT ATRIUM           Index LA diam:        3.50 cm 1.56 cm/m   RA Area:     20.80 cm LA Vol (A2C):   57.9 ml 25.85 ml/m  RA Volume:   64.20 ml  28.68 ml/m LA Vol (A4C):   44.9 ml 20.06 ml/m LA Biplane Vol: 54.9 ml 24.53 ml/m  AORTIC VALVE LVOT Vmax:   88.70 cm/s LVOT Vmean:  53.600 cm/s LVOT VTI:    0.209 m  AORTA Ao Root diam: 3.80 cm Ao Asc diam:  3.80 cm MITRAL VALVE MV Area (PHT): 3.30 cm    SHUNTS MV Decel Time: 230 msec    Systemic VTI:  0.21 m MV E velocity: 62.10 cm/s  Systemic Diam: 2.00 cm MV A velocity: 67.70 cm/s MV E/A ratio:  0.92 Laurance Flatten MD Electronically signed by Laurance Flatten MD Signature Date/Time: 05/30/2023/12:08:49 PM    Final      Assessment and Plan:   Symptomatic bradycardia 1s degree AV block Patient with previous history of bradycardia/A-V dissociation felt secondary to lithium based off negative findings off cardiac catheterization in 2012.  Has heart rates that are between 40-60.  EKG shows first-degree AV block.  No other arrhythmias or pauses noted on telemetry. Echo normal. Previous echo 10/2022 was normal.  His current symptoms have been chronic and been going on  for the last 7+ years.  Patient on multiple medications that depress the CNS system that are likely to contribute to current symptoms.  (Depakote, Lamictal, gabapentin, newly added Lyrica).  Also has history of OSA.  New addition of Lamictal might be contributing to bradycardia. I suspect this is multifactorial and would recommend psychiatric evaluation to reconcile medications given this theres a high suspicion for polypharmacy/OSA Can consider cardiac monitor, defer to MD.   Hypertension Resume when stable.   Left lower leg cellulitis Type 2 diabetes OSA on dental appliance at home, but wife still noting frequent apneic spells. Refuses CPAP. CKD stage III Bipolar CVA 2023 Per primary   Risk Assessment/Risk Scores:    For questions or updates, please contact [facility] Please consult [email or web address] for contact info under    Signed, Abagail Kitchens, PA-C  05/30/2023 12:59 PM

## 2023-05-31 DIAGNOSIS — E1169 Type 2 diabetes mellitus with other specified complication: Secondary | ICD-10-CM

## 2023-05-31 DIAGNOSIS — F319 Bipolar disorder, unspecified: Secondary | ICD-10-CM | POA: Diagnosis not present

## 2023-05-31 DIAGNOSIS — L03116 Cellulitis of left lower limb: Secondary | ICD-10-CM | POA: Diagnosis not present

## 2023-05-31 LAB — HEMOGLOBIN A1C
Hgb A1c MFr Bld: 5.3 % (ref 4.8–5.6)
Mean Plasma Glucose: 105 mg/dL

## 2023-05-31 LAB — CREATININE, SERUM
Creatinine, Ser: 2.08 mg/dL — ABNORMAL HIGH (ref 0.61–1.24)
GFR, Estimated: 35 mL/min — ABNORMAL LOW (ref >60)

## 2023-05-31 LAB — GLUCOSE, CAPILLARY
Glucose-Capillary: 118 mg/dL — ABNORMAL HIGH (ref 70–99)
Glucose-Capillary: 94 mg/dL (ref 70–99)

## 2023-05-31 LAB — BASIC METABOLIC PANEL
Anion gap: 13 (ref 5–15)
BUN: 40 mg/dL — ABNORMAL HIGH (ref 8–23)
CO2: 22 mmol/L (ref 22–32)
Calcium: 8.8 mg/dL — ABNORMAL LOW (ref 8.9–10.3)
Chloride: 104 mmol/L (ref 98–111)
Creatinine, Ser: 2.09 mg/dL — ABNORMAL HIGH (ref 0.61–1.24)
GFR, Estimated: 34 mL/min — ABNORMAL LOW (ref >60)
Glucose, Bld: 99 mg/dL (ref 70–99)
Potassium: 4.2 mmol/L (ref 3.5–5.1)
Sodium: 139 mmol/L (ref 135–145)

## 2023-05-31 MED ORDER — DOXYCYCLINE HYCLATE 50 MG PO CAPS: 100.0000 mg | ORAL_CAPSULE | Freq: Two times a day (BID) | ORAL | 0 refills | Status: AC

## 2023-05-31 NOTE — Progress Notes (Signed)
Transition of Care [facility]) - Inpatient Brief Assessment   Patient Details  Name: Edwin [name] MRN: [id number] Date of Birth: 1955-12-01  Transition of Care [facility]) CM/SW Contact:    Larrie Kass, LCSW Phone Number: 05/31/2023, 1:55 PM   Clinical Narrative:  Transition of Care Department [facility]) has reviewed patient and no TOC needs have been identified at this time. We will continue to monitor patient advancement through interdisciplinary progression rounds. If new patient transition needs arise, please place a TOC consult.  Transition of Care Asessment: Insurance and Status: Insurance coverage has been reviewed Patient has primary care physician: Yes Home environment has been reviewed: yes   Prior/Current Home Services: No current home services Social Determinants of Health Reivew: SDOH reviewed no interventions necessary Readmission risk has been reviewed: Yes Transition of care needs: no transition of care needs at this time

## 2023-05-31 NOTE — Plan of Care (Signed)
  Problem: Clinical Measurements: Goal: Respiratory complications will improve Outcome: Progressing Goal: Cardiovascular complication will be avoided Outcome: Progressing   Problem: Activity: Goal: Risk for activity intolerance will decrease Outcome: Progressing   Problem: Nutrition: Goal: Adequate nutrition will be maintained Outcome: Progressing   Problem: Coping: Goal: Level of anxiety will decrease Outcome: Progressing   Problem: Elimination: Goal: Will not experience complications related to urinary retention Outcome: Progressing   Problem: Pain Managment: Goal: General experience of comfort will improve Outcome: Progressing   

## 2023-05-31 NOTE — Discharge Summary (Signed)
Physician Discharge Summary   Patient: Edwin [name] MRN: [id number] DOB: May 16, 1956  Admit date:     05/29/2023  Discharge date: 05/31/2023  Discharge Physician: Kathlen Mody   PCP: Deatra James, MD   Recommendations at discharge:  Please follow up with PCP in one week.  Please follow up with nephrology in 2 weeks.  We have held the metformin and losartan due to slight worsening of renal parameters.  Please check BMP in one week.    Discharge Diagnoses: Principal Problem:   Cellulitis Active Problems:   Somnolence, daytime   Hospital Course: 67 year old gentleman with prior h/o hypertension, type 2 DM, bipolar disorder, CKD, sleep apnea presents to ED for recurrent cellulitis of the left lower extremity, after failed outpatient treatment with keflex, doxycycline and clindamycin.   Assessment and Plan:    Recurrent left lower extremity cellulitis Failed outpatient treatment with Keflex, doxycycline and clindamycin Completed 3 days of IV vancomycin with significant improvement in cellulitis.  Pain control and elevate the leg. Discharged on oral antibiotics to complete the course.       Bradycardia:  EKG shows bradycardia with 1 degree AV block.  Echo is unremarkable.  Cardiology consulted recommendations given.  The dozing off episodes probably not near syncope, probably sec to medications vs sleep apnea.    Type 2 diabetes mellitus Uncontrolled with hypoglycemia. CBG (last 3)  Recent Labs    05/30/23 2053 05/31/23 0726 05/31/23 1111  GLUCAP 132* 118* 94   We have stopped metformin due to creatinine at 2.    OSA Using dental device.  Will need repeat studies and outpatient follow up with pulmonology.      Hypertension;  Well controlled.    Peripheral neuropathy:  Stable on lyrica and neurontin.     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  Vancomycin and lasix discontinued ,  recommended to stay overnight and recheck renal parameters in am, but patient wanted to go home and followup with nephrology.  Discontinued losartan and metformin on discharge.  Recommended to continue with lasix as needed for edema.      H/o Bipolar disorder;  Resume home meds.      Estimated body mass index is 33.82 kg/m as calculated from the following:   Height as of this encounter: 5\' 10"  (1.778 m).   Weight as of this encounter: 106.9 kg.     Consultants: cardiology.  Procedures performed: echo Disposition: Home Diet recommendation:  Discharge Diet Orders (From admission, onward)     Start     Ordered   05/31/23 0000  Diet - low sodium heart healthy        05/31/23 1403           Carb modified diet DISCHARGE MEDICATION: Allergies as of 05/31/2023       Reactions   Allopurinol Other (See Comments)   Made pt emotionally unstable  Other reaction(s): emotionally labile   Penicillins Hives, Other (See Comments)   Finerenone Other (See Comments)   hyperkalemia Other reaction(s): Increases  potassium levels   Penicillin G    Other reaction(s): hives   Aspirin Hives   Ciprofloxacin Rash   Other reaction(s): rash        Medication List     STOP taking these medications    cephALEXin 500 MG capsule Commonly known as: KEFLEX   clindamycin 300 MG capsule Commonly known as: CLEOCIN  doxycycline 100 MG tablet Commonly known as: VIBRA-TABS Replaced by: doxycycline 50 MG capsule   hydrALAZINE 50 MG tablet Commonly known as: APRESOLINE   losartan 100 MG tablet Commonly known as: COZAAR   metFORMIN 500 MG 24 hr tablet Commonly known as: GLUCOPHAGE-XR       TAKE these medications    amLODipine 5 MG tablet Commonly known as: NORVASC Take 5 mg by mouth daily.   Cholecalciferol 25 MCG (1000 UT) Tbdp Take 1 tablet by mouth daily.   clopidogrel 75 MG tablet Commonly known as: PLAVIX TAKE ONE TABLET BY MOUTH DAILY   dapagliflozin propanediol 10  MG Tabs tablet Commonly known as: FARXIGA Take 10 mg by mouth daily.   divalproex 500 MG 24 hr tablet Commonly known as: DEPAKOTE ER Take 2 tablets (1,000 mg total) by mouth at bedtime.   doxycycline 50 MG capsule Commonly known as: VIBRAMYCIN Take 2 capsules (100 mg total) by mouth 2 (two) times daily for 4 days. Replaces: doxycycline 100 MG tablet   DULoxetine 60 MG capsule Commonly known as: CYMBALTA Take 2 capsules (120 mg total) by mouth daily.   febuxostat 40 MG tablet Commonly known as: ULORIC Take 40 mg by mouth daily.   furosemide 20 MG tablet Commonly known as: LASIX Take 20 mg by mouth daily as needed for edema. May take 2 to 3 days weekly   gabapentin 400 MG capsule Commonly known as: NEURONTIN Take 400 mg by mouth 2 (two) times daily.   Kerendia 10 MG Tabs Generic drug: Finerenone Take 1 tablet by mouth daily.   L-Carnitine 500 MG Tabs Take 500 mg by mouth 2 (two) times daily.   lamoTRIgine 200 MG tablet Commonly known as: LAMICTAL TAKE ONE TABLET BY MOUTH IN THE MORNING   multivitamin-lutein Caps capsule Take 1 capsule by mouth daily.   pramipexole 0.5 MG tablet Commonly known as: MIRAPEX Take 1 tablet (0.5 mg total) by mouth 2 (two) times daily.   pregabalin 25 MG capsule Commonly known as: LYRICA TAKE ONE CAPSULE BY MOUTH TWICE DAILY   tamsulosin 0.4 MG Caps capsule Commonly known as: FLOMAX Take 0.4 mg by mouth daily.   Veltassa 8.4 g packet Generic drug: patiromer Take 8.4 g by mouth See admin instructions. Take one packet by mouth three times a week. Days vary.        Follow-up Information     Sharlene Dory, PA-C Follow up.   Specialty: Cardiology Why: Monday Jun 17, 2023 Arrive by 8:10 AM Appt at 8:25 AM (25 min) Contact information: [address] [place] [address] [phone]                Discharge Exam: Ceasar Mons Weights   05/29/23 1207 05/29/23 1537  Weight: 104 kg 106.9 kg   General exam:  Appears calm and comfortable  Respiratory system: Clear to auscultation. Respiratory effort normal. Cardiovascular system: S1 & S2 heard, RRR. No JVD, murmurs, rubs, gallops or clicks. No pedal edema. Gastrointestinal system: Abdomen is nondistended, soft and nontender. No organomegaly or masses felt. Normal bowel sounds heard. Central nervous system: Alert and oriented.  Extremities: left lower extremity cellulitis improving.  Skin: erythema has resolved.  Psychiatry: Mood & affect appropriate.    Condition at discharge: fair  The results of significant diagnostics from this hospitalization (including imaging, microbiology, ancillary and laboratory) are listed below for reference.   Imaging Studies: ECHOCARDIOGRAM COMPLETE  Result Date: 05/30/2023    ECHOCARDIOGRAM REPORT   Patient  Name:   Edwin [name] Date of Exam: 05/30/2023 Medical Rec #:  [id number]       Height:       70.0 in Accession #:    [id number]      Weight:       235.7 lb Date of Birth:  30-Sep-1956        BSA:          2.238 m Patient Age:    67 years        BP:           122/81 mmHg Patient Gender: M               HR:           49 bpm. Exam Location:  Inpatient Procedure: 2D Echo, Color Doppler and Cardiac Doppler Indications:    Syncope  History:        Patient has prior history of Echocardiogram examinations, most                 recent 07/01/2022. Stroke and Cellulitis. CKD,                 Signs/Symptoms:Syncope; Risk Factors:Sleep Apnea, Diabetes,                 Hypertension and Dyslipidemia.  Sonographer:    Milbert Coulter Referring Phys: [id number] KATY L FOUST  Sonographer Comments: OSA and Image acquisition challenging due to patient body habitus. IMPRESSIONS  1. Left ventricular ejection fraction, by estimation, is 60 to 65%. The left ventricle has normal function. The left ventricle has no regional wall motion abnormalities. Left ventricular diastolic parameters are consistent with Grade I diastolic dysfunction (impaired  relaxation).  2. Right ventricular systolic function is normal. The right ventricular size is mildly enlarged. Tricuspid regurgitation signal is inadequate for assessing PA pressure.  3. The mitral valve is normal in structure. Trivial mitral valve regurgitation. No evidence of mitral stenosis.  4. The aortic valve is tricuspid. Aortic valve regurgitation is not visualized. Aortic valve sclerosis is present, with no evidence of aortic valve stenosis.  5. Aortic dilatation noted. There is borderline dilatation of the ascending aorta, measuring 38 mm.  6. The inferior vena cava is normal in size with greater than 50% respiratory variability, suggesting right atrial pressure of 3 mmHg. Comparison(s): No significant change from prior study. FINDINGS  Left Ventricle: Left ventricular ejection fraction, by estimation, is 60 to 65%. The left ventricle has normal function. The left ventricle has no regional wall motion abnormalities. The left ventricular internal cavity size was normal in size. There is  no left ventricular hypertrophy. Left ventricular diastolic parameters are consistent with Grade I diastolic dysfunction (impaired relaxation). Right Ventricle: The right ventricular size is mildly enlarged. No increase in right ventricular wall thickness. Right ventricular systolic function is normal. Tricuspid regurgitation signal is inadequate for assessing PA pressure. Left Atrium: Left atrial size was normal in size. Right Atrium: Right atrial size was normal in size. Pericardium: There is no evidence of pericardial effusion. Mitral Valve: The mitral valve is normal in structure. Trivial mitral valve regurgitation. No evidence of mitral valve stenosis. Tricuspid Valve: The tricuspid valve is normal in structure. Tricuspid valve regurgitation is trivial. Aortic Valve: The aortic valve is tricuspid. Aortic valve regurgitation is not visualized. Aortic valve sclerosis is present, with no evidence of aortic valve stenosis.  Pulmonic Valve: The pulmonic valve was normal in structure. Pulmonic valve regurgitation is trivial. Aorta: Aortic dilatation  noted. There is borderline dilatation of the ascending aorta, measuring 38 mm. Venous: The inferior vena cava is normal in size with greater than 50% respiratory variability, suggesting right atrial pressure of 3 mmHg. IAS/Shunts: The atrial septum is grossly normal.  LEFT VENTRICLE PLAX 2D LVIDd:         5.00 cm   Diastology LVIDs:         3.20 cm   LV e' medial:    8.05 cm/s LV PW:         1.00 cm   LV E/e' medial:  7.7 LV IVS:        1.00 cm   LV e' lateral:   11.90 cm/s LVOT diam:     2.00 cm   LV E/e' lateral: 5.2 LV SV:         66 LV SV Index:   29 LVOT Area:     3.14 cm  RIGHT VENTRICLE RV Basal diam:  4.00 cm RV Mid diam:    3.30 cm RV S prime:     16.80 cm/s TAPSE (M-mode): 3.2 cm LEFT ATRIUM             Index        RIGHT ATRIUM           Index LA diam:        3.50 cm 1.56 cm/m   RA Area:     20.80 cm LA Vol (A2C):   57.9 ml 25.85 ml/m  RA Volume:   64.20 ml  28.68 ml/m LA Vol (A4C):   44.9 ml 20.06 ml/m LA Biplane Vol: 54.9 ml 24.53 ml/m  AORTIC VALVE LVOT Vmax:   88.70 cm/s LVOT Vmean:  53.600 cm/s LVOT VTI:    0.209 m  AORTA Ao Root diam: 3.80 cm Ao Asc diam:  3.80 cm MITRAL VALVE MV Area (PHT): 3.30 cm    SHUNTS MV Decel Time: 230 msec    Systemic VTI:  0.21 m MV E velocity: 62.10 cm/s  Systemic Diam: 2.00 cm MV A velocity: 67.70 cm/s MV E/A ratio:  0.92 Laurance Flatten MD Electronically signed by Laurance Flatten MD Signature Date/Time: 05/30/2023/12:08:49 PM    Final    US Venous Img Lower Unilateral Left (DVT)  Result Date: 05/23/2023 CLINICAL DATA:  Left lower extremity pain and swelling EXAM: LEFT LOWER EXTREMITY VENOUS DOPPLER ULTRASOUND TECHNIQUE: Gray-scale sonography with graded compression, as well as color Doppler and duplex ultrasound were performed to evaluate the lower extremity deep venous systems from the level of the common femoral vein and  including the common femoral, femoral, profunda femoral, popliteal and calf veins including the posterior tibial, peroneal and gastrocnemius veins when visible. The superficial great saphenous vein was also interrogated. Spectral Doppler was utilized to evaluate flow at rest and with distal augmentation maneuvers in the common femoral, femoral and popliteal veins. COMPARISON:  None Available. FINDINGS: Contralateral Common Femoral Vein: Respiratory phasicity is normal and symmetric with the symptomatic side. No evidence of thrombus. Normal compressibility. Common Femoral Vein: No evidence of thrombus. Normal compressibility, respiratory phasicity and response to augmentation. Saphenofemoral Junction: No evidence of thrombus. Normal compressibility and flow on color Doppler imaging. Profunda Femoral Vein: No evidence of thrombus. Normal compressibility and flow on color Doppler imaging. Femoral Vein: No evidence of thrombus. Normal compressibility, respiratory phasicity and response to augmentation. Popliteal Vein: No evidence of thrombus. Normal compressibility, respiratory phasicity and response to augmentation. Calf Veins: No evidence of thrombus. Normal compressibility and flow on  color Doppler imaging. Superficial Great Saphenous Vein: No evidence of thrombus. Normal compressibility. Venous Reflux:  None. Other Findings:  Calf edema is noted. IMPRESSION: No evidence of deep venous thrombosis. Electronically Signed   By: Alcide Clever M.D.   On: 05/23/2023 03:29   VAS [place] LOWER EXTREMITY VENOUS (DVT)  Result Date: 05/17/2023  Lower Venous DVT Study Patient Name:  Edwin [name]  Date of Exam:   05/17/2023 Medical Rec #: [id number]        Accession #:    [id number] Date of Birth: 1956/11/06         Patient Gender: M Patient Age:   30 years Exam Location:  [facility] Procedure:      VAS [place] LOWER EXTREMITY VENOUS (DVT) Referring Phys: VYVYAN SUN  --------------------------------------------------------------------------------  Indications: Pain, Swelling, Edema, and ? Cellulitis.  Comparison Study: 07/01/22 - LEV - Negative DVT Performing Technologist: Gisela Sink Sturdivant RDMS, RVT  Examination Guidelines: A complete evaluation includes B-mode imaging, spectral Doppler, color Doppler, and power Doppler as needed of all accessible portions of each vessel. Bilateral testing is considered an integral part of a complete examination. Limited examinations for reoccurring indications may be performed as noted. The reflux portion of the exam is performed with the patient in reverse Trendelenburg.  +-----+---------------+---------+-----------+----------+--------------+ RIGHTCompressibilityPhasicitySpontaneityPropertiesThrombus Aging +-----+---------------+---------+-----------+----------+--------------+ CFV  Full           Yes      Yes                                 +-----+---------------+---------+-----------+----------+--------------+ SFJ  Full                                                        +-----+---------------+---------+-----------+----------+--------------+   +---------+---------------+---------+-----------+----------+--------------+ LEFT     CompressibilityPhasicitySpontaneityPropertiesThrombus Aging +---------+---------------+---------+-----------+----------+--------------+ CFV      Full           Yes      Yes                                 +---------+---------------+---------+-----------+----------+--------------+ SFJ      Full                                                        +---------+---------------+---------+-----------+----------+--------------+ FV Prox  Full                                                        +---------+---------------+---------+-----------+----------+--------------+ FV Mid   Full                                                         +---------+---------------+---------+-----------+----------+--------------+ FV DistalFull                                                        +---------+---------------+---------+-----------+----------+--------------+  PFV      Full                                                        +---------+---------------+---------+-----------+----------+--------------+ POP      Full           Yes      Yes                                 +---------+---------------+---------+-----------+----------+--------------+ PTV      Full                                                        +---------+---------------+---------+-----------+----------+--------------+ PERO     Full                                                        +---------+---------------+---------+-----------+----------+--------------+    Summary: RIGHT: - No evidence of common femoral vein obstruction.  LEFT: - There is no evidence of deep vein thrombosis in the lower extremity.  - No cystic structure found in the popliteal fossa.  *See table(s) above for measurements and observations. Electronically signed by Coral Else MD on 05/17/2023 at 10:44:31 PM.    Final     Microbiology: Results for orders placed or performed during the hospital encounter of 06/02/22  Resp Panel by RT-PCR (Flu A&B, Covid) Anterior Nasal Swab     Status: None   Collection Time: 06/02/22  5:51 PM   Specimen: Anterior Nasal Swab  Result Value Ref Range Status   SARS Coronavirus 2 by RT PCR NEGATIVE NEGATIVE Final    Comment: (NOTE) SARS-CoV-2 target nucleic acids are NOT DETECTED.  The SARS-CoV-2 RNA is generally detectable in upper respiratory specimens during the acute phase of infection. The lowest concentration of SARS-CoV-2 viral copies this assay can detect is 138 copies/mL. A negative result does not preclude SARS-Cov-2 infection and should not be used as the sole basis for treatment or other patient management decisions. A  negative result may occur with  improper specimen collection/handling, submission of specimen other than nasopharyngeal swab, presence of viral mutation(s) within the areas targeted by this assay, and inadequate number of viral copies(<138 copies/mL). A negative result must be combined with clinical observations, patient history, and epidemiological information. The expected result is Negative.  Fact Sheet for Patients:  BloggerCourse.com  Fact Sheet for Healthcare Providers:  SeriousBroker.it  This test is no t yet approved or cleared by the [place] FDA and  has been authorized for detection and/or diagnosis of SARS-CoV-2 by FDA under an Emergency Use Authorization (EUA). This EUA will remain  in effect (meaning this test can be used) for the duration of the COVID-19 declaration under Section 564(b)(1) of the Act, 21 U.S.C.section 360bbb-3(b)(1), unless the authorization is terminated  or revoked sooner.       Influenza A by PCR NEGATIVE NEGATIVE Final   Influenza B by PCR NEGATIVE  NEGATIVE Final    Comment: (NOTE) The Xpert Xpress SARS-CoV-2/FLU/RSV plus assay is intended as an aid in the diagnosis of influenza from Nasopharyngeal swab specimens and should not be used as a sole basis for treatment. Nasal washings and aspirates are unacceptable for Xpert Xpress SARS-CoV-2/FLU/RSV testing.  Fact Sheet for Patients: BloggerCourse.com  Fact Sheet for Healthcare Providers: SeriousBroker.it  This test is not yet approved or cleared by the [place] FDA and has been authorized for detection and/or diagnosis of SARS-CoV-2 by FDA under an Emergency Use Authorization (EUA). This EUA will remain in effect (meaning this test can be used) for the duration of the COVID-19 declaration under Section 564(b)(1) of the Act, 21 U.S.C. section 360bbb-3(b)(1), unless the authorization  is terminated or revoked.  Performed at [facility], [address] [place], [place] [address]     Labs: CBC: Recent Labs  Lab 05/29/23 1222 05/30/23 0427  WBC 5.3 4.7  NEUTROABS 3.6  --   HGB 11.4* 12.0*  HCT 34.8* 37.3*  MCV 95.1 98.4  PLT 184 180   Basic Metabolic Panel: Recent Labs  Lab 05/29/23 1222 05/30/23 0425 05/30/23 0427 05/31/23 0354  NA 137  --  137 139  K 4.3  --  4.3 4.2  CL 102  --  104 104  CO2 23  --  24 22  GLUCOSE 160*  --  119* 99  BUN 42*  --  40* 40*  CREATININE 1.86*  --  1.84* 2.09*  2.08*  CALCIUM 8.6*  --  8.7* 8.8*  MG 2.4 2.5*  --   --    Liver Function Tests: Recent Labs  Lab 05/29/23 1222  AST 19  ALT 21  ALKPHOS 54  BILITOT 0.4  PROT 6.5  ALBUMIN 3.7   CBG: Recent Labs  Lab 05/30/23 1128 05/30/23 1657 05/30/23 2053 05/31/23 0726 05/31/23 1111  GLUCAP 88 90 132* 118* 94   35 minutes.   Signed: Kathlen Mody, MD [facility] 05/31/2023

## 2023-05-31 NOTE — Evaluation (Signed)
Physical Therapy One Time Evaluation Patient Details Name: Edwin [name] MRN: [id number] DOB: 1956-03-29 Today's Date: 05/31/2023  History of Present Illness  67 year old gentleman with prior h/o hypertension, type 2 DM, bipolar disorder, CKD, stroke, bradycardia, sleep apnea presents to ED for recurrent cellulitis of the left lower extremity, after failed outpatient treatment with keflex, doxycycline and clindamycin.  Pt also with bradycardia (EKG shows bradycardia with 1 degree AV block)  Clinical Impression  Patient evaluated by Physical Therapy with no further acute PT needs identified. All education has been completed and the patient has no further questions.  Pt mobilizing well and not requiring assistive device.  Monitored HR during ambulation: 70 - 90- 117 - 135 - 116 bpm (50 bpm at rest, 47 bpm upon return sitting in recliner). RN notified.   PT is signing off. Thank you for this referral.         Assistance Recommended at Discharge None  If plan is discharge home, recommend the following:  Can travel by private vehicle           Equipment Recommendations None recommended by PT  Recommendations for Other Services       Functional Status Assessment Patient has not had a recent decline in their functional status     Precautions / Restrictions Precautions Precautions: Other (comment) Precaution Comments: monitor HR Restrictions Weight Bearing Restrictions: No      Mobility  Bed Mobility                    Transfers Overall transfer level: Independent                      Ambulation/Gait Ambulation/Gait assistance: Modified independent (Device/Increase time), Supervision Gait Distance (Feet): 800 Feet Assistive device: None Gait Pattern/deviations: WFL(Within Functional Limits)       General Gait Details: no unsteadiness or LOB observed, monitored HR during ambulation: 70 - 90- 117 - 135 - 116 bpm (50 bpm at rest, 47 bpm upon return sitting  in recliner) Banker notified)  Stairs            Wheelchair Mobility     Tilt Bed    Modified Rankin (Stroke Patients Only)       Balance Overall balance assessment: No apparent balance deficits (not formally assessed)                                           Pertinent Vitals/Pain Pain Assessment Pain Assessment: No/denies pain    Home Living Family/patient expects to be discharged to:: Private residence Living Arrangements: Spouse/significant other Available Help at Discharge: Friend(s);Neighbor;Available PRN/intermittently Type of Home: House         Home Layout: One level Home Equipment: Grab bars - tub/shower;Educational psychologist (4 wheels)      Prior Function Prior Level of Function : Independent/Modified Independent;Driving               ADLs Comments: Works as Engineer, agricultural, Independent with ADLs, IADLs, drives, grocery shops and goes out to eat a lot     Hand Dominance        Extremity/Trunk Assessment   Upper Extremity Assessment Upper Extremity Assessment: Overall WFL for tasks assessed    Lower Extremity Assessment Lower Extremity Assessment: Overall WFL for tasks assessed    Cervical / Trunk Assessment Cervical / Trunk Assessment:  Kyphotic;Other exceptions Cervical / Trunk Exceptions: forward head posture  Communication   Communication: No difficulties  Cognition Arousal/Alertness: Awake/alert Behavior During Therapy: WFL for tasks assessed/performed Overall Cognitive Status: Within Functional Limits for tasks assessed                                          General Comments      Exercises     Assessment/Plan    PT Assessment Patient does not need any further PT services  PT Problem List         PT Treatment Interventions      PT Goals (Current goals can be found in the Care Plan section)  Acute Rehab PT Goals PT Goal Formulation: All assessment and education complete, DC  therapy    Frequency       Co-evaluation               AM-PAC PT "6 Clicks" Mobility  Outcome Measure Help needed turning from your back to your side while in a flat bed without using bedrails?: None Help needed moving from lying on your back to sitting on the side of a flat bed without using bedrails?: None Help needed moving to and from a bed to a chair (including a wheelchair)?: None Help needed standing up from a chair using your arms (e.g., wheelchair or bedside chair)?: None Help needed to walk in hospital room?: None Help needed climbing 3-5 steps with a railing? : None 6 Click Score: 24    End of Session   Activity Tolerance: Patient tolerated treatment well Patient left: in chair;with call bell/phone within reach;with family/visitor present Nurse Communication: Mobility status PT Visit Diagnosis: Difficulty in walking, not elsewhere classified (R26.2)    Time: 1610-9604 PT Time Calculation (min) (ACUTE ONLY): 16 min   Charges:   PT Evaluation $PT Eval Low Complexity: 1 Low   PT General Charges $$ ACUTE PT VISIT: 1 Visit        Paulino Door, DPT Physical Therapist Acute Rehabilitation Services Office: [phone]   Janan Halter Payson 05/31/2023, 12:26 PM

## 2023-05-31 NOTE — Progress Notes (Signed)
Follow up has been made. Cardiology will sign off, please let us know if you have questions.

## 2023-06-03 ENCOUNTER — Telehealth: Payer: Self-pay | Admitting: Cardiology

## 2023-06-03 DIAGNOSIS — E782 Mixed hyperlipidemia: Secondary | ICD-10-CM

## 2023-06-03 NOTE — Telephone Encounter (Signed)
Pt walked into [facility] office and is requesting Pharm D referral from Dr.Skains to discuss recent hospitalization due to medication reactions by multiple specialists. Advised Pt I would send message and then someone would contact patient to schedule PharmD appt.

## 2023-06-04 ENCOUNTER — Encounter: Payer: Medicare Other | Admitting: Physical Therapy

## 2023-06-04 DIAGNOSIS — I129 Hypertensive chronic kidney disease with stage 1 through stage 4 chronic kidney disease, or unspecified chronic kidney disease: Secondary | ICD-10-CM | POA: Diagnosis not present

## 2023-06-04 DIAGNOSIS — L03116 Cellulitis of left lower limb: Secondary | ICD-10-CM | POA: Diagnosis not present

## 2023-06-04 DIAGNOSIS — N183 Chronic kidney disease, stage 3 unspecified: Secondary | ICD-10-CM | POA: Diagnosis not present

## 2023-06-05 NOTE — Telephone Encounter (Addendum)
Referral placed for the Pharm D per pt request.   Pt has APP appt 06/17/23.

## 2023-06-05 NOTE — Addendum Note (Signed)
Addended by: Bertram Millard on: 06/05/2023 04:25 PM   Modules accepted: Orders

## 2023-06-06 DIAGNOSIS — N138 Other obstructive and reflux uropathy: Secondary | ICD-10-CM | POA: Diagnosis not present

## 2023-06-06 DIAGNOSIS — N401 Enlarged prostate with lower urinary tract symptoms: Secondary | ICD-10-CM | POA: Diagnosis not present

## 2023-06-08 ENCOUNTER — Emergency Department (HOSPITAL_BASED_OUTPATIENT_CLINIC_OR_DEPARTMENT_OTHER)
Admission: EM | Admit: 2023-06-08 | Discharge: 2023-06-08 | Disposition: A | Discharge status: Home / Self Care | Payer: Medicare Other | Attending: Emergency Medicine | Admitting: Emergency Medicine

## 2023-06-08 ENCOUNTER — Emergency Department (HOSPITAL_BASED_OUTPATIENT_CLINIC_OR_DEPARTMENT_OTHER): Payer: Medicare Other

## 2023-06-08 ENCOUNTER — Encounter (HOSPITAL_BASED_OUTPATIENT_CLINIC_OR_DEPARTMENT_OTHER): Payer: Self-pay

## 2023-06-08 DIAGNOSIS — E1122 Type 2 diabetes mellitus with diabetic chronic kidney disease: Secondary | ICD-10-CM | POA: Insufficient documentation

## 2023-06-08 DIAGNOSIS — M7989 Other specified soft tissue disorders: Secondary | ICD-10-CM | POA: Diagnosis not present

## 2023-06-08 DIAGNOSIS — I129 Hypertensive chronic kidney disease with stage 1 through stage 4 chronic kidney disease, or unspecified chronic kidney disease: Secondary | ICD-10-CM | POA: Diagnosis not present

## 2023-06-08 DIAGNOSIS — N1832 Chronic kidney disease, stage 3b: Secondary | ICD-10-CM | POA: Diagnosis not present

## 2023-06-08 DIAGNOSIS — N189 Chronic kidney disease, unspecified: Secondary | ICD-10-CM | POA: Diagnosis not present

## 2023-06-08 DIAGNOSIS — L03116 Cellulitis of left lower limb: Secondary | ICD-10-CM

## 2023-06-08 DIAGNOSIS — Z79899 Other long term (current) drug therapy: Secondary | ICD-10-CM | POA: Insufficient documentation

## 2023-06-08 DIAGNOSIS — Z7902 Long term (current) use of antithrombotics/antiplatelets: Secondary | ICD-10-CM | POA: Insufficient documentation

## 2023-06-08 DIAGNOSIS — R600 Localized edema: Secondary | ICD-10-CM | POA: Insufficient documentation

## 2023-06-08 LAB — COMPREHENSIVE METABOLIC PANEL
ALT: 20 U/L (ref 0–44)
AST: 17 U/L (ref 15–41)
Albumin: 4.4 g/dL (ref 3.5–5.0)
Alkaline Phosphatase: 60 U/L (ref 38–126)
Anion gap: 10 (ref 5–15)
BUN: 35 mg/dL — ABNORMAL HIGH (ref 8–23)
CO2: 28 mmol/L (ref 22–32)
Calcium: 9.4 mg/dL (ref 8.9–10.3)
Chloride: 101 mmol/L (ref 98–111)
Creatinine, Ser: 1.72 mg/dL — ABNORMAL HIGH (ref 0.61–1.24)
GFR, Estimated: 44 mL/min — ABNORMAL LOW (ref >60)
Glucose, Bld: 97 mg/dL (ref 70–99)
Potassium: 4.4 mmol/L (ref 3.5–5.1)
Sodium: 139 mmol/L (ref 135–145)
Total Bilirubin: 0.3 mg/dL (ref 0.3–1.2)
Total Protein: 6.7 g/dL (ref 6.5–8.1)

## 2023-06-08 LAB — CBC WITH DIFFERENTIAL/PLATELET
Abs Immature Granulocytes: 0.02 10*3/uL (ref 0.00–0.07)
Basophils Absolute: 0.1 10*3/uL (ref 0.0–0.1)
Basophils Relative: 1 %
Eosinophils Absolute: 0.2 10*3/uL (ref 0.0–0.5)
Eosinophils Relative: 3 %
HCT: 36.1 % — ABNORMAL LOW (ref 39.0–52.0)
Hemoglobin: 12.1 g/dL — ABNORMAL LOW (ref 13.0–17.0)
Immature Granulocytes: 0 %
Lymphocytes Relative: 25 %
Lymphs Abs: 1.5 10*3/uL (ref 0.7–4.0)
MCH: 31.6 pg (ref 26.0–34.0)
MCHC: 33.5 g/dL (ref 30.0–36.0)
MCV: 94.3 fL (ref 80.0–100.0)
Monocytes Absolute: 0.5 10*3/uL (ref 0.1–1.0)
Monocytes Relative: 9 %
Neutro Abs: 3.6 10*3/uL (ref 1.7–7.7)
Neutrophils Relative %: 62 %
Platelets: 216 10*3/uL (ref 150–400)
RBC: 3.83 MIL/uL — ABNORMAL LOW (ref 4.22–5.81)
RDW: 12.7 % (ref 11.5–15.5)
WBC: 5.9 10*3/uL (ref 4.0–10.5)
nRBC: 0 % (ref 0.0–0.2)

## 2023-06-08 LAB — URINALYSIS, ROUTINE W REFLEX MICROSCOPIC
Bacteria, UA: NONE SEEN
Bilirubin Urine: NEGATIVE
Glucose, UA: >1000 mg/dL — AB
Hgb urine dipstick: NEGATIVE
Ketones, ur: NEGATIVE mg/dL
Leukocytes,Ua: NEGATIVE
Nitrite: NEGATIVE
Protein, ur: NEGATIVE mg/dL
Specific Gravity, Urine: 1.008 (ref 1.005–1.030)
pH: 7 (ref 5.0–8.0)

## 2023-06-08 LAB — LACTIC ACID, PLASMA: Lactic Acid, Venous: 1.3 mmol/L (ref 0.5–1.9)

## 2023-06-08 LAB — BRAIN NATRIURETIC PEPTIDE: B Natriuretic Peptide: 66.7 pg/mL (ref 0.0–100.0)

## 2023-06-08 MED ORDER — DOXYCYCLINE HYCLATE 100 MG PO TABS
100.0000 mg | ORAL_TABLET | Freq: Once | ORAL | Status: AC
  Administered 2023-06-08: 100 mg via ORAL
  Filled 2023-06-08: qty 1

## 2023-06-08 MED ORDER — DOXYCYCLINE HYCLATE 100 MG PO CAPS: 100.0000 mg | ORAL_CAPSULE | Freq: Two times a day (BID) | ORAL | 0 refills | Status: AC

## 2023-06-08 MED ORDER — CEPHALEXIN 500 MG PO CAPS: 500.0000 mg | ORAL_CAPSULE | Freq: Four times a day (QID) | ORAL | 0 refills | Status: AC

## 2023-06-08 MED ORDER — CEPHALEXIN 250 MG PO CAPS
500.0000 mg | ORAL_CAPSULE | Freq: Once | ORAL | Status: AC
  Administered 2023-06-08: 500 mg via ORAL
  Filled 2023-06-08: qty 2

## 2023-06-08 NOTE — ED Notes (Signed)
Pt able to ambulate to bathroom to provide UA. Denies pain, call light in reach, family at bedside.

## 2023-06-08 NOTE — ED Triage Notes (Signed)
He states he was recently hospitalized at [facility] ([place]) for dx of lower extremity cellulitis. He states he has finished his antibiotic(s) and c/o left lower leg is "beginning to swell again". He is ambulatory and in no distress.

## 2023-06-08 NOTE — ED Provider Notes (Signed)
[place] EMERGENCY DEPARTMENT AT [facility] Provider Note   CSN: [id number] Arrival date & time: 06/08/23  0944     History  Chief Complaint  Patient presents with   Leg Swelling    Edwin [name] is a 67 y.o. male.  Patient is a 67 year old male with a past medical history of hypertension, CKD, bipolar, diabetes, CVA presenting to the emergency department with left lower extremity swelling.  The patient states that he has had swelling in his left leg for about the past month.  He states that he was initially seen by his primary doctor and had a negative DVT study and was started on antibiotics.  He states that the swelling did not improve and he was admitted to the hospital for IV antibiotics.  He states that the swelling did improve at that time but it never completely resolved.  He states that after finishing the antibiotics his swelling started to come back and is now feeling tightness in his thigh as well.  He states that he has had some mild redness to his leg.  He denies any fevers, chest pain or shortness of breath.  He denies any known history of DVT though did have a 3-day hospitalization for the cellulitis.  The history is provided by the patient.       Home Medications Prior to Admission medications   Medication Sig Start Date End Date Taking? Authorizing Provider  cephALEXin (KEFLEX) 500 MG capsule Take 1 capsule (500 mg total) by mouth 4 (four) times daily for 7 days. 06/08/23 06/15/23 Yes Elayne Snare K, DO  doxycycline (VIBRAMYCIN) 100 MG capsule Take 1 capsule (100 mg total) by mouth 2 (two) times daily for 7 days. 06/08/23 06/15/23 Yes Kingsley, Benetta Spar K, DO  amLODipine (NORVASC) 5 MG tablet Take 5 mg by mouth daily. 05/28/23   [provider]  Cholecalciferol 1000 units TBDP Take 1 tablet by mouth daily.     [provider]  clopidogrel (PLAVIX) 75 MG tablet TAKE ONE TABLET BY MOUTH DAILY 04/23/23   Butch Penny, NP  dapagliflozin  propanediol (FARXIGA) 10 MG TABS tablet Take 10 mg by mouth daily.    [provider]  divalproex (DEPAKOTE ER) 500 MG 24 hr tablet Take 2 tablets (1,000 mg total) by mouth at bedtime. 05/27/23   Cottle, Steva Ready., MD  DULoxetine (CYMBALTA) 60 MG capsule Take 2 capsules (120 mg total) by mouth daily. 05/27/23   Cottle, Steva Ready., MD  febuxostat (ULORIC) 40 MG tablet Take 40 mg by mouth daily.    [provider]  furosemide (LASIX) 20 MG tablet Take 20 mg by mouth daily as needed for edema. May take 2 to 3 days weekly 07/21/19   [provider]  gabapentin (NEURONTIN) 400 MG capsule Take 400 mg by mouth 2 (two) times daily.    [provider]  KERENDIA 10 MG TABS Take 1 tablet by mouth daily. 03/18/23   [provider]  lamoTRIgine (LAMICTAL) 200 MG tablet TAKE ONE TABLET BY MOUTH IN THE MORNING 05/19/23   Cottle, Steva Ready., MD  LevOCARNitine (L-CARNITINE) 500 MG TABS Take 500 mg by mouth 2 (two) times daily.    [provider]  multivitamin-lutein (OCUVITE-LUTEIN) CAPS capsule Take 1 capsule by mouth daily.    [provider]  pramipexole (MIRAPEX) 0.5 MG tablet Take 1 tablet (0.5 mg total) by mouth 2 (two) times daily. 05/27/23   Cottle, Steva Ready., MD  pregabalin (  LYRICA) 25 MG capsule TAKE ONE CAPSULE BY MOUTH TWICE DAILY 05/30/23   Levert Feinstein, MD  tamsulosin (FLOMAX) 0.4 MG CAPS capsule Take 0.4 mg by mouth daily. 06/04/22   [provider]  VELTASSA 8.4 g packet Take 8.4 g by mouth See admin instructions. Take one packet by mouth three times a week. Days vary.    [provider]      Allergies    Allopurinol, Penicillins, Finerenone, Penicillin g, Aspirin, and Ciprofloxacin    Review of Systems   Review of Systems  Physical Exam Updated Vital Signs BP 130/66   Pulse (!) 45   Temp 98 F (36.7 C) (Oral)   Resp 17   SpO2 100%  Physical Exam Vitals and nursing note reviewed.  Constitutional:      General: He  is not in acute distress.    Appearance: Normal appearance.  HENT:     Head: Normocephalic and atraumatic.     Nose: Nose normal.     Mouth/Throat:     Mouth: Mucous membranes are moist.     Pharynx: Oropharynx is clear.  Eyes:     Extraocular Movements: Extraocular movements intact.     Conjunctiva/sclera: Conjunctivae normal.  Cardiovascular:     Rate and Rhythm: Normal rate and regular rhythm.     Pulses: Normal pulses.     Heart sounds: Normal heart sounds.  Pulmonary:     Effort: Pulmonary effort is normal.     Breath sounds: Normal breath sounds.  Abdominal:     General: Abdomen is flat.     Palpations: Abdomen is soft.     Tenderness: There is no abdominal tenderness.  Musculoskeletal:        General: Normal range of motion.     Cervical back: Normal range of motion.     Left lower leg: Edema (Nonpitting to the knee) present.  Skin:    General: Skin is warm and dry.     Findings: Erythema (Mild in left lower extremity) present.  Neurological:     General: No focal deficit present.     Mental Status: He is alert and oriented to person, place, and time.  Psychiatric:        Mood and Affect: Mood normal.        Behavior: Behavior normal.     ED Results / Procedures / Treatments   Labs (all labs ordered are listed, but only abnormal results are displayed) Labs Reviewed  COMPREHENSIVE METABOLIC PANEL - Abnormal; Notable for the following components:      Result Value   BUN 35 (*)    Creatinine, Ser 1.72 (*)    GFR, Estimated 44 (*)    All other components within normal limits  CBC WITH DIFFERENTIAL/PLATELET - Abnormal; Notable for the following components:   RBC 3.83 (*)    Hemoglobin 12.1 (*)    HCT 36.1 (*)    All other components within normal limits  URINALYSIS, ROUTINE W REFLEX MICROSCOPIC - Abnormal; Notable for the following components:   Glucose, UA >1,000 (*)    All other components within normal limits  LACTIC ACID, PLASMA  BRAIN NATRIURETIC  PEPTIDE    EKG None  Radiology US Venous Img Lower  Left (DVT Study)  Result Date: 06/08/2023 CLINICAL DATA:  Left leg swelling today. Recent hospitalization for cellulitis. Concern for DVT. EXAM: LEFT LOWER EXTREMITY VENOUS DOPPLER ULTRASOUND TECHNIQUE: Gray-scale sonography with compression, as well as color and duplex ultrasound, were performed to evaluate  the deep venous system(s) from the level of the common femoral vein through the popliteal and proximal calf veins. COMPARISON:  Prior examination 05/23/2023 FINDINGS: VENOUS Normal compressibility of the common femoral, superficial femoral, and popliteal veins, as well as the visualized calf veins. Visualized portions of profunda femoral vein and great saphenous vein unremarkable. No filling defects to suggest DVT on grayscale or color Doppler imaging. Doppler waveforms show normal direction of venous flow, normal respiratory plasticity and response to augmentation. Limited views of the contralateral common femoral vein are unremarkable. OTHER None. Limitations: none IMPRESSION: No evidence of acute left lower extremity DVT. Electronically Signed   By: Carey Bullocks M.D.   On: 06/08/2023 13:03    Procedures Procedures    Medications Ordered in ED Medications  cephALEXin (KEFLEX) capsule 500 mg (has no administration in time range)  doxycycline (VIBRA-TABS) tablet 100 mg (has no administration in time range)    ED Course/ Medical Decision Making/ A&P Clinical Course as of 06/08/23 1444  Sat Jun 08, 2023  1440 DVT study is negative.  The patient's labs show an improving creatinine and otherwise within normal range.  Patient will be restarted on treatment for cellulitis and was recommended to restart his Lasix and use his compression stockings and to follow-up with his primary doctor. [VK]    Clinical Course User Index [VK] Rexford Maus, DO                             Medical Decision Making This patient presents to the ED  with chief complaint(s) of LE swelling with pertinent past medical history of HTN, DM, CKD, CVA, bipolar which further complicates the presenting complaint. The complaint involves an extensive differential diagnosis and also carries with it a high risk of complications and morbidity.    The differential diagnosis includes DVT, cellulitis, venous insufficiency, CHF, hepatic dysfunction, renal dysfunction  Additional history obtained: Additional history obtained from family Records reviewed previous admission documents  ED Course and Reassessment: On patient's arrival to the emergency department he is afebrile and hemodynamically stable in no acute distress.  His leg is neurovascularly intact.  He does have significant swelling to the left leg compared to the right will have repeat DVT study performed to evaluate for possible blood clot.  Will repeat labs performed to evaluate for infection, worsening renal dysfunction, hepatic dysfunction or CHF as causes of his leg swelling and he will be closely reassessed.  Independent labs interpretation:  The following labs were independently interpreted: Creatinine improved to baseline, labs otherwise within normal range  Independent visualization of imaging: - I independently visualized the following imaging with scope of interpretation limited to determining acute life threatening conditions related to emergency care: Left leg DVT ultrasound, which revealed negative for DVT  Consultation: - Consulted or discussed management/test interpretation w/ external professional: N/A  Consideration for admission or further workup: Patient has no emergent conditions requiring admission or further work-up at this time and is stable for discharge home with primary care follow-up  Social Determinants of health: N/A    Amount and/or Complexity of Data Reviewed Labs: ordered.  Risk Prescription drug management.          Final Clinical Impression(s) / ED  Diagnoses Final diagnoses:  Left leg swelling  Cellulitis of leg, left    Rx / DC Orders ED Discharge Orders          Ordered    cephALEXin (KEFLEX)  500 MG capsule  4 times daily        06/08/23 1443    doxycycline (VIBRAMYCIN) 100 MG capsule  2 times daily        06/08/23 1443              Rexford Maus, DO 06/08/23 1444

## 2023-06-08 NOTE — Discharge Instructions (Signed)
You were seen in the emergency department for left leg swelling.  Your workup showed no signs of blood clot, no signs of heart failure and no signs of liver dysfunction.  Your kidney function has improved back to normal.  You may have recurrent cellulitis with the redness of your leg and I have restarted you on antibiotics.  You can repeat start your Lasix and also wear your compression stockings to help with the swelling.  You can follow-up with your primary doctor in the next few days to have your symptoms rechecked.  You should return to the emergency department if you are having increasing redness up your leg, fevers despite the antibiotics or any other new or concerning symptoms.

## 2023-06-10 DIAGNOSIS — N183 Chronic kidney disease, stage 3 unspecified: Secondary | ICD-10-CM | POA: Diagnosis not present

## 2023-06-12 DIAGNOSIS — R809 Proteinuria, unspecified: Secondary | ICD-10-CM | POA: Diagnosis not present

## 2023-06-12 DIAGNOSIS — N183 Chronic kidney disease, stage 3 unspecified: Secondary | ICD-10-CM | POA: Diagnosis not present

## 2023-06-12 DIAGNOSIS — I129 Hypertensive chronic kidney disease with stage 1 through stage 4 chronic kidney disease, or unspecified chronic kidney disease: Secondary | ICD-10-CM | POA: Diagnosis not present

## 2023-06-12 DIAGNOSIS — D631 Anemia in chronic kidney disease: Secondary | ICD-10-CM | POA: Diagnosis not present

## 2023-06-13 ENCOUNTER — Ambulatory Visit: Payer: Medicare Other | Admitting: Psychiatry

## 2023-06-13 ENCOUNTER — Telehealth: Payer: Self-pay | Admitting: Psychiatry

## 2023-06-13 DIAGNOSIS — N401 Enlarged prostate with lower urinary tract symptoms: Secondary | ICD-10-CM | POA: Diagnosis not present

## 2023-06-13 DIAGNOSIS — N529 Male erectile dysfunction, unspecified: Secondary | ICD-10-CM | POA: Diagnosis not present

## 2023-06-13 NOTE — Telephone Encounter (Signed)
LVM to [facility]. Cardiologist is Dr. Anne Fu.

## 2023-06-13 NOTE — Telephone Encounter (Signed)
Who is his cardiologist?  Which med do they suspect?  I have never seen this with any of his psych meds nor can I find research evidence for it.  There are psych meds which can do this but he's not on any of the usual suspects

## 2023-06-13 NOTE — Telephone Encounter (Signed)
Called patient and suggested he contact PCP or cardiologist. He reports cardiologist thinks it is his psych meds that is causing the bradycardia. He was recently in the hospital for cellulitis and EKG revealed sinus bradycardia with AVB. His HR at that time was 45. He said it dips but then comes back up. He stated he had been on the same meds for several years and had been stable. 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   Reviewed meds. Depakote 1000 mg at bedtime Duloxetine 120 mg  Lamictal 200 mg  Mirapex 0.5 mg BID

## 2023-06-13 NOTE — Telephone Encounter (Signed)
Pt on urgent canc list. Pt mentioned heart rate in the 20's and need appt ASAP. Provider sick today RS. RTC to pt @ [phone]

## 2023-06-14 ENCOUNTER — Telehealth: Payer: Self-pay | Admitting: Cardiology

## 2023-06-14 NOTE — Telephone Encounter (Signed)
Pt c/o medication issue:  1. Name of Medication: Unknown   2. How are you currently taking this medication (dosage and times per day)?   3. Are you having a reaction (difficulty breathing--STAT)?   4. What is your medication issue?   Caller stated he was informed patient's symptoms may be related to his medication and wants a call back from Dr. Anne Fu to discuss this.

## 2023-06-14 NOTE — Telephone Encounter (Signed)
Spoke with Dr. Jennelle Human who states that the patient reported to him that Dr. Anne Fu was concerned that some of his psych meds may be causing bradycardia. Dr. Jennelle Human said that he has not seen any evidence of the medications that the patient is on causing bradycardia. He states that if Dr. Anne Fu had a specific concern about a particular medication then he can call him back to discuss.  After reviewing patient's chart I saw that he was recently in the hospital and was having bradycardia during that time. Patient also reported worsening fatigue and dozing off at times. Cardiology consulted and they did not believe symptoms were related to bradycardia but could be due to some his psych meds with sedating side effects.

## 2023-06-14 NOTE — Telephone Encounter (Signed)
Called Dr. Anne Fu office and LM to call me if there are concerns about his psych meds.

## 2023-06-14 NOTE — Telephone Encounter (Signed)
Patient informed. He has an appt with Cardiology next week and has also requested a PharmD visit to review all of his medications.

## 2023-06-16 NOTE — Progress Notes (Unsigned)
Cardiology Office Note:  .   Date:  06/17/2023  ID:  Edwin [name], DOB [birth year], MRN [id number] PCP: Deatra James, MD  [facility] Providers Cardiologist:  Donato Schultz, MD {   History of Present Illness: Edwin [name]   CHAUNCEY [name] is a 67 y.o. male with past medical history of hypertension and A-V dissociation here for follow-up appointment.  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  Has bipolar disease.  Diltiazem was used for high blood pressure and was discontinued and he continued to have slow heart rates.  Cardiac catheterization in 2012 was reassuring.  Thankfully after discontinuing the lithium he felt better.  Has felt better on Depakote.  Dr. Jennelle [name] is his psychiatrist.  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  Now stage III CKD.  Been seeing a nutritionist for his diet and losing weight.  Currently on Farxiga.  He was seen by Dr. Anne Fu February 2024 and was doing well at that time.   Today, he tells me his HR was plummeting and admitted for cellulitis on 7/20. 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 Zio monitor ordered today. Mild stroke in March of 2023, he is on lyrica and gabapentin. He follows with neurology.  Tried the CPAP but intolerable. Paid 2k to get the dental surgery but only works some of the time. He is inquiring about the [place] implant, fear of stroke. Dr. Vassie Loll has been looking after his sleep apnea. I did give him some information and asked him to reach out if he wanted [place] to start the process.   Reports no shortness of breath nor dyspnea on exertion. Reports no chest pain, pressure, or tightness. No edema, orthopnea, PND.  ROS: pertinent ROS in HPI  Studies Reviewed: .       Echo 05/30/23 IMPRESSIONS     1. Left ventricular ejection fraction, by estimation, is 60 to 65%. The  left ventricle has normal  function. The left ventricle has no regional  wall motion abnormalities. Left ventricular diastolic parameters are  consistent with Grade I diastolic  dysfunction (impaired relaxation).   2. Right ventricular systolic function is normal. The right ventricular  size is mildly enlarged. Tricuspid regurgitation signal is inadequate for  assessing PA pressure.   3. The mitral valve is normal in structure. Trivial mitral valve  regurgitation. No evidence of mitral stenosis.   4. The aortic valve is tricuspid. Aortic valve regurgitation is not  visualized. Aortic valve sclerosis is present, with no evidence of aortic  valve stenosis.   5. Aortic dilatation noted. There is borderline dilatation of the  ascending aorta, measuring 38 mm.   6. The inferior vena cava is normal in size with greater than 50%  respiratory variability, suggesting right atrial pressure of 3 mmHg.   Comparison(s): No significant change from prior study.   FINDINGS   Left Ventricle: Left ventricular ejection fraction, by estimation, is 60  to 65%. The left ventricle has normal function. The left ventricle has no  regional wall motion abnormalities. The left ventricular internal cavity  size was normal in size. There is   no left ventricular hypertrophy. Left ventricular diastolic parameters  are consistent with Grade I diastolic dysfunction (impaired relaxation).   Right Ventricle: The right ventricular size is mildly enlarged. No  increase in right ventricular wall thickness. Right ventricular systolic  function is normal. Tricuspid regurgitation signal is inadequate for  assessing PA pressure.   Left Atrium: Left atrial size was normal in size.   Right Atrium: Right atrial size was normal in size.   Pericardium: There is no evidence of pericardial effusion.   Mitral Valve: The mitral valve is normal in structure. Trivial mitral  valve regurgitation. No evidence of mitral valve stenosis.   Tricuspid Valve:  The tricuspid valve is normal in structure. Tricuspid  valve regurgitation is trivial.   Aortic Valve: The aortic valve is tricuspid. Aortic valve regurgitation is  not visualized. Aortic valve sclerosis is present, with no evidence of  aortic valve stenosis.   Pulmonic Valve: The pulmonic valve was normal in structure. Pulmonic valve  regurgitation is trivial.   Aorta: Aortic dilatation noted. There is borderline dilatation of the  ascending aorta, measuring 38 mm.   Venous: The inferior vena cava is normal in size with greater than 50%  respiratory variability, suggesting right atrial pressure of 3 mmHg.   IAS/Shunts: The atrial septum is grossly normal.   Physical Exam:   VS:  BP (!) 140/84   Pulse (!) 56   Ht 5\' 10"  (1.778 m)   Wt 235 lb 3.2 oz (106.7 kg)   SpO2 98%   BMI 33.75 kg/m    Wt Readings from Last 3 Encounters:  06/17/23 235 lb 3.2 oz (106.7 kg)  05/29/23 235 lb 10.8 oz (106.9 kg)  05/22/23 230 lb (104.3 kg)    GEN: Well nourished, well developed in no acute distress NECK: No JVD; No carotid bruits CARDIAC: RRR, no murmurs, rubs, gallops RESPIRATORY:  Clear to auscultation without rales, wheezing or rhonchi  ABDOMEN: Soft, non-tender, non-distended EXTREMITIES:  No edema; No deformity   ASSESSMENT AND PLAN: .   1.  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  CKD stage III -avoiding nephrotoxic agents -continue amlodipine 5 mg daily, Plavix 75 mg daily, Farxiga 10 mg daily, Lasix 20 mg daily  3.  Mixed hyperlipidemia -not taking a cholesterol pill at the moment -LDL 93 -due for updated labs with PCP  4. HTN -usually lower at home, sightly elevated today -continue current medications        Dispo: He can follow-up after monitor results, urgent referral to EP  Signed, Sharlene Dory, PA-C

## 2023-06-17 ENCOUNTER — Ambulatory Visit: Payer: Medicare Other | Attending: Physician Assistant

## 2023-06-17 ENCOUNTER — Encounter: Payer: Self-pay | Admitting: Physician Assistant

## 2023-06-17 ENCOUNTER — Other Ambulatory Visit: Payer: Self-pay | Admitting: Physician Assistant

## 2023-06-17 ENCOUNTER — Ambulatory Visit: Payer: Medicare Other | Attending: Physician Assistant | Admitting: Physician Assistant

## 2023-06-17 VITALS — BP 140/84 | HR 56 | Ht 70.0 in | Wt 235.2 lb

## 2023-06-17 DIAGNOSIS — I10 Essential (primary) hypertension: Secondary | ICD-10-CM

## 2023-06-17 DIAGNOSIS — I4589 Other specified conduction disorders: Secondary | ICD-10-CM | POA: Diagnosis not present

## 2023-06-17 DIAGNOSIS — E782 Mixed hyperlipidemia: Secondary | ICD-10-CM | POA: Diagnosis not present

## 2023-06-17 DIAGNOSIS — N182 Chronic kidney disease, stage 2 (mild): Secondary | ICD-10-CM

## 2023-06-17 DIAGNOSIS — R55 Syncope and collapse: Secondary | ICD-10-CM | POA: Diagnosis not present

## 2023-06-17 NOTE — Progress Notes (Unsigned)
ZIO AT serial # [id number] from office inventory applied to patient.  Dr. Anne Fu to read.

## 2023-06-17 NOTE — Patient Instructions (Signed)
Medication Instructions:  Your physician recommends that you continue on your current medications as directed. Please refer to the Current Medication list given to you today.  *If you need a refill on your cardiac medications before your next appointment, please call your pharmacy*   Lab Work: None  If you have labs (blood work) drawn today and your tests are completely normal, you will receive your results only by: MyChart Message (if you have MyChart) OR A paper copy in the mail If you have any lab test that is abnormal or we need to change your treatment, we will call you to review the results.   Testing/Procedures: ZIO AT Long term monitor-Live Telemetry  Your physician has requested you wear a ZIO patch monitor for 14 days.  This is a single patch monitor. [employer] supplies one patch monitor per enrollment. Additional  stickers are not available.  Please do not apply patch if you will be having a Nuclear Stress Test, Echocardiogram, Cardiac CT, MRI,  or Chest Xray during the period you would be wearing the monitor. The patch cannot be worn during  these tests. You cannot remove and re-apply the ZIO AT patch monitor.  Your ZIO patch monitor will be mailed 3 day USPS to your address on file. It may take 3-5 days to  receive your monitor after you have been enrolled.  Once you have received your monitor, please review the enclosed instructions. Your monitor has  already been registered assigning a specific monitor serial # to you.   Billing and Patient Assistance Program information  Meredeth Ide has been supplied with any insurance information on record for billing. [employer] offers a sliding scale Patient Assistance Program for patients without insurance, or whose  insurance does not completely cover the cost of the ZIO patch monitor. You must apply for the  Patient Assistance Program to qualify for the discounted rate. To apply, call [employer] at [phone],  select option 4, select  option 2 , ask to apply for the Patient Assistance Program, (you can request an  interpreter if needed). [employer] will ask your household income and how many people are in your  household. [employer] will quote your out-of-pocket cost based on this information. They will also be able  to set up a 12 month interest free payment plan if needed.  Applying the monitor   Shave hair from upper left chest.  Hold the abrader disc by orange tab. Rub the abrader in 40 strokes over left upper chest as indicated in  your monitor instructions.  Clean area with 4 enclosed alcohol pads. Use all pads to ensure the area is cleaned thoroughly. Let  dry.  Apply patch as indicated in monitor instructions. Patch will be placed under collarbone on left side of  chest with arrow pointing upward.  Rub patch adhesive wings for 2 minutes. Remove the white label marked "1". Remove the white label  marked "2". Rub patch adhesive wings for 2 additional minutes.  While looking in a mirror, press and release button in center of patch. A small green light will flash 3-4  times. This will be your only indicator that the monitor has been turned on.  Do not shower for the first 24 hours. You may shower after the first 24 hours.  Press the button if you feel a symptom. You will hear a small click. Record Date, Time and Symptom in  the Patient Log.   Starting the Gateway  In your kit there is a Audiological scientist  box the size of a cellphone. This is Buyer, retail. It transmits all your  recorded data to [facility]. This box must always stay within 10 feet of you. Open the box and push the *  button. There will be a light that blinks orange and then green a few times. When the light stops  blinking, the Gateway is connected to the ZIO patch. Call [employer] at [phone] to confirm your monitor is transmitting.  Returning your monitor  Remove your patch and place it inside the Gateway. In the lower half of the Gateway there is a white   bag with prepaid postage on it. Place Gateway in bag and seal. Mail package back to [place] as soon as  possible. Your physician should have your final report approximately 7 days after you have mailed back  your monitor. Call [facility] Customer Care at [phone] if you have questions regarding your ZIO AT  patch monitor. Call them immediately if you see an orange light blinking on your monitor.  If your monitor falls off in less than 4 days, contact our Monitor department at [phone]. If your  monitor becomes loose or falls off after 4 days call [employer] at [phone] for suggestions on  securing your monitor    Follow-Up: At [facility], you and your health needs are our priority.  As part of our continuing mission to provide you with exceptional heart care, we have created designated Provider Care Teams.  These Care Teams include your primary Cardiologist (physician) and Advanced Practice Providers (APPs -  Physician Assistants and Nurse Practitioners) who all work together to provide you with the care you need, when you need it.  We recommend signing up for the patient portal called "MyChart".  Sign up information is provided on this After Visit Summary.  MyChart is used to connect with patients for Virtual Visits (Telemedicine).  Patients are able to view lab/test results, encounter notes, upcoming appointments, etc.  Non-urgent messages can be sent to your provider as well.   To learn more about what you can do with MyChart, go to [email or web address].    Your next appointment:   2 week(s) with  You may see one of the following Advanced Practice Providers on your designated Care Team:   Francis Dowse, [place] Casimiro Needle "Mardelle Matte" [place], [place] Sherie Don, NP Canary Brim, NP     6 weeks with Jari Favre, PA

## 2023-06-18 ENCOUNTER — Telehealth: Payer: Self-pay | Admitting: Physician Assistant

## 2023-06-18 ENCOUNTER — Telehealth: Payer: Self-pay | Admitting: Cardiology

## 2023-06-18 DIAGNOSIS — I4589 Other specified conduction disorders: Secondary | ICD-10-CM | POA: Diagnosis not present

## 2023-06-18 DIAGNOSIS — R55 Syncope and collapse: Secondary | ICD-10-CM | POA: Diagnosis not present

## 2023-06-18 NOTE — Telephone Encounter (Signed)
Patient states he was called early this morning by [employer] and asked if he had any symptoms this morning related to a report on his monitor from 5:45 am. He asked what this was for and was told they could not discuss it with him and he would need to reach out to our office.  Per event monitor alert from Ronie Spies, PA-C this morning:  Per [employer] rep, patient had 4.1 second pause at 5:53am. Zio team reached out to patient who reported he was sleeping at the time - was having weird dreams but no cardiac symptoms. I will route message to ordering provider Jari Favre who is currently in the office for review. Zio will be faxing strip shortly. Julian Hy also aware via secure chat acknowledgement.   Will forward to Tess to see if she has any further recommendations.  Patient verbalized understanding and expressed appreciation for call.

## 2023-06-18 NOTE — Telephone Encounter (Addendum)
   Cardiac Monitor Alert  Date of alert:  06/18/2023   Patient Name: Edwin [name]  DOB: Oct 05, 1956  MRN: [id number]   [facility] Cardiologist: Donato Schultz, MD  [facility] EP:  None    Monitor Information: Long Term Monitor-Live Telemetry [ZioAT]  Reason:  syncope, AV dissociation Ordering provider:  Jari Favre PA-C   Alert  Per [employer] rep, patient had 4.1 second pause at 5:53am. Zio team reached out to patient who reported he was sleeping at the time - was having weird dreams but no cardiac symptoms. I will route message to ordering provider Jari Favre who is currently in the office for review. Zio will be faxing strip shortly. Julian Hy also aware via secure chat acknowledgement.   Laurann Montana, PA-C  06/18/2023 8:02 AM

## 2023-06-18 NOTE — Telephone Encounter (Signed)
Pt following up on Ziomonitor results that were received last night. Please advise

## 2023-06-18 NOTE — Telephone Encounter (Signed)
Jake Bathe, MD  You; Frutoso Schatz, RN2 days ago    Reviewed hospital consultation from earlier July.  I am not concerned about bradycardia.  I am not concerned about his psychiatric medications causing bradycardia.  Certainly they can cause sedation however.  This should be of no concern.

## 2023-06-28 ENCOUNTER — Other Ambulatory Visit: Payer: Self-pay | Admitting: Psychiatry

## 2023-06-28 ENCOUNTER — Encounter: Payer: Self-pay | Admitting: Student

## 2023-06-28 ENCOUNTER — Ambulatory Visit: Payer: Medicare Other | Attending: Cardiovascular Disease | Admitting: Student

## 2023-06-28 VITALS — BP 120/70 | HR 57 | Ht 70.0 in | Wt 238.0 lb

## 2023-06-28 DIAGNOSIS — Z6837 Body mass index (BMI) 37.0-37.9, adult: Secondary | ICD-10-CM | POA: Diagnosis not present

## 2023-06-28 DIAGNOSIS — F3181 Bipolar II disorder: Secondary | ICD-10-CM

## 2023-06-28 DIAGNOSIS — F3170 Bipolar disorder, currently in remission, most recent episode unspecified: Secondary | ICD-10-CM | POA: Diagnosis not present

## 2023-06-28 DIAGNOSIS — E782 Mixed hyperlipidemia: Secondary | ICD-10-CM

## 2023-06-28 DIAGNOSIS — E1122 Type 2 diabetes mellitus with diabetic chronic kidney disease: Secondary | ICD-10-CM | POA: Diagnosis not present

## 2023-06-28 DIAGNOSIS — E291 Testicular hypofunction: Secondary | ICD-10-CM | POA: Diagnosis not present

## 2023-06-28 DIAGNOSIS — N183 Chronic kidney disease, stage 3 unspecified: Secondary | ICD-10-CM | POA: Diagnosis not present

## 2023-06-28 MED ORDER — ROSUVASTATIN CALCIUM 40 MG PO TABS: 40.0000 mg | ORAL_TABLET | Freq: Every day | ORAL | 3 refills | Status: DC

## 2023-06-28 NOTE — Assessment & Plan Note (Signed)
Assessment and plan: Baseline BMI 34.15 weight 238 lbs lat A1c was well controlled 5.3 (05/30/2023) however due to renal function decline metformin dose as reduced to 500 mg once daily  In the past he had tried Ozempic - was unable to tolerate due to GI related side effects patient was not reducing fat and carb intake  Want to try GLP1 agent again, given hx of stroke, DM and CKD would benefit from weight effective GLP - risk reduction for future MI, stroke.  Will check with insurance whether Ozempic or Mounjaro covered by the plan  Upon approval will let patient know and will schedule appointment to discuss dietary changes to improve GLP1 tolerability

## 2023-06-28 NOTE — Assessment & Plan Note (Signed)
Assessment: BP is controlled in office BP 120/70 mmHg  (goal <130/80) Home BP ~ 135/80 mmHg HR high 50's  Tolerates amlodipine 5 mg well and lasix every other well without any side effects Denies SOB, palpitation, chest pain, headaches,or swelling Does not watch salt intake and exercise regularly 30-60 min walk every day but no resistance exercise Reiterated the importance of regular exercise and low salt diet  Seeing nephrologist for declined kidney function- may restart ACE or ARB when he see him in couple months    Plan:   Continue taking amlodipine 5 mg daily and furosemide 20 mg every other day  Reiterated the importance of regular exercise and low salt diet  Continue checking BP at home report [place] if BP stays above goal  Follow up lab(s): none

## 2023-06-28 NOTE — Patient Instructions (Signed)
Your Results:             Your most recent labs Goal  Total Cholesterol 160  < 200  Triglycerides 45 < 150  HDL (happy/good cholesterol) 46 > 40  LDL (lousy/bad cholesterol 105 < 55   Medication changes:  Start taking Crestor 40 mg daily will repeat fasting lipid lab and liver function test in 12 weeks

## 2023-06-28 NOTE — Progress Notes (Unsigned)
Patient ID: Edwin [name]                 DOB: January 24, 1956                    MRN: [id number]      HPI: Edwin [name] is a 67 y.o. male patient referred to PharmD for medication review by Dr.Skains. PMH is significant for hypertension, CKD, bipolar, diabetes, CVA    Patient has request to see PharmD. He is presented today for medication review. Patient was hospitalized in July for cellulitis, during hospital stay his HR was dropping too low so referred to heart doctor. He has been wearing zio patch where there was one episode of stopping heart for ~4 sec. Patient is wondering any of his current medications causing lower heart rate. We went through the list together and confirmed none of the medications has bradycardia as s/e. 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 He checks his BP at home it averages 135/80 range and heart rate ~ high 50's. Denies any chest pain, SOB, palpitation, swelling. He was on Ozempic but he was not able to tolerate even the starting dose. Due to severe GI related s/e. However patient did not made any dietary changes ( smaller low carb low fat meals) to improve tolerance of GLP1 - reports was not educated for that. He would like to try that again with some diet suggestion. Discussed last lipid panel and discussed statins in detail.    Current BP medications: lasix 20 mg every other day, amlodipine 5 mg daily  BP goal <130/80   Current Medications: none  Intolerances: none  Risk Factors: hypertension, CKD,diabetes, CVA  LDL goal: <55 mg/dl  Last lab: TC 782, TG 45, HDL 46, LDL 105   Diet: lunch till around 7-8 pm  Breakfast: skips Lunch: grilled chicken salads, unsweetened tea  Dinner: fish, beef, rice, lettuce or other green   Exercise: walk - 30-60 min/day  Resistance exercise -none   Family History:  Relation Problem Comments  Mother Arrhythmia   Hypothyroidism   Mitral valve  prolapse   Other Viral Meningitis  Stroke     Father (Deceased) Alzheimer's disease   Gout   Hypertension       Social History:  Alcohol:none  Smoking: never   Labs: Lipid Panel     Component Value Date/Time   CHOL 160 07/01/2022 0352   TRIG 45 07/01/2022 0352   HDL 46 07/01/2022 0352   CHOLHDL 3.5 07/01/2022 0352   VLDL 9 07/01/2022 0352   LDLCALC 105 (H) 07/01/2022 0352    Past Medical History:  Diagnosis Date   Allergic rhinitis    Bipolar disorder (HCC)    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     Cholecalciferol 1000 units TBDP Take 1 tablet by mouth daily.  clopidogrel (PLAVIX) 75 MG tablet TAKE ONE TABLET BY MOUTH DAILY 90 tablet 1   dapagliflozin propanediol (FARXIGA) 10 MG TABS tablet Take 10 mg by mouth daily.     divalproex (DEPAKOTE ER) 500 MG 24 hr tablet Take 2 tablets (1,000 mg total) by mouth at bedtime. 60 tablet 5   DULoxetine (CYMBALTA) 60 MG capsule Take 2 capsules (120 mg total) by mouth daily. 60 capsule 5   febuxostat (ULORIC) 40 MG tablet Take 40 mg by mouth daily.     furosemide (LASIX) 20 MG tablet Take 20 mg by mouth every other day.     gabapentin (NEURONTIN) 400 MG capsule Take 400 mg by mouth 2 (two) times daily.     KERENDIA 10 MG TABS Take 1 tablet by mouth daily.     lamoTRIgine (LAMICTAL) 200 MG tablet TAKE ONE TABLET BY MOUTH IN THE MORNING 30 tablet 0   LevOCARNitine (L-CARNITINE) 500 MG TABS Take 500 mg by mouth 2 (two) times daily.     magnesium oxide (MAG-OX) 400 (240 Mg) MG tablet Take 400 mg by mouth  daily.     metFORMIN (GLUCOPHAGE) 500 MG tablet Take 500 mg by mouth daily with breakfast.     multivitamin-lutein (OCUVITE-LUTEIN) CAPS capsule Take 1 capsule by mouth daily.     pramipexole (MIRAPEX) 0.5 MG tablet Take 1 tablet (0.5 mg total) by mouth 2 (two) times daily. 60 tablet 5   tamsulosin (FLOMAX) 0.4 MG CAPS capsule Take 0.4 mg by mouth daily.     VELTASSA 8.4 g packet Take 8.4 g by mouth See admin instructions. Take one packet by mouth three times a week. Days vary.     pregabalin (LYRICA) 25 MG capsule TAKE ONE CAPSULE BY MOUTH TWICE DAILY 30 capsule 0   No current facility-administered medications on file prior to visit.    Allergies  Allergen Reactions   Allopurinol Other (See Comments)    Made pt emotionally unstable  Other reaction(s): emotionally labile   Penicillins Hives and Other (See Comments)   Finerenone Other (See Comments)    hyperkalemia Other reaction(s): Increases  potassium levels   Penicillin G     Other reaction(s): hives   Aspirin Hives   Ciprofloxacin Rash    Other reaction(s): rash    Assessment/Plan:  1. Hyperlipidemia -  Problem  Body Mass Index (Bmi) 37.0-37.9, Adult  Hyperglycemia Due to Type 2 Diabetes Mellitus (Hcc)  Hypertension   Hypertension Assessment: BP is controlled in office BP 120/70 mmHg  (goal <130/80) Home BP ~ 135/80 mmHg HR high 50's  Tolerates amlodipine 5 mg well and lasix every other well without any side effects Denies SOB, palpitation, chest pain, headaches,or swelling Does not watch salt intake and exercise regularly 30-60 min walk every day but no resistance exercise Reiterated the importance of regular exercise and low salt diet  Seeing nephrologist for declined kidney function- may restart ACE or ARB when he see him in couple months    Plan:   Continue taking amlodipine 5 mg daily and furosemide 20 mg every other day  Reiterated the importance of regular exercise and low salt diet  Continue checking BP at  home report [place] if BP stays above goal  Follow up lab(s): none    Hyperglycemia due to type 2 diabetes mellitus (HCC) Assessment:  LDL goal: <55 mg/dl last LDLc 782 mg/dl (95/62/1308) Currently not on any mediations  Hx of CVA, CKD and diabetes is under controlled by metformin ( recently dose  reduced)   We discussed statins in detail and importance of lowering LDLc to below goal  Plan: Will start high intensity statin - Crestor 40 mg daily  Follow up lab: FLP and LFT in 12 weeks  In future if LDLc still elevated above the goal or intolerance to statins may consider other statin or  PCSK9i or ezetimibe   Body mass index (BMI) 37.0-37.9, adult Assessment and plan: Baseline BMI 34.15 weight 238 lbs lat A1c was well controlled 5.3 (05/30/2023) however due to renal function decline metformin dose as reduced to 500 mg once daily  In the past he had tried Ozempic - was unable to tolerate due to GI related side effects patient was not reducing fat and carb intake  Want to try GLP1 agent again, given hx of stroke, DM and CKD would benefit from weight effective GLP - risk reduction for future MI, stroke.  Will check with insurance whether Ozempic or Mounjaro covered by the plan  Upon approval will let patient know and will schedule appointment to discuss dietary changes to improve GLP1 tolerability     Thank you,  Carmela Hurt, Pharm.D [facility] A Division of [facility] [address] [place], [place] [address]  Phone: [phone]; Fax: [phone]

## 2023-06-28 NOTE — Assessment & Plan Note (Addendum)
Assessment:  LDL goal: <55 mg/dl last LDLc 782 mg/dl (95/62/1308) Currently not on any mediations  Hx of CVA, CKD and diabetes is under controlled by metformin ( recently dose reduced)   We discussed statins in detail and importance of lowering LDLc to below goal  Plan: Will start high intensity statin - Crestor 40 mg daily  Follow up lab: FLP and LFT in 12 weeks  In future if LDLc still elevated above the goal or intolerance to statins may consider other statin or  PCSK9i or ezetimibe

## 2023-06-30 NOTE — Progress Notes (Unsigned)
Cardiology Office Note Date:  07/02/2023  Patient ID:  Edwin [name], Edwin [name] 10/01/1956, MRN [id number] PCP:  Deatra James, MD  Cardiologist:  Dr. Anne Fu Electrophysiologist: none    Chief Complaint: abnormal monitor  History of Present Illness: Edwin [name] is a 67 y.o. male with history of HTN, bipolar d/o, CKD (II), OSA (intolerant of CPAP), stroke, seizures.  He has hx of AV dissociation apparently (discussion goes back to 2015) felt 2/2 lithium. Hx of diltiazem for HTN management discontinued 2/2 bradycardia Appears at/about the same time Also noted to be discussed back in 2015  He saw Dr. Anne Fu last 12/31/22, reported feeling better on his current medication regime  Hospitalized July for LE cellulitis, had SB noted, 1st degree AVBlock, saw by cardiology team with reports of progressive fatigue.  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  Remained with poor compliance on OSA treatment, sounds like he pursued dental appliance, though poorly tolerated as well. Working on perhaps [employer] for monitoring and ASAP referral to EP  A monitor alert cam beck with a nocturnal pause (4.1 seconds) while asleep.     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 He has never collapse to the ground Has never happened when active, physically playing, when standing, or  ambulating. Has happened when relaxed in a chair, waiting TV, seated at a table Has never fallen to the floor/gotten hurt.  No CP, palpitations, SOB with/without the episodes.  He is using the dental appliance, tells me that at his last visit with Dr. Vassie Loll, or a recent visit, that his apnea was improved to 12 per minute when laying on his side, he understood worse when supine, but isn't sure. He is not inclined to want to pursue the inspire device, unclear why.   Past Medical History:  Diagnosis Date   Allergic rhinitis    Bipolar disorder (HCC)    Bradycardia 2012    due to lithium   CKD (chronic kidney disease) stage 2, GFR 60-89 ml/min    Gout    Hypertension    Mild renal insufficiency    , with creatinine of 1.3 in 2010, was side effect of med   Obesity    ,moderate   Prostatitis    , Episodic   Stroke [facility])    Suicide attempt (HCC) 09/20/2014    Past Surgical History:  Procedure Laterality Date   APPENDECTOMY     CATARACT EXTRACTION Right    COLONOSCOPY WITH PROPOFOL N/A 06/14/2014   Procedure: COLONOSCOPY WITH PROPOFOL;  Surgeon: Charolett Bumpers, MD;  Location: [facility];  Service: Endoscopy;  Laterality: N/A;   TONSILLECTOMY     UMBILICAL HERNIA REPAIR      Current Outpatient Medications  Medication Sig Dispense Refill   amLODipine (NORVASC) 5 MG tablet Take 5 mg by  mouth daily.     Cholecalciferol 1000 units TBDP Take 1 tablet by mouth daily.      clopidogrel (PLAVIX) 75 MG tablet TAKE ONE TABLET BY MOUTH DAILY 90 tablet 1   dapagliflozin propanediol (FARXIGA) 10 MG TABS tablet Take 10 mg by mouth daily.     divalproex (DEPAKOTE ER) 500 MG 24 hr tablet Take 2 tablets (1,000 mg total) by mouth at bedtime. 60 tablet 5   DULoxetine (CYMBALTA) 60 MG capsule Take 2 capsules (120 mg total) by mouth daily. 60 capsule 5   febuxostat (ULORIC) 40 MG tablet Take 40 mg by mouth daily.     furosemide (LASIX) 20 MG tablet Take 20 mg by mouth every other day.      gabapentin (NEURONTIN) 400 MG capsule Take 400 mg by mouth 2 (two) times daily.     KERENDIA 10 MG TABS Take 1 tablet by mouth daily.     lamoTRIgine (LAMICTAL) 200 MG tablet TAKE ONE TABLET BY MOUTH IN THE MORNING 90 tablet 0   LevOCARNitine (L-CARNITINE) 500 MG TABS Take 500 mg by mouth 2 (two) times daily.     magnesium oxide (MAG-OX) 400 (240 Mg) MG tablet Take 400 mg by mouth daily.     multivitamin-lutein (OCUVITE-LUTEIN) CAPS capsule Take 1 capsule by mouth daily.     pramipexole (MIRAPEX) 0.5 MG tablet Take 1 tablet (0.5 mg total) by mouth 2 (two) times daily. 60 tablet 5   pregabalin (LYRICA) 25 MG capsule TAKE ONE CAPSULE BY MOUTH TWICE DAILY 30 capsule 0   rosuvastatin (CRESTOR) 40 MG tablet Take 1 tablet (40 mg total) by mouth daily. 90 tablet 3   tamsulosin (FLOMAX) 0.4 MG CAPS capsule Take 0.4 mg by mouth daily.     VELTASSA 8.4 g packet Take 8.4 g by mouth See admin instructions. Take one packet by mouth three times a week. Days vary.     metFORMIN (GLUCOPHAGE) 500 MG tablet Take 500 mg by mouth daily with breakfast. (Patient not taking: Reported on 07/02/2023)     No current facility-administered medications for this visit.    Allergies:   Allopurinol, Penicillins, Finerenone, Penicillin g, Aspirin, and Ciprofloxacin   Social History:  The patient  reports that he has never smoked. He has never been exposed to tobacco smoke. He has never used smokeless tobacco. He reports that he does not drink alcohol and does not use drugs.   Family History:  The patient's family history includes Alzheimer's disease in his father; Arrhythmia in his mother; Gout in his father; Hypertension in his father; Hypothyroidism in his mother; Mitral valve prolapse in his mother; Other in his mother; Stroke in his mother.  ROS:  Please see the history of present illness.    All other systems are reviewed and otherwise negative.   PHYSICAL EXAM:  VS:  BP 120/88 (BP Location: Left Arm, Patient Position:  Sitting, Cuff Size: Large)   Pulse 60   Ht 5\' 10"  (1.778 m)   Wt 244 lb 6.4 oz (110.9 kg)   SpO2 96%   BMI 35.07 kg/m  BMI: Body mass index is 35.07 kg/m. Well nourished, well developed, in no acute distress HEENT: normocephalic, atraumatic Neck: no JVD, carotid bruits or masses Cardiac:  RRR; no significant murmurs, no rubs, or gallops Lungs:  CTA b/l, no wheezing, rhonchi or rales Abd: soft, nontender MS: no deformity or atrophy Ext: no edema Skin: warm and dry, no rash Neuro:  No gross deficits appreciated Psych: euthymic  mood, full affect   EKG:  not done today  05/30/23: TTE 1. Left ventricular ejection fraction, by estimation, is 60 to 65%. The  left ventricle has normal function. The left ventricle has no regional  wall motion abnormalities. Left ventricular diastolic parameters are  consistent with Grade I diastolic  dysfunction (impaired relaxation).   2. Right ventricular systolic function is normal. The right ventricular  size is mildly enlarged. Tricuspid regurgitation signal is inadequate for  assessing PA pressure.   3. The mitral valve is normal in structure. Trivial mitral valve  regurgitation. No evidence of mitral stenosis.   4. The aortic valve is tricuspid. Aortic valve regurgitation is not  visualized. Aortic valve sclerosis is present, with no evidence of aortic  valve stenosis.   5. Aortic dilatation noted. There is borderline dilatation of the  ascending aorta, measuring 38 mm.   6. The inferior vena cava is normal in size with greater than 50%  respiratory variability, suggesting right atrial pressure of 3 mmHg.   Comparison(s): No significant change from prior study.   Recent Labs: 05/30/2023: Magnesium 2.5; TSH 1.405 06/08/2023: ALT 20; B Natriuretic Peptide 66.7; BUN 35; Creatinine, Ser 1.72; Hemoglobin 12.1; Platelets 216; Potassium 4.4; Sodium 139  No results found for requested labs within last 365 days.   CrCl cannot be calculated  (Patient's most recent lab result is older than the maximum 21 days allowed.).   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  The second he said, never really seemed to work  Neither have been read officially  I have reviewed all available tracings (both Zio AT's) He has had some transient accelerated junctional rates, not associated with bradycardia He has had pause as above associated with sleep And some transient bradycardic rates at 3AM The second with marked artifact, loss of signal  Pt triggered strips associated with HRs 50's-60's  It sounds like he is drifting to sleep, more likely 2/2 his under treated apnea .vs.  perhaps polypharmacy  No significant bradycardia noted during awake hours Reviewed with Dr,. Ladona Ridgel the case, last EKG,  patient symptoms, and tracings noting the above Agrees no indication for PPM He will follow up in a few months hopefully with better treated apnea and review symptoms   Suspect symptoms likely multifactorial/due to:  3. ? under treated OSA Advised f/u with Dr. Vassie Loll for re-evaluation of apnea with what sounds like daytime sleepiness, falling asleep, has also gained some weight back ? Reconsider inspire/alternative tx for his apnea  4. Perhaps 2/2 his meds  Discussed recommended to see his PMD/neurologist/psychiatrist see if there is any med reduction that could be done, noting on a few meds that may be causing drowsiness as well.   I have advised he not drive until revisiting with his PMD and pulmonologist and symptoms  resolve    Disposition: F/u as above, sooner if needed  Current medicines are reviewed at length with the patient today.  The patient did not have any concerns regarding medicines.  Norma Fredrickson, PA-C 07/02/2023 1:25 PM  [facility] [address] [place] [address] [phone] (office)  [phone] (fax)

## 2023-07-02 ENCOUNTER — Encounter: Payer: Self-pay | Admitting: Physician Assistant

## 2023-07-02 ENCOUNTER — Other Ambulatory Visit (HOSPITAL_COMMUNITY): Payer: Self-pay

## 2023-07-02 ENCOUNTER — Ambulatory Visit: Payer: Medicare Other | Attending: Physician Assistant | Admitting: Physician Assistant

## 2023-07-02 ENCOUNTER — Telehealth: Payer: Self-pay

## 2023-07-02 ENCOUNTER — Telehealth: Payer: Self-pay | Admitting: Student

## 2023-07-02 ENCOUNTER — Telehealth: Payer: Self-pay | Admitting: Pharmacist

## 2023-07-02 VITALS — BP 120/88 | HR 60 | Ht 70.0 in | Wt 244.4 lb

## 2023-07-02 DIAGNOSIS — R001 Bradycardia, unspecified: Secondary | ICD-10-CM | POA: Diagnosis not present

## 2023-07-02 DIAGNOSIS — E291 Testicular hypofunction: Secondary | ICD-10-CM | POA: Diagnosis not present

## 2023-07-02 NOTE — Telephone Encounter (Signed)
Patient provider A1C info as requested when he came in for appointment this morning.   July '22 - 5.8 Nov '22 - 5.0 April '23 - 5.8 Dec '23 - 5.2 July '24 - 5.3

## 2023-07-02 NOTE — Telephone Encounter (Signed)
Pharmacy Patient Advocate Encounter   Received notification from Physician's Office that prior authorization for [facility] is required/requested.   Insurance verification completed.   The patient is insured through [facility]  .   Per test claim: PA required; PA submitted to [employer] MEDICARE via CoverMyMeds Key/confirmation #/EOC [id number] Status is pending

## 2023-07-02 NOTE — Telephone Encounter (Signed)
Very likely. I will submit a PA now.

## 2023-07-02 NOTE — Patient Instructions (Signed)
Medication Instructions:  . Your physician recommends that you continue on your current medications as directed. Please refer to the Current Medication list given to you today.  *If you need a refill on your cardiac medications before your next appointment, please call your pharmacy*   Lab Work: NONE ORDERED  TODAY   If you have labs (blood work) drawn today and your tests are completely normal, you will receive your results only by: MyChart Message (if you have MyChart) OR A paper copy in the mail If you have any lab test that is abnormal or we need to change your treatment, we will call you to review the results.   Testing/Procedures:  NONE ORDERED  TODAY    Follow-Up: At [facility], you and your health needs are our priority.  As part of our continuing mission to provide you with exceptional heart care, we have created designated Provider Care Teams.  These Care Teams include your primary Cardiologist (physician) and Advanced Practice Providers (APPs -  Physician Assistants and Nurse Practitioners) who all work together to provide you with the care you need, when you need it.  We recommend signing up for the patient portal called "MyChart".  Sign up information is provided on this After Visit Summary.  MyChart is used to connect with patients for Virtual Visits (Telemedicine).  Patients are able to view lab/test results, encounter notes, upcoming appointments, etc.  Non-urgent messages can be sent to your provider as well.   To learn more about what you can do with MyChart, go to [email or web address].    Your next appointment:   3 month(s) ( CONTACT EP SCHEDULING )  Provider:   Lewayne Bunting, MD   Other Instructions

## 2023-07-03 ENCOUNTER — Other Ambulatory Visit (HOSPITAL_COMMUNITY): Payer: Self-pay

## 2023-07-03 NOTE — Telephone Encounter (Signed)
Pharmacy Patient Advocate Encounter  Received notification from [facility] MEDICARE  that Prior Authorization for [facility] has been APPROVED from 07/02/23 to 07/01/24. Ran test claim, Copay is $249, PT HAS A HIGH COPAY DUE TO DEDUCTIBLE NOT YET MET. This test claim was processed through [facility]- copay amounts may vary at other pharmacies due to pharmacy/plan contracts, or as the patient moves through the different stages of their insurance plan.

## 2023-07-04 MED ORDER — WEGOVY 0.25 MG/0.5ML ~~LOC~~ SOAJ: 0.2500 mg | SUBCUTANEOUS | 0 refills | Status: DC

## 2023-07-04 NOTE — Telephone Encounter (Signed)
Call patient to inform about PA approval. Patient will be seeing nutritionist Aug 21 will start using [facility] after that 1st consultation.  Will call around 23 Sept for dose titration.

## 2023-07-04 NOTE — Addendum Note (Signed)
Addended by: Tylene Fantasia on: 07/04/2023 02:56 PM   Modules accepted: Orders

## 2023-07-08 ENCOUNTER — Telehealth: Payer: Self-pay | Admitting: Pulmonary Disease

## 2023-07-08 ENCOUNTER — Encounter (HOSPITAL_BASED_OUTPATIENT_CLINIC_OR_DEPARTMENT_OTHER): Payer: Self-pay | Admitting: Pulmonary Disease

## 2023-07-08 ENCOUNTER — Ambulatory Visit (HOSPITAL_BASED_OUTPATIENT_CLINIC_OR_DEPARTMENT_OTHER): Payer: Medicare Other | Admitting: Pulmonary Disease

## 2023-07-08 VITALS — BP 128/80 | HR 71 | Resp 17 | Ht 70.0 in | Wt 240.2 lb

## 2023-07-08 DIAGNOSIS — G4733 Obstructive sleep apnea (adult) (pediatric): Secondary | ICD-10-CM

## 2023-07-08 DIAGNOSIS — R400 Somnolence: Secondary | ICD-10-CM

## 2023-07-08 NOTE — Telephone Encounter (Signed)
DISE has been scheduled with endoscopy for 8/30 at 7:30 AM Please let patient and inspire rep know

## 2023-07-08 NOTE — Patient Instructions (Signed)
X we will plan for sleep endoscopy then refer you to ENT For inspire implantation

## 2023-07-08 NOTE — Assessment & Plan Note (Signed)
Confounding issue related to his hypersomnolence are his medications for depression and mood including valproate, gabapentin, Lamictal and Mirapex all of which may contribute to his somnolence. I discussed this issue with him.  He will discuss with his psychiatrist further and a stimulant such as modafinil may be of some benefit.  It does seem like he needs his antidepressants at current doses

## 2023-07-08 NOTE — Assessment & Plan Note (Signed)
He had moderate to severe OSA.  He was unable to tolerate CPAP therapy.  His BMI is less than 35. As such he qualifies for hypoglossal nerve stimulator therapy.  We discussed pros and cons of this method of treating OSA.  He is willing to proceed. We will schedule sleep endoscopy and then refer him to ENT. Meanwhile he will continue sleeping with his oral appliance and try positional therapy

## 2023-07-08 NOTE — Progress Notes (Signed)
   Subjective:    Patient ID: Edwin [name], male    DOB: Sep 13, 1956, 67 y.o.   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 Patient has  questions about the implant.  Patient is concerned about having another stroke.  Patient is concerned about not getting rest from sleep.  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    He was unable to tolerate CPAP therapy.  With dental appliance and positional therapy, AHI was lower at 17/hour. 41-month follow-up visit today. Continues to complain of increased sleepiness during his piano lessons and sometimes even when driving.  He has been using his dental appliance.  He remains concerned about his risk for future stroke. His wife from [place] was able to emigrate and is now with him. He has not started on Wegovy yet.  Weight is up 10 pounds from his last visit but BMI is at  34.5  Significant tests/ events reviewed   CT abdomen/pelvis 01/2017 mild atelectasis and bronchiectasis of both lung bases   02/2018 HST [facility] -AHI 53/hour, low saturation 58%   06/2022 HST [neurology]-pAHI 26/hour, low sat 80%, prone AHI 24/hour, supine AHI 15/hour, left side 2.6/hour , 70 minutes   02/2023 HST (with dental appliance] showed moderate  OSA with AHI 17/ hr , TST <3h   ONO on room air 09/20/2022 -total recording time 3 hours 16 minutes, saturation less than 88% for 1 hour  Review of Systems neg for any significant sore throat, dysphagia, itching, sneezing, nasal congestion or excess/ purulent secretions, fever, chills, sweats, unintended wt loss, pleuritic or exertional cp, hempoptysis, orthopnea pnd or change in chronic leg swelling. Also denies presyncope, palpitations, heartburn, abdominal pain, nausea, vomiting,  diarrhea or change in bowel or urinary habits, dysuria,hematuria, rash, arthralgias, visual complaints, headache, numbness weakness or ataxia.     Objective:   Physical Exam  Gen. Pleasant, obese, in no distress ENT - no lesions, no post nasal drip Neck: No JVD, no thyromegaly, no carotid bruits Lungs: no use of accessory muscles, no dullness to percussion, decreased without rales or rhonchi  Cardiovascular: Rhythm regular, heart sounds  normal, no murmurs or gallops, no peripheral edema Musculoskeletal: No deformities, no cyanosis or clubbing , no tremors       Assessment & Plan:

## 2023-07-10 ENCOUNTER — Encounter (HOSPITAL_BASED_OUTPATIENT_CLINIC_OR_DEPARTMENT_OTHER): Payer: Self-pay | Admitting: Pulmonary Disease

## 2023-07-11 ENCOUNTER — Encounter: Payer: Self-pay | Admitting: Adult Health

## 2023-07-11 DIAGNOSIS — R202 Paresthesia of skin: Secondary | ICD-10-CM

## 2023-07-16 ENCOUNTER — Encounter: Payer: Self-pay | Admitting: Orthopaedic Surgery

## 2023-07-16 ENCOUNTER — Telehealth: Payer: Self-pay | Admitting: Neurology

## 2023-07-16 ENCOUNTER — Ambulatory Visit: Payer: Medicare Other | Admitting: Orthopaedic Surgery

## 2023-07-16 ENCOUNTER — Encounter (HOSPITAL_BASED_OUTPATIENT_CLINIC_OR_DEPARTMENT_OTHER): Payer: Self-pay | Admitting: Pulmonary Disease

## 2023-07-16 DIAGNOSIS — M1711 Unilateral primary osteoarthritis, right knee: Secondary | ICD-10-CM | POA: Diagnosis not present

## 2023-07-16 DIAGNOSIS — M1712 Unilateral primary osteoarthritis, left knee: Secondary | ICD-10-CM

## 2023-07-16 NOTE — Addendum Note (Signed)
Addended by: Wendi Maya on: 07/16/2023 02:38 PM   Modules accepted: Orders

## 2023-07-16 NOTE — Progress Notes (Signed)
Office Visit Note   Patient: Edwin [name]           Date of Birth: 28-Feb-1956           MRN: [id number] Visit Date: 07/16/2023              Requested by: Deatra James, MD [phone] [place],  [place] [address] PCP: Deatra James, MD   Assessment & Plan: Visit Diagnoses:  1. Primary osteoarthritis of right knee   2. Primary osteoarthritis of left knee     Plan: Impression is chronic bilateral knee pain.  He has tried cortisone injections as well as physical therapy he has not noticed significant relief.  I will order MRIs at this point to rule out structural abnormalities.  Follow-up after the MRIs.  Follow-Up Instructions: No follow-ups on file.   Orders:  No orders of the defined types were placed in this encounter.  No orders of the defined types were placed in this encounter.     Procedures: No procedures performed   Clinical Data: No additional findings.   Subjective: Chief Complaint  Patient presents with   Right Knee - Pain   Left Knee - Pain    HPI Mr. Krumholz is a 67 year old gentleman who returns today for bilateral knee pain.  His pain is worse during sit to stand transition. Review of Systems   Objective: Vital Signs: There were no vitals taken for this visit.  Physical Exam  Ortho Exam Examination bilateral knees unchanged. Specialty Comments:  No specialty comments available.  Imaging: No results found.   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   Allergic rhinitis 02/22/2022   Bipolar disorder in remission (HCC) 02/22/2022   Body mass index (BMI) 37.0-37.9, adult 02/22/2022   Bradycardia 02/22/2022   Cat scratch of forearm  02/22/2022   Cerebral infarction (HCC) 02/22/2022   Closed fracture of metatarsal bone 02/22/2022   Constipation 02/22/2022   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   Dyslipidemia 02/22/2022   Edema 02/22/2022   Electrocardiogram abnormal 02/22/2022   Elevated PSA 02/22/2022   Enthesopathy 02/22/2022   Erectile dysfunction 02/22/2022   History of small bowel obstruction 02/22/2022   Hyperglycemia 02/22/2022   Hyperglycemia due to type 2 diabetes mellitus (HCC) 02/22/2022   Insomnia 02/22/2022   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   Impairment of balance 02/22/2022   Major depression, single episode 02/22/2022   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   Melena 02/22/2022   Mixed hyperlipidemia 02/22/2022   Obstructive sleep apnea syndrome 02/22/2022   Other long term (current) drug therapy 02/22/2022   Encounter for immunization 02/22/2022   Right lower quadrant pain 02/22/2022   Severe recurrent major depression without psychotic features (HCC) 02/22/2022   Umbilical hernia 02/22/2022   Vitamin D deficiency 02/22/2022   Left-sided weakness 02/04/2022   Diabetes mellitus type 2 in nonobese (HCC) 02/04/2022   CVA (cerebral vascular accident) (HCC) 02/04/2022   Enteritis of ileum 02/02/2017   Intestinal obstruction (HCC) 02/02/2017   Persistent recurrent vomiting 02/01/2017   Chronic kidney disease, stage 3 unspecified (HCC) 05/19/2016   Abdominal pain 05/19/2016   Hypokalemia 05/19/2016   Primary gout    Bipolar  affective disorder, currently depressed, moderate (HCC) 09/20/2014   Hypertension 11/23/2013   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   Past Medical History:  Diagnosis Date   Allergic rhinitis    Bipolar disorder (HCC)    Bradycardia 2012    due to lithium   CKD (chronic kidney disease) stage 2, GFR 60-89 ml/min    Gout    Hypertension    Mild renal insufficiency    , with creatinine of 1.3 in 2010, was side effect of med   Obesity     ,moderate   Prostatitis    , Episodic   Stroke (HCC)    Suicide attempt (HCC) 09/20/2014    Family History  Problem Relation Age of Onset   Other Mother        Viral Meningitis   Mitral valve prolapse Mother    Arrhythmia Mother    Hypothyroidism Mother    Stroke Mother    Hypertension Father    Gout Father    Alzheimer's disease Father     Past Surgical History:  Procedure Laterality Date   APPENDECTOMY     CATARACT EXTRACTION Right    COLONOSCOPY WITH PROPOFOL N/A 06/14/2014   Procedure: COLONOSCOPY WITH PROPOFOL;  Surgeon: Charolett Bumpers, MD;  Location: [facility];  Service: Endoscopy;  Laterality: N/A;   TONSILLECTOMY     UMBILICAL HERNIA REPAIR     Social History   Occupational History   Not on file  Tobacco Use   Smoking status: Never    Passive exposure: Never   Smokeless tobacco: Never  Substance and Sexual Activity   Alcohol use: No   Drug use: No   Sexual activity: Not on file

## 2023-07-16 NOTE — Telephone Encounter (Signed)
[facility], [employer] over a clearance form for Dr. Pearlean Brownie through [facility]. Pt having drug induced procedure on 07/19/23.

## 2023-07-16 NOTE — Telephone Encounter (Signed)
Returned Byng from [facility] call and left a message to fax over Clearance form for the patient @ [phone]. We haven't seen a clearance form faxed or in Epic.

## 2023-07-17 NOTE — Telephone Encounter (Signed)
This has been cancelled.  I will notify [employer].

## 2023-07-17 NOTE — Telephone Encounter (Signed)
Spoke to the NP Butch Penny, who received a letter in epic to complete. She is completing for the patient and returning to them via epic

## 2023-07-17 NOTE — Telephone Encounter (Signed)
Please cancel endoscopy per Dr Vassie Loll.

## 2023-07-19 ENCOUNTER — Encounter (HOSPITAL_COMMUNITY): Payer: Self-pay

## 2023-07-19 ENCOUNTER — Ambulatory Visit (HOSPITAL_COMMUNITY): Admit: 2023-07-19 | Payer: Medicare Other | Admitting: Pulmonary Disease

## 2023-07-19 SURGERY — DRUG INDUCED SLEEP ENDOSCOPY
Anesthesia: Monitor Anesthesia Care

## 2023-07-21 ENCOUNTER — Encounter: Payer: Self-pay | Admitting: Adult Health

## 2023-07-21 DIAGNOSIS — Z789 Other specified health status: Secondary | ICD-10-CM

## 2023-07-21 DIAGNOSIS — G4719 Other hypersomnia: Secondary | ICD-10-CM

## 2023-07-21 DIAGNOSIS — R400 Somnolence: Secondary | ICD-10-CM

## 2023-07-21 DIAGNOSIS — G4733 Obstructive sleep apnea (adult) (pediatric): Secondary | ICD-10-CM

## 2023-07-24 MED ORDER — PREGABALIN 25 MG PO CAPS
25.0000 mg | ORAL_CAPSULE | Freq: Two times a day (BID) | ORAL | 1 refills | Status: DC
Start: 2023-07-24 — End: 2023-07-29

## 2023-07-24 NOTE — Addendum Note (Signed)
Addended by: Bertram Savin on: 07/24/2023 07:43 AM   Modules accepted: Orders

## 2023-07-25 ENCOUNTER — Ambulatory Visit: Payer: Medicare Other | Admitting: Physician Assistant

## 2023-07-25 ENCOUNTER — Encounter: Payer: Self-pay | Admitting: Orthopaedic Surgery

## 2023-07-29 ENCOUNTER — Ambulatory Visit (HOSPITAL_BASED_OUTPATIENT_CLINIC_OR_DEPARTMENT_OTHER): Payer: Medicare Other | Admitting: Pulmonary Disease

## 2023-07-29 ENCOUNTER — Other Ambulatory Visit: Payer: Self-pay | Admitting: *Deleted

## 2023-07-29 NOTE — Addendum Note (Signed)
Addended by: Bertram Savin on: 07/29/2023 05:05 PM   Modules accepted: Orders

## 2023-07-29 NOTE — Telephone Encounter (Signed)
That's fine

## 2023-07-29 NOTE — Telephone Encounter (Signed)
Referral placed to [facility].

## 2023-07-29 NOTE — Addendum Note (Signed)
Addended by: Bertram Savin on: 07/29/2023 10:39 AM   Modules accepted: Orders

## 2023-07-30 ENCOUNTER — Institutional Professional Consult (permissible substitution) (INDEPENDENT_AMBULATORY_CARE_PROVIDER_SITE_OTHER): Payer: Medicare Other | Admitting: Otolaryngology

## 2023-07-30 DIAGNOSIS — E291 Testicular hypofunction: Secondary | ICD-10-CM | POA: Diagnosis not present

## 2023-07-31 NOTE — Telephone Encounter (Signed)
I called the patient and discussed.  He states he has never had a seizure that he knows of nor has he ever been told that he had a seizure.  He states the paresthesias came after the stroke and he was prescribed gabapentin for them.  He has remained on gabapentin 400 mg twice a day and when he saw Dr. Pearlean Brownie he was prescribed Topamax in addition which did not help and/or worsened the paresthesias.  He then was switched from Topiramate to Lyrica by Dr. Pearlean Brownie which he noticed the biggest difference with. He had recently requested to increase the Lyrica dose back to 50 mg but he hasn't picked it up from the pharmacy yet and is on 25 mg. He does feel the gabapentin helps but he agreed that if he is advised to stop gabapentin and continue with Lyrica he will try that and then let us know how he does.  He states that overall he has noticed an improvement over time in his left sided paresthesias.  He mainly notices it in his hand but he states the feeling is coming back.  He denies having any side effects from the Lyrica and gabapentin.  He has noticed drowsiness but he states this has been an ongoing issue for years, way before gabapentin.  He had a hospitalization for cellulitis this year and was on IV antibiotics.  He states he was told he " kept passing out" from issues with heart rhythm but after they put him on a heart monitor they stated he was okay.  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

## 2023-08-02 DIAGNOSIS — N183 Chronic kidney disease, stage 3 unspecified: Secondary | ICD-10-CM | POA: Diagnosis not present

## 2023-08-02 DIAGNOSIS — L03116 Cellulitis of left lower limb: Secondary | ICD-10-CM | POA: Diagnosis not present

## 2023-08-02 DIAGNOSIS — R600 Localized edema: Secondary | ICD-10-CM | POA: Diagnosis not present

## 2023-08-08 ENCOUNTER — Inpatient Hospital Stay (HOSPITAL_COMMUNITY)
Admission: EM | Admit: 2023-08-08 | Discharge: 2023-08-11 | DRG: 872 | Disposition: A | Discharge status: Home / Self Care | Payer: Medicare Other | Attending: Internal Medicine | Admitting: Internal Medicine

## 2023-08-08 ENCOUNTER — Ambulatory Visit (INDEPENDENT_AMBULATORY_CARE_PROVIDER_SITE_OTHER): Payer: Medicare Other | Admitting: Physician Assistant

## 2023-08-08 ENCOUNTER — Encounter (HOSPITAL_BASED_OUTPATIENT_CLINIC_OR_DEPARTMENT_OTHER): Payer: Self-pay | Admitting: Physician Assistant

## 2023-08-08 VITALS — Temp 102.0°F

## 2023-08-08 DIAGNOSIS — Z6835 Body mass index (BMI) 35.0-35.9, adult: Secondary | ICD-10-CM | POA: Diagnosis not present

## 2023-08-08 DIAGNOSIS — F319 Bipolar disorder, unspecified: Secondary | ICD-10-CM | POA: Diagnosis present

## 2023-08-08 DIAGNOSIS — Z886 Allergy status to analgesic agent status: Secondary | ICD-10-CM

## 2023-08-08 DIAGNOSIS — N1832 Chronic kidney disease, stage 3b: Secondary | ICD-10-CM | POA: Diagnosis present

## 2023-08-08 DIAGNOSIS — Z7985 Long-term (current) use of injectable non-insulin antidiabetic drugs: Secondary | ICD-10-CM

## 2023-08-08 DIAGNOSIS — L539 Erythematous condition, unspecified: Secondary | ICD-10-CM | POA: Diagnosis not present

## 2023-08-08 DIAGNOSIS — Z8249 Family history of ischemic heart disease and other diseases of the circulatory system: Secondary | ICD-10-CM

## 2023-08-08 DIAGNOSIS — R6520 Severe sepsis without septic shock: Secondary | ICD-10-CM | POA: Diagnosis present

## 2023-08-08 DIAGNOSIS — E875 Hyperkalemia: Secondary | ICD-10-CM | POA: Diagnosis not present

## 2023-08-08 DIAGNOSIS — E119 Type 2 diabetes mellitus without complications: Secondary | ICD-10-CM

## 2023-08-08 DIAGNOSIS — M109 Gout, unspecified: Secondary | ICD-10-CM | POA: Diagnosis present

## 2023-08-08 DIAGNOSIS — Z881 Allergy status to other antibiotic agents status: Secondary | ICD-10-CM

## 2023-08-08 DIAGNOSIS — E785 Hyperlipidemia, unspecified: Secondary | ICD-10-CM | POA: Diagnosis present

## 2023-08-08 DIAGNOSIS — M170 Bilateral primary osteoarthritis of knee: Secondary | ICD-10-CM | POA: Diagnosis present

## 2023-08-08 DIAGNOSIS — Z7984 Long term (current) use of oral hypoglycemic drugs: Secondary | ICD-10-CM | POA: Diagnosis not present

## 2023-08-08 DIAGNOSIS — I10 Essential (primary) hypertension: Secondary | ICD-10-CM | POA: Diagnosis present

## 2023-08-08 DIAGNOSIS — Z8673 Personal history of transient ischemic attack (TIA), and cerebral infarction without residual deficits: Secondary | ICD-10-CM | POA: Diagnosis not present

## 2023-08-08 DIAGNOSIS — N400 Enlarged prostate without lower urinary tract symptoms: Secondary | ICD-10-CM | POA: Diagnosis present

## 2023-08-08 DIAGNOSIS — E1122 Type 2 diabetes mellitus with diabetic chronic kidney disease: Secondary | ICD-10-CM | POA: Diagnosis not present

## 2023-08-08 DIAGNOSIS — Z1152 Encounter for screening for COVID-19: Secondary | ICD-10-CM | POA: Diagnosis not present

## 2023-08-08 DIAGNOSIS — M1711 Unilateral primary osteoarthritis, right knee: Secondary | ICD-10-CM | POA: Diagnosis not present

## 2023-08-08 DIAGNOSIS — Z823 Family history of stroke: Secondary | ICD-10-CM

## 2023-08-08 DIAGNOSIS — E1142 Type 2 diabetes mellitus with diabetic polyneuropathy: Secondary | ICD-10-CM | POA: Diagnosis present

## 2023-08-08 DIAGNOSIS — M1712 Unilateral primary osteoarthritis, left knee: Secondary | ICD-10-CM | POA: Diagnosis not present

## 2023-08-08 DIAGNOSIS — I129 Hypertensive chronic kidney disease with stage 1 through stage 4 chronic kidney disease, or unspecified chronic kidney disease: Secondary | ICD-10-CM | POA: Diagnosis not present

## 2023-08-08 DIAGNOSIS — L03116 Cellulitis of left lower limb: Secondary | ICD-10-CM | POA: Diagnosis not present

## 2023-08-08 DIAGNOSIS — A419 Sepsis, unspecified organism: Principal | ICD-10-CM | POA: Diagnosis present

## 2023-08-08 DIAGNOSIS — E669 Obesity, unspecified: Secondary | ICD-10-CM | POA: Diagnosis not present

## 2023-08-08 DIAGNOSIS — I2510 Atherosclerotic heart disease of native coronary artery without angina pectoris: Secondary | ICD-10-CM | POA: Diagnosis not present

## 2023-08-08 DIAGNOSIS — Z79899 Other long term (current) drug therapy: Secondary | ICD-10-CM | POA: Diagnosis not present

## 2023-08-08 DIAGNOSIS — G2581 Restless legs syndrome: Secondary | ICD-10-CM | POA: Diagnosis present

## 2023-08-08 DIAGNOSIS — Z888 Allergy status to other drugs, medicaments and biological substances status: Secondary | ICD-10-CM

## 2023-08-08 DIAGNOSIS — Z880 Allergy status to penicillin: Secondary | ICD-10-CM

## 2023-08-08 DIAGNOSIS — R509 Fever, unspecified: Secondary | ICD-10-CM | POA: Diagnosis not present

## 2023-08-08 DIAGNOSIS — E872 Acidosis, unspecified: Secondary | ICD-10-CM | POA: Diagnosis not present

## 2023-08-08 DIAGNOSIS — R0989 Other specified symptoms and signs involving the circulatory and respiratory systems: Secondary | ICD-10-CM | POA: Diagnosis not present

## 2023-08-08 DIAGNOSIS — R161 Splenomegaly, not elsewhere classified: Secondary | ICD-10-CM | POA: Diagnosis not present

## 2023-08-08 DIAGNOSIS — Z7902 Long term (current) use of antithrombotics/antiplatelets: Secondary | ICD-10-CM

## 2023-08-08 DIAGNOSIS — N179 Acute kidney failure, unspecified: Secondary | ICD-10-CM | POA: Diagnosis not present

## 2023-08-08 DIAGNOSIS — R918 Other nonspecific abnormal finding of lung field: Secondary | ICD-10-CM | POA: Diagnosis not present

## 2023-08-08 DIAGNOSIS — R0902 Hypoxemia: Secondary | ICD-10-CM | POA: Diagnosis present

## 2023-08-08 HISTORY — DX: Type 2 diabetes mellitus without complications: E11.9

## 2023-08-08 MED ORDER — HYDROCODONE-ACETAMINOPHEN 5-325 MG PO TABS: 1.0000 | ORAL_TABLET | Freq: Four times a day (QID) | ORAL | 0 refills | Status: DC | PRN

## 2023-08-08 NOTE — Progress Notes (Signed)
Office Visit Note   Patient: Edwin [name]           Date of Birth: 08/18/1956           MRN: [id number] Visit Date: 08/08/2023              Requested by: Deatra James, MD [phone] [place],  [place] [address] PCP: Deatra James, MD  Chief Complaint  Patient presents with   Right Knee - Pain   Left Knee - Pain      HPI: Patient is a pleasant 67 year old gentleman who is a patient of Dr. Roda Shutters.  He has a 3 to 18-month history of increasing bilateral knee pain.  Because of his x-ray findings and the degree of pain he has an MRI was ordered on both knees to further define this.  He does have a history of gout but is been on Uloric for a while and has good control per his report.  He did recently have some swelling in his left leg and is being treated by his primary care provider for cellulitis.  Currently on Bactrim and he says he is getting improved resolution of his symptoms.  Patient does have perceived fever today.  He does feel his cellulitis is improving.  Continues to have pain in his knees and is wondering if he could have medication until his MRI is done.   Assessment & Plan: Visit Diagnoses: Bilateral knee pain.    Plan: He does have some cellulitis in his left lower extremity but does not communicate to the left knee.  He has no effusions no redness no cellulitis in his knees.  He is currently on Bactrim.  We did take his temperature today per his request it was 102.  Have recommended that he either follow-up today with his primary care or go to an urgent care or the ER.  May need IV antibiotics as he has had in the past.  With regards to his knees unsure of etiology.  I will call in some hydrocodone.  He understands not to take these and take Tylenol.  He also understands that he is not to drive while taking this medication he understands that he may require IV antibiotics at this point.  He understands the consequences of not following up at an urgent new ER today.  He  did say that he had lots of things to do.  Emphasized the importance  Follow-Up Instructions: His primary care or urgent care for his ongoing fevers and cellulitis.  Dr. Roda Shutters to review MRIs  Ortho Exam  Patient is alert, oriented, no adenopathy, well-dressed, normal affect, normal respiratory effort. Examination bilateral knees he has mild soft tissue swelling in the left knee but no effusions bilaterally.  Compartments are soft and compressible negative Denna Haggard' sign he does have some increased swelling in his left leg with mild erythema.  Does not ascend to his knee.  Neurovascularly intact  Imaging: No results found. No images are attached to the encounter.  Labs: Lab Results  Component Value Date   HGBA1C 5.3 05/30/2023   HGBA1C 4.9 07/01/2022   HGBA1C 5.6 02/04/2022     Lab Results  Component Value Date   ALBUMIN 4.4 06/08/2023   ALBUMIN 3.7 05/29/2023   ALBUMIN 3.7 07/01/2022    Lab Results  Component Value Date   MG 2.5 (H) 05/30/2023   MG 2.4 05/29/2023   MG 1.7 02/03/2017   No results found for: "VD25OH"  No results found for: "PREALBUMIN"    Latest Ref Rng & Units 06/08/2023   11:33 AM 05/30/2023    4:27 AM 05/29/2023   12:22 PM  CBC EXTENDED  WBC 4.0 - 10.5 K/uL 5.9  4.7  5.3   RBC 4.22 - 5.81 MIL/uL 3.83  3.79  3.66   Hemoglobin 13.0 - 17.0 g/dL 40.9  81.1  91.4   HCT 39.0 - 52.0 % 36.1  37.3  34.8   Platelets 150 - 400 K/uL 216  180  184   NEUT# 1.7 - 7.7 K/uL 3.6   3.6   Lymph# 0.7 - 4.0 K/uL 1.5   0.8      There is no height or weight on file to calculate BMI.  Orders:  No orders of the defined types were placed in this encounter.  Meds ordered this encounter  Medications   DISCONTD: HYDROcodone-acetaminophen (NORCO/VICODIN) 5-325 MG tablet    Sig: Take 1 tablet by mouth every 6 (six) hours as needed for moderate pain.    Dispense:  20 tablet    Refill:  0   HYDROcodone-acetaminophen (NORCO/VICODIN) 5-325 MG tablet    Sig: Take 1 tablet by  mouth every 6 (six) hours as needed for moderate pain.    Dispense:  20 tablet    Refill:  0     Procedures: No procedures performed  Clinical Data: No additional findings.  ROS:  All other systems negative, except as noted in the HPI. Review of Systems  Objective: Vital Signs: There were no vitals taken for this visit.  Specialty Comments:  No specialty comments available.  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   Allergic rhinitis 02/22/2022   Bipolar disorder in remission (HCC) 02/22/2022   Body mass index (BMI) 37.0-37.9, adult 02/22/2022   Bradycardia 02/22/2022   Cat scratch of forearm 02/22/2022   Cerebral infarction (HCC) 02/22/2022   Closed fracture of metatarsal bone 02/22/2022   Constipation 02/22/2022   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   Dyslipidemia 02/22/2022   Edema 02/22/2022   Electrocardiogram abnormal 02/22/2022   Elevated PSA 02/22/2022   Enthesopathy 02/22/2022   Erectile dysfunction 02/22/2022   History of small bowel obstruction 02/22/2022   Hyperglycemia 02/22/2022   Hyperglycemia due to type 2 diabetes mellitus (HCC) 02/22/2022   Insomnia 02/22/2022   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   Impairment of balance 02/22/2022   Major depression, single episode 02/22/2022   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   Melena 02/22/2022   Mixed hyperlipidemia 02/22/2022   Obstructive sleep apnea syndrome 02/22/2022   Other long term (current) drug therapy 02/22/2022   Encounter for immunization 02/22/2022   Right lower quadrant pain 02/22/2022   Severe recurrent major depression without psychotic features (HCC)  02/22/2022   Umbilical hernia 02/22/2022   Vitamin D deficiency 02/22/2022   Left-sided weakness 02/04/2022   Diabetes mellitus type 2 in nonobese (HCC) 02/04/2022   CVA (cerebral vascular accident) (HCC) 02/04/2022   Enteritis of ileum 02/02/2017   Intestinal obstruction (HCC) 02/02/2017   Persistent recurrent vomiting 02/01/2017   Chronic kidney disease, stage 3 unspecified (HCC) 05/19/2016   Abdominal pain 05/19/2016   Hypokalemia 05/19/2016   Primary gout  Bipolar affective disorder, currently depressed, moderate (HCC) 09/20/2014   Hypertension 11/23/2013   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   Past Medical History:  Diagnosis Date   Allergic rhinitis    Bipolar disorder (HCC)    Bradycardia 2012    due to lithium   CKD (chronic kidney disease) stage 2, GFR 60-89 ml/min    Gout    Hypertension    Mild renal insufficiency    , with creatinine of 1.3 in 2010, was side effect of med   Obesity    ,moderate   Prostatitis    , Episodic   Stroke (HCC)    Suicide attempt (HCC) 09/20/2014    Family History  Problem Relation Age of Onset   Other Mother        Viral Meningitis   Mitral valve prolapse Mother    Arrhythmia Mother    Hypothyroidism Mother    Stroke Mother    Hypertension Father    Gout Father    Alzheimer's disease Father     Past Surgical History:  Procedure Laterality Date   APPENDECTOMY     CATARACT EXTRACTION Right    COLONOSCOPY WITH PROPOFOL N/A 06/14/2014   Procedure: COLONOSCOPY WITH PROPOFOL;  Surgeon: Charolett Bumpers, MD;  Location: [facility];  Service: Endoscopy;  Laterality: N/A;   TONSILLECTOMY     UMBILICAL HERNIA REPAIR     Social History   Occupational History   Not on file  Tobacco Use   Smoking status: Never    Passive exposure: Never   Smokeless tobacco: Never  Substance and Sexual Activity   Alcohol use: No   Drug use: No   Sexual activity: Not on file

## 2023-08-09 ENCOUNTER — Encounter (HOSPITAL_COMMUNITY): Payer: Self-pay

## 2023-08-09 ENCOUNTER — Emergency Department (HOSPITAL_COMMUNITY): Payer: Medicare Other

## 2023-08-09 ENCOUNTER — Other Ambulatory Visit: Payer: Self-pay

## 2023-08-09 DIAGNOSIS — Z8673 Personal history of transient ischemic attack (TIA), and cerebral infarction without residual deficits: Secondary | ICD-10-CM | POA: Diagnosis not present

## 2023-08-09 DIAGNOSIS — E785 Hyperlipidemia, unspecified: Secondary | ICD-10-CM | POA: Diagnosis present

## 2023-08-09 DIAGNOSIS — E1122 Type 2 diabetes mellitus with diabetic chronic kidney disease: Secondary | ICD-10-CM | POA: Diagnosis present

## 2023-08-09 DIAGNOSIS — Z7984 Long term (current) use of oral hypoglycemic drugs: Secondary | ICD-10-CM | POA: Diagnosis not present

## 2023-08-09 DIAGNOSIS — M109 Gout, unspecified: Secondary | ICD-10-CM | POA: Diagnosis present

## 2023-08-09 DIAGNOSIS — N179 Acute kidney failure, unspecified: Secondary | ICD-10-CM

## 2023-08-09 DIAGNOSIS — L539 Erythematous condition, unspecified: Secondary | ICD-10-CM | POA: Diagnosis present

## 2023-08-09 DIAGNOSIS — Z79899 Other long term (current) drug therapy: Secondary | ICD-10-CM | POA: Diagnosis not present

## 2023-08-09 DIAGNOSIS — I10 Essential (primary) hypertension: Secondary | ICD-10-CM

## 2023-08-09 DIAGNOSIS — E1142 Type 2 diabetes mellitus with diabetic polyneuropathy: Secondary | ICD-10-CM

## 2023-08-09 DIAGNOSIS — Z8249 Family history of ischemic heart disease and other diseases of the circulatory system: Secondary | ICD-10-CM | POA: Diagnosis not present

## 2023-08-09 DIAGNOSIS — L03116 Cellulitis of left lower limb: Secondary | ICD-10-CM | POA: Diagnosis present

## 2023-08-09 DIAGNOSIS — F319 Bipolar disorder, unspecified: Secondary | ICD-10-CM

## 2023-08-09 DIAGNOSIS — N1832 Chronic kidney disease, stage 3b: Secondary | ICD-10-CM

## 2023-08-09 DIAGNOSIS — E875 Hyperkalemia: Secondary | ICD-10-CM | POA: Diagnosis present

## 2023-08-09 DIAGNOSIS — I2510 Atherosclerotic heart disease of native coronary artery without angina pectoris: Secondary | ICD-10-CM | POA: Diagnosis present

## 2023-08-09 DIAGNOSIS — A419 Sepsis, unspecified organism: Principal | ICD-10-CM

## 2023-08-09 DIAGNOSIS — E669 Obesity, unspecified: Secondary | ICD-10-CM | POA: Diagnosis present

## 2023-08-09 DIAGNOSIS — I129 Hypertensive chronic kidney disease with stage 1 through stage 4 chronic kidney disease, or unspecified chronic kidney disease: Secondary | ICD-10-CM | POA: Diagnosis present

## 2023-08-09 DIAGNOSIS — R6520 Severe sepsis without septic shock: Secondary | ICD-10-CM | POA: Diagnosis present

## 2023-08-09 DIAGNOSIS — N400 Enlarged prostate without lower urinary tract symptoms: Secondary | ICD-10-CM | POA: Diagnosis present

## 2023-08-09 DIAGNOSIS — Z1152 Encounter for screening for COVID-19: Secondary | ICD-10-CM | POA: Diagnosis not present

## 2023-08-09 DIAGNOSIS — M170 Bilateral primary osteoarthritis of knee: Secondary | ICD-10-CM | POA: Diagnosis present

## 2023-08-09 DIAGNOSIS — Z6835 Body mass index (BMI) 35.0-35.9, adult: Secondary | ICD-10-CM | POA: Diagnosis not present

## 2023-08-09 DIAGNOSIS — E872 Acidosis, unspecified: Secondary | ICD-10-CM | POA: Diagnosis present

## 2023-08-09 DIAGNOSIS — G2581 Restless legs syndrome: Secondary | ICD-10-CM | POA: Diagnosis present

## 2023-08-09 LAB — URINALYSIS, W/ REFLEX TO CULTURE (INFECTION SUSPECTED)
Bacteria, UA: NONE SEEN
Bilirubin Urine: NEGATIVE
Glucose, UA: >=500 mg/dL — AB
Ketones, ur: NEGATIVE mg/dL
Leukocytes,Ua: NEGATIVE
Nitrite: NEGATIVE
Protein, ur: NEGATIVE mg/dL
Specific Gravity, Urine: 1.007 (ref 1.005–1.030)
pH: 6 (ref 5.0–8.0)

## 2023-08-09 LAB — CBC WITH DIFFERENTIAL/PLATELET
Abs Immature Granulocytes: 0.06 10*3/uL (ref 0.00–0.07)
Abs Immature Granulocytes: 0.08 10*3/uL — ABNORMAL HIGH (ref 0.00–0.07)
Basophils Absolute: 0 10*3/uL (ref 0.0–0.1)
Basophils Absolute: 0.1 10*3/uL (ref 0.0–0.1)
Basophils Relative: 0 %
Basophils Relative: 0 %
Eosinophils Absolute: 0 10*3/uL (ref 0.0–0.5)
Eosinophils Absolute: 0 10*3/uL (ref 0.0–0.5)
Eosinophils Relative: 0 %
Eosinophils Relative: 0 %
HCT: 37.3 % — ABNORMAL LOW (ref 39.0–52.0)
HCT: 43.1 % (ref 39.0–52.0)
Hemoglobin: 12.1 g/dL — ABNORMAL LOW (ref 13.0–17.0)
Hemoglobin: 13.8 g/dL (ref 13.0–17.0)
Immature Granulocytes: 1 %
Immature Granulocytes: 1 %
Lymphocytes Relative: 5 %
Lymphocytes Relative: 6 %
Lymphs Abs: 0.6 10*3/uL — ABNORMAL LOW (ref 0.7–4.0)
Lymphs Abs: 0.7 10*3/uL (ref 0.7–4.0)
MCH: 30.4 pg (ref 26.0–34.0)
MCH: 31.1 pg (ref 26.0–34.0)
MCHC: 32 g/dL (ref 30.0–36.0)
MCHC: 32.4 g/dL (ref 30.0–36.0)
MCV: 94.9 fL (ref 80.0–100.0)
MCV: 95.9 fL (ref 80.0–100.0)
Monocytes Absolute: 0.8 10*3/uL (ref 0.1–1.0)
Monocytes Absolute: 1 10*3/uL (ref 0.1–1.0)
Monocytes Relative: 7 %
Monocytes Relative: 8 %
Neutro Abs: 11.1 10*3/uL — ABNORMAL HIGH (ref 1.7–7.7)
Neutro Abs: 9.8 10*3/uL — ABNORMAL HIGH (ref 1.7–7.7)
Neutrophils Relative %: 85 %
Neutrophils Relative %: 87 %
Platelets: 158 10*3/uL (ref 150–400)
Platelets: 158 10*3/uL (ref 150–400)
RBC: 3.89 MIL/uL — ABNORMAL LOW (ref 4.22–5.81)
RBC: 4.54 MIL/uL (ref 4.22–5.81)
RDW: 13.3 % (ref 11.5–15.5)
RDW: 13.4 % (ref 11.5–15.5)
WBC: 11.4 10*3/uL — ABNORMAL HIGH (ref 4.0–10.5)
WBC: 13 10*3/uL — ABNORMAL HIGH (ref 4.0–10.5)
nRBC: 0 % (ref 0.0–0.2)
nRBC: 0 % (ref 0.0–0.2)

## 2023-08-09 LAB — COMPREHENSIVE METABOLIC PANEL
ALT: 19 U/L (ref 0–44)
ALT: 23 U/L (ref 0–44)
AST: 29 U/L (ref 15–41)
AST: 32 U/L (ref 15–41)
Albumin: 3.5 g/dL (ref 3.5–5.0)
Albumin: 4.5 g/dL (ref 3.5–5.0)
Alkaline Phosphatase: 61 U/L (ref 38–126)
Alkaline Phosphatase: 75 U/L (ref 38–126)
Anion gap: 10 (ref 5–15)
Anion gap: 12 (ref 5–15)
BUN: 28 mg/dL — ABNORMAL HIGH (ref 8–23)
BUN: 34 mg/dL — ABNORMAL HIGH (ref 8–23)
CO2: 22 mmol/L (ref 22–32)
CO2: 23 mmol/L (ref 22–32)
Calcium: 8.7 mg/dL — ABNORMAL LOW (ref 8.9–10.3)
Calcium: 9.3 mg/dL (ref 8.9–10.3)
Chloride: 101 mmol/L (ref 98–111)
Chloride: 98 mmol/L (ref 98–111)
Creatinine, Ser: 2.28 mg/dL — ABNORMAL HIGH (ref 0.61–1.24)
Creatinine, Ser: 2.53 mg/dL — ABNORMAL HIGH (ref 0.61–1.24)
GFR, Estimated: 27 mL/min — ABNORMAL LOW (ref >60)
GFR, Estimated: 31 mL/min — ABNORMAL LOW (ref >60)
Glucose, Bld: 124 mg/dL — ABNORMAL HIGH (ref 70–99)
Glucose, Bld: 142 mg/dL — ABNORMAL HIGH (ref 70–99)
Potassium: 4.7 mmol/L (ref 3.5–5.1)
Potassium: 4.9 mmol/L (ref 3.5–5.1)
Sodium: 133 mmol/L — ABNORMAL LOW (ref 135–145)
Sodium: 133 mmol/L — ABNORMAL LOW (ref 135–145)
Total Bilirubin: 0.8 mg/dL (ref 0.3–1.2)
Total Bilirubin: 1.1 mg/dL (ref 0.3–1.2)
Total Protein: 6.8 g/dL (ref 6.5–8.1)
Total Protein: 7.8 g/dL (ref 6.5–8.1)

## 2023-08-09 LAB — PROTIME-INR
INR: 1 (ref 0.8–1.2)
Prothrombin Time: 13.7 seconds (ref 11.4–15.2)

## 2023-08-09 LAB — RESP PANEL BY RT-PCR (RSV, FLU A&B, COVID)  RVPGX2
Influenza A by PCR: NEGATIVE
Influenza B by PCR: NEGATIVE
Resp Syncytial Virus by PCR: NEGATIVE
SARS Coronavirus 2 by RT PCR: NEGATIVE

## 2023-08-09 LAB — SODIUM, URINE, RANDOM: Sodium, Ur: 64 mmol/L

## 2023-08-09 LAB — I-STAT CG4 LACTIC ACID, ED
Lactic Acid, Venous: 0.8 mmol/L (ref 0.5–1.9)
Lactic Acid, Venous: 2 mmol/L (ref 0.5–1.9)

## 2023-08-09 LAB — CK: Total CK: 257 U/L (ref 49–397)

## 2023-08-09 LAB — GLUCOSE, CAPILLARY
Glucose-Capillary: 88 mg/dL (ref 70–99)
Glucose-Capillary: 102 mg/dL — ABNORMAL HIGH (ref 70–99)
Glucose-Capillary: 143 mg/dL — ABNORMAL HIGH (ref 70–99)
Glucose-Capillary: 138 mg/dL — ABNORMAL HIGH (ref 70–99)

## 2023-08-09 LAB — CREATININE, URINE, RANDOM: Creatinine, Urine: 28 mg/dL

## 2023-08-09 MED ORDER — FEBUXOSTAT 40 MG PO TABS
20.0000 mg | ORAL_TABLET | Freq: Every day | ORAL | Status: DC
  Administered 2023-08-09 – 2023-08-11 (×3): 20 mg via ORAL
  Filled 2023-08-09 (×3): qty 1

## 2023-08-09 MED ORDER — SODIUM CHLORIDE 0.9 % IV SOLN: INTRAVENOUS | Status: DC

## 2023-08-09 MED ORDER — ACETAMINOPHEN 500 MG PO TABS
1000.0000 mg | ORAL_TABLET | Freq: Once | ORAL | Status: AC
  Administered 2023-08-09: 1000 mg via ORAL
  Filled 2023-08-09: qty 2

## 2023-08-09 MED ORDER — DIVALPROEX SODIUM ER 500 MG PO TB24
1000.0000 mg | ORAL_TABLET | Freq: Every day | ORAL | Status: DC
  Administered 2023-08-09 – 2023-08-10 (×2): 1000 mg via ORAL
  Filled 2023-08-09 (×2): qty 2

## 2023-08-09 MED ORDER — LACTATED RINGERS IV BOLUS (SEPSIS)
1000.0000 mL | Freq: Once | INTRAVENOUS | Status: AC
  Administered 2023-08-09: 1000 mL via INTRAVENOUS

## 2023-08-09 MED ORDER — NALOXONE HCL 0.4 MG/ML IJ SOLN: 0.4000 mg | INTRAMUSCULAR | Status: DC | PRN

## 2023-08-09 MED ORDER — OXYCODONE HCL 5 MG PO TABS: 5.0000 mg | ORAL_TABLET | Freq: Four times a day (QID) | ORAL | Status: DC | PRN

## 2023-08-09 MED ORDER — LACTATED RINGERS IV BOLUS
1000.0000 mL | Freq: Once | INTRAVENOUS | Status: AC
  Administered 2023-08-09: 1000 mL via INTRAVENOUS

## 2023-08-09 MED ORDER — INSULIN ASPART 100 UNIT/ML IJ SOLN: 0.0000 [IU] | Freq: Three times a day (TID) | INTRAMUSCULAR | Status: DC

## 2023-08-09 MED ORDER — LAMOTRIGINE 100 MG PO TABS
200.0000 mg | ORAL_TABLET | Freq: Every morning | ORAL | Status: DC
  Administered 2023-08-09 – 2023-08-11 (×3): 200 mg via ORAL
  Filled 2023-08-09 (×3): qty 2

## 2023-08-09 MED ORDER — VANCOMYCIN HCL 2000 MG/400ML IV SOLN
2000.0000 mg | Freq: Once | INTRAVENOUS | Status: AC
  Administered 2023-08-09: 2000 mg via INTRAVENOUS
  Filled 2023-08-09: qty 400

## 2023-08-09 MED ORDER — ACETAMINOPHEN 500 MG PO TABS
1000.0000 mg | ORAL_TABLET | Freq: Three times a day (TID) | ORAL | Status: DC
  Administered 2023-08-09 – 2023-08-11 (×5): 1000 mg via ORAL
  Filled 2023-08-09 (×5): qty 2

## 2023-08-09 MED ORDER — VANCOMYCIN HCL 1250 MG/250ML IV SOLN
1250.0000 mg | INTRAVENOUS | Status: DC
  Administered 2023-08-10: 1250 mg via INTRAVENOUS
  Filled 2023-08-09: qty 250

## 2023-08-09 MED ORDER — SODIUM CHLORIDE 0.9 % IV SOLN
2.0000 g | Freq: Two times a day (BID) | INTRAVENOUS | Status: DC
  Administered 2023-08-09 – 2023-08-11 (×5): 2 g via INTRAVENOUS
  Filled 2023-08-09 (×5): qty 12.5

## 2023-08-09 MED ORDER — DULOXETINE HCL 60 MG PO CPEP
120.0000 mg | ORAL_CAPSULE | Freq: Every day | ORAL | Status: DC
  Administered 2023-08-09 – 2023-08-11 (×3): 120 mg via ORAL
  Filled 2023-08-09 (×3): qty 2

## 2023-08-09 MED ORDER — MELATONIN 3 MG PO TABS: 3.0000 mg | ORAL_TABLET | Freq: Every evening | ORAL | Status: DC | PRN

## 2023-08-09 MED ORDER — ONDANSETRON HCL 4 MG/2ML IJ SOLN: 4.0000 mg | Freq: Four times a day (QID) | INTRAMUSCULAR | Status: DC | PRN

## 2023-08-09 MED ORDER — ACETAMINOPHEN 650 MG RE SUPP: 650.0000 mg | Freq: Four times a day (QID) | RECTAL | Status: DC | PRN

## 2023-08-09 MED ORDER — ROSUVASTATIN CALCIUM 20 MG PO TABS: 40.0000 mg | ORAL_TABLET | Freq: Every day | ORAL | Status: DC

## 2023-08-09 MED ORDER — HEPARIN SODIUM (PORCINE) 5000 UNIT/ML IJ SOLN
5000.0000 [IU] | Freq: Three times a day (TID) | INTRAMUSCULAR | Status: DC
  Administered 2023-08-09 – 2023-08-11 (×7): 5000 [IU] via SUBCUTANEOUS
  Filled 2023-08-09 (×7): qty 1

## 2023-08-09 MED ORDER — VANCOMYCIN HCL IN DEXTROSE 1-5 GM/200ML-% IV SOLN: 1000.0000 mg | Freq: Once | INTRAVENOUS | Status: DC

## 2023-08-09 MED ORDER — PRAMIPEXOLE DIHYDROCHLORIDE 0.25 MG PO TABS
0.5000 mg | ORAL_TABLET | Freq: Two times a day (BID) | ORAL | Status: DC
  Administered 2023-08-09 – 2023-08-11 (×5): 0.5 mg via ORAL
  Filled 2023-08-09 (×5): qty 2

## 2023-08-09 MED ORDER — LACTATED RINGERS IV BOLUS (SEPSIS)
500.0000 mL | Freq: Once | INTRAVENOUS | Status: AC
  Administered 2023-08-09: 500 mL via INTRAVENOUS

## 2023-08-09 MED ORDER — TAMSULOSIN HCL 0.4 MG PO CAPS
0.4000 mg | ORAL_CAPSULE | Freq: Every day | ORAL | Status: DC
  Administered 2023-08-09 – 2023-08-11 (×3): 0.4 mg via ORAL
  Filled 2023-08-09 (×3): qty 1

## 2023-08-09 MED ORDER — ROSUVASTATIN CALCIUM 20 MG PO TABS
20.0000 mg | ORAL_TABLET | Freq: Every day | ORAL | Status: DC
  Administered 2023-08-09 – 2023-08-11 (×3): 20 mg via ORAL
  Filled 2023-08-09 (×3): qty 1

## 2023-08-09 MED ORDER — INSULIN ASPART 100 UNIT/ML IJ SOLN: 0.0000 [IU] | Freq: Every day | INTRAMUSCULAR | Status: DC

## 2023-08-09 MED ORDER — FENTANYL CITRATE PF 50 MCG/ML IJ SOSY: 50.0000 ug | PREFILLED_SYRINGE | INTRAMUSCULAR | Status: DC | PRN

## 2023-08-09 MED ORDER — INSULIN ASPART 100 UNIT/ML IJ SOLN
0.0000 [IU] | Freq: Three times a day (TID) | INTRAMUSCULAR | Status: DC
  Administered 2023-08-09: 2 [IU] via SUBCUTANEOUS
  Administered 2023-08-10: 1 [IU] via SUBCUTANEOUS
  Administered 2023-08-10: 2 [IU] via SUBCUTANEOUS
  Filled 2023-08-09: qty 0.09

## 2023-08-09 MED ORDER — ACETAMINOPHEN 325 MG PO TABS
650.0000 mg | ORAL_TABLET | Freq: Four times a day (QID) | ORAL | Status: DC | PRN
  Administered 2023-08-09: 650 mg via ORAL
  Filled 2023-08-09: qty 2

## 2023-08-09 MED ORDER — LACTATED RINGERS IV SOLN: INTRAVENOUS | Status: DC

## 2023-08-09 MED ORDER — CLOPIDOGREL BISULFATE 75 MG PO TABS
75.0000 mg | ORAL_TABLET | Freq: Every day | ORAL | Status: DC
  Administered 2023-08-09 – 2023-08-11 (×3): 75 mg via ORAL
  Filled 2023-08-09 (×4): qty 1

## 2023-08-09 NOTE — Progress Notes (Signed)
   08/09/23 1037  TOC Brief Assessment  Insurance and Status Reviewed  Patient has primary care physician Yes  Home environment has been reviewed Lives in single family home  Prior level of function: Independent at baseline  Prior/Current Home Services No current home services  Social Determinants of Health Reivew SDOH reviewed no interventions necessary  Readmission risk has been reviewed Yes  Transition of care needs no transition of care needs at this time

## 2023-08-09 NOTE — Progress Notes (Addendum)
Pharmacy Antibiotic Note  Edwin [name] is a 67 y.o. male admitted on 08/08/2023 with w/history of bipolar, CKD, hypertension presents with fever and generalized weakness.  Patient reports he was recently treated for cellulitis of his left lower extremity .  Pharmacy has been consulted for vancomcyin and cefepime dosing.  Plan: Vancomycin 2gm x 1 then 1250mg  IV q36h (AUC 521, Scr 2.53) Cefepime 2gm IV q12h Follow renal function, cultures and clinical course  Height: 5\' 10"  (177.8 cm) Weight: 109 kg (240 lb 4.8 oz) IBW/kg (Calculated) : 73  Temp (24hrs), Avg:101.6 F (38.7 C), Min:100.1 F (37.8 C), Max:102.6 F (39.2 C)  Recent Labs  Lab 08/09/23 0003 08/09/23 0021 08/09/23 0216  WBC 11.4*  --   --   CREATININE 2.53*  --   --   LATICACIDVEN  --  2.0* 0.8    Estimated Creatinine Clearance: 36 mL/min (A) (by C-G formula based on SCr of 2.53 mg/dL (H)).    Allergies  Allergen Reactions   Allopurinol Other (See Comments)    Made pt emotionally unstable  Other reaction(s): emotionally labile   Penicillins Hives and Other (See Comments)   Finerenone Other (See Comments)    hyperkalemia Other reaction(s): Increases  potassium levels   Penicillin G     Other reaction(s): hives   Aspirin Hives   Ciprofloxacin Rash    Other reaction(s): rash    Antimicrobials this admission: 9/20 vanc >> 9/20 cefepime >>  Dose adjustments this admission:   Microbiology results: 9/20 BCx:   Thank you for allowing pharmacy to be a part of this patient's care.  Arley Phenix RPh 08/09/2023, 3:01 AM

## 2023-08-09 NOTE — ED Notes (Signed)
ED TO INPATIENT HANDOFF REPORT  ED Nurse Name and Phone #: Jacqulyn Liner EMTP [phone]  S Name/Age/Gender Edwin [name] 67 y.o. male Room/Bed: WA21/WA21  Code Status   Code Status: Full Code  Home/SNF/Other Home Patient oriented to: self, place, time, and situation Is this baseline? Yes   Triage Complete: Triage complete  Chief Complaint Cellulitis of left lower extremity [L03.116]  Triage Note Pt reports with bilateral knee pain (chronic) pt is due to have a mri next Friday. Pt states that his provider is concerned for cellulitis to his left lower leg. Pt has redness and swelling. Pt also has a fever and does not feel well since today.    Allergies Allergies  Allergen Reactions   Allopurinol Other (See Comments)    Made pt emotionally unstable  Other reaction(s): emotionally labile   Penicillins Hives and Other (See Comments)   Finerenone Other (See Comments)    hyperkalemia Other reaction(s): Increases  potassium levels   Penicillin G     Other reaction(s): hives   Aspirin Hives   Ciprofloxacin Rash    Other reaction(s): rash    Level of Care/Admitting Diagnosis ED Disposition     ED Disposition  Admit   Condition  --   Comment  Hospital Area: [facility] [[id number]]  Level of Care: Telemetry [5]  Admit to tele based on following criteria: Monitor for Ischemic changes  May admit patient to [facility] or [facility] if equivalent level of care is available:: No  Covid Evaluation: Asymptomatic - no recent exposure (last 10 days) testing not required  Diagnosis: Cellulitis of left lower extremity [454098]  Admitting Physician: Angie Fava [[id number]]  Attending Physician: Angie Fava [[id number]]  Certification:: I certify this patient will need inpatient services for at least 2 midnights  Expected Medical Readiness: 08/11/2023          B Medical/Surgery History Past Medical History:  Diagnosis Date   Allergic rhinitis     Bipolar disorder (HCC)    Bradycardia 2012    due to lithium   CKD (chronic kidney disease) stage 2, GFR 60-89 ml/min    DM2 (diabetes mellitus, type 2) (HCC)    Gout    Hypertension    Mild renal insufficiency    , with creatinine of 1.3 in 2010, was side effect of med   Obesity    ,moderate   Prostatitis    , Episodic   Stroke (HCC)    Suicide attempt (HCC) 09/20/2014   Past Surgical History:  Procedure Laterality Date   APPENDECTOMY     CATARACT EXTRACTION Right    COLONOSCOPY WITH PROPOFOL N/A 06/14/2014   Procedure: COLONOSCOPY WITH PROPOFOL;  Surgeon: Charolett Bumpers, MD;  Location: [facility];  Service: Endoscopy;  Laterality: N/A;   TONSILLECTOMY     UMBILICAL HERNIA REPAIR       A IV Location/Drains/Wounds Patient Lines/Drains/Airways Status     Active Line/Drains/Airways     Name Placement date Placement time Site Days   Peripheral IV 08/09/23 20 G Right Antecubital 08/09/23  0039  Antecubital  less than 1            Intake/Output Last 24 hours  Intake/Output Summary (Last 24 hours) at 08/09/2023 0429 Last data filed at 08/09/2023 0141 Gross per 24 hour  Intake 1000 ml  Output 2000 ml  Net -1000 ml    Labs/Imaging Results for orders placed or performed during the hospital encounter of 08/08/23 (from  the past 48 hour(s))  Comprehensive metabolic panel     Status: Abnormal   Collection Time: 08/09/23 12:03 AM  Result Value Ref Range   Sodium 133 (L) 135 - 145 mmol/L   Potassium 4.7 3.5 - 5.1 mmol/L   Chloride 98 98 - 111 mmol/L   CO2 23 22 - 32 mmol/L   Glucose, Bld 142 (H) 70 - 99 mg/dL    Comment: Glucose reference range applies only to samples taken after fasting for at least 8 hours.   BUN 34 (H) 8 - 23 mg/dL   Creatinine, Ser 1.61 (H) 0.61 - 1.24 mg/dL   Calcium 9.3 8.9 - 09.6 mg/dL   Total Protein 7.8 6.5 - 8.1 g/dL   Albumin 4.5 3.5 - 5.0 g/dL   AST 29 15 - 41 U/L   ALT 23 0 - 44 U/L   Alkaline Phosphatase 75 38 - 126 U/L   Total  Bilirubin 0.8 0.3 - 1.2 mg/dL   GFR, Estimated 27 (L) >60 mL/min    Comment: (NOTE) Calculated using the CKD-EPI Creatinine Equation (2021)    Anion gap 12 5 - 15    Comment: Performed at [facility], [address] [place], [place] [address]  CBC with Differential     Status: Abnormal   Collection Time: 08/09/23 12:03 AM  Result Value Ref Range   WBC 11.4 (H) 4.0 - 10.5 K/uL   RBC 4.54 4.22 - 5.81 MIL/uL   Hemoglobin 13.8 13.0 - 17.0 g/dL   HCT 98.1 19.1 - 47.8 %   MCV 94.9 80.0 - 100.0 fL   MCH 30.4 26.0 - 34.0 pg   MCHC 32.0 30.0 - 36.0 g/dL   RDW 29.5 62.1 - 30.8 %   Platelets 158 150 - 400 K/uL   nRBC 0.0 0.0 - 0.2 %   Neutrophils Relative % 87 %   Neutro Abs 9.8 (H) 1.7 - 7.7 K/uL   Lymphocytes Relative 5 %   Lymphs Abs 0.6 (L) 0.7 - 4.0 K/uL   Monocytes Relative 7 %   Monocytes Absolute 0.8 0.1 - 1.0 K/uL   Eosinophils Relative 0 %   Eosinophils Absolute 0.0 0.0 - 0.5 K/uL   Basophils Relative 0 %   Basophils Absolute 0.1 0.0 - 0.1 K/uL   Immature Granulocytes 1 %   Abs Immature Granulocytes 0.06 0.00 - 0.07 K/uL    Comment: Performed at [facility], [address], [place], [place] [address]  Protime-INR     Status: None   Collection Time: 08/09/23 12:03 AM  Result Value Ref Range   Prothrombin Time 13.7 11.4 - 15.2 seconds   INR 1.0 0.8 - 1.2    Comment: (NOTE) INR goal varies based on device and disease states. Performed at [facility], [address], [place], [place] [address]   Resp panel by RT-PCR (RSV, Flu A&B, Covid) Anterior Nasal Swab     Status: None   Collection Time: 08/09/23 12:04 AM   Specimen: Anterior Nasal Swab  Result Value Ref Range   SARS Coronavirus 2 by RT PCR NEGATIVE NEGATIVE    Comment: (NOTE) SARS-CoV-2 target nucleic acids are NOT DETECTED.  The SARS-CoV-2 RNA is generally detectable in upper respiratory specimens during the acute phase of infection. The  lowest concentration of SARS-CoV-2 viral copies this assay can detect is 138 copies/mL. A negative result does not preclude SARS-Cov-2 infection and should not be used as the sole basis for treatment  or other patient management decisions. A negative result may occur with  improper specimen collection/handling, submission of specimen other than nasopharyngeal swab, presence of viral mutation(s) within the areas targeted by this assay, and inadequate number of viral copies(<138 copies/mL). A negative result must be combined with clinical observations, patient history, and epidemiological information. The expected result is Negative.  Fact Sheet for Patients:  BloggerCourse.com  Fact Sheet for Healthcare Providers:  SeriousBroker.it  This test is no t yet approved or cleared by the [place] FDA and  has been authorized for detection and/or diagnosis of SARS-CoV-2 by FDA under an Emergency Use Authorization (EUA). This EUA will remain  in effect (meaning this test can be used) for the duration of the COVID-19 declaration under Section 564(b)(1) of the Act, 21 U.S.C.section 360bbb-3(b)(1), unless the authorization is terminated  or revoked sooner.       Influenza A by PCR NEGATIVE NEGATIVE   Influenza B by PCR NEGATIVE NEGATIVE    Comment: (NOTE) The Xpert Xpress SARS-CoV-2/FLU/RSV plus assay is intended as an aid in the diagnosis of influenza from Nasopharyngeal swab specimens and should not be used as a sole basis for treatment. Nasal washings and aspirates are unacceptable for Xpert Xpress SARS-CoV-2/FLU/RSV testing.  Fact Sheet for Patients: BloggerCourse.com  Fact Sheet for Healthcare Providers: SeriousBroker.it  This test is not yet approved or cleared by the [place] FDA and has been authorized for detection and/or diagnosis of SARS-CoV-2 by FDA under an Emergency  Use Authorization (EUA). This EUA will remain in effect (meaning this test can be used) for the duration of the COVID-19 declaration under Section 564(b)(1) of the Act, 21 U.S.C. section 360bbb-3(b)(1), unless the authorization is terminated or revoked.     Resp Syncytial Virus by PCR NEGATIVE NEGATIVE    Comment: (NOTE) Fact Sheet for Patients: BloggerCourse.com  Fact Sheet for Healthcare Providers: SeriousBroker.it  This test is not yet approved or cleared by the [place] FDA and has been authorized for detection and/or diagnosis of SARS-CoV-2 by FDA under an Emergency Use Authorization (EUA). This EUA will remain in effect (meaning this test can be used) for the duration of the COVID-19 declaration under Section 564(b)(1) of the Act, 21 U.S.C. section 360bbb-3(b)(1), unless the authorization is terminated or revoked.  Performed at [facility], [address], [place], [place] [address]   I-Stat Lactic Acid, ED     Status: Abnormal   Collection Time: 08/09/23 12:21 AM  Result Value Ref Range   Lactic Acid, Venous 2.0 (HH) 0.5 - 1.9 mmol/L   Comment NOTIFIED PHYSICIAN   Urinalysis, w/ Reflex to Culture (Infection Suspected) -Urine, Clean Catch     Status: Abnormal   Collection Time: 08/09/23  1:30 AM  Result Value Ref Range   Specimen Source URINE, CATHETERIZED    Color, Urine STRAW (A) YELLOW   APPearance CLEAR CLEAR   Specific Gravity, Urine 1.007 1.005 - 1.030   pH 6.0 5.0 - 8.0   Glucose, UA >=500 (A) NEGATIVE mg/dL   Hgb urine dipstick SMALL (A) NEGATIVE   Bilirubin Urine NEGATIVE NEGATIVE   Ketones, ur NEGATIVE NEGATIVE mg/dL   Protein, ur NEGATIVE NEGATIVE mg/dL   Nitrite NEGATIVE NEGATIVE   Leukocytes,Ua NEGATIVE NEGATIVE   RBC / HPF 0-5 0 - 5 RBC/hpf   WBC, UA 0-5 0 - 5 WBC/hpf    Comment:        Reflex urine culture not performed if WBC <=10, OR if Squamous epithelial cells >  5. If  Squamous epithelial cells >5 suggest recollection.    Bacteria, UA NONE SEEN NONE SEEN   Squamous Epithelial / HPF 0-5 0 - 5 /HPF    Comment: Performed at [facility], [address], [place], [place] [address]  I-Stat Lactic Acid, ED     Status: None   Collection Time: 08/09/23  2:16 AM  Result Value Ref Range   Lactic Acid, Venous 0.8 0.5 - 1.9 mmol/L   CT Chest Wo Contrast  Result Date: 08/09/2023 CLINICAL DATA:  Fever and cough, pneumonia suspected. Equivocal x-ray due to low lung volumes. EXAM: CT CHEST WITHOUT CONTRAST TECHNIQUE: Multidetector CT imaging of the chest was performed following the standard protocol without IV contrast. RADIATION DOSE REDUCTION: This exam was performed according to the departmental dose-optimization program which includes automated exposure control, adjustment of the mA and/or kV according to patient size and/or use of iterative reconstruction technique. COMPARISON:  AP Lat chest today, portable chest 07/01/2022, abdomen and pelvis CT no contrast 05/18/2016. No prior chest CT. FINDINGS: Cardiovascular: The cardiac size is normal. There are patchy three-vessel coronary calcifications. There is no pericardial effusion. There is scattered aortic calcific plaque with mild tortuosity, without aortic aneurysm. The great vessels branch normally without visible significant plaques. The pulmonary veins are nondistended. There is a prominent pulmonary trunk measuring 3.3 cm indicating arterial hypertension with slightly prominent main pulmonary arteries. Mediastinum/Nodes: No enlarged mediastinal or axillary lymph nodes. Thyroid gland, trachea, and esophagus demonstrate no significant findings. There is a small hiatal hernia. The main bronchi are clear. Lungs/Pleura: Lung bases again demonstrate scattered linear scarring or atelectasis. There is no consolidation, effusion or pneumothorax. No pulmonary nodules are seen. Upper Abdomen: Mildly prominent spleen  measuring 14 cm coronal. The liver is slightly steatotic. There is abdominal aortic atherosclerosis. No acute upper abdominal findings. Musculoskeletal: There is extensive thoracic spine bridging enthesopathy. Mild thoracic kyphosis. No acute or other significant osseous findings or suspicious regional bone lesion. Unremarkable chest wall. IMPRESSION: 1. No acute noncontrast chest CT findings. 2. Aortic and coronary artery atherosclerosis. 3. Small hiatal hernia. 4. Prominent pulmonary trunk and main pulmonary arteries. 5. Mild hepatic steatosis and mild splenomegaly. Aortic Atherosclerosis (ICD10-I70.0). Electronically Signed   By: Almira Bar M.D.   On: 08/09/2023 03:35   DG Chest 2 View  Result Date: 08/09/2023 CLINICAL DATA:  Lower leg cellulitis, fever EXAM: CHEST - 2 VIEW COMPARISON:  07/01/2022 FINDINGS: Low lung volumes. Suspected mild bibasilar atelectasis. No frank interstitial edema. No pleural effusion or pneumothorax. Heart is top-normal in size. Degenerative changes of the visualized thoracolumbar spine. IMPRESSION: Low lung volumes. Suspected mild bibasilar atelectasis. Electronically Signed   By: Charline Bills M.D.   On: 08/09/2023 01:04    Pending Labs Unresulted Labs (From admission, onward)     Start     Ordered   08/09/23 0500  CBC with Differential/Platelet  Tomorrow morning,   R        08/09/23 0406   08/09/23 0500  Comprehensive metabolic panel  Tomorrow morning,   R        08/09/23 0406   08/09/23 0427  Sodium, urine, random  Add-on,   AD        08/09/23 0426   08/09/23 0427  Creatinine, urine, random  Add-on,   AD        08/09/23 0426   08/09/23 0427  CK  Add-on,   AD        08/09/23 0865  08/09/23 0003  Culture, blood (Routine x 2)  BLOOD CULTURE X 2,   R (with STAT occurrences)      08/09/23 0003            Vitals/Pain Today's Vitals   08/09/23 0100 08/09/23 0200 08/09/23 0242 08/09/23 0242  BP:  114/67    Pulse:  67 68   Resp:  17    Temp: 100.1  F (37.8 C)     TempSrc: Oral     SpO2:  96% 97%   Weight:      Height:      PainSc:    4     Isolation Precautions No active isolations  Medications Medications  lactated ringers infusion (has no administration in time range)  lactated ringers bolus 1,000 mL (0 mLs Intravenous Stopped 08/09/23 0334)    And  lactated ringers bolus 1,000 mL (0 mLs Intravenous Stopped 08/09/23 0335)    And  lactated ringers bolus 1,000 mL (1,000 mLs Intravenous New Bag/Given 08/09/23 0333)    And  lactated ringers bolus 500 mL (500 mLs Intravenous New Bag/Given 08/09/23 0416)  vancomycin (VANCOREADY) IVPB 1250 mg/250 mL (has no administration in time range)  acetaminophen (TYLENOL) tablet 650 mg (has no administration in time range)    Or  acetaminophen (TYLENOL) suppository 650 mg (has no administration in time range)  heparin injection 5,000 Units (has no administration in time range)  melatonin tablet 3 mg (has no administration in time range)  ondansetron (ZOFRAN) injection 4 mg (has no administration in time range)  naloxone (NARCAN) injection 0.4 mg (has no administration in time range)  fentaNYL (SUBLIMAZE) injection 50 mcg (has no administration in time range)  ceFEPIme (MAXIPIME) 2 g in sodium chloride 0.9 % 100 mL IVPB (has no administration in time range)  clopidogrel (PLAVIX) tablet 75 mg (has no administration in time range)  divalproex (DEPAKOTE ER) 24 hr tablet 1,000 mg (has no administration in time range)  DULoxetine (CYMBALTA) DR capsule 120 mg (has no administration in time range)  lamoTRIgine (LAMICTAL) tablet 200 mg (has no administration in time range)  rosuvastatin (CRESTOR) tablet 40 mg (has no administration in time range)  insulin aspart (novoLOG) injection 0-9 Units (has no administration in time range)  acetaminophen (TYLENOL) tablet 1,000 mg (1,000 mg Oral Given 08/09/23 0010)  lactated ringers bolus 1,000 mL (0 mLs Intravenous Stopped 08/09/23 0141)  vancomycin  (VANCOREADY) IVPB 2000 mg/400 mL ( Intravenous IV Pump Association 08/09/23 0237)    Mobility walks     Focused Assessments Patient is very tired and been sleeping Patient is alert and oriented. Family at bedside. Swelling to left extermity with redness noted.    R Recommendations: See Admitting Provider Note  Report given to: Kizzie Bane RN [phone]    Additional Notes:

## 2023-08-09 NOTE — ED Provider Notes (Signed)
[place] EMERGENCY DEPARTMENT AT [facility] Provider Note   CSN: [id number] Arrival date & time: 08/08/23  2342     History  Chief Complaint  Patient presents with   Knee Pain   Fever   Dizziness    Edwin [name] is a 67 y.o. male.  The history is provided by the patient and the spouse.   Patient w/history of bipolar, CKD, hypertension presents with fever and generalized weakness.  Patient reports he was recently treated for cellulitis of his left lower extremity and is on Bactrim.  He reports the redness and swelling has been there for quite some time and is not worsening.  He reports chills and fatigue.  No vomiting or diarrhea.  No cough or shortness of breath.  No new headache. No abdominal pain    Past Medical History:  Diagnosis Date   Allergic rhinitis    Bipolar disorder (HCC)    Bradycardia 2012    due to lithium   CKD (chronic kidney disease) stage 2, GFR 60-89 ml/min    Gout    Hypertension    Mild renal insufficiency    , with creatinine of 1.3 in 2010, was side effect of med   Obesity    ,moderate   Prostatitis    , Episodic   Stroke [facility])    Suicide attempt (HCC) 09/20/2014    Home Medications Prior to Admission medications   Medication Sig Start Date End Date Taking? Authorizing Provider  amLODipine (NORVASC) 5 MG tablet Take 5 mg by mouth daily. 05/28/23  Yes [provider]  Cholecalciferol 1000 units TBDP Take 1 tablet by mouth daily.    Yes [provider]  clopidogrel (PLAVIX) 75 MG tablet TAKE ONE TABLET BY MOUTH DAILY 04/23/23  Yes Butch Penny, NP  dapagliflozin propanediol (FARXIGA) 10 MG TABS tablet Take 10 mg by mouth daily.   Yes [provider]  divalproex (DEPAKOTE ER) 500 MG 24 hr tablet Take 2 tablets (1,000 mg total) by mouth at bedtime. 05/27/23  Yes Cottle, Steva Ready., MD  DULoxetine (CYMBALTA) 60 MG capsule Take 2 capsules (120 mg total) by mouth daily. 05/27/23  Yes Cottle, Steva Ready., MD   febuxostat (ULORIC) 40 MG tablet Take 40 mg by mouth daily.   Yes [provider]  furosemide (LASIX) 20 MG tablet Take 20 mg by mouth every other day. 07/21/19  Yes [provider]  gabapentin (NEURONTIN) 400 MG capsule Take 400 mg by mouth 2 (two) times daily.   Yes [provider]  hydrALAZINE (APRESOLINE) 25 MG tablet Take 50 mg by mouth 3 (three) times daily. 07/11/23  Yes [provider]  HYDROcodone-acetaminophen (NORCO/VICODIN) 5-325 MG tablet Take 1 tablet by mouth every 6 (six) hours as needed for moderate pain. 08/08/23  Yes Persons, [place], PA  KERENDIA 10 MG TABS Take 1 tablet by mouth daily. 03/18/23  Yes [provider]  lamoTRIgine (LAMICTAL) 200 MG tablet TAKE ONE TABLET BY MOUTH IN THE MORNING 06/30/23  Yes Cottle, Steva Ready., MD  LevOCARNitine (L-CARNITINE) 500 MG TABS Take 500 mg by mouth 2 (two) times daily.   Yes [provider]  magnesium oxide (MAG-OX) 400 (240 Mg) MG tablet Take 400 mg by mouth daily.   Yes [provider]  multivitamin-lutein (OCUVITE-LUTEIN) CAPS capsule Take 1 capsule by mouth daily.   Yes [provider]  pramipexole (MIRAPEX) 0.5 MG tablet Take 1 tablet (0.5 mg total) by mouth  2 (two) times daily. 05/27/23  Yes Cottle, Steva Ready., MD  rosuvastatin (CRESTOR) 40 MG tablet Take 1 tablet (40 mg total) by mouth daily. 06/28/23 09/26/23 Yes Jake Bathe, MD  sulfamethoxazole-trimethoprim (BACTRIM DS) 800-160 MG tablet Take 1 tablet by mouth 2 (two) times daily. For 10 days 08/02/23 08/13/23 Yes [provider]  tamsulosin (FLOMAX) 0.4 MG CAPS capsule Take 0.4 mg by mouth daily. 06/04/22  Yes [provider]  metFORMIN (GLUCOPHAGE) 500 MG tablet Take 500 mg by mouth daily with breakfast. Patient not taking: Reported on 08/09/2023    [provider]  pregabalin (LYRICA) 50 MG capsule Take 50 mg by mouth 2 (two) times daily.    [provider]   Semaglutide-Weight Management (WEGOVY) 0.25 MG/0.5ML SOAJ Inject 0.25 mg into the skin once a week. Patient not taking: Reported on 08/09/2023 07/04/23   Jake Bathe, MD  tadalafil (CIALIS) 20 MG tablet Take 20 mg by mouth daily as needed. Patient not taking: Reported on 08/09/2023 06/21/23   [provider]  VELTASSA 8.4 g packet Take 8.4 g by mouth See admin instructions. Take one packet by mouth three times a week. Days vary.    [provider]      Allergies    Allopurinol, Penicillins, Finerenone, Penicillin g, Aspirin, and Ciprofloxacin    Review of Systems   Review of Systems  Constitutional:  Positive for fatigue and fever.  Genitourinary:  Positive for frequency.  Skin:  Positive for color change.    Physical Exam Updated Vital Signs BP 114/67   Pulse 68   Temp 100.1 F (37.8 C) (Oral)   Resp 17   Ht 1.778 m (5\' 10" )   Wt 109 kg   SpO2 97%   BMI 34.48 kg/m  Physical Exam CONSTITUTIONAL: Chronically ill-appearing HEAD: Normocephalic/atraumatic ENMT: Mucous membranes moist NECK: supple no meningeal signs CV: S1/S2 noted, no murmurs/rubs/gallops noted LUNGS: decreased breath sounds bilaterally, patient on nasal cannula ABDOMEN: soft, nontender, no rebound or guarding, bowel sounds noted throughout abdomen GU:no cva tenderness NEURO: Pt is awake/alert/appropriate, moves all extremitiesx4.  No facial droop.   EXTREMITIES: pulses normal/equal, full ROM Erythema and tenderness noted left lower extremity.  Distal pulses intact.  No crepitus SKIN: warm, see photo PSYCH: no abnormalities of mood noted, alert and oriented to situation   ED Results / Procedures / Treatments   Labs (all labs ordered are listed, but only abnormal results are displayed) Labs Reviewed  COMPREHENSIVE METABOLIC PANEL - Abnormal; Notable for the following components:      Result Value   Sodium 133 (*)    Glucose, Bld 142 (*)    BUN 34 (*)    Creatinine, Ser 2.53 (*)     GFR, Estimated 27 (*)    All other components within normal limits  CBC WITH DIFFERENTIAL/PLATELET - Abnormal; Notable for the following components:   WBC 11.4 (*)    Neutro Abs 9.8 (*)    Lymphs Abs 0.6 (*)    All other components within normal limits  URINALYSIS, W/ REFLEX TO CULTURE (INFECTION SUSPECTED) - Abnormal; Notable for the following components:   Color, Urine STRAW (*)    Glucose, UA >=500 (*)    Hgb urine dipstick SMALL (*)    All other components within normal limits  I-STAT CG4 LACTIC ACID, ED - Abnormal; Notable for the following components:   Lactic Acid, Venous 2.0 (*)    All other components within normal limits  RESP  PANEL BY RT-PCR (RSV, FLU A&B, COVID)  [id number]  CULTURE, BLOOD (ROUTINE X 2)  CULTURE, BLOOD (ROUTINE X 2)  PROTIME-INR  CBC WITH DIFFERENTIAL/PLATELET  COMPREHENSIVE METABOLIC PANEL  I-STAT CG4 LACTIC ACID, ED    EKG EKG Interpretation Date/Time:  Friday August 09 2023 00:31:46 EDT Ventricular Rate:  92 PR Interval:  192 QRS Duration:  117 QT Interval:  342 QTC Calculation: 423 R Axis:   253  Text Interpretation: Sinus rhythm Incomplete right bundle branch block Interpretation limited secondary to artifact Confirmed by Zadie Rhine ([id number]) on 08/09/2023 12:45:19 AM  Radiology CT Chest Wo Contrast  Result Date: 08/09/2023 CLINICAL DATA:  Fever and cough, pneumonia suspected. Equivocal x-ray due to low lung volumes. EXAM: CT CHEST WITHOUT CONTRAST TECHNIQUE: Multidetector CT imaging of the chest was performed following the standard protocol without IV contrast. RADIATION DOSE REDUCTION: This exam was performed according to the departmental dose-optimization program which includes automated exposure control, adjustment of the mA and/or kV according to patient size and/or use of iterative reconstruction technique. COMPARISON:  AP Lat chest today, portable chest 07/01/2022, abdomen and pelvis CT no contrast 05/18/2016. No prior chest CT.  FINDINGS: Cardiovascular: The cardiac size is normal. There are patchy three-vessel coronary calcifications. There is no pericardial effusion. There is scattered aortic calcific plaque with mild tortuosity, without aortic aneurysm. The great vessels branch normally without visible significant plaques. The pulmonary veins are nondistended. There is a prominent pulmonary trunk measuring 3.3 cm indicating arterial hypertension with slightly prominent main pulmonary arteries. Mediastinum/Nodes: No enlarged mediastinal or axillary lymph nodes. Thyroid gland, trachea, and esophagus demonstrate no significant findings. There is a small hiatal hernia. The main bronchi are clear. Lungs/Pleura: Lung bases again demonstrate scattered linear scarring or atelectasis. There is no consolidation, effusion or pneumothorax. No pulmonary nodules are seen. Upper Abdomen: Mildly prominent spleen measuring 14 cm coronal. The liver is slightly steatotic. There is abdominal aortic atherosclerosis. No acute upper abdominal findings. Musculoskeletal: There is extensive thoracic spine bridging enthesopathy. Mild thoracic kyphosis. No acute or other significant osseous findings or suspicious regional bone lesion. Unremarkable chest wall. IMPRESSION: 1. No acute noncontrast chest CT findings. 2. Aortic and coronary artery atherosclerosis. 3. Small hiatal hernia. 4. Prominent pulmonary trunk and main pulmonary arteries. 5. Mild hepatic steatosis and mild splenomegaly. Aortic Atherosclerosis (ICD10-I70.0). Electronically Signed   By: Almira Bar M.D.   On: 08/09/2023 03:35   DG Chest 2 View  Result Date: 08/09/2023 CLINICAL DATA:  Lower leg cellulitis, fever EXAM: CHEST - 2 VIEW COMPARISON:  07/01/2022 FINDINGS: Low lung volumes. Suspected mild bibasilar atelectasis. No frank interstitial edema. No pleural effusion or pneumothorax. Heart is top-normal in size. Degenerative changes of the visualized thoracolumbar spine. IMPRESSION: Low lung  volumes. Suspected mild bibasilar atelectasis. Electronically Signed   By: Charline Bills M.D.   On: 08/09/2023 01:04    Procedures .Critical Care  Performed by: Zadie Rhine, MD Authorized by: Zadie Rhine, MD   Critical care provider statement:    Critical care time (minutes):  51   Critical care start time:  08/09/2023 2:09 AM   Critical care end time:  08/09/2023 3:00 AM   Critical care time was exclusive of:  Separately billable procedures and treating other patients   Critical care was necessary to treat or prevent imminent or life-threatening deterioration of the following conditions:  Sepsis, shock, dehydration, metabolic crisis and renal failure   Critical care was time spent personally by me on the following activities:  Obtaining  history from patient or surrogate, re-evaluation of patient's condition, pulse oximetry, ordering and review of radiographic studies, ordering and review of laboratory studies, ordering and performing treatments and interventions, development of treatment plan with patient or surrogate, evaluation of patient's response to treatment and examination of patient   I assumed direction of critical care for this patient from another provider in my specialty: no     Care discussed with: admitting provider       Medications Ordered in ED Medications  lactated ringers infusion (has no administration in time range)  lactated ringers bolus 1,000 mL (0 mLs Intravenous Stopped 08/09/23 0334)    And  lactated ringers bolus 1,000 mL (0 mLs Intravenous Stopped 08/09/23 0335)    And  lactated ringers bolus 1,000 mL (1,000 mLs Intravenous New Bag/Given 08/09/23 0333)    And  lactated ringers bolus 500 mL (has no administration in time range)  vancomycin (VANCOREADY) IVPB 2000 mg/400 mL ( Intravenous IV Pump [employer] 08/09/23 0237)  vancomycin (VANCOREADY) IVPB 1250 mg/250 mL (has no administration in time range)  acetaminophen (TYLENOL) tablet 650 mg (has no  administration in time range)    Or  acetaminophen (TYLENOL) suppository 650 mg (has no administration in time range)  heparin injection 5,000 Units (has no administration in time range)  melatonin tablet 3 mg (has no administration in time range)  ondansetron (ZOFRAN) injection 4 mg (has no administration in time range)  acetaminophen (TYLENOL) tablet 1,000 mg (1,000 mg Oral Given 08/09/23 0010)  lactated ringers bolus 1,000 mL (0 mLs Intravenous Stopped 08/09/23 0141)    ED Course/ Medical Decision Making/ A&P Clinical Course as of 08/09/23 0409  Fri Aug 09, 2023  0205 Creatinine(!): 2.53 Acute kidney injury [DW]  0205 WBC(!): 11.4 Leukocytosis [DW]  0211 Patient with fever and generalized weakness.  Does have cellulitis to his left leg but reports is not much worse and has been on Bactrim.  He appears to have a new oxygen requirement.  X-ray is unremarkable.  Will proceed with CT chest and then admit.  IV fluids antibiotics have been ordered [DW]  0407 Extensive workup is negative for pneumonia.  Patient has stabilized in the ER.  No abdominal complaints, therefore will defer any CT imaging of his abdomen.  Unclear cause of his hypoxia. At this time we will treat his source as cellulitis of the left leg that was unresponsive to outpatient Bactrim.  Patient has had the swelling for quite some time is had negative DVT previously [DW]  0408 Discussed with Dr. Arlean Hopping for admission [DW]    Clinical Course User Index [DW] Zadie Rhine, MD                                 Medical Decision Making Amount and/or Complexity of Data Reviewed Labs: ordered. Decision-making details documented in ED Course. Radiology: ordered.  Risk OTC drugs. Prescription drug management. Decision regarding hospitalization.  This patient presents to the ED for concern of fever, this involves an extensive number of treatment options, and is a complaint that carries with it a high risk of complications  and morbidity.  The differential diagnosis includes but is not limited to pneumonia, UTI, COVID-19, cellulitis, necrotizing fasciitis, meningitis  Comorbidities that complicate the patient evaluation: Patient's presentation is complicated by their history of bipolar hypertension  Additional history obtained: Additional history obtained from spouse Records reviewed  outpatient records reviewed  Lab Tests: I  Ordered, and personally interpreted labs.  The pertinent results include: Acute kidney injury, leukocytosis  Imaging Studies ordered: I ordered imaging studies including X-ray chest   I independently visualized and interpreted imaging which showed no acute findings I agree with the radiologist interpretation  Cardiac Monitoring: The patient was maintained on a cardiac monitor.  I personally viewed and interpreted the cardiac monitor which showed an underlying rhythm of:  sinus rhythm  Medicines ordered and prescription drug management: I ordered medication including IV fluids and antibiotics for sepsis Reevaluation of the patient after these medicines showed that the patient    improved  Critical Interventions:   IV fluids and antibiotics  Consultations Obtained: I requested consultation with the admitting physician [facility] , and discussed  findings as well as pertinent plan - they recommend: Admit  Reevaluation: After the interventions noted above, I reevaluated the patient and found that they have :improved  Complexity of problems addressed: Patient's presentation is most consistent with  acute presentation with potential threat to life or bodily function  Disposition: After consideration of the diagnostic results and the patient's response to treatment,  I feel that the patent would benefit from admission   .           Final Clinical Impression(s) / ED Diagnoses Final diagnoses:  AKI (acute kidney injury) (HCC)  Cellulitis of left lower extremity    Rx / DC  Orders ED Discharge Orders     None         Zadie Rhine, MD 08/09/23 0410

## 2023-08-09 NOTE — ED Notes (Signed)
Pt placed on 3 L [place] due to oxygen sat room air 88%-90%.

## 2023-08-09 NOTE — Sepsis Progress Note (Signed)
Following for sepsis monitoring ?

## 2023-08-09 NOTE — H&P (Signed)
History and Physical      Edwin [name] BJY:[id number] DOB: August 01, 1956 DOA: 08/08/2023; DOS: 08/09/2023  PCP: Deatra James, MD  Patient coming from: home   I have personally briefly reviewed patient's old medical records in [facility]  Chief Complaint: Left lower extremity erythema  HPI: Edwin [name] is a 67 y.o. male with medical history significant for essential hypertension, bipolar disorder, type 2 diabetes mellitus complicated by diabetic peripheral polyneuropathy, CKD 3B associated baseline creatinine range 1.7-2.0, who is admitted to [facility] on 08/08/2023 with severe sepsis due to severe cellulitis of the left lower extremity after presenting from home to [facility] ED complaining of left lower extremity erythema.   The patient reports that he was recently diagnosed with cellulitis of the left lower extremity but, in spite of good interval compliance with Bactrim as an outpatient, it is noted further worsening of the associated left lower extremity erythema, tenderness, increased warmth, swelling, prompting him to present to was in the emergency department this evening for further evaluation management thereof.  Not associate with any purulent drainage.  He notes associated objective fever at home as well as chills, in the absence of full body rigors or generalized myalgias.  Denies any associated acute focal weakness or any acute focal numbness/paresthesias involving the left lower extremity.  No recent trauma to the left lower extremity.  Otherwise, denies any rash.  Medical history notable for type 2 diabetes mellitus complicated by diabetic peripheral polyneuropathy.  Denies any recent dysuria or gross hematuria.  No recent headache, neck stiffness, shortness of breath, cough, chest pain, nor any recent abdominal discomfort or diarrhea.     ED Course:  Vital signs in the ED were notable for the following: Temperature max 102.6; heart rates in the 90s; systolic blood  pressures in the 1 teens 130s; respiratory rate 15-21, oxygen saturation 93 to 97% on room air.  Labs were notable for the following: CMP was notable for the following: Potassium 4.7, bicarbonate 23, creatinine 2.53 relative to most recent prior serum creatinine data point of 1.7-1 06/08/2023, glucose 141, liver enzymes within normal limits.  Initial lactic acid 2.0, with repeat value trending down to 0.8.  CBC notable for white blood cell count 11,400 with 87% neutrophils.  INR 1.0.  Urinalysis notable for no white blood cells as well as leukocyte esterase/nitrate negative, no RBCs, and no protein.  COVID, influenza, RSV PCR were all negative.  Blood cultures x 2 were collected prior to initiation of IV antibiotics.  Per my interpretation, EKG in ED demonstrated the following: Sinus rhythm with heart rate 92, incomplete right bundle branch block, and no evidence of T wave or ST changes, including no evidence of ST elevation.  Imaging in the ED, per corresponding formal radiology read, was notable for the following: CT chest without contrast showed no evidence of acute cardiopulmonary process, including no evidence of infiltrate, edema, effusion, or pneumothorax.  While in the ED, the following were administered: Acetaminophen 1 g p.o. x 1 dose, lactated Ringer's x 4.5 L bolus followed by initiation continuous LR at 150 cc/h.  IV vancomycin.  Subsequently, the patient was admitted for further evaluation management of presenting severe sepsis due to severe cellulitis of the left lower extremity, with presentation also notable for acute kidney injury superimposed on CKD 3B.    Review of Systems: As per HPI otherwise 10 point review of systems negative.   Past Medical History:  Diagnosis Date   Allergic rhinitis  Bipolar disorder (HCC)    Bradycardia 2012    due to lithium   CKD (chronic kidney disease) stage 2, GFR 60-89 ml/min    DM2 (diabetes mellitus, type 2) (HCC)    Gout    Hypertension     Mild renal insufficiency    , with creatinine of 1.3 in 2010, was side effect of med   Obesity    ,moderate   Prostatitis    , Episodic   Stroke [facility])    Suicide attempt (HCC) 09/20/2014    Past Surgical History:  Procedure Laterality Date   APPENDECTOMY     CATARACT EXTRACTION Right    COLONOSCOPY WITH PROPOFOL N/A 06/14/2014   Procedure: COLONOSCOPY WITH PROPOFOL;  Surgeon: Charolett Bumpers, MD;  Location: [facility] ENDOSCOPY;  Service: Endoscopy;  Laterality: N/A;   TONSILLECTOMY     UMBILICAL HERNIA REPAIR      Social History:  reports that he has never smoked. He has never been exposed to tobacco smoke. He has never used smokeless tobacco. He reports that he does not drink alcohol and does not use drugs.   Allergies  Allergen Reactions   Allopurinol Other (See Comments)    Made pt emotionally unstable  Other reaction(s): emotionally labile   Penicillins Hives and Other (See Comments)   Finerenone Other (See Comments)    hyperkalemia Other reaction(s): Increases  potassium levels   Penicillin G     Other reaction(s): hives   Aspirin Hives   Ciprofloxacin Rash    Other reaction(s): rash    Family History  Problem Relation Age of Onset   Other Mother        Viral Meningitis   Mitral valve prolapse Mother    Arrhythmia Mother    Hypothyroidism Mother    Stroke Mother    Hypertension Father    Gout Father    Alzheimer's disease Father     Family history reviewed and not pertinent    Prior to Admission medications   Medication Sig Start Date End Date Taking? Authorizing Provider  amLODipine (NORVASC) 5 MG tablet Take 5 mg by mouth daily. 05/28/23  Yes [provider]  Cholecalciferol 1000 units TBDP Take 1 tablet by mouth daily.    Yes [provider]  clopidogrel (PLAVIX) 75 MG tablet TAKE ONE TABLET BY MOUTH DAILY 04/23/23  Yes Butch Penny, NP  dapagliflozin propanediol (FARXIGA) 10 MG TABS tablet Take 10 mg by mouth daily.   Yes [provider]  divalproex (DEPAKOTE ER) 500 MG 24 hr tablet Take 2 tablets (1,000 mg total) by mouth at bedtime. 05/27/23  Yes Cottle, Steva Ready., MD  DULoxetine (CYMBALTA) 60 MG capsule Take 2 capsules (120 mg total) by mouth daily. 05/27/23  Yes Cottle, Steva Ready., MD  febuxostat (ULORIC) 40 MG tablet Take 40 mg by mouth daily.   Yes [provider]  furosemide (LASIX) 20 MG tablet Take 20 mg by mouth every other day. 07/21/19  Yes [provider]  gabapentin (NEURONTIN) 400 MG capsule Take 400 mg by mouth 2 (two) times daily.   Yes [provider]  hydrALAZINE (APRESOLINE) 25 MG tablet Take 50 mg by mouth 3 (three) times daily. 07/11/23  Yes [provider]  HYDROcodone-acetaminophen (NORCO/VICODIN) 5-325 MG tablet Take 1 tablet by mouth every 6 (six) hours as needed for moderate pain. 08/08/23  Yes Persons, [place], PA  KERENDIA 10 MG TABS Take 1 tablet by mouth daily. 03/18/23  Yes [provider]  lamoTRIgine (LAMICTAL) 200 MG tablet TAKE ONE TABLET BY MOUTH IN THE MORNING 06/30/23  Yes Cottle, Steva Ready., MD  LevOCARNitine (L-CARNITINE) 500 MG TABS Take 500 mg by mouth 2 (two) times daily.   Yes [provider]  magnesium oxide (MAG-OX) 400 (240 Mg) MG tablet Take 400 mg by mouth daily.   Yes [provider]  multivitamin-lutein (OCUVITE-LUTEIN) CAPS capsule Take 1 capsule by mouth daily.   Yes [provider]  pramipexole (MIRAPEX) 0.5 MG tablet Take 1 tablet (0.5 mg total) by mouth 2 (two) times daily. 05/27/23  Yes Cottle, Steva Ready., MD  rosuvastatin (CRESTOR) 40 MG tablet Take 1 tablet (40 mg total) by mouth daily. 06/28/23 09/26/23 Yes Jake Bathe, MD  sulfamethoxazole-trimethoprim (BACTRIM DS) 800-160 MG tablet Take 1 tablet by mouth 2 (two) times daily. For 10 days 08/02/23 08/13/23 Yes [provider]  tamsulosin (FLOMAX) 0.4 MG CAPS capsule Take 0.4 mg by mouth daily. 06/04/22  Yes [provider]   metFORMIN (GLUCOPHAGE) 500 MG tablet Take 500 mg by mouth daily with breakfast. Patient not taking: Reported on 08/09/2023    [provider]  pregabalin (LYRICA) 50 MG capsule Take 50 mg by mouth 2 (two) times daily.    [provider]  Semaglutide-Weight Management (WEGOVY) 0.25 MG/0.5ML SOAJ Inject 0.25 mg into the skin once a week. Patient not taking: Reported on 08/09/2023 07/04/23   Jake Bathe, MD  tadalafil (CIALIS) 20 MG tablet Take 20 mg by mouth daily as needed. Patient not taking: Reported on 08/09/2023 06/21/23   [provider]  VELTASSA 8.4 g packet Take 8.4 g by mouth See admin instructions. Take one packet by mouth three times a week. Days vary.    [provider]     Objective    Physical Exam: Vitals:   08/09/23 0032 08/09/23 0100 08/09/23 0200 08/09/23 0242  BP:   114/67   Pulse: 93  67 68  Resp: 15  17   Temp:  100.1 F (37.8 C)    TempSrc:  Oral    SpO2: 93%  96% 97%  Weight:      Height:        General: appears to be stated age; alert, oriented Skin: warm; erythema, tenderness, increased warmth, swelling associated with the medial aspect of the left lower extremity just proximal to the left ankle Head:  AT/ Mouth:  Oral mucosa membranes appear moist, normal dentition Neck: supple; trachea midline Heart:  RRR; did not appreciate any M/R/G Lungs: CTAB, did not appreciate any wheezes, rales, or rhonchi Abdomen: + BS; soft, ND, NT Vascular: 2+ pedal pulses b/l; 2+ radial pulses b/l Extremities: no muscle wasting; left lower extremity erythema, tenderness, increased warmth, tenderness, as further detailed above Neuro: strength and sensation intact in upper and lower extremities b/l    Labs on Admission: I have personally reviewed following labs and imaging studies  CBC: Recent Labs  Lab 08/09/23 0003  WBC 11.4*  NEUTROABS 9.8*  HGB 13.8  HCT 43.1  MCV 94.9  PLT 158   Basic Metabolic Panel: Recent Labs  Lab  08/09/23 0003  NA 133*  K 4.7  CL 98  CO2 23  GLUCOSE 142*  BUN 34*  CREATININE 2.53*  CALCIUM 9.3   GFR: Estimated Creatinine Clearance: 36 mL/min (A) (by C-G formula based on SCr of 2.53 mg/dL (H)). Liver Function Tests: Recent Labs  Lab 08/09/23 0003  AST 29  ALT  23  ALKPHOS 75  BILITOT 0.8  PROT 7.8  ALBUMIN 4.5   No results for input(s): "LIPASE", "AMYLASE" in the last 168 hours. No results for input(s): "AMMONIA" in the last 168 hours. Coagulation Profile: Recent Labs  Lab 08/09/23 0003  INR 1.0   Cardiac Enzymes: No results for input(s): "CKTOTAL", "CKMB", "CKMBINDEX", "TROPONINI" in the last 168 hours. BNP (last 3 results) No results for input(s): "PROBNP" in the last 8760 hours. HbA1C: No results for input(s): "HGBA1C" in the last 72 hours. CBG: No results for input(s): "GLUCAP" in the last 168 hours. Lipid Profile: No results for input(s): "CHOL", "HDL", "LDLCALC", "TRIG", "CHOLHDL", "LDLDIRECT" in the last 72 hours. Thyroid Function Tests: No results for input(s): "TSH", "T4TOTAL", "FREET4", "T3FREE", "THYROIDAB" in the last 72 hours. Anemia Panel: No results for input(s): "VITAMINB12", "FOLATE", "FERRITIN", "TIBC", "IRON", "RETICCTPCT" in the last 72 hours. Urine analysis:    Component Value Date/Time   COLORURINE STRAW (A) 08/09/2023 0130   APPEARANCEUR CLEAR 08/09/2023 0130   LABSPEC 1.007 08/09/2023 0130   PHURINE 6.0 08/09/2023 0130   GLUCOSEU >=500 (A) 08/09/2023 0130   HGBUR SMALL (A) 08/09/2023 0130   BILIRUBINUR NEGATIVE 08/09/2023 0130   KETONESUR NEGATIVE 08/09/2023 0130   PROTEINUR NEGATIVE 08/09/2023 0130   UROBILINOGEN 0.2 09/20/2014 0058   NITRITE NEGATIVE 08/09/2023 0130   LEUKOCYTESUR NEGATIVE 08/09/2023 0130    Radiological Exams on Admission: CT Chest Wo Contrast  Result Date: 08/09/2023 CLINICAL DATA:  Fever and cough, pneumonia suspected. Equivocal x-ray due to low lung volumes. EXAM: CT CHEST WITHOUT CONTRAST  TECHNIQUE: Multidetector CT imaging of the chest was performed following the standard protocol without IV contrast. RADIATION DOSE REDUCTION: This exam was performed according to the departmental dose-optimization program which includes automated exposure control, adjustment of the mA and/or kV according to patient size and/or use of iterative reconstruction technique. COMPARISON:  AP Lat chest today, portable chest 07/01/2022, abdomen and pelvis CT no contrast 05/18/2016. No prior chest CT. FINDINGS: Cardiovascular: The cardiac size is normal. There are patchy three-vessel coronary calcifications. There is no pericardial effusion. There is scattered aortic calcific plaque with mild tortuosity, without aortic aneurysm. The great vessels branch normally without visible significant plaques. The pulmonary veins are nondistended. There is a prominent pulmonary trunk measuring 3.3 cm indicating arterial hypertension with slightly prominent main pulmonary arteries. Mediastinum/Nodes: No enlarged mediastinal or axillary lymph nodes. Thyroid gland, trachea, and esophagus demonstrate no significant findings. There is a small hiatal hernia. The main bronchi are clear. Lungs/Pleura: Lung bases again demonstrate scattered linear scarring or atelectasis. There is no consolidation, effusion or pneumothorax. No pulmonary nodules are seen. Upper Abdomen: Mildly prominent spleen measuring 14 cm coronal. The liver is slightly steatotic. There is abdominal aortic atherosclerosis. No acute upper abdominal findings. Musculoskeletal: There is extensive thoracic spine bridging enthesopathy. Mild thoracic kyphosis. No acute or other significant osseous findings or suspicious regional bone lesion. Unremarkable chest wall. IMPRESSION: 1. No acute noncontrast chest CT findings. 2. Aortic and coronary artery atherosclerosis. 3. Small hiatal hernia. 4. Prominent pulmonary trunk and main pulmonary arteries. 5. Mild hepatic steatosis and mild  splenomegaly. Aortic Atherosclerosis (ICD10-I70.0). Electronically Signed   By: Almira Bar M.D.   On: 08/09/2023 03:35   DG Chest 2 View  Result Date: 08/09/2023 CLINICAL DATA:  Lower leg cellulitis, fever EXAM: CHEST - 2 VIEW COMPARISON:  07/01/2022 FINDINGS: Low lung volumes. Suspected mild bibasilar atelectasis. No frank interstitial edema. No pleural effusion or pneumothorax. Heart is top-normal in size.  Degenerative changes of the visualized thoracolumbar spine. IMPRESSION: Low lung volumes. Suspected mild bibasilar atelectasis. Electronically Signed   By: Charline Bills M.D.   On: 08/09/2023 01:04      Assessment/Plan    Principal Problem:   Cellulitis of left lower extremity Active Problems:   Essential hypertension   DM2 (diabetes mellitus, type 2) (HCC)   Bipolar disorder (HCC)   Severe sepsis (HCC)   Acute renal failure superimposed on stage 3b chronic kidney disease (HCC)     #) Severe sepsis due to severe cellulitis of left lower extremity: dx on basis of 1 week of progressive left lower extremity erythema associated with tenderness, increased warmth to the touch, swelling.  Nonpurulent, with these symptoms worsening in spite of outpatient use of Bactrim.    No evidence of crepitus on exam to suggest necrotizing fasciitis.  Within the confines of chronic diabetic peripheral polyneuropathy, left lower extremity is found to be neurovascularly intact.   Predisposing factors include the patient's underlying history of diabetes complicated by diabetic peripheral polyneuropathy.   SIRS criteria met via presenting objective fever, tachycardia, tachypnea, and mild leukocytosis with neutrophilic predominance, as quantified above.  Additionally, Lactic acid level: Initially noted to be 2.0, with repeat value trending down to 0.8. Of note, given the associated presence of suspected end organ damage in the form of concominant presenting elevated lactic acid as well as acute kidney  injury, criteria are met for pt's sepsis to be considered severe in nature.  In the ED, the patient has received greater than 30 mL/kg ivf bolus, as further quantified above.  Additional ED work-up/management notable for: Collection of blood cultures x 2 followed by initiation of IV vancomycin.  No e/o additional infectious process at this time, including CT chest which showed no evidence of acute cardiopulmonary process, including no evidence of infiltrate, while COVID, influenza, RSV PCR were all negative, and urinalysis is inconsistent with UTI.  presentation meets criteria to be considered of severe cellulitis due to associated sepsis. Overall, in the setting of presenting severe non-purulent cellulitis, will initiate IV Vanc and Cefepime per recommendation by antibiotic stewardship committee.   Plan: Abx in form of IV vancomycin and cefepime, as above. Follow for results of blood cultures x2 collected today. repeat CBC with diff in AM. I have placed nursing communication orders asking that current distribution of erythema be outlined, and that affected extremity be elevated to help decrease associated swelling.  Continuous lactated Ringer's, as above.  Prn IV fentanyl.  Prn acetaminophen for fever.                #) Acute Kidney Injury superimposed on CKD 3B: Relative to baseline creatinine range of 1.7-2.0, with most recent prior creatinine data point noted to be 1.72 on 06/08/2023, presenting serum creatinine found to be 2.53.  Suspect potential multifactorial contributions, including contribution from recent initiation of Bactrim serving as a potassium sparing diuretic in tandem with his existing outpatient potassium sparing diuretic in the form of finerenone.  Additionally, suspect independent prerenal contribution stemming from presenting severe sepsis due to underlying cellulitis of the left lower extremity, as above.  Of note, presenting urinalysis shows no evidence of significant  urinary cast.  Will also check CPK level in the context of use of high intensity rosuvastatin as an outpatient.   Plan: monitor strict I's & O's and daily weights. Attempt to avoid nephrotoxic agents. Refrain from NSAIDs. Repeat CMP in the morning. Check serum magnesium level. Add-on random urine  sodium and random urine creatinine.  Hold outpatient Bactrim as well as finerenone.  Further evaluation management of severe sepsis due to left lower extremity cellulitis, as above.  Add on CPK level.  Hold home gabapentin for now.                  #) Essential Hypertension: documented h/o such, with outpatient antihypertensive regimen including finerenone, hydralazine, Lasix, Norvasc.  SBP's in the ED today: 1 teens to 130s mmHg. however, in the setting of presenting severe sepsis, will hold outpatient hypertensive medications for now.  Plan: Close monitoring of subsequent BP via routine VS. hold outpatient antihypertensive medications for now, as above.                 #) Bipolar disorder: Documented history of such, with outpatient medication regimen includes Depakote, Lamictal, Cymbalta.  Plan: resume outpatient Depakote, Lamictal, Cymbalta.                 #) Type 2 Diabetes Mellitus: documented history of such. Home insulin regimen: None. Home oral hypoglycemic agents: Metformin, dapagliflozin.  Additionally, he is on semaglutide as an outpatient . presenting blood sugar: 141. Most recent A1c noted to be 5.3% when checked on 05/30/2023.  Complicated by diabetic peripheral polyneuropathy for which she is on gabapentin at home.  Plan: accuchecks QAC and HS with low dose SSI. hold home oral hypoglycemic agents during this hospitalization.  Hold home gabapentin for now in the setting of presenting acute kidney injury.        DVT prophylaxis: Heparin 5000 units SQ 3 times daily. Code Status: Full code Family Communication: none Disposition Plan: Per  Rounding Team Consults called: none;  Admission status: Inpatient    I SPENT GREATER THAN 75  MINUTES IN CLINICAL CARE TIME/MEDICAL DECISION-MAKING IN COMPLETING THIS ADMISSION.     Chaney Born Terril Amaro DO [facility] From 7PM - 7AM   08/09/2023, 4:45 AM

## 2023-08-09 NOTE — ED Triage Notes (Signed)
Pt reports with bilateral knee pain (chronic) pt is due to have a mri next Friday. Pt states that his provider is concerned for cellulitis to his left lower leg. Pt has redness and swelling. Pt also has a fever and does not feel well since today.

## 2023-08-09 NOTE — Progress Notes (Signed)
PROGRESS NOTE  Edwin [name] Found  DOB: September 20, 1956  PCP: Deatra James, MD VQQ:[id number]  DOA: 08/08/2023  LOS: 0 days  Hospital Day: 2  Brief narrative: Edwin [name] is a 67 y.o. male with PMH significant for obesity, DM2, HTN, stroke, CKD, diabetic polyneuropathy, bipolar disorder, gout 9/19, patient was seen at orthopedics office for 3 to 80-month history of increasing bilateral knee pain.  Recently started on Bactrim by PCP for treatment of for left leg cellulitis.  On exam, he was still noted to have persistent cellulitis findings as well as a fever.   He was sent to ED for further evaluation and management.  In the ED, patient had fever of 102.6, hemodynamically stable Labs with WBC count 11.4, sodium 133, glucose 142, BUN/creatinine 34/2.53,  lactic acid elevated to 2 >>0.8 Respiratory virus panel unremarkable Urinalysis clear straw-colored urine with negative ketones, negative nitrate, negative leukocytes Blood culture sent CT chest did not show any acute pulmonary findings.  In the ED, patient was given IV vancomycin, IV fluid, pain meds Admitted to [facility]  Subjective: Patient was seen and examined this afternoon.  Pleasant elderly Caucasian male.  Not in distress.  Taking his lunch.  Family not at bedside  Left leg cellulitis persists. Chart reviewed.   No recurrence of high fever this morning.  Remains hemodynamically stable.  Assessment and plan: Severe sepsis POA Cellulitis of left lower extremity Presented with fever and persistent left leg cellulitis despite use of Bactrim as an outpatient Noted to have liquid sepsis, lactic acidosis and AKI On initial workup, no other source of infection other than cellulitis identified Blood culture sent Started on IV vancomycin and IV cefepime Continue monitor Recent Labs  Lab 08/09/23 0003 08/09/23 0021 08/09/23 0216 08/09/23 1020  WBC 11.4*  --   --  13.0*  LATICACIDVEN  --  2.0* 0.8  --    AKI on CKD 3B Baseline  creatinine 1.7-2. Present with creatinine elevated 2.53 in the setting of sepsis as well as recent use of Bactrim.  Also on diuretics at home. Bactrim and diuretics on hold Continue to monitor creatinine with IV fluid Recent Labs    05/22/23 2249 05/29/23 1222 05/30/23 0427 05/31/23 0354 06/08/23 1133 08/09/23 0003 08/09/23 1020  BUN 58* 42* 40* 40* 35* 34* 28*  CREATININE 2.14* 1.86* 1.84* 2.09*  2.08* 1.72* 2.53* 2.28*   Chronic hyperkalemia Potassium currently in normal range.   It seems patient was on Veltassa 3 times a week. Will keep it on hold because of AKI Recent Labs  Lab 08/09/23 0003 08/09/23 1020  K 4.7 4.9   Bilateral knee pain H/o osteoarthritis, gout Follows up with orthopedics, last seen on 9/19 Noted a plan to obtain MRI as an outpatient PTA meds- Uloric 40 mg daily. Resume at a lower dose because of AKI. For pain control, I have started the patient on Tylenol 1 g 3 times daily and oxycodone 5 mg every 6 hours as needed.  Type 2 diabetes mellitus Diabetic polyneuropathy A1c 5.3 on July 2024 PTA meds-semaglutide weekly, Farxiga 10 mg daily, Keep oral meds on hold Start SSI/Accu-Cheks For neuropathy, patient was on Lyrica 50 mg twice daily.  It seems she was on gabapentin 4 mg twice daily as well. I will keep both on hold because of AKI Recent Labs  Lab 08/09/23 0837 08/09/23 1347  GLUCAP 88 102*   Essential hypertension PTA meds- Norvasc 5 mg daily, hydralazine 50 mg 3 times daily, Lasix 20 mg every  other day, finerenone 10 mg daily Blood pressure in normal range in 130s this morning. Will keep Lasix and finerenone on hold because of AKI Plan to resume Norvasc and hydralazine if blood pressure trends up  Aortic and coronary atherosclerosis  HLD PTA meds- Plavix 75 mg daily, Crestor 40 mg daily Continue both  Bipolar disorder PTA meds- Depakote 1000 mg nightly, Lamictal 200 mg daily, Cymbalta 120 mg daily. Continue all  Restless  legs Continue Mirapex 0.5 mg twice daily  BPH Continue Flomax 0.4 mg daily     Mobility: PT eval ordered  Goals of care   Code Status: Full Code     DVT prophylaxis:  heparin injection 5,000 Units Start: 08/09/23 0600   Antimicrobials: IV cefepime and vancomycin Fluid: NS at 75 mL/h Consultants: None Family Communication: None at bedside  Status: Inpatient Level of care:  Telemetry   Patient is from: Home Needs to continue in-hospital care: Needs IV antibiotics, renal function monitoring Anticipated d/c to: Pending clinical course, pending PT eval     Diet:  Diet Order             Diet regular Room service appropriate? Yes; Fluid consistency: Thin  Diet effective now                   Scheduled Meds:  acetaminophen  1,000 mg Oral TID   clopidogrel  75 mg Oral Daily   divalproex  1,000 mg Oral QHS   DULoxetine  120 mg Oral Daily   febuxostat  20 mg Oral Daily   heparin  5,000 Units Subcutaneous Q8H   insulin aspart  0-5 Units Subcutaneous QHS   insulin aspart  0-9 Units Subcutaneous TID WC   lamoTRIgine  200 mg Oral q morning   pramipexole  0.5 mg Oral BID   rosuvastatin  20 mg Oral Daily   tamsulosin  0.4 mg Oral Daily    PRN meds: melatonin, naLOXone (NARCAN)  injection, ondansetron (ZOFRAN) IV, oxyCODONE   Infusions:   sodium chloride 75 mL/hr at 08/09/23 0914   ceFEPime (MAXIPIME) IV 2 g (08/09/23 0707)   [START ON 08/10/2023] vancomycin      Antimicrobials: Anti-infectives (From admission, onward)    Start     Dose/Rate Route Frequency Ordered Stop   08/10/23 1400  vancomycin (VANCOREADY) IVPB 1250 mg/250 mL        1,250 mg 166.7 mL/hr over 90 Minutes Intravenous Every 36 hours 08/09/23 0302     08/09/23 0430  ceFEPIme (MAXIPIME) 2 g in sodium chloride 0.9 % 100 mL IVPB        2 g 200 mL/hr over 30 Minutes Intravenous Every 12 hours 08/09/23 0415     08/09/23 0215  vancomycin (VANCOREADY) IVPB 2000 mg/400 mL        2,000 mg 200 mL/hr  over 120 Minutes Intravenous  Once 08/09/23 0204 08/09/23 0416   08/09/23 0200  vancomycin (VANCOCIN) IVPB 1000 mg/200 mL premix  Status:  Discontinued        1,000 mg 200 mL/hr over 60 Minutes Intravenous  Once 08/09/23 0158 08/09/23 0204       Objective: Vitals:   08/09/23 1027 08/09/23 1323  BP: 139/68 (!) 151/75  Pulse: 91 88  Resp: 20 19  Temp: 99.6 F (37.6 C) (!) 101.1 F (38.4 C)  SpO2: 95% 93%    Intake/Output Summary (Last 24 hours) at 08/09/2023 1434 Last data filed at 08/09/2023 1000 Gross per 24 hour  Intake 4960  ml  Output 3600 ml  Net 1360 ml   Filed Weights   08/09/23 0004 08/09/23 0559  Weight: 109 kg 115.7 kg   Weight change:  Body mass index is 36.6 kg/m.   Physical Exam: General exam: Pleasant, elderly Caucasian male. Skin: No rashes, lesions or ulcers. HEENT: Atraumatic, normocephalic, no obvious bleeding Lungs: Clear to auscultation bilaterally CVS: Regular rate and rhythm, no murmur GI/Abd soft nontender, nondistended, bowel sound present CNS: Alert, awake, oriented x 3 Psychiatry: Sad affect Extremities: No pedal edema, left leg with redness, demarcated.   Data Review: I have personally reviewed the laboratory data and studies available.  F/u labs ordered Unresulted Labs (From admission, onward)     Start     Ordered   08/10/23 0500  Basic metabolic panel  Daily,   R     Question:  Specimen collection method  Answer:  IV Team=IV Team collect   08/09/23 1434   08/10/23 0500  CBC with Differential/Platelet  Daily,   R     Question:  Specimen collection method  Answer:  IV Team=IV Team collect   08/09/23 1434            Total time spent in review of labs and imaging, patient evaluation, formulation of plan, documentation and communication with family: 55 minutes  Signed, Lorin Glass, MD [facility] 08/09/2023

## 2023-08-09 NOTE — Plan of Care (Signed)
  Problem: Education: Goal: Ability to describe self-care measures that may prevent or decrease complications (Diabetes Survival Skills Education) will improve Outcome: Progressing Goal: Individualized Educational Video(s) Outcome: Progressing   

## 2023-08-09 NOTE — Plan of Care (Signed)
  Problem: Education: Goal: Ability to describe self-care measures that may prevent or decrease complications (Diabetes Survival Skills Education) will improve Outcome: Progressing   Problem: Coping: Goal: Ability to adjust to condition or change in health will improve Outcome: Progressing   Problem: Nutritional: Goal: Maintenance of adequate nutrition will improve Outcome: Progressing   Problem: Skin Integrity: Goal: Risk for impaired skin integrity will decrease Outcome: Progressing   Problem: Education: Goal: Knowledge of General Education information will improve Description: Including pain rating scale, medication(s)/side effects and non-pharmacologic comfort measures Outcome: Progressing   Problem: Activity: Goal: Risk for activity intolerance will decrease Outcome: Progressing

## 2023-08-10 DIAGNOSIS — L03116 Cellulitis of left lower limb: Secondary | ICD-10-CM | POA: Diagnosis not present

## 2023-08-10 LAB — BASIC METABOLIC PANEL
Anion gap: 9 (ref 5–15)
BUN: 34 mg/dL — ABNORMAL HIGH (ref 8–23)
CO2: 22 mmol/L (ref 22–32)
Calcium: 8.3 mg/dL — ABNORMAL LOW (ref 8.9–10.3)
Chloride: 105 mmol/L (ref 98–111)
Creatinine, Ser: 2.15 mg/dL — ABNORMAL HIGH (ref 0.61–1.24)
GFR, Estimated: 33 mL/min — ABNORMAL LOW (ref >60)
Glucose, Bld: 106 mg/dL — ABNORMAL HIGH (ref 70–99)
Potassium: 4 mmol/L (ref 3.5–5.1)
Sodium: 136 mmol/L (ref 135–145)

## 2023-08-10 LAB — CBC WITH DIFFERENTIAL/PLATELET
Abs Immature Granulocytes: 0.06 10*3/uL (ref 0.00–0.07)
Basophils Absolute: 0.1 10*3/uL (ref 0.0–0.1)
Basophils Relative: 1 %
Eosinophils Absolute: 0.2 10*3/uL (ref 0.0–0.5)
Eosinophils Relative: 1 %
HCT: 34.3 % — ABNORMAL LOW (ref 39.0–52.0)
Hemoglobin: 10.8 g/dL — ABNORMAL LOW (ref 13.0–17.0)
Immature Granulocytes: 1 %
Lymphocytes Relative: 12 %
Lymphs Abs: 1.4 10*3/uL (ref 0.7–4.0)
MCH: 30.9 pg (ref 26.0–34.0)
MCHC: 31.5 g/dL (ref 30.0–36.0)
MCV: 98.3 fL (ref 80.0–100.0)
Monocytes Absolute: 1.3 10*3/uL — ABNORMAL HIGH (ref 0.1–1.0)
Monocytes Relative: 11 %
Neutro Abs: 8.6 10*3/uL — ABNORMAL HIGH (ref 1.7–7.7)
Neutrophils Relative %: 74 %
Platelets: 129 10*3/uL — ABNORMAL LOW (ref 150–400)
RBC: 3.49 MIL/uL — ABNORMAL LOW (ref 4.22–5.81)
RDW: 13.3 % (ref 11.5–15.5)
WBC: 11.5 10*3/uL — ABNORMAL HIGH (ref 4.0–10.5)
nRBC: 0 % (ref 0.0–0.2)

## 2023-08-10 LAB — GLUCOSE, CAPILLARY
Glucose-Capillary: 129 mg/dL — ABNORMAL HIGH (ref 70–99)
Glucose-Capillary: 154 mg/dL — ABNORMAL HIGH (ref 70–99)
Glucose-Capillary: 96 mg/dL (ref 70–99)
Glucose-Capillary: 108 mg/dL — ABNORMAL HIGH (ref 70–99)

## 2023-08-10 NOTE — Plan of Care (Signed)
  Problem: Skin Integrity: Goal: Risk for impaired skin integrity will decrease Outcome: Adequate for Discharge   Problem: Tissue Perfusion: Goal: Adequacy of tissue perfusion will improve Outcome: Adequate for Discharge   Problem: Education: Goal: Knowledge of General Education information will improve Description: Including pain rating scale, medication(s)/side effects and non-pharmacologic comfort measures Outcome: Progressing   Problem: Health Behavior/Discharge Planning: Goal: Ability to manage health-related needs will improve Outcome: Adequate for Discharge   Problem: Clinical Measurements: Goal: Ability to maintain clinical measurements within normal limits will improve Outcome: Progressing Goal: Will remain free from infection Outcome: Progressing Goal: Diagnostic test results will improve Outcome: Progressing Goal: Respiratory complications will improve Outcome: Progressing Goal: Cardiovascular complication will be avoided Outcome: Progressing   Problem: Activity: Goal: Risk for activity intolerance will decrease Outcome: Adequate for Discharge   Problem: Nutrition: Goal: Adequate nutrition will be maintained Outcome: Adequate for Discharge   Problem: Coping: Goal: Level of anxiety will decrease Outcome: Progressing   Problem: Elimination: Goal: Will not experience complications related to bowel motility Outcome: Completed/Met Goal: Will not experience complications related to urinary retention Outcome: Completed/Met   Problem: Pain Managment: Goal: General experience of comfort will improve Outcome: Progressing   Problem: Safety: Goal: Ability to remain free from injury will improve Outcome: Progressing   Problem: Skin Integrity: Goal: Risk for impaired skin integrity will decrease Outcome: Progressing

## 2023-08-10 NOTE — Plan of Care (Signed)
Problem: Education: Goal: Ability to describe self-care measures that may prevent or decrease complications (Diabetes Survival Skills Education) will improve Outcome: Progressing   Problem: Coping: Goal: Ability to adjust to condition or change in health will improve Outcome: Progressing   Problem: Clinical Measurements: Goal: Ability to maintain clinical measurements within normal limits will improve Outcome: Progressing   Haydee Salter, RN 08/10/23 7:00 PM

## 2023-08-10 NOTE — Progress Notes (Signed)
PROGRESS NOTE  Edwin [name]  DOB: 02/16/1956  PCP: Deatra James, MD NUU:[id number]  DOA: 08/08/2023  LOS: 1 day  Hospital Day: 3  Brief narrative: Edwin [name] is a 67 y.o. male with PMH significant for obesity, DM2, HTN, stroke, CKD, diabetic polyneuropathy, bipolar disorder, gout 9/19, patient was seen at orthopedics office for 3 to 51-month history of increasing bilateral knee pain.  Recently started on Bactrim by PCP for treatment of for left leg cellulitis.  On exam, he was still noted to have persistent cellulitis findings as well as a fever.   He was sent to ED for further evaluation and management.  In the ED, patient had fever of 102.6, hemodynamically stable Labs with WBC count 11.4, sodium 133, glucose 142, BUN/creatinine 34/2.53,  lactic acid elevated to 2 >>0.8 Respiratory virus panel unremarkable Urinalysis clear straw-colored urine with negative ketones, negative nitrate, negative leukocytes Blood culture sent CT chest did not show any acute pulmonary findings.  In the ED, patient was given IV vancomycin, IV fluid, pain meds Admitted to [facility]  Subjective: Patient was seen and examined this morning.   Propped up in bed.  Not in distress.  Pending PT eval today. Left leg cellulitis improving.  Renal function improving.  Assessment and plan: Severe sepsis POA Cellulitis of left lower extremity Presented with fever and persistent left leg cellulitis despite use of Bactrim as an outpatient Noted to have liquid sepsis, lactic acidosis and AKI On initial workup, no other source of infection other than cellulitis identified Blood culture sent Started on IV vancomycin and IV cefepime No growth in blood cultures so far. Continue monitor Recent Labs  Lab 08/09/23 0003 08/09/23 0021 08/09/23 0216 08/09/23 1020 08/10/23 0353  WBC 11.4*  --   --  13.0* 11.5*  LATICACIDVEN  --  2.0* 0.8  --   --    AKI on CKD 3B Baseline creatinine 1.7-2. Present with creatinine  elevated 2.53 in the setting of sepsis as well as recent use of Bactrim.  Also on diuretics at home. Bactrim and diuretics on hold Creatinine gradually improving with IV fluid.  Continue to monitor. Recent Labs    05/22/23 2249 05/29/23 1222 05/30/23 0427 05/31/23 0354 06/08/23 1133 08/09/23 0003 08/09/23 1020 08/10/23 0353  BUN 58* 42* 40* 40* 35* 34* 28* 34*  CREATININE 2.14* 1.86* 1.84* 2.09*  2.08* 1.72* 2.53* 2.28* 2.15*   Chronic hyperkalemia Potassium currently in normal range.   It seems patient was on Veltassa 3 times a week. Will keep it on hold because of AKI Recent Labs  Lab 08/09/23 0003 08/09/23 1020 08/10/23 0353  K 4.7 4.9 4.0   Bilateral knee pain H/o osteoarthritis, gout Follows up with orthopedics, last seen on 9/19 Noted a plan to obtain MRI as an outpatient PTA meds- Uloric 40 mg daily. Resume at a lower dose because of AKI. For pain control, I have started the patient on Tylenol 1 g 3 times daily and oxycodone 5 mg every 6 hours as needed.  Type 2 diabetes mellitus Diabetic polyneuropathy A1c 5.3 on July 2024 PTA meds-semaglutide weekly, Farxiga 10 mg daily, Keep oral meds on hold Start SSI/Accu-Cheks For neuropathy, patient was on Lyrica 50 mg twice daily.  It seems she was on gabapentin 400 mg twice daily as well. I will keep both on hold because of AKI Recent Labs  Lab 08/09/23 0837 08/09/23 1347 08/09/23 1615 08/09/23 2107 08/10/23 0733  GLUCAP 88 102* 143* 138* 129*   Essential  hypertension PTA meds- Norvasc 5 mg daily, hydralazine 50 mg 3 times daily, Lasix 20 mg every other day, finerenone 10 mg daily Blood pressure remains in normal range on Norvasc and hydralazine. Currently continue to hold Lasix and finerenone because of AKI  Aortic and coronary atherosclerosis  HLD PTA meds- Plavix 75 mg daily, Crestor 40 mg daily Continue both  Bipolar disorder PTA meds- Depakote 1000 mg nightly, Lamictal 200 mg daily, Cymbalta 120 mg  daily. Continue all  Restless legs Continue Mirapex 0.5 mg twice daily  BPH Continue Flomax 0.4 mg daily     Mobility: PT eval pending  Goals of care   Code Status: Full Code     DVT prophylaxis:  heparin injection 5,000 Units Start: 08/09/23 0600   Antimicrobials: IV cefepime and vancomycin Fluid: NS at 75 mL/h to continue Consultants: None Family Communication: None at bedside  Status: Inpatient Level of care:  Telemetry   Patient is from: Home Needs to continue in-hospital care: Needs IV antibiotics, renal function monitoring Anticipated d/c to: Pending clinical course, pending PT eval     Diet:  Diet Order             Diet regular Room service appropriate? Yes; Fluid consistency: Thin  Diet effective now                   Scheduled Meds:  acetaminophen  1,000 mg Oral TID   clopidogrel  75 mg Oral Daily   divalproex  1,000 mg Oral QHS   DULoxetine  120 mg Oral Daily   febuxostat  20 mg Oral Daily   heparin  5,000 Units Subcutaneous Q8H   insulin aspart  0-5 Units Subcutaneous QHS   insulin aspart  0-9 Units Subcutaneous TID WC   lamoTRIgine  200 mg Oral q morning   pramipexole  0.5 mg Oral BID   rosuvastatin  20 mg Oral Daily   tamsulosin  0.4 mg Oral Daily    PRN meds: melatonin, naLOXone (NARCAN)  injection, ondansetron (ZOFRAN) IV, oxyCODONE   Infusions:   sodium chloride Stopped (08/10/23 0510)   ceFEPime (MAXIPIME) IV 2 g (08/10/23 0510)   vancomycin      Antimicrobials: Anti-infectives (From admission, onward)    Start     Dose/Rate Route Frequency Ordered Stop   08/10/23 1400  vancomycin (VANCOREADY) IVPB 1250 mg/250 mL        1,250 mg 166.7 mL/hr over 90 Minutes Intravenous Every 36 hours 08/09/23 0302     08/09/23 0430  ceFEPIme (MAXIPIME) 2 g in sodium chloride 0.9 % 100 mL IVPB        2 g 200 mL/hr over 30 Minutes Intravenous Every 12 hours 08/09/23 0415     08/09/23 0215  vancomycin (VANCOREADY) IVPB 2000 mg/400 mL         2,000 mg 200 mL/hr over 120 Minutes Intravenous  Once 08/09/23 0204 08/09/23 0416   08/09/23 0200  vancomycin (VANCOCIN) IVPB 1000 mg/200 mL premix  Status:  Discontinued        1,000 mg 200 mL/hr over 60 Minutes Intravenous  Once 08/09/23 0158 08/09/23 0204       Objective: Vitals:   08/09/23 2103 08/10/23 0510  BP: 101/60 107/67  Pulse: 71 61  Resp: 18 18  Temp: 98.4 F (36.9 C) 98.3 F (36.8 C)  SpO2: 98% 98%    Intake/Output Summary (Last 24 hours) at 08/10/2023 1139 Last data filed at 08/10/2023 1000 Gross per 24 hour  Intake 2698.43 ml  Output 2550 ml  Net 148.43 ml   Filed Weights   08/09/23 0004 08/09/23 0559 08/10/23 0510  Weight: 109 kg 115.7 kg 111.6 kg   Weight change: 2.585 kg Body mass index is 35.3 kg/m.   Physical Exam: General exam: Pleasant, elderly Caucasian male.  Not in physical distress at rest. Skin: No rashes, lesions or ulcers. HEENT: Atraumatic, normocephalic, no obvious bleeding Lungs: Clear to auscultation bilaterally CVS: Regular rate and rhythm, no murmur GI/Abd soft nontender, nondistended, bowel sound present CNS: Alert, awake, oriented x 3 Psychiatry: Mood appropriate Extremities: No pedal edema, left leg with redness, demarcated.   Data Review: I have personally reviewed the laboratory data and studies available.  F/u labs ordered Unresulted Labs (From admission, onward)     Start     Ordered   08/10/23 0500  Basic metabolic panel  Daily,   R     Question:  Specimen collection method  Answer:  IV Team=IV Team collect   08/09/23 1434   08/10/23 0500  CBC with Differential/Platelet  Daily,   R     Question:  Specimen collection method  Answer:  IV Team=IV Team collect   08/09/23 1434            Total time spent in review of labs and imaging, patient evaluation, formulation of plan, documentation and communication with family: 45 minutes  Signed, Lorin Glass, MD [facility] 08/10/2023

## 2023-08-10 NOTE — Evaluation (Signed)
Physical Therapy Evaluation Patient Details Name: Edwin [name] MRN: [id number] DOB: 02/25/1956 Today's Date: 08/10/2023  History of Present Illness  67 y.o. male adm with  sepsis ,  Cellulitis of left lower extremity.  PMH significant for obesity, DM2, HTN, stroke, CKD, diabetic polyneuropathy, bipolar disorder, gout  9/19  Clinical Impression  Patient evaluated by Physical Therapy with no further acute PT needs identified. All education has been completed and the patient has no further questions.  See below for any follow-up Physical Therapy or equipment needs. PT is signing off. Thank you for this referral.         If plan is discharge home, recommend the following: Help with stairs or ramp for entrance   Can travel by private vehicle        Equipment Recommendations None recommended by PT  Recommendations for Other Services       Functional Status Assessment Patient has not had a recent decline in their functional status     Precautions / Restrictions Precautions Precautions: Fall Restrictions Weight Bearing Restrictions: No      Mobility  Bed Mobility               General bed mobility comments: pt in recliner and returned to same    Transfers Overall transfer level: Needs assistance Equipment used: Rolling walker (2 wheels) Transfers: Sit to/from Stand Sit to Stand: Supervision, Contact guard assist           General transfer comment: cues for hand  placement    Ambulation/Gait Ambulation/Gait assistance: Supervision, Contact guard assist Gait Distance (Feet): 360 Feet Assistive device: None Gait Pattern/deviations: Step-through pattern, Decreased stance time - left       General Gait Details: CGA to supervision for safety, unsteady initially  however no overt LOB  Stairs            Wheelchair Mobility     Tilt Bed    Modified Rankin (Stroke Patients Only)       Balance Overall balance assessment: Mild deficits observed, not  formally tested                                           Pertinent Vitals/Pain Pain Assessment Pain Assessment: Faces Faces Pain Scale: Hurts a little bit Pain Location: L LE Pain Descriptors / Indicators: Tightness Pain Intervention(s): Limited activity within patient's tolerance, Monitored during session, Premedicated before session, Repositioned    Home Living Family/patient expects to be discharged to:: Private residence Living Arrangements: Spouse/significant other Available Help at Discharge: Friend(s);Neighbor;Available PRN/intermittently Type of Home: House Home Access: Stairs to enter   Entrance Stairs-Number of Steps: 3   Home Layout: One level Home Equipment: Grab bars - tub/shower;Educational psychologist (4 wheels)      Prior Function Prior Level of Function : Independent/Modified Independent;Driving             Mobility Comments: last fall d/t cat tripping him, no injurires ADLs Comments: Works as Engineer, agricultural, Independent with ADLs, IADLs, drives, grocery shops and goes out to eat a lot     Extremity/Trunk Assessment   Upper Extremity Assessment Upper Extremity Assessment: Overall WFL for tasks assessed    Lower Extremity Assessment Lower Extremity Assessment: LLE deficits/detail LLE Deficits / Details: grossly WFL; ankle &  foot  edematous       Communication   Communication Communication: No apparent difficulties  Cognition Arousal: Alert Behavior During Therapy: WFL for tasks assessed/performed Overall Cognitive Status: Within Functional Limits for tasks assessed                                          General Comments      Exercises     Assessment/Plan    PT Assessment Patient does not need any further PT services  PT Problem List         PT Treatment Interventions      PT Goals (Current goals can be found in the Care Plan section)  Acute Rehab PT Goals PT Goal Formulation: All assessment  and education complete, DC therapy    Frequency       Co-evaluation               AM-PAC PT "6 Clicks" Mobility  Outcome Measure Help needed turning from your back to your side while in a flat bed without using bedrails?: None Help needed moving from lying on your back to sitting on the side of a flat bed without using bedrails?: None Help needed moving to and from a bed to a chair (including a wheelchair)?: None Help needed standing up from a chair using your arms (e.g., wheelchair or bedside chair)?: None Help needed to walk in hospital room?: None Help needed climbing 3-5 steps with a railing? : A Little 6 Click Score: 23    End of Session Equipment Utilized During Treatment: Gait belt Activity Tolerance: Patient tolerated treatment well Patient left: in chair;with call bell/phone within reach;with family/visitor present Nurse Communication: Mobility status PT Visit Diagnosis: Other abnormalities of gait and mobility (R26.89)    Time: 1610-9604 PT Time Calculation (min) (ACUTE ONLY): 14 min   Charges:   PT Evaluation $PT Eval Low Complexity: 1 Low   PT General Charges $$ ACUTE PT VISIT: 1 Visit         Khushboo Chuck, PT  Acute Rehab Dept ([facility]) [phone]  08/10/2023   [facility] 08/10/2023, 2:57 PM

## 2023-08-11 DIAGNOSIS — L03116 Cellulitis of left lower limb: Secondary | ICD-10-CM | POA: Diagnosis not present

## 2023-08-11 LAB — CBC WITH DIFFERENTIAL/PLATELET
Abs Immature Granulocytes: 0.03 10*3/uL (ref 0.00–0.07)
Basophils Absolute: 0 10*3/uL (ref 0.0–0.1)
Basophils Relative: 1 %
Eosinophils Absolute: 0.5 10*3/uL (ref 0.0–0.5)
Eosinophils Relative: 6 %
HCT: 37.2 % — ABNORMAL LOW (ref 39.0–52.0)
Hemoglobin: 11.7 g/dL — ABNORMAL LOW (ref 13.0–17.0)
Immature Granulocytes: 0 %
Lymphocytes Relative: 14 %
Lymphs Abs: 1.2 10*3/uL (ref 0.7–4.0)
MCH: 31 pg (ref 26.0–34.0)
MCHC: 31.5 g/dL (ref 30.0–36.0)
MCV: 98.4 fL (ref 80.0–100.0)
Monocytes Absolute: 1.1 10*3/uL — ABNORMAL HIGH (ref 0.1–1.0)
Monocytes Relative: 14 %
Neutro Abs: 5.4 10*3/uL (ref 1.7–7.7)
Neutrophils Relative %: 65 %
Platelets: 128 10*3/uL — ABNORMAL LOW (ref 150–400)
RBC: 3.78 MIL/uL — ABNORMAL LOW (ref 4.22–5.81)
RDW: 13.5 % (ref 11.5–15.5)
WBC: 8.3 10*3/uL (ref 4.0–10.5)
nRBC: 0 % (ref 0.0–0.2)

## 2023-08-11 LAB — BASIC METABOLIC PANEL
Anion gap: 9 (ref 5–15)
BUN: 29 mg/dL — ABNORMAL HIGH (ref 8–23)
CO2: 22 mmol/L (ref 22–32)
Calcium: 8.5 mg/dL — ABNORMAL LOW (ref 8.9–10.3)
Chloride: 108 mmol/L (ref 98–111)
Creatinine, Ser: 1.86 mg/dL — ABNORMAL HIGH (ref 0.61–1.24)
GFR, Estimated: 40 mL/min — ABNORMAL LOW (ref >60)
Glucose, Bld: 100 mg/dL — ABNORMAL HIGH (ref 70–99)
Potassium: 4.6 mmol/L (ref 3.5–5.1)
Sodium: 139 mmol/L (ref 135–145)

## 2023-08-11 LAB — GLUCOSE, CAPILLARY: Glucose-Capillary: 75 mg/dL (ref 70–99)

## 2023-08-11 MED ORDER — CEFADROXIL 500 MG PO CAPS: 500.0000 mg | ORAL_CAPSULE | Freq: Two times a day (BID) | ORAL | 0 refills | Status: AC

## 2023-08-11 MED ORDER — VANCOMYCIN HCL IN DEXTROSE 1-5 GM/200ML-% IV SOLN
1000.0000 mg | INTRAVENOUS | Status: DC
  Filled 2023-08-11: qty 200

## 2023-08-11 MED ORDER — CEFADROXIL 500 MG PO CAPS
500.0000 mg | ORAL_CAPSULE | Freq: Two times a day (BID) | ORAL | Status: DC
  Administered 2023-08-11: 500 mg via ORAL
  Filled 2023-08-11: qty 1

## 2023-08-11 MED ORDER — SACCHAROMYCES BOULARDII 250 MG PO CAPS: 250.0000 mg | ORAL_CAPSULE | Freq: Two times a day (BID) | ORAL | 0 refills | Status: AC

## 2023-08-11 MED ORDER — ACETAMINOPHEN 500 MG PO TABS: 1000.0000 mg | ORAL_TABLET | Freq: Three times a day (TID) | ORAL | 2 refills | Status: AC

## 2023-08-11 NOTE — Progress Notes (Signed)
PHARMACY NOTE:  ANTIMICROBIAL RENAL DOSAGE ADJUSTMENT  Current antimicrobial regimen includes a mismatch between antimicrobial dosage and estimated renal function.  As per policy approved by the Pharmacy & Therapeutics and Medical Executive Committees, the antimicrobial dosage will be adjusted accordingly.  Current antimicrobial dosage: vancomycin 1250 mg IV q36h  Indication: cellulitis  Renal Function:  Estimated Creatinine Clearance: 49.5 mL/min (A) (by C-G formula based on SCr of 1.86 mg/dL (H)).     Antimicrobial dosage has been changed to: vancomycin 1000 mg IV q24h   Thank you for allowing pharmacy to be a part of this patient's care.  Pricilla Riffle, PharmD, BCPS Clinical Pharmacist 08/11/2023 9:04 AM

## 2023-08-11 NOTE — Discharge Summary (Signed)
Physician Discharge Summary  Edwin [name] UVO:[id number] DOB: 1956-06-27 DOA: 08/08/2023  PCP: Deatra James, MD  Admit date: 08/08/2023 Discharge date: 08/11/2023  Admitted From: Home Discharge disposition: Home  Recommendations at discharge:  Continue scheduled Tylenol for pain Continue cefadroxil with probiotics for 5 more days to complete the course of antibiotics Follow-up with orthopedics for scheduled MRI next week Follow-up with nephrologist as an outpatient Your blood pressure medicines have been adjusted.  Stop hydralazine.  Continue others.   Brief narrative: Edwin [name] is a 67 y.o. male with PMH significant for obesity, DM2, HTN, stroke, CKD, diabetic polyneuropathy, bipolar disorder, gout 9/19, patient was seen at orthopedics office for 3 to 55-month history of increasing bilateral knee pain.  Recently started on Bactrim by PCP for treatment of for left leg cellulitis.  On exam, he was still noted to have persistent cellulitis findings as well as a fever.   He was sent to ED for further evaluation and management.  In the ED, patient had fever of 102.6, hemodynamically stable Labs with WBC count 11.4, sodium 133, glucose 142, BUN/creatinine 34/2.53,  lactic acid elevated to 2 >>0.8 Respiratory virus panel unremarkable Urinalysis clear straw-colored urine with negative ketones, negative nitrate, negative leukocytes Blood culture sent CT chest did not show any acute pulmonary findings.  In the ED, patient was given IV vancomycin, IV fluid, pain meds Admitted to [facility]  Subjective: Patient was seen and examined this morning.  Lying on bed.  Not in distress.  Feels better.  Cellulitis improving.  Multiple family members at bedside.  We had a long conversation about patient's medical issues and discharge plan.  All questions answered to satisfaction.  Ready to be discharged today.  Assessment and plan: Severe sepsis POA Cellulitis of left lower extremity Presented with  fever and persistent left leg cellulitis despite use of Bactrim as an outpatient Noted to have liquid sepsis, lactic acidosis and AKI On initial workup, no other source of infection other than cellulitis identified Blood culture sent Started on IV vancomycin and IV cefepime No growth in blood cultures so far. Clinically improving.  WBC count normalized, lactic acid level normalized.  Plan to discharge home on cefadroxil for 5 more days with probiotics Recent Labs  Lab 08/09/23 0003 08/09/23 0021 08/09/23 0216 08/09/23 1020 08/10/23 0353 08/11/23 0336  WBC 11.4*  --   --  13.0* 11.5* 8.3  LATICACIDVEN  --  2.0* 0.8  --   --   --    AKI on CKD 3B Baseline creatinine 1.7-2. Present with creatinine elevated 2.53 in the setting of sepsis as well as recent use of Bactrim.  Was also on diuretics at home. Bactrim and diuretics were held.  Given adequate IV hydration.  Creatinine gradually improved.  Down to 1.86 today close to baseline. Patient follows up with nephrologist Dr. Thedore Mins at [facility]. Recent Labs    05/22/23 2249 05/29/23 1222 05/30/23 0427 05/31/23 0354 06/08/23 1133 08/09/23 0003 08/09/23 1020 08/10/23 0353 08/11/23 0336  BUN 58* 42* 40* 40* 35* 34* 28* 34* 29*  CREATININE 2.14* 1.86* 1.84* 2.09*  2.08* 1.72* 2.53* 2.28* 2.15* 1.86*   Chronic hyperkalemia Potassium currently in normal range.   It seems patient was on Veltassa 3 times a week.  Resume the same post discharge. Recent Labs  Lab 08/09/23 0003 08/09/23 1020 08/10/23 0353 08/11/23 0336  K 4.7 4.9 4.0 4.6   Bilateral knee pain H/o osteoarthritis, gout Follows up with orthopedics, last seen on  9/19 Noted a plan to obtain MRI as an outpatient PTA meds- Uloric 40 mg daily.  Continue same. For pain control, I have started the patient on Tylenol 1 g 3 times daily.  Okay to continue PRN hydrocodone as before.  Type 2 diabetes mellitus Diabetic polyneuropathy A1c 5.3 on July 2024 PTA meds -  Farxiga 10 mg daily, Continue the same For neuropathy, patient was on Lyrica 50 mg twice daily.  It seems she was on gabapentin 400 mg twice daily as well.   Post discharge continue as before Recent Labs  Lab 08/10/23 0733 08/10/23 1140 08/10/23 1651 08/10/23 2108 08/11/23 0758  GLUCAP 129* 154* 96 108* 75   Essential hypertension PTA meds- Norvasc 5 mg daily, hydralazine 50 mg 3 times daily, Lasix 20 mg every other day, finerenone 10 mg daily Currently not on any BP meds. Blood pressure rising to 150s today. Post discharge, resume amlodipine, Lasix and finerenone as before.  I would hold hydralazine and resume later if blood pressure runs elevated over 140  Aortic and coronary atherosclerosis  HLD PTA meds- Plavix 75 mg daily, Crestor 40 mg daily Continue both  Bipolar disorder PTA meds- Depakote 1000 mg nightly, Lamictal 200 mg daily, Cymbalta 120 mg daily. Continue all  Restless legs Continue Mirapex 0.5 mg twice daily  BPH Continue Flomax 0.4 mg daily   Mobility: PT eval obtained.  No follow-up recommended  Goals of care   Code Status: Full Code   Wounds:  -    Discharge Exam:   Vitals:   08/10/23 1308 08/10/23 2105 08/11/23 0438 08/11/23 1034  BP: 103/73 127/78 (!) 153/92 131/76  Pulse: 60 (!) 58 64 63  Resp: 15 18 20 18   Temp: 97.8 F (36.6 C) 97.9 F (36.6 C) 97.9 F (36.6 C) 98.5 F (36.9 C)  TempSrc:  Oral Oral   SpO2: 91% 98% 94% 95%  Weight:      Height:        Body mass index is 35.3 kg/m.   General exam: Pleasant, elderly Caucasian male.  Not in physical distress at rest. Skin: No rashes, lesions or ulcers. HEENT: Atraumatic, normocephalic, no obvious bleeding Lungs: Clear to auscultation bilaterally CVS: Regular rate and rhythm, no murmur GI/Abd soft nontender, nondistended, bowel sound present CNS: Alert, awake, oriented x 3 Psychiatry: Mood appropriate Extremities: No pedal edema, left leg with redness, demarcated.   Follow  ups:    Follow-up Information     Deatra James, MD Follow up.   Specialty: Family Medicine Contact information: [phone] [address] [place] [address] [phone]                 Discharge Instructions:   Discharge Instructions     Call MD for:  difficulty breathing, headache or visual disturbances   Complete by: As directed    Call MD for:  extreme fatigue   Complete by: As directed    Call MD for:  hives   Complete by: As directed    Call MD for:  persistant dizziness or light-headedness   Complete by: As directed    Call MD for:  persistant nausea and vomiting   Complete by: As directed    Call MD for:  severe uncontrolled pain   Complete by: As directed    Call MD for:  temperature >100.4   Complete by: As directed    Diet general   Complete by: As directed    Discharge instructions  Complete by: As directed    Recommendations at discharge:   Continue scheduled Tylenol for pain  Continue cefadroxil with probiotics for 5 more days to complete the course of antibiotics  Follow-up with orthopedics for scheduled MRI next week  Follow-up with nephrologist as an outpatient  Your blood pressure medicines have been adjusted.  Stop hydralazine.  Continue others.  Discharge instructions for diabetes mellitus: Check blood sugar 3 times a day and bedtime at home. If blood sugar running above 200 or less than 70 please call your MD to adjust insulin. If you notice signs and symptoms of hypoglycemia (low blood sugar) like jitteriness, confusion, thirst, tremor and sweating, please check blood sugar, drink sugary drink/biscuits/sweets to increase sugar level and call MD or return to ER.    General discharge instructions: Follow with Primary MD Deatra James, MD in 7 days  Please request your PCP  to go over your hospital tests, procedures, radiology results at the follow up. Please get your medicines reviewed and adjusted.  Your PCP may decide to repeat  certain labs or tests as needed. Do not drive, operate heavy machinery, perform activities at heights, swimming or participation in water activities or provide baby sitting services if your were admitted for syncope or siezures until you have seen by Primary MD or a Neurologist and advised to do so again. [place] Controlled Substance Reporting System database was reviewed. Do not drive, operate heavy machinery, perform activities at heights, swim, participate in water activities or provide baby-sitting services while on medications for pain, sleep and mood until your outpatient physician has reevaluated you and advised to do so again.  You are strongly recommended to comply with the dose, frequency and duration of prescribed medications. Activity: As tolerated with Full fall precautions use walker/cane & assistance as needed Avoid using any recreational substances like cigarette, tobacco, alcohol, or non-prescribed drug. If you experience worsening of your admission symptoms, develop shortness of breath, life threatening emergency, suicidal or homicidal thoughts you must seek medical attention immediately by calling 911 or calling your MD immediately  if symptoms less severe. You must read complete instructions/literature along with all the possible adverse reactions/side effects for all the medicines you take and that have been prescribed to you. Take any new medicine only after you have completely understood and accepted all the possible adverse reactions/side effects.  Wear Seat belts while driving. You were cared for by a hospitalist during your hospital stay. If you have any questions about your discharge medications or the care you received while you were in the hospital after you are discharged, you can call the unit and ask to speak with the hospitalist or the covering physician. Once you are discharged, your primary care physician will handle any further medical issues. Please note that NO  REFILLS for any discharge medications will be authorized once you are discharged, as it is imperative that you return to your primary care physician (or establish a relationship with a primary care physician if you do not have one).   Increase activity slowly   Complete by: As directed        Discharge Medications:   Allergies as of 08/11/2023       Reactions   Allopurinol Other (See Comments)   Made pt emotionally unstable  Other reaction(s): emotionally labile   Penicillins Hives, Other (See Comments)   Finerenone Other (See Comments)   hyperkalemia Other reaction(s): Increases  potassium levels   Penicillin G    Other  reaction(s): hives   Aspirin Hives   Ciprofloxacin Rash   Other reaction(s): rash        Medication List     STOP taking these medications    metFORMIN 500 MG tablet Commonly known as: GLUCOPHAGE   sulfamethoxazole-trimethoprim 800-160 MG tablet Commonly known as: BACTRIM DS   Wegovy 0.25 MG/0.5ML Soaj Generic drug: Semaglutide-Weight Management       TAKE these medications    acetaminophen 500 MG tablet Commonly known as: TYLENOL Take 2 tablets (1,000 mg total) by mouth 3 (three) times daily.   amLODipine 5 MG tablet Commonly known as: NORVASC Take 5 mg by mouth daily.   cefadroxil 500 MG capsule Commonly known as: DURICEF Take 1 capsule (500 mg total) by mouth 2 (two) times daily for 5 days.   Cholecalciferol 25 MCG (1000 UT) Tbdp Take 1 tablet by mouth daily.   clopidogrel 75 MG tablet Commonly known as: PLAVIX TAKE ONE TABLET BY MOUTH DAILY   dapagliflozin propanediol 10 MG Tabs tablet Commonly known as: FARXIGA Take 10 mg by mouth daily.   divalproex 500 MG 24 hr tablet Commonly known as: DEPAKOTE ER Take 2 tablets (1,000 mg total) by mouth at bedtime.   DULoxetine 60 MG capsule Commonly known as: CYMBALTA Take 2 capsules (120 mg total) by mouth daily.   febuxostat 40 MG tablet Commonly known as: ULORIC Take 40 mg by  mouth daily.   furosemide 20 MG tablet Commonly known as: LASIX Take 20 mg by mouth every other day.   gabapentin 400 MG capsule Commonly known as: NEURONTIN Take 400 mg by mouth 2 (two) times daily.   hydrALAZINE 25 MG tablet Commonly known as: APRESOLINE Take 50 mg by mouth 3 (three) times daily.   HYDROcodone-acetaminophen 5-325 MG tablet Commonly known as: NORCO/VICODIN Take 1 tablet by mouth every 6 (six) hours as needed for moderate pain.   Kerendia 10 MG Tabs Generic drug: Finerenone Take 1 tablet by mouth daily.   L-Carnitine 500 MG Tabs Take 500 mg by mouth 2 (two) times daily.   lamoTRIgine 200 MG tablet Commonly known as: LAMICTAL TAKE ONE TABLET BY MOUTH IN THE MORNING   magnesium oxide 400 (240 Mg) MG tablet Commonly known as: MAG-OX Take 400 mg by mouth daily.   multivitamin-lutein Caps capsule Take 1 capsule by mouth daily.   pramipexole 0.5 MG tablet Commonly known as: MIRAPEX Take 1 tablet (0.5 mg total) by mouth 2 (two) times daily.   pregabalin 50 MG capsule Commonly known as: LYRICA Take 50 mg by mouth 2 (two) times daily.   rosuvastatin 40 MG tablet Commonly known as: CRESTOR Take 1 tablet (40 mg total) by mouth daily.   saccharomyces boulardii 250 MG capsule Commonly known as: FLORASTOR Take 1 capsule (250 mg total) by mouth 2 (two) times daily for 5 days.   tadalafil 20 MG tablet Commonly known as: CIALIS Take 20 mg by mouth daily as needed.   tamsulosin 0.4 MG Caps capsule Commonly known as: FLOMAX Take 0.4 mg by mouth daily.   Veltassa 8.4 g packet Generic drug: patiromer Take 8.4 g by mouth See admin instructions. Take one packet by mouth three times a week. Days vary.         The results of significant diagnostics from this hospitalization (including imaging, microbiology, ancillary and laboratory) are listed below for reference.    Procedures and Diagnostic Studies:   CT Chest Wo Contrast  Result Date:  08/09/2023 CLINICAL DATA:  Fever  and cough, pneumonia suspected. Equivocal x-ray due to low lung volumes. EXAM: CT CHEST WITHOUT CONTRAST TECHNIQUE: Multidetector CT imaging of the chest was performed following the standard protocol without IV contrast. RADIATION DOSE REDUCTION: This exam was performed according to the departmental dose-optimization program which includes automated exposure control, adjustment of the mA and/or kV according to patient size and/or use of iterative reconstruction technique. COMPARISON:  AP Lat chest today, portable chest 07/01/2022, abdomen and pelvis CT no contrast 05/18/2016. No prior chest CT. FINDINGS: Cardiovascular: The cardiac size is normal. There are patchy three-vessel coronary calcifications. There is no pericardial effusion. There is scattered aortic calcific plaque with mild tortuosity, without aortic aneurysm. The great vessels branch normally without visible significant plaques. The pulmonary veins are nondistended. There is a prominent pulmonary trunk measuring 3.3 cm indicating arterial hypertension with slightly prominent main pulmonary arteries. Mediastinum/Nodes: No enlarged mediastinal or axillary lymph nodes. Thyroid gland, trachea, and esophagus demonstrate no significant findings. There is a small hiatal hernia. The main bronchi are clear. Lungs/Pleura: Lung bases again demonstrate scattered linear scarring or atelectasis. There is no consolidation, effusion or pneumothorax. No pulmonary nodules are seen. Upper Abdomen: Mildly prominent spleen measuring 14 cm coronal. The liver is slightly steatotic. There is abdominal aortic atherosclerosis. No acute upper abdominal findings. Musculoskeletal: There is extensive thoracic spine bridging enthesopathy. Mild thoracic kyphosis. No acute or other significant osseous findings or suspicious regional bone lesion. Unremarkable chest wall. IMPRESSION: 1. No acute noncontrast chest CT findings. 2. Aortic and coronary artery  atherosclerosis. 3. Small hiatal hernia. 4. Prominent pulmonary trunk and main pulmonary arteries. 5. Mild hepatic steatosis and mild splenomegaly. Aortic Atherosclerosis (ICD10-I70.0). Electronically Signed   By: Almira Bar M.D.   On: 08/09/2023 03:35   DG Chest 2 View  Result Date: 08/09/2023 CLINICAL DATA:  Lower leg cellulitis, fever EXAM: CHEST - 2 VIEW COMPARISON:  07/01/2022 FINDINGS: Low lung volumes. Suspected mild bibasilar atelectasis. No frank interstitial edema. No pleural effusion or pneumothorax. Heart is top-normal in size. Degenerative changes of the visualized thoracolumbar spine. IMPRESSION: Low lung volumes. Suspected mild bibasilar atelectasis. Electronically Signed   By: Charline Bills M.D.   On: 08/09/2023 01:04     Labs:   Basic Metabolic Panel: Recent Labs  Lab 08/09/23 0003 08/09/23 1020 08/10/23 0353 08/11/23 0336  NA 133* 133* 136 139  K 4.7 4.9 4.0 4.6  CL 98 101 105 108  CO2 23 22 22 22   GLUCOSE 142* 124* 106* 100*  BUN 34* 28* 34* 29*  CREATININE 2.53* 2.28* 2.15* 1.86*  CALCIUM 9.3 8.7* 8.3* 8.5*   GFR Estimated Creatinine Clearance: 49.5 mL/min (A) (by C-G formula based on SCr of 1.86 mg/dL (H)). Liver Function Tests: Recent Labs  Lab 08/09/23 0003 08/09/23 1020  AST 29 32  ALT 23 19  ALKPHOS 75 61  BILITOT 0.8 1.1  PROT 7.8 6.8  ALBUMIN 4.5 3.5   No results for input(s): "LIPASE", "AMYLASE" in the last 168 hours. No results for input(s): "AMMONIA" in the last 168 hours. Coagulation profile Recent Labs  Lab 08/09/23 0003  INR 1.0    CBC: Recent Labs  Lab 08/09/23 0003 08/09/23 1020 08/10/23 0353 08/11/23 0336  WBC 11.4* 13.0* 11.5* 8.3  NEUTROABS 9.8* 11.1* 8.6* 5.4  HGB 13.8 12.1* 10.8* 11.7*  HCT 43.1 37.3* 34.3* 37.2*  MCV 94.9 95.9 98.3 98.4  PLT 158 158 129* 128*   Cardiac Enzymes: Recent Labs  Lab 08/09/23 1020  CKTOTAL 257  BNP: Invalid input(s): "POCBNP" CBG: Recent Labs  Lab 08/10/23 0733  08/10/23 1140 08/10/23 1651 08/10/23 2108 08/11/23 0758  GLUCAP 129* 154* 96 108* 75   D-Dimer No results for input(s): "DDIMER" in the last 72 hours. Hgb A1c No results for input(s): "HGBA1C" in the last 72 hours. Lipid Profile No results for input(s): "CHOL", "HDL", "LDLCALC", "TRIG", "CHOLHDL", "LDLDIRECT" in the last 72 hours. Thyroid function studies No results for input(s): "TSH", "T4TOTAL", "T3FREE", "THYROIDAB" in the last 72 hours.  Invalid input(s): "FREET3" Anemia work up No results for input(s): "VITAMINB12", "FOLATE", "FERRITIN", "TIBC", "IRON", "RETICCTPCT" in the last 72 hours. Microbiology Recent Results (from the past 240 hour(s))  Resp panel by RT-PCR (RSV, Flu A&B, Covid) Anterior Nasal Swab     Status: None   Collection Time: 08/09/23 12:04 AM   Specimen: Anterior Nasal Swab  Result Value Ref Range Status   SARS Coronavirus 2 by RT PCR NEGATIVE NEGATIVE Final    Comment: (NOTE) SARS-CoV-2 target nucleic acids are NOT DETECTED.  The SARS-CoV-2 RNA is generally detectable in upper respiratory specimens during the acute phase of infection. The lowest concentration of SARS-CoV-2 viral copies this assay can detect is 138 copies/mL. A negative result does not preclude SARS-Cov-2 infection and should not be used as the sole basis for treatment or other patient management decisions. A negative result may occur with  improper specimen collection/handling, submission of specimen other than nasopharyngeal swab, presence of viral mutation(s) within the areas targeted by this assay, and inadequate number of viral copies(<138 copies/mL). A negative result must be combined with clinical observations, patient history, and epidemiological information. The expected result is Negative.  Fact Sheet for Patients:  BloggerCourse.com  Fact Sheet for Healthcare Providers:  SeriousBroker.it  This test is no t yet approved or  cleared by the [place] FDA and  has been authorized for detection and/or diagnosis of SARS-CoV-2 by FDA under an Emergency Use Authorization (EUA). This EUA will remain  in effect (meaning this test can be used) for the duration of the COVID-19 declaration under Section 564(b)(1) of the Act, 21 U.S.C.section 360bbb-3(b)(1), unless the authorization is terminated  or revoked sooner.       Influenza A by PCR NEGATIVE NEGATIVE Final   Influenza B by PCR NEGATIVE NEGATIVE Final    Comment: (NOTE) The Xpert Xpress SARS-CoV-2/FLU/RSV plus assay is intended as an aid in the diagnosis of influenza from Nasopharyngeal swab specimens and should not be used as a sole basis for treatment. Nasal washings and aspirates are unacceptable for Xpert Xpress SARS-CoV-2/FLU/RSV testing.  Fact Sheet for Patients: BloggerCourse.com  Fact Sheet for Healthcare Providers: SeriousBroker.it  This test is not yet approved or cleared by the [place] FDA and has been authorized for detection and/or diagnosis of SARS-CoV-2 by FDA under an Emergency Use Authorization (EUA). This EUA will remain in effect (meaning this test can be used) for the duration of the COVID-19 declaration under Section 564(b)(1) of the Act, 21 U.S.C. section 360bbb-3(b)(1), unless the authorization is terminated or revoked.     Resp Syncytial Virus by PCR NEGATIVE NEGATIVE Final    Comment: (NOTE) Fact Sheet for Patients: BloggerCourse.com  Fact Sheet for Healthcare Providers: SeriousBroker.it  This test is not yet approved or cleared by the [place] FDA and has been authorized for detection and/or diagnosis of SARS-CoV-2 by FDA under an Emergency Use Authorization (EUA). This EUA will remain in effect (meaning this test can be used) for the duration of the  COVID-19 declaration under Section 564(b)(1) of the Act, 21  U.S.C. section 360bbb-3(b)(1), unless the authorization is terminated or revoked.  Performed at [facility], [address] [place], [place] [address]   Culture, blood (Routine x 2)     Status: None (Preliminary result)   Collection Time: 08/09/23 12:13 AM   Specimen: BLOOD  Result Value Ref Range Status   Specimen Description   Final    BLOOD SITE NOT SPECIFIED Performed at [facility], [address], [place], [place] [address]    Special Requests   Final    BOTTLES DRAWN AEROBIC AND ANAEROBIC Blood Culture adequate volume Performed at [facility], [address], [place], [place] [address]    Culture   Final    NO GROWTH 2 DAYS Performed at [facility], [address], [place], [place] [address]    Report Status PENDING  Incomplete  Culture, blood (Routine x 2)     Status: None (Preliminary result)   Collection Time: 08/09/23 12:30 AM   Specimen: BLOOD  Result Value Ref Range Status   Specimen Description   Final    BLOOD RIGHT ANTECUBITAL Performed at [facility], [address] [place], [place] [address]    Special Requests   Final    BOTTLES DRAWN AEROBIC AND ANAEROBIC Blood Culture adequate volume Performed at [facility], [address], [place], [place] [address]    Culture   Final    NO GROWTH 2 DAYS Performed at [facility], [address], [place], [place] [address]    Report Status PENDING  Incomplete    Time coordinating discharge: 45 minutes  Signed: Marcellous Snarski  [facility] 08/11/2023, 11:15 AM

## 2023-08-13 ENCOUNTER — Telehealth: Payer: Self-pay | Admitting: Pharmacist

## 2023-08-13 NOTE — Telephone Encounter (Signed)
Call to titrate [facility] dose. N/A LVM.

## 2023-08-14 LAB — CULTURE, BLOOD (ROUTINE X 2)
Culture: NO GROWTH
Culture: NO GROWTH
Special Requests: ADEQUATE
Special Requests: ADEQUATE

## 2023-08-16 ENCOUNTER — Ambulatory Visit
Admission: RE | Admit: 2023-08-16 | Discharge: 2023-08-16 | Disposition: A | Discharge status: Home / Self Care | Payer: Medicare Other | Source: Ambulatory Visit | Attending: Orthopaedic Surgery | Admitting: Orthopaedic Surgery

## 2023-08-16 DIAGNOSIS — M1711 Unilateral primary osteoarthritis, right knee: Secondary | ICD-10-CM

## 2023-08-16 DIAGNOSIS — M1712 Unilateral primary osteoarthritis, left knee: Secondary | ICD-10-CM

## 2023-08-16 NOTE — Telephone Encounter (Signed)
Spoke to patient, he is in coverage gap so Edwin [name] is cost prohibitive. Patient decided not to start therapy.

## 2023-08-20 DIAGNOSIS — L03116 Cellulitis of left lower limb: Secondary | ICD-10-CM | POA: Diagnosis not present

## 2023-08-20 DIAGNOSIS — E291 Testicular hypofunction: Secondary | ICD-10-CM | POA: Diagnosis not present

## 2023-08-20 DIAGNOSIS — I10 Essential (primary) hypertension: Secondary | ICD-10-CM | POA: Diagnosis not present

## 2023-08-20 DIAGNOSIS — N183 Chronic kidney disease, stage 3 unspecified: Secondary | ICD-10-CM | POA: Diagnosis not present

## 2023-08-20 DIAGNOSIS — E1122 Type 2 diabetes mellitus with diabetic chronic kidney disease: Secondary | ICD-10-CM | POA: Diagnosis not present

## 2023-08-22 ENCOUNTER — Telehealth: Payer: Self-pay | Admitting: Psychiatry

## 2023-08-22 DIAGNOSIS — M1009 Idiopathic gout, multiple sites: Secondary | ICD-10-CM | POA: Diagnosis not present

## 2023-08-22 DIAGNOSIS — M1991 Primary osteoarthritis, unspecified site: Secondary | ICD-10-CM | POA: Diagnosis not present

## 2023-08-22 DIAGNOSIS — M25562 Pain in left knee: Secondary | ICD-10-CM | POA: Diagnosis not present

## 2023-08-22 DIAGNOSIS — Z79899 Other long term (current) drug therapy: Secondary | ICD-10-CM | POA: Diagnosis not present

## 2023-08-22 NOTE — Telephone Encounter (Signed)
Pt called and said  that he is looking for an attorney that understands people with mental illness and can be compassionate. He was wondering if dr. Jennelle Human could recommend one to him. Please call him at [phone]

## 2023-08-22 NOTE — Telephone Encounter (Signed)
What kind of attorney is patient looking for?

## 2023-08-23 NOTE — Telephone Encounter (Signed)
LVM to [facility] 

## 2023-08-26 NOTE — Telephone Encounter (Signed)
Called patient again and he reported he had the good fortune to find an attorney on Friday. He did not need anything further from [place] at this time.

## 2023-09-01 ENCOUNTER — Emergency Department (HOSPITAL_COMMUNITY)
Admission: EM | Admit: 2023-09-01 | Discharge: 2023-09-01 | Disposition: A | Discharge status: Home / Self Care | Payer: Medicare Other | Attending: Emergency Medicine | Admitting: Emergency Medicine

## 2023-09-01 ENCOUNTER — Emergency Department (HOSPITAL_COMMUNITY): Payer: Medicare Other

## 2023-09-01 ENCOUNTER — Other Ambulatory Visit: Payer: Self-pay

## 2023-09-01 DIAGNOSIS — M545 Low back pain, unspecified: Secondary | ICD-10-CM | POA: Diagnosis not present

## 2023-09-01 DIAGNOSIS — M47816 Spondylosis without myelopathy or radiculopathy, lumbar region: Secondary | ICD-10-CM | POA: Diagnosis not present

## 2023-09-01 DIAGNOSIS — I10 Essential (primary) hypertension: Secondary | ICD-10-CM

## 2023-09-01 DIAGNOSIS — I129 Hypertensive chronic kidney disease with stage 1 through stage 4 chronic kidney disease, or unspecified chronic kidney disease: Secondary | ICD-10-CM | POA: Diagnosis not present

## 2023-09-01 DIAGNOSIS — M5459 Other low back pain: Secondary | ICD-10-CM | POA: Diagnosis not present

## 2023-09-01 DIAGNOSIS — M25552 Pain in left hip: Secondary | ICD-10-CM | POA: Diagnosis not present

## 2023-09-01 DIAGNOSIS — Z79899 Other long term (current) drug therapy: Secondary | ICD-10-CM | POA: Insufficient documentation

## 2023-09-01 DIAGNOSIS — E1122 Type 2 diabetes mellitus with diabetic chronic kidney disease: Secondary | ICD-10-CM | POA: Insufficient documentation

## 2023-09-01 DIAGNOSIS — E1140 Type 2 diabetes mellitus with diabetic neuropathy, unspecified: Secondary | ICD-10-CM | POA: Diagnosis not present

## 2023-09-01 DIAGNOSIS — Z7902 Long term (current) use of antithrombotics/antiplatelets: Secondary | ICD-10-CM | POA: Insufficient documentation

## 2023-09-01 DIAGNOSIS — M8580 Other specified disorders of bone density and structure, unspecified site: Secondary | ICD-10-CM | POA: Diagnosis not present

## 2023-09-01 DIAGNOSIS — N189 Chronic kidney disease, unspecified: Secondary | ICD-10-CM | POA: Insufficient documentation

## 2023-09-01 DIAGNOSIS — Z7984 Long term (current) use of oral hypoglycemic drugs: Secondary | ICD-10-CM | POA: Insufficient documentation

## 2023-09-01 DIAGNOSIS — M160 Bilateral primary osteoarthritis of hip: Secondary | ICD-10-CM | POA: Diagnosis not present

## 2023-09-01 MED ORDER — TIZANIDINE HCL 4 MG PO TABS: 4.0000 mg | ORAL_TABLET | Freq: Four times a day (QID) | ORAL | 0 refills | Status: DC | PRN

## 2023-09-01 MED ORDER — HYDROCODONE-ACETAMINOPHEN 5-325 MG PO TABS: 1.0000 | ORAL_TABLET | Freq: Four times a day (QID) | ORAL | 0 refills | Status: DC | PRN

## 2023-09-01 MED ORDER — PREDNISONE 20 MG PO TABS: ORAL_TABLET | ORAL | 0 refills | Status: DC

## 2023-09-01 MED ORDER — PREDNISONE 20 MG PO TABS
40.0000 mg | ORAL_TABLET | Freq: Once | ORAL | Status: AC
  Administered 2023-09-01: 40 mg via ORAL
  Filled 2023-09-01: qty 2

## 2023-09-01 MED ORDER — HYDROCODONE-ACETAMINOPHEN 5-325 MG PO TABS
1.0000 | ORAL_TABLET | Freq: Once | ORAL | Status: AC
  Administered 2023-09-01: 1 via ORAL
  Filled 2023-09-01: qty 1

## 2023-09-01 MED ORDER — CYCLOBENZAPRINE HCL 10 MG PO TABS
10.0000 mg | ORAL_TABLET | Freq: Once | ORAL | Status: AC
  Administered 2023-09-01: 10 mg via ORAL
  Filled 2023-09-01: qty 1

## 2023-09-01 NOTE — ED Triage Notes (Signed)
Pt c/o sharp left lower back pain x 4 days. Pain non-radiating and worsening with movement. Endorses light yard work the previous day. Has rest and taken tylenol without relief.

## 2023-09-01 NOTE — Discharge Instructions (Addendum)
Apply ice for 30 minutes at a time, 4 times a day.  Take acetaminophen as needed for pain, reserve hydrocodone-acetaminophen for pain not relieved by acetaminophen alone.  Talk with your primary care provider about referral for physical therapy.

## 2023-09-01 NOTE — ED Provider Notes (Signed)
[place] EMERGENCY DEPARTMENT AT [facility] Provider Note   CSN: [id number] Arrival date & time: 09/01/23  2146     History  Chief Complaint  Patient presents with   Back Pain    Edwin [name] is a 67 y.o. male.  The history is provided by the patient.  Back Pain With history of hypertension, diabetes with peripheral neuropathy, chronic kidney disease, bipolar disorder and comes in complaining of left lower lumbar pain for the last 4 days.  He had done some mild activity prior to that and states that he has had pain in that area before.  He has been taking acetaminophen without relief.  Pain does not radiate.  He denies any weakness or numbness.  He denies any bowel or bladder dysfunction.   Home Medications Prior to Admission medications   Medication Sig Start Date End Date Taking? Authorizing Provider  acetaminophen (TYLENOL) 500 MG tablet Take 2 tablets (1,000 mg total) by mouth 3 (three) times daily. 08/11/23 11/09/23  Lorin Glass, MD  amLODipine (NORVASC) 5 MG tablet Take 5 mg by mouth daily. 05/28/23   [provider]  Cholecalciferol 1000 units TBDP Take 1 tablet by mouth daily.     [provider]  clopidogrel (PLAVIX) 75 MG tablet TAKE ONE TABLET BY MOUTH DAILY 04/23/23   Butch Penny, NP  dapagliflozin propanediol (FARXIGA) 10 MG TABS tablet Take 10 mg by mouth daily.    [provider]  divalproex (DEPAKOTE ER) 500 MG 24 hr tablet Take 2 tablets (1,000 mg total) by mouth at bedtime. 05/27/23   Cottle, Steva Ready., MD  DULoxetine (CYMBALTA) 60 MG capsule Take 2 capsules (120 mg total) by mouth daily. 05/27/23   Cottle, Steva Ready., MD  febuxostat (ULORIC) 40 MG tablet Take 40 mg by mouth daily.    [provider]  furosemide (LASIX) 20 MG tablet Take 20 mg by mouth every other day. 07/21/19   [provider]  gabapentin (NEURONTIN) 400 MG capsule Take 400 mg by mouth 2 (two) times daily.    [provider]   hydrALAZINE (APRESOLINE) 25 MG tablet Take 50 mg by mouth 3 (three) times daily. 07/11/23   [provider]  HYDROcodone-acetaminophen (NORCO/VICODIN) 5-325 MG tablet Take 1 tablet by mouth every 6 (six) hours as needed for moderate pain. 08/08/23   Persons, [place], PA  KERENDIA 10 MG TABS Take 1 tablet by mouth daily. 03/18/23   [provider]  lamoTRIgine (LAMICTAL) 200 MG tablet TAKE ONE TABLET BY MOUTH IN THE MORNING 06/30/23   Cottle, Steva Ready., MD  LevOCARNitine (L-CARNITINE) 500 MG TABS Take 500 mg by mouth 2 (two) times daily.    [provider]  magnesium oxide (MAG-OX) 400 (240 Mg) MG tablet Take 400 mg by mouth daily.    [provider]  multivitamin-lutein (OCUVITE-LUTEIN) CAPS capsule Take 1 capsule by mouth daily.    [provider]  pramipexole (MIRAPEX) 0.5 MG tablet Take 1 tablet (0.5 mg total) by mouth 2 (two) times daily. 05/27/23   Cottle, Steva Ready., MD  pregabalin (LYRICA) 50 MG capsule Take 50 mg by mouth 2 (two) times daily.    [provider]  rosuvastatin (CRESTOR) 40 MG tablet Take 1 tablet (40 mg total) by mouth daily. 06/28/23 09/26/23  Jake Bathe, MD  tadalafil (CIALIS) 20 MG tablet Take 20 mg by mouth daily as needed. Patient not taking: Reported on 08/09/2023 06/21/23  [provider]  tamsulosin (FLOMAX) 0.4 MG CAPS capsule Take 0.4 mg by mouth daily. 06/04/22   [provider]  VELTASSA 8.4 g packet Take 8.4 g by mouth See admin instructions. Take one packet by mouth three times a week. Days vary.    [provider]      Allergies    Allopurinol, Penicillins, Finerenone, Penicillin g, Aspirin, and Ciprofloxacin    Review of Systems   Review of Systems  Musculoskeletal:  Positive for back pain.  All other systems reviewed and are negative.   Physical Exam Updated Vital Signs BP (!) 176/96 (BP Location: Left Arm)   Pulse 66   Temp (!) 97.5 F (36.4 C) (Oral)   Resp 18   Ht  5\' 10"  (1.778 m)   Wt 111.1 kg   SpO2 100%   BMI 35.15 kg/m  Physical Exam Vitals and nursing note reviewed.   67 year old male, resting comfortably and in no acute distress. Vital signs are significant for elevated blood pressure. Oxygen saturation is 100%, which is normal. Head is normocephalic and atraumatic. PERRLA, EOMI.  Back is mildly tender in the left lower lumbar area with mild to moderate left paralumbar spasm.  Straight leg rais is positive at 30 degrees on the left, and crossed straight leg raise is positive on the right at 45 degrees.  There is no midline tenderness. Lungs are clear without rales, wheezes, or rhonchi. Chest is nontender. Heart has regular rate and rhythm without murmur. Abdomen is soft, flat, nontender. Extremities: Asymmetric edema is present with the left leg more swollen than the right (recent episode of cellulitis involving the left leg, size discrepancy has been noticed since discharge from the hospital recently). Skin is warm and dry without rash. Neurologic: Mental status is normal, cranial nerves are intact, strength is 5/5 in all 4 extremities, there are no sensory deficits.  ED Results / Procedures / Treatments    Radiology DG Hip Unilat With Pelvis 2-3 Views Left  Result Date: 09/01/2023 CLINICAL DATA:  Left lower back and hip pain.  No known injury. EXAM: DG HIP (WITH OR WITHOUT PELVIS) 2-3V LEFT COMPARISON:  None Available. FINDINGS: No acute fracture or dislocation. The bones are mildly osteopenic. Mild bilateral hip arthritic changes. The soft tissues are unremarkable. IMPRESSION: 1. No acute fracture or dislocation. 2. Mild bilateral hip arthritic changes. Electronically Signed   By: Elgie Collard M.D.   On: 09/01/2023 23:01   DG Lumbar Spine Complete  Result Date: 09/01/2023 CLINICAL DATA:  Low back pain EXAM: LUMBAR SPINE - COMPLETE 4+ VIEW COMPARISON:  Lumbar spine x-ray 05/22/2022 FINDINGS: There is no evidence of lumbar spine  fracture. Alignment is normal. Intervertebral disc spaces are maintained. Mild endplate osteophytes are seen at L4-L5 and L5-S1. There is a large amount of stool in the colon. IMPRESSION: 1. Mild degenerative changes in the lower lumbar spine. 2. Large amount of stool in the colon. Electronically Signed   By: Darliss Cheney M.D.   On: 09/01/2023 23:00    Procedures Procedures    Medications Ordered in ED Medications  predniSONE (DELTASONE) tablet 40 mg (40 mg Oral Given 09/01/23 2329)  cyclobenzaprine (FLEXERIL) tablet 10 mg (10 mg Oral Given 09/01/23 2329)  HYDROcodone-acetaminophen (NORCO/VICODIN) 5-325 MG per tablet 1 tablet (1 tablet Oral Given 09/01/23 2329)    ED Course/ Medical Decision Making/ A&P  Medical Decision Making  Left lumbar pain which appears to be musculoskeletal.  No red flags to suggest more serious pathology such as dissecting abdominal aortic aneurysm, urolithiasis, pyelonephritis.  I reviewed his past records and do note hospitalization 08/08/2023-08/11/2023 for cellulitis of the left leg.  CT of abdomen and pelvis on 02/01/2017 showed mild aortic atherosclerosis but no aneurysm.  It is very unlikely significant aneurysm would have developed in the interim.  He cannot receive NSAIDs because of chronic kidney disease.  I am discharging him with prescription for tizanidine and hydrocodone-acetaminophen for pain, also a 4-day course of prednisone.  I am referring him back to his primary care provider.  I recommended application of ice, he may benefit from physical therapy.  Final Clinical Impression(s) / ED Diagnoses Final diagnoses:  Acute left-sided low back pain without sciatica  Elevated blood pressure reading with diagnosis of hypertension    Rx / DC Orders ED Discharge Orders          Ordered    tiZANidine (ZANAFLEX) 4 MG tablet  Every 6 hours PRN        09/01/23 2327    predniSONE (DELTASONE) 20 MG tablet        09/01/23 2327     HYDROcodone-acetaminophen (NORCO/VICODIN) 5-325 MG tablet  Every 6 hours PRN        09/01/23 2327              Dione Booze, MD 09/01/23 2333

## 2023-09-03 ENCOUNTER — Encounter: Payer: Self-pay | Admitting: Adult Health

## 2023-09-03 ENCOUNTER — Ambulatory Visit: Payer: Medicare Other | Admitting: Adult Health

## 2023-09-03 VITALS — BP 124/80 | HR 66 | Ht 70.0 in | Wt 248.0 lb

## 2023-09-03 DIAGNOSIS — Z8673 Personal history of transient ischemic attack (TIA), and cerebral infarction without residual deficits: Secondary | ICD-10-CM

## 2023-09-03 DIAGNOSIS — R202 Paresthesia of skin: Secondary | ICD-10-CM

## 2023-09-03 NOTE — Progress Notes (Signed)
PATIENT: Edwin [name] DOB: 1956/11/07  REASON FOR VISIT: follow up HISTORY FROM: patient PRIMARY NEUROLOGIST: Dr. Pearlean Brownie  Chief Complaint  Patient presents with   Follow-up    Rm 19 with spouse Sweet Pt is well, reports he still has some tingling in L hand. Stable from stroke standpoint. No new concerns.      HISTORY OF PRESENT ILLNESS: Today 09/03/23  Edwin [name] is a 67 y.o. male who has been followed in this office for history of stroke with residual paresthesias in the left upper extremity.Returns today for follow-up. Patient has remained on Lyrica and gabapentin. Through a mychart visit- I instructed him how to wean off Gabapentin. However, he states that he thought he was suppose to stay on it. No longer complaining of drowsiness. Still having burning and tingling in the LUE.   HISTORY 11/29/22:  Edwin [name] is a 67 year old male with a history of stroke with residual paresthesias in the left upper extremity.  He returns today to discuss his medication.  Reports that he is still having tingling in that hand.  Has gotten some relief with gabapentin.  Has tried doing gabapentin 400 mg 3 times a day but this caused too much drowsiness.  He is now taking 400 twice a day.  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  HISTORY 10/02/22: Edwin [name] is a 67 year old male with a history of stroke with residual paresthesias in the left upper extremity.  He returns today for follow-up.  He is currently on gabapentin taking 400 mg twice a day. Tingling down the arm comes and goes. Reports tingling in the back of the left leg as well. Doesn't notice when he is busy but bothersome at other times.  Symptoms were not there prior to his stroke but has remained since history.  He returns today for evaluation     06/20/22: Edwin [name] is a 67 year old male with a history of stroke with residual paresthesia.  He returns today for follow-up. Patient Edwin [name]  to the ED on July 15 with left arm and leg numbness.  The patient had residual numbness in the left arm and leg from his prior stroke but felt that it is gotten worse.  Work-up in the ED was relatively unremarkable.  He was started on Topamax for paresthesias but feels that this made it worse.  He returns today to discuss another medication to try. He describes it has pins and needles sensation in the left arm and leg- not as much numbness. He teaches piano.    Reports that he does feel that is balance is off. Tends to veer to the left when ambulating. Feels like he is going to fall but fortunately has not had any falls.  Reports that when he was completing physical therapy he did find it beneficial.  Would like to have another round of physical therapy.   03/22/22: Edwin [name] is a 67 year old male who presented to the ED on March 19 with left-sided sensory deficit and difficulty with ambulation.  He did not receive TNKase due to being out of the window.   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 CT head - 02/04/2022 - No acute intracranial process.  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  Continue Plavix on discharge   Patient reports that he is doing well. Continues to have some tingling on the left side of the body.  Feels it down the arm and leg. Did not complete any therapy. Reports that he had blood work through PCP and reports that LDL is better and is no longer Crestor. States that it caused fatigue and muscle pain.    Reports that he is  monitoring diet and sees a nutritionist   OSA: was using it but then went on a trip and the machine adaptor was broken and was not able to get a replacement CPAP. Was seeing Dr. Earl Gala.  States that he struggled using the CPAP.  Reports that he would wake up every hour.  Reports that he tried different mask with no benefit.  Reports that he is interested in inspire but Dr. Earl Gala does not do this.  He has not seen Dr. Earl Gala in the last year.  He feels that his last sleep study was in 2021 he is requesting to see our sleep physicians here to see if he is a candidate for possible inspire device   REVIEW OF SYSTEMS: Out of a complete 14 system review of symptoms, the patient complains only of the following symptoms, and all other reviewed systems are negative.  ALLERGIES: Allergies  Allergen Reactions   Allopurinol Other (See Comments)    Made pt emotionally unstable  Other reaction(s): emotionally labile   Penicillins Hives and Other (See Comments)   Finerenone Other (See Comments)    hyperkalemia Other reaction(s): Increases  potassium levels   Penicillin G     Other reaction(s): hives   Aspirin Hives   Ciprofloxacin Rash    Other reaction(s): rash    HOME MEDICATIONS: Outpatient Medications Prior to Visit  Medication Sig Dispense Refill   acetaminophen (TYLENOL) 500 MG tablet Take 2 tablets (1,000 mg total) by mouth 3 (three) times daily. 180 tablet 2   amLODipine (NORVASC) 5 MG tablet Take 5 mg by mouth daily.     Cholecalciferol 1000 units TBDP Take 1 tablet by mouth daily.      clopidogrel (PLAVIX) 75 MG tablet TAKE ONE TABLET BY MOUTH DAILY 90 tablet 1   dapagliflozin propanediol (FARXIGA) 10 MG TABS tablet Take 10 mg by mouth daily.     divalproex (DEPAKOTE ER) 500 MG 24 hr tablet Take 2 tablets (1,000 mg total) by mouth at bedtime. 60 tablet 5   DULoxetine (CYMBALTA) 60 MG capsule Take 2 capsules (120 mg total) by mouth daily. 60 capsule 5   febuxostat (ULORIC) 40 MG tablet  Take 40 mg by mouth daily.     furosemide (LASIX) 20 MG tablet Take 20 mg by mouth every other day.     gabapentin (NEURONTIN) 400 MG capsule Take 400 mg by mouth 2 (two) times daily.     HYDROcodone-acetaminophen (NORCO/VICODIN) 5-325 MG tablet Take 1 tablet by mouth every 6 (six) hours as needed for moderate pain. 20 tablet 0   KERENDIA 10 MG TABS Take 1 tablet by mouth daily.     lamoTRIgine (LAMICTAL) 200 MG tablet TAKE ONE TABLET BY MOUTH IN THE MORNING 90 tablet 0   LevOCARNitine (L-CARNITINE) 500 MG  TABS Take 500 mg by mouth 2 (two) times daily.     magnesium oxide (MAG-OX) 400 (240 Mg) MG tablet Take 400 mg by mouth daily.     multivitamin-lutein (OCUVITE-LUTEIN) CAPS capsule Take 1 capsule by mouth daily.     pramipexole (MIRAPEX) 0.5 MG tablet Take 1 tablet (0.5 mg total) by mouth 2 (two) times daily. 60 tablet 5   predniSONE (DELTASONE) 20 MG tablet 2 tabs po daily x 4 days 8 tablet 0   pregabalin (LYRICA) 50 MG capsule Take 50 mg by mouth 2 (two) times daily.     rosuvastatin (CRESTOR) 40 MG tablet Take 1 tablet (40 mg total) by mouth daily. 90 tablet 3   tamsulosin (FLOMAX) 0.4 MG CAPS capsule Take 0.4 mg by mouth daily.     VELTASSA 8.4 g packet Take 8.4 g by mouth See admin instructions. Take one packet by mouth three times a week. Days vary.     hydrALAZINE (APRESOLINE) 25 MG tablet Take 50 mg by mouth 3 (three) times daily. (Patient not taking: Reported on 09/03/2023)     tadalafil (CIALIS) 20 MG tablet Take 20 mg by mouth daily as needed. (Patient not taking: Reported on 09/03/2023)     tiZANidine (ZANAFLEX) 4 MG tablet Take 1 tablet (4 mg total) by mouth every 6 (six) hours as needed for muscle spasms. (Patient not taking: Reported on 09/03/2023) 40 tablet 0   No facility-administered medications prior to visit.    PAST MEDICAL HISTORY: Past Medical History:  Diagnosis Date   Allergic rhinitis    Bipolar disorder (HCC)    Bradycardia 2012    due to lithium   CKD  (chronic kidney disease) stage 2, GFR 60-89 ml/min    DM2 (diabetes mellitus, type 2) (HCC)    Gout    Hypertension    Mild renal insufficiency    , with creatinine of 1.3 in 2010, was side effect of med   Obesity    ,moderate   Prostatitis    , Episodic   Stroke (HCC)    Suicide attempt (HCC) 09/20/2014    PAST SURGICAL HISTORY: Past Surgical History:  Procedure Laterality Date   APPENDECTOMY     CATARACT EXTRACTION Right    COLONOSCOPY WITH PROPOFOL N/A 06/14/2014   Procedure: COLONOSCOPY WITH PROPOFOL;  Surgeon: Charolett Bumpers, MD;  Location: [facility];  Service: Endoscopy;  Laterality: N/A;   TONSILLECTOMY     UMBILICAL HERNIA REPAIR      FAMILY HISTORY: Family History  Problem Relation Age of Onset   Other Mother        Viral Meningitis   Mitral valve prolapse Mother    Arrhythmia Mother    Hypothyroidism Mother    Stroke Mother    Hypertension Father    Gout Father    Alzheimer's disease Father     SOCIAL HISTORY: Social History   Socioeconomic History   Marital status: Married    Spouse name: Sweet   Number of children: 0   Years of education: Not on file   Highest education level: Master's degree (e.g., MA, MS, MEng, MEd, MSW, MBA)  Occupational History   Not on file  Tobacco Use   Smoking status: Never    Passive exposure: Never   Smokeless tobacco: Never  Substance and Sexual Activity   Alcohol use: No   Drug use: No   Sexual activity: Not on file  Other Topics Concern   Not on file  Social History  Narrative   Lives alone   Left handed   Caffeine: 2 ice tea a day   Social Determinants of Health   Financial Resource Strain: Low Risk  (02/22/2022)   Overall Financial Resource Strain (CARDIA)    Difficulty of Paying Living Expenses: Not hard at all  Food Insecurity: No Food Insecurity (08/09/2023)   Hunger Vital Sign    Worried About Running Out of Food in the Last Year: Never true    Ran Out of Food in the Last Year: Never true   Transportation Needs: No Transportation Needs (08/09/2023)   PRAPARE - Administrator, Civil Service (Medical): No    Lack of Transportation (Non-Medical): No  Physical Activity: Not on file  Stress: No Stress Concern Present (02/22/2022)   [employer] of Occupational Health - Occupational Stress Questionnaire    Feeling of Stress : Only a little  Social Connections: Unknown (04/03/2022)   Received from [facility]   Social Network    Social Network: Not on file  Intimate Partner Violence: Not At Risk (08/09/2023)   Humiliation, Afraid, Rape, and Kick questionnaire    Fear of Current or Ex-Partner: No    Emotionally Abused: No    Physically Abused: No    Sexually Abused: No      PHYSICAL EXAM  Vitals:   09/03/23 1130  Weight: 248 lb (112.5 kg)  Height: 5\' 10"  (1.778 m)   Body mass index is 35.58 kg/m.  Generalized: Well developed, in no acute distress   Neurological examination  Mentation: Alert oriented to time, place, history taking. Follows all commands speech and language fluent Cranial nerve II-XII: Pupils were equal round reactive to light. Extraocular movements were full, visual field were full on confrontational test. Facial sensation and strength were normal. Uvula tongue midline. Head turning and shoulder shrug  were normal and symmetric. Motor: The motor testing reveals 5 over 5 strength of all 4 extremities. Good symmetric motor tone is noted throughout.  Sensory: Sensory testing is intact to soft touch on all 4 extremities. No evidence of extinction is noted.  Coordination: Cerebellar testing reveals good finger-nose-finger and heel-to-shin bilaterally.  Gait and station: Gait is normal.  Reflexes: Deep tendon reflexes are symmetric and normal bilaterally.   DIAGNOSTIC DATA (LABS, IMAGING, TESTING) - I reviewed patient records, labs, notes, testing and imaging myself where available.  Lab Results  Component Value Date   WBC  8.3 08/11/2023   HGB 11.7 (L) 08/11/2023   HCT 37.2 (L) 08/11/2023   MCV 98.4 08/11/2023   PLT 128 (L) 08/11/2023      Component Value Date/Time   NA 139 08/11/2023 0336   K 4.6 08/11/2023 0336   CL 108 08/11/2023 0336   CO2 22 08/11/2023 0336   GLUCOSE 100 (H) 08/11/2023 0336   BUN 29 (H) 08/11/2023 0336   CREATININE 1.86 (H) 08/11/2023 0336   CALCIUM 8.5 (L) 08/11/2023 0336   PROT 6.8 08/09/2023 1020   ALBUMIN 3.5 08/09/2023 1020   AST 32 08/09/2023 1020   ALT 19 08/09/2023 1020   ALKPHOS 61 08/09/2023 1020   BILITOT 1.1 08/09/2023 1020   GFRNONAA 40 (L) 08/11/2023 0336   GFRAA >60 02/04/2017 0525   Lab Results  Component Value Date   CHOL 160 07/01/2022   HDL 46 07/01/2022   LDLCALC 105 (H) 07/01/2022   TRIG 45 07/01/2022   CHOLHDL 3.5 07/01/2022   Lab Results  Component Value Date   HGBA1C 5.3  05/30/2023   No results [name] for: "VITAMINB12" Lab Results  Component Value Date   TSH 1.405 05/30/2023      ASSESSMENT AND PLAN 68 y.o. year old male  has a past medical history of Allergic rhinitis, Bipolar disorder (HCC), Bradycardia (2012), CKD (chronic kidney disease) stage 2, GFR 60-89 ml/min, DM2 (diabetes mellitus, type 2) (HCC), Gout, Hypertension, Mild renal insufficiency, Obesity, Prostatitis, Stroke (HCC), and Suicide attempt (HCC) (09/20/2014). here with:  H/o of stroke with residual paresthesias  - Advised that we will increase lyrica and wean off gabapentin  - Increase Lyrica to 50 mg in AM, 75 mg in PM and decrease gabapentin to 400 mg in the AM. Do this for 1 week if tolerating well then increase lyrica to 75 mg twice a day and discontinue to gabapentin  - FU in 6-7 months or sooner if needed     Butch Penny, MSN, NP-C 09/03/2023, 11:32 AM [name] [address] [place], [place] [address] [phone]

## 2023-09-03 NOTE — Patient Instructions (Signed)
Your Plan:  Increase Lyrica to 50 mg in AM, 75 mg in PM and decrease gabapentin to only 400 mg in the AM. Do this for 1 week if tolerating well then increase lyrica to 75 mg twice a day and discontinue to gabapentin      Thank you for coming to see [place] at [facility]. I hope we have been able to provide you high quality care today.  You may receive a patient satisfaction survey over the next few weeks. We would appreciate your feedback and comments so that we may continue to improve ourselves and the health of our patients.

## 2023-09-05 ENCOUNTER — Ambulatory Visit: Payer: Medicare Other | Admitting: Adult Health

## 2023-09-09 ENCOUNTER — Encounter: Payer: Self-pay | Admitting: Adult Health

## 2023-09-09 ENCOUNTER — Telehealth: Payer: Self-pay | Admitting: *Deleted

## 2023-09-09 ENCOUNTER — Other Ambulatory Visit: Payer: Self-pay | Admitting: Adult Health

## 2023-09-09 MED ORDER — PREGABALIN 25 MG PO CAPS: ORAL_CAPSULE | ORAL | 0 refills | Status: DC

## 2023-09-09 NOTE — Telephone Encounter (Signed)
I called pt and relayed that if ok with him would send in prescription for  Lyrica 25mg  po x 7 days for him to take in the evening with a 50mg  caps (so he would take 50mg  po am 75mg  po pm, decrease gabapentin to 400mg  once daily.  If he tolerated then would increase the lyrica to 75mg  po bid.  He was ok to try this and let us know.

## 2023-09-12 NOTE — Telephone Encounter (Signed)
Spoke to pt Pt states not picked up rx yet that was prescribed on 09/09/2023 pt states will call back to make [place] aware if he has medication

## 2023-09-14 ENCOUNTER — Other Ambulatory Visit: Payer: Self-pay | Admitting: Psychiatry

## 2023-09-14 DIAGNOSIS — F3181 Bipolar II disorder: Secondary | ICD-10-CM

## 2023-09-16 ENCOUNTER — Encounter: Payer: Self-pay | Admitting: Adult Health

## 2023-09-16 NOTE — Telephone Encounter (Signed)
Pt has returned the call to [place], [place], please call pt back.

## 2023-09-16 NOTE — Telephone Encounter (Signed)
I called [facility].  Pt is taking 25mg  po bid.   Last seen Megan NP ordered increase to 50mg  am and 75mg  pm for once week if tolerated increase to 75 mg po bid.

## 2023-09-17 NOTE — Telephone Encounter (Addendum)
I called pt and LMVM for him to return call.

## 2023-09-17 NOTE — Telephone Encounter (Signed)
I called pt he gets his medication monthly in blister pack (09/09/2023).   He has a bottle of 25mg  caps (#6).  He has been taking 25mg  po bid not 50mg  po bid.

## 2023-09-17 NOTE — Telephone Encounter (Signed)
Yes that's fine and we can continue to increase lyrica

## 2023-09-18 DIAGNOSIS — R051 Acute cough: Secondary | ICD-10-CM | POA: Diagnosis not present

## 2023-09-18 DIAGNOSIS — R0981 Nasal congestion: Secondary | ICD-10-CM | POA: Diagnosis not present

## 2023-09-18 DIAGNOSIS — Z03818 Encounter for observation for suspected exposure to other biological agents ruled out: Secondary | ICD-10-CM | POA: Diagnosis not present

## 2023-09-18 NOTE — Telephone Encounter (Signed)
Pt ok to proceed at slow increase per [employer].  25mg  po am and 50 mg po pm (using 25mg  caps)

## 2023-09-23 ENCOUNTER — Emergency Department (HOSPITAL_COMMUNITY)
Admission: EM | Admit: 2023-09-23 | Discharge: 2023-09-23 | Disposition: A | Discharge status: Home / Self Care | Payer: Medicare Other | Attending: Emergency Medicine | Admitting: Emergency Medicine

## 2023-09-23 ENCOUNTER — Emergency Department (HOSPITAL_COMMUNITY): Payer: Medicare Other

## 2023-09-23 ENCOUNTER — Other Ambulatory Visit: Payer: Self-pay

## 2023-09-23 ENCOUNTER — Encounter (HOSPITAL_COMMUNITY): Payer: Self-pay | Admitting: Emergency Medicine

## 2023-09-23 DIAGNOSIS — W19XXXA Unspecified fall, initial encounter: Secondary | ICD-10-CM

## 2023-09-23 DIAGNOSIS — S0993XA Unspecified injury of face, initial encounter: Secondary | ICD-10-CM | POA: Diagnosis not present

## 2023-09-23 DIAGNOSIS — Z7901 Long term (current) use of anticoagulants: Secondary | ICD-10-CM | POA: Insufficient documentation

## 2023-09-23 DIAGNOSIS — S0990XA Unspecified injury of head, initial encounter: Secondary | ICD-10-CM | POA: Diagnosis not present

## 2023-09-23 DIAGNOSIS — W06XXXA Fall from bed, initial encounter: Secondary | ICD-10-CM | POA: Diagnosis not present

## 2023-09-23 NOTE — ED Notes (Signed)
Pt discharged home. Discharge information discussed. No s/s of distress observed during discharge. 

## 2023-09-23 NOTE — ED Provider Notes (Signed)
[place] EMERGENCY DEPARTMENT AT [facility] Provider Note   CSN: [id number] Arrival date & time: 09/23/23  1154     History  Chief Complaint  Patient presents with   Head Injury    On blood thinners    LONNEL GJERDE is a 67 y.o. male.  HPI Patient presents after fall with pain in his head, face that has essentially resolved. He is on Plavix due to history of prior stroke.  He notes that he rolled out of bed about 1 hour prior to ED arrival, striking his face.  Since that time he has had pain in his nose, face, head, no neck pain, no weakness in any extremity, no confusion, disorientation. He is accompanied by a male companion.     Home Medications Prior to Admission medications   Medication Sig Start Date End Date Taking? Authorizing Provider  acetaminophen (TYLENOL) 500 MG tablet Take 2 tablets (1,000 mg total) by mouth 3 (three) times daily. 08/11/23 11/09/23  Lorin Glass, MD  amLODipine (NORVASC) 5 MG tablet Take 5 mg by mouth daily. 05/28/23   [provider]  Cholecalciferol 1000 units TBDP Take 1 tablet by mouth daily.     [provider]  clopidogrel (PLAVIX) 75 MG tablet TAKE ONE TABLET BY MOUTH DAILY 04/23/23   Butch Penny, NP  dapagliflozin propanediol (FARXIGA) 10 MG TABS tablet Take 10 mg by mouth daily.    [provider]  divalproex (DEPAKOTE ER) 500 MG 24 hr tablet Take 2 tablets (1,000 mg total) by mouth at bedtime. 05/27/23   Cottle, Steva Ready., MD  DULoxetine (CYMBALTA) 60 MG capsule Take 2 capsules (120 mg total) by mouth daily. 05/27/23   Cottle, Steva Ready., MD  febuxostat (ULORIC) 40 MG tablet Take 40 mg by mouth daily.    [provider]  furosemide (LASIX) 20 MG tablet Take 20 mg by mouth every other day. 07/21/19   [provider]  gabapentin (NEURONTIN) 400 MG capsule Take 400 mg by mouth 2 (two) times daily.    [provider]  HYDROcodone-acetaminophen (NORCO/VICODIN) 5-325 MG tablet  Take 1 tablet by mouth every 6 (six) hours as needed for moderate pain. 09/01/23   Dione Booze, MD  KERENDIA 10 MG TABS Take 1 tablet by mouth daily. 03/18/23   [provider]  lamoTRIgine (LAMICTAL) 200 MG tablet TAKE ONE TABLET BY MOUTH IN THE MORNING 09/15/23   Cottle, Steva Ready., MD  LevOCARNitine (L-CARNITINE) 500 MG TABS Take 500 mg by mouth 2 (two) times daily.    [provider]  magnesium oxide (MAG-OX) 400 (240 Mg) MG tablet Take 400 mg by mouth daily.    [provider]  multivitamin-lutein (OCUVITE-LUTEIN) CAPS capsule Take 1 capsule by mouth daily.    [provider]  pramipexole (MIRAPEX) 0.5 MG tablet Take 1 tablet (0.5 mg total) by mouth 2 (two) times daily. 05/27/23   Cottle, Steva Ready., MD  predniSONE (DELTASONE) 20 MG tablet 2 tabs po daily x 4 days 09/01/23   Dione Booze, MD  pregabalin (LYRICA) 25 MG capsule Take 25mg  AM and 50mg  PM 09/19/23   Butch Penny, NP  pregabalin (LYRICA) 50 MG capsule Take 50 mg by mouth 2 (two) times daily.    [provider]  rosuvastatin (CRESTOR) 40 MG tablet Take 1 tablet (40 mg total) by mouth daily. 06/28/23 09/26/23  Jake Bathe, MD  tamsulosin (FLOMAX) 0.4 MG CAPS capsule Take 0.4 mg by  mouth daily. 06/04/22   [provider]  VELTASSA 8.4 g packet Take 8.4 g by mouth See admin instructions. Take one packet by mouth three times a week. Days vary.    [provider]      Allergies    Allopurinol, Penicillins, Finerenone, Penicillin g, Aspirin, and Ciprofloxacin    Review of Systems   Review of Systems  Physical Exam Updated Vital Signs BP (!) 142/87 (BP Location: Left Arm)   Pulse 62   Temp 98.1 F (36.7 C) (Oral)   Resp 18   Ht 5\' 10"  (1.778 m)   Wt 112.5 kg   SpO2 99%   BMI 35.58 kg/m  Physical Exam Vitals and nursing note reviewed.  Constitutional:      General: He is not in acute distress.    Appearance: He is well-developed.  HENT:     Head:  Normocephalic.   Eyes:     Conjunctiva/sclera: Conjunctivae normal.  Cardiovascular:     Rate and Rhythm: Normal rate and regular rhythm.  Pulmonary:     Effort: Pulmonary effort is normal. No respiratory distress.     Breath sounds: No stridor.  Abdominal:     General: There is no distension.  Musculoskeletal:     Cervical back: Full passive range of motion without pain. No spinous process tenderness or muscular tenderness.  Skin:    General: Skin is warm and dry.  Neurological:     Mental Status: He is alert and oriented to person, place, and time.     Motor: No weakness, tremor, atrophy, abnormal muscle tone or seizure activity.     ED Results / Procedures / Treatments   Labs (all labs ordered are listed, but only abnormal results are displayed) Labs Reviewed - No data to display  EKG None  Radiology CT Head Wo Contrast  Result Date: 09/23/2023 CLINICAL DATA:  Head trauma, minor (Age >= 65y); Facial trauma, blunt. Fall from bed with head strike on night stand. EXAM: CT HEAD WITHOUT CONTRAST CT MAXILLOFACIAL WITHOUT CONTRAST TECHNIQUE: Multidetector CT imaging of the head and maxillofacial structures were performed using the standard protocol without intravenous contrast. Multiplanar CT image reconstructions of the maxillofacial structures were also generated. RADIATION DOSE REDUCTION: This exam was performed according to the departmental dose-optimization program which includes automated exposure control, adjustment of the mA and/or kV according to patient size and/or use of iterative reconstruction technique. COMPARISON:  Head CT 11/05/2022. FINDINGS: CT HEAD FINDINGS Brain: No acute intracranial hemorrhage. Gray-white differentiation is preserved. No hydrocephalus or extra-axial collection. No mass effect or midline shift. Vascular: No hyperdense vessel or unexpected calcification. Skull: No calvarial fracture or suspicious bone lesion. Skull base is unremarkable. Other: None. CT  MAXILLOFACIAL FINDINGS Osseous: No fracture or mandibular dislocation. No destructive process. Orbits: Negative. No traumatic or inflammatory finding. Sinuses: Mild mucosal disease in the sphenoid sinuses, ethmoid air cells and frontal sinuses. Soft tissues: Unremarkable. IMPRESSION: CT HEAD: No acute intracranial abnormality. CT MAXILLOFACIAL: 1. No acute facial bone fracture. 2. Mild mucosal disease in the sphenoid sinuses, ethmoid air cells and frontal sinuses. Electronically Signed   By: Orvan Falconer M.D.   On: 09/23/2023 13:58   CT Maxillofacial Wo Contrast  Result Date: 09/23/2023 CLINICAL DATA:  Head trauma, minor (Age >= 65y); Facial trauma, blunt. Fall from bed with head strike on night stand. EXAM: CT HEAD WITHOUT CONTRAST CT MAXILLOFACIAL WITHOUT CONTRAST TECHNIQUE: Multidetector CT imaging of the head and maxillofacial structures were performed using the standard  protocol without intravenous contrast. Multiplanar CT image reconstructions of the maxillofacial structures were also generated. RADIATION DOSE REDUCTION: This exam was performed according to the departmental dose-optimization program which includes automated exposure control, adjustment of the mA and/or kV according to patient size and/or use of iterative reconstruction technique. COMPARISON:  Head CT 11/05/2022. FINDINGS: CT HEAD FINDINGS Brain: No acute intracranial hemorrhage. Gray-white differentiation is preserved. No hydrocephalus or extra-axial collection. No mass effect or midline shift. Vascular: No hyperdense vessel or unexpected calcification. Skull: No calvarial fracture or suspicious bone lesion. Skull base is unremarkable. Other: None. CT MAXILLOFACIAL FINDINGS Osseous: No fracture or mandibular dislocation. No destructive process. Orbits: Negative. No traumatic or inflammatory finding. Sinuses: Mild mucosal disease in the sphenoid sinuses, ethmoid air cells and frontal sinuses. Soft tissues: Unremarkable. IMPRESSION: CT  HEAD: No acute intracranial abnormality. CT MAXILLOFACIAL: 1. No acute facial bone fracture. 2. Mild mucosal disease in the sphenoid sinuses, ethmoid air cells and frontal sinuses. Electronically Signed   By: Orvan Falconer M.D.   On: 09/23/2023 13:58    Procedures Procedures    Medications Ordered in ED Medications - No data to display  ED Course/ Medical Decision Making/ A&P                                 Medical Decision Making Adult male presents after mechanical fall.  He is on Plavix so concern for intracranial injury bleed versus fracture as well as facial fracture considered. Patient had CT ordered, he declined initial pain medication offer. Reassuring neuroexam noted.  Amount and/or Complexity of Data Reviewed Independent Historian: friend Radiology: ordered and independent interpretation performed. Decision-making details documented in ED Course.  Risk Prescription drug management. Decision regarding hospitalization.   3:16 PM On repeat exam patient is awake alert in no distress, ambulatory, speaking clearly.  I reviewed the patient's CT scans, discussed with him and his male companion, patient discharged in stable condition.        Final Clinical Impression(s) / ED Diagnoses Final diagnoses:  Fall, initial encounter  Injury of head, initial encounter    Rx / DC Orders ED Discharge Orders     None         Gerhard Munch, MD 09/23/23 1516

## 2023-09-23 NOTE — ED Provider Triage Note (Signed)
Emergency Medicine Provider Triage Evaluation Note  HURSCHEL PAYNTER , a 67 y.o. male  was evaluated in triage.  Pt complains of small laceration to nose following fall and hitting nightstand. He reports that he was sleeping and rolled off the bed and hit nightstand. He has mild pain to bridge of his nose and cheek. He reports he is mostly here as a precaution due to being on plavix from stroke in 01/2022. He denies visual disturbances, headache, AMS, seizure like activity.  Review of Systems  Positive: Nose lac Negative: Visual disturbances,HA  Physical Exam  BP (!) 142/87 (BP Location: Left Arm)   Pulse 62   Temp 98.1 F (36.7 C) (Oral)   Resp 18   Ht 5\' 10"  (1.778 m)   Wt 112.5 kg   SpO2 99%   BMI 35.58 kg/m  Gen:   Awake, no distress   Resp:  Normal effort  MSK:   Moves extremities without difficulty  Other:    Medical Decision Making  Medically screening exam initiated at 12:16 PM.  Appropriate orders placed.  [name] was informed that the remainder of the evaluation will be completed by another provider, this initial triage assessment does not replace that evaluation, and the importance of remaining in the ED until their evaluation is complete.  Imaging ordered   Judithann Sheen, [place] 09/23/23 1219

## 2023-09-23 NOTE — Discharge Instructions (Signed)
As discussed, your evaluation today has been largely reassuring.  But, it is important that you monitor your condition carefully, and do not hesitate to return to the ED if you develop new, or concerning changes in your condition. ? ?Otherwise, please follow-up with your physician for appropriate ongoing care. ? ?

## 2023-09-23 NOTE — ED Triage Notes (Signed)
Patient arrives ambulatory by POV states he was having a bad dream and rolled out of bed causing him to hit his head on nightstand and then the ground. On plavix due to hx of stroke.

## 2023-09-30 ENCOUNTER — Encounter: Payer: Self-pay | Admitting: Psychiatry

## 2023-09-30 ENCOUNTER — Ambulatory Visit (INDEPENDENT_AMBULATORY_CARE_PROVIDER_SITE_OTHER): Payer: Medicare Other | Admitting: Psychiatry

## 2023-09-30 DIAGNOSIS — F3181 Bipolar II disorder: Secondary | ICD-10-CM | POA: Diagnosis not present

## 2023-09-30 DIAGNOSIS — F4310 Post-traumatic stress disorder, unspecified: Secondary | ICD-10-CM | POA: Diagnosis not present

## 2023-09-30 DIAGNOSIS — G4733 Obstructive sleep apnea (adult) (pediatric): Secondary | ICD-10-CM | POA: Diagnosis not present

## 2023-09-30 MED ORDER — DIVALPROEX SODIUM ER 500 MG PO TB24
1000.0000 mg | ORAL_TABLET | Freq: Every day | ORAL | 5 refills | Status: DC
Start: 2023-09-30 — End: 2024-04-28

## 2023-09-30 MED ORDER — LAMOTRIGINE 200 MG PO TABS
200.0000 mg | ORAL_TABLET | Freq: Every morning | ORAL | 1 refills | Status: DC
Start: 2023-09-30 — End: 2024-03-31

## 2023-09-30 MED ORDER — DULOXETINE HCL 60 MG PO CPEP
120.0000 mg | ORAL_CAPSULE | Freq: Every day | ORAL | 5 refills | Status: DC
Start: 2023-09-30 — End: 2024-04-28

## 2023-09-30 MED ORDER — PRAMIPEXOLE DIHYDROCHLORIDE 0.5 MG PO TABS
0.5000 mg | ORAL_TABLET | Freq: Two times a day (BID) | ORAL | 5 refills | Status: DC
Start: 2023-09-30 — End: 2024-04-28

## 2023-09-30 NOTE — Progress Notes (Signed)
Edwin [name] [id number] March 12, 1956 67 y.o.  Subjective:   Patient ID:  Edwin [name] is a 67 y.o. (DOB [birth year]) male.  Chief Complaint:  Chief Complaint  Patient presents with   Follow-up   Depression   Anxiety    Depression        Associated symptoms include no decreased concentration and no suicidal ideas.  Edwin [name] presents to the office today for follow-up of bipolar depression and anxiety.  seen July 02, 2019.  No meds were changed.  Patient was doing well.  He asked me to write a letter to his attorney and support his intention to marry a woman from overseas and that his mental health was sufficiently well that he would not represent a danger to his fiance.  This was discussed with his attorney and the letter was written.  Met girl June 8. Met her on dating site international.  [place] woman and decided wanted to get married.  Options opening up after being shut down by Covid.  seen November 2020.  No meds were changed.  He was overall doing well.  seen January 20, 2020 .  No med changed.  Following noted. Wonders about elevated liver enzymes and Depakote.  Labs not visible in Epic.  PCP Dr. Zenovia Jordan is working up this finding.   Doing great from mental health.  Talks daily to GF in [place].    03/23/20 the following is noted:    Taken on too many students.  Recognizes needs to have 2 days off a week and plans to schedule that.  Needs to get through end of the semester.  Fiance visa is difficult but she just got admitted to [facility] pending some exams being passed.  Visa difficult with Covid affecting travel.  She could be here in mid-July. No med changes  07/06/20 with the following noted: Still good. Pramipexole has kept depression at bay. Mood is still stable. Good interest in basketball. Still concerned about weight and blames meds and wonders about weight loss meds and supplements.  Asked about L-glutamine taken it for a week.  Trying to watch diet.   Hasn't weighed himself. Plan: No med changes indicated yet.  Sig risk in switching his primary mood stabilizer unless its absolutely nececessary.  10/05/20 appt noted: Consistent with meds.  Still doing great overall.  Some trouble sleeping and got help with CPAP with Dr. Earl Gala.  Tendency to sleep 3 hours and awakened.  May stay awake 1-2 hours and eventually get sufficient sleep.  Can awaken refreshed. No SE except weight gain.   Got denied for Ozempic.  Very concerned about weight gain with Depakote. Still facetiming with GF in [place] and no visa for her yet bc country still shut down. Plan no med changes  01/05/2021 appointment with the following noted: Expressed concerns about weight and new dx DM.   Disc retrying efforts to get Ozempic.   Has increase glucose.   Been on phone with GF in [place] for 20 mos.  It's dragging on trying to get her here.  Thinking about going there.   Previously lost the love of his life DT his illness.  Sense of loss bc her birthday was a couple of days ago.    05/03/2021 appointment with the following noted: Doing as well as anyone can be doing.  Flew to [place] in April.  People were nice.  Enjoyed the family and went with 27 people to the beach.  GF comes from family  of 9.  Going back in July and wedding July 30.  Had Face Timed for 22 mos and gotten to know the family.  She has not been to [place] yet but they plan to live here.  Spousal visas are back logged.   Has seen PCP and dietician and is losing weight. Plan: No med changes indicated yet.  Sig risk in switchin g his primary mood stabilizer unless its absolutely nececessary. Failed attempt to reduce pramipexole below 0.5 mg BID  08/30/21 appt noted: Pretty well.  Got married but she's not here yet.  Working on getting her into the [place].  Has a good immigration attorney here.  Usually takes a just a couple of mos but maybe longer  DT backlog.   Still taking meds Depakote 1000, duloxetine 120,  lamotrigine 200, pramipexole 0.5 mg BID.   Stopped soda.  Working with nutritionist and down 35#. 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   Disc psychology and masculinity goals. No SE.   Satisfied with meds and no indication to change. Plan: continue Depakote 1000, duloxetine 120, lamotrigine 200, pramipexole 0.5 mg BID.   Failed attempt to reduce pramipexole below 0.5 mg BID  01/01/22 appt noted: Going to [employer].   Very good mental health.   New wife is delayed getting here bc slow immigration.  She's in [place] right now.   Got married in [place]   OK with meds. CKD stable. Plan: No med changes indicated yet.  Sig risk in switching his primary mood stabilizer unless its absolutely nececessary. Depakote 1000, duloxetine 120, lamotrigine 200, pramipexole 0.5 mg BID.    05/01/2022 appointment with the following noted: March 15 dizzy and dx with stroke.  Thinks it might be related to untreated OSA bc CPAP machine burned up and couldn't get another.  O2 levels low in hospital in sleep. Appt  to see sleep doc soon.  Sleeping with O2 now.   No depression occurred.  Handled it well.  Lost 52# this year. Thankful for meds.   Sees Dr. Richardean Chimera 7/6 Applied for expedited Visa for wife. Plan: No med changes indicated.  Sig risk in switching his primary mood stabilizer unless its absolutely nececessary. Depakote 1000, duloxetine 120, lamotrigine 200, pramipexole 0.5 mg BID.  Gabapentin 400 BID Failed attempt to reduce pramipexole below 0.5 mg BID  09/17/22 appt noted: 06/30/22 ED stroke  then had rehab. Stroke in March mild but not again in 8/23.  Slowly better. Walking 2-4 miles daily and it has helped. Enjoys [employer] sports Patient reports stable mood and denies depressed or irritable moods.   Patient denies any recent difficulty with anxiety.  Patient denies difficulty with sleep initiation or maintenance.  Not Using CPAP some issues getting O2.. Sleep hygiene is  better now and that' s helped.  Going to bed earlier.   Denies appetite disturbance.  Patient reports that energy and motivation have been good.  Patient denies any difficulty with concentration.  Patient denies any suicidal ideation. Mood good wihtout swings or depression.   Satisfied with meds. No alcohol.   Plan no med changes from above  01/21/23 appt noted: Continues meds as above.  Tolerating meds except constipation.  Miralax helping. Been doing good but youngest cat died and he is grieving.  It was his favorite pet.  Didn't have to put her down.  Happened 2 mos ago.  His cat was Louie.  He was a happy cat.   No mood swings.  Been going to [facility] basketball games.   Visa process is going slow to get sig other (wife) here from [employer]. Taking a long time.   No mood swings.    05/27/23 appt noted: Newly married and wife here for 2 days.  4 yr process.  W Sweet.   Enjoys teaching and playing piano.   Playing some at [employer]. Continue meds. No new SE.  ? Wt gain related. Patient reports stable mood and denies depressed or irritable moods.  Patient denies any recent difficulty with anxiety.  Patient denies difficulty with sleep initiation or maintenance. Denies appetite disturbance.  Patient reports that energy and motivation have been good.  Patient denies any difficulty with concentration.  Patient denies any suicidal ideation. Has seen neuro and on gabapentin and Lyrica.   Still some PT as needed after the stroke.  Watching BP Hard losing another cat in May which mother named.   Plan: No med changes indicated.  Sig risk in switching his primary mood stabilizer unless its absolutely nececessary. Depakote 1000, duloxetine 120, lamotrigine 200, pramipexole 0.5 mg BID.  Still on gabapentin 400 mg BID  09/30/23 appt noted: Health issues since here with cellulitis, sepsis, fall, sciatica.   Lost cat of 17 years.  Got new cats. Married life is an adjustment.  Going well so far.    Switching from gabapentin to Lyrica for sciatica and paresthesia hand and foot. Still playing piano.   No SE with meds unless wt gain.   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 Mood has remained stable.   Able to get by with less sleep than in the past.  Getting 4-5 hours.  Can schedule nap if needed. Rejuvinating.  To sleep quickly.  No insomnia.   No dep .  No swings.  M died Jul 11, 2019.  Was a relief for her.    Past Psychiatric Medication Trials: Lithium increased Cr and switch to VPA 2012,  Depakote 1000, lamotrigine,  gabapentin, Vraylar tremor, Rexulti SE, Abilify increased weight,   Wellbutrin NR, phentermine,   Pramipexole 0.5 mg BID L-Carnitine  Psych hosp 1986 helpful   Review of Systems:  Review of Systems  Constitutional:  Negative for unexpected weight change.  Cardiovascular:  Negative for palpitations.  Musculoskeletal:  Positive for arthralgias and back pain.  Neurological:  Positive for weakness. Negative for tremors.       Paresthesia in hand  Psychiatric/Behavioral:  Negative for agitation, behavioral problems, confusion, decreased concentration, hallucinations, self-injury, sleep disturbance and suicidal ideas. The patient is not hyperactive.   No longer depressed.   Medications: I have reviewed the patient's current medications.  Current Outpatient Medications  Medication Sig Dispense Refill   acetaminophen (TYLENOL) 500 MG tablet Take 2 tablets (1,000 mg total) by mouth 3 (three) times daily. 180 tablet 2   amLODipine (NORVASC) 5 MG tablet Take 5 mg by mouth daily.     Cholecalciferol 1000 units TBDP Take 1 tablet by mouth daily.      clopidogrel (PLAVIX) 75 MG tablet TAKE ONE TABLET BY MOUTH DAILY 90 tablet 1   dapagliflozin propanediol (FARXIGA) 10 MG TABS tablet Take 10 mg by mouth daily.     divalproex (DEPAKOTE ER) 500 MG 24 hr tablet Take 2 tablets (1,000 mg total) by mouth at bedtime. 60 tablet 5   DULoxetine (CYMBALTA) 60 MG  capsule Take 2 capsules (120 mg total) by mouth daily. 60 capsule 5   febuxostat (ULORIC) 40 MG  tablet Take 40 mg by mouth daily.     furosemide (LASIX) 20 MG tablet Take 20 mg by mouth every other day.     gabapentin (NEURONTIN) 400 MG capsule Take 400 mg by mouth 2 (two) times daily. Transitioning to Lyrica     HYDROcodone-acetaminophen (NORCO/VICODIN) 5-325 MG tablet Take 1 tablet by mouth every 6 (six) hours as needed for moderate pain. 20 tablet 0   KERENDIA 10 MG TABS Take 1 tablet by mouth daily.     lamoTRIgine (LAMICTAL) 200 MG tablet TAKE ONE TABLET BY MOUTH IN THE MORNING 30 tablet 0   LevOCARNitine (L-CARNITINE) 500 MG TABS Take 500 mg by mouth 2 (two) times daily.     magnesium oxide (MAG-OX) 400 (240 Mg) MG tablet Take 400 mg by mouth daily.     multivitamin-lutein (OCUVITE-LUTEIN) CAPS capsule Take 1 capsule by mouth daily.     pramipexole (MIRAPEX) 0.5 MG tablet Take 1 tablet (0.5 mg total) by mouth 2 (two) times daily. 60 tablet 5   pregabalin (LYRICA) 25 MG capsule Take 25mg  AM and 50mg  PM 90 capsule 0   pregabalin (LYRICA) 50 MG capsule Take 50 mg by mouth 2 (two) times daily.     tamsulosin (FLOMAX) 0.4 MG CAPS capsule Take 0.4 mg by mouth daily.     VELTASSA 8.4 g packet Take 8.4 g by mouth See admin instructions. Take one packet by mouth three times a week. Days vary.     predniSONE (DELTASONE) 20 MG tablet 2 tabs po daily x 4 days (Patient not taking: Reported on 09/30/2023) 8 tablet 0   rosuvastatin (CRESTOR) 40 MG tablet Take 1 tablet (40 mg total) by mouth daily. 90 tablet 3   No current facility-administered medications for this visit.    Medication Side Effects: Other: weight gain.  Allergies:  Allergies  Allergen Reactions   Allopurinol Other (See Comments)    Made pt emotionally unstable  Other reaction(s): emotionally labile   Penicillins Hives and Other (See Comments)   Finerenone Other (See Comments)    hyperkalemia Other reaction(s): Increases   potassium levels   Penicillin G     Other reaction(s): hives   Aspirin Hives   Ciprofloxacin Rash    Other reaction(s): rash    Past Medical History:  Diagnosis Date   Allergic rhinitis    Bipolar disorder (HCC)    Bradycardia 2012    due to lithium   CKD (chronic kidney disease) stage 2, GFR 60-89 ml/min    DM2 (diabetes mellitus, type 2) (HCC)    Gout    Hypertension    Mild renal insufficiency    , with creatinine of 1.3 in 2010, was side effect of med   Obesity    ,moderate   Prostatitis    , Episodic   Stroke (HCC)    Suicide attempt (HCC) 09/20/2014    Family History  Problem Relation Age of Onset   Other Mother        Viral Meningitis   Mitral valve prolapse Mother    Arrhythmia Mother    Hypothyroidism Mother    Stroke Mother    Hypertension Father    Gout Father    Alzheimer's disease Father     Social History   Socioeconomic History   Marital status: Married    Spouse name: Sweet   Number of children: 0   Years of education: Not on file   Highest education level: Master's degree (e.g., MA, MS, MEng, MEd,  MSW, MBA)  Occupational History   Not on file  Tobacco Use   Smoking status: Never    Passive exposure: Never   Smokeless tobacco: Never  Substance and Sexual Activity   Alcohol use: No   Drug use: No   Sexual activity: Not on file  Other Topics Concern   Not on file  Social History Narrative   Lives alone   Left handed   Caffeine: 2 ice tea a day   Social Determinants of Health   Financial Resource Strain: Low Risk  (02/22/2022)   Overall Financial Resource Strain (CARDIA)    Difficulty of Paying Living Expenses: Not hard at all  Food Insecurity: No Food Insecurity (08/09/2023)   Hunger Vital Sign    Worried About Running Out of Food in the Last Year: Never true    Ran Out of Food in the Last Year: Never true  Transportation Needs: No Transportation Needs (08/09/2023)   PRAPARE - Administrator, Civil Service (Medical): No     Lack of Transportation (Non-Medical): No  Physical Activity: Not on file  Stress: No Stress Concern Present (02/22/2022)   [employer] of Occupational Health - Occupational Stress Questionnaire    Feeling of Stress : Only a little  Social Connections: Unknown (04/03/2022)   [facility]   Social Network    Social Network: Not on file  Intimate Partner Violence: Not At Risk (08/09/2023)   Humiliation, Afraid, Rape, and Kick questionnaire    Fear of Current or Ex-Partner: No    Emotionally Abused: No    Physically Abused: No    Sexually Abused: No    Past Medical History, Surgical history, Social history, and Family history were reviewed and updated as appropriate.   Please see review of systems for further details on the patient's review from today.   Objective:   Physical Exam:  There were no vitals taken for this visit.  Physical Exam Constitutional:      General: He is not in acute distress.    Appearance: He is well-developed.  Musculoskeletal:        General: No deformity.  Neurological:     Mental Status: He is alert and oriented to person, place, and time.     Motor: No tremor.     Coordination: Coordination normal.     Gait: Gait normal.  Psychiatric:        Attention and Perception: Attention normal. He is attentive.        Mood and Affect: Mood is not anxious or depressed. Affect is not labile, blunt or inappropriate.        Speech: Speech is not rapid and pressured.        Behavior: Behavior normal.        Thought Content: Thought content normal. Thought content is not delusional. Thought content does not include homicidal or suicidal ideation. Thought content does not include suicidal plan.        Cognition and Memory: Cognition normal.        Judgment: Judgment normal.     Comments: Insight is good.  Some situational anxiety managed Talkative and pleasant.      Lab Review:     Component Value Date/Time   NA 139  08/11/2023 0336   K 4.6 08/11/2023 0336   CL 108 08/11/2023 0336   CO2 22 08/11/2023 0336   GLUCOSE 100 (H) 08/11/2023 0336   BUN 29 (H) 08/11/2023 1610  CREATININE 1.86 (H) 08/11/2023 0336   CALCIUM 8.5 (L) 08/11/2023 0336   PROT 6.8 08/09/2023 1020   ALBUMIN 3.5 08/09/2023 1020   AST 32 08/09/2023 1020   ALT 19 08/09/2023 1020   ALKPHOS 61 08/09/2023 1020   BILITOT 1.1 08/09/2023 1020   GFRNONAA 40 (L) 08/11/2023 0336   GFRAA >60 02/04/2017 0525       Component Value Date/Time   WBC 8.3 08/11/2023 0336   RBC 3.78 (L) 08/11/2023 0336   HGB 11.7 (L) 08/11/2023 0336   HCT 37.2 (L) 08/11/2023 0336   PLT 128 (L) 08/11/2023 0336   MCV 98.4 08/11/2023 0336   MCH 31.0 08/11/2023 0336   MCHC 31.5 08/11/2023 0336   RDW 13.5 08/11/2023 0336   LYMPHSABS 1.2 08/11/2023 0336   MONOABS 1.1 (H) 08/11/2023 0336   EOSABS 0.5 08/11/2023 0336   BASOSABS 0.0 08/11/2023 0336    No results [name] for: "POCLITH", "LITHIUM"   Lab Results  Component Value Date   VALPROATE 35 (L) 07/01/2022    Sleep study dx severe OSA AHI 53.   .res Assessment: Plan:    Glendell was seen today for follow-up, depression and anxiety.  Diagnoses and all orders for this visit:  Bipolar II disorder (HCC)  PTSD (post-traumatic stress disorder)  Obstructive sleep apnea   30 min face to face time with patient was spent on counseling and coordination of care. We discussed  Mood remains stable . Switch from lithium to VPA in 2012 was followed up with CMP which was normal at the time except for elevated ammonia level.  Which was the reason for adding L-carnitine and this corrected the ammonia level.   Elevated liver enzymes resolved.  Having some other health issues.    Good response to the med combination.  Polypharmacy necessary.   Since 2017 on pramipexole has been much better with regard to mood stability.  He feels it causes weight gain but cannot maintain stability bc of it.  Using O2 consistently HS  and he requires less sleep.  Disc sleep hygiene and getting adequate quantity for mental health.  Sleep deprivation increases risk mania.   No med changes indicated.  Sig risk in switching his primary mood stabilizer unless its absolutely nececessary. Depakote 1000, duloxetine 120, lamotrigine 200, pramipexole 0.5 mg BID.  Still on gabapentin 400 mg BID  Failed attempt to reduce pramipexole below 0.5 mg BID Disc risk impulsivity and mania with this but he's had depression benefit without problems.  Disc MCR plans and gave info.  Elevated liver enzymes resolved.  FU 4 mos  Meredith Staggers, MD, DFAPA   Please see After Visit Summary for patient specific instructions.  Future Appointments  Date Time Provider Department Center  05/04/2024 10:00 AM Butch Penny, NP [facility] None     No orders of the defined types were placed in this encounter.      -------------------------------

## 2023-10-01 DIAGNOSIS — E291 Testicular hypofunction: Secondary | ICD-10-CM | POA: Diagnosis not present

## 2023-10-01 DIAGNOSIS — M545 Low back pain, unspecified: Secondary | ICD-10-CM | POA: Diagnosis not present

## 2023-10-03 ENCOUNTER — Other Ambulatory Visit: Payer: Self-pay | Admitting: *Deleted

## 2023-10-03 NOTE — Telephone Encounter (Signed)
I called [facility].  Pt received a bottle of 25mg  tablets (#90) taking 25mg  am and 50mg  pm.  (He may have been taking with his blister pack dose 25mg  po bid and  if so that dose would be  (that being 50mg  po am and 75mg  po pm).  I LMVM for pt to call back and clarify this.  His next blister pack due 10-07-2023.  (28 day supply).

## 2023-10-03 NOTE — Telephone Encounter (Signed)
Will call [facility] to check on his pill pack.

## 2023-10-07 MED ORDER — PREGABALIN 75 MG PO CAPS: 75.0000 mg | ORAL_CAPSULE | Freq: Two times a day (BID) | ORAL | 11 refills | Status: DC

## 2023-10-07 NOTE — Addendum Note (Signed)
Addended by: Hermenia Fiscal S on: 10/07/2023 11:29 AM   Modules accepted: Orders

## 2023-10-07 NOTE — Telephone Encounter (Signed)
I followed Edwin [name]'s instructions to gradually increase Pregablin and to reduce and stop Gabapentin. Now taking Pregablin 75 mg twice per Edwin [name]'s instructions. About to run out of Pregablin, need a prescription to reflect 75 mg twice a day.

## 2023-10-12 ENCOUNTER — Other Ambulatory Visit: Payer: Self-pay | Admitting: Adult Health

## 2023-10-14 NOTE — Telephone Encounter (Signed)
Patient had a fall with head injury on 09/23/23 where he was admitted to the ER.     Hospital notes reassuring neuroexam. Please confirm if medication refill is appropriate.

## 2023-10-15 DIAGNOSIS — E291 Testicular hypofunction: Secondary | ICD-10-CM | POA: Diagnosis not present

## 2023-10-16 ENCOUNTER — Ambulatory Visit: Payer: Medicare Other | Admitting: Internal Medicine

## 2023-10-22 DIAGNOSIS — J018 Other acute sinusitis: Secondary | ICD-10-CM | POA: Diagnosis not present

## 2023-10-24 ENCOUNTER — Emergency Department (HOSPITAL_COMMUNITY)
Admission: EM | Admit: 2023-10-24 | Discharge: 2023-10-24 | Disposition: A | Discharge status: Home / Self Care | Payer: Medicare Other | Attending: Emergency Medicine | Admitting: Emergency Medicine

## 2023-10-24 ENCOUNTER — Encounter (HOSPITAL_COMMUNITY): Payer: Self-pay | Admitting: Emergency Medicine

## 2023-10-24 ENCOUNTER — Other Ambulatory Visit: Payer: Self-pay

## 2023-10-24 DIAGNOSIS — N183 Chronic kidney disease, stage 3 unspecified: Secondary | ICD-10-CM | POA: Diagnosis not present

## 2023-10-24 DIAGNOSIS — I129 Hypertensive chronic kidney disease with stage 1 through stage 4 chronic kidney disease, or unspecified chronic kidney disease: Secondary | ICD-10-CM | POA: Insufficient documentation

## 2023-10-24 DIAGNOSIS — E1122 Type 2 diabetes mellitus with diabetic chronic kidney disease: Secondary | ICD-10-CM | POA: Insufficient documentation

## 2023-10-24 DIAGNOSIS — M7989 Other specified soft tissue disorders: Secondary | ICD-10-CM | POA: Diagnosis not present

## 2023-10-24 DIAGNOSIS — L03116 Cellulitis of left lower limb: Secondary | ICD-10-CM | POA: Diagnosis not present

## 2023-10-24 DIAGNOSIS — Z79899 Other long term (current) drug therapy: Secondary | ICD-10-CM | POA: Insufficient documentation

## 2023-10-24 DIAGNOSIS — Z7902 Long term (current) use of antithrombotics/antiplatelets: Secondary | ICD-10-CM | POA: Insufficient documentation

## 2023-10-24 DIAGNOSIS — L0390 Cellulitis, unspecified: Secondary | ICD-10-CM

## 2023-10-24 LAB — CBC WITH DIFFERENTIAL/PLATELET
Abs Immature Granulocytes: 0.05 10*3/uL (ref 0.00–0.07)
Basophils Absolute: 0.1 10*3/uL (ref 0.0–0.1)
Basophils Relative: 1 %
Eosinophils Absolute: 0.2 10*3/uL (ref 0.0–0.5)
Eosinophils Relative: 2 %
HCT: 39.3 % (ref 39.0–52.0)
Hemoglobin: 12.9 g/dL — ABNORMAL LOW (ref 13.0–17.0)
Immature Granulocytes: 1 %
Lymphocytes Relative: 13 %
Lymphs Abs: 1.3 10*3/uL (ref 0.7–4.0)
MCH: 30.3 pg (ref 26.0–34.0)
MCHC: 32.8 g/dL (ref 30.0–36.0)
MCV: 92.3 fL (ref 80.0–100.0)
Monocytes Absolute: 0.7 10*3/uL (ref 0.1–1.0)
Monocytes Relative: 7 %
Neutro Abs: 7.4 10*3/uL (ref 1.7–7.7)
Neutrophils Relative %: 76 %
Platelets: 174 10*3/uL (ref 150–400)
RBC: 4.26 MIL/uL (ref 4.22–5.81)
RDW: 14.2 % (ref 11.5–15.5)
WBC: 9.8 10*3/uL (ref 4.0–10.5)
nRBC: 0 % (ref 0.0–0.2)

## 2023-10-24 LAB — COMPREHENSIVE METABOLIC PANEL
ALT: 23 U/L (ref 0–44)
AST: 19 U/L (ref 15–41)
Albumin: 3.9 g/dL (ref 3.5–5.0)
Alkaline Phosphatase: 87 U/L (ref 38–126)
Anion gap: 10 (ref 5–15)
BUN: 43 mg/dL — ABNORMAL HIGH (ref 8–23)
CO2: 23 mmol/L (ref 22–32)
Calcium: 9.1 mg/dL (ref 8.9–10.3)
Chloride: 108 mmol/L (ref 98–111)
Creatinine, Ser: 1.63 mg/dL — ABNORMAL HIGH (ref 0.61–1.24)
GFR, Estimated: 46 mL/min — ABNORMAL LOW (ref >60)
Glucose, Bld: 160 mg/dL — ABNORMAL HIGH (ref 70–99)
Potassium: 4.4 mmol/L (ref 3.5–5.1)
Sodium: 141 mmol/L (ref 135–145)
Total Bilirubin: 0.4 mg/dL (ref <1.2)
Total Protein: 7.1 g/dL (ref 6.5–8.1)

## 2023-10-24 LAB — LACTIC ACID, PLASMA: Lactic Acid, Venous: 1.9 mmol/L (ref 0.5–1.9)

## 2023-10-24 MED ORDER — DEXTROSE 5 % IV SOLN
1500.0000 mg | Freq: Once | INTRAVENOUS | Status: AC
  Administered 2023-10-24: 1500 mg via INTRAVENOUS
  Filled 2023-10-24: qty 75

## 2023-10-24 NOTE — ED Provider Notes (Signed)
[name]   CSN: [id number] Arrival date & time: 10/24/23  8295     History  Chief Complaint  Patient presents with   Leg Swelling    Edwin [name] is a 67 y.o. male.  Patient presents to the emergency room complaining of 2 to 3 days of left lower extremity swelling with erythema and warmth.  Patient states he has a history of cellulitis requiring antibiotics in the past.  He has been taking doxycycline for the past 2 days due to a sinus infection.  He denies pain, denies difficulty with ambulation.  Past medical history sniffer hypertension gout, stage III CKD, type II DM, CVA  HPI     Home Medications Prior to Admission medications   Medication Sig Start Date End Date Taking? Authorizing Provider  acetaminophen (TYLENOL) 500 MG tablet Take 2 tablets (1,000 mg total) by mouth 3 (three) times daily. 08/11/23 11/09/23  Lorin Glass, MD  amLODipine (NORVASC) 5 MG tablet Take 5 mg by mouth daily. 05/28/23   [provider]  Cholecalciferol 1000 units TBDP Take 1 tablet by mouth daily.     [provider]  clopidogrel (PLAVIX) 75 MG tablet TAKE ONE TABLET BY MOUTH DAILY 10/14/23   Butch Penny, NP  dapagliflozin propanediol (FARXIGA) 10 MG TABS tablet Take 10 mg by mouth daily.    [provider]  divalproex (DEPAKOTE ER) 500 MG 24 hr tablet Take 2 tablets (1,000 mg total) by mouth at bedtime. 09/30/23   Cottle, Steva Ready., MD  DULoxetine (CYMBALTA) 60 MG capsule Take 2 capsules (120 mg total) by mouth daily. 09/30/23   Cottle, Steva Ready., MD  febuxostat (ULORIC) 40 MG tablet Take 40 mg by mouth daily.    [provider]  furosemide (LASIX) 20 MG tablet Take 20 mg by mouth every other day. 07/21/19   [provider]  gabapentin (NEURONTIN) 400 MG capsule Take 400 mg by mouth 2 (two) times daily. Transitioning to Lyrica    [provider]  HYDROcodone-acetaminophen  (NORCO/VICODIN) 5-325 MG tablet Take 1 tablet by mouth every 6 (six) hours as needed for moderate pain. 09/01/23   Dione Booze, MD  KERENDIA 10 MG TABS Take 1 tablet by mouth daily. 03/18/23   [provider]  lamoTRIgine (LAMICTAL) 200 MG tablet Take 1 tablet (200 mg total) by mouth every morning. 09/30/23   Cottle, Steva Ready., MD  LevOCARNitine (L-CARNITINE) 500 MG TABS Take 500 mg by mouth 2 (two) times daily.    [provider]  magnesium oxide (MAG-OX) 400 (240 Mg) MG tablet Take 400 mg by mouth daily.    [provider]  multivitamin-lutein (OCUVITE-LUTEIN) CAPS capsule Take 1 capsule by mouth daily.    [provider]  pramipexole (MIRAPEX) 0.5 MG tablet Take 1 tablet (0.5 mg total) by mouth 2 (two) times daily. 09/30/23   Cottle, Steva Ready., MD  predniSONE (DELTASONE) 20 MG tablet 2 tabs po daily x 4 days Patient not taking: Reported on 09/30/2023 09/01/23   Dione Booze, MD  pregabalin (LYRICA) 25 MG capsule Take 25mg  AM and 50mg  PM 09/19/23   Butch Penny, NP  pregabalin (LYRICA) 75 MG capsule Take 1 capsule (75 mg total) by mouth 2 (two) times daily. 10/07/23   Butch Penny, NP  rosuvastatin (CRESTOR) 40 MG tablet Take 1 tablet (40 mg total) by mouth daily. 06/28/23 09/26/23  Jake Bathe, MD  tamsulosin [facility])  0.4 MG CAPS capsule Take 0.4 mg by mouth daily. 06/04/22   [provider]  VELTASSA 8.4 g packet Take 8.4 g by mouth See admin instructions. Take one packet by mouth three times a week. Days vary.    [provider]      Allergies    Allopurinol, Penicillins, Finerenone, Penicillin g, Aspirin, and Ciprofloxacin    Review of Systems   Review of Systems  Physical Exam Updated Vital Signs BP (!) 153/83   Pulse 64   Temp 98.4 F (36.9 C) (Oral)   Resp (!) 24   SpO2 96%  Physical Exam Vitals and nursing note reviewed.  HENT:     Head: Normocephalic and atraumatic.  Eyes:     Pupils: Pupils are equal, round,  and reactive to light.  Cardiovascular:     Rate and Rhythm: Normal rate.  Pulmonary:     Effort: Pulmonary effort is normal. No respiratory distress.  Musculoskeletal:        General: No signs of injury.     Cervical back: Normal range of motion.  Skin:    General: Skin is dry.     Comments: Left lower extremity erythematous below the knee to the level of the upper foot, warm to the touch, swollen  Neurological:     Mental Status: He is alert.  Psychiatric:        Speech: Speech normal.        Behavior: Behavior normal.     ED Results / Procedures / Treatments   Labs (all labs ordered are listed, but only abnormal results are displayed) Labs Reviewed  COMPREHENSIVE METABOLIC PANEL - Abnormal; Notable for the following components:      Result Value   Glucose, Bld 160 (*)    BUN 43 (*)    Creatinine, Ser 1.63 (*)    GFR, Estimated 46 (*)    All other components within normal limits  CBC WITH DIFFERENTIAL/PLATELET - Abnormal; Notable for the following components:   Hemoglobin 12.9 (*)    All other components within normal limits  LACTIC ACID, PLASMA  LACTIC ACID, PLASMA    EKG None  Radiology No results found.  Procedures Procedures    Medications Ordered in ED Medications  dalbavancin (DALVANCE) 1,500 mg in dextrose 5 % 500 mL IVPB (has no administration in time range)    ED Course/ Medical Decision Making/ A&P                                 Medical Decision Making Amount and/or Complexity of Data Reviewed Labs: ordered.   This patient presents to the ED for concern of left lower extremity swelling, this involves an extensive number of treatment options, and is a complaint that carries with it a high risk of complications and morbidity.  The differential diagnosis includes cellulitis, DVT, lipedema, others   Co morbidities that complicate the patient evaluation  History of cellulitis   Additional history obtained:   External records from outside  source obtained and reviewed including discharge summary from July when patient was admitted due to cellulitis   Lab Tests:  I Ordered, and personally interpreted labs.  The pertinent results include: Creatinine 1.63 improved from recent; lactic acid 1.9   Problem List / ED Course / Critical interventions / Medication management   I ordered medication including Dalvance for cellulitis Reevaluation of the patient after these medicines showed that the  patient stayed the same I have reviewed the patients home medicines and have made adjustments as needed   Test / Admission - Considered:  Patient with findings consistent with cellulitis.  Patient has history of the same.  He has had doxycycline as an outpatient for a different infection but it has not improved his cellulitis.  Will treat with Dalvance and discharged home with recommendations for follow-up with primary care and infectious disease.         Final Clinical Impression(s) / ED Diagnoses Final diagnoses:  Cellulitis of left lower extremity    Rx / DC Orders ED Discharge Orders          Ordered    Ambulatory referral to Infectious Disease       Comments: Cellulitis patient:  Received dalbavancin on 10/24/2023.   10/24/23 0548              Darrick Grinder, PA-C 10/24/23 7829    Glynn Octave, MD 10/24/23 [phone]

## 2023-10-24 NOTE — Discharge Instructions (Signed)
You were treated today with Dalvance for your cellulitis.  Please follow-up with your primary care provider.  You will also follow-up with infectious disease.  They should contact you to discuss your cellulitis.  If you develop any life-threatening symptoms return to the emergency department.

## 2023-10-24 NOTE — Progress Notes (Signed)
Pharmacy Note:  Dalbavancin for Acute Bacterial Skin and Skin Structure Infection (ABSSSI) Patients to [facility] Discharge Edwin [name] is an 67 y.o. male who presented to [facility] on 10/24/2023 with an Acute Bacterial Skin and Skin Structure Infection  Inclusion criteria - Indication []  Moderately large skin lesion (>=75 cm2 or larger - about the size of a baseball) [x]  Cellulitis   Patient was evaluated for the following exclusion criteria and no exclusions were found  Hardware involvement, Hypotension / shock, Elevated lactate (>2) without other explanation, gram-negative infection risk factors (bites, water exposure, infection after trauma, infection after skin graft, neutropenia, burns, severe immunocompromise), necrotizing fasciitis possible or confirmed, Known or suspected osteomyelitis or septic arthritis, endocarditis, diabetic foot infection, ischemic ulcers, post-operative wound infection, perirectal infections, need for drainage in the operating room, hand or facial infections, injection drug users with a fever, bacteremia, pregnancy or breastfeeding, allergy to related antibiotics like vancomycin, known liver disease (t.bili >2x ULN or AST/ALT 3x ULN)  Dalbavancin 1500mg  IV x 1 dose  Maryellen Pile, PharmD 10/24/2023, 5:50 AM Clinical Pharmacist

## 2023-10-24 NOTE — ED Triage Notes (Signed)
Swelling to L lower extremity. Reddened and swollen. Hx of cellulitis in past. Denies pain. Feels hot to touch.

## 2023-10-30 ENCOUNTER — Ambulatory Visit: Payer: Medicare Other | Admitting: Infectious Diseases

## 2023-11-01 DIAGNOSIS — E291 Testicular hypofunction: Secondary | ICD-10-CM | POA: Diagnosis not present

## 2023-11-05 ENCOUNTER — Telehealth: Payer: Self-pay

## 2023-11-05 ENCOUNTER — Other Ambulatory Visit: Payer: Self-pay

## 2023-11-05 ENCOUNTER — Ambulatory Visit: Payer: Medicare Other | Admitting: Infectious Diseases

## 2023-11-05 ENCOUNTER — Ambulatory Visit (HOSPITAL_COMMUNITY)
Admission: RE | Admit: 2023-11-05 | Discharge: 2023-11-05 | Disposition: A | Discharge status: Home / Self Care | Payer: Medicare Other | Source: Ambulatory Visit | Attending: Vascular Surgery | Admitting: Vascular Surgery

## 2023-11-05 VITALS — BP 126/77 | HR 79 | Temp 98.1°F | Resp 16 | Wt 259.0 lb

## 2023-11-05 DIAGNOSIS — Z880 Allergy status to penicillin: Secondary | ICD-10-CM | POA: Diagnosis not present

## 2023-11-05 DIAGNOSIS — M7989 Other specified soft tissue disorders: Secondary | ICD-10-CM | POA: Insufficient documentation

## 2023-11-05 DIAGNOSIS — L03116 Cellulitis of left lower limb: Secondary | ICD-10-CM

## 2023-11-05 DIAGNOSIS — Z79899 Other long term (current) drug therapy: Secondary | ICD-10-CM | POA: Insufficient documentation

## 2023-11-05 NOTE — Progress Notes (Unsigned)
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   Allergic rhinitis 02/22/2022   Bipolar disorder (HCC) 02/22/2022   Body mass index (BMI) 37.0-37.9, adult 02/22/2022   Bradycardia 02/22/2022   Cat scratch of forearm 02/22/2022   Cerebral infarction (HCC) 02/22/2022   Closed fracture of metatarsal bone 02/22/2022   Constipation 02/22/2022   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   Dyslipidemia 02/22/2022   Edema 02/22/2022   Electrocardiogram abnormal 02/22/2022   Elevated PSA 02/22/2022   Enthesopathy 02/22/2022   Erectile dysfunction 02/22/2022   History of small bowel obstruction 02/22/2022   Hyperglycemia 02/22/2022   Hyperglycemia due to type 2 diabetes mellitus (HCC) 02/22/2022   Insomnia 02/22/2022   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   Impairment of balance 02/22/2022   Major depression, single episode 02/22/2022   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   Melena 02/22/2022   Mixed hyperlipidemia 02/22/2022   Obstructive sleep apnea syndrome 02/22/2022   Other long term (current) drug therapy 02/22/2022   Encounter for immunization 02/22/2022   Right lower quadrant pain 02/22/2022   Severe recurrent major depression without psychotic features (HCC) 02/22/2022   Umbilical hernia 02/22/2022   Vitamin D deficiency 02/22/2022   Left-sided weakness 02/04/2022   DM2 (diabetes mellitus, type 2) (HCC) 02/04/2022   CVA (cerebral vascular accident) (HCC) 02/04/2022   Enteritis of ileum 02/02/2017    Intestinal obstruction (HCC) 02/02/2017   Persistent recurrent vomiting 02/01/2017   Chronic kidney disease, stage 3 unspecified (HCC) 05/19/2016   Abdominal pain 05/19/2016   Hypokalemia 05/19/2016   Primary gout    Bipolar affective disorder, currently depressed, moderate (HCC) 09/20/2014   Essential hypertension 11/23/2013   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   AMLODIPINE (NORVASC) 5 MG TABLET    Take 5 mg by mouth daily.   CHOLECALCIFEROL 1000 UNITS TBDP    Take 1 tablet by mouth daily.    CLOPIDOGREL (PLAVIX) 75 MG TABLET    TAKE ONE TABLET BY MOUTH DAILY   DAPAGLIFLOZIN PROPANEDIOL (FARXIGA) 10 MG TABS TABLET    Take 10 mg by mouth daily.   DIVALPROEX (DEPAKOTE ER) 500 MG 24 HR TABLET    Take 2 tablets (1,000 mg total) by mouth at bedtime.   DULOXETINE (CYMBALTA) 60 MG CAPSULE    Take 2 capsules (120 mg total) by mouth daily.   FEBUXOSTAT (ULORIC) 40 MG TABLET    Take 40 mg by mouth daily.   FUROSEMIDE (LASIX) 20 MG TABLET    Take 20 mg by mouth every other day.   GABAPENTIN (NEURONTIN) 400 MG CAPSULE    Take 400 mg by mouth 2 (two) times daily. Transitioning to Lyrica   HYDROCODONE-ACETAMINOPHEN (NORCO/VICODIN) 5-325 MG TABLET    Take 1 tablet by mouth every 6 (six) hours as needed for moderate pain.   KERENDIA 10 MG  TABS    Take 1 tablet by mouth daily.   LAMOTRIGINE (LAMICTAL) 200 MG TABLET    Take 1 tablet (200 mg total) by mouth every morning.   LEVOCARNITINE (L-CARNITINE) 500 MG TABS    Take 500 mg by mouth 2 (two) times daily.   MAGNESIUM OXIDE (MAG-OX) 400 (240 MG) MG TABLET    Take 400 mg by mouth daily.   MULTIVITAMIN-LUTEIN (OCUVITE-LUTEIN) CAPS CAPSULE    Take 1 capsule by mouth daily.   PRAMIPEXOLE (MIRAPEX) 0.5 MG TABLET    Take 1 tablet (0.5 mg total) by mouth 2 (two) times  daily.   PREDNISONE (DELTASONE) 20 MG TABLET    2 tabs po daily x 4 days   PREGABALIN (LYRICA) 25 MG CAPSULE    Take 25mg  AM and 50mg  PM   PREGABALIN (LYRICA) 75 MG CAPSULE    Take 1 capsule (75 mg total) by mouth 2 (two) times daily.   ROSUVASTATIN (CRESTOR) 40 MG TABLET    Take 1 tablet (40 mg total) by mouth daily.   TAMSULOSIN (FLOMAX) 0.4 MG CAPS CAPSULE    Take 0.4 mg by mouth daily.   VELTASSA 8.4 G PACKET    Take 8.4 g by mouth See admin instructions. Take one packet by mouth three times a week. Days vary.  Modified Medications   No medications on file  Discontinued Medications   No medications on file    Subjective: Discussed the use of AI scribe software for clinical note transcription with the patient, who gave verbal consent to proceed.   67 Y O male with PMH of DM2, HTN, CKD, Gout, obesity, CVA who is referred from ED after recent visit for cellulitis.    Seen in the ED on 12/5 for cellulitis and received one dose of IV dalbavancin, CBC and CMP unremarkable. Reports recurrent episodes of cellulitis in left leg. Reports three episodes this year, with the first episode ? related to insect bites last spring ( not sure).  Admitted in July for left leg cellulitis after failed p.o. antibiotics, treated with IV vancomycin inpatient and was discharged and doxycycline and cephalexin after improvement.  Admitted in September 2024 for left leg cellulitis after failed outpatient Bactrim, treated with IV vancomycin and cefepime inpatient and was discharged on p.o.  cephalexin after improvement with last ED visit on 12/5 as above.   Describes each episode with swelling and tightness, but no redness, tenderness, or systemic symptoms such as fever, chills, nausea, or vomiting. Reports resolution of symptoms with antibiotics. however, the swelling in the left leg has persisted.  He denies any history of vascular disease in the leg and has not had any recent ultrasounds of the leg.   Reports a  history of penicillin allergy, which manifested as hives when they were 67 years old. Reports taking Keflex without any adverse reactions.  Married, has 3 cats at home, all of them are declawed,  one of the kitten climbs on his back and legs sometimes but no scratches or bites. Denies smoking, alcohol and IVDU  Review of Systems: All systems reviewed including MSK negative  Past Medical History:  Diagnosis Date   Allergic rhinitis    Bipolar disorder (HCC)    Bradycardia 2012    due to lithium   CKD (chronic kidney disease) stage 2, GFR 60-89 ml/min    DM2 (diabetes mellitus, type 2) (HCC)    Gout    Hypertension    Mild renal insufficiency    , with  creatinine of 1.3 in 2010, was side effect of med   Obesity    ,moderate   Prostatitis    , Episodic   Stroke [facility])    Suicide attempt (HCC) 09/20/2014   Past Surgical History:  Procedure Laterality Date   APPENDECTOMY     CATARACT EXTRACTION Right    COLONOSCOPY WITH PROPOFOL N/A 06/14/2014   Procedure: COLONOSCOPY WITH PROPOFOL;  Surgeon: Charolett Bumpers, MD;  Location: [facility] ENDOSCOPY;  Service: Endoscopy;  Laterality: N/A;   TONSILLECTOMY     UMBILICAL HERNIA REPAIR      Social History   Tobacco Use   Smoking status: Never    Passive exposure: Never   Smokeless tobacco: Never  Substance Use Topics   Alcohol use: No   Drug use: No    Family History  Problem Relation Age of Onset   Other Mother        Viral Meningitis   Mitral valve prolapse Mother    Arrhythmia Mother    Hypothyroidism Mother    Stroke Mother    Hypertension Father    Gout Father    Alzheimer's disease Father     Allergies  Allergen Reactions   Allopurinol Other (See Comments)    Made pt emotionally unstable  Other reaction(s): emotionally labile   Penicillins Hives and Other (See Comments)   Finerenone Other (See Comments)    hyperkalemia Other reaction(s): Increases  potassium levels   Penicillin G     Other reaction(s): hives    Aspirin Hives   Ciprofloxacin Rash    Other reaction(s): rash    Health Maintenance  Topic Date Due   Medicare Annual Wellness (AWV)  Never done   Pneumonia Vaccine 28+ Years old (1 of 2 - PCV) Never done   FOOT EXAM  Never done   OPHTHALMOLOGY EXAM  Never done   Diabetic kidney evaluation - Urine ACR  Never done   Hepatitis C Screening  Never done   Zoster Vaccines- Shingrix (1 of 2) 04/24/1977   COVID-19 Vaccine (3 - Pfizer risk series) 03/23/2020   INFLUENZA VACCINE  06/20/2023   DTaP/Tdap/Td (2 - Td or Tdap) 10/04/2023   HEMOGLOBIN A1C  11/30/2023   Colonoscopy  06/14/2024   Diabetic kidney evaluation - eGFR measurement  10/23/2024   HIV Screening  Completed   HPV VACCINES  Aged Out    Objective: BP 126/77   Pulse 79   Temp 98.1 F (36.7 C) (Temporal)   Resp 16   Wt 259 lb (117.5 kg)   SpO2 99%   BMI 37.16 kg/m    Physical Exam Constitutional:      Appearance: Normal appearance.  Morbidly obese HENT:     Head: Normocephalic and atraumatic.      Mouth: Mucous membranes are moist.  Eyes:    Conjunctiva/sclera: Conjunctivae normal.     Pupils: Pupils are equal, round, and bilaterally symmetrical   Cardiovascular:     Rate and Rhythm: Normal rate and regular rhythm.     Heart sounds: s1s2  Pulmonary:     Effort: Pulmonary effort is normal.     Breath sounds: Normal breath sounds.   Abdominal:     General: Non distended     Palpations: soft. Non tender, BS+  Musculoskeletal:        General: Normal range of motion. Ambulatory  Left leg with swelling but no current evidence of cellulitis like no redness, warmth, tenderness, crepitus or fluctuance  Some chronic changes of  possible venous hypertension in the lower legs b/l   Skin:    General: Skin is warm and dry.     Comments:  Neurological:     General: grossly non focal     Mental Status: awake, alert and oriented to person, place, and time.   Psychiatric:        Mood and Affect: Mood normal.    Lab Results Lab Results  Component Value Date   WBC 9.8 10/24/2023   HGB 12.9 (L) 10/24/2023   HCT 39.3 10/24/2023   MCV 92.3 10/24/2023   PLT 174 10/24/2023    Lab Results  Component Value Date   CREATININE 1.63 (H) 10/24/2023   BUN 43 (H) 10/24/2023   NA 141 10/24/2023   K 4.4 10/24/2023   CL 108 10/24/2023   CO2 23 10/24/2023    Lab Results  Component Value Date   ALT 23 10/24/2023   AST 19 10/24/2023   ALKPHOS 87 10/24/2023   BILITOT 0.4 10/24/2023    Lab Results  Component Value Date   CHOL 160 07/01/2022   HDL 46 07/01/2022   LDLCALC 105 (H) 07/01/2022   TRIG 45 07/01/2022   CHOLHDL 3.5 07/01/2022   No results found for: "LABRPR", "RPRTITER" No results found for: "HIV1RNAQUANT", "HIV1RNAVL", "CD4TABS"   Assessment/plan 52 Y O male with PMH of DM2 ( A1c 5.3), HTN, CKD, Gout, obesity, CVA who is referred from ED after recent visit for recurrent cellulitis.    # left leg cellulitis  - s/p oritavancin 12/5, resolved  - no further need for abtx currently. Defer chronic antibiotic prophylaxis for now  - Unclear cause for recurrent cellulitis, referred to Vascular for r/o any vascular disease   # Left leg swelling  - venous US of left leg to r/o DVT - no signs and symptoms or PE  # Penicillin allergy  - reports tolerating cephalexin in the past without any issues  - Referral to allergy   # Morbid Obesity - will benefit from weight loss  I have personally spent 62  minutes involved in face-to-face and non-face-to-face activities for this patient on the day of the visit. Professional time spent includes the following activities: Preparing to see the patient (review of tests), Obtaining and/or reviewing separately obtained history (admission/discharge record), Performing a medically appropriate examination and/or evaluation , Ordering medications/tests/procedures, referring and communicating with other health care professionals, Documenting clinical  information in the EMR, Independently interpreting results (not separately reported), Communicating results to the patient/family/caregiver, Counseling and educating the patient/family/caregiver and Care coordination (not separately reported).   Of note, portions of this note may have been created with voice recognition software. While this note has been edited for accuracy, occasional wrong-word or 'sound-a-like' substitutions may have occurred due to the inherent limitations of voice recognition software.   Victoriano Lain, MD [facility] 11/05/2023, 9:13 AM

## 2023-11-05 NOTE — Telephone Encounter (Signed)
Per [employer] no prior authorization required for venous duplex of LLE. Reference number: [id number]  Patient scheduled for venous duplex of LLE at 10:30 AM. Patient agreeable with appointment time.    Vedant Shehadeh Lesli Albee, CMA

## 2023-11-06 DIAGNOSIS — Z880 Allergy status to penicillin: Secondary | ICD-10-CM | POA: Insufficient documentation

## 2023-11-06 DIAGNOSIS — M7989 Other specified soft tissue disorders: Secondary | ICD-10-CM | POA: Insufficient documentation

## 2023-11-08 ENCOUNTER — Other Ambulatory Visit: Payer: Self-pay | Admitting: *Deleted

## 2023-11-08 DIAGNOSIS — L821 Other seborrheic keratosis: Secondary | ICD-10-CM | POA: Diagnosis not present

## 2023-11-08 DIAGNOSIS — D225 Melanocytic nevi of trunk: Secondary | ICD-10-CM | POA: Diagnosis not present

## 2023-11-08 DIAGNOSIS — M7989 Other specified soft tissue disorders: Secondary | ICD-10-CM

## 2023-11-08 DIAGNOSIS — L814 Other melanin hyperpigmentation: Secondary | ICD-10-CM | POA: Diagnosis not present

## 2023-11-11 ENCOUNTER — Ambulatory Visit (HOSPITAL_COMMUNITY)
Admission: RE | Admit: 2023-11-11 | Discharge: 2023-11-11 | Disposition: A | Discharge status: Home / Self Care | Payer: Medicare Other | Source: Ambulatory Visit | Attending: Surgery | Admitting: Surgery

## 2023-11-11 DIAGNOSIS — M79605 Pain in left leg: Secondary | ICD-10-CM | POA: Insufficient documentation

## 2023-11-11 DIAGNOSIS — M7989 Other specified soft tissue disorders: Secondary | ICD-10-CM | POA: Diagnosis not present

## 2023-11-12 MED ORDER — PREGABALIN 100 MG PO CAPS
100.0000 mg | ORAL_CAPSULE | Freq: Two times a day (BID) | ORAL | 5 refills | Status: DC
Start: 2023-11-12 — End: 2024-02-07

## 2023-11-12 NOTE — Telephone Encounter (Signed)
As per last note, he is tolerating the 75mg  po bid well, no side effects.  Would like to try pregabalin 100mg  po bid. Order started.

## 2023-11-12 NOTE — Addendum Note (Signed)
Addended by: Guy Begin on: 11/12/2023 08:08 AM   Modules accepted: Orders

## 2023-11-14 DIAGNOSIS — E291 Testicular hypofunction: Secondary | ICD-10-CM | POA: Diagnosis not present

## 2023-11-19 ENCOUNTER — Ambulatory Visit: Payer: Medicare Other | Admitting: Allergy & Immunology

## 2023-11-25 ENCOUNTER — Ambulatory Visit: Payer: Medicare Other | Admitting: Physician Assistant

## 2023-11-25 VITALS — BP 131/85 | HR 80 | Temp 99.1°F | Resp 24 | Ht 70.0 in | Wt 269.0 lb

## 2023-11-25 DIAGNOSIS — I872 Venous insufficiency (chronic) (peripheral): Secondary | ICD-10-CM | POA: Diagnosis not present

## 2023-11-25 DIAGNOSIS — M7989 Other specified soft tissue disorders: Secondary | ICD-10-CM

## 2023-11-25 NOTE — Progress Notes (Signed)
 [facility] [place]   Reason for referral: Swollen left  leg  History of Present Illness  Edwin [name] is a 68 y.o. male who presents with chief complaint: swollen leg.  Patient notes, onset of swelling years ago, associated with decreased activity and a job that is seated..  The patient has had no history of DVT, no history of varicose vein, no history of venous stasis ulcers, no history of  Lymphedema and no history of skin changes in lower legs.  There is no family history of venous disorders.  The patient has used OTC compression stockings in the past.  He reports 2 episodes of cellulitis once in July 2024 and then again in September 2024.   He denies weeping or venous wounds.  He denies claudication, rest pain or non healing wounds.  He was walking 3-4 miles at a time until this past Summer.  He was helping his wife with citizenship issue into [place].  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    Past medical history includes: DM, CKD stage 3, Dyslipidemia, Bipolar disorder.  He is medically managed on Plavix  and a daily Statin.    Past Medical History:  Diagnosis Date   Allergic rhinitis    Bipolar disorder (HCC)    Bradycardia 2012    due to lithium   CKD (chronic kidney disease) stage 2, GFR 60-89 ml/min    DM2 (diabetes mellitus, type 2) (HCC)    Gout    Hypertension    Mild renal insufficiency    , with creatinine of 1.3 in 2010, was side effect of med   Obesity    ,moderate   Prostatitis    , Episodic   Stroke [facility])    Suicide attempt (HCC) 09/20/2014    Past Surgical History:  Procedure Laterality Date   APPENDECTOMY     CATARACT EXTRACTION Right    COLONOSCOPY WITH PROPOFOL  N/A 06/14/2014   Procedure: COLONOSCOPY WITH PROPOFOL ;  Surgeon: Gladis MARLA Louder, MD;  Location: [facility];  Service: Endoscopy;  Laterality: N/A;   TONSILLECTOMY     UMBILICAL HERNIA REPAIR      Social History   Socioeconomic  History   Marital status: Married    Spouse name: Sweet   Number of children: 0   Years of education: Not on file   Highest education level: Master's degree (e.g., MA, MS, MEng, MEd, MSW, MBA)  Occupational History   Not on file  Tobacco Use   Smoking status: Never    Passive exposure: Never   Smokeless tobacco: Never  Vaping Use   Vaping status: Never Used  Substance and Sexual Activity   Alcohol use: No   Drug use: No   Sexual activity: Not on file  Other Topics Concern   Not on file  Social History Narrative   Lives alone   Left handed   Caffeine: 2 ice tea a day   Social Drivers of Health   Financial Resource Strain: Low Risk  (02/22/2022)   Overall Financial Resource Strain (CARDIA)    Difficulty of Paying Living Expenses: Not hard at all  Food Insecurity: No Food Insecurity (08/09/2023)   Hunger Vital Sign    Worried About Running Out of Food in the Last Year: Never true    Ran Out of Food in the Last Year: Never true  Transportation Needs: No Transportation Needs (08/09/2023)   PRAPARE - Transportation  Lack of Transportation (Medical): No    Lack of Transportation (Non-Medical): No  Physical Activity: Not on file  Stress: No Stress Concern Present (02/22/2022)   [employer] of Occupational [employer] - Occupational Stress Questionnaire    Feeling of Stress : Only a little  Social Connections: Unknown (04/03/2022)   [facility]   Social Network    Social Network: Not on file  Intimate Partner Violence: Not At Risk (08/09/2023)   Humiliation, Afraid, Rape, and Kick questionnaire    Fear of Current or Ex-Partner: No    Emotionally Abused: No    Physically Abused: No    Sexually Abused: No    Family History  Problem Relation Age of Onset   Other Mother        Viral Meningitis   Mitral valve prolapse Mother    Arrhythmia Mother    Hypothyroidism Mother    Stroke Mother    Hypertension Father    Gout Father    Alzheimer's  disease Father     Current Outpatient Medications on File Prior to Visit  Medication Sig Dispense Refill   amLODipine  (NORVASC ) 5 MG tablet Take 5 mg by mouth daily.     Cholecalciferol  1000 units TBDP Take 1 tablet by mouth daily.      clopidogrel  (PLAVIX ) 75 MG tablet TAKE ONE TABLET BY MOUTH DAILY 90 tablet 1   dapagliflozin  propanediol (FARXIGA ) 10 MG TABS tablet Take 10 mg by mouth daily.     divalproex  (DEPAKOTE  ER) 500 MG 24 hr tablet Take 2 tablets (1,000 mg total) by mouth at bedtime. 60 tablet 5   DULoxetine  (CYMBALTA ) 60 MG capsule Take 2 capsules (120 mg total) by mouth daily. 60 capsule 5   febuxostat  (ULORIC ) 40 MG tablet Take 40 mg by mouth daily.     furosemide  (LASIX ) 20 MG tablet Take 20 mg by mouth every other day.     HYDROcodone -acetaminophen  (NORCO/VICODIN) 5-325 MG tablet Take 1 tablet by mouth every 6 (six) hours as needed for moderate pain. 20 tablet 0   KERENDIA 10 MG TABS Take 1 tablet by mouth daily.     lamoTRIgine  (LAMICTAL ) 200 MG tablet Take 1 tablet (200 mg total) by mouth every morning. 90 tablet 1   LevOCARNitine  (L-CARNITINE) 500 MG TABS Take 500 mg by mouth 2 (two) times daily.     magnesium  oxide (MAG-OX) 400 (240 Mg) MG tablet Take 400 mg by mouth daily.     multivitamin-lutein (OCUVITE-LUTEIN) CAPS capsule Take 1 capsule by mouth daily.     pramipexole  (MIRAPEX ) 0.5 MG tablet Take 1 tablet (0.5 mg total) by mouth 2 (two) times daily. 60 tablet 5   predniSONE  (DELTASONE ) 20 MG tablet 2 tabs po daily x 4 days 8 tablet 0   pregabalin  (LYRICA ) 100 MG capsule Take 1 capsule (100 mg total) by mouth 2 (two) times daily. 60 capsule 5   tamsulosin  (FLOMAX ) 0.4 MG CAPS capsule Take 0.4 mg by mouth daily.     VELTASSA 8.4 g packet Take 8.4 g by mouth See admin instructions. Take one packet by mouth three times a week. Days vary.     gabapentin  (NEURONTIN ) 400 MG capsule Take 400 mg by mouth 2 (two) times daily. Transitioning to Lyrica  (Patient not taking: Reported  on 11/25/2023)     rosuvastatin  (CRESTOR ) 40 MG tablet Take 1 tablet (40 mg total) by mouth daily. 90 tablet 3   No current facility-administered medications on file prior to  visit.    Allergies as of 11/25/2023 - Review Complete 11/25/2023  Allergen Reaction Noted   Allopurinol Other (See Comments) 11/15/2013   Penicillins Hives and Other (See Comments) 11/15/2013   Finerenone Other (See Comments) 02/04/2022   Penicillin g  02/16/2022   Aspirin Hives 09/20/2014   Ciprofloxacin Rash 11/15/2013     ROS:   General:  No weight loss, Fever, chills  HEENT: No recent headaches, no nasal bleeding, no visual changes, no sore throat  Neurologic: No dizziness, blackouts, seizures. No recent symptoms of stroke or mini- stroke. No recent episodes of slurred speech, or temporary blindness.  Cardiac: No recent episodes of chest pain/pressure, no shortness of breath at rest.  No shortness of breath with exertion.  Denies history of atrial fibrillation or irregular heartbeat  Vascular: No history of rest pain in feet.  No history of claudication.  No history of non-healing ulcer, No history of DVT   Pulmonary: No home oxygen , no productive cough, no hemoptysis,  No asthma or wheezing  Musculoskeletal:  [ ]  Arthritis, [ ]  Low back pain,  [ ]  Joint pain  Hematologic:No history of hypercoagulable state.  No history of easy bleeding.  No history of anemia  Gastrointestinal: No hematochezia or melena,  No gastroesophageal reflux, no trouble swallowing  Urinary: [x ] chronic Kidney disease, [ ]  on HD - [ ]  MWF or [ ]  TTHS, [ ]  Burning with urination, [ ]  Frequent urination, [ ]  Difficulty urinating;   Skin: No rashes  Psychological: positive history of anxiety,  positive history of depression  Physical Examination  Vitals:   11/25/23 1018  BP: 131/85  Pulse: 80  Resp: (!) 24  Temp: 99.1 F (37.3 C)  TempSrc: Temporal  SpO2: 91%  Weight: 269 lb (122 kg)  Height: 5' 10 (1.778 m)     Body mass index is 38.6 kg/m.  General:  Alert and oriented, no acute distress HEENT: Normal Neck: No bruit or JVD Pulmonary: Clear to auscultation bilaterally Cardiac: Regular Rate and Rhythm without murmur Abdomen: Soft, non-tender, non-distended, no mass, no scars Skin: No rash Extremity Pulses:  2+ radial,  femoral,  pulses bilaterally.  Doppler popliteal and PT left LE, Doppler PT/Peroneal and DP right LE brisk  Musculoskeletal: No deformity or edema  Neurologic: Upper and lower extremity motor 5/5 and symmetric  DATA:    +--------------+---------+------+-----------+------------+--------+  LEFT         Reflux NoRefluxReflux TimeDiameter cmsComments                          Yes                                   +--------------+---------+------+-----------+------------+--------+  CFV                    yes   >1 second                       +--------------+---------+------+-----------+------------+--------+  FV mid                  yes   >1 second                       +--------------+---------+------+-----------+------------+--------+  Popliteal              yes   >1 second                       +--------------+---------+------+-----------+------------+--------+  GSV at [facility]    no                            0.68              +--------------+---------+------+-----------+------------+--------+  GSV prox thighno                            0.55              +--------------+---------+------+-----------+------------+--------+  GSV mid thigh no                            0.42              +--------------+---------+------+-----------+------------+--------+  GSV dist thighno                            0.42              +--------------+---------+------+-----------+------------+--------+  GSV at knee   no                            0.43               +--------------+---------+------+-----------+------------+--------+  GSV prox calf no                            0.40              +--------------+---------+------+-----------+------------+--------+  SSV Pop Fossa no                            0.25              +--------------+---------+------+-----------+------------+--------+         Summary:  Left:  - No evidence of deep vein thrombosis from the common femoral through the  popliteal veins.  - No evidence of superficial venous thrombosis.  - The deep venous system demonstrates significant incompetence.  - The great and small saphenous veins are competent.   Assessment/Plan: Deep venous reflux CFV, FV and Popliteal vein No reflux noted in the GSV.  The Superficial system is competent.  No evidence of DVT.  No intervention is indicated he would benefit from conservative treatment.  Compression, elevation when at rest, water therapy if available.  He was measured for thigh high compression.  He has worn knee high compression for a few years.  He was measured today for new knee high compression.  He had a brisk PT on the left and absent peroneal and DP.  I will order f/u arterial duplex on the left with ABI's.  He is asymptomatic for arterial ischemia.    If he has recurrent cellulitis treat with antibiotics as needed.  Hopefully this will reduce the cellulitis in the future if he follows these recommendations to manage his weight and edema in the left LE.    Maurilio Deland Collet PA-C [facility] [place] Office: [phone]  MD in clinic [place]

## 2023-11-27 ENCOUNTER — Telehealth: Payer: Self-pay

## 2023-11-27 NOTE — Telephone Encounter (Signed)
 Pt called stating that he did miss his appt with allergy, but has since been rescheduled to 12/12/2023. We have rescheduled him to 12/13/2023 with Dr. Dea due to needing the results of his test.   He stated that he has only had a reaction as a child and would prefer to keep his visit sooner (like we had prev) if possible and stated that he has since been treated with Penicillin a few times w/out reactions.   Could you possibly advise if he just needs to keep his appt and see allergy first?

## 2023-11-27 NOTE — Telephone Encounter (Signed)
 I would keep the appointment with allergy and see me afterwards.

## 2023-11-28 DIAGNOSIS — E291 Testicular hypofunction: Secondary | ICD-10-CM | POA: Diagnosis not present

## 2023-12-04 DIAGNOSIS — N183 Chronic kidney disease, stage 3 unspecified: Secondary | ICD-10-CM | POA: Diagnosis not present

## 2023-12-05 ENCOUNTER — Ambulatory Visit: Payer: Medicare Other | Admitting: Infectious Diseases

## 2023-12-05 DIAGNOSIS — N183 Chronic kidney disease, stage 3 unspecified: Secondary | ICD-10-CM | POA: Diagnosis not present

## 2023-12-05 DIAGNOSIS — I129 Hypertensive chronic kidney disease with stage 1 through stage 4 chronic kidney disease, or unspecified chronic kidney disease: Secondary | ICD-10-CM | POA: Diagnosis not present

## 2023-12-05 DIAGNOSIS — E875 Hyperkalemia: Secondary | ICD-10-CM | POA: Diagnosis not present

## 2023-12-05 DIAGNOSIS — D631 Anemia in chronic kidney disease: Secondary | ICD-10-CM | POA: Diagnosis not present

## 2023-12-05 DIAGNOSIS — R809 Proteinuria, unspecified: Secondary | ICD-10-CM | POA: Diagnosis not present

## 2023-12-06 ENCOUNTER — Other Ambulatory Visit: Payer: Self-pay

## 2023-12-06 DIAGNOSIS — M7989 Other specified soft tissue disorders: Secondary | ICD-10-CM

## 2023-12-11 DIAGNOSIS — Z1331 Encounter for screening for depression: Secondary | ICD-10-CM | POA: Diagnosis not present

## 2023-12-11 DIAGNOSIS — Z1159 Encounter for screening for other viral diseases: Secondary | ICD-10-CM | POA: Diagnosis not present

## 2023-12-11 DIAGNOSIS — Z23 Encounter for immunization: Secondary | ICD-10-CM | POA: Diagnosis not present

## 2023-12-11 DIAGNOSIS — Z0000 Encounter for general adult medical examination without abnormal findings: Secondary | ICD-10-CM | POA: Diagnosis not present

## 2023-12-11 DIAGNOSIS — E782 Mixed hyperlipidemia: Secondary | ICD-10-CM | POA: Diagnosis not present

## 2023-12-11 DIAGNOSIS — I10 Essential (primary) hypertension: Secondary | ICD-10-CM | POA: Diagnosis not present

## 2023-12-11 DIAGNOSIS — E1122 Type 2 diabetes mellitus with diabetic chronic kidney disease: Secondary | ICD-10-CM | POA: Diagnosis not present

## 2023-12-11 DIAGNOSIS — E291 Testicular hypofunction: Secondary | ICD-10-CM | POA: Diagnosis not present

## 2023-12-12 ENCOUNTER — Other Ambulatory Visit: Payer: Self-pay

## 2023-12-12 ENCOUNTER — Ambulatory Visit: Payer: Medicare Other | Admitting: Allergy & Immunology

## 2023-12-12 ENCOUNTER — Telehealth: Payer: Self-pay

## 2023-12-12 ENCOUNTER — Encounter: Payer: Self-pay | Admitting: Allergy & Immunology

## 2023-12-12 VITALS — BP 134/84 | HR 74 | Temp 98.7°F | Resp 20 | Ht 67.5 in | Wt 262.6 lb

## 2023-12-12 DIAGNOSIS — Z880 Allergy status to penicillin: Secondary | ICD-10-CM | POA: Diagnosis not present

## 2023-12-12 NOTE — Progress Notes (Signed)
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 Penicillin allergy - I think your history is low risk, but we will schedule you for penicillin testing to be on the safe side.  - Make an appointment for the penicillin skin testing at the next visit.  - If this is negative, we will do a challenge in the office.   2. Chronic rhinitis and recurrent sinusitis - Because of insurance stipulations, we cannot do skin testing on the same day as your first visit. - We are all working to fight this, but for now we need to do two separate visits.  - We will know more after we do testing at the next visit.  - The skin testing visit can be squeezed in at your convenience.  - Then we can make a more full plan to address all of your symptoms. - Be sure to stop your antihistamines for 3 days before this appointment.   3. Return in about 1 week (around 12/19/2023). You can have the follow up appointment with Dr. Dellis [name] or a Nurse Practicioner (our Nurse Practitioners are excellent and always have Physician oversight!).    This note in its entirety was forwarded to the Provider who requested this consultation.  Subjective:   Edwin [name] is a 68 y.o. male presenting today for evaluation of  Chief Complaint  Patient presents with   Establish Care    Would like to know if penicillin can be taken. Had a reaction 50 years ago.    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   Allergic rhinitis 02/22/2022   Bipolar disorder (HCC) 02/22/2022   Body mass index (BMI) 37.0-37.9, adult 02/22/2022   Bradycardia 02/22/2022   Cat scratch of forearm 02/22/2022   Cerebral infarction (HCC) 02/22/2022   Closed fracture of metatarsal bone 02/22/2022   Constipation 02/22/2022   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   Dyslipidemia 02/22/2022   Edema 02/22/2022   Electrocardiogram abnormal 02/22/2022   Elevated PSA 02/22/2022   Enthesopathy 02/22/2022   Erectile dysfunction 02/22/2022   History of small bowel obstruction 02/22/2022   Hyperglycemia 02/22/2022   Hyperglycemia due to type 2 diabetes mellitus (HCC) 02/22/2022   Insomnia 02/22/2022   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   Impairment of balance 02/22/2022   Major depression, single episode 02/22/2022   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   Melena 02/22/2022   Mixed hyperlipidemia 02/22/2022   Obstructive sleep apnea syndrome 02/22/2022   Other long term (current) drug therapy 02/22/2022   Encounter for immunization 02/22/2022  Right lower quadrant pain 02/22/2022   Severe recurrent major depression without psychotic features (HCC) 02/22/2022   Umbilical hernia 02/22/2022   Vitamin D deficiency 02/22/2022   Left-sided weakness 02/04/2022   DM2 (diabetes mellitus, type 2) (HCC) 02/04/2022   CVA (cerebral vascular accident) (HCC) 02/04/2022   Enteritis of ileum 02/02/2017   Intestinal obstruction (HCC) 02/02/2017   Persistent recurrent vomiting  02/01/2017   Chronic kidney disease, stage 3 unspecified (HCC) 05/19/2016   Abdominal pain 05/19/2016   Hypokalemia 05/19/2016   Primary gout    Bipolar affective disorder, currently depressed, moderate (HCC) 09/20/2014   Essential hypertension 11/23/2013   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  Discussed the use of AI scribe software for clinical note transcription with the patient and/or guardian, who gave verbal consent to proceed.  Edwin [name] was referred by Edwin James, MD.     Edwin [name] is a 69 y.o. male presenting for an evaluation of a penicillin allergy .   He was diagnosed with a penicillin allergy at age 12.  The allergy was characterized by hives, which lasted for an unspecified duration. The patient does not recall any throat swelling or hospitalization due to the allergic reaction. He has not strictly avoided penicillin since the initial reaction and has not had any subsequent reactions.  Review of the chart shows that he has tolerated cephalosporins without a problem, including Keflex. He did not go to the hospital for his reaction when he was 17 years.   Interestingly, around the same time, he broke out from ASA from head to toe. He has avoided all aspirin since that time.  All of his reactions seemed to happen during his first semester of 10th grade. He estimates that he was fine after three months.    Allergic Rhinitis Symptom History: Sinuses have been congested for one month. He is not sure of the reasoning. He thinks he might have caught his wife's cold.  However, later in the visit, he does report that he has a chronic problem with congestion. In addition to the allergies, the patient has been dealing with sinus congestion for about a month, which he attributes to a cold caught from his spouse.  Food Allergy Symptom History: He is not avoiding any foods at this point. He does report a history of food allergies, which were  diagnosed around the same time as the penicillin allergy. He underwent skin testing and was [name] to be allergic to multiple foods. However, these foods were later reintroduced without any adverse reactions    Infection Symptom History: He is followed by Dr. Elinor Parkinson with infectious disease.  He last saw her in December 2024 for management of recurrent cellulitis.  Per the note, he was evaluated in the emergency room at the beginning of December for cellulitis.  He has history of recurrent cellulitis, always in the left leg.  This was the third episode for 2024.  He has been treated with IV vancomycin as well as IV dalbavancin.  He actually was treated with Keflex in July 2024 as well as IV cefepime in September 2024.  He has a penicillin allergy label, so he was sent here for de-labeling to allow more antibiotics to be utilized in the future.   The patient also has a history of vascular insufficiency, with ultrasound findings of impaired blood return in the leg and only one of three arteries feeding the leg being open. He has experienced swelling in the  leg but denies any pain, numbness, or tingling.  The patient has a history of depression, which he attributes to an undiagnosed case of arterial disease and a traumatic breakup. 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  The patient is currently on amlodipine and losartan for blood pressure management. He denies any history of smoking. 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   Otherwise, there is no history of other atopic diseases, including asthma, food allergies, stinging insect allergies, or contact dermatitis. There is no significant infectious history. Vaccinations are up to date.    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   Allergic rhinitis 02/22/2022   Bipolar disorder (HCC) 02/22/2022   Body mass index (BMI) 37.0-37.9, adult 02/22/2022   Bradycardia 02/22/2022   Cat scratch of forearm 02/22/2022   Cerebral infarction (HCC) 02/22/2022   Closed fracture of metatarsal bone 02/22/2022   Constipation 02/22/2022   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   Dyslipidemia 02/22/2022   Edema 02/22/2022   Electrocardiogram abnormal 02/22/2022   Elevated PSA 02/22/2022   Enthesopathy 02/22/2022   Erectile dysfunction 02/22/2022   History of small bowel obstruction 02/22/2022   Hyperglycemia 02/22/2022   Hyperglycemia due to type 2 diabetes mellitus (HCC) 02/22/2022   Insomnia 02/22/2022   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   Impairment of balance 02/22/2022   Major depression, single episode 02/22/2022   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   Melena 02/22/2022   Mixed hyperlipidemia 02/22/2022   Obstructive sleep apnea syndrome 02/22/2022   Other long term (current) drug therapy 02/22/2022   Encounter for immunization 02/22/2022   Right lower quadrant pain 02/22/2022   Severe recurrent major depression without psychotic features (HCC) 02/22/2022   Umbilical hernia 02/22/2022   Vitamin D deficiency 02/22/2022   Left-sided weakness 02/04/2022   DM2 (diabetes mellitus, type 2) (HCC) 02/04/2022   CVA (cerebral vascular accident) (HCC) 02/04/2022   Enteritis of ileum 02/02/2017   Intestinal obstruction (HCC)  02/02/2017   Persistent recurrent vomiting 02/01/2017   Chronic kidney disease, stage 3 unspecified (HCC) 05/19/2016   Abdominal pain 05/19/2016   Hypokalemia 05/19/2016   Primary gout    Bipolar affective disorder, currently depressed, moderate (HCC) 09/20/2014   Essential hypertension 11/23/2013   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 If you have any questions, ask your nurse or doctor.          amLODipine 5 MG tablet Commonly known as: NORVASC Take 5 mg by mouth daily.   Cholecalciferol 25 MCG (1000 UT) Tbdp Take 1 tablet by mouth daily.   clopidogrel 75 MG tablet Commonly known as: PLAVIX TAKE ONE TABLET BY MOUTH DAILY   dapagliflozin propanediol 10 MG Tabs tablet Commonly known as: FARXIGA Take 10 mg by mouth daily.   divalproex 500 MG 24 hr tablet Commonly known as: DEPAKOTE ER Take 2 tablets (1,000 mg total) by mouth at bedtime.   DULoxetine 60 MG capsule Commonly known as: CYMBALTA Take 2 capsules (120 mg total) by mouth daily.   febuxostat 40 MG tablet Commonly known as: ULORIC Take 40 mg by mouth daily.   furosemide 20 MG tablet Commonly known as: LASIX Take 20 mg by mouth every other day.   gabapentin 400 MG capsule Commonly known as: NEURONTIN Take 400 mg by mouth 2 (two) times daily. Transitioning to Lyrica   HYDROcodone-acetaminophen 5-325 MG tablet Commonly known as: NORCO/VICODIN Take 1 tablet by mouth every 6 (six) hours as needed for moderate pain.    ipratropium 0.06 % nasal spray Commonly known as: ATROVENT Place 2 sprays into both nostrils 3 (three) times daily.   Kerendia 10 MG Tabs Generic drug: Finerenone Take 1 tablet by mouth daily.   L-Carnitine 500 MG Tabs Take 500 mg by mouth 2 (two) times daily.   lamoTRIgine 200 MG tablet Commonly known as: LAMICTAL Take 1 tablet (200 mg total) by mouth every morning.   magnesium oxide 400 (240 Mg) MG tablet Commonly known as: MAG-OX Take 400 mg by mouth daily.   multivitamin-lutein Caps capsule Take 1 capsule by mouth daily.   pramipexole 0.5 MG tablet Commonly known as: MIRAPEX Take 1 tablet (0.5 mg total) by mouth 2 (two) times daily.   predniSONE 20 MG tablet Commonly known as: DELTASONE 2 tabs po daily x 4 days   pregabalin 100 MG capsule Commonly known as: Lyrica Take 1 capsule (100 mg total) by mouth 2 (two) times daily.   rosuvastatin 40 MG tablet Commonly known as: CRESTOR Take 1 tablet (40 mg total) by mouth daily.   sildenafil 100 MG tablet Commonly known as: VIAGRA Take 100 mg by mouth as needed.   tamsulosin 0.4 MG Caps capsule Commonly known as: FLOMAX Take 0.4 mg by mouth daily.   Veltassa 8.4 g packet Generic drug: patiromer Take 8.4 g by mouth See admin instructions. Take one packet by mouth three times a week. Days vary.        Birth History: non-contributory  Developmental History: non-contributory  Past Surgical History: Past Surgical History:  Procedure Laterality Date   APPENDECTOMY     CATARACT EXTRACTION Right    COLONOSCOPY WITH PROPOFOL N/A 06/14/2014   Procedure: COLONOSCOPY WITH PROPOFOL;  Surgeon: Charolett Bumpers, MD;  Location: [facility];  Service: Endoscopy;  Laterality: N/A;   TONSILLECTOMY     UMBILICAL HERNIA REPAIR       Family History: Family History  Problem Relation Age of Onset   Other Mother        Viral Meningitis   Mitral valve prolapse Mother    Arrhythmia Mother    Hypothyroidism Mother     Stroke Mother    Hypertension Father    Gout Father    Alzheimer's disease Father      Social History: Wah lives  at home with his wife who is 78 and from the [place]  They live in a house that was built in 1961.  There is hardwood flooring throughout the home.  They have gas heating and central cooling.  There are 3 cats inside of the home.  There are no dust mite covers on the bedding.  There is no tobacco exposure.  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  There is no fume, chemical, or dust exposure.  There is no tobacco exposure.   Review of systems otherwise negative other than that mentioned in the HPI.    Objective:   Blood pressure 134/84, pulse 74, temperature 98.7 F (37.1 C), temperature source Temporal, resp. rate 20, height 5' 7.5" (1.715 m), weight 262 lb 9.6 oz (119.1 kg), SpO2 97%. Body mass index is 40.52 kg/m.     Physical Exam Vitals reviewed.  Constitutional:      Appearance: He is well-developed.     Comments: Very talkative. Cooperative with the exam. Friendly.   HENT:     Head: Normocephalic and atraumatic.     Right Ear: Tympanic membrane, ear canal and external ear normal. No drainage, swelling or tenderness. Tympanic membrane is not injected, scarred, erythematous, retracted or bulging.     Left Ear: Tympanic membrane, ear canal and external ear normal. No drainage, swelling or tenderness. Tympanic membrane is not injected, scarred, erythematous, retracted or bulging.     Nose: No nasal deformity, septal deviation, mucosal edema or rhinorrhea.     Right Turbinates: Enlarged, swollen and pale.     Left Turbinates: Enlarged, swollen and pale.     Right Sinus: No maxillary sinus tenderness or frontal sinus tenderness.     Left Sinus: No maxillary sinus tenderness or frontal sinus tenderness.     Mouth/Throat:     Mouth: Mucous membranes are not pale and not dry.     Pharynx: Uvula midline.   Eyes:     General:        Right eye: No discharge.        Left eye: No discharge.     Conjunctiva/sclera: Conjunctivae normal.     Right eye: Right conjunctiva is not injected. No chemosis.    Left eye: Left conjunctiva is not injected. No chemosis.    Pupils: Pupils are equal, round, and reactive to light.  Cardiovascular:     Rate and Rhythm: Normal rate and regular rhythm.     Heart sounds: Normal heart sounds.  Pulmonary:     Effort: Pulmonary effort is normal. No tachypnea, accessory muscle usage or respiratory distress.     Breath sounds: Normal breath sounds. No wheezing, rhonchi or rales.  Chest:     Chest wall: No tenderness.  Abdominal:     Tenderness: There is no abdominal tenderness. There is no guarding or rebound.  Lymphadenopathy:     Head:     Right side of head: No submandibular, tonsillar or occipital adenopathy.     Left side of head: No submandibular, tonsillar or occipital adenopathy.     Cervical: No cervical adenopathy.  Skin:    Coloration: Skin is not pale.     Findings: No abrasion, erythema, petechiae or rash. Rash is not papular, urticarial or vesicular.  Neurological:     Mental Status: He is alert.  Psychiatric:        Behavior: Behavior is cooperative.      Diagnostic  studies: deferred due to insurance stipulations that require a separate visit for testing       Malachi Bonds, MD [facility]

## 2023-12-12 NOTE — Telephone Encounter (Signed)
Patient came in today 12/12/23 to try to speak to Dr. Elinor Parkinson about his appointment with Dr. Dellis Anes at [facility]. Was told he had a reaction to penicillin many years ago and is at low risk. Want's to go ahead and take the Penicillin shot. I did inform the patient we needed to reschedule his appointment with Dr. Elinor Parkinson on 12/13/23 she was going to be out of the office. Have reschedule with Dr. Renold Don for the first available appointment for 12/18/23 at 10:30am but would like the provider to give him a call at [phone] to discuss the visit he had today.

## 2023-12-12 NOTE — Patient Instructions (Addendum)
1. Penicillin allergy - I think your history is low risk, but we will schedule you for penicillin testing to be on the safe side.  - Make an appointment for the penicillin skin testing at the next visit.  - If this is negative, we will do a challenge in the office.   2. Chronic rhinitis and recurrent sinusitis - Because of insurance stipulations, we cannot do skin testing on the same day as your first visit. - We are all working to fight this, but for now we need to do two separate visits.  - We will know more after we do testing at the next visit.  - The skin testing visit can be squeezed in at your convenience.  - Then we can make a more full plan to address all of your symptoms. - Be sure to stop your antihistamines for 3 days before this appointment.   3. Return in about 1 week (around 12/19/2023). You can have the follow up appointment with Dr. Dellis Anes or a Nurse Practicioner (our Nurse Practitioners are excellent and always have Physician oversight!).    Please inform us of any Emergency Department visits, hospitalizations, or changes in symptoms. Call us before going to the ED for breathing or allergy symptoms since we might be able to fit you in for a sick visit. Feel free to contact us anytime with any questions, problems, or concerns.  It was a pleasure to meet you today!  Websites that have reliable patient information: 1. American Academy of Asthma, Allergy, and Immunology: www.aaaai.org 2. Food Allergy Research and Education (FARE): foodallergy.org 3. Mothers of Asthmatics: http://www.asthmacommunitynetwork.org 4. American College of Allergy, Asthma, and Immunology: www.acaai.org      "Like" [place] on Facebook and Instagram for our latest updates!      A healthy democracy works best when [employer] participate! Make sure you are registered to vote! If you have moved or changed any of your contact information, you will need to get this updated before voting! Scan the QR  codes below to learn more!

## 2023-12-13 ENCOUNTER — Ambulatory Visit: Payer: Medicare Other | Admitting: Infectious Diseases

## 2023-12-17 ENCOUNTER — Ambulatory Visit: Payer: Medicare Other | Admitting: Allergy & Immunology

## 2023-12-18 ENCOUNTER — Other Ambulatory Visit: Payer: Self-pay

## 2023-12-18 ENCOUNTER — Encounter: Payer: Self-pay | Admitting: Internal Medicine

## 2023-12-18 ENCOUNTER — Ambulatory Visit: Payer: Medicare Other | Admitting: Internal Medicine

## 2023-12-18 VITALS — BP 134/82 | HR 74 | Temp 98.2°F | Ht 70.0 in | Wt 262.0 lb

## 2023-12-18 DIAGNOSIS — N138 Other obstructive and reflux uropathy: Secondary | ICD-10-CM | POA: Diagnosis not present

## 2023-12-18 DIAGNOSIS — R338 Other retention of urine: Secondary | ICD-10-CM | POA: Diagnosis not present

## 2023-12-18 DIAGNOSIS — N529 Male erectile dysfunction, unspecified: Secondary | ICD-10-CM | POA: Diagnosis not present

## 2023-12-18 DIAGNOSIS — I878 Other specified disorders of veins: Secondary | ICD-10-CM

## 2023-12-18 DIAGNOSIS — R3915 Urgency of urination: Secondary | ICD-10-CM | POA: Diagnosis not present

## 2023-12-18 DIAGNOSIS — N401 Enlarged prostate with lower urinary tract symptoms: Secondary | ICD-10-CM | POA: Diagnosis not present

## 2023-12-18 DIAGNOSIS — R339 Retention of urine, unspecified: Secondary | ICD-10-CM | POA: Diagnosis not present

## 2023-12-18 MED ORDER — TRIAMCINOLONE ACETONIDE 0.5 % EX OINT: 1.0000 | TOPICAL_OINTMENT | Freq: Two times a day (BID) | CUTANEOUS | 0 refills | Status: DC

## 2023-12-18 NOTE — Patient Instructions (Signed)
You do not have cellulitis at this time   For patient with venous insuficiency (chronic leg swelling) dermatitis can occur (slight red in confined area of swelling) and you can use kenalog ointment twice a day when it happen  If fever, redness spreading out of swelling, then let us know

## 2023-12-18 NOTE — Progress Notes (Signed)
Patient Active Problem List   Diagnosis Date Noted   Penicillin allergy 11/06/2023   Leg swelling 11/06/2023   Medication management 11/05/2023   Cellulitis of left lower extremity 08/09/2023   Severe sepsis (HCC) 08/09/2023   Acute renal failure superimposed on stage 3b chronic kidney disease (HCC) 08/09/2023   Somnolence, daytime 05/30/2023   Nocturnal hypoxia 08/02/2022   Paresthesias 08/02/2022   Night-waking disorder with delayed sleep phase 08/02/2022   Pain in right ankle and joints of right foot 02/22/2022   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HCC) 02/22/2022   Allergic rhinitis 02/22/2022   Bipolar disorder (HCC) 02/22/2022   Body mass index (BMI) 37.0-37.9, adult 02/22/2022   Bradycardia 02/22/2022   Cat scratch of forearm 02/22/2022   Cerebral infarction (HCC) 02/22/2022   Closed fracture of metatarsal bone 02/22/2022   Constipation 02/22/2022   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   Dyslipidemia 02/22/2022   Edema 02/22/2022   Electrocardiogram abnormal 02/22/2022   Elevated PSA 02/22/2022   Enthesopathy 02/22/2022   Erectile dysfunction 02/22/2022   History of small bowel obstruction 02/22/2022   Hyperglycemia 02/22/2022   Hyperglycemia due to type 2 diabetes mellitus (HCC) 02/22/2022   Insomnia 02/22/2022   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   Impairment of balance 02/22/2022   Major depression, single episode 02/22/2022   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   Melena 02/22/2022   Mixed hyperlipidemia 02/22/2022   Obstructive sleep apnea syndrome 02/22/2022   Other long term (current) drug therapy 02/22/2022   Encounter for immunization 02/22/2022   Right lower quadrant pain 02/22/2022   Severe recurrent major depression without psychotic features (HCC) 02/22/2022   Umbilical hernia 02/22/2022   Vitamin D deficiency 02/22/2022   Left-sided weakness 02/04/2022   DM2 (diabetes mellitus, type 2) (HCC) 02/04/2022   CVA (cerebral  vascular accident) (HCC) 02/04/2022   Enteritis of ileum 02/02/2017   Intestinal obstruction (HCC) 02/02/2017   Persistent recurrent vomiting 02/01/2017   Chronic kidney disease, stage 3 unspecified (HCC) 05/19/2016   Abdominal pain 05/19/2016   Hypokalemia 05/19/2016   Primary gout    Bipolar affective disorder, currently depressed, moderate (HCC) 09/20/2014   Essential hypertension 11/23/2013   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   CHOLECALCIFEROL 1000 UNITS TBDP    Take 1 tablet by mouth daily.    CLOPIDOGREL (PLAVIX) 75 MG TABLET    TAKE ONE TABLET BY MOUTH DAILY   DAPAGLIFLOZIN PROPANEDIOL (FARXIGA) 10 MG TABS TABLET    Take 10 mg by mouth daily.   DIVALPROEX (DEPAKOTE ER) 500 MG 24 HR TABLET    Take 2 tablets (1,000 mg total) by mouth at bedtime.   DULOXETINE (CYMBALTA) 60 MG CAPSULE    Take 2 capsules (120 mg total) by mouth daily.   FEBUXOSTAT (ULORIC) 40 MG TABLET    Take 40 mg by mouth daily.   FUROSEMIDE (LASIX) 20 MG TABLET    Take 20 mg by mouth every other day.   IPRATROPIUM (ATROVENT) 0.06 % NASAL SPRAY    Place 2 sprays into both nostrils 3 (three) times daily.   KERENDIA 10 MG TABS    Take 1 tablet by mouth daily.   LAMOTRIGINE (LAMICTAL) 200 MG TABLET    Take 1 tablet (200 mg total) by mouth every morning.   LEVOCARNITINE (L-CARNITINE) 500 MG  TABS    Take 500 mg by mouth 2 (two) times daily.   MAGNESIUM OXIDE (MAG-OX) 400 (240 MG) MG TABLET    Take 400 mg by mouth daily.   MULTIVITAMIN-LUTEIN (OCUVITE-LUTEIN) CAPS CAPSULE    Take 1 capsule by mouth daily.   PRAMIPEXOLE (MIRAPEX) 0.5 MG TABLET    Take 1 tablet (0.5 mg total) by mouth 2 (two) times daily.   PREGABALIN (LYRICA) 100 MG CAPSULE    Take 1 capsule (100 mg total) by mouth 2 (two) times daily.   ROSUVASTATIN (CRESTOR) 40 MG TABLET    Take 1 tablet (40 mg total) by  mouth daily.   SILDENAFIL (VIAGRA) 100 MG TABLET    Take 100 mg by mouth as needed.   TAMSULOSIN (FLOMAX) 0.4 MG CAPS CAPSULE    Take 0.4 mg by mouth daily.   VELTASSA 8.4 G PACKET    Take 8.4 g by mouth See admin instructions. Take one packet by mouth three times a week. Days vary.  Modified Medications   No medications on file  Discontinued Medications   GABAPENTIN (NEURONTIN) 400 MG CAPSULE    Take 400 mg by mouth 2 (two) times daily. Transitioning to Lyrica   HYDROCODONE-ACETAMINOPHEN (NORCO/VICODIN) 5-325 MG TABLET    Take 1 tablet by mouth every 6 (six) hours as needed for moderate pain.   PREDNISONE (DELTASONE) 20 MG TABLET    2 tabs po daily x 4 days    Subjective: Discussed the use of AI scribe software for clinical note transcription with the patient, who gave verbal consent to proceed.   68 Y O male with PMH of DM2, HTN, CKD, Gout, obesity, CVA who is referred from ED after recent visit for cellulitis. He is here for f/u  Reviewed chart -- last seen dr Elinor Parkinson 12/17 Other problems venous stasis and penicillin allergy Tolerated cephalexin in the past   He is being seen by allergy who will schedule pcn allergy testing soon  Previously 10/24/23 ed visit for cellulitis --> treated with IV oritavancin   Dr Elinor Parkinson also refer him to vascular for evaluation of insufficiency; no plan to do prophylaxis at that time    Per front staff communication patient showed up 12/12/23 and wants to start penicillin shot and asked to be seen     Review of Systems: All systems reviewed including MSK negative  Past Medical History:  Diagnosis Date   Allergic rhinitis    Angio-edema    Bipolar disorder (HCC)    Bradycardia 2012    due to lithium   CKD (chronic kidney disease) stage 2, GFR 60-89 ml/min    DM2 (diabetes mellitus, type 2) (HCC)    Gout    Hypertension    Mild renal insufficiency    , with creatinine of 1.3 in 2010, was side effect of med   Obesity    ,moderate    Prostatitis    , Episodic   Stroke (HCC)    Suicide attempt (HCC) 09/20/2014   Urticaria    Past Surgical History:  Procedure Laterality Date   APPENDECTOMY     CATARACT EXTRACTION Right    COLONOSCOPY WITH PROPOFOL N/A 06/14/2014   Procedure: COLONOSCOPY WITH PROPOFOL;  Surgeon: Charolett Bumpers, MD;  Location: [facility];  Service: Endoscopy;  Laterality: N/A;   TONSILLECTOMY     UMBILICAL HERNIA REPAIR      Social History   Tobacco Use   Smoking status: Never    Passive exposure: Never  Smokeless tobacco: Never  Vaping Use   Vaping status: Never Used  Substance Use Topics   Alcohol use: No   Drug use: No    Family History  Problem Relation Age of Onset   Other Mother        Viral Meningitis   Mitral valve prolapse Mother    Arrhythmia Mother    Hypothyroidism Mother    Stroke Mother    Hypertension Father    Gout Father    Alzheimer's disease Father     Allergies  Allergen Reactions   Allopurinol Other (See Comments)    Made pt emotionally unstable  Other reaction(s): emotionally labile   Penicillins Hives and Other (See Comments)   Finerenone Other (See Comments)    hyperkalemia Other reaction(s): Increases  potassium levels   Penicillin G     Other reaction(s): hives   Aspirin Hives   Ciprofloxacin Rash    Other reaction(s): rash    Health Maintenance  Topic Date Due   Medicare Annual Wellness (AWV)  Never done   Pneumonia Vaccine 55+ Years old (1 of 2 - PCV) Never done   FOOT EXAM  Never done   OPHTHALMOLOGY EXAM  Never done   Diabetic kidney evaluation - Urine ACR  Never done   Hepatitis C Screening  Never done   Zoster Vaccines- Shingrix (2 of 2) 12/08/2019   COVID-19 Vaccine (3 - Pfizer risk series) 03/23/2020   INFLUENZA VACCINE  06/20/2023   DTaP/Tdap/Td (2 - Td or Tdap) 10/04/2023   HEMOGLOBIN A1C  11/30/2023   Colonoscopy  06/14/2024   Diabetic kidney evaluation - eGFR measurement  10/23/2024   HIV Screening  Completed   HPV  VACCINES  Aged Out    Objective: BP 134/82   Pulse 74   Temp 98.2 F (36.8 C) (Temporal)   Ht 5\' 10"  (1.778 m)   Wt 262 lb (118.8 kg)   BMI 37.59 kg/m    Physical Exam Constitutional:      Appearance: Normal appearance.  Morbidly obese HENT:     Head: Normocephalic and atraumatic.      Mouth: Mucous membranes are moist.  Eyes:    Conjunctiva/sclera: Conjunctivae normal.     Pupils: Pupils are equal, round, and bilaterally symmetrical   Cardiovascular:     Rate and Rhythm: Normal rate and regular rhythm.     Heart sounds: s1s2  Pulmonary:     Effort: Pulmonary effort is normal.     Breath sounds: Normal breath sounds.   Abdominal:     General: Non distended     Palpations: soft. Non tender, BS+   Skin: no warmth/erythema/tenderness    Lab Results Lab Results  Component Value Date   WBC 9.8 10/24/2023   HGB 12.9 (L) 10/24/2023   HCT 39.3 10/24/2023   MCV 92.3 10/24/2023   PLT 174 10/24/2023    Lab Results  Component Value Date   CREATININE 1.63 (H) 10/24/2023   BUN 43 (H) 10/24/2023   NA 141 10/24/2023   K 4.4 10/24/2023   CL 108 10/24/2023   CO2 23 10/24/2023    Lab Results  Component Value Date   ALT 23 10/24/2023   AST 19 10/24/2023   ALKPHOS 87 10/24/2023   BILITOT 0.4 10/24/2023    Lab Results  Component Value Date   CHOL 160 07/01/2022   HDL 46 07/01/2022   LDLCALC 105 (H) 07/01/2022   TRIG 45 07/01/2022   CHOLHDL 3.5 07/01/2022   No  results found for: "LABRPR", "RPRTITER" No results found for: "HIV1RNAQUANT", "HIV1RNAVL", "CD4TABS"   Assessment/plan 36 Y O male with PMH of DM2 ( A1c 5.3), HTN, CKD, Gout, obesity, CVA who is referred from ED after recent visit for recurrent cellulitis.    # left leg cellulitis  - s/p oritavancin 12/5, resolved  - no further need for abtx currently. Defer chronic antibiotic prophylaxis for now  - Unclear cause for recurrent cellulitis, referred to Vascular for r/o any vascular disease   # Left  leg swelling  - venous US of left leg to r/o DVT - no signs and symptoms or PE  # Penicillin allergy  - reports tolerating cephalexin in the past without any issues  - Referral to allergy   # Morbid Obesity - will benefit from weight loss  ---------------------- 12/18/23 id clinic assessment Pending allergy testing but low suspicion for pcn allergy No sign of cellulitis today Previous eval no dvt Vascular surgery had seen patient and suspect venous insufficiency per his report  Discuss cellulitis vs venous stasis dermatitis how to distinguish  F/u as needed for true cellulitis    Raymondo Band, MD [facility] 12/18/2023, 10:25 AM

## 2023-12-25 DIAGNOSIS — E291 Testicular hypofunction: Secondary | ICD-10-CM | POA: Diagnosis not present

## 2023-12-27 DIAGNOSIS — R972 Elevated prostate specific antigen [PSA]: Secondary | ICD-10-CM | POA: Diagnosis not present

## 2023-12-30 ENCOUNTER — Ambulatory Visit (HOSPITAL_COMMUNITY)
Admission: RE | Admit: 2023-12-30 | Discharge: 2023-12-30 | Disposition: A | Discharge status: Home / Self Care | Payer: Medicare Other | Source: Ambulatory Visit | Attending: Surgery | Admitting: Surgery

## 2023-12-30 ENCOUNTER — Ambulatory Visit (INDEPENDENT_AMBULATORY_CARE_PROVIDER_SITE_OTHER)
Admission: RE | Admit: 2023-12-30 | Discharge: 2023-12-30 | Disposition: A | Discharge status: Home / Self Care | Payer: Medicare Other | Source: Ambulatory Visit | Attending: Surgery | Admitting: Surgery

## 2023-12-30 DIAGNOSIS — M7989 Other specified soft tissue disorders: Secondary | ICD-10-CM | POA: Diagnosis not present

## 2023-12-30 DIAGNOSIS — M79605 Pain in left leg: Secondary | ICD-10-CM | POA: Insufficient documentation

## 2023-12-30 LAB — VAS US ABI WITH/WO TBI
Left ABI: 1.04
Right ABI: 1.01

## 2024-01-01 DIAGNOSIS — N183 Chronic kidney disease, stage 3 unspecified: Secondary | ICD-10-CM | POA: Diagnosis not present

## 2024-01-02 ENCOUNTER — Ambulatory Visit: Payer: Medicare Other | Admitting: Physician Assistant

## 2024-01-02 VITALS — BP 139/77 | HR 73 | Temp 98.0°F | Ht 70.0 in | Wt 268.1 lb

## 2024-01-02 DIAGNOSIS — I872 Venous insufficiency (chronic) (peripheral): Secondary | ICD-10-CM | POA: Diagnosis not present

## 2024-01-02 NOTE — Progress Notes (Signed)
Office Note     CC:  follow up Requesting Provider:  Deatra James, MD  HPI: Edwin [name] is a 68 y.o. (15-Feb-1956) male who presents for follow up with non invasive studies. He was recently seen in our office for evaluation of LLE swelling. He has history of recurrent cellulitis. On duplex he had significant deep reflux but no superficial venous insufficiency. Recommendations for conservative therapy of venous reflux were discussed with him and he was fitted for knee high compression. On exam he was noted to have diminished pulses so non invasive evaluation was recommended. He does not have any rest pain, claudication or tissue loss. He has been wearing his knee high compression and trying to elevate since his last visit. He reports minimal improvement in his swelling. He is medically managed on Plavix and Statin.   Past Medical History:  Diagnosis Date   Allergic rhinitis    Angio-edema    Bipolar disorder (HCC)    Bradycardia 2012    due to lithium   CKD (chronic kidney disease) stage 2, GFR 60-89 ml/min    DM2 (diabetes mellitus, type 2) (HCC)    Gout    Hypertension    Mild renal insufficiency    , with creatinine of 1.3 in 2010, was side effect of med   Obesity    ,moderate   Prostatitis    , Episodic   Stroke [facility])    Suicide attempt (HCC) 09/20/2014   Urticaria     Past Surgical History:  Procedure Laterality Date   APPENDECTOMY     CATARACT EXTRACTION Right    COLONOSCOPY WITH PROPOFOL N/A 06/14/2014   Procedure: COLONOSCOPY WITH PROPOFOL;  Surgeon: Charolett Bumpers, MD;  Location: [facility];  Service: Endoscopy;  Laterality: N/A;   TONSILLECTOMY     UMBILICAL HERNIA REPAIR      Social History   Socioeconomic History   Marital status: Married    Spouse name: Sweet   Number of children: 0   Years of education: Not on file   Highest education level: Master's degree (e.g., MA, MS, MEng, MEd, MSW, MBA)  Occupational History   Not on file  Tobacco Use    Smoking status: Never    Passive exposure: Never   Smokeless tobacco: Never  Vaping Use   Vaping status: Never Used  Substance and Sexual Activity   Alcohol use: No   Drug use: No   Sexual activity: Not on file  Other Topics Concern   Not on file  Social History Narrative   Lives alone   Left handed   Caffeine: 2 ice tea a day   Social Drivers of Health   Financial Resource Strain: Low Risk  (02/22/2022)   Overall Financial Resource Strain (CARDIA)    Difficulty of Paying Living Expenses: Not hard at all  Food Insecurity: No Food Insecurity (08/09/2023)   Hunger Vital Sign    Worried About Running Out of Food in the Last Year: Never true    Ran Out of Food in the Last Year: Never true  Transportation Needs: No Transportation Needs (08/09/2023)   PRAPARE - Administrator, Civil Service (Medical): No    Lack of Transportation (Non-Medical): No  Physical Activity: Not on file  Stress: No Stress Concern Present (02/22/2022)   [employer] of Occupational Health - Occupational Stress Questionnaire    Feeling of Stress : Only a little  Social Connections: Unknown (04/03/2022)   Received from [facility]   Social Network    Social Network: Not on file  Intimate Partner Violence: Not At Risk (08/09/2023)   Humiliation, Afraid, Rape, and Kick questionnaire    Fear of Current or Ex-Partner: No    Emotionally Abused: No    Physically Abused: No    Sexually Abused: No    Family History  Problem Relation Age of Onset   Other Mother        Viral Meningitis   Mitral valve prolapse Mother    Arrhythmia Mother    Hypothyroidism Mother    Stroke Mother    Hypertension Father    Gout Father    Alzheimer's disease Father     Current Outpatient Medications  Medication Sig Dispense Refill   amLODipine (NORVASC) 5 MG tablet Take 5 mg by mouth daily.     Cholecalciferol 1000 units TBDP Take 1 tablet by mouth daily.      clopidogrel (PLAVIX) 75 MG tablet  TAKE ONE TABLET BY MOUTH DAILY 90 tablet 1   dapagliflozin propanediol (FARXIGA) 10 MG TABS tablet Take 10 mg by mouth daily.     divalproex (DEPAKOTE ER) 500 MG 24 hr tablet Take 2 tablets (1,000 mg total) by mouth at bedtime. 60 tablet 5   DULoxetine (CYMBALTA) 60 MG capsule Take 2 capsules (120 mg total) by mouth daily. 60 capsule 5   febuxostat (ULORIC) 40 MG tablet Take 40 mg by mouth daily.     furosemide (LASIX) 20 MG tablet Take 20 mg by mouth every other day.     ipratropium (ATROVENT) 0.06 % nasal spray Place 2 sprays into both nostrils 3 (three) times daily.     KERENDIA 10 MG TABS Take 1 tablet by mouth daily. (Patient not taking: Reported on 12/18/2023)     lamoTRIgine (LAMICTAL) 200 MG tablet Take 1 tablet (200 mg total) by mouth every morning. 90 tablet 1   LevOCARNitine (L-CARNITINE) 500 MG TABS Take 500 mg by mouth 2 (two) times daily.     magnesium oxide (MAG-OX) 400 (240 Mg) MG tablet Take 400 mg by mouth daily.     multivitamin-lutein (OCUVITE-LUTEIN) CAPS capsule Take 1 capsule by mouth daily.     pramipexole (MIRAPEX) 0.5 MG tablet Take 1 tablet (0.5 mg total) by mouth 2 (two) times daily. 60 tablet 5   pregabalin (LYRICA) 100 MG capsule Take 1 capsule (100 mg total) by mouth 2 (two) times daily. 60 capsule 5   rosuvastatin (CRESTOR) 40 MG tablet Take 1 tablet (40 mg total) by mouth daily. 90 tablet 3   sildenafil (VIAGRA) 100 MG tablet Take 100 mg by mouth as needed.     tamsulosin (FLOMAX) 0.4 MG CAPS capsule Take 0.4 mg by mouth daily.     triamcinolone ointment (KENALOG) 0.5 % Apply 1 Application topically 2 (two) times daily. 30 g 0   VELTASSA 8.4 g packet Take 8.4 g by mouth See admin instructions. Take one packet by mouth three times a week. Days vary. (Patient not taking: Reported on 12/18/2023)     No current facility-administered medications for this visit.    Allergies  Allergen Reactions   Allopurinol Other (See Comments)    Made pt emotionally unstable   Other reaction(s): emotionally labile   Penicillins Hives and Other (See Comments)   Finerenone Other (See Comments)    hyperkalemia Other reaction(s): Increases  potassium levels   Penicillin G     Other reaction(s): hives   Aspirin Hives   Ciprofloxacin  Rash    Other reaction(s): rash     REVIEW OF SYSTEMS:  [X]  denotes positive finding, [ ]  denotes negative finding Cardiac  Comments:  Chest pain or chest pressure:    Shortness of breath upon exertion:    Short of breath when lying flat:    Irregular heart rhythm:        Vascular    Pain in calf, thigh, or hip brought on by ambulation:    Pain in feet at night that wakes you up from your sleep:     Blood clot in your veins:    Leg swelling:         Pulmonary    Oxygen at home:    Productive cough:     Wheezing:         Neurologic    Sudden weakness in arms or legs:     Sudden numbness in arms or legs:     Sudden onset of difficulty speaking or slurred speech:    Temporary loss of vision in one eye:     Problems with dizziness:         Gastrointestinal    Blood in stool:     Vomited blood:         Genitourinary    Burning when urinating:     Blood in urine:        Psychiatric    Major depression:         Hematologic    Bleeding problems:    Problems with blood clotting too easily:        Skin    Rashes or ulcers:        Constitutional    Fever or chills:      PHYSICAL EXAMINATION:  Vitals:   01/02/24 0855  BP: 139/77  Pulse: 73  Temp: 98 F (36.7 C)  SpO2: 93%  Weight: 268 lb 1.6 oz (121.6 kg)  Height: 5\' 10"  (1.778 m)    General:  WDWN in NAD; vital signs documented above Gait: Normal HENT: WNL, normocephalic Pulmonary: normal non-labored breathing Cardiac: regular HR Abdomen: soft, NT, no masses Vascular Exam/Pulses: 2+ right DP/PT, 2+ left PT, Left DP not palpable, feet warm and well perfused. Left lower extremity edematous throughout, mild edema of RLE Extremities: without  ischemic changes, without Gangrene , without cellulitis; without open wounds;  Musculoskeletal: no muscle wasting or atrophy  Neurologic: A&O X 3 Psychiatric:  The pt has Normal affect.   Non-Invasive Vascular Imaging:   +-------+-----------+-----------+------------+------------+  ABI/TBIToday's ABIToday's TBIPrevious ABIPrevious TBI  +-------+-----------+-----------+------------+------------+  Right 1.01       0.69                                 +-------+-----------+-----------+------------+------------+  Left  1.04       0.79                                 +-------+-----------+-----------+------------+------------+    VAS US arterial LLE: +-----------+--------+-----+--------+---------+--------+  LEFT      PSV cm/sRatioStenosisWaveform Comments  +-----------+--------+-----+--------+---------+--------+  CFA Distal 96                   triphasic          +-----------+--------+-----+--------+---------+--------+  DFA       68  triphasic          +-----------+--------+-----+--------+---------+--------+  SFA Prox   86                   triphasic          +-----------+--------+-----+--------+---------+--------+  SFA Mid    116                  triphasic          +-----------+--------+-----+--------+---------+--------+  SFA Distal 105                  triphasic          +-----------+--------+-----+--------+---------+--------+  POP Prox   60                   triphasic          +-----------+--------+-----+--------+---------+--------+  POP Distal 71                   triphasic          +-----------+--------+-----+--------+---------+--------+  ATA Distal 85                   triphasic          +-----------+--------+-----+--------+---------+--------+  PTA Distal 64                   triphasic          +-----------+--------+-----+--------+---------+--------+  PERO Distal151                   triphasic          +-----------+--------+-----+--------+---------+--------+    Summary:  Left:   - Patent femoral-popliteal arteries with no evidence of hemodynamically significant stenosis.  - Patent 3-vessel runoff with multiphasic waveforms.   ASSESSMENT/PLAN:: 68 y.o. male here for follow up for evaluation of PAD. Based on his non invasive studies he has very mild arterial disease. He is without any rest pain, claudication or tissue loss. He does have deep venous reflux that was identified on duplex at the time of his last visit. Conservative management of this was discussed at that time. Re discussed venous insufficiency and the progression of it. Provided reassurance regarding his arterial and venous disease and that he is not at risk for limb loss. -  I have encouraged walking regimen to help with both his venous reflux and arterial disease. - Continue Statin and Plavix - Continue elevation and compression as well as weight management and refraining from prolonged sitting and/ or standing - He will follow up with [place] as needed if he has any new or concerning symptoms.    Graceann Congress, PA-C [facility] [phone]  Clinic MD:  Karin Lieu

## 2024-01-08 DIAGNOSIS — E291 Testicular hypofunction: Secondary | ICD-10-CM | POA: Diagnosis not present

## 2024-01-10 DIAGNOSIS — H26491 Other secondary cataract, right eye: Secondary | ICD-10-CM | POA: Diagnosis not present

## 2024-01-15 DIAGNOSIS — R972 Elevated prostate specific antigen [PSA]: Secondary | ICD-10-CM | POA: Diagnosis not present

## 2024-01-15 DIAGNOSIS — N4289 Other specified disorders of prostate: Secondary | ICD-10-CM | POA: Diagnosis not present

## 2024-01-17 ENCOUNTER — Encounter: Payer: Self-pay | Admitting: Adult Health

## 2024-01-17 DIAGNOSIS — N183 Chronic kidney disease, stage 3 unspecified: Secondary | ICD-10-CM | POA: Diagnosis not present

## 2024-01-20 DIAGNOSIS — H26491 Other secondary cataract, right eye: Secondary | ICD-10-CM | POA: Diagnosis not present

## 2024-01-20 NOTE — Telephone Encounter (Signed)
 Please let patient know that we can put him on the list for sooner appointment with either me or Dr. Pearlean Brownie

## 2024-01-20 NOTE — Telephone Encounter (Signed)
 Please advise patient that we we will place him on a wait list to try to get him in sooner to discuss medication options.  If he wants we can try reducing the dose of Lyrica.

## 2024-01-23 DIAGNOSIS — N529 Male erectile dysfunction, unspecified: Secondary | ICD-10-CM | POA: Diagnosis not present

## 2024-01-23 DIAGNOSIS — R972 Elevated prostate specific antigen [PSA]: Secondary | ICD-10-CM | POA: Diagnosis not present

## 2024-01-23 DIAGNOSIS — N138 Other obstructive and reflux uropathy: Secondary | ICD-10-CM | POA: Diagnosis not present

## 2024-01-23 DIAGNOSIS — N401 Enlarged prostate with lower urinary tract symptoms: Secondary | ICD-10-CM | POA: Diagnosis not present

## 2024-01-29 ENCOUNTER — Ambulatory Visit: Payer: Medicare Other | Admitting: Psychiatry

## 2024-01-29 ENCOUNTER — Encounter: Payer: Self-pay | Admitting: Psychiatry

## 2024-01-29 DIAGNOSIS — F3181 Bipolar II disorder: Secondary | ICD-10-CM | POA: Diagnosis not present

## 2024-01-29 DIAGNOSIS — F4310 Post-traumatic stress disorder, unspecified: Secondary | ICD-10-CM

## 2024-01-29 DIAGNOSIS — G4733 Obstructive sleep apnea (adult) (pediatric): Secondary | ICD-10-CM | POA: Diagnosis not present

## 2024-01-29 DIAGNOSIS — R748 Abnormal levels of other serum enzymes: Secondary | ICD-10-CM

## 2024-01-29 NOTE — Progress Notes (Signed)
 Edwin [name] [id number] 1956-07-28 68 y.o.  Subjective:   Patient ID:  Edwin [name] is a 68 y.o. (DOB [birth year]) male.  Chief Complaint:  Chief Complaint  Patient presents with   Follow-up   Depression   Anxiety   Stress    Edwin [name] presents to the office today for follow-up of bipolar depression and anxiety.  seen July 02, 2019.  No meds were changed.  Patient was doing well.  He asked me to write a letter to his attorney and support his intention to marry a woman from overseas and that his mental health was sufficiently well that he would not represent a danger to his fiance.  This was discussed with his attorney and the letter was written.  Met girl June 8. Met her on dating site international.  [place] woman and decided wanted to get married.  Options opening up after being shut down by Covid.  seen November 2020.  No meds were changed.  He was overall doing well.  seen January 20, 2020 .  No med changed.  Following noted. Wonders about elevated liver enzymes and Depakote.  Labs not visible in Epic.  PCP Dr. Zenovia Jordan is working up this finding.   Doing great from mental health.  Talks daily to GF in [place].    03/23/20 the following is noted:    Taken on too many students.  Recognizes needs to have 2 days off a week and plans to schedule that.  Needs to get through end of the semester.  Fiance visa is difficult but she just got admitted to [facility] pending some exams being passed.  Visa difficult with Covid affecting travel.  She could be here in mid-July. No med changes  07/06/20 with the following noted: Still good. Pramipexole has kept depression at bay. Mood is still stable. Good interest in basketball. Still concerned about weight and blames meds and wonders about weight loss meds and supplements.  Asked about L-glutamine taken it for a week.  Trying to watch diet.  Hasn't weighed himself. Plan: No med changes indicated yet.  Sig risk in switching his  primary mood stabilizer unless its absolutely nececessary.  10/05/20 appt noted: Consistent with meds.  Still doing great overall.  Some trouble sleeping and got help with CPAP with Dr. Earl Gala.  Tendency to sleep 3 hours and awakened.  May stay awake 1-2 hours and eventually get sufficient sleep.  Can awaken refreshed. No SE except weight gain.   Got denied for Ozempic.  Very concerned about weight gain with Depakote. Still facetiming with GF in [place] and no visa for her yet bc country still shut down. Plan no med changes  01/05/2021 appointment with the following noted: Expressed concerns about weight and new dx DM.   Disc retrying efforts to get Ozempic.   Has increase glucose.   Been on phone with GF in [place] for 20 mos.  It's dragging on trying to get her here.  Thinking about going there.   Previously lost the love of his life Edwin [name] his illness.  Sense of loss bc her birthday was a couple of days ago.    05/03/2021 appointment with the following noted: Doing as well as anyone can be doing.  Flew to [place] in April.  People were nice.  Enjoyed the family and went with 27 people to the beach.  GF comes from family of 9.  Going back in July and wedding July 30.  Had Face Timed for  22 mos and gotten to know the family.  She has not been to [place] yet but they plan to live here.  Spousal visas are back logged.   Has seen PCP and dietician and is losing weight. Plan: No med changes indicated yet.  Sig risk in switchin g his primary mood stabilizer unless its absolutely nececessary. Failed attempt to reduce pramipexole below 0.5 mg BID  08/30/21 appt noted: Pretty well.  Got married but she's not here yet.  Working on getting her into the [place].  Has a good immigration attorney here.  Usually takes a just a couple of mos but maybe longer  Edwin [name] backlog.   Still taking meds Depakote 1000, duloxetine 120, lamotrigine 200, pramipexole 0.5 mg BID.   Stopped soda.  Working with nutritionist and  down 35#. 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   Disc psychology and masculinity goals. No SE.   Satisfied with meds and no indication to change. Plan: continue Depakote 1000, duloxetine 120, lamotrigine 200, pramipexole 0.5 mg BID.   Failed attempt to reduce pramipexole below 0.5 mg BID  01/01/22 appt noted: Going to [employer].   Very good mental health.   New wife is delayed getting here bc slow immigration.  She's in [place] right now.   Got married in [place]   OK with meds. CKD stable. Plan: No med changes indicated yet.  Sig risk in switching his primary mood stabilizer unless its absolutely nececessary. Depakote 1000, duloxetine 120, lamotrigine 200, pramipexole 0.5 mg BID.    05/01/2022 appointment with the following noted: March 15 dizzy and dx with stroke.  Thinks it might be related to untreated OSA bc CPAP machine burned up and couldn't get another.  O2 levels low in hospital in sleep. Appt  to see sleep doc soon.  Sleeping with O2 now.   No depression occurred.  Handled it well.  Lost 52# this year. Thankful for meds.   Sees Dr. Richardean Chimera 7/6 Applied for expedited Visa for wife. Plan: No med changes indicated.  Sig risk in switching his primary mood stabilizer unless its absolutely nececessary. Depakote 1000, duloxetine 120, lamotrigine 200, pramipexole 0.5 mg BID.  Gabapentin 400 BID Failed attempt to reduce pramipexole below 0.5 mg BID  09/17/22 appt noted: 06/30/22 ED stroke  then had rehab. Stroke in March mild but not again in 8/23.  Slowly better. Walking 2-4 miles daily and it has helped. Enjoys [employer] sports Patient reports stable mood and denies depressed or irritable moods.   Patient denies any recent difficulty with anxiety.  Patient denies difficulty with sleep initiation or maintenance.  Not Using CPAP some issues getting O2.. Sleep hygiene is better now and that' s helped.  Going to bed earlier.   Denies appetite disturbance.   Patient reports that energy and motivation have been good.  Patient denies any difficulty with concentration.  Patient denies any suicidal ideation. Mood good wihtout swings or depression.   Satisfied with meds. No alcohol.   Plan no med changes from above  01/21/23 appt noted: Continues meds as above.  Tolerating meds except constipation.  Miralax helping. Been doing good but youngest cat died and he is grieving.  It was his favorite pet.  Didn't have to put her down.  Happened 2 mos ago.  His cat was Louie.  He was a happy cat.   No mood swings.   Been going to [facility] basketball games.   Visa process is going slow to  get sig other (wife) here from [place]. Taking a long time.   No mood swings.    05/27/23 appt noted: Newly married and wife here for 2 days.  4 yr process.  W Sweet.   Enjoys teaching and playing piano.   Playing some at [employer]. Continue meds. No new SE.  ? Wt gain related. Patient reports stable mood and denies depressed or irritable moods.  Patient denies any recent difficulty with anxiety.  Patient denies difficulty with sleep initiation or maintenance. Denies appetite disturbance.  Patient reports that energy and motivation have been good.  Patient denies any difficulty with concentration.  Patient denies any suicidal ideation. Has seen neuro and on gabapentin and Lyrica.   Still some PT as needed after the stroke.  Watching BP Hard losing another cat in May which mother named.   Plan: No med changes indicated.  Sig risk in switching his primary mood stabilizer unless its absolutely nececessary. Depakote 1000, duloxetine 120, lamotrigine 200, pramipexole 0.5 mg BID.  Still on gabapentin 400 mg BID  09/30/23 appt noted: Health issues since here with cellulitis, sepsis, fall, sciatica.   Lost cat of 17 years.  Got new cats. Married life is an adjustment.  Going well so far.   Switching from gabapentin to Lyrica for sciatica and paresthesia hand and  foot. Still playing piano.   No SE with meds unless wt gain.   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 Mood has remained stable.   Able to get by with less sleep than in the past.  Getting 4-5 hours.  Can schedule nap if needed. Rejuvinating.  To sleep quickly.  No insomnia.   No dep .  No swings.  01/29/24 appt noted: Med: Depakote 1000, duloxetine 120, lamotrigine 200, pramipexole 0.5 mg BID.  Switch gabapentin 400 mg BID to Lyrica 75 mg BID for paresthesia.  Helps some.  SE gabapentin sleepy Mood pretty much the same.   Some stressor.  Has no contact with sister since his mother died.  Unexpected conflict with student's father out of the blue for no reason none.  Resulting in meaningless, unfounded charge of cyber-stalking.  Had to shut down business for 2 weeks the charge came from an a former student who is an Pensions consultant.  Triggered some PTSD for him.   Overall satisfied with meds.  M died July 25, 2019.  Was a relief for her.    Past Psychiatric Medication Trials: Lithium witch to VPA 2012 Edwin [name] increased Cr  Depakote 1000, lamotrigine,  gabapentin, Vraylar tremor, Rexulti SE, Abilify increased weight,   Wellbutrin NR, phentermine,   Pramipexole 0.5 mg BID L-Carnitine  Psych hosp 1986 helpful   Review of Systems:  Review of Systems  Constitutional:  Negative for unexpected weight change.  Cardiovascular:  Negative for palpitations.  Musculoskeletal:  Positive for arthralgias and back pain.  Neurological:  Positive for weakness. Negative for tremors.       Paresthesia in hand  Psychiatric/Behavioral:  Negative for agitation, behavioral problems, confusion, decreased concentration, hallucinations, self-injury, sleep disturbance and suicidal ideas. The patient is not hyperactive.   No longer depressed.   Medications: I have reviewed the patient's current medications.  Current Outpatient Medications  Medication Sig Dispense Refill   amLODipine (NORVASC) 5 MG  tablet Take 5 mg by mouth daily.     Cholecalciferol 1000 units TBDP Take 1 tablet by mouth daily.      clopidogrel (PLAVIX) 75 MG tablet TAKE ONE  TABLET BY MOUTH DAILY 90 tablet 1   dapagliflozin propanediol (FARXIGA) 10 MG TABS tablet Take 10 mg by mouth daily.     divalproex (DEPAKOTE ER) 500 MG 24 hr tablet Take 2 tablets (1,000 mg total) by mouth at bedtime. 60 tablet 5   DULoxetine (CYMBALTA) 60 MG capsule Take 2 capsules (120 mg total) by mouth daily. 60 capsule 5   febuxostat (ULORIC) 40 MG tablet Take 40 mg by mouth daily.     furosemide (LASIX) 20 MG tablet Take 20 mg by mouth every other day.     ipratropium (ATROVENT) 0.06 % nasal spray Place 2 sprays into both nostrils 3 (three) times daily.     KERENDIA 10 MG TABS Take 1 tablet by mouth daily.     lamoTRIgine (LAMICTAL) 200 MG tablet Take 1 tablet (200 mg total) by mouth every morning. 90 tablet 1   LevOCARNitine (L-CARNITINE) 500 MG TABS Take 500 mg by mouth 2 (two) times daily.     magnesium oxide (MAG-OX) 400 (240 Mg) MG tablet Take 400 mg by mouth daily.     multivitamin-lutein (OCUVITE-LUTEIN) CAPS capsule Take 1 capsule by mouth daily.     pramipexole (MIRAPEX) 0.5 MG tablet Take 1 tablet (0.5 mg total) by mouth 2 (two) times daily. 60 tablet 5   pregabalin (LYRICA) 100 MG capsule Take 1 capsule (100 mg total) by mouth 2 (two) times daily. 60 capsule 5   sildenafil (VIAGRA) 100 MG tablet Take 100 mg by mouth as needed.     tamsulosin (FLOMAX) 0.4 MG CAPS capsule Take 0.4 mg by mouth daily.     triamcinolone ointment (KENALOG) 0.5 % Apply 1 Application topically 2 (two) times daily. 30 g 0   VELTASSA 8.4 g packet Take 8.4 g by mouth See admin instructions. Take one packet by mouth three times a week. Days vary.     rosuvastatin (CRESTOR) 40 MG tablet Take 1 tablet (40 mg total) by mouth daily. 90 tablet 3   No current facility-administered medications for this visit.    Medication Side Effects: Other: weight  gain.  Allergies:  Allergies  Allergen Reactions   Allopurinol Other (See Comments)    Made pt emotionally unstable  Other reaction(s): emotionally labile   Penicillins Hives and Other (See Comments)   Finerenone Other (See Comments)    hyperkalemia Other reaction(s): Increases  potassium levels   Penicillin G     Other reaction(s): hives   Aspirin Hives   Ciprofloxacin Rash    Other reaction(s): rash    Past Medical History:  Diagnosis Date   Allergic rhinitis    Angio-edema    Bipolar disorder (HCC)    Bradycardia 2012    due to lithium   CKD (chronic kidney disease) stage 2, GFR 60-89 ml/min    DM2 (diabetes mellitus, type 2) (HCC)    Gout    Hypertension    Mild renal insufficiency    , with creatinine of 1.3 in 2010, was side effect of med   Obesity    ,moderate   Prostatitis    , Episodic   Stroke (HCC)    Suicide attempt (HCC) 09/20/2014   Urticaria     Family History  Problem Relation Age of Onset   Other Mother        Viral Meningitis   Mitral valve prolapse Mother    Arrhythmia Mother    Hypothyroidism Mother    Stroke Mother    Hypertension Father  Gout Father    Alzheimer's disease Father     Social History   Socioeconomic History   Marital status: Married    Spouse name: Sweet   Number of children: 0   Years of education: Not on file   Highest education level: Master's degree (e.g., MA, MS, MEng, MEd, MSW, MBA)  Occupational History   Not on file  Tobacco Use   Smoking status: Never    Passive exposure: Never   Smokeless tobacco: Never  Vaping Use   Vaping status: Never Used  Substance and Sexual Activity   Alcohol use: No   Drug use: No   Sexual activity: Not on file  Other Topics Concern   Not on file  Social History Narrative   Lives alone   Left handed   Caffeine: 2 ice tea a day   Social Drivers of Health   Financial Resource Strain: Low Risk  (02/22/2022)   Overall Financial Resource Strain (CARDIA)    Difficulty  of Paying Living Expenses: Not hard at all  Food Insecurity: No Food Insecurity (08/09/2023)   Hunger Vital Sign    Worried About Running Out of Food in the Last Year: Never true    Ran Out of Food in the Last Year: Never true  Transportation Needs: No Transportation Needs (08/09/2023)   PRAPARE - Administrator, Civil Service (Medical): No    Lack of Transportation (Non-Medical): No  Physical Activity: Not on file  Stress: No Stress Concern Present (02/22/2022)   [employer] of Occupational Health - Occupational Stress Questionnaire    Feeling of Stress : Only a little  Social Connections: Unknown (04/03/2022)   [facility] from [facility]   Social Network    Social Network: Not on file  Intimate Partner Violence: Not At Risk (08/09/2023)   Humiliation, Afraid, Rape, and Kick questionnaire    Fear of Current or Ex-Partner: No    Emotionally Abused: No    Physically Abused: No    Sexually Abused: No    Past Medical History, Surgical history, Social history, and Family history were reviewed and updated as appropriate.   Please see review of systems for further details on the patient's review from today.   Objective:   Physical Exam:  There were no vitals taken for this visit.  Physical Exam Constitutional:      General: He is not in acute distress.    Appearance: He is well-developed.  Musculoskeletal:        General: No deformity.  Neurological:     Mental Status: He is alert and oriented to person, place, and time.     Motor: No tremor.     Coordination: Coordination normal.     Gait: Gait normal.  Psychiatric:        Attention and Perception: Attention normal. He is attentive.        Mood and Affect: Mood is not anxious or depressed. Affect is not labile, blunt or inappropriate.        Speech: Speech is not rapid and pressured.        Behavior: Behavior normal.        Thought Content: Thought content normal. Thought content is not  delusional. Thought content does not include homicidal or suicidal ideation. Thought content does not include suicidal plan.        Cognition and Memory: Cognition normal.        Judgment: Judgment normal.     Comments: Insight  is good.  Some situational anxiety managed Talkative and pleasant. Recent sig stressors.      Lab Review:     Component Value Date/Time   NA 141 10/24/2023 0258   K 4.4 10/24/2023 0258   CL 108 10/24/2023 0258   CO2 23 10/24/2023 0258   GLUCOSE 160 (H) 10/24/2023 0258   BUN 43 (H) 10/24/2023 0258   CREATININE 1.63 (H) 10/24/2023 0258   CALCIUM 9.1 10/24/2023 0258   PROT 7.1 10/24/2023 0258   ALBUMIN 3.9 10/24/2023 0258   AST 19 10/24/2023 0258   ALT 23 10/24/2023 0258   ALKPHOS 87 10/24/2023 0258   BILITOT 0.4 10/24/2023 0258   GFRNONAA 46 (L) 10/24/2023 0258   GFRAA >60 02/04/2017 0525       Component Value Date/Time   WBC 9.8 10/24/2023 0258   RBC 4.26 10/24/2023 0258   HGB 12.9 (L) 10/24/2023 0258   HCT 39.3 10/24/2023 0258   PLT 174 10/24/2023 0258   MCV 92.3 10/24/2023 0258   MCH 30.3 10/24/2023 0258   MCHC 32.8 10/24/2023 0258   RDW 14.2 10/24/2023 0258   LYMPHSABS 1.3 10/24/2023 0258   MONOABS 0.7 10/24/2023 0258   EOSABS 0.2 10/24/2023 0258   BASOSABS 0.1 10/24/2023 0258    No results found for: "POCLITH", "LITHIUM"   Lab Results  Component Value Date   VALPROATE 35 (L) 07/01/2022    Sleep study dx severe OSA AHI 53.   .res Assessment: Plan:    Sameer was seen today for follow-up, depression, anxiety and stress.  Diagnoses and all orders for this visit:  Bipolar II disorder (HCC)  PTSD (post-traumatic stress disorder)  Obstructive sleep apnea  Elevated liver enzymes   30 min face to face time with patient was spent on counseling and coordination of care. We discussed  Mood remains stable . Switch from lithium to VPA in 2012 was followed up with CMP which was normal at the time except for elevated ammonia level.   Which was the reason for adding L-carnitine and this corrected the ammonia level.   Elevated liver enzymes resolved.  Having some other health issues.    Recent stressor over the customer who falsely accused him.  Good response to the med combination.  Polypharmacy necessary.   Since 2017 on pramipexole has been much better with regard to mood stability.  He feels it causes weight gain but cannot maintain stability bc of it.  Concerned about elevated CR and the reasons for it.   Using O2 consistently HS and he requires less sleep.  Disc sleep hygiene and getting adequate quantity for mental health.  Sleep deprivation increases risk mania.   No med changes indicated.   Sig risk in switching his primary mood stabilizer unless its absolutely nececessary. Depakote 1000, duloxetine 120, lamotrigine 200, pramipexole 0.5 mg BID.  Still on gabapentin 400 mg BID  Failed attempt to reduce pramipexole below 0.5 mg BID Disc risk impulsivity and mania with this but he's had depression benefit without problems.  Disc MCR plans and gave info.  Counseling 30 min:  dealing with student's parent with some unexpected conflict.  Counting the cost of his response to being falsely accused with unknown reasons.   Elevated liver enzymes resolved.  FU 4 mos  Meredith Staggers, MD, DFAPA   Please see After Visit Summary for patient specific instructions.  Future Appointments  Date Time Provider Department Center  03/20/2024 10:00 AM Butch Penny, NP [facility] None     No  orders of the defined types were placed in this encounter.      -------------------------------

## 2024-01-30 DIAGNOSIS — M170 Bilateral primary osteoarthritis of knee: Secondary | ICD-10-CM | POA: Diagnosis not present

## 2024-01-30 DIAGNOSIS — M1712 Unilateral primary osteoarthritis, left knee: Secondary | ICD-10-CM | POA: Diagnosis not present

## 2024-01-30 DIAGNOSIS — M1711 Unilateral primary osteoarthritis, right knee: Secondary | ICD-10-CM | POA: Diagnosis not present

## 2024-02-04 ENCOUNTER — Other Ambulatory Visit: Payer: Self-pay | Admitting: Adult Health

## 2024-02-06 ENCOUNTER — Telehealth: Payer: Self-pay | Admitting: *Deleted

## 2024-02-06 NOTE — Telephone Encounter (Signed)
 Spoke to [place] at [facility] about pregabalin.  Pt was in for something else and is asking about taking a lower dose.  He last picked up the 100mg  po bid 01-13-2024.  (He took this dose Dec, Jan, Feb). No asking about lowering dose.  I dont see a message relating reason.  I LMVM for pt to call us (about why he wants the loser dose).

## 2024-02-10 NOTE — Telephone Encounter (Signed)
 We are trying to get pt in sooner with Dr Pearlean Brownie or Aundra Millet NP.

## 2024-02-10 NOTE — Telephone Encounter (Signed)
 Addressed further in [employer].

## 2024-02-12 ENCOUNTER — Other Ambulatory Visit: Payer: Self-pay | Admitting: *Deleted

## 2024-02-20 ENCOUNTER — Ambulatory Visit: Admitting: Neurology

## 2024-02-20 ENCOUNTER — Encounter: Payer: Self-pay | Admitting: Neurology

## 2024-02-20 VITALS — BP 138/86 | HR 79 | Ht 70.0 in | Wt 254.0 lb

## 2024-02-20 DIAGNOSIS — R202 Paresthesia of skin: Secondary | ICD-10-CM | POA: Diagnosis not present

## 2024-02-20 DIAGNOSIS — G5601 Carpal tunnel syndrome, right upper limb: Secondary | ICD-10-CM | POA: Diagnosis not present

## 2024-02-20 DIAGNOSIS — Z8673 Personal history of transient ischemic attack (TIA), and cerebral infarction without residual deficits: Secondary | ICD-10-CM | POA: Diagnosis not present

## 2024-02-20 DIAGNOSIS — M1009 Idiopathic gout, multiple sites: Secondary | ICD-10-CM | POA: Diagnosis not present

## 2024-02-20 MED ORDER — PREGABALIN 100 MG PO CAPS: 100.0000 mg | ORAL_CAPSULE | Freq: Two times a day (BID) | ORAL | 3 refills | Status: DC

## 2024-02-20 NOTE — Patient Instructions (Addendum)
 had a long d/w patient about his remote medullary stroke, poststroke paresthesia, risk for recurrent stroke/TIAs, personally independently reviewed imaging studies and stroke evaluation results and answered questions.Continue Plavix 75 mg daily for secondary stroke prevention and maintain strict control of hypertension with blood pressure goal below 130/90, diabetes with hemoglobin A1c goal below 6.5% and lipids with LDL cholesterol goal below 70 mg/dL. I also advised the patient to eat a healthy diet with plenty of whole grains, cereals, fruits and vegetables, exercise regularly and maintain ideal body weight .patient continues to have refractory paresthesias which have not responded to Topamax and gabapentin trials.  Recommend increase Lyrica to 100 mg twice daily.  Check EMG nerve conduction study for carpal tunnel and advised the patient to limit activities which involve rapid repetitive wrist flexion and to wear a wrist extension splint.  Check screening carotid ultrasound, lipid profile and hemoglobin A1c.  Return for follow-up in 6 months with medical nurse practitioner call earlier if necessary.

## 2024-02-20 NOTE — Progress Notes (Signed)
 PATIENT: Edwin [name] DOB: September 05, 1956  REASON FOR VISIT: follow up HISTORY FROM: patient PRIMARY NEUROLOGIST: Edwin [name]  Chief Complaint  Patient presents with   Follow-up    Patient in room #2 and alone.Patient states he been feeling a lot more tingling in both hands and fingers.    HISTORY OF PRESENT ILLNESS: Update/01/2024.  Patient is referred back today for evaluation by her primary physician for numbness in his hands.  Patient had complained of postop paresthesias with mostly left hand numbness at last visit.  Edwin [name] had recommended reducing and stopping the gabapentin and increasing the Lyrica.  Patient is on Lyrica 100 mg daily which helped somewhat but he is concerned that for the last few months he has noticed intermittent paresthesias in his right hand as well.  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  She states the paresthesias are present throughout the day.  They do not necessarily wake him up from sleep.  He has not been wearing any wrist splints.  He is tolerating Lyrica well without side effects.  He denies any recurrent stroke or TIA symptoms.  He remains on Plavix she is tolerating well without bleeding or bruising.  He is tolerating Crestor well without muscle aches and pains.  He is not sure when he had his last lipid profile checked.  His blood pressure is under good control. UPDATE 10/15/2024Aundra Millet, NP ):   He returns for follow-up after last visit with Edwin Other, NP 5 months ago.  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  Please try to increase the gabapentin to 400 mg 3 times daily but made him drowsy during the day of surgery back down to twice daily.  If it does help but he has breakthrough symptoms.  Continues to have mild balance difficulties and leans to the left at times but has not had any falls or injuries.  Remains on Plavix which is tolerating well without significant bleeding or  bruising.  His blood pressure and cholesterol under good control.  He does have obstructive sleep apnea and sees Edwin [name] but he has not been able to tolerate CPAP mask.  He is using the dental device instead and feels it is helping him.  He has not had any recurrent stroke or TIA symptoms.   Prior visit 11/29/2022 Edwin Other, NP )Edwin [name] is a 68 year old male with a history of stroke with residual paresthesias in the left upper extremity.  He returns today for follow-up.  He is currently on gabapentin taking 400 mg twice a day. Tingling down the arm comes and goes. Reports tingling in the back of the left leg as well. Doesn't notice when he is busy but bothersome at [name] times.  Symptoms were not there prior to his stroke but has remained since history.  He returns today for evaluation   06/20/22: Edwin [name] is a 68 year old male with a history of stroke with residual paresthesia.  He returns today for follow-up. Patient Edwin [name] to the ED on July 15 with left arm and leg numbness.  The patient had residual numbness in the left arm and leg from his prior stroke but felt that it is gotten worse.  Work-up in the ED was relatively unremarkable.  He was started on Topamax for paresthesias but feels that this made it worse.  He returns today to discuss another medication to try. He describes it  has pins and needles sensation in the left arm and leg- not as much numbness. He teaches piano.   Reports that he does feel that is balance is off. Tends to veer to the left when ambulating. Feels like he is going to fall but fortunately has not had any falls.  Reports that when he was completing physical therapy he did find it beneficial.  Would like to have another round of physical therapy.  03/22/22: Edwin [name] is a 68 year old male who presented to the ED on March 19 with left-sided sensory deficit and difficulty with ambulation.  He did not receive TNKase due to being out of the window.  Stroke: Small acute infarct  in the Right Medullary Pyramid likely due to small vessel disease CT head - 02/02/2022 - No acute intracranial process. CT head - 02/04/2022 - No acute intracranial process.  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  Continue Plavix on discharge  Patient reports that he is doing well. Continues to have some tingling on the left side of the body.  Feels it down the arm and leg. Did not complete any therapy. Reports that he had blood work through PCP and reports that LDL is better and is no longer Crestor. States that it caused fatigue and muscle pain.   Reports that he is monitoring diet and sees a nutritionist  OSA: was using it but then went on a trip and the machine adaptor was broken and was not able to get a replacement CPAP. Was seeing Edwin [name].  States that he struggled using the CPAP.  Reports that he would wake up every hour.  Reports that he tried different mask with no benefit.  Reports that he is interested in inspire but Edwin [name] does not do this.  He has not seen Edwin [name] in the last year.  He feels that his last sleep study was in 2021 he is requesting to see our sleep physicians here to see if he is a candidate for possible inspire device   REVIEW OF SYSTEMS: Out of a complete 14 system review of symptoms, the patient complains only of the following symptoms, and all [name] reviewed systems are negative.  ALLERGIES: Allergies  Allergen Reactions   Allopurinol [name] (See Comments)    Made pt emotionally unstable  [name] reaction(s): emotionally labile   Penicillins Hives and [name] (See Comments)   Finerenone [name] (See Comments)     hyperkalemia [name] reaction(s): Increases  potassium levels   Penicillin G     [name] reaction(s): hives   Aspirin Hives   Ciprofloxacin Rash    [name] reaction(s): rash    HOME MEDICATIONS: Outpatient Medications Prior to Visit  Medication Sig Dispense Refill   amLODipine (NORVASC) 5 MG tablet Take 5 mg by mouth daily.     Cholecalciferol 1000 units TBDP Take 1 tablet by mouth daily.      clopidogrel (PLAVIX) 75 MG tablet TAKE ONE TABLET BY MOUTH DAILY 90 tablet 1   dapagliflozin propanediol (FARXIGA) 10 MG TABS tablet Take 10 mg by mouth daily.     divalproex (DEPAKOTE ER) 500 MG 24  hr tablet Take 2 tablets (1,000 mg total) by mouth at bedtime. 60 tablet 5   DULoxetine (CYMBALTA) 60 MG capsule Take 2 capsules (120 mg total) by mouth daily. 60 capsule 5   febuxostat (ULORIC) 40 MG tablet Take 40 mg by mouth daily.     furosemide (LASIX) 20 MG tablet Take 20 mg by mouth every [name] day.     ipratropium (ATROVENT) 0.06 % nasal spray Place 2 sprays into both nostrils 3 (three) times daily.     KERENDIA 10 MG TABS Take 1 tablet by mouth daily.     lamoTRIgine (LAMICTAL) 200 MG tablet Take 1 tablet (200 mg total) by mouth every morning. 90 tablet 1   LevOCARNitine (L-CARNITINE) 500 MG TABS Take 500 mg by mouth 2 (two) times daily.     losartan (COZAAR) 50 MG tablet Take 50 mg by mouth daily.     magnesium oxide (MAG-OX) 400 (240 Mg) MG tablet Take 400 mg by mouth daily.     multivitamin-lutein (OCUVITE-LUTEIN) CAPS capsule Take 1 capsule by mouth daily.     pramipexole (MIRAPEX) 0.5 MG tablet Take 1 tablet (0.5 mg total) by mouth 2 (two) times daily. 60 tablet 5   sildenafil (VIAGRA) 100 MG tablet Take 100 mg by mouth as needed.     sodium zirconium cyclosilicate (LOKELMA) 10 g PACK packet Take 10 g by mouth.     tamsulosin (FLOMAX) 0.4 MG CAPS capsule Take 0.4 mg by mouth daily.     triamcinolone ointment (KENALOG) 0.5 % Apply 1 Application topically 2 (two) times daily. 30 g 0    VELTASSA 8.4 g packet Take 8.4 g by mouth See admin instructions. Take one packet by mouth three times a week. Days vary.     pregabalin (LYRICA) 75 MG capsule Take 75 mg by mouth 2 (two) times daily.     rosuvastatin (CRESTOR) 40 MG tablet Take 1 tablet (40 mg total) by mouth daily. 90 tablet 3   No facility-administered medications prior to visit.    PAST MEDICAL HISTORY: Past Medical History:  Diagnosis Date   Allergic rhinitis    Angio-edema    Bipolar disorder (HCC)    Bradycardia 2012    due to lithium   CKD (chronic kidney disease) stage 2, GFR 60-89 ml/min    DM2 (diabetes mellitus, type 2) (HCC)    Gout    Hypertension    Mild renal insufficiency    , with creatinine of 1.3 in 2010, was side effect of med   Obesity    ,moderate   Prostatitis    , Episodic   Stroke (HCC)    Suicide attempt (HCC) 09/20/2014   Urticaria     PAST SURGICAL HISTORY: Past Surgical History:  Procedure Laterality Date   APPENDECTOMY     CATARACT EXTRACTION Right    COLONOSCOPY WITH PROPOFOL N/A 06/14/2014   Procedure: COLONOSCOPY WITH PROPOFOL;  Surgeon: Charolett Bumpers, MD;  Location: [facility];  Service: Endoscopy;  Laterality: N/A;   TONSILLECTOMY     UMBILICAL HERNIA REPAIR      FAMILY HISTORY: Family History  Problem Relation Age of Onset   [name] Mother        Viral Meningitis   Mitral valve prolapse Mother    Arrhythmia Mother    Hypothyroidism Mother    Stroke Mother    Hypertension Father    Gout Father    Alzheimer's disease Father     SOCIAL HISTORY: Social History  Socioeconomic History   Marital status: Married    Spouse name: Sweet   Number of children: 0   Years of education: Not on file   Highest education level: Master's degree (e.g., MA, MS, MEng, MEd, MSW, MBA)  Occupational History   Not on file  Tobacco Use   Smoking status: Never    Passive exposure: Never   Smokeless tobacco: Never  Vaping Use   Vaping status: Never Used  Substance and  Sexual Activity   Alcohol use: No   Drug use: No   Sexual activity: Not on file  [name] Topics Concern   Not on file  Social History Narrative   Lives alone   Left handed   Caffeine: 2 ice tea a day   Social Drivers of Health   Financial Resource Strain: Low Risk  (02/22/2022)   Overall Financial Resource Strain (CARDIA)    Difficulty of Paying Living Expenses: Not hard at all  Food Insecurity: No Food Insecurity (08/09/2023)   Hunger Vital Sign    Worried About Running Out of Food in the Last Year: Never true    Ran Out of Food in the Last Year: Never true  Transportation Needs: No Transportation Needs (08/09/2023)   PRAPARE - Administrator, Civil Service (Medical): No    Lack of Transportation (Non-Medical): No  Physical Activity: Not on file  Stress: No Stress Concern Present (02/22/2022)   [employer] of Occupational Health - Occupational Stress Questionnaire    Feeling of Stress : Only a little  Social Connections: Unknown (04/03/2022)   Received from [facility]   Social Network    Social Network: Not on file  Intimate Partner Violence: Not At Risk (08/09/2023)   Humiliation, Afraid, Rape, and Kick questionnaire    Fear of Current or Ex-Partner: No    Emotionally Abused: No    Physically Abused: No    Sexually Abused: No      PHYSICAL EXAM  Vitals:   02/20/24 0749  BP: 138/86  Pulse: 79  Weight: 254 lb (115.2 kg)  Height: 5\' 10"  (1.778 m)    Body mass index is 36.45 kg/m.  Generalized: Obese middle-age Caucasian male in no acute distress   Neurological examination  Mentation: Alert oriented to time, place, history taking. Follows all commands speech and language fluent Cranial nerve II-XII: Pupils were equal round reactive to light. Extraocular movements were full, visual field were full on confrontational test. Facial sensation and strength were normal.  Head turning and shoulder shrug  were normal and symmetric. Motor: The  motor testing reveals 5 over 5 strength of all 4 extremities. Good symmetric motor tone is noted throughout.  Diminished fine finger movements on the left.  Orbits right over left upper extremity. Sensory: Sensory testing is intact to soft touch on all 4 extremities. No evidence of extinction is noted.  Negative Tinel sign over both wrist. Coordination: Cerebellar testing reveals good finger-nose-finger and heel-to-shin bilaterally.  Gait and station: Gait is slow and cautious and slightly broad-based.  Unsteady on either foot unsupported and narrow-based..  Tandem gait not attempted.     DIAGNOSTIC DATA (LABS, IMAGING, TESTING) - I reviewed patient records, labs, notes, testing and imaging myself where available.  Lab Results  Component Value Date   WBC 9.8 10/24/2023   HGB 12.9 (L) 10/24/2023   HCT 39.3 10/24/2023   MCV 92.3 10/24/2023   PLT 174 10/24/2023      Component Value Date/Time  NA 141 10/24/2023 0258   K 4.4 10/24/2023 0258   CL 108 10/24/2023 0258   CO2 23 10/24/2023 0258   GLUCOSE 160 (H) 10/24/2023 0258   BUN 43 (H) 10/24/2023 0258   CREATININE 1.63 (H) 10/24/2023 0258   CALCIUM 9.1 10/24/2023 0258   PROT 7.1 10/24/2023 0258   ALBUMIN 3.9 10/24/2023 0258   AST 19 10/24/2023 0258   ALT 23 10/24/2023 0258   ALKPHOS 87 10/24/2023 0258   BILITOT 0.4 10/24/2023 0258   GFRNONAA 46 (L) 10/24/2023 0258   GFRAA >60 02/04/2017 0525   Lab Results  Component Value Date   CHOL 160 07/01/2022   HDL 46 07/01/2022   LDLCALC 105 (H) 07/01/2022   TRIG 45 07/01/2022   CHOLHDL 3.5 07/01/2022   Lab Results  Component Value Date   HGBA1C 5.3 05/30/2023       ASSESSMENT AND PLAN 68 y.o. year old male  has a past medical history of Allergic rhinitis, Angio-edema, Bipolar disorder (HCC), Bradycardia (2012), CKD (chronic kidney disease) stage 2, GFR 60-89 ml/min, DM2 (diabetes mellitus, type 2) (HCC), Gout, Hypertension, Mild renal insufficiency, Obesity, Prostatitis,  Stroke (HCC), Suicide attempt (HCC) (09/20/2014), and Urticaria.  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  Vascular risk factors of obesity, hypertension, diabetes, hyperlipidemia and a obstructive sleep apnea.  Doing well except for persistent poststroke paresthesias with now new right hand paresthesias raising concern for carpal tunnel.  I had a long d/w patient about his remote medullary stroke, poststroke paresthesia, risk for recurrent stroke/TIAs, personally independently reviewed imaging studies and stroke evaluation results and answered questions.Continue Plavix 75 mg daily for secondary stroke prevention and maintain strict control of hypertension with blood pressure goal below 130/90, diabetes with hemoglobin A1c goal below 6.5% and lipids with LDL cholesterol goal below 70 mg/dL. I also advised the patient to eat a healthy diet with plenty of whole grains, cereals, fruits and vegetables, exercise regularly and maintain ideal body weight .patient continues to have refractory paresthesias which have not responded to Topamax and gabapentin trials.  Recommend increase Lyrica to 100 mg twice daily.  Check EMG nerve conduction study for carpal tunnel and advised the patient to limit activities which involve rapid repetitive wrist flexion and to wear a wrist extension splint.  Check screening carotid ultrasound, lipid profile and hemoglobin A1c.  Return for follow-up in 6 months with medical nurse practitioner call earlier if necessary.   Greater than 50% time during this visit was spent in counseling and coordination of care about his medullary infarct and postoperative seizures and discussion about management and questions   Delia Heady, MD 02/20/2024, 8:15 AM [facility] [address] [place], [place] [address] [phone]

## 2024-02-21 LAB — LIPID PANEL
Chol/HDL Ratio: 2.6 ratio (ref 0.0–5.0)
Cholesterol, Total: 115 mg/dL (ref 100–199)
HDL: 45 mg/dL (ref >39)
LDL Chol Calc (NIH): 51 mg/dL (ref 0–99)
Triglycerides: 101 mg/dL (ref 0–149)
VLDL Cholesterol Cal: 19 mg/dL (ref 5–40)

## 2024-02-21 LAB — HEMOGLOBIN A1C
Est. average glucose Bld gHb Est-mCnc: 151 mg/dL
Hgb A1c MFr Bld: 6.9 % — ABNORMAL HIGH (ref 4.8–5.6)

## 2024-02-21 NOTE — Progress Notes (Signed)
 Kindly inform the patient that cholesterol profile is satisfactory.  Hemoglobin A1c screening test for diabetes is elevated and he needs to see his primary care physician to discuss diabetes management.

## 2024-02-28 ENCOUNTER — Other Ambulatory Visit: Payer: Self-pay | Admitting: Adult Health

## 2024-03-04 ENCOUNTER — Telehealth: Payer: Self-pay | Admitting: Neurology

## 2024-03-04 NOTE — Telephone Encounter (Signed)
 Gracie from [facility] called stating that the pt has requested for his pregabalin (LYRICA) 100 MG capsule be kept at Q.D. not BID. I spoke to RN and she stated that per the notes on 4/3 the pregabalin (LYRICA) 100 MG capsule was going to be increased to BID therefore request denied unless pt calls office to discuss dosages on the pregabalin (LYRICA) 100 MG capsule .

## 2024-03-10 DIAGNOSIS — N183 Chronic kidney disease, stage 3 unspecified: Secondary | ICD-10-CM | POA: Diagnosis not present

## 2024-03-10 DIAGNOSIS — D631 Anemia in chronic kidney disease: Secondary | ICD-10-CM | POA: Diagnosis not present

## 2024-03-10 DIAGNOSIS — I129 Hypertensive chronic kidney disease with stage 1 through stage 4 chronic kidney disease, or unspecified chronic kidney disease: Secondary | ICD-10-CM | POA: Diagnosis not present

## 2024-03-10 DIAGNOSIS — E1122 Type 2 diabetes mellitus with diabetic chronic kidney disease: Secondary | ICD-10-CM | POA: Diagnosis not present

## 2024-03-13 ENCOUNTER — Ambulatory Visit (HOSPITAL_COMMUNITY)
Admission: RE | Admit: 2024-03-13 | Discharge: 2024-03-13 | Disposition: A | Discharge status: Home / Self Care | Source: Ambulatory Visit | Attending: Neurology | Admitting: Neurology

## 2024-03-13 DIAGNOSIS — I639 Cerebral infarction, unspecified: Secondary | ICD-10-CM | POA: Diagnosis not present

## 2024-03-13 DIAGNOSIS — R202 Paresthesia of skin: Secondary | ICD-10-CM | POA: Diagnosis not present

## 2024-03-13 DIAGNOSIS — Z8673 Personal history of transient ischemic attack (TIA), and cerebral infarction without residual deficits: Secondary | ICD-10-CM | POA: Insufficient documentation

## 2024-03-13 DIAGNOSIS — R200 Anesthesia of skin: Secondary | ICD-10-CM | POA: Diagnosis not present

## 2024-03-17 NOTE — Progress Notes (Signed)
Kindly inform the patient that carotid ultrasound study shows no significant blockages of either carotid artery in the neck

## 2024-03-20 ENCOUNTER — Encounter: Payer: Self-pay | Admitting: Adult Health

## 2024-03-20 ENCOUNTER — Ambulatory Visit: Payer: Medicare Other | Admitting: Adult Health

## 2024-03-20 VITALS — BP 117/83 | HR 85 | Ht 70.0 in | Wt 253.8 lb

## 2024-03-20 DIAGNOSIS — R202 Paresthesia of skin: Secondary | ICD-10-CM | POA: Diagnosis not present

## 2024-03-20 DIAGNOSIS — Z8673 Personal history of transient ischemic attack (TIA), and cerebral infarction without residual deficits: Secondary | ICD-10-CM | POA: Diagnosis not present

## 2024-03-20 NOTE — Progress Notes (Addendum)
 PATIENT: Edwin [name] DOB: 06/23/1956  REASON FOR VISIT: follow up HISTORY FROM: patient PRIMARY NEUROLOGIST: Dr. Janett Medin  Chief Complaint  Patient presents with   Follow-up    Pt in room 17. Alone. Here for stroke follow up. Pt c/o Paresthesias on right hand now, was having left in hand.      HISTORY OF PRESENT ILLNESS: Today 03/20/24:  Edwin [name] is a 68 y.o. male with a history of stroke and paresthesias in the upper extremities. Returns today for follow-up.  Patient just followed up with Dr. Janett Medin at the beginning of April.  Nerve conduction studies with EMG was ordered and the patient was instructed to increase Lyrica  to 100 mg twice a day.  He states that he has been taking 75 mg twice a day.  He did not think Dr. Janett Medin had sent in a new prescription.  I reassured him that the prescription was sent in on April 3.  Patient is also concerned that Lyrica  may be contributing to erectile dysfunction.  States that he just recently got married and that has been an issue. Reports that he has been to urology in the past and has tried Cialis and viagra   Update/01/2024.  Patient is referred back today for evaluation by her primary physician for numbness in his hands.  Patient had complained of postop paresthesias with mostly left hand numbness at last visit.  Alaine Loughney had recommended reducing and stopping the gabapentin  and increasing the Lyrica .  Patient is on Lyrica  100 mg daily which helped somewhat but he is concerned that for the last few months he has noticed intermittent paresthesias in his right hand as well.  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  She states the paresthesias are present throughout the day.  They do not necessarily wake him up from sleep.  He has not been wearing any wrist splints.  He is tolerating Lyrica  well without side effects.  He denies any recurrent stroke or TIA symptoms.  He remains on Plavix  she is tolerating well without bleeding  or bruising.  He is tolerating Crestor  well without muscle aches and pains.  He is not sure when he had his last lipid profile checked.  His blood pressure is under good control.    09/03/23: Edwin [name] is a 68 y.o. male who has been followed in this office for history of stroke with residual paresthesias in the left upper extremity.Returns today for follow-up. Patient has remained on Lyrica  and gabapentin . Through a mychart visit- I instructed him how to wean off Gabapentin . However, he states that he thought he was suppose to stay on it. No longer complaining of drowsiness. Still having burning and tingling in the LUE.   HISTORY 11/29/22:  Edwin [name] is a 68 year old male with a history of stroke with residual paresthesias in the left upper extremity.  He returns today to discuss his medication.  Reports that he is still having tingling in that hand.  Has gotten some relief with gabapentin .  Has tried doing gabapentin  400 mg 3 times a day but this caused too much drowsiness.  He is now taking 400 twice a day.  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  HISTORY 10/02/22: Edwin [name] is a 68 year old male with a history of stroke with residual paresthesias in the left upper extremity.  He returns today for follow-up.  He is currently on gabapentin  taking 400 mg twice a day. Tingling down the arm comes and goes. Reports tingling in the back of the left leg as well. Doesn't notice when he is busy but bothersome at other times.  Symptoms were not there prior to his stroke but has remained since history.  He returns today for evaluation     06/20/22: Edwin [name] is a 68 year old male with a history of stroke with residual paresthesia.  He returns today for follow-up. Patient Shelah Derry to the ED on July 15 with left arm and leg numbness.  The patient had residual numbness in the left arm and leg from his prior stroke but felt that it is gotten worse.  Work-up in the  ED was relatively unremarkable.  He was started on Topamax  for paresthesias but feels that this made it worse.  He returns today to discuss another medication to try. He describes it has pins and needles sensation in the left arm and leg- not as much numbness. He teaches piano.    Reports that he does feel that is balance is off. Tends to veer to the left when ambulating. Feels like he is going to fall but fortunately has not had any falls.  Reports that when he was completing physical therapy he did find it beneficial.  Would like to have another round of physical therapy.   03/22/22: Edwin [name] is a 68 year old male who presented to the ED on March 19 with left-sided sensory deficit and difficulty with ambulation.  He did not receive TNKase due to being out of the window.   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 CT head - 02/04/2022 - No acute intracranial process.  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  Continue Plavix  on discharge   Patient reports that he is doing well. Continues to have some tingling on the left side of the body.  Feels it down the arm and leg. Did not complete any therapy. Reports that he had blood work through PCP and reports that LDL is better and is no longer Crestor . States that it caused fatigue and muscle pain.    Reports that he is monitoring diet and sees a nutritionist   OSA: was using it but then went on a trip and the machine adaptor was broken and was not able to get a replacement CPAP. Was seeing Dr. Cherlyn Cornet.   States that he struggled using the CPAP.  Reports that he would wake up every hour.  Reports that he tried different mask with no benefit.  Reports that he is interested in inspire but Dr. Cherlyn Cornet does not do this.  He has not seen Dr. Cherlyn Cornet in the last year.  He feels that his last sleep study was in 2021 he is requesting to see our sleep physicians here to see if he is a candidate for possible inspire device   REVIEW OF SYSTEMS: Out of a complete 14 system review of symptoms, the patient complains only of the following symptoms, and all  other reviewed systems are negative.  ALLERGIES: Allergies  Allergen Reactions   Allopurinol Other (See Comments)    Made pt emotionally unstable  Other reaction(s): emotionally labile   Penicillins Hives and Other (See Comments)   Finerenone Other (See Comments)    hyperkalemia Other reaction(s): Increases  potassium levels   Penicillin G     Other reaction(s): hives   Aspirin Hives   Ciprofloxacin Rash    Other reaction(s): rash    HOME MEDICATIONS: Outpatient Medications Prior to Visit  Medication Sig Dispense Refill   amLODipine  (NORVASC ) 5 MG tablet Take 5 mg by mouth daily.     Cholecalciferol  1000 units TBDP Take 1 tablet by mouth daily.      clopidogrel  (PLAVIX ) 75 MG tablet TAKE ONE TABLET BY MOUTH DAILY 90 tablet 1   dapagliflozin  propanediol (FARXIGA ) 10 MG TABS tablet Take 10 mg by mouth daily.     diclofenac Sodium (VOLTAREN) 1 % GEL Apply 2 g topically in the morning and at bedtime.     divalproex  (DEPAKOTE  ER) 500 MG 24 hr tablet Take 2 tablets (1,000 mg total) by mouth at bedtime. 60 tablet 5   DULoxetine  (CYMBALTA ) 60 MG capsule Take 2 capsules (120 mg total) by mouth daily. 60 capsule 5   febuxostat  (ULORIC ) 40 MG tablet Take 40 mg by mouth daily.     furosemide  (LASIX ) 20 MG tablet Take 20 mg by mouth every other day.     KERENDIA 10 MG TABS Take 1 tablet by mouth daily.     lamoTRIgine  (LAMICTAL ) 200 MG tablet Take 1 tablet  (200 mg total) by mouth every morning. 90 tablet 1   LevOCARNitine  (L-CARNITINE) 500 MG TABS Take 500 mg by mouth 2 (two) times daily.     losartan  (COZAAR ) 50 MG tablet Take 50 mg by mouth daily.     magnesium  oxide (MAG-OX) 400 (240 Mg) MG tablet Take 400 mg by mouth daily.     multivitamin-lutein (OCUVITE-LUTEIN) CAPS capsule Take 1 capsule by mouth daily.     pramipexole  (MIRAPEX ) 0.5 MG tablet Take 1 tablet (0.5 mg total) by mouth 2 (two) times daily. 60 tablet 5   pregabalin  (LYRICA ) 100 MG capsule Take 1 capsule (100 mg total) by mouth 2 (two) times daily. 60 capsule 3   sodium zirconium cyclosilicate (LOKELMA) 10 g PACK packet Take 10 g by mouth.     tamsulosin  (FLOMAX ) 0.4 MG CAPS capsule Take 0.4 mg by mouth daily.     triamcinolone  ointment (KENALOG ) 0.5 % Apply 1 Application topically 2 (two) times daily. 30 g 0   ipratropium (ATROVENT) 0.06 % nasal spray Place 2 sprays into both nostrils 3 (three) times daily. (Patient not taking: Reported on 03/20/2024)     rosuvastatin  (CRESTOR ) 40 MG tablet Take 1 tablet (40 mg total) by mouth daily. 90 tablet 3   sildenafil (VIAGRA) 100 MG tablet Take 100 mg by mouth as needed. (Patient not taking: Reported on 03/20/2024)     VELTASSA 8.4 g packet Take 8.4 g by mouth See admin instructions. Take one packet by mouth three times a week. Days vary. (Patient not taking: Reported on 03/20/2024)     No facility-administered medications prior to visit.    PAST MEDICAL HISTORY: Past Medical History:  Diagnosis Date   Allergic rhinitis    Angio-edema    Bipolar disorder (HCC)    Bradycardia 2012    due to lithium   CKD (chronic kidney disease) stage 2, GFR 60-89  ml/min    DM2 (diabetes mellitus, type 2) (HCC)    Gout    Hypertension    Mild renal insufficiency    , with creatinine of 1.3 in 2010, was side effect of med   Obesity    ,moderate   Prostatitis    , Episodic   Stroke [facility])    Suicide attempt (HCC) 09/20/2014   Urticaria     PAST  SURGICAL HISTORY: Past Surgical History:  Procedure Laterality Date   APPENDECTOMY     CATARACT EXTRACTION Right    COLONOSCOPY WITH PROPOFOL  N/A 06/14/2014   Procedure: COLONOSCOPY WITH PROPOFOL ;  Surgeon: Garrett Kallman, MD;  Location: [facility];  Service: Endoscopy;  Laterality: N/A;   TONSILLECTOMY     UMBILICAL HERNIA REPAIR      FAMILY HISTORY: Family History  Problem Relation Age of Onset   Other Mother        Viral Meningitis   Mitral valve prolapse Mother    Arrhythmia Mother    Hypothyroidism Mother    Stroke Mother    Hypertension Father    Gout Father    Alzheimer's disease Father     SOCIAL HISTORY: Social History   Socioeconomic History   Marital status: Married    Spouse name: Sweet   Number of children: 0   Years of education: Not on file   Highest education level: Master's degree (e.g., MA, MS, MEng, MEd, MSW, MBA)  Occupational History   Not on file  Tobacco Use   Smoking status: Never    Passive exposure: Never   Smokeless tobacco: Never  Vaping Use   Vaping status: Never Used  Substance and Sexual Activity   Alcohol use: No   Drug use: No   Sexual activity: Not on file  Other Topics Concern   Not on file  Social History Narrative   Lives alone   Left handed   Caffeine: 2 ice tea a day   Social Drivers of Health   Financial Resource Strain: Low Risk  (02/22/2022)   Overall Financial Resource Strain (CARDIA)    Difficulty of Paying Living Expenses: Not hard at all  Food Insecurity: No Food Insecurity (08/09/2023)   Hunger Vital Sign    Worried About Running Out of Food in the Last Year: Never true    Ran Out of Food in the Last Year: Never true  Transportation Needs: No Transportation Needs (08/09/2023)   PRAPARE - Administrator, Civil Service (Medical): No    Lack of Transportation (Non-Medical): No  Physical Activity: Not on file  Stress: No Stress Concern Present (02/22/2022)   [employer] of Occupational Health  - Occupational Stress Questionnaire    Feeling of Stress : Only a little  Social Connections: Unknown (04/03/2022)   [facility]   Social Network    Social Network: Not on file  Intimate Partner Violence: Not At Risk (08/09/2023)   Humiliation, Afraid, Rape, and Kick questionnaire    Fear of Current or Ex-Partner: No    Emotionally Abused: No    Physically Abused: No    Sexually Abused: No      PHYSICAL EXAM  Vitals:   03/20/24 0956  BP: 117/83  Pulse: 85  SpO2: 96%  Weight: 253 lb 12.8 oz (115.1 kg)  Height: 5\' 10"  (1.778 m)   Body mass index is 36.42 kg/m.  Generalized: Well developed, in no acute distress   Neurological examination  Mentation: Alert  oriented to time, place, history taking. Follows all commands speech and language fluent Cranial nerve II-XII: Pupils were equal round reactive to light. Extraocular movements were full, visual field were full on confrontational test. Facial sensation and strength were normal. . Head turning and shoulder shrug  were normal and symmetric. Motor: The motor testing reveals 5 over 5 strength of all 4 extremities. Good symmetric motor tone is noted throughout.  Sensory: Sensory testing is intact to soft touch on all 4 extremities. No evidence of extinction is noted.  Coordination: Cerebellar testing reveals good finger-nose-finger and heel-to-shin bilaterally.  Gait and station: Gait is normal.  Reflexes: Deep tendon reflexes are symmetric and normal bilaterally.   DIAGNOSTIC DATA (LABS, IMAGING, TESTING) - I reviewed patient records, labs, notes, testing and imaging myself where available.  Lab Results  Component Value Date   WBC 9.8 10/24/2023   HGB 12.9 (L) 10/24/2023   HCT 39.3 10/24/2023   MCV 92.3 10/24/2023   PLT 174 10/24/2023      Component Value Date/Time   NA 141 10/24/2023 0258   K 4.4 10/24/2023 0258   CL 108 10/24/2023 0258   CO2 23 10/24/2023 0258   GLUCOSE 160 (H)  10/24/2023 0258   BUN 43 (H) 10/24/2023 0258   CREATININE 1.63 (H) 10/24/2023 0258   CALCIUM  9.1 10/24/2023 0258   PROT 7.1 10/24/2023 0258   ALBUMIN 3.9 10/24/2023 0258   AST 19 10/24/2023 0258   ALT 23 10/24/2023 0258   ALKPHOS 87 10/24/2023 0258   BILITOT 0.4 10/24/2023 0258   GFRNONAA 46 (L) 10/24/2023 0258   GFRAA >60 02/04/2017 0525   Lab Results  Component Value Date   CHOL 115 02/20/2024   HDL 45 02/20/2024   LDLCALC 51 02/20/2024   TRIG 101 02/20/2024   CHOLHDL 2.6 02/20/2024   Lab Results  Component Value Date   HGBA1C 6.9 (H) 02/20/2024   No results found for: "VITAMINB12" Lab Results  Component Value Date   TSH 1.405 05/30/2023      ASSESSMENT AND PLAN 68 y.o. year old male  has a past medical history of Allergic rhinitis, Angio-edema, Bipolar disorder (HCC), Bradycardia (2012), CKD (chronic kidney disease) stage 2, GFR 60-89 ml/min, DM2 (diabetes mellitus, type 2) (HCC), Gout, Hypertension, Mild renal insufficiency, Obesity, Prostatitis, Stroke (HCC), Suicide attempt (HCC) (09/20/2014), and Urticaria. here with:  H/o of stroke with residual paresthesias Paresthesias in the right upper extremity   Patient just saw Dr. Janett Medin at the beginning of the month.  Nerve conduction studies with EMG was ordered-however it does not look like it has been scheduled.  I will work on this today.  Encouraged patient to continue with the Lyrica  100 mg twice a day for now.  He was concerned about potential side effect of erectile dysfunction.  I did advise the patient that we could wean off of Lyrica  to see if this improved.  However the patient deferred for now.  I did advise that getting nerve conduction studies completed prior to any additional medication changes was ideal.  Certainly if symptoms worsen or he develops new symptoms he should let us  know.  Follow-up after nerve conduction studies     Clem Currier, MSN, NP-C 03/20/2024, 10:48 AM [facility] [address] [place], [place] [address] [phone]

## 2024-03-31 ENCOUNTER — Other Ambulatory Visit: Payer: Self-pay | Admitting: Adult Health

## 2024-03-31 ENCOUNTER — Other Ambulatory Visit: Payer: Self-pay | Admitting: Psychiatry

## 2024-03-31 DIAGNOSIS — F3181 Bipolar II disorder: Secondary | ICD-10-CM

## 2024-03-31 DIAGNOSIS — N183 Chronic kidney disease, stage 3 unspecified: Secondary | ICD-10-CM | POA: Diagnosis not present

## 2024-04-06 DIAGNOSIS — R972 Elevated prostate specific antigen [PSA]: Secondary | ICD-10-CM | POA: Diagnosis not present

## 2024-04-06 DIAGNOSIS — N401 Enlarged prostate with lower urinary tract symptoms: Secondary | ICD-10-CM | POA: Diagnosis not present

## 2024-04-06 DIAGNOSIS — R3915 Urgency of urination: Secondary | ICD-10-CM | POA: Diagnosis not present

## 2024-04-06 DIAGNOSIS — N5201 Erectile dysfunction due to arterial insufficiency: Secondary | ICD-10-CM | POA: Diagnosis not present

## 2024-04-15 DIAGNOSIS — E349 Endocrine disorder, unspecified: Secondary | ICD-10-CM | POA: Diagnosis not present

## 2024-04-28 ENCOUNTER — Other Ambulatory Visit: Payer: Self-pay | Admitting: Psychiatry

## 2024-04-28 DIAGNOSIS — F3181 Bipolar II disorder: Secondary | ICD-10-CM

## 2024-04-28 DIAGNOSIS — F4310 Post-traumatic stress disorder, unspecified: Secondary | ICD-10-CM

## 2024-04-28 DIAGNOSIS — M25561 Pain in right knee: Secondary | ICD-10-CM | POA: Diagnosis not present

## 2024-04-28 DIAGNOSIS — M25562 Pain in left knee: Secondary | ICD-10-CM | POA: Diagnosis not present

## 2024-04-29 ENCOUNTER — Ambulatory Visit: Admitting: Neurology

## 2024-04-29 VITALS — BP 95/58 | HR 74 | Ht 70.0 in | Wt 252.0 lb

## 2024-04-29 DIAGNOSIS — E291 Testicular hypofunction: Secondary | ICD-10-CM | POA: Diagnosis not present

## 2024-04-29 DIAGNOSIS — N5201 Erectile dysfunction due to arterial insufficiency: Secondary | ICD-10-CM | POA: Diagnosis not present

## 2024-04-29 DIAGNOSIS — E1142 Type 2 diabetes mellitus with diabetic polyneuropathy: Secondary | ICD-10-CM | POA: Insufficient documentation

## 2024-04-29 DIAGNOSIS — G5603 Carpal tunnel syndrome, bilateral upper limbs: Secondary | ICD-10-CM | POA: Diagnosis not present

## 2024-04-29 DIAGNOSIS — I639 Cerebral infarction, unspecified: Secondary | ICD-10-CM | POA: Diagnosis not present

## 2024-04-29 NOTE — Progress Notes (Signed)
 Chief Complaint  Patient presents with   NERVE CONDUCTION STUDY    Emgrm4, alone, Pt is well and ready for NERVE CONDUCTION STUDY.       ASSESSMENT AND PLAN  JAQUARIOUS GREY is a 68 y.o. 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  He does not want to consider surgical intervention, suggested wrist splint, tighter control of his diabetes,   DIAGNOSTIC DATA (LABS, IMAGING, TESTING) - I reviewed patient records, labs, notes, testing and imaging myself where available.   MEDICAL HISTORY:  ELIGA ARVIE, is a 68 year old right-handed male,  seen in request by Dr. Janett Medin for EMG nerve conduction study to evaluate his left arm paresthesia  History is obtained from the patient and review of electronic medical records. I personally reviewed pertinent available imaging films in PACS.   PMHx of  Bipolar HTN Stroke DM   He suffered small right medullary infarction in March 2023, presenting with left arm and leg numbness mild weakness, gait abnormality  Over time his left lower extremity paresthesia has improved, but has persistent left arm paresthesia, recently also developed right finger numbness tingling, gradually getting worse  He has sedentary lifestyle, works as a Insurance underwriter, mild gait abnormality, do have lower extremity paresthesia, he also has left lower extremity more than right pitting edema, Doppler study in the past showed no DVT  Labs in 2025: A1c 6.9, LDL  51,   PHYSICAL EXAM:   Vitals:   04/29/24 0834  BP: (!) 95/58  Pulse: 74  SpO2: 98%  Weight: 252 lb (114.3 kg)  Height: 5' 10 (1.778 m)   Body mass index is 36.16 kg/m.  PHYSICAL EXAMNIATION:  Gen: NAD, conversant, well nourised, well groomed                     Cardiovascular: Regular rate rhythm, no peripheral edema, warm, nontender. Eyes: Conjunctivae clear without exudates or hemorrhage Neck: Supple, no carotid bruits. Pulmonary: Clear to auscultation bilaterally   NEUROLOGICAL EXAM:  MENTAL STATUS: Speech/cognition: Awake, alert, oriented to history taking and casual conversation CRANIAL NERVES: CN II: Visual fields are full to confrontation. Pupils are round equal and briskly reactive to light. CN III, IV, VI: extraocular movement are normal. No ptosis. CN V: Facial sensation is intact to light touch CN VII: Face is symmetric with normal eye closure  CN VIII: Hearing is normal to causal conversation. CN IX, X: Phonation is normal. CN XI: Head turning and shoulder shrug are intact  MOTOR: Lower extremity pitting edema left worse than right, left upper extremity pronation drift, fixation rapid rotating movement, mild left lower extremity drift  REFLEXES: Reflexes are 2+ and symmetric at the biceps, triceps, knees, and absent at ankles. Plantar responses are flexor.  SENSORY: Length-dependent sensory changes, decreased vibratory sensation light touch pinprick to below knee level, hairline receding to mid shin level, decreased pinprick at fingerpads  COORDINATION: There is no trunk or limb dysmetria noted.  GAIT/STANCE: Push-up, cautious, dragging left leg exam  REVIEW OF SYSTEMS:  Full 14 system review of systems performed and  notable only for as above All other review of systems were negative.   ALLERGIES: Allergies  Allergen Reactions   Allopurinol Other (See Comments)    Made pt emotionally unstable  Other reaction(s): emotionally labile    Penicillins Hives and Other (See Comments)   Finerenone Other (See Comments)    hyperkalemia Other reaction(s): Increases  potassium levels   Penicillin G     Other reaction(s): hives   Aspirin Hives   Ciprofloxacin Rash    Other reaction(s): rash    HOME MEDICATIONS: Current Outpatient Medications  Medication Sig Dispense Refill   amLODipine  (NORVASC ) 5 MG tablet Take 5 mg by mouth daily.     Cholecalciferol  1000 units TBDP Take 1 tablet by mouth daily.      clopidogrel  (PLAVIX ) 75 MG tablet TAKE ONE TABLET BY MOUTH DAILY 90 tablet 1   dapagliflozin  propanediol (FARXIGA ) 10 MG TABS tablet Take 10 mg by mouth daily.     divalproex  (DEPAKOTE  ER) 500 MG 24 hr tablet Take 2 tablets (1,000 mg total) by mouth at bedtime. 60 tablet 5   DULoxetine  (CYMBALTA ) 60 MG capsule Take 2 capsules (120 mg total) by mouth daily. 60 capsule 5   febuxostat  (ULORIC ) 40 MG tablet Take 40 mg by mouth daily.     furosemide  (LASIX ) 20 MG tablet Take 20 mg by mouth every other day.     lamoTRIgine  (LAMICTAL ) 200 MG tablet Take 1 tablet (200 mg total) by mouth every morning. 90 tablet 1   LevOCARNitine  (L-CARNITINE) 500 MG TABS Take 500 mg by mouth 2 (two) times daily.     losartan  (COZAAR ) 50 MG tablet Take 50 mg by mouth daily.     magnesium  oxide (MAG-OX) 400 (240 Mg) MG tablet Take 400 mg by mouth daily.     multivitamin-lutein (OCUVITE-LUTEIN) CAPS capsule Take 1 capsule by mouth daily.     pramipexole  (MIRAPEX ) 0.5 MG tablet Take 1 tablet (0.5 mg total) by mouth 2 (two) times daily. 60 tablet 5   pregabalin  (LYRICA ) 100 MG capsule Take 1 capsule (100 mg total) by mouth 2 (two) times daily. 60 capsule 3   rosuvastatin  (CRESTOR ) 40 MG tablet Take 1 tablet (40 mg total) by mouth daily. 90 tablet 3   sodium zirconium cyclosilicate (LOKELMA) 10 g PACK packet Take 10 g by mouth.     tamsulosin  (FLOMAX ) 0.4 MG CAPS capsule Take 0.4 mg by mouth daily.     No current facility-administered medications for this  visit.    PAST MEDICAL HISTORY: Past Medical History:  Diagnosis Date   Allergic rhinitis    Angio-edema    Bipolar disorder (HCC)    Bradycardia 2012    due to lithium   CKD (chronic kidney disease) stage 2, GFR 60-89 ml/min    DM2 (diabetes mellitus, type 2) (HCC)    Gout    Hypertension    Mild renal insufficiency    , with creatinine of 1.3 in 2010, was side effect of med   Obesity    ,moderate   Prostatitis    , Episodic   Stroke (HCC)    Suicide attempt (HCC) 09/20/2014   Urticaria     PAST SURGICAL HISTORY: Past Surgical History:  Procedure Laterality Date   APPENDECTOMY     CATARACT EXTRACTION Right    COLONOSCOPY WITH PROPOFOL  N/A 06/14/2014   Procedure: COLONOSCOPY WITH PROPOFOL ;  Surgeon: Garrett Kallman, MD;  Location: [facility];  Service: Endoscopy;  Laterality: N/A;   TONSILLECTOMY  UMBILICAL HERNIA REPAIR      FAMILY HISTORY: Family History  Problem Relation Age of Onset   Other Mother        Viral Meningitis   Mitral valve prolapse Mother    Arrhythmia Mother    Hypothyroidism Mother    Stroke Mother    Hypertension Father    Gout Father    Alzheimer's disease Father     SOCIAL HISTORY: Social History   Socioeconomic History   Marital status: Married    Spouse name: Sweet   Number of children: 0   Years of education: Not on file   Highest education level: Master's degree (e.g., MA, MS, MEng, MEd, MSW, MBA)  Occupational History   Not on file  Tobacco Use   Smoking status: Never    Passive exposure: Never   Smokeless tobacco: Never  Vaping Use   Vaping status: Never Used  Substance and Sexual Activity   Alcohol use: No   Drug use: No   Sexual activity: Not on file  Other Topics Concern   Not on file  Social History Narrative   Lives alone   Left handed   Caffeine: 2 ice tea a day   Social Drivers of Health   Financial Resource Strain: Low Risk  (02/22/2022)   Overall Financial Resource Strain (CARDIA)    Difficulty  of Paying Living Expenses: Not hard at all  Food Insecurity: No Food Insecurity (08/09/2023)   Hunger Vital Sign    Worried About Running Out of Food in the Last Year: Never true    Ran Out of Food in the Last Year: Never true  Transportation Needs: No Transportation Needs (08/09/2023)   PRAPARE - Administrator, Civil Service (Medical): No    Lack of Transportation (Non-Medical): No  Physical Activity: Not on file  Stress: No Stress Concern Present (02/22/2022)   [employer] of Occupational Health - Occupational Stress Questionnaire    Feeling of Stress : Only a little  Social Connections: Unknown (04/03/2022)   Received from [facility]   Social Network    Social Network: Not on file  Intimate Partner Violence: Not At Risk (08/09/2023)   Humiliation, Afraid, Rape, and Kick questionnaire    Fear of Current or Ex-Partner: No    Emotionally Abused: No    Physically Abused: No    Sexually Abused: No      Phebe Brasil, M.D. Ph.D.  [facility] [address] [place], [place] [address] Ph: [phone] Fax: [phone]  CC:  Sun, Vyvyan, MD [phone] [address] [place],  [place] [address]  Sun, Vyvyan, MD

## 2024-04-29 NOTE — Procedures (Signed)
 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  Left peroneal to EDB, tibial motor responses were absent.  Right median sensory response was absent.  Left median sensory response showed moderately prolonged peak latency  Right median motor responses showed significantly decreased CMAP amplitude  Left median motor response showed mildly prolonged distal latency with well-preserved CMAP amplitude  Bilateral ulnar sensory response showed mildly decreased snap amplitude.  Bilateral ulnar motor responses were within normal limits  Bilateral radial sensory responses were within normal limits  Electromyography: Selected needle examination of left lower extremity muscles, bilateral upper extremity muscles and cervical paraspinals was performed  Right abductor pollicis brevis, increased insertional activity 1+ spontaneous activity, in large complex motor unit potentials with decreased recruitment patterns.  Left abductor pollicis brevis: There was no spontaneous activity, normal lymphology motor unit potentials with mildly decreased recruitment patterns.  Rest of the needle examination showed no significant abnormality.   Conclusion: This is an abnormal study.  There is electrodiagnostic evidence of length-dependent severe axonal sensorimotor polyneuropathy.  In addition there is evidence of bilateral distal median neuropathy across the wrist, consistent with bilateral carpal tunnel syndromes, right side is severe with evidence of axonal loss; left side is moderate, demyelinating in  nature.    ------------------------------- Phebe Brasil. M.D. Ph.D.  [facility] [address], [address] [place], [place] [address] Tel: [phone] Fax: [phone]  Verbal informed consent was obtained from the patient, patient was informed of potential risk of procedure, including bruising, bleeding, hematoma formation, infection, muscle weakness, muscle pain, numbness, among others.        MNC    Nerve / Sites Muscle Latency Ref. Amplitude Ref. Rel Amp Segments Distance Velocity Ref. Area    ms ms mV mV %  cm m/s m/s mVms  R Median - APB     Wrist APB 4.8 <=4.4 0.5 >=4.0 100 Wrist - APB 7   1.1     Upper arm APB 13.6  0.4  85.5 Upper arm - Wrist 23 26 >=49 1.2  L Median - APB     Wrist APB 4.6 <=4.4 6.2 >=4.0 100 Wrist - APB 7   21.0     Upper arm APB 8.9  6.1  97.7 Upper arm - Wrist 23 54 >=49 19.2  R Ulnar - ADM     Wrist ADM 2.8 <=3.3 8.3 >=6.0 100 Wrist - ADM 7   29.0     B.Elbow ADM 5.3  9.0  109 B.Elbow - Wrist 14 55 >=49 30.1     A.Elbow ADM 8.7  8.9  98.5 A.Elbow - B.Elbow 18 53 >=49 29.7  L Ulnar - ADM     Wrist ADM 3.1 <=3.3 9.3 >=6.0 100 Wrist - ADM 10   26.6     B.Elbow ADM 5.0  8.3  89.7 B.Elbow - Wrist 10 54 >=49 25.4     A.Elbow ADM 7.8  7.6  90.6 A.Elbow - B.Elbow 14 50 >=49 27.2  L Peroneal - EDB     Ankle EDB NR <=6.5 NR >=2.0 NR Ankle - EDB 9   NR  Pop fossa - Ankle      L Tibial - AH     Ankle AH NR <=5.8 NR >=4.0 NR Ankle - AH 9   NR                 SNC    Nerve / Sites Rec. Site Peak Lat Ref.  Amp Ref. Segments Distance    ms ms V V  cm  R Radial - Anatomical snuff box (Forearm)     Forearm Wrist 2.1 <=2.9 19 >=15 Forearm - Wrist 10  L Radial - Anatomical snuff box (Forearm)     Forearm Wrist 2.4 <=2.9 17 >=15 Forearm - Wrist 10  L Sural - Ankle (Calf)     Calf Ankle NR <=4.4 NR >=6 Calf - Ankle 14  L Superficial peroneal - Ankle     Lat leg Ankle NR <=4.4 NR >=6 Lat leg - Ankle 14  R Median - Orthodromic (Dig II,  Mid palm)     Dig II Wrist NR <=3.4 NR >=10 Dig II - Wrist 13  L Median - Orthodromic (Dig II, Mid palm)     Dig II Wrist 4.2 <=3.4 11 >=10 Dig II - Wrist 13  R Ulnar - Orthodromic, (Dig V, Mid palm)     Dig V Wrist 3.1 <=3.1 3 >=5 Dig V - Wrist 11  L Ulnar - Orthodromic, (Dig V, Mid palm)     Dig V Wrist 2.9 <=3.1 4 >=5 Dig V - Wrist 9                       F  Wave    Nerve F Lat Ref.   ms ms  R Ulnar - ADM 30.9 <=32.0  R Median - APB 40.0 <=31.0  L Ulnar - ADM 31.4 <=32.0           EMG Summary Table    Spontaneous MUAP Recruitment  Muscle IA Fib PSW Fasc Other Amp Dur. Poly Pattern  R. First dorsal interosseous Normal None None None _______ Normal Normal Normal Normal  R. Pronator teres Normal None None None _______ Normal Normal Normal Normal  R. Biceps brachii Normal None None None _______ Normal Normal Normal Normal  R. Deltoid Normal None None None _______ Normal Normal Normal Normal  R. Triceps brachii Normal None None None _______ Normal Normal Normal Normal  R. Cervical paraspinals Normal None None None _______ Normal Normal Normal Normal  R. Abductor pollicis brevis Increased 1+ None None _______ Increased Increased 1+ Reduced  L. First dorsal interosseous Normal None None None _______ Normal Normal Normal Normal  L. Abductor pollicis brevis Normal None None None _______ Normal Normal Normal Reduced  L. Pronator teres Normal None None None _______ Normal Normal Normal Normal  L. Biceps brachii Normal None None None _______ Normal Normal Normal Normal  L. Deltoid Normal None None None _______ Normal Normal Normal Normal  L. Triceps brachii Normal None None None _______ Normal Normal Normal Normal  L. Cervical paraspinals Normal None None None _______ Normal Normal Normal Normal  L. Tibialis anterior Normal None None None _______ Normal Normal Normal Normal  L. Peroneus longus Normal None None None _______ Normal Normal Normal Normal  L. Gastrocnemius (Medial head) Normal  None None None _______ Normal Normal Normal Normal  L. Vastus lateralis Normal None None None _______ Normal Normal Normal Normal

## 2024-05-04 ENCOUNTER — Ambulatory Visit: Payer: Medicare Other | Admitting: Adult Health

## 2024-05-18 DIAGNOSIS — M25561 Pain in right knee: Secondary | ICD-10-CM | POA: Diagnosis not present

## 2024-05-18 DIAGNOSIS — M25562 Pain in left knee: Secondary | ICD-10-CM | POA: Diagnosis not present

## 2024-05-19 ENCOUNTER — Encounter: Payer: Self-pay | Admitting: Adult Health

## 2024-05-19 DIAGNOSIS — E291 Testicular hypofunction: Secondary | ICD-10-CM | POA: Diagnosis not present

## 2024-05-25 DIAGNOSIS — M25561 Pain in right knee: Secondary | ICD-10-CM | POA: Diagnosis not present

## 2024-05-25 DIAGNOSIS — M25562 Pain in left knee: Secondary | ICD-10-CM | POA: Diagnosis not present

## 2024-05-26 ENCOUNTER — Other Ambulatory Visit: Payer: Self-pay | Admitting: Cardiology

## 2024-05-26 ENCOUNTER — Ambulatory Visit: Admitting: Psychiatry

## 2024-05-26 ENCOUNTER — Encounter: Payer: Self-pay | Admitting: Psychiatry

## 2024-05-26 DIAGNOSIS — F3181 Bipolar II disorder: Secondary | ICD-10-CM

## 2024-05-26 DIAGNOSIS — F4310 Post-traumatic stress disorder, unspecified: Secondary | ICD-10-CM | POA: Diagnosis not present

## 2024-05-26 DIAGNOSIS — R748 Abnormal levels of other serum enzymes: Secondary | ICD-10-CM

## 2024-05-26 DIAGNOSIS — G4733 Obstructive sleep apnea (adult) (pediatric): Secondary | ICD-10-CM

## 2024-05-26 NOTE — Progress Notes (Signed)
 [name] [id number] [birth year] 68 y.o.  Subjective:   Patient ID:  Edwin [name] is a 68 y.o. (DOB 1956/03/22) male.  Chief Complaint:  Chief Complaint  Patient presents with   Follow-up   Depression   Anxiety    Edwin [name] presents to the office today for follow-up of bipolar depression and anxiety.  seen July 02, 2019.  No meds were changed.  Patient was doing well.  He asked me to write a letter to his attorney and support his intention to marry a woman from overseas and that his mental health was sufficiently well that he would not represent a danger to his fiance.  This was discussed with his attorney and the letter was written.  Met girl June 8. Met her on dating site international.  [place] woman and decided wanted to get married.  Options opening up after being shut down by Covid.  seen November 2020.  No meds were changed.  He was overall doing well.  seen January 20, 2020 .  No med changed.  Following noted. Wonders about elevated liver enzymes and Depakote .  Labs not visible in Epic.  PCP Dr. Jon Jacob is working up this finding.   Doing great from mental health.  Talks daily to GF in [place].    03/23/20 the following is noted:    Taken on too many students.  Recognizes needs to have 2 days off a week and plans to schedule that.  Needs to get through end of the semester.  Fiance visa is difficult but she just got admitted to [facility] pending some exams being passed.  Visa difficult with Covid affecting travel.  She could be here in mid-July. No med changes  07/06/20 with the following noted: Still good. Pramipexole  has kept depression at bay. Mood is still stable. Good interest in basketball. Still concerned about weight and blames meds and wonders about weight loss meds and supplements.  Asked about L-glutamine taken it for a week.  Trying to watch diet.  Hasn't weighed himself. Plan: No med changes indicated yet.  Sig risk in switching his primary mood  stabilizer unless its absolutely nececessary.  10/05/20 appt noted: Consistent with meds.  Still doing great overall.  Some trouble sleeping and got help with CPAP with Dr. Tammy.  Tendency to sleep 3 hours and awakened.  May stay awake 1-2 hours and eventually get sufficient sleep.  Can awaken refreshed. No SE except weight gain.   Got denied for Ozempic.  Very concerned about weight gain with Depakote . Still facetiming with GF in [place] and no visa for her yet bc country still shut down. Plan no med changes  01/05/2021 appointment with the following noted: Expressed concerns about weight and new dx DM.   Disc retrying efforts to get Ozempic.   Has increase glucose.   Been on phone with GF in [place] for 20 mos.  It's dragging on trying to get her here.  Thinking about going there.   Previously lost the love of his life DT his illness.  Sense of loss bc her birthday was a couple of days ago.    05/03/2021 appointment with the following noted: Doing as well as anyone can be doing.  Flew to [place] in April.  People were nice.  Enjoyed the family and went with 27 people to the beach.  GF comes from family of 9.  Going back in July and wedding July 30.  Had Face Timed for 22 mos and  gotten to know the family.  She has not been to [place]  yet but they plan to live here.  Spousal visas are back logged.   Has seen PCP and dietician and is losing weight. Plan: No med changes indicated yet.  Sig risk in switchin g his primary mood stabilizer unless its absolutely nececessary. Failed attempt to reduce pramipexole  below 0.5 mg BID  08/30/21 appt noted: Pretty well.  Got married but she's not here yet.  Working on getting her into the [place] .  Has a good immigration attorney here.  Usually takes a just a couple of mos but maybe longer  DT backlog.   Still taking meds Depakote  1000, duloxetine  120, lamotrigine  200, pramipexole  0.5 mg BID.   Stopped soda.  Working with nutritionist and down  35#. 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   Disc psychology and masculinity goals. No SE.   Satisfied with meds and no indication to change. Plan: continue Depakote  1000, duloxetine  120, lamotrigine  200, pramipexole  0.5 mg BID.   Failed attempt to reduce pramipexole  below 0.5 mg BID  01/01/22 appt noted: Going to [employer].   Very good mental health.   New wife is delayed getting here bc slow immigration.  She's in [place] right now.   Got married in [place]   OK with meds. CKD stable. Plan: No med changes indicated yet.  Sig risk in switching his primary mood stabilizer unless its absolutely nececessary. Depakote  1000, duloxetine  120, lamotrigine  200, pramipexole  0.5 mg BID.    05/01/2022 appointment with the following noted: March 15 dizzy and dx with stroke.  Thinks it might be related to untreated OSA bc CPAP machine burned up and couldn't get another.  O2 levels low in hospital in sleep. Appt  to see sleep doc soon.  Sleeping with O2 now.   No depression occurred.  Handled it well.  Lost 52# this year. Thankful for meds.   Sees Dr. Gailen 7/6 Applied for expedited Visa for wife. Plan: No med changes indicated.  Sig risk in switching his primary mood stabilizer unless its absolutely nececessary. Depakote  1000, duloxetine  120, lamotrigine  200, pramipexole  0.5 mg BID.  Gabapentin  400 BID Failed attempt to reduce pramipexole  below 0.5 mg BID  09/17/22 appt noted: 06/30/22 ED stroke  then had rehab. Stroke in March mild but not again in 8/23.  Slowly better. Walking 2-4 miles daily and it has helped. Enjoys [employer] sports Patient reports stable mood and denies depressed or irritable moods.   Patient denies any recent difficulty with anxiety.  Patient denies difficulty with sleep initiation or maintenance.  Not Using CPAP some issues getting O2.. Sleep hygiene is better now and that' s helped.  Going to bed earlier.   Denies appetite disturbance.  Patient  reports that energy and motivation have been good.  Patient denies any difficulty with concentration.  Patient denies any suicidal ideation. Mood good wihtout swings or depression.   Satisfied with meds. No alcohol.   Plan no med changes from above  01/21/23 appt noted: Continues meds as above.  Tolerating meds except constipation.  Miralax  helping. Been doing good but youngest cat died and he is grieving.  It was his favorite pet.  Didn't have to put her down.  Happened 2 mos ago.  His cat was Louie.  He was a happy cat.   No mood swings.   Been going to [facility] basketball games.   Visa process is going slow to get sig other (  wife) here from [employer]. Taking a long time.   No mood swings.    05/27/23 appt noted: Newly married and wife here for 2 days.  4 yr process.  W Sweet.   Enjoys teaching and playing piano.   Playing some at [employer] and [employer]. Continue meds. No new SE.  ? Wt gain related. Patient reports stable mood and denies depressed or irritable moods.  Patient denies any recent difficulty with anxiety.  Patient denies difficulty with sleep initiation or maintenance. Denies appetite disturbance.  Patient reports that energy and motivation have been good.  Patient denies any difficulty with concentration.  Patient denies any suicidal ideation. Has seen neuro and on gabapentin  and Lyrica .   Still some PT as needed after the stroke.  Watching BP Hard losing another cat in May which mother named.   Plan: No med changes indicated.  Sig risk in switching his primary mood stabilizer unless its absolutely nececessary. Depakote  1000, duloxetine  120, lamotrigine  200, pramipexole  0.5 mg BID.  Still on gabapentin  400 mg BID  09/30/23 appt noted: Health issues since here with cellulitis, sepsis, fall, sciatica.   Lost cat of 17 years.  Got new cats. Married life is an adjustment.  Going well so far.   Switching from gabapentin  to Lyrica  for sciatica and paresthesia hand and foot. Still  playing piano.   No SE with meds unless wt gain.   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 Mood has remained stable.   Able to get by with less sleep than in the past.  Getting 4-5 hours.  Can schedule nap if needed. Rejuvinating.  To sleep quickly.  No insomnia.   No dep .  No swings.  01/29/24 appt noted: Med: Depakote  1000, duloxetine  120, lamotrigine  200, pramipexole  0.5 mg BID.  Switch gabapentin  400 mg BID to Lyrica  75 mg BID for paresthesia.  Helps some.  SE gabapentin  sleepy Mood pretty much the same.   Some stressor.  Has no contact with sister since his mother died.  Unexpected conflict with student's father out of the blue for no reason none.  Resulting in meaningless, unfounded charge of cyber-stalking.  Had to shut down business for 2 weeks the charge came from an a former student who is an Pensions consultant.  Triggered some PTSD for him.   Overall satisfied with meds.  05/26/24 appt noted:  Med: Depakote  1000, duloxetine  120, lamotrigine  200, pramipexole  0.5 mg BID.   Lyrica  100 mg BID for paresthesia.  Helps some. Testosterone  shots for a week.  SE gabapentin  sleepy Business off 30% in last 8 mos. Will start SS next April. Has court date this Friday for false claim of cyberstalking.  Fears trigger of PTSD with the trial.  Has good atty.  Expects dismissal.  W Sweet doing fine.  Is supportive. No concerns with meds.   Says seeing nephro for CKD with last Cr 1.9 Attending [employer].   Neuro RX Lyrica  with some benefit for nerve pain in hands.   M died 08-03-19.  Was a relief for her.    Past Psychiatric Medication Trials: Lithium witch to VPA 2012 DT increased Cr  Depakote  1000, lamotrigine ,  gabapentin , Vraylar tremor, Rexulti SE, Abilify  increased weight,   Wellbutrin NR, phentermine,   Pramipexole  0.5 mg BID  helped mood L-Carnitine  Psych hosp 1986 helpful   Review of Systems:  Review of Systems  Constitutional:  Negative for unexpected  weight change.  Cardiovascular:  Negative  for palpitations.  Musculoskeletal:  Positive for arthralgias and back pain.  Neurological:  Positive for weakness. Negative for tremors.       Paresthesia in hand  Psychiatric/Behavioral:  Negative for agitation, behavioral problems, confusion, decreased concentration, hallucinations, self-injury, sleep disturbance and suicidal ideas. The patient is not hyperactive.   No longer depressed.   Medications: I have reviewed the patient's current medications.  Current Outpatient Medications  Medication Sig Dispense Refill   amLODipine  (NORVASC ) 5 MG tablet Take 5 mg by mouth daily.     Cholecalciferol  1000 units TBDP Take 1 tablet by mouth daily.      clopidogrel  (PLAVIX ) 75 MG tablet TAKE ONE TABLET BY MOUTH DAILY 90 tablet 1   dapagliflozin  propanediol (FARXIGA ) 10 MG TABS tablet Take 10 mg by mouth daily.     divalproex  (DEPAKOTE  ER) 500 MG 24 hr tablet Take 2 tablets (1,000 mg total) by mouth at bedtime. 60 tablet 5   DULoxetine  (CYMBALTA ) 60 MG capsule Take 2 capsules (120 mg total) by mouth daily. 60 capsule 5   febuxostat  (ULORIC ) 40 MG tablet Take 40 mg by mouth daily.     furosemide  (LASIX ) 20 MG tablet Take 20 mg by mouth every other day.     lamoTRIgine  (LAMICTAL ) 200 MG tablet Take 1 tablet (200 mg total) by mouth every morning. 90 tablet 1   LevOCARNitine  (L-CARNITINE) 500 MG TABS Take 500 mg by mouth 2 (two) times daily.     losartan  (COZAAR ) 50 MG tablet Take 50 mg by mouth daily.     magnesium  oxide (MAG-OX) 400 (240 Mg) MG tablet Take 400 mg by mouth daily.     multivitamin-lutein (OCUVITE-LUTEIN) CAPS capsule Take 1 capsule by mouth daily.     pramipexole  (MIRAPEX ) 0.5 MG tablet Take 1 tablet (0.5 mg total) by mouth 2 (two) times daily. 60 tablet 5   pregabalin  (LYRICA ) 100 MG capsule Take 1 capsule (100 mg total) by mouth 2 (two) times daily. 60 capsule 3   sodium zirconium cyclosilicate (LOKELMA) 10 g PACK packet Take 10 g by mouth.      tamsulosin  (FLOMAX ) 0.4 MG CAPS capsule Take 0.4 mg by mouth daily.     rosuvastatin  (CRESTOR ) 40 MG tablet Take 1 tablet (40 mg total) by mouth daily. 90 tablet 3   No current facility-administered medications for this visit.    Medication Side Effects: Other: weight gain.  Allergies:  Allergies  Allergen Reactions   Allopurinol Other (See Comments)    Made pt emotionally unstable  Other reaction(s): emotionally labile   Penicillins Hives and Other (See Comments)   Finerenone Other (See Comments)    hyperkalemia Other reaction(s): Increases  potassium levels   Penicillin G     Other reaction(s): hives   Aspirin Hives   Ciprofloxacin Rash    Other reaction(s): rash    Past Medical History:  Diagnosis Date   Allergic rhinitis    Angio-edema    Bipolar disorder (HCC)    Bradycardia 2012    due to lithium   CKD (chronic kidney disease) stage 2, GFR 60-89 ml/min    DM2 (diabetes mellitus, type 2) (HCC)    Gout    Hypertension    Mild renal insufficiency    , with creatinine of 1.3 in 2010, was side effect of med   Obesity    ,moderate   Prostatitis    , Episodic   Stroke [facility])    Suicide attempt (HCC) 09/20/2014   Urticaria  Family History  Problem Relation Age of Onset   Other Mother        Viral Meningitis   Mitral valve prolapse Mother    Arrhythmia Mother    Hypothyroidism Mother    Stroke Mother    Hypertension Father    Gout Father    Alzheimer's disease Father     Social History   Socioeconomic History   Marital status: Married    Spouse name: Sweet   Number of children: 0   Years of education: Not on file   Highest education level: Master's degree (e.g., MA, MS, MEng, MEd, MSW, MBA)  Occupational History   Not on file  Tobacco Use   Smoking status: Never    Passive exposure: Never   Smokeless tobacco: Never  Vaping Use   Vaping status: Never Used  Substance and Sexual Activity   Alcohol use: No   Drug use: No   Sexual  activity: Not on file  Other Topics Concern   Not on file  Social History Narrative   Lives alone   Left handed   Caffeine: 2 ice tea a day   Social Drivers of Health   Financial Resource Strain: Low Risk  (02/22/2022)   Overall Financial Resource Strain (CARDIA)    Difficulty of Paying Living Expenses: Not hard at all  Food Insecurity: No Food Insecurity (08/09/2023)   Hunger Vital Sign    Worried About Running Out of Food in the Last Year: Never true    Ran Out of Food in the Last Year: Never true  Transportation Needs: No Transportation Needs (08/09/2023)   PRAPARE - Administrator, Civil Service (Medical): No    Lack of Transportation (Non-Medical): No  Physical Activity: Not on file  Stress: No Stress Concern Present (02/22/2022)   [employer] of Occupational Health - Occupational Stress Questionnaire    Feeling of Stress : Only a little  Social Connections: Unknown (04/03/2022)   Received from [facility]   Social Network    Social Network: Not on file  Intimate Partner Violence: Not At Risk (08/09/2023)   Humiliation, Afraid, Rape, and Kick questionnaire    Fear of Current or Ex-Partner: No    Emotionally Abused: No    Physically Abused: No    Sexually Abused: No    Past Medical History, Surgical history, Social history, and Family history were reviewed and updated as appropriate.   Please see review of systems for further details on the patient's review from today.   Objective:   Physical Exam:  There were no vitals taken for this visit.  Physical Exam Constitutional:      General: He is not in acute distress.    Appearance: He is well-developed.  Musculoskeletal:        General: No deformity.  Neurological:     Mental Status: He is alert and oriented to person, place, and time.     Motor: No tremor.     Coordination: Coordination normal.     Gait: Gait normal.  Psychiatric:        Attention and Perception: Attention normal. He is  attentive.        Mood and Affect: Mood is not anxious or depressed. Affect is not labile, blunt or inappropriate.        Speech: Speech is not rapid and pressured or slurred.        Behavior: Behavior normal.        Thought Content: Thought content  normal. Thought content is not delusional. Thought content does not include homicidal or suicidal ideation. Thought content does not include suicidal plan.        Cognition and Memory: Cognition normal.        Judgment: Judgment normal.     Comments: Insight is good.  Some situational anxiety managed Talkative and pleasant. Recent sig stressors but handling it ok.       Lab Review:     Component Value Date/Time   NA 141 10/24/2023 0258   K 4.4 10/24/2023 0258   CL 108 10/24/2023 0258   CO2 23 10/24/2023 0258   GLUCOSE 160 (H) 10/24/2023 0258   BUN 43 (H) 10/24/2023 0258   CREATININE 1.63 (H) 10/24/2023 0258   CALCIUM  9.1 10/24/2023 0258   PROT 7.1 10/24/2023 0258   ALBUMIN 3.9 10/24/2023 0258   AST 19 10/24/2023 0258   ALT 23 10/24/2023 0258   ALKPHOS 87 10/24/2023 0258   BILITOT 0.4 10/24/2023 0258   GFRNONAA 46 (L) 10/24/2023 0258   GFRAA >60 02/04/2017 0525       Component Value Date/Time   WBC 9.8 10/24/2023 0258   RBC 4.26 10/24/2023 0258   HGB 12.9 (L) 10/24/2023 0258   HCT 39.3 10/24/2023 0258   PLT 174 10/24/2023 0258   MCV 92.3 10/24/2023 0258   MCH 30.3 10/24/2023 0258   MCHC 32.8 10/24/2023 0258   RDW 14.2 10/24/2023 0258   LYMPHSABS 1.3 10/24/2023 0258   MONOABS 0.7 10/24/2023 0258   EOSABS 0.2 10/24/2023 0258   BASOSABS 0.1 10/24/2023 0258    No results found for: POCLITH, LITHIUM   Lab Results  Component Value Date   VALPROATE 35 (L) 07/01/2022    Sleep study dx severe OSA AHI 53.   .res Assessment: Plan:    Refujio was seen today for follow-up, depression and anxiety.  Diagnoses and all orders for this visit:  Bipolar II disorder (HCC)  PTSD (post-traumatic stress  disorder)  Obstructive sleep apnea  Elevated liver enzymes    30 min face to face time with patient was spent on counseling and coordination of care. We discussed  Mood remains stable . Switch from lithium to VPA in 2012 was followed up with CMP which was normal at the time except for elevated ammonia level.  Which was the reason for adding L-carnitine and this corrected the ammonia level.   Elevated liver enzymes resolved.  Having some other health issues.    Recent stressor over the customer who falsely accused him.  This is ongoing.    Good response to the med combination.  Polypharmacy necessary.   Since 2017 on pramipexole  has been much better with regard to mood stability.  He feels it causes weight gain but cannot maintain stability bc of it.  Using O2 consistently HS and he requires less sleep.  Disc sleep hygiene and getting adequate quantity for mental health.  Sleep deprivation increases risk mania.   No med changes indicated.   Sig risk in switching his primary mood stabilizer unless its absolutely nececessary. Depakote  1000, duloxetine  120, lamotrigine  200, pramipexole  0.5 mg BID.  Lyrica  100 mg BID from neuro.  Failed attempt to reduce pramipexole  below 0.5 mg BID Disc risk impulsivity and mania with this but he's had depression benefit without problems.  06/30/22 ED stroke  then had rehab.  Counseling 30 min:  dealing with student's parent with some unexpected conflict.  Counting the cost of his response to being falsely accused with  unknown reasons. Facing court date this Friday.    Elevated liver enzymes resolved.  FU 3-4 weeks  Lorene Macintosh, MD, DFAPA   Please see After Visit Summary for patient specific instructions.  Future Appointments  Date Time Provider Department Center  10/07/2024  8:30 AM Sherryl Bouchard, NP [facility] None     No orders of the defined types were placed in this encounter.      -------------------------------

## 2024-05-27 DIAGNOSIS — M25561 Pain in right knee: Secondary | ICD-10-CM | POA: Diagnosis not present

## 2024-05-27 DIAGNOSIS — M25562 Pain in left knee: Secondary | ICD-10-CM | POA: Diagnosis not present

## 2024-06-01 DIAGNOSIS — M25562 Pain in left knee: Secondary | ICD-10-CM | POA: Diagnosis not present

## 2024-06-01 DIAGNOSIS — M25561 Pain in right knee: Secondary | ICD-10-CM | POA: Diagnosis not present

## 2024-06-02 ENCOUNTER — Ambulatory Visit: Admitting: Psychiatry

## 2024-06-02 DIAGNOSIS — M25562 Pain in left knee: Secondary | ICD-10-CM | POA: Diagnosis not present

## 2024-06-02 DIAGNOSIS — M25561 Pain in right knee: Secondary | ICD-10-CM | POA: Diagnosis not present

## 2024-06-06 DIAGNOSIS — M25561 Pain in right knee: Secondary | ICD-10-CM | POA: Diagnosis not present

## 2024-06-06 DIAGNOSIS — M170 Bilateral primary osteoarthritis of knee: Secondary | ICD-10-CM | POA: Diagnosis not present

## 2024-06-06 DIAGNOSIS — M25562 Pain in left knee: Secondary | ICD-10-CM | POA: Diagnosis not present

## 2024-06-09 DIAGNOSIS — E1122 Type 2 diabetes mellitus with diabetic chronic kidney disease: Secondary | ICD-10-CM | POA: Diagnosis not present

## 2024-06-09 DIAGNOSIS — N1832 Chronic kidney disease, stage 3b: Secondary | ICD-10-CM | POA: Diagnosis not present

## 2024-06-09 DIAGNOSIS — I10 Essential (primary) hypertension: Secondary | ICD-10-CM | POA: Diagnosis not present

## 2024-06-09 DIAGNOSIS — E782 Mixed hyperlipidemia: Secondary | ICD-10-CM | POA: Diagnosis not present

## 2024-06-17 DIAGNOSIS — M25561 Pain in right knee: Secondary | ICD-10-CM | POA: Diagnosis not present

## 2024-06-17 DIAGNOSIS — M25562 Pain in left knee: Secondary | ICD-10-CM | POA: Diagnosis not present

## 2024-06-26 ENCOUNTER — Ambulatory Visit: Admitting: Psychiatry

## 2024-06-26 ENCOUNTER — Encounter: Payer: Self-pay | Admitting: Psychiatry

## 2024-06-26 DIAGNOSIS — F3181 Bipolar II disorder: Secondary | ICD-10-CM

## 2024-06-26 DIAGNOSIS — G4733 Obstructive sleep apnea (adult) (pediatric): Secondary | ICD-10-CM | POA: Diagnosis not present

## 2024-06-26 DIAGNOSIS — F4310 Post-traumatic stress disorder, unspecified: Secondary | ICD-10-CM | POA: Diagnosis not present

## 2024-06-26 NOTE — Progress Notes (Signed)
 Edwin [name] [id number] May 11, 1956 68 y.o.  Subjective:   Patient ID:  Edwin [name] is a 68 y.o. (DOB Apr 30, 1956) male.  Chief Complaint:  Chief Complaint  Patient presents with   Follow-up   Depression   Anxiety   Stress    Edwin [name] presents to the office today for follow-up of bipolar depression and anxiety.  seen July 02, 2019.  No meds were changed.  Patient was doing well.  He asked me to write a letter to his attorney and support his intention to marry a woman from overseas and that his mental health was sufficiently well that he would not represent a danger to his fiance.  This was discussed with his attorney and the letter was written.  Met girl June 8. Met her on dating site international.  [place] woman and decided wanted to get married.  Options opening up after being shut down by Covid.  seen November 2020.  No meds were changed.  He was overall doing well.  seen January 20, 2020 .  No med changed.  Following noted. Wonders about elevated liver enzymes and Depakote .  Labs not visible in Epic.  PCP Dr. Jon Jacob is working up this finding.   Doing great from mental health.  Talks daily to GF in [place].    03/23/20 the following is noted:    Taken on too many students.  Recognizes needs to have 2 days off a week and plans to schedule that.  Needs to get through end of the semester.  Fiance visa is difficult but she just got admitted to [facility] pending some exams being passed.  Visa difficult with Covid affecting travel.  She could be here in mid-July. No med changes  07/06/20 with the following noted: Still good. Pramipexole  has kept depression at bay. Mood is still stable. Good interest in basketball. Still concerned about weight and blames meds and wonders about weight loss meds and supplements.  Asked about L-glutamine taken it for a week.  Trying to watch diet.  Hasn't weighed himself. Plan: No med changes indicated yet.  Sig risk in switching his  primary mood stabilizer unless its absolutely nececessary.  10/05/20 appt noted: Consistent with meds.  Still doing great overall.  Some trouble sleeping and got help with CPAP with Dr. Tammy.  Tendency to sleep 3 hours and awakened.  May stay awake 1-2 hours and eventually get sufficient sleep.  Can awaken refreshed. No SE except weight gain.   Got denied for Ozempic.  Very concerned about weight gain with Depakote . Still facetiming with GF in [place] and no visa for her yet bc country still shut down. Plan no med changes  01/05/2021 appointment with the following noted: Expressed concerns about weight and new dx DM.   Disc retrying efforts to get Ozempic.   Has increase glucose.   Been on phone with GF in [place] for 20 mos.  It's dragging on trying to get her here.  Thinking about going there.   Previously lost the love of his life DT his illness.  Sense of loss bc her birthday was a couple of days ago.    05/03/2021 appointment with the following noted: Doing as well as anyone can be doing.  Flew to [place] in April.  People were nice.  Enjoyed the family and went with 27 people to the beach.  GF comes from family of 9.  Going back in July and wedding July 30.  Had Face Timed for  22 mos and gotten to know the family.  She has not been to [place]  yet but they plan to live here.  Spousal visas are back logged.   Has seen PCP and dietician and is losing weight. Plan: No med changes indicated yet.  Sig risk in switchin g his primary mood stabilizer unless its absolutely nececessary. Failed attempt to reduce pramipexole  below 0.5 mg BID  08/30/21 appt noted: Pretty well.  Got married but she's not here yet.  Working on getting her into the [place] .  Has a good immigration attorney here.  Usually takes a just a couple of mos but maybe longer  DT backlog.   Still taking meds Depakote  1000, duloxetine  120, lamotrigine  200, pramipexole  0.5 mg BID.   Stopped soda.  Working with nutritionist and  down 35#. 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   Disc psychology and masculinity goals. No SE.   Satisfied with meds and no indication to change. Plan: continue Depakote  1000, duloxetine  120, lamotrigine  200, pramipexole  0.5 mg BID.   Failed attempt to reduce pramipexole  below 0.5 mg BID  01/01/22 appt noted: Going to [employer].   Very good mental health.   New wife is delayed getting here bc slow immigration.  She's in [place] right now.   Got married in [place]   OK with meds. CKD stable. Plan: No med changes indicated yet.  Sig risk in switching his primary mood stabilizer unless its absolutely nececessary. Depakote  1000, duloxetine  120, lamotrigine  200, pramipexole  0.5 mg BID.    05/01/2022 appointment with the following noted: March 15 dizzy and dx with stroke.  Thinks it might be related to untreated OSA bc CPAP machine burned up and couldn't get another.  O2 levels low in hospital in sleep. Appt  to see sleep doc soon.  Sleeping with O2 now.   No depression occurred.  Handled it well.  Lost 52# this year. Thankful for meds.   Sees Dr. Gailen 7/6 Applied for expedited Visa for wife. Plan: No med changes indicated.  Sig risk in switching his primary mood stabilizer unless its absolutely nececessary. Depakote  1000, duloxetine  120, lamotrigine  200, pramipexole  0.5 mg BID.  Gabapentin  400 BID Failed attempt to reduce pramipexole  below 0.5 mg BID  09/17/22 appt noted: 06/30/22 ED stroke  then had rehab. Stroke in March mild but not again in 8/23.  Slowly better. Walking 2-4 miles daily and it has helped. Enjoys [employer] sports Patient reports stable mood and denies depressed or irritable moods.   Patient denies any recent difficulty with anxiety.  Patient denies difficulty with sleep initiation or maintenance.  Not Using CPAP some issues getting O2.. Sleep hygiene is better now and that' s helped.  Going to bed earlier.   Denies appetite disturbance.   Patient reports that energy and motivation have been good.  Patient denies any difficulty with concentration.  Patient denies any suicidal ideation. Mood good wihtout swings or depression.   Satisfied with meds. No alcohol.   Plan no med changes from above  01/21/23 appt noted: Continues meds as above.  Tolerating meds except constipation.  Miralax  helping. Been doing good but youngest cat died and he is grieving.  It was his favorite pet.  Didn't have to put her down.  Happened 2 mos ago.  His cat was Louie.  He was a happy cat.   No mood swings.   Been going to [facility] basketball games.   Visa process is going slow to  get sig other (wife) here from [place]. Taking a long time.   No mood swings.    05/27/23 appt noted: Newly married and wife here for 2 days.  4 yr process.  W Sweet.   Enjoys teaching and playing piano.   Playing some at [employer]. Continue meds. No new SE.  ? Wt gain related. Patient reports stable mood and denies depressed or irritable moods.  Patient denies any recent difficulty with anxiety.  Patient denies difficulty with sleep initiation or maintenance. Denies appetite disturbance.  Patient reports that energy and motivation have been good.  Patient denies any difficulty with concentration.  Patient denies any suicidal ideation. Has seen neuro and on gabapentin  and Lyrica .   Still some PT as needed after the stroke.  Watching BP Hard losing another cat in May which mother named.   Plan: No med changes indicated.  Sig risk in switching his primary mood stabilizer unless its absolutely nececessary. Depakote  1000, duloxetine  120, lamotrigine  200, pramipexole  0.5 mg BID.  Still on gabapentin  400 mg BID  09/30/23 appt noted: Health issues since here with cellulitis, sepsis, fall, sciatica.   Lost cat of 17 years.  Got new cats. Married life is an adjustment.  Going well so far.   Switching from gabapentin  to Lyrica  for sciatica and paresthesia hand and  foot. Still playing piano.   No SE with meds unless wt gain.   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 Mood has remained stable.   Able to get by with less sleep than in the past.  Getting 4-5 hours.  Can schedule nap if needed. Rejuvinating.  To sleep quickly.  No insomnia.   No dep .  No swings.  01/29/24 appt noted: Med: Depakote  1000, duloxetine  120, lamotrigine  200, pramipexole  0.5 mg BID.  Switch gabapentin  400 mg BID to Lyrica  75 mg BID for paresthesia.  Helps some.  SE gabapentin  sleepy Mood pretty much the same.   Some stressor.  Has no contact with sister since his mother died.  Unexpected conflict with student's father out of the blue for no reason none.  Resulting in meaningless, unfounded charge of cyber-stalking.  Had to shut down business for 2 weeks the charge came from an a former student who is an Pensions consultant.  Triggered some PTSD for him.   Overall satisfied with meds.  05/26/24 appt noted:  Med: Depakote  1000, duloxetine  120, lamotrigine  200, pramipexole  0.5 mg BID.   Lyrica  100 mg BID for paresthesia.  Helps some. Testosterone  shots for a week.  SE gabapentin  sleepy Business off 30% in last 8 mos. Will start SS next April. Has court date this Friday for false claim of cyberstalking.  Fears trigger of PTSD with the trial.  Has good atty.  Expects dismissal.  W Sweet doing fine.  Is supportive. No concerns with meds.   Says seeing nephro for CKD with last Cr 1.9 Attending [facility].   Neuro RX Lyrica  with some benefit for nerve pain in hands.   06/26/24 appt noted:  Meds as above Very pleased with His attorney: Marylee Axe. Disc legal case over no contact order. It is resolved.  Dismissed and expunged.  But judge would not let him read his statement.  But feels vulnerable to further action from the accusers.  W Sweet is supportive.  Overall managing but wants to know how to handle various situations.  M died 2019-07-17.  Was a relief for  her.  Past Psychiatric Medication Trials: Lithium witch to VPA 2012 DT increased Cr  Depakote  1000, lamotrigine ,  gabapentin , Vraylar tremor, Rexulti SE, Abilify  increased weight,   Wellbutrin NR, phentermine,   Pramipexole  0.5 mg BID  helped mood L-Carnitine  Psych hosp 1986 helpful   Review of Systems:  Review of Systems  Constitutional:  Negative for unexpected weight change.  Cardiovascular:  Negative for palpitations.  Musculoskeletal:  Positive for arthralgias and back pain.  Neurological:  Positive for weakness. Negative for tremors.       Paresthesia in hand  Psychiatric/Behavioral:  Negative for agitation, behavioral problems, confusion, decreased concentration, hallucinations, self-injury, sleep disturbance and suicidal ideas. The patient is not hyperactive.   No longer depressed.   Medications: I have reviewed the patient's current medications.  Current Outpatient Medications  Medication Sig Dispense Refill   amLODipine  (NORVASC ) 5 MG tablet Take 5 mg by mouth daily.     Cholecalciferol  1000 units TBDP Take 1 tablet by mouth daily.      clopidogrel  (PLAVIX ) 75 MG tablet TAKE ONE TABLET BY MOUTH DAILY 90 tablet 1   dapagliflozin  propanediol (FARXIGA ) 10 MG TABS tablet Take 10 mg by mouth daily.     divalproex  (DEPAKOTE  ER) 500 MG 24 hr tablet Take 2 tablets (1,000 mg total) by mouth at bedtime. 60 tablet 5   DULoxetine  (CYMBALTA ) 60 MG capsule Take 2 capsules (120 mg total) by mouth daily. 60 capsule 5   febuxostat  (ULORIC ) 40 MG tablet Take 40 mg by mouth daily.     furosemide  (LASIX ) 20 MG tablet Take 20 mg by mouth every other day.     lamoTRIgine  (LAMICTAL ) 200 MG tablet Take 1 tablet (200 mg total) by mouth every morning. 90 tablet 1   LevOCARNitine  (L-CARNITINE) 500 MG TABS Take 500 mg by mouth 2 (two) times daily.     losartan  (COZAAR ) 50 MG tablet Take 50 mg by mouth daily.     magnesium  oxide (MAG-OX) 400 (240 Mg) MG tablet Take 400 mg by mouth daily.      multivitamin-lutein (OCUVITE-LUTEIN) CAPS capsule Take 1 capsule by mouth daily.     pramipexole  (MIRAPEX ) 0.5 MG tablet Take 1 tablet (0.5 mg total) by mouth 2 (two) times daily. 60 tablet 5   pregabalin  (LYRICA ) 100 MG capsule Take 1 capsule (100 mg total) by mouth 2 (two) times daily. 60 capsule 3   rosuvastatin  (CRESTOR ) 40 MG tablet TAKE ONE TABLET BY MOUTH EVERY MORNING 90 tablet 3   sodium zirconium cyclosilicate (LOKELMA) 10 g PACK packet Take 10 g by mouth.     tamsulosin  (FLOMAX ) 0.4 MG CAPS capsule Take 0.4 mg by mouth daily.     No current facility-administered medications for this visit.    Medication Side Effects: Other: weight gain.  Allergies:  Allergies  Allergen Reactions   Allopurinol Other (See Comments)    Made pt emotionally unstable  Other reaction(s): emotionally labile   Penicillins Hives and Other (See Comments)   Finerenone Other (See Comments)    hyperkalemia Other reaction(s): Increases  potassium levels   Penicillin G     Other reaction(s): hives   Aspirin Hives   Ciprofloxacin Rash    Other reaction(s): rash    Past Medical History:  Diagnosis Date   Allergic rhinitis    Angio-edema    Bipolar disorder (HCC)    Bradycardia 2012    due to lithium   CKD (chronic kidney disease) stage 2, GFR 60-89 ml/min    DM2 (  diabetes mellitus, type 2) (HCC)    Gout    Hypertension    Mild renal insufficiency    , with creatinine of 1.3 in 2010, was side effect of med   Obesity    ,moderate   Prostatitis    , Episodic   Stroke (HCC)    Suicide attempt (HCC) 09/20/2014   Urticaria     Family History  Problem Relation Age of Onset   Other Mother        Viral Meningitis   Mitral valve prolapse Mother    Arrhythmia Mother    Hypothyroidism Mother    Stroke Mother    Hypertension Father    Gout Father    Alzheimer's disease Father     Social History   Socioeconomic History   Marital status: Married    Spouse name: Sweet   Number of  children: 0   Years of education: Not on file   Highest education level: Master's degree (e.g., MA, MS, MEng, MEd, MSW, MBA)  Occupational History   Not on file  Tobacco Use   Smoking status: Never    Passive exposure: Never   Smokeless tobacco: Never  Vaping Use   Vaping status: Never Used  Substance and Sexual Activity   Alcohol use: No   Drug use: No   Sexual activity: Not on file  Other Topics Concern   Not on file  Social History Narrative   Lives alone   Left handed   Caffeine: 2 ice tea a day   Social Drivers of Health   Financial Resource Strain: Low Risk  (02/22/2022)   Overall Financial Resource Strain (CARDIA)    Difficulty of Paying Living Expenses: Not hard at all  Food Insecurity: No Food Insecurity (08/09/2023)   Hunger Vital Sign    Worried About Running Out of Food in the Last Year: Never true    Ran Out of Food in the Last Year: Never true  Transportation Needs: No Transportation Needs (08/09/2023)   PRAPARE - Administrator, Civil Service (Medical): No    Lack of Transportation (Non-Medical): No  Physical Activity: Not on file  Stress: No Stress Concern Present (02/22/2022)   [employer] of Occupational Health - Occupational Stress Questionnaire    Feeling of Stress : Only a little  Social Connections: Unknown (04/03/2022)   Received from [facility]   Social Network    Social Network: Not on file  Intimate Partner Violence: Not At Risk (08/09/2023)   Humiliation, Afraid, Rape, and Kick questionnaire    Fear of Current or Ex-Partner: No    Emotionally Abused: No    Physically Abused: No    Sexually Abused: No    Past Medical History, Surgical history, Social history, and Family history were reviewed and updated as appropriate.   Please see review of systems for further details on the patient's review from today.   Objective:   Physical Exam:  There were no vitals taken for this visit.  Physical Exam Constitutional:       General: He is not in acute distress.    Appearance: He is well-developed.  Musculoskeletal:        General: No deformity.  Neurological:     Mental Status: He is alert and oriented to person, place, and time.     Motor: No tremor.     Coordination: Coordination normal.     Gait: Gait normal.  Psychiatric:        Attention  and Perception: Attention normal. He is attentive.        Mood and Affect: Mood is not anxious or depressed. Affect is not labile, blunt or inappropriate.        Speech: Speech is not rapid and pressured or slurred.        Behavior: Behavior normal.        Thought Content: Thought content normal. Thought content is not delusional. Thought content does not include homicidal or suicidal ideation. Thought content does not include suicidal plan.        Cognition and Memory: Cognition normal.        Judgment: Judgment normal.     Comments: Insight is good.  Some situational anxiety managed Talkative and pleasant. Recent sig stressors but handling it ok.       Lab Review:     Component Value Date/Time   NA 141 10/24/2023 0258   K 4.4 10/24/2023 0258   CL 108 10/24/2023 0258   CO2 23 10/24/2023 0258   GLUCOSE 160 (H) 10/24/2023 0258   BUN 43 (H) 10/24/2023 0258   CREATININE 1.63 (H) 10/24/2023 0258   CALCIUM  9.1 10/24/2023 0258   PROT 7.1 10/24/2023 0258   ALBUMIN 3.9 10/24/2023 0258   AST 19 10/24/2023 0258   ALT 23 10/24/2023 0258   ALKPHOS 87 10/24/2023 0258   BILITOT 0.4 10/24/2023 0258   GFRNONAA 46 (L) 10/24/2023 0258   GFRAA >60 02/04/2017 0525       Component Value Date/Time   WBC 9.8 10/24/2023 0258   RBC 4.26 10/24/2023 0258   HGB 12.9 (L) 10/24/2023 0258   HCT 39.3 10/24/2023 0258   PLT 174 10/24/2023 0258   MCV 92.3 10/24/2023 0258   MCH 30.3 10/24/2023 0258   MCHC 32.8 10/24/2023 0258   RDW 14.2 10/24/2023 0258   LYMPHSABS 1.3 10/24/2023 0258   MONOABS 0.7 10/24/2023 0258   EOSABS 0.2 10/24/2023 0258   BASOSABS 0.1 10/24/2023 0258     No results found for: POCLITH, LITHIUM   Lab Results  Component Value Date   VALPROATE 35 (L) 07/01/2022    Sleep study dx severe OSA AHI 53.   .res Assessment: Plan:    Verlie was seen today for follow-up, depression, anxiety and stress.  Diagnoses and all orders for this visit:  Bipolar II disorder (HCC)  PTSD (post-traumatic stress disorder)  Obstructive sleep apnea   face to face time with patient was spent on counseling and coordination of care. We discussed  Mood remains stable . Switch from lithium to VPA in 2012 was followed up with CMP which was normal at the time except for elevated ammonia level.  Which was the reason for adding L-carnitine and this corrected the ammonia level.   Elevated liver enzymes resolved.  Having some other health issues.    Recent stressor over the customer who falsely accused him.  This is ongoing.    Good response to the med combination.  Polypharmacy necessary.   Since 2017 on pramipexole  has been much better with regard to mood stability.  He feels it causes weight gain but cannot maintain stability bc of it.  Using O2 consistently HS and he requires less sleep.  Disc sleep hygiene and getting adequate quantity for mental health.  Sleep deprivation increases risk mania.   No med changes indicated.   Sig risk in switching his primary mood stabilizer unless its absolutely nececessary. Depakote  1000, duloxetine  120, lamotrigine  200, pramipexole  0.5 mg BID.  Lyrica  100  mg BID from neuro.  Failed attempt to reduce pramipexole  below 0.5 mg BID Disc risk impulsivity and mania with this but he's had depression benefit without problems.  06/30/22 ED stroke  then had rehab.  Counseling 30 min:  dealing with student's parent with some unexpected conflict.  Then the court date just recently with a dismissal and expungement.  But was traumatic dealing with it as an abuse of power.  Counting the cost of his response to being falsely accused  with unknown reasons.   Elevated liver enzymes resolved.  FU 2 mos  Lorene Macintosh, MD, DFAPA   Please see After Visit Summary for patient specific instructions.  Future Appointments  Date Time Provider Department Center  08/13/2024  9:30 AM Cottle, Lorene KANDICE Raddle., MD [facility] None  10/07/2024  8:30 AM Sherryl Bouchard, NP [facility] None     No orders of the defined types were placed in this encounter.      -------------------------------

## 2024-06-30 ENCOUNTER — Other Ambulatory Visit: Payer: Self-pay | Admitting: Neurology

## 2024-06-30 DIAGNOSIS — N183 Chronic kidney disease, stage 3 unspecified: Secondary | ICD-10-CM | POA: Diagnosis not present

## 2024-07-06 ENCOUNTER — Encounter: Payer: Self-pay | Admitting: Adult Health

## 2024-07-07 MED ORDER — PREGABALIN 25 MG PO CAPS: ORAL_CAPSULE | ORAL | 0 refills | Status: DC

## 2024-07-09 DIAGNOSIS — M25561 Pain in right knee: Secondary | ICD-10-CM | POA: Diagnosis not present

## 2024-07-09 DIAGNOSIS — N1832 Chronic kidney disease, stage 3b: Secondary | ICD-10-CM | POA: Diagnosis not present

## 2024-07-09 DIAGNOSIS — I129 Hypertensive chronic kidney disease with stage 1 through stage 4 chronic kidney disease, or unspecified chronic kidney disease: Secondary | ICD-10-CM | POA: Diagnosis not present

## 2024-07-09 DIAGNOSIS — D631 Anemia in chronic kidney disease: Secondary | ICD-10-CM | POA: Diagnosis not present

## 2024-07-09 DIAGNOSIS — E1122 Type 2 diabetes mellitus with diabetic chronic kidney disease: Secondary | ICD-10-CM | POA: Diagnosis not present

## 2024-07-17 DIAGNOSIS — M1711 Unilateral primary osteoarthritis, right knee: Secondary | ICD-10-CM | POA: Diagnosis not present

## 2024-07-17 DIAGNOSIS — M170 Bilateral primary osteoarthritis of knee: Secondary | ICD-10-CM | POA: Diagnosis not present

## 2024-08-03 DIAGNOSIS — M25561 Pain in right knee: Secondary | ICD-10-CM | POA: Diagnosis not present

## 2024-08-03 DIAGNOSIS — M25562 Pain in left knee: Secondary | ICD-10-CM | POA: Diagnosis not present

## 2024-08-03 DIAGNOSIS — M1991 Primary osteoarthritis, unspecified site: Secondary | ICD-10-CM | POA: Diagnosis not present

## 2024-08-03 DIAGNOSIS — M1009 Idiopathic gout, multiple sites: Secondary | ICD-10-CM | POA: Diagnosis not present

## 2024-08-13 ENCOUNTER — Ambulatory Visit: Admitting: Psychiatry

## 2024-08-13 ENCOUNTER — Encounter: Payer: Self-pay | Admitting: Psychiatry

## 2024-08-13 DIAGNOSIS — F4310 Post-traumatic stress disorder, unspecified: Secondary | ICD-10-CM | POA: Diagnosis not present

## 2024-08-13 DIAGNOSIS — F3181 Bipolar II disorder: Secondary | ICD-10-CM

## 2024-08-13 DIAGNOSIS — G4733 Obstructive sleep apnea (adult) (pediatric): Secondary | ICD-10-CM | POA: Diagnosis not present

## 2024-08-13 NOTE — Progress Notes (Signed)
 Edwin [name] [id number] December 17, 1955 68 y.o.  Subjective:   Patient ID:  Edwin [name] is a 68 y.o. (DOB [birth year]) male.  Chief Complaint:  Chief Complaint  Patient presents with   Follow-up   Depression   Anxiety   Stress    Edwin BARTLESON presents to the office today for follow-up of bipolar depression and anxiety.  seen July 02, 2019.  No meds were changed.  Patient was doing well.  He asked me to write a letter to his attorney and support his intention to marry a woman from overseas and that his mental health was sufficiently well that he would not represent a danger to his fiance.  This was discussed with his attorney and the letter was written.  Met girl June 8. Met her on dating site international.  [place] woman and decided wanted to get married.  Options opening up after being shut down by Covid.  seen November 2020.  No meds were changed.  He was overall doing well.  seen January 20, 2020 .  No med changed.  Following noted. Wonders about elevated liver enzymes and Depakote .  Labs not visible in Epic.  PCP Dr. Jon Jacob is working up this finding.   Doing great from mental health.  Talks daily to GF in [place].    03/23/20 the following is noted:    Taken on too many students.  Recognizes needs to have 2 days off a week and plans to schedule that.  Needs to get through end of the semester.  Fiance visa is difficult but she just got admitted to [facility] pending some exams being passed.  Visa difficult with Covid affecting travel.  She could be here in mid-July. No med changes  07/06/20 with the following noted: Still good. Pramipexole  has kept depression at bay. Mood is still stable. Good interest in basketball. Still concerned about weight and blames meds and wonders about weight loss meds and supplements.  Asked about L-glutamine taken it for a week.  Trying to watch diet.  Hasn't weighed himself. Plan: No med changes indicated yet.  Sig risk in switching his  primary mood stabilizer unless its absolutely nececessary.  10/05/20 appt noted: Consistent with meds.  Still doing great overall.  Some trouble sleeping and got help with CPAP with Dr. Tammy.  Tendency to sleep 3 hours and awakened.  May stay awake 1-2 hours and eventually get sufficient sleep.  Can awaken refreshed. No SE except weight gain.   Got denied for Ozempic.  Very concerned about weight gain with Depakote . Still facetiming with GF in [place] and no visa for her yet bc country still shut down. Plan no med changes  01/05/2021 appointment with the following noted: Expressed concerns about weight and new dx DM.   Disc retrying efforts to get Ozempic.   Has increase glucose.   Been on phone with GF in [place] for 20 mos.  It's dragging on trying to get her here.  Thinking about going there.   Previously lost the love of his life DT his illness.  Sense of loss bc her birthday was a couple of days ago.    05/03/2021 appointment with the following noted: Doing as well as anyone can be doing.  Flew to [place] in April.  People were nice.  Enjoyed the family and went with 27 people to the beach.  GF comes from family of 9.  Going back in July and wedding July 30.  Had Face Timed for  22 mos and gotten to know the family.  She has not been to [place]  yet but they plan to live here.  Spousal visas are back logged.   Has seen PCP and dietician and is losing weight. Plan: No med changes indicated yet.  Sig risk in switchin g his primary mood stabilizer unless its absolutely nececessary. Failed attempt to reduce pramipexole  below 0.5 mg BID  08/30/21 appt noted: Pretty well.  Got married but she's not here yet.  Working on getting her into the [place] .  Has a good immigration attorney here.  Usually takes a just a couple of mos but maybe longer  DT backlog.   Still taking meds Depakote  1000, duloxetine  120, lamotrigine  200, pramipexole  0.5 mg BID.   Stopped soda.  Working with nutritionist and  down 35#. 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   Disc psychology and masculinity goals. No SE.   Satisfied with meds and no indication to change. Plan: continue Depakote  1000, duloxetine  120, lamotrigine  200, pramipexole  0.5 mg BID.   Failed attempt to reduce pramipexole  below 0.5 mg BID  01/01/22 appt noted: Going to [employer].   Very good mental health.   New wife is delayed getting here bc slow immigration.  She's in [place] right now.   Got married in [place]   OK with meds. CKD stable. Plan: No med changes indicated yet.  Sig risk in switching his primary mood stabilizer unless its absolutely nececessary. Depakote  1000, duloxetine  120, lamotrigine  200, pramipexole  0.5 mg BID.    05/01/2022 appointment with the following noted: March 15 dizzy and dx with stroke.  Thinks it might be related to untreated OSA bc CPAP machine burned up and couldn't get another.  O2 levels low in hospital in sleep. Appt  to see sleep doc soon.  Sleeping with O2 now.   No depression occurred.  Handled it well.  Lost 52# this year. Thankful for meds.   Sees Dr. Gailen 7/6 Applied for expedited Visa for wife. Plan: No med changes indicated.  Sig risk in switching his primary mood stabilizer unless its absolutely nececessary. Depakote  1000, duloxetine  120, lamotrigine  200, pramipexole  0.5 mg BID.  Gabapentin  400 BID Failed attempt to reduce pramipexole  below 0.5 mg BID  09/17/22 appt noted: 06/30/22 ED stroke  then had rehab. Stroke in March mild but not again in 8/23.  Slowly better. Walking 2-4 miles daily and it has helped. Enjoys [employer] sports Patient reports stable mood and denies depressed or irritable moods.   Patient denies any recent difficulty with anxiety.  Patient denies difficulty with sleep initiation or maintenance.  Not Using CPAP some issues getting O2.. Sleep hygiene is better now and that' s helped.  Going to bed earlier.   Denies appetite disturbance.   Patient reports that energy and motivation have been good.  Patient denies any difficulty with concentration.  Patient denies any suicidal ideation. Mood good wihtout swings or depression.   Satisfied with meds. No alcohol.   Plan no med changes from above  01/21/23 appt noted: Continues meds as above.  Tolerating meds except constipation.  Miralax  helping. Been doing good but youngest cat died and he is grieving.  It was his favorite pet.  Didn't have to put her down.  Happened 2 mos ago.  His cat was Louie.  He was a happy cat.   No mood swings.   Been going to [facility] basketball games.   Visa process is going slow to  get sig other (wife) here from [place]. Taking a long time.   No mood swings.    05/27/23 appt noted: Newly married and wife here for 2 days.  4 yr process.  W Sweet.   Enjoys teaching and playing piano.   Playing some at [employer]. Continue meds. No new SE.  ? Wt gain related. Patient reports stable mood and denies depressed or irritable moods.  Patient denies any recent difficulty with anxiety.  Patient denies difficulty with sleep initiation or maintenance. Denies appetite disturbance.  Patient reports that energy and motivation have been good.  Patient denies any difficulty with concentration.  Patient denies any suicidal ideation. Has seen neuro and on gabapentin  and Lyrica .   Still some PT as needed after the stroke.  Watching BP Hard losing another cat in May which mother named.   Plan: No med changes indicated.  Sig risk in switching his primary mood stabilizer unless its absolutely nececessary. Depakote  1000, duloxetine  120, lamotrigine  200, pramipexole  0.5 mg BID.  Still on gabapentin  400 mg BID  09/30/23 appt noted: Health issues since here with cellulitis, sepsis, fall, sciatica.   Lost cat of 17 years.  Got new cats. Married life is an adjustment.  Going well so far.   Switching from gabapentin  to Lyrica  for sciatica and paresthesia hand and  foot. Still playing piano.   No SE with meds unless wt gain.   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 Mood has remained stable.   Able to get by with less sleep than in the past.  Getting 4-5 hours.  Can schedule nap if needed. Rejuvinating.  To sleep quickly.  No insomnia.   No dep .  No swings.  01/29/24 appt noted: Med: Depakote  1000, duloxetine  120, lamotrigine  200, pramipexole  0.5 mg BID.  Switch gabapentin  400 mg BID to Lyrica  75 mg BID for paresthesia.  Helps some.  SE gabapentin  sleepy Mood pretty much the same.   Some stressor.  Has no contact with sister since his mother died.  Unexpected conflict with student's father out of the blue for no reason none.  Resulting in meaningless, unfounded charge of cyber-stalking.  Had to shut down business for 2 weeks the charge came from an a former student who is an Pensions consultant.  Triggered some PTSD for him.   Overall satisfied with meds.  05/26/24 appt noted:  Med: Depakote  1000, duloxetine  120, lamotrigine  200, pramipexole  0.5 mg BID.   Lyrica  100 mg BID for paresthesia.  Helps some. Testosterone  shots for a week.  SE gabapentin  sleepy Business off 30% in last 8 mos. Will start SS next April. Has court date this Friday for false claim of cyberstalking.  Fears trigger of PTSD with the trial.  Has good atty.  Expects dismissal.  W Sweet doing fine.  Is supportive. No concerns with meds.   Says seeing nephro for CKD with last Cr 1.9 Attending [facility].   Neuro RX Lyrica  with some benefit for nerve pain in hands.   06/26/24 appt noted:  Meds as above Very pleased with His attorney: Marylee Axe. Disc legal case over no contact order. It is resolved.  Dismissed and expunged.  But judge would not let him read his statement.  But feels vulnerable to further action from the accusers.  W Sweet is supportive.  Overall managing but wants to know how to handle various situations.  08/13/24 appt noted:  Med: Depakote   1000, duloxetine  120, lamotrigine   200, pramipexole  0.5 mg BID.   Lyrica  100 mg BID for paresthesia.  Helps some. Testosterone  shots for a week.  He feels some better.  Wrote out letter to [facility] to complain about the bullying he felt from the attorney who took him to court.   Overall feels calmer.  Some concern about recent sociopolitical events with public violence.  I carry a tremendous amount of guilt. Considering adoption o fchild familiar to wife Sweet from [place].    M died 13-Aug-2019.  Was a relief for her.    Past Psychiatric Medication Trials: Lithium witch to VPA 2012 DT increased Cr  Depakote  1000, lamotrigine ,  gabapentin , Vraylar tremor, Rexulti SE, Abilify  increased weight,   Wellbutrin NR, phentermine,   Pramipexole  0.5 mg BID  helped mood L-Carnitine  Psych hosp 1986 helpful   Review of Systems:  Review of Systems  Constitutional:  Negative for unexpected weight change.  Cardiovascular:  Negative for palpitations.  Musculoskeletal:  Positive for arthralgias and back pain.  Neurological:  Positive for weakness. Negative for tremors.       Paresthesia in hand  Psychiatric/Behavioral:  Negative for agitation, behavioral problems, confusion, decreased concentration, hallucinations, self-injury, sleep disturbance and suicidal ideas. The patient is not hyperactive.   No longer depressed.   Medications: I have reviewed the patient's current medications.  Current Outpatient Medications  Medication Sig Dispense Refill   amLODipine  (NORVASC ) 5 MG tablet Take 5 mg by mouth daily.     Cholecalciferol  1000 units TBDP Take 1 tablet by mouth daily.      clopidogrel  (PLAVIX ) 75 MG tablet TAKE ONE TABLET BY MOUTH DAILY 90 tablet 1   dapagliflozin  propanediol (FARXIGA ) 10 MG TABS tablet Take 10 mg by mouth daily.     divalproex  (DEPAKOTE  ER) 500 MG 24 hr tablet Take 2 tablets (1,000 mg total) by mouth at bedtime. 60 tablet 5   DULoxetine  (CYMBALTA ) 60 MG capsule Take 2 capsules  (120 mg total) by mouth daily. 60 capsule 5   febuxostat  (ULORIC ) 40 MG tablet Take 40 mg by mouth daily.     furosemide  (LASIX ) 20 MG tablet Take 20 mg by mouth every other day.     lamoTRIgine  (LAMICTAL ) 200 MG tablet Take 1 tablet (200 mg total) by mouth every morning. 90 tablet 1   LevOCARNitine  (L-CARNITINE) 500 MG TABS Take 500 mg by mouth 2 (two) times daily.     losartan  (COZAAR ) 50 MG tablet Take 50 mg by mouth daily.     magnesium  oxide (MAG-OX) 400 (240 Mg) MG tablet Take 400 mg by mouth daily.     multivitamin-lutein (OCUVITE-LUTEIN) CAPS capsule Take 1 capsule by mouth daily.     pramipexole  (MIRAPEX ) 0.5 MG tablet Take 1 tablet (0.5 mg total) by mouth 2 (two) times daily. 60 tablet 5   pregabalin  (LYRICA ) 25 MG capsule Week 1: take 75 mg (3 tabs) In the AM and PM. Week 2: take 50 mg (2 tabs) in the AM and PM. Week 3:  take 25 mg (1tab) in the AM and PM. Then stop medication 84 capsule 0   rosuvastatin  (CRESTOR ) 40 MG tablet TAKE ONE TABLET BY MOUTH EVERY MORNING 90 tablet 3   sodium zirconium cyclosilicate (LOKELMA) 10 g PACK packet Take 10 g by mouth.     tamsulosin  (FLOMAX ) 0.4 MG CAPS capsule Take 0.4 mg by mouth daily.     No current facility-administered medications for this visit.    Medication Side Effects: Other: weight gain.  Allergies:  Allergies  Allergen Reactions   Allopurinol Other (See Comments)    Made pt emotionally unstable  Other reaction(s): emotionally labile   Penicillins Hives and Other (See Comments)   Finerenone Other (See Comments)    hyperkalemia Other reaction(s): Increases  potassium levels   Penicillin G     Other reaction(s): hives   Aspirin Hives   Ciprofloxacin Rash    Other reaction(s): rash    Past Medical History:  Diagnosis Date   Allergic rhinitis    Angio-edema    Bipolar disorder (HCC)    Bradycardia 2012    due to lithium   CKD (chronic kidney disease) stage 2, GFR 60-89 ml/min    DM2 (diabetes mellitus, type 2) (HCC)     Gout    Hypertension    Mild renal insufficiency    , with creatinine of 1.3 in 2010, was side effect of med   Obesity    ,moderate   Prostatitis    , Episodic   Stroke (HCC)    Suicide attempt (HCC) 09/20/2014   Urticaria     Family History  Problem Relation Age of Onset   Other Mother        Viral Meningitis   Mitral valve prolapse Mother    Arrhythmia Mother    Hypothyroidism Mother    Stroke Mother    Hypertension Father    Gout Father    Alzheimer's disease Father     Social History   Socioeconomic History   Marital status: Married    Spouse name: Sweet   Number of children: 0   Years of education: Not on file   Highest education level: Master's degree (e.g., MA, MS, MEng, MEd, MSW, MBA)  Occupational History   Not on file  Tobacco Use   Smoking status: Never    Passive exposure: Never   Smokeless tobacco: Never  Vaping Use   Vaping status: Never Used  Substance and Sexual Activity   Alcohol use: No   Drug use: No   Sexual activity: Not on file  Other Topics Concern   Not on file  Social History Narrative   Lives alone   Left handed   Caffeine: 2 ice tea a day   Social Drivers of Health   Financial Resource Strain: Low Risk  (02/22/2022)   Overall Financial Resource Strain (CARDIA)    Difficulty of Paying Living Expenses: Not hard at all  Food Insecurity: No Food Insecurity (08/09/2023)   Hunger Vital Sign    Worried About Running Out of Food in the Last Year: Never true    Ran Out of Food in the Last Year: Never true  Transportation Needs: No Transportation Needs (08/09/2023)   PRAPARE - Administrator, Civil Service (Medical): No    Lack of Transportation (Non-Medical): No  Physical Activity: Not on file  Stress: No Stress Concern Present (02/22/2022)   [employer] of Occupational Health - Occupational Stress Questionnaire    Feeling of Stress : Only a little  Social Connections: Unknown (04/03/2022)   [facility]   Social Network    Social Network: Not on file  Intimate Partner Violence: Not At Risk (08/09/2023)   Humiliation, Afraid, Rape, and Kick questionnaire    Fear of Current or Ex-Partner: No    Emotionally Abused: No    Physically Abused: No    Sexually Abused: No    Past Medical History, Surgical history, Social history, and Family history  were reviewed and updated as appropriate.   Please see review of systems for further details on the patient's review from today.   Objective:   Physical Exam:  There were no vitals taken for this visit.  Physical Exam Constitutional:      General: He is not in acute distress.    Appearance: He is well-developed.  Musculoskeletal:        General: No deformity.  Neurological:     Mental Status: He is alert and oriented to person, place, and time.     Motor: No tremor.     Coordination: Coordination normal.     Gait: Gait normal.  Psychiatric:        Attention and Perception: Attention normal. He is attentive.        Mood and Affect: Mood is not anxious or depressed. Affect is not labile, blunt or inappropriate.        Speech: Speech is not rapid and pressured or slurred.        Behavior: Behavior normal.        Thought Content: Thought content normal. Thought content is not delusional. Thought content does not include homicidal or suicidal ideation. Thought content does not include suicidal plan.        Cognition and Memory: Cognition normal.        Judgment: Judgment normal.     Comments: Insight is good.  Some situational anxiety managed Talkative and pleasant. Dealing with some grief issues      Lab Review:     Component Value Date/Time   NA 141 10/24/2023 0258   K 4.4 10/24/2023 0258   CL 108 10/24/2023 0258   CO2 23 10/24/2023 0258   GLUCOSE 160 (H) 10/24/2023 0258   BUN 43 (H) 10/24/2023 0258   CREATININE 1.63 (H) 10/24/2023 0258   CALCIUM  9.1 10/24/2023 0258   PROT 7.1 10/24/2023 0258   ALBUMIN 3.9 10/24/2023  0258   AST 19 10/24/2023 0258   ALT 23 10/24/2023 0258   ALKPHOS 87 10/24/2023 0258   BILITOT 0.4 10/24/2023 0258   GFRNONAA 46 (L) 10/24/2023 0258   GFRAA >60 02/04/2017 0525       Component Value Date/Time   WBC 9.8 10/24/2023 0258   RBC 4.26 10/24/2023 0258   HGB 12.9 (L) 10/24/2023 0258   HCT 39.3 10/24/2023 0258   PLT 174 10/24/2023 0258   MCV 92.3 10/24/2023 0258   MCH 30.3 10/24/2023 0258   MCHC 32.8 10/24/2023 0258   RDW 14.2 10/24/2023 0258   LYMPHSABS 1.3 10/24/2023 0258   MONOABS 0.7 10/24/2023 0258   EOSABS 0.2 10/24/2023 0258   BASOSABS 0.1 10/24/2023 0258    No results found for: POCLITH, LITHIUM   Lab Results  Component Value Date   VALPROATE 35 (L) 07/01/2022    Sleep study dx severe OSA AHI 53.   .res Assessment: Plan:    Verbon was seen today for follow-up, depression, anxiety and stress.  Diagnoses and all orders for this visit:  Bipolar II disorder (HCC)  PTSD (post-traumatic stress disorder)  Obstructive sleep apnea    face to face time with patient .We discussed  Mood remains stable but dealing with some grief issues.    Switch from lithium to VPA in 2012 was followed up with CMP which was normal at the time except for elevated ammonia level.  Which was the reason for adding L-carnitine and this corrected the ammonia level.   Elevated liver enzymes resolved.  Having some  other health issues.    Recent stressor over the customer who falsely accused him.  This is ongoing.    Good response to the med combination.  Polypharmacy necessary.   Since 2017 on pramipexole  has been much better with regard to mood stability.  He feels it causes weight gain but cannot maintain stability bc of it.  Using O2 consistently HS and he requires less sleep.  Disc sleep hygiene and getting adequate quantity for mental health.  Sleep deprivation increases risk mania.   No med changes indicated.   Sig risk in switching his primary mood stabilizer  unless its absolutely nececessary. Depakote  1000, duloxetine  120, lamotrigine  200, pramipexole  0.5 mg BID.  Lyrica  100 mg BID from neuro.  Failed attempt to reduce pramipexole  below 0.5 mg BID Disc risk impulsivity and mania with this but he's had depression benefit without problems.  06/30/22 ED stroke  then had rehab.  Counseling 30 min:  re: some chronic guilt feelings from early in college.  And how faith is a potential help which he could more fully embrace.  There were issues in which he felt mistreated leading him to make some bad decisions at that time.  This was a nagging emotional problems for many years.  Disc nature of relationship development normally.  Had I not been ill I could have let go. Disc dealing with grief and processing emotions around loss.  FU 3-4 mos  Lorene Macintosh, MD, DFAPA   Please see After Visit Summary for patient specific instructions.  Future Appointments  Date Time Provider Department Center  10/07/2024  8:30 AM Sherryl Bouchard, NP [facility] None     No orders of the defined types were placed in this encounter.      -------------------------------

## 2024-09-03 DIAGNOSIS — M25562 Pain in left knee: Secondary | ICD-10-CM | POA: Diagnosis not present

## 2024-09-04 DIAGNOSIS — M25562 Pain in left knee: Secondary | ICD-10-CM | POA: Diagnosis not present

## 2024-09-19 ENCOUNTER — Other Ambulatory Visit: Payer: Self-pay | Admitting: Psychiatry

## 2024-09-19 DIAGNOSIS — F3181 Bipolar II disorder: Secondary | ICD-10-CM

## 2024-09-23 ENCOUNTER — Other Ambulatory Visit: Payer: Self-pay | Admitting: Neurology

## 2024-09-24 NOTE — Telephone Encounter (Signed)
 Last seen on 03/20/24 Follow up scheduled on 10/07/24

## 2024-10-06 NOTE — Progress Notes (Unsigned)
 PATIENT: Edwin [name] DOB: 18-Aug-1956  REASON FOR VISIT: follow up HISTORY FROM: patient PRIMARY NEUROLOGIST: Dr. Rosemarie  No chief complaint on file.    HISTORY OF PRESENT ILLNESS: Today 10/06/24:  Edwin [name] is a 68 y.o. male with a history of stroke and paresthesias in the upper extremities. Returns today for follow-up.  Patient just followed up with Dr. Rosemarie at the beginning of April.  Nerve conduction studies with EMG was ordered and the patient was instructed to increase Lyrica  to 100 mg twice a day.  He states that he has been taking 75 mg twice a day.  He did not think Dr. Rosemarie had sent in a new prescription.  I reassured him that the prescription was sent in on April 3.  Patient is also concerned that Lyrica  may be contributing to erectile dysfunction.  States that he just recently got married and that has been an issue. Reports that he has been to urology in the past and has tried Cialis and viagra   Update/01/2024.  Patient is referred back today for evaluation by her primary physician for numbness in his hands.  Patient had complained of postop paresthesias with mostly left hand numbness at last visit.  Ernisha Sorn had recommended reducing and stopping the gabapentin  and increasing the Lyrica .  Patient is on Lyrica  100 mg daily which helped somewhat but he is concerned that for the last few months he has noticed intermittent paresthesias in his right hand as well.  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  She states the paresthesias are present throughout the day.  They do not necessarily wake him up from sleep.  He has not been wearing any wrist splints.  He is tolerating Lyrica  well without side effects.  He denies any recurrent stroke or TIA symptoms.  He remains on Plavix  she is tolerating well without bleeding or bruising.  He is tolerating Crestor  well without muscle aches and pains.  He is not sure when he had his last lipid profile checked.  His  blood pressure is under good control.    09/03/23: Edwin [name] is a 68 y.o. male who has been followed in this office for history of stroke with residual paresthesias in the left upper extremity.Returns today for follow-up. Patient has remained on Lyrica  and gabapentin . Through a mychart visit- I instructed him how to wean off Gabapentin . However, he states that he thought he was suppose to stay on it. No longer complaining of drowsiness. Still having burning and tingling in the LUE.   HISTORY 11/29/22:  Edwin [name] is a 68 year old male with a history of stroke with residual paresthesias in the left upper extremity.  He returns today to discuss his medication.  Reports that he is still having tingling in that hand.  Has gotten some relief with gabapentin .  Has tried doing gabapentin  400 mg 3 times a day but this caused too much drowsiness.  He is now taking 400 twice a day.  He reports that the tingling does interfere with his daily life as he is a engineer, agricultural.  HISTORY 10/02/22: Edwin [name] is a 68 year old male with a history of stroke with residual paresthesias in the left upper extremity.  He returns today for follow-up.  He is currently on gabapentin  taking 400 mg twice a day. Tingling down the arm comes and goes. Reports tingling in the back of the left leg as well. Doesn't notice  when he is busy but bothersome at other times.  Symptoms were not there prior to his stroke but has remained since history.  He returns today for evaluation     06/20/22: Edwin [name] is a 68 year old male with a history of stroke with residual paresthesia.  He returns today for follow-up. Patient Santina to the ED on July 15 with left arm and leg numbness.  The patient had residual numbness in the left arm and leg from his prior stroke but felt that it is gotten worse.  Work-up in the ED was relatively unremarkable.  He was started on Topamax  for paresthesias but feels that this made it worse.  He returns today to discuss  another medication to try. He describes it has pins and needles sensation in the left arm and leg- not as much numbness. He teaches piano.    Reports that he does feel that is balance is off. Tends to veer to the left when ambulating. Feels like he is going to fall but fortunately has not had any falls.  Reports that when he was completing physical therapy he did find it beneficial.  Would like to have another round of physical therapy.   03/22/22: Edwin [name] is a 68 year old male who presented to the ED on March 19 with left-sided sensory deficit and difficulty with ambulation.  He did not receive TNKase due to being out of the window.   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 CT head - 02/04/2022 - No acute intracranial process.  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  Continue Plavix  on discharge   Patient reports that he is doing well. Continues to have some tingling on the left side of the body.  Feels it down the arm and leg. Did not complete any therapy. Reports that he had blood work through PCP and reports that LDL is better and is no longer Crestor . States that it caused fatigue and muscle pain.    Reports that he is monitoring diet and sees a nutritionist   OSA: was using it but then went on a trip and the machine adaptor was broken and was not able to get a replacement CPAP. Was seeing Dr. Tammy.  States that he struggled using the CPAP.  Reports that he would wake up every hour.  Reports that he tried different mask with no benefit.   Reports that he is interested in inspire but Dr. Tammy does not do this.  He has not seen Dr. Tammy in the last year.  He feels that his last sleep study was in 2021 he is requesting to see our sleep physicians here to see if he is a candidate for possible inspire device   REVIEW OF SYSTEMS: Out of a complete 14 system review of symptoms, the patient complains only of the following symptoms, and all other reviewed systems are negative.  ALLERGIES: Allergies  Allergen Reactions   Allopurinol Other (See Comments)    Made pt emotionally unstable  Other reaction(s): emotionally labile  Penicillins Hives and Other (See Comments)   Finerenone Other (See Comments)    hyperkalemia Other reaction(s): Increases  potassium levels   Penicillin G     Other reaction(s): hives   Aspirin Hives   Ciprofloxacin Rash    Other reaction(s): rash    HOME MEDICATIONS: Outpatient Medications Prior to Visit  Medication Sig Dispense Refill   amLODipine  (NORVASC ) 5 MG tablet Take 5 mg by mouth daily.     Cholecalciferol  1000 units TBDP Take 1 tablet by mouth daily.      clopidogrel  (PLAVIX ) 75 MG tablet TAKE ONE TABLET BY MOUTH ONCE DAILY 90 tablet 0   dapagliflozin  propanediol (FARXIGA ) 10 MG TABS tablet Take 10 mg by mouth daily.     divalproex  (DEPAKOTE  ER) 500 MG 24 hr tablet Take 2 tablets (1,000 mg total) by mouth at bedtime. 60 tablet 5   DULoxetine  (CYMBALTA ) 60 MG capsule Take 2 capsules (120 mg total) by mouth daily. 60 capsule 5   febuxostat  (ULORIC ) 40 MG tablet Take 40 mg by mouth daily.     furosemide  (LASIX ) 20 MG tablet Take 20 mg by mouth every other day.     lamoTRIgine  (LAMICTAL ) 200 MG tablet TAKE ONE TABLET BY MOUTH IN THE MORNING 90 tablet 1   LevOCARNitine  (L-CARNITINE) 500 MG TABS Take 500 mg by mouth 2 (two) times daily.     losartan  (COZAAR ) 50 MG tablet Take 50 mg by mouth daily.     magnesium  oxide (MAG-OX) 400 (240 Mg) MG tablet Take 400 mg by mouth daily.      multivitamin-lutein (OCUVITE-LUTEIN) CAPS capsule Take 1 capsule by mouth daily.     pramipexole  (MIRAPEX ) 0.5 MG tablet Take 1 tablet (0.5 mg total) by mouth 2 (two) times daily. 60 tablet 5   pregabalin  (LYRICA ) 25 MG capsule Week 1: take 75 mg (3 tabs) In the AM and PM. Week 2: take 50 mg (2 tabs) in the AM and PM. Week 3:  take 25 mg (1tab) in the AM and PM. Then stop medication 84 capsule 0   rosuvastatin  (CRESTOR ) 40 MG tablet TAKE ONE TABLET BY MOUTH EVERY MORNING 90 tablet 3   sodium zirconium cyclosilicate (LOKELMA) 10 g PACK packet Take 10 g by mouth.     tamsulosin  (FLOMAX ) 0.4 MG CAPS capsule Take 0.4 mg by mouth daily.     No facility-administered medications prior to visit.    PAST MEDICAL HISTORY: Past Medical History:  Diagnosis Date   Allergic rhinitis    Angio-edema    Bipolar disorder (HCC)    Bradycardia 2012    due to lithium   CKD (chronic kidney disease) stage 2, GFR 60-89 ml/min    DM2 (diabetes mellitus, type 2) (HCC)    Gout    Hypertension    Mild renal insufficiency    , with creatinine of 1.3 in 2010, was side effect of med   Obesity    ,moderate   Prostatitis    , Episodic   Stroke (HCC)    Suicide attempt (HCC) 09/20/2014   Urticaria     PAST SURGICAL HISTORY: Past Surgical History:  Procedure Laterality Date   APPENDECTOMY     CATARACT EXTRACTION Right    COLONOSCOPY WITH PROPOFOL  N/A 06/14/2014   Procedure: COLONOSCOPY WITH PROPOFOL ;  Surgeon: Gladis MARLA Louder, MD;  Location: [facility];  Service: Endoscopy;  Laterality: N/A;   TONSILLECTOMY     UMBILICAL HERNIA REPAIR      FAMILY HISTORY: Family  History  Problem Relation Age of Onset   Other Mother        Viral Meningitis   Mitral valve prolapse Mother    Arrhythmia Mother    Hypothyroidism Mother    Stroke Mother    Hypertension Father    Gout Father    Alzheimer's disease Father     SOCIAL HISTORY: Social History   Socioeconomic History   Marital status: Married     Spouse name: Sweet   Number of children: 0   Years of education: Not on file   Highest education level: Master's degree (e.g., MA, MS, MEng, MEd, MSW, MBA)  Occupational History   Not on file  Tobacco Use   Smoking status: Never    Passive exposure: Never   Smokeless tobacco: Never  Vaping Use   Vaping status: Never Used  Substance and Sexual Activity   Alcohol use: No   Drug use: No   Sexual activity: Not on file  Other Topics Concern   Not on file  Social History Narrative   Lives alone   Left handed   Caffeine: 2 ice tea a day   Social Drivers of Health   Financial Resource Strain: Low Risk  (02/22/2022)   Overall Financial Resource Strain (CARDIA)    Difficulty of Paying Living Expenses: Not hard at all  Food Insecurity: No Food Insecurity (08/09/2023)   Hunger Vital Sign    Worried About Running Out of Food in the Last Year: Never true    Ran Out of Food in the Last Year: Never true  Transportation Needs: No Transportation Needs (08/09/2023)   PRAPARE - Administrator, Civil Service (Medical): No    Lack of Transportation (Non-Medical): No  Physical Activity: Not on file  Stress: No Stress Concern Present (02/22/2022)   [employer] of Occupational Health - Occupational Stress Questionnaire    Feeling of Stress : Only a little  Social Connections: Unknown (04/03/2022)   Received from [facility]   Social Network    Social Network: Not on file  Intimate Partner Violence: Not At Risk (08/09/2023)   Humiliation, Afraid, Rape, and Kick questionnaire    Fear of Current or Ex-Partner: No    Emotionally Abused: No    Physically Abused: No    Sexually Abused: No      PHYSICAL EXAM  There were no vitals filed for this visit.  There is no height or weight on file to calculate BMI.  Generalized: Well developed, in no acute distress   Neurological examination  Mentation: Alert oriented to time, place, history taking. Follows all commands speech and  language fluent Cranial nerve II-XII: Pupils were equal round reactive to light. Extraocular movements were full, visual field were full on confrontational test. Facial sensation and strength were normal. . Head turning and shoulder shrug  were normal and symmetric. Motor: The motor testing reveals 5 over 5 strength of all 4 extremities. Good symmetric motor tone is noted throughout.  Sensory: Sensory testing is intact to soft touch on all 4 extremities. No evidence of extinction is noted.  Coordination: Cerebellar testing reveals good finger-nose-finger and heel-to-shin bilaterally.  Gait and station: Gait is normal.  Reflexes: Deep tendon reflexes are symmetric and normal bilaterally.   DIAGNOSTIC DATA (LABS, IMAGING, TESTING) - I reviewed patient records, labs, notes, testing and imaging myself where available.  Lab Results  Component Value Date   WBC 9.8 10/24/2023   HGB 12.9 (L) 10/24/2023   HCT  39.3 10/24/2023   MCV 92.3 10/24/2023   PLT 174 10/24/2023      Component Value Date/Time   NA 141 10/24/2023 0258   K 4.4 10/24/2023 0258   CL 108 10/24/2023 0258   CO2 23 10/24/2023 0258   GLUCOSE 160 (H) 10/24/2023 0258   BUN 43 (H) 10/24/2023 0258   CREATININE 1.63 (H) 10/24/2023 0258   CALCIUM  9.1 10/24/2023 0258   PROT 7.1 10/24/2023 0258   ALBUMIN 3.9 10/24/2023 0258   AST 19 10/24/2023 0258   ALT 23 10/24/2023 0258   ALKPHOS 87 10/24/2023 0258   BILITOT 0.4 10/24/2023 0258   GFRNONAA 46 (L) 10/24/2023 0258   GFRAA >60 02/04/2017 0525   Lab Results  Component Value Date   CHOL 115 02/20/2024   HDL 45 02/20/2024   LDLCALC 51 02/20/2024   TRIG 101 02/20/2024   CHOLHDL 2.6 02/20/2024   Lab Results  Component Value Date   HGBA1C 6.9 (H) 02/20/2024   No results found for: VITAMINB12 Lab Results  Component Value Date   TSH 1.405 05/30/2023      ASSESSMENT AND PLAN 68 y.o. year old male  has a past medical history of Allergic rhinitis, Angio-edema, Bipolar  disorder (HCC), Bradycardia (2012), CKD (chronic kidney disease) stage 2, GFR 60-89 ml/min, DM2 (diabetes mellitus, type 2) (HCC), Gout, Hypertension, Mild renal insufficiency, Obesity, Prostatitis, Stroke (HCC), Suicide attempt (HCC) (09/20/2014), and Urticaria. here with:  H/o of stroke with residual paresthesias Paresthesias in the right upper extremity   Patient just saw Dr. Rosemarie at the beginning of the month.  Nerve conduction studies with EMG was ordered-however it does not look like it has been scheduled.  I will work on this today.  Encouraged patient to continue with the Lyrica  100 mg twice a day for now.  He was concerned about potential side effect of erectile dysfunction.  I did advise the patient that we could wean off of Lyrica  to see if this improved.  However the patient deferred for now.  I did advise that getting nerve conduction studies completed prior to any additional medication changes was ideal.  Certainly if symptoms worsen or he develops new symptoms he should let us  know.  Follow-up after nerve conduction studies     Duwaine Russell, MSN, NP-C 10/06/2024, 10:23 AM [facility] [address] [place], [place] [address] [phone]

## 2024-10-07 ENCOUNTER — Ambulatory Visit: Admitting: Adult Health

## 2024-10-07 ENCOUNTER — Encounter: Payer: Self-pay | Admitting: Adult Health

## 2024-10-07 VITALS — BP 108/67 | HR 80 | Ht 70.0 in | Wt 259.0 lb

## 2024-10-07 DIAGNOSIS — R202 Paresthesia of skin: Secondary | ICD-10-CM

## 2024-10-07 DIAGNOSIS — Z8673 Personal history of transient ischemic attack (TIA), and cerebral infarction without residual deficits: Secondary | ICD-10-CM

## 2024-10-07 NOTE — Patient Instructions (Signed)
 Your Plan:  Continue plavix  Blood pressure goal <130/90 Cholesterol LDL goal <70 Diabetes goal A1c <7 Continue to monitor symptoms    Thank you for coming to see us  at [facility]. I hope we have been able to provide you high quality care today.  You may receive a patient satisfaction survey over the next few weeks. We would appreciate your feedback and comments so that we may continue to improve ourselves and the health of our patients.

## 2024-10-13 ENCOUNTER — Other Ambulatory Visit: Payer: Self-pay | Admitting: Psychiatry

## 2024-10-13 DIAGNOSIS — F3181 Bipolar II disorder: Secondary | ICD-10-CM

## 2024-10-13 DIAGNOSIS — F4310 Post-traumatic stress disorder, unspecified: Secondary | ICD-10-CM

## 2024-10-13 DIAGNOSIS — M79673 Pain in unspecified foot: Secondary | ICD-10-CM | POA: Diagnosis not present

## 2024-10-19 ENCOUNTER — Ambulatory Visit (INDEPENDENT_AMBULATORY_CARE_PROVIDER_SITE_OTHER)

## 2024-10-19 ENCOUNTER — Encounter: Payer: Self-pay | Admitting: Podiatry

## 2024-10-19 ENCOUNTER — Ambulatory Visit: Admitting: Podiatry

## 2024-10-19 DIAGNOSIS — M7662 Achilles tendinitis, left leg: Secondary | ICD-10-CM | POA: Diagnosis not present

## 2024-10-19 DIAGNOSIS — M722 Plantar fascial fibromatosis: Secondary | ICD-10-CM

## 2024-10-19 MED ORDER — PREDNISONE 10 MG PO TABS: ORAL_TABLET | ORAL | 0 refills | Status: AC

## 2024-10-19 NOTE — Progress Notes (Signed)
 Subjective:  Patient ID: Edwin [name], male    DOB: Oct 03, 1956,  MRN: [id number]  Chief Complaint  Patient presents with   Diabetes    Urgent Care said I have a spur on the back of my left heel.    Discussed the use of AI scribe software for clinical note transcription with the patient, who gave verbal consent to proceed.  History of Present Illness Edwin [name] is a 68 year old male with chronic kidney disease who presents with heel pain.  He has been experiencing significant pain at the back of his heel for about a month, describing it as sometimes a burning sensation. The pain is severe enough to impair his ability to walk, particularly noted during [place]. He has tried using Amber cream and pain patches applied by his wife, but these have not provided sufficient relief.  He visited an urgent care center where he was told it might be a bone spur. No recent injuries or falls.  He has a history of a mild stroke two years ago and chronic kidney disease, which limits his use of certain anti-inflammatory medications. He has not had any knee or hip surgeries but reports significant knee pain, particularly when rising from a sitting position. He has seen multiple orthopedists for his knee issues, received steroid treatments, and tried physical therapy without lasting relief. He has been informed of a meniscus tear in his right knee and has experienced temporary relief from injections.  No redness, swelling, or clicking in the knees.      Objective:    Physical Exam VASCULAR: DP and PT pulse palpable. Foot is warm and well-perfused. Capillary fill time is brisk. DERMATOLOGIC: Normal skin turgor, texture, and temperature. No open lesions, rashes, or ulcerations. NEUROLOGIC: Normal sensation to light touch and pressure. No paresthesias on examination. ORTHOPEDIC: Pain on palpation at posterior left heel insertion. No pain  in the midsubstance of the left heel. Gastrocnemius equinus present. No evidence of stress fracture on physical exam. Smooth pain-free range of motion of all examined joints. No ecchymosis or bruising. No gross deformity.       Results RADIOLOGY Left heel X-ray: Posterior Haglund deformity, calcification of Achilles insertion, no fracture or stress fracture (10/19/2024) Knee MRI: Patellar spurs present, no significant findings   Assessment:   1. Achilles tendinitis of left lower extremity      Plan:  Patient was evaluated and treated and all questions answered.  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 Pain exacerbated by walking and prolonged standing. X-ray shows calcification and spur formation. Conservative management preferred over surgical intervention due to potential risks of tendon rupture with steroid injections. - Initiated prednisone  taper for inflammation control, monitoring blood sugar levels. - Prescribed a walking boot for soft tissue immobilization and rest for 4 to 5 weeks - Home physical therapy for strengthening and rehabilitation.  Exercises given - Scheduled follow-up in one month to assess progress and adjust treatment as needed.  Gastrocnemius equinus, left lower leg Gastrocnemius equinus contributing to Achilles tendinitis. Tightness in the gastrocnemius muscle increases tension on the Achilles tendon.  Limb length discrepancy, left leg longer Limb length discrepancy with the left leg approximately half an inch longer than the right, contributing to biomechanical stress and pain. - Recommended use of an 'Even Up' shoe  brace on the right shoe to level the hips and knees. - Advised wearing a sneaker with a thick sole on the right foot.  Chronic knee pain  Discussed with him would  seek additional opinions until plan of action is recommended advised possibly a sports medicine follow-up might be beneficial  Chronic kidney disease, unspecified stage Chronic kidney disease limits the use of NSAIDs for inflammation control. Prednisone  taper considered safe for kidney function. - Continue to monitor kidney function during prednisone  taper.      Return in about 1 month (around 11/19/2024) for re-check Achilles tendon.

## 2024-10-19 NOTE — Patient Instructions (Addendum)
 VISIT SUMMARY: Today, you were seen for heel pain that has been affecting your ability to walk. We discussed your chronic kidney disease and its impact on your treatment options. You also have a history of knee pain and a meniscus tear, which we addressed during your visit.  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 We have started you on a prednisone  taper to reduce inflammation, prescribed a walking boot to rest your heel, and referred you to physical therapy. We will follow up in one month to see how you are doing.  -GASTROCNEMIUS EQUINUS, LEFT LOWER LEG: This condition means that the calf muscle is tight, which puts extra strain on your Achilles tendon. This contributes to your heel pain.  -LIMB LENGTH DISCREPANCY, LEFT LEG LONGER: Your left leg is slightly longer than your right, which can cause stress and pain in your legs. We recommend using an 'Even Up' shoe brace on your right shoe and wearing a sneaker with a thick sole on your right foot to help level your hips and knees.   -CHRONIC KIDNEY DISEASE, UNSPECIFIED STAGE: This is a long-term condition where your kidneys do not work as well as they should. We will continue to monitor your kidney function, especially while you are on the prednisone  taper.  INSTRUCTIONS: Please follow up in one month to assess your progress with the current treatment plan. Continue to monitor your blood sugar levels while on prednisone  and keep track of any changes in your symptoms. Make sure to attend physical therapy sessions and use the walking boot as prescribed. Additionally, use the 'Even Up' shoe brace and wear a thick-soled sneaker on your right foot to help with your limb length discrepancy. Consult with the sports medicine specialist for your knee pain and consider getting multiple  opinions to determine the best course of action.                      Contains text generated by [employer].                                 Contains text generated by [employer].    Achilles Tendinitis  with Rehab Achilles tendinitis is a disorder of the Achilles tendon. The Achilles tendon connects the large calf muscles (Gastrocnemius and Soleus) to the heel bone (calcaneus). This tendon is sometimes called the heel cord. It is important for pushing-off and standing on your toes and is important for walking, running, or jumping. Tendinitis is often caused by overuse and repetitive microtrauma. SYMPTOMS Pain, tenderness, swelling, warmth, and redness may occur over the Achilles tendon even at rest. Pain with pushing off, or flexing or extending the ankle. Pain that is worsened after or during activity. CAUSES  Overuse sometimes seen with rapid increase in exercise programs or in sports requiring running and jumping. Poor physical conditioning (strength and flexibility or endurance). Running sports, especially training running down hills. Inadequate warm-up before practice or play or failure to stretch before participation. Injury to the tendon. PREVENTION  Warm up and stretch before practice or competition. Allow time for adequate rest and recovery between practices and competition. Keep up conditioning. Keep up ankle and leg flexibility. Improve or keep muscle strength and endurance. Improve cardiovascular fitness. Use proper technique. Use proper equipment (shoes,  skates). To help prevent recurrence, taping, protective strapping, or an adhesive bandage may be recommended for several weeks after healing is complete. PROGNOSIS  Recovery may take weeks to several months to heal. Longer recovery is expected if symptoms have been prolonged. Recovery is usually quicker if the inflammation is due to a direct blow as compared with overuse or  sudden strain. RELATED COMPLICATIONS  Healing time will be prolonged if the condition is not correctly treated. The injury must be given plenty of time to heal. Symptoms can reoccur if activity is resumed too soon. Untreated, tendinitis may increase the risk of tendon rupture requiring additional time for recovery and possibly surgery. TREATMENT  The first treatment consists of rest anti-inflammatory medication, and ice to relieve the pain. Stretching and strengthening exercises after resolution of pain will likely help reduce the risk of recurrence. Referral to a physical therapist or athletic trainer for further evaluation and treatment may be helpful. A walking boot or cast may be recommended to rest the Achilles tendon. This can help break the cycle of inflammation and microtrauma. Arch supports (orthotics) may be prescribed or recommended by your caregiver as an adjunct to therapy and rest. Surgery to remove the inflamed tendon lining or degenerated tendon tissue is rarely necessary and has shown less than predictable results. MEDICATION  Nonsteroidal anti-inflammatory medications, such as aspirin and ibuprofen, may be used for pain and inflammation relief. Do not take within 7 days before surgery. Take these as directed by your caregiver. Contact your caregiver immediately if any bleeding, stomach upset, or signs of allergic reaction occur. Other minor pain relievers, such as acetaminophen , may also be used. Pain relievers may be prescribed as necessary by your caregiver. Do not take prescription pain medication for longer than 4 to 7 days. Use only as directed and only as much as you need. Cortisone injections are rarely indicated. Cortisone injections may weaken tendons and predispose to rupture. It is better to give the condition more time to heal than to use them. HEAT AND COLD Cold is used to relieve pain and reduce inflammation for acute and chronic Achilles tendinitis. Cold should be  applied for 10 to 15 minutes every 2 to 3 hours for inflammation and pain and immediately after any activity that aggravates your symptoms. Use ice packs or an ice massage. Heat may be used before performing stretching and strengthening activities prescribed by your caregiver. Use a heat pack or a warm soak. SEEK MEDICAL CARE IF: Symptoms get worse or do not improve in 2 weeks despite treatment. New, unexplained symptoms develop. Drugs used in treatment may produce side effects.  EXERCISES:  RANGE OF MOTION (ROM) AND STRETCHING EXERCISES - Achilles Tendinitis  These exercises may help you when beginning to rehabilitate your injury. Your symptoms may resolve with or without further involvement from your physician, physical therapist or athletic trainer. While completing these exercises, remember:  Restoring tissue flexibility helps normal motion to return to the joints. This allows healthier, less painful movement and activity. An effective stretch should be held for at least 30 seconds. A stretch should never be painful. You should only feel a gentle lengthening or release in the stretched tissue.  STRETCH  Gastroc, Standing  Place hands on wall. Extend right / left leg, keeping the front knee somewhat bent. Slightly point your toes inward on your back foot. Keeping your right / left heel on the floor and your knee straight, shift your weight toward the wall, not allowing your back to  arch. You should feel a gentle stretch in the right / left calf. Hold this position for 10 seconds. Repeat 3 times. Complete this stretch 2 times per day.  STRETCH  Soleus, Standing  Place hands on wall. Extend right / left leg, keeping the other knee somewhat bent. Slightly point your toes inward on your back foot. Keep your right / left heel on the floor, bend your back knee, and slightly shift your weight over the back leg so that you feel a gentle stretch deep in your back calf. Hold this position for 10  seconds. Repeat 3 times. Complete this stretch 2 times per day.  STRETCH  Gastrocsoleus, Standing  Note: This exercise can place a lot of stress on your foot and ankle. Please complete this exercise only if specifically instructed by your caregiver.  Place the ball of your right / left foot on a step, keeping your other foot firmly on the same step. Hold on to the wall or a rail for balance. Slowly lift your other foot, allowing your body weight to press your heel down over the edge of the step. You should feel a stretch in your right / left calf. Hold this position for 10 seconds. Repeat this exercise with a slight bend in your knee. Repeat 3 times. Complete this stretch 2 times per day.   STRENGTHENING EXERCISES - Achilles Tendinitis These exercises may help you when beginning to rehabilitate your injury. They may resolve your symptoms with or without further involvement from your physician, physical therapist or athletic trainer. While completing these exercises, remember:  Muscles can gain both the endurance and the strength needed for everyday activities through controlled exercises. Complete these exercises as instructed by your physician, physical therapist or athletic trainer. Progress the resistance and repetitions only as guided. You may experience muscle soreness or fatigue, but the pain or discomfort you are trying to eliminate should never worsen during these exercises. If this pain does worsen, stop and make certain you are following the directions exactly. If the pain is still present after adjustments, discontinue the exercise until you can discuss the trouble with your clinician.  STRENGTH - Plantar-flexors  Sit with your right / left leg extended. Holding onto both ends of a rubber exercise band/tubing, loop it around the ball of your foot. Keep a slight tension in the band. Slowly push your toes away from you, pointing them downward. Hold this position for 10 seconds. Return  slowly, controlling the tension in the band/tubing. Repeat 3 times. Complete this exercise 2 times per day.   STRENGTH - Plantar-flexors  Stand with your feet shoulder width apart. Steady yourself with a wall or table using as little support as needed. Keeping your weight evenly spread over the width of your feet, rise up on your toes.* Hold this position for 10 seconds. Repeat 3 times. Complete this exercise 2 times per day.  *If this is too easy, shift your weight toward your right / left leg until you feel challenged. Ultimately, you may be asked to do this exercise with your right / left foot only.  STRENGTH  Plantar-flexors, Eccentric  Note: This exercise can place a lot of stress on your foot and ankle. Please complete this exercise only if specifically instructed by your caregiver.  Place the balls of your feet on a step. With your hands, use only enough support from a wall or rail to keep your balance. Keep your knees straight and rise up on your toes. Slowly  shift your weight entirely to your right / left toes and pick up your opposite foot. Gently and with controlled movement, lower your weight through your right / left foot so that your heel drops below the level of the step. You will feel a slight stretch in the back of your calf at the end position. Use the healthy leg to help rise up onto the balls of both feet, then lower weight only on the right / left leg again. Build up to 15 repetitions. Then progress to 3 consecutive sets of 15 repetitions.* After completing the above exercise, complete the same exercise with a slight knee bend (about 30 degrees). Again, build up to 15 repetitions. Then progress to 3 consecutive sets of 15 repetitions.* Perform this exercise 2 times per day.  *When you easily complete 3 sets of 15, your physician, physical therapist or athletic trainer may advise you to add resistance by wearing a backpack filled with additional weight.  STRENGTH - Plantar  Flexors, Seated  Sit on a chair that allows your feet to rest flat on the ground. If necessary, sit at the edge of the chair. Keeping your toes firmly on the ground, lift your right / left heel as far as you can without increasing any discomfort in your ankle. Repeat 3 times. Complete this exercise 2 times a day.

## 2024-10-22 DIAGNOSIS — L853 Xerosis cutis: Secondary | ICD-10-CM | POA: Diagnosis not present

## 2024-10-22 DIAGNOSIS — L821 Other seborrheic keratosis: Secondary | ICD-10-CM | POA: Diagnosis not present

## 2024-10-22 DIAGNOSIS — L814 Other melanin hyperpigmentation: Secondary | ICD-10-CM | POA: Diagnosis not present

## 2024-10-22 DIAGNOSIS — D1801 Hemangioma of skin and subcutaneous tissue: Secondary | ICD-10-CM | POA: Diagnosis not present

## 2024-11-24 ENCOUNTER — Ambulatory Visit: Admitting: Podiatry

## 2024-11-24 VITALS — Ht 70.0 in | Wt 259.0 lb

## 2024-11-24 DIAGNOSIS — M7662 Achilles tendinitis, left leg: Secondary | ICD-10-CM

## 2024-11-24 NOTE — Progress Notes (Signed)
"  °  Subjective:  Patient ID: Edwin [name], male    DOB: 1956-06-05,  MRN: [id number]  Chief Complaint  Patient presents with   Tendonitis    RM 4 Patient is here to f.u on achilles tendonitis of the left leg. Pt states great relief from pain while wearing the boot.     Discussed the use of AI scribe software for clinical note transcription with the patient, who gave verbal consent to proceed.  History of Present Illness Edwin [name] is a 69 year old male with chronic kidney disease who presents with heel pain.  He returns for follow-up so far not much improvement.  The boot helps but he has significant pain outside of the boot.      Objective:    Physical Exam VASCULAR: DP and PT pulse palpable. Foot is warm and well-perfused. Capillary fill time is brisk. DERMATOLOGIC: Normal skin turgor, texture, and temperature. No open lesions, rashes, or ulcerations. NEUROLOGIC: Normal sensation to light touch and pressure. No paresthesias on examination. ORTHOPEDIC: Still has significant pain on palpation at posterior left heel insertion. No pain in the midsubstance of the left Achilles. Gastrocnemius equinus present.        Results RADIOLOGY Left heel X-ray: Posterior Haglund deformity, calcification of Achilles insertion with posterior spur, no fracture or stress fracture (10/19/2024) Knee MRI: Patellar spurs present, no significant findings   Assessment:   1. Achilles tendinitis of left lower extremity      Plan:  Patient was evaluated and treated and all questions answered.  Assessment and Plan Assessment & Plan Left Achilles tendinitis with calcification and posterior Haglund deformity So far only minimal improvement with 1 month boot immobilization as well as a prednisone  taper.  I recommended formal physical therapy and referral was placed for this.  Home stretching has not been effective for him so far.  Would like to avoid further steroids or NSAIDs if possible.  Also  discussed surgical options and recommend MRI to evaluate for the possibility of these if it is not improving.  Referral placed for the MRI.  Follow-up with me in 6 weeks to reevaluate.      Return in about 6 weeks (around 01/05/2025) for re-check Achilles tendon.   "

## 2024-11-24 NOTE — Patient Instructions (Signed)
 Call to schedule physical therapy: [facility] at [facility] [address] [place]  [phone]  Call [facility] to schedule your MRI at the below locations.  Please allow at least 1 business day after your visit to process the referral.  It may take longer depending on approval from insurance.  Please let me know if you have issues or problems scheduling the MRI   [facility] [place] [phone] [address] [place] [address] [place], [place] [address]  [facility] [phone] [place], [place] [address]  Achilles Tendinitis  with Rehab Achilles tendinitis is a disorder of the Achilles tendon. The Achilles tendon connects the large calf muscles (Gastrocnemius and Soleus) to the heel bone (calcaneus). This tendon is sometimes called the heel cord. It is important for pushing-off and standing on your toes and is important for walking, running, or jumping. Tendinitis is often caused by overuse and repetitive microtrauma. SYMPTOMS Pain, tenderness, swelling, warmth, and redness may occur over the Achilles tendon even at rest. Pain with pushing off, or flexing or extending the ankle. Pain that is worsened after or during activity. CAUSES  Overuse sometimes seen with rapid increase in exercise programs or in sports requiring running and jumping. Poor physical conditioning (strength and flexibility or endurance). Running sports, especially training running down hills. Inadequate warm-up before practice or play or failure to stretch before participation. Injury to the tendon. PREVENTION  Warm up and stretch before practice or competition. Allow time for adequate rest and recovery between practices and competition. Keep up conditioning. Keep up ankle and leg flexibility. Improve or keep muscle strength and endurance. Improve cardiovascular fitness. Use proper technique. Use proper equipment  (shoes, skates). To help prevent recurrence, taping, protective strapping, or an adhesive bandage may be recommended for several weeks after healing is complete. PROGNOSIS  Recovery may take weeks to several months to heal. Longer recovery is expected if symptoms have been prolonged. Recovery is usually quicker if the inflammation is due to a direct blow as compared with overuse or sudden strain. RELATED COMPLICATIONS  Healing time will be prolonged if the condition is not correctly treated. The injury must be given plenty of time to heal. Symptoms can reoccur if activity is resumed too soon. Untreated, tendinitis may increase the risk of tendon rupture requiring additional time for recovery and possibly surgery. TREATMENT  The first treatment consists of rest anti-inflammatory medication, and ice to relieve the pain. Stretching and strengthening exercises after resolution of pain will likely help reduce the risk of recurrence. Referral to a physical therapist or athletic trainer for further evaluation and treatment may be helpful. A walking boot or cast may be recommended to rest the Achilles tendon. This can help break the cycle of inflammation and microtrauma. Arch supports (orthotics) may be prescribed or recommended by your caregiver as an adjunct to therapy and rest. Surgery to remove the inflamed tendon lining or degenerated tendon tissue is rarely necessary and has shown less than predictable results. MEDICATION  Nonsteroidal anti-inflammatory medications, such as aspirin and ibuprofen, may be used for pain and inflammation relief. Do not take within 7 days before surgery. Take these as directed by your caregiver. Contact your caregiver immediately if any bleeding, stomach upset, or signs of allergic reaction occur. Other minor pain relievers, such as acetaminophen , may also be used. Pain relievers may be prescribed as necessary by your caregiver. Do not take prescription pain medication  for longer than 4  to 7 days. Use only as directed and only as much as you need. Cortisone injections are rarely indicated. Cortisone injections may weaken tendons and predispose to rupture. It is better to give the condition more time to heal than to use them. HEAT AND COLD Cold is used to relieve pain and reduce inflammation for acute and chronic Achilles tendinitis. Cold should be applied for 10 to 15 minutes every 2 to 3 hours for inflammation and pain and immediately after any activity that aggravates your symptoms. Use ice packs or an ice massage. Heat may be used before performing stretching and strengthening activities prescribed by your caregiver. Use a heat pack or a warm soak. SEEK MEDICAL CARE IF: Symptoms get worse or do not improve in 2 weeks despite treatment. New, unexplained symptoms develop. Drugs used in treatment may produce side effects.  EXERCISES:  RANGE OF MOTION (ROM) AND STRETCHING EXERCISES - Achilles Tendinitis  These exercises may help you when beginning to rehabilitate your injury. Your symptoms may resolve with or without further involvement from your physician, physical therapist or athletic trainer. While completing these exercises, remember:  Restoring tissue flexibility helps normal motion to return to the joints. This allows healthier, less painful movement and activity. An effective stretch should be held for at least 30 seconds. A stretch should never be painful. You should only feel a gentle lengthening or release in the stretched tissue.  STRETCH  Gastroc, Standing  Place hands on wall. Extend right / left leg, keeping the front knee somewhat bent. Slightly point your toes inward on your back foot. Keeping your right / left heel on the floor and your knee straight, shift your weight toward the wall, not allowing your back to arch. You should feel a gentle stretch in the right / left calf. Hold this position for 10 seconds. Repeat 3 times. Complete this  stretch 2 times per day.  STRETCH  Soleus, Standing  Place hands on wall. Extend right / left leg, keeping the other knee somewhat bent. Slightly point your toes inward on your back foot. Keep your right / left heel on the floor, bend your back knee, and slightly shift your weight over the back leg so that you feel a gentle stretch deep in your back calf. Hold this position for 10 seconds. Repeat 3 times. Complete this stretch 2 times per day.  STRETCH  Gastrocsoleus, Standing  Note: This exercise can place a lot of stress on your foot and ankle. Please complete this exercise only if specifically instructed by your caregiver.  Place the ball of your right / left foot on a step, keeping your other foot firmly on the same step. Hold on to the wall or a rail for balance. Slowly lift your other foot, allowing your body weight to press your heel down over the edge of the step. You should feel a stretch in your right / left calf. Hold this position for 10 seconds. Repeat this exercise with a slight bend in your knee. Repeat 3 times. Complete this stretch 2 times per day.   STRENGTHENING EXERCISES - Achilles Tendinitis These exercises may help you when beginning to rehabilitate your injury. They may resolve your symptoms with or without further involvement from your physician, physical therapist or athletic trainer. While completing these exercises, remember:  Muscles can gain both the endurance and the strength needed for everyday activities through controlled exercises. Complete these exercises as instructed by your physician, physical therapist or athletic trainer. Progress the resistance  and repetitions only as guided. You may experience muscle soreness or fatigue, but the pain or discomfort you are trying to eliminate should never worsen during these exercises. If this pain does worsen, stop and make certain you are following the directions exactly. If the pain is still present after  adjustments, discontinue the exercise until you can discuss the trouble with your clinician.  STRENGTH - Plantar-flexors  Sit with your right / left leg extended. Holding onto both ends of a rubber exercise band/tubing, loop it around the ball of your foot. Keep a slight tension in the band. Slowly push your toes away from you, pointing them downward. Hold this position for 10 seconds. Return slowly, controlling the tension in the band/tubing. Repeat 3 times. Complete this exercise 2 times per day.   STRENGTH - Plantar-flexors  Stand with your feet shoulder width apart. Steady yourself with a wall or table using as little support as needed. Keeping your weight evenly spread over the width of your feet, rise up on your toes.* Hold this position for 10 seconds. Repeat 3 times. Complete this exercise 2 times per day.  *If this is too easy, shift your weight toward your right / left leg until you feel challenged. Ultimately, you may be asked to do this exercise with your right / left foot only.  STRENGTH  Plantar-flexors, Eccentric  Note: This exercise can place a lot of stress on your foot and ankle. Please complete this exercise only if specifically instructed by your caregiver.  Place the balls of your feet on a step. With your hands, use only enough support from a wall or rail to keep your balance. Keep your knees straight and rise up on your toes. Slowly shift your weight entirely to your right / left toes and pick up your opposite foot. Gently and with controlled movement, lower your weight through your right / left foot so that your heel drops below the level of the step. You will feel a slight stretch in the back of your calf at the end position. Use the healthy leg to help rise up onto the balls of both feet, then lower weight only on the right / left leg again. Build up to 15 repetitions. Then progress to 3 consecutive sets of 15 repetitions.* After completing the above exercise, complete  the same exercise with a slight knee bend (about 30 degrees). Again, build up to 15 repetitions. Then progress to 3 consecutive sets of 15 repetitions.* Perform this exercise 2 times per day.  *When you easily complete 3 sets of 15, your physician, physical therapist or athletic trainer may advise you to add resistance by wearing a backpack filled with additional weight.  STRENGTH - Plantar Flexors, Seated  Sit on a chair that allows your feet to rest flat on the ground. If necessary, sit at the edge of the chair. Keeping your toes firmly on the ground, lift your right / left heel as far as you can without increasing any discomfort in your ankle. Repeat 3 times. Complete this exercise 2 times a day.

## 2024-11-26 ENCOUNTER — Encounter: Payer: Self-pay | Admitting: Psychiatry

## 2024-11-26 ENCOUNTER — Ambulatory Visit (INDEPENDENT_AMBULATORY_CARE_PROVIDER_SITE_OTHER): Admitting: Psychiatry

## 2024-11-26 DIAGNOSIS — G4733 Obstructive sleep apnea (adult) (pediatric): Secondary | ICD-10-CM

## 2024-11-26 DIAGNOSIS — F3181 Bipolar II disorder: Secondary | ICD-10-CM

## 2024-11-26 DIAGNOSIS — F4310 Post-traumatic stress disorder, unspecified: Secondary | ICD-10-CM | POA: Diagnosis not present

## 2024-11-26 MED ORDER — DIVALPROEX SODIUM ER 500 MG PO TB24: 1000.0000 mg | ORAL_TABLET | Freq: Every day | ORAL | 1 refills | Status: AC

## 2024-11-26 MED ORDER — DULOXETINE HCL 60 MG PO CPEP: 120.0000 mg | ORAL_CAPSULE | Freq: Every day | ORAL | 1 refills | Status: AC

## 2024-11-26 MED ORDER — PRAMIPEXOLE DIHYDROCHLORIDE 0.5 MG PO TABS: 0.5000 mg | ORAL_TABLET | Freq: Two times a day (BID) | ORAL | 1 refills | Status: AC

## 2024-11-26 NOTE — Progress Notes (Signed)
 Edwin [name] [id number] 17-Sep-1956 69 y.o.  Subjective:   Patient ID:  Edwin [name] is a 69 y.o. (DOB August 16, 1956) male.  Chief Complaint:  Chief Complaint  Patient presents with   Follow-up   Depression   Anxiety    Edwin [name] presents to the office today for follow-up of bipolar depression and anxiety.  seen July 02, 2019.  No meds were changed.  Patient was doing well.  He asked me to write a letter to his attorney and support his intention to marry a woman from [place] and that his mental health was sufficiently well that he would not represent a danger to his fiance.  This was discussed with his attorney and the letter was written.  Met girl June 8. Met her on dating site international.  [place] woman and decided wanted to get married.  Options opening up after being shut down by Covid.  seen November 2020.  No meds were changed.  He was overall doing well.  seen January 20, 2020 .  No med changed.  Following noted. Wonders about elevated liver enzymes and Depakote .  Labs not visible in Epic.  PCP Dr. Jon Jacob is working up this finding.   Doing great from mental health.  Talks daily to GF in [place].    03/23/20 the following is noted:    Taken on too many students.  Recognizes needs to have 2 days off a week and plans to schedule that.  Needs to get through end of the semester.  Fiance visa is difficult but she just got admitted to [facility] pending some exams being passed.  Visa difficult with Covid affecting travel.  She could be here in mid-July. No med changes  07/06/20 with the following noted: Still good. Pramipexole  has kept depression at bay. Mood is still stable. Good interest in basketball. Still concerned about weight and blames meds and wonders about weight loss meds and supplements.  Asked about L-glutamine taken it for a week.  Trying to watch diet.  Hasn't weighed himself. Plan: No med changes indicated yet.  Sig risk in switching his primary mood  stabilizer unless its absolutely nececessary.  10/05/20 appt noted: Consistent with meds.  Still doing great overall.  Some trouble sleeping and got help with CPAP with Dr. Tammy.  Tendency to sleep 3 hours and awakened.  May stay awake 1-2 hours and eventually get sufficient sleep.  Can awaken refreshed. No SE except weight gain.   Got denied for Ozempic.  Very concerned about weight gain with Depakote . Still facetiming with GF in [place] and no visa for her yet bc country still shut down. Plan no med changes  01/05/2021 appointment with the following noted: Expressed concerns about weight and new dx DM.   Disc retrying efforts to get Ozempic.   Has increase glucose.   Been on phone with GF in [place] for 20 mos.  It's dragging on trying to get her here.  Thinking about going there.   Previously lost the love of his life DT his illness.  Sense of loss bc her birthday was a couple of days ago.    05/03/2021 appointment with the following noted: Doing as well as anyone can be doing.  Flew to [place] in April.  People were nice.  Enjoyed the family and went with 27 people to the beach.  GF comes from family of 9.  Going back in July and wedding July 30.  Had Face Timed for 22 mos and  gotten to know the family.  She has not been to [place]  yet but they plan to live here.  Spousal visas are back logged.   Has seen PCP and dietician and is losing weight. Plan: No med changes indicated yet.  Sig risk in switchin g his primary mood stabilizer unless its absolutely nececessary. Failed attempt to reduce pramipexole  below 0.5 mg BID  08/30/21 appt noted: Pretty well.  Got married but she's not here yet.  Working on getting her into the [place] .  Has a good immigration attorney here.  Usually takes a just a couple of mos but maybe longer  DT backlog.   Still taking meds Depakote  1000, duloxetine  120, lamotrigine  200, pramipexole  0.5 mg BID.   Stopped soda.  Working with nutritionist and down  35#. 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   Disc psychology and masculinity goals. No SE.   Satisfied with meds and no indication to change. Plan: continue Depakote  1000, duloxetine  120, lamotrigine  200, pramipexole  0.5 mg BID.   Failed attempt to reduce pramipexole  below 0.5 mg BID  01/01/22 appt noted: Going to [employer].   Very good mental health.   New wife is delayed getting here bc slow immigration.  She's in [place] right now.   Got married in [place].   OK with meds. CKD stable. Plan: No med changes indicated yet.  Sig risk in switching his primary mood stabilizer unless its absolutely nececessary. Depakote  1000, duloxetine  120, lamotrigine  200, pramipexole  0.5 mg BID.    05/01/2022 appointment with the following noted: March 15 dizzy and dx with stroke.  Thinks it might be related to untreated OSA bc CPAP machine burned up and couldn't get another.  O2 levels low in hospital in sleep. Appt  to see sleep doc soon.  Sleeping with O2 now.   No depression occurred.  Handled it well.  Lost 52# this year. Thankful for meds.   Sees Dr. Gailen 7/6 Applied for expedited Visa for wife. Plan: No med changes indicated.  Sig risk in switching his primary mood stabilizer unless its absolutely nececessary. Depakote  1000, duloxetine  120, lamotrigine  200, pramipexole  0.5 mg BID.  Gabapentin  400 BID Failed attempt to reduce pramipexole  below 0.5 mg BID  09/17/22 appt noted: 06/30/22 ED stroke  then had rehab. Stroke in March mild but not again in 8/23.  Slowly better. Walking 2-4 miles daily and it has helped. Enjoys [employer] sports Patient reports stable mood and denies depressed or irritable moods.   Patient denies any recent difficulty with anxiety.  Patient denies difficulty with sleep initiation or maintenance.  Not Using CPAP some issues getting O2.. Sleep hygiene is better now and that' s helped.  Going to bed earlier.   Denies appetite disturbance.  Patient  reports that energy and motivation have been good.  Patient denies any difficulty with concentration.  Patient denies any suicidal ideation. Mood good wihtout swings or depression.   Satisfied with meds. No alcohol.   Plan no med changes from above  01/21/23 appt noted: Continues meds as above.  Tolerating meds except constipation.  Miralax  helping. Been doing good but youngest cat died and he is grieving.  It was his favorite pet.  Didn't have to put her down.  Happened 2 mos ago.  His cat was Edwin [name].  He was a happy cat.   No mood swings.   Been going to [facility] basketball games.   Visa process is going slow to get sig other (  wife) here from [place]. Taking a long time.   No mood swings.    05/27/23 appt noted: Newly married and wife here for 2 days.  4 yr process.  W Edwin [name].   Enjoys teaching and playing piano.   Playing some at [employer]. Continue meds. No new SE.  ? Wt gain related. Patient reports stable mood and denies depressed or irritable moods.  Patient denies any recent difficulty with anxiety.  Patient denies difficulty with sleep initiation or maintenance. Denies appetite disturbance.  Patient reports that energy and motivation have been good.  Patient denies any difficulty with concentration.  Patient denies any suicidal ideation. Has seen neuro and on gabapentin  and Lyrica .   Still some PT as needed after the stroke.  Watching BP Hard losing another cat in May which mother named.   Plan: No med changes indicated.  Sig risk in switching his primary mood stabilizer unless its absolutely nececessary. Depakote  1000, duloxetine  120, lamotrigine  200, pramipexole  0.5 mg BID.  Still on gabapentin  400 mg BID  09/30/23 appt noted: Health issues since here with cellulitis, sepsis, fall, sciatica.   Lost cat of 17 years.  Got new cats. Married life is an adjustment.  Going well so far.   Switching from gabapentin  to Lyrica  for sciatica and paresthesia hand and foot. Still  playing piano.   No SE with meds unless wt gain.   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 Mood has remained stable.   Able to get by with less sleep than in the past.  Getting 4-5 hours.  Can schedule nap if needed. Rejuvinating.  To sleep quickly.  No insomnia.   No dep .  No swings.  01/29/24 appt noted: Med: Depakote  1000, duloxetine  120, lamotrigine  200, pramipexole  0.5 mg BID.  Switch gabapentin  400 mg BID to Lyrica  75 mg BID for paresthesia.  Helps some.  SE gabapentin  sleepy Mood pretty much the same.   Some stressor.  Has no contact with sister since his mother died.  Unexpected conflict with student's father out of the blue for no reason none.  Resulting in meaningless, unfounded charge of cyber-stalking.  Had to shut down business for 2 weeks the charge came from an a former student who is an pensions consultant.  Triggered some PTSD for him.   Overall satisfied with meds.  05/26/24 appt noted:  Med: Depakote  1000, duloxetine  120, lamotrigine  200, pramipexole  0.5 mg BID.   Lyrica  100 mg BID for paresthesia.  Helps some. Testosterone  shots for a week.  SE gabapentin  sleepy Business off 30% in last 8 mos. Will start SS next April. Has court date this Friday for false claim of cyberstalking.  Fears trigger of PTSD with the trial.  Has good atty.  Expects dismissal.  W Edwin [name] doing fine.  Is supportive. No concerns with meds.   Says seeing nephro for CKD with last Cr 1.9 Attending [employer].   Neuro RX Lyrica  with some benefit for nerve pain in hands.   06/26/24 appt noted:  Meds as above Very pleased with His attorney: Marylee Axe. Disc legal case over no contact order. It is resolved.  Dismissed and expunged.  But judge would not let him read his statement.  But feels vulnerable to further action from the accusers.  W Edwin [name] is supportive.  Overall managing but wants to know how to handle various situations.  08/13/24 appt noted:  Med: Depakote  1000,  duloxetine  120, lamotrigine  200, pramipexole  0.5  mg BID.   Lyrica  100 mg BID for paresthesia.  Helps some. Testosterone  shots for a week.  He feels some better.  Wrote out letter to [facility] to complain about the bullying he felt from the attorney who took him to court.   Overall feels calmer.  Some concern about recent sociopolitical events with public violence.  I carry a tremendous amount of guilt. Considering adoption o fchild familiar to wife Edwin [name] from [place].   Plan no changes  11/26/24 appt noted:  Med: Depakote  1000, duloxetine  120, lamotrigine  200, pramipexole  0.5 mg BID.   Lyrica  100 mg BID for paresthesia.  Helps some. Testosterone  shots for a week.  Achilles tendonitis.  Knee problems.   Mental health pretty good.  Not dep.  Some health issues. Disc spiritual youtubes that helped.   No problems with meds.   T'giving night 1979 disc a dedicated prayer and some suffering since then.  No med concerns.     M died 2019-07-15.  Was a relief for her.    Past Psychiatric Medication Trials: Lithium witch to VPA 2012 DT increased Cr  Depakote  1000, lamotrigine ,  gabapentin , Vraylar tremor, Rexulti SE, Abilify  increased weight,   Wellbutrin NR, phentermine,   Pramipexole  0.5 mg BID  helped mood L-Carnitine  Psych hosp 1986 helpful   Review of Systems:  Review of Systems  Constitutional:  Negative for unexpected weight change.  Cardiovascular:  Negative for palpitations.  Musculoskeletal:  Positive for arthralgias, back pain and gait problem.  Neurological:  Positive for weakness. Negative for tremors.       Paresthesia in hand  Psychiatric/Behavioral:  Negative for agitation, behavioral problems, confusion, decreased concentration, dysphoric mood, hallucinations, self-injury, sleep disturbance and suicidal ideas. The patient is not hyperactive.   No longer depressed.   Medications: I have reviewed the patient's current medications.  Current Outpatient Medications  Medication  Sig Dispense Refill   amLODipine  (NORVASC ) 5 MG tablet Take 5 mg by mouth daily.     Cholecalciferol  1000 units TBDP Take 1 tablet by mouth daily.      clopidogrel  (PLAVIX ) 75 MG tablet TAKE ONE TABLET BY MOUTH ONCE DAILY 90 tablet 0   dapagliflozin  propanediol (FARXIGA ) 10 MG TABS tablet Take 10 mg by mouth daily.     divalproex  (DEPAKOTE  ER) 500 MG 24 hr tablet Take 2 tablets (1,000 mg total) by mouth at bedtime. 180 tablet 1   DULoxetine  (CYMBALTA ) 60 MG capsule Take 2 capsules (120 mg total) by mouth daily. 180 capsule 1   febuxostat  (ULORIC ) 40 MG tablet Take 40 mg by mouth daily.     furosemide  (LASIX ) 20 MG tablet Take 20 mg by mouth every other day.     lamoTRIgine  (LAMICTAL ) 200 MG tablet TAKE ONE TABLET BY MOUTH IN THE MORNING 90 tablet 1   LevOCARNitine  (L-CARNITINE) 500 MG TABS Take 500 mg by mouth 2 (two) times daily.     losartan  (COZAAR ) 50 MG tablet Take 50 mg by mouth daily.     magnesium  oxide (MAG-OX) 400 (240 Mg) MG tablet Take 400 mg by mouth daily.     multivitamin-lutein (OCUVITE-LUTEIN) CAPS capsule Take 1 capsule by mouth daily.     pramipexole  (MIRAPEX ) 0.5 MG tablet Take 1 tablet (0.5 mg total) by mouth 2 (two) times daily. 180 tablet 1   rosuvastatin  (CRESTOR ) 40 MG tablet TAKE ONE TABLET BY MOUTH EVERY MORNING 90 tablet 3   tamsulosin  (FLOMAX ) 0.4 MG CAPS capsule Take 0.4 mg by mouth daily.  No current facility-administered medications for this visit.    Medication Side Effects: Other: weight gain.  Allergies:  Allergies  Allergen Reactions   Allopurinol Other (See Comments)    Made pt emotionally unstable  Other reaction(s): emotionally labile   Penicillins Hives and Other (See Comments)   Finerenone Other (See Comments)    hyperkalemia Other reaction(s): Increases  potassium levels   Penicillin G     Other reaction(s): hives   Aspirin Hives   Ciprofloxacin Rash    Other reaction(s): rash    Past Medical History:  Diagnosis Date   Allergic  rhinitis    Angio-edema    Bipolar disorder (HCC)    Bradycardia 2012    due to lithium   CKD (chronic kidney disease) stage 2, GFR 60-89 ml/min    DM2 (diabetes mellitus, type 2) (HCC)    Gout    Hypertension    Mild renal insufficiency    , with creatinine of 1.3 in 2010, was side effect of med   Obesity    ,moderate   Prostatitis    , Episodic   Stroke (HCC)    Suicide attempt (HCC) 09/20/2014   Urticaria     Family History  Problem Relation Age of Onset   Other Mother        Viral Meningitis   Mitral valve prolapse Mother    Arrhythmia Mother    Hypothyroidism Mother    Stroke Mother    Hypertension Father    Gout Father    Alzheimer's disease Father     Social History   Socioeconomic History   Marital status: Married    Spouse name: Edwin [name]   Number of children: 0   Years of education: Not on file   Highest education level: Master's degree (e.g., MA, MS, MEng, MEd, MSW, MBA)  Occupational History   Not on file  Tobacco Use   Smoking status: Never    Passive exposure: Never   Smokeless tobacco: Never  Vaping Use   Vaping status: Never Used  Substance and Sexual Activity   Alcohol use: No   Drug use: No   Sexual activity: Not on file  Other Topics Concern   Not on file  Social History Narrative   Lives alone   Left handed   Caffeine: 2 ice tea a day   Social Drivers of Health   Tobacco Use: Low Risk (11/26/2024)   Patient History    Smoking Tobacco Use: Never    Smokeless Tobacco Use: Never    Passive Exposure: Never  Financial Resource Strain: Low Risk (02/22/2022)   Overall Financial Resource Strain (CARDIA)    Difficulty of Paying Living Expenses: Not hard at all  Food Insecurity: No Food Insecurity (08/09/2023)   Hunger Vital Sign    Worried About Running Out of Food in the Last Year: Never true    Ran Out of Food in the Last Year: Never true  Transportation Needs: No Transportation Needs (08/09/2023)   PRAPARE - Scientist, Research (physical Sciences) (Medical): No    Lack of Transportation (Non-Medical): No  Physical Activity: Not on file  Stress: No Stress Concern Present (02/22/2022)   [employer] of Occupational Health - Occupational Stress Questionnaire    Feeling of Stress : Only a little  Social Connections: Unknown (04/03/2022)   Received from [facility]   Social Network    Social Network: Not on file  Intimate Partner Violence: Not At Risk (08/09/2023)  Humiliation, Afraid, Rape, and Kick questionnaire    Fear of Current or Ex-Partner: No    Emotionally Abused: No    Physically Abused: No    Sexually Abused: No  Depression (PHQ2-9): Low Risk (12/18/2023)   Depression (PHQ2-9)    PHQ-2 Score: 1  Alcohol Screen: Not on file  Housing: Medium Risk (08/09/2023)   Housing    Last Housing Risk Score: 1  Utilities: Not At Risk (08/09/2023)   AHC Utilities    Threatened with loss of utilities: No  Health Literacy: Not on file    Past Medical History, Surgical history, Social history, and Family history were reviewed and updated as appropriate.   Please see review of systems for further details on the patient's review from today.   Objective:   Physical Exam:  There were no vitals taken for this visit.  Physical Exam Constitutional:      General: He is not in acute distress.    Appearance: He is well-developed.  Musculoskeletal:        General: No deformity.  Neurological:     Mental Status: He is alert and oriented to person, place, and time.     Motor: No tremor.     Coordination: Coordination normal.     Gait: Gait normal.  Psychiatric:        Attention and Perception: Attention normal. He is attentive.        Mood and Affect: Mood is not anxious or depressed. Affect is not labile, blunt or inappropriate.        Speech: Speech is not rapid and pressured or slurred.        Behavior: Behavior normal.        Thought Content: Thought content normal. Thought content is not delusional. Thought  content does not include homicidal or suicidal ideation. Thought content does not include suicidal plan.        Cognition and Memory: Cognition normal.        Judgment: Judgment normal.     Comments: Insight is good.  Some situational anxiety managed Talkative and pleasant. Dealing with some grief issues      Lab Review:     Component Value Date/Time   NA 141 10/24/2023 0258   K 4.4 10/24/2023 0258   CL 108 10/24/2023 0258   CO2 23 10/24/2023 0258   GLUCOSE 160 (H) 10/24/2023 0258   BUN 43 (H) 10/24/2023 0258   CREATININE 1.63 (H) 10/24/2023 0258   CALCIUM  9.1 10/24/2023 0258   PROT 7.1 10/24/2023 0258   ALBUMIN 3.9 10/24/2023 0258   AST 19 10/24/2023 0258   ALT 23 10/24/2023 0258   ALKPHOS 87 10/24/2023 0258   BILITOT 0.4 10/24/2023 0258   GFRNONAA 46 (L) 10/24/2023 0258   GFRAA >60 02/04/2017 0525       Component Value Date/Time   WBC 9.8 10/24/2023 0258   RBC 4.26 10/24/2023 0258   HGB 12.9 (L) 10/24/2023 0258   HCT 39.3 10/24/2023 0258   PLT 174 10/24/2023 0258   MCV 92.3 10/24/2023 0258   MCH 30.3 10/24/2023 0258   MCHC 32.8 10/24/2023 0258   RDW 14.2 10/24/2023 0258   LYMPHSABS 1.3 10/24/2023 0258   MONOABS 0.7 10/24/2023 0258   EOSABS 0.2 10/24/2023 0258   BASOSABS 0.1 10/24/2023 0258    No results found for: POCLITH, LITHIUM   Lab Results  Component Value Date   VALPROATE 35 (L) 07/01/2022    Sleep study dx severe OSA AHI 53.   SABRA  res Assessment: Plan:    Vahe was seen today for follow-up, depression and anxiety.  Diagnoses and all orders for this visit:  Bipolar II disorder (HCC) -     pramipexole  (MIRAPEX ) 0.5 MG tablet; Take 1 tablet (0.5 mg total) by mouth 2 (two) times daily. -     divalproex  (DEPAKOTE  ER) 500 MG 24 hr tablet; Take 2 tablets (1,000 mg total) by mouth at bedtime.  PTSD (post-traumatic stress disorder) -     DULoxetine  (CYMBALTA ) 60 MG capsule; Take 2 capsules (120 mg total) by mouth daily.  Obstructive sleep  apnea   face to face time with patient .We discussed  Mood remains stable but dealing with some grief issues.    Switch from lithium to VPA in 2012 was followed up with CMP which was normal at the time except for elevated ammonia level.  Which was the reason for adding L-carnitine and this corrected the ammonia level.   Elevated liver enzymes resolved.  Having some other health issues.    Recent stressor over the customer who falsely accused him.  This is ongoing.    Good response to the med combination.  Polypharmacy necessary.   Since 2017 on pramipexole  has been much better with regard to mood stability.  He feels it causes weight gain but cannot maintain stability bc of it.  Using O2 consistently HS and he requires less sleep.  Disc sleep hygiene and getting adequate quantity for mental health.  Sleep deprivation increases risk mania.   No med changes indicated.    Sig risk in switching his primary mood stabilizer unless its absolutely nececessary. Depakote  1000, duloxetine  120, lamotrigine  200, pramipexole  0.5 mg BID.  Lyrica  100 mg BID from neuro.  Failed attempt to reduce pramipexole  below 0.5 mg BID Disc risk impulsivity and mania with this but he's had depression benefit without problems.  06/30/22 ED stroke  then had rehab.  Counseling 30 min:  re: history of mental health suffering and strong Christian faith.  Went to a church of one of the [employer] and it was healing.  It has helped him grow spiritually.  This is helping to deal with grief issues.    FU 3-4 mos  Lorene Macintosh, MD, DFAPA   Please see After Visit Summary for patient specific instructions.  Future Appointments  Date Time Provider Department Center  12/20/2024 10:40 AM [facility] MR 3 [id number]  01/05/2025  9:15 AM Silva Juliene SAUNDERS, DPM [facility]  10/18/2025  8:30 AM Sherryl Bouchard, NP [facility] None     No orders of the defined types were placed in this encounter.       -------------------------------

## 2024-12-13 ENCOUNTER — Other Ambulatory Visit: Payer: Self-pay | Admitting: Adult Health

## 2024-12-20 ENCOUNTER — Other Ambulatory Visit

## 2024-12-25 ENCOUNTER — Emergency Department (HOSPITAL_COMMUNITY)

## 2024-12-25 ENCOUNTER — Emergency Department (HOSPITAL_COMMUNITY)
Admission: EM | Admit: 2024-12-25 | Discharge: 2024-12-25 | Disposition: A | Discharge status: Home / Self Care | Source: Home / Self Care | Attending: Emergency Medicine | Admitting: Emergency Medicine

## 2024-12-25 DIAGNOSIS — N179 Acute kidney failure, unspecified: Secondary | ICD-10-CM

## 2024-12-25 DIAGNOSIS — R42 Dizziness and giddiness: Secondary | ICD-10-CM

## 2024-12-25 LAB — I-STAT CHEM 8, ED
BUN: 53 mg/dL — ABNORMAL HIGH (ref 8–23)
Calcium, Ion: 1.18 mmol/L (ref 1.15–1.40)
Chloride: 104 mmol/L (ref 98–111)
Creatinine, Ser: 2.5 mg/dL — ABNORMAL HIGH (ref 0.61–1.24)
Glucose, Bld: 146 mg/dL — ABNORMAL HIGH (ref 70–99)
HCT: 37 % — ABNORMAL LOW (ref 39.0–52.0)
Hemoglobin: 12.6 g/dL — ABNORMAL LOW (ref 13.0–17.0)
Potassium: 4.2 mmol/L (ref 3.5–5.1)
Sodium: 138 mmol/L (ref 135–145)
TCO2: 24 mmol/L (ref 22–32)

## 2024-12-25 LAB — URINE DRUG SCREEN
Amphetamines: NEGATIVE
Barbiturates: NEGATIVE
Benzodiazepines: NEGATIVE
Cocaine: NEGATIVE
Fentanyl: NEGATIVE
Methadone Scn, Ur: NEGATIVE
Opiates: NEGATIVE
Tetrahydrocannabinol: NEGATIVE

## 2024-12-25 LAB — DIFFERENTIAL
Abs Immature Granulocytes: 0.02 10*3/uL (ref 0.00–0.07)
Basophils Absolute: 0.1 10*3/uL (ref 0.0–0.1)
Basophils Relative: 1 %
Eosinophils Absolute: 0.2 10*3/uL (ref 0.0–0.5)
Eosinophils Relative: 4 %
Immature Granulocytes: 0 %
Lymphocytes Relative: 26 %
Lymphs Abs: 1.4 10*3/uL (ref 0.7–4.0)
Monocytes Absolute: 0.6 10*3/uL (ref 0.1–1.0)
Monocytes Relative: 11 %
Neutro Abs: 3.2 10*3/uL (ref 1.7–7.7)
Neutrophils Relative %: 58 %

## 2024-12-25 LAB — COMPREHENSIVE METABOLIC PANEL WITH GFR
ALT: 21 U/L (ref 0–44)
AST: 21 U/L (ref 15–41)
Albumin: 4.7 g/dL (ref 3.5–5.0)
Alkaline Phosphatase: 91 U/L (ref 38–126)
Anion gap: 15 (ref 5–15)
BUN: 51 mg/dL — ABNORMAL HIGH (ref 8–23)
CO2: 22 mmol/L (ref 22–32)
Calcium: 9.6 mg/dL (ref 8.9–10.3)
Chloride: 102 mmol/L (ref 98–111)
Creatinine, Ser: 2.37 mg/dL — ABNORMAL HIGH (ref 0.61–1.24)
GFR, Estimated: 29 mL/min — ABNORMAL LOW
Glucose, Bld: 154 mg/dL — ABNORMAL HIGH (ref 70–99)
Potassium: 4.3 mmol/L (ref 3.5–5.1)
Sodium: 139 mmol/L (ref 135–145)
Total Bilirubin: 0.3 mg/dL (ref 0.0–1.2)
Total Protein: 7.4 g/dL (ref 6.5–8.1)

## 2024-12-25 LAB — CBC
HCT: 39.6 % (ref 39.0–52.0)
Hemoglobin: 12.9 g/dL — ABNORMAL LOW (ref 13.0–17.0)
MCH: 30.6 pg (ref 26.0–34.0)
MCHC: 32.6 g/dL (ref 30.0–36.0)
MCV: 93.8 fL (ref 80.0–100.0)
Platelets: 229 10*3/uL (ref 150–400)
RBC: 4.22 MIL/uL (ref 4.22–5.81)
RDW: 12.5 % (ref 11.5–15.5)
WBC: 5.5 10*3/uL (ref 4.0–10.5)
nRBC: 0 % (ref 0.0–0.2)

## 2024-12-25 LAB — PROTIME-INR
INR: 1 (ref 0.8–1.2)
Prothrombin Time: 13.7 s (ref 11.4–15.2)

## 2024-12-25 LAB — CBG MONITORING, ED: Glucose-Capillary: 147 mg/dL — ABNORMAL HIGH (ref 70–99)

## 2024-12-25 LAB — APTT: aPTT: 27 s (ref 24–36)

## 2024-12-25 LAB — ETHANOL: Alcohol, Ethyl (B): <15 mg/dL

## 2024-12-25 MED ORDER — SODIUM CHLORIDE 0.9 % IV BOLUS
1000.0000 mL | Freq: Once | INTRAVENOUS | Status: AC
  Administered 2024-12-25: 1000 mL via INTRAVENOUS

## 2024-12-25 NOTE — ED Notes (Signed)
 Outside CT, neuro tele Dr. Deedra present on monitor

## 2024-12-25 NOTE — ED Provider Notes (Signed)
 " [facility] Provider Note  CSN: [id number] Arrival date & time: 12/25/24 0830  Chief Complaint(s) Lack of Balance   HPI Edwin [name] is a 69 y.o. male here today due to trouble with his balance.  Patient reports that he woke up and had difficulty with his balance.  He states that when he tried to sit up he fell over to his left side.  He states that he had a prior ischemic stroke on that side in 2023, takes Plavix .  Patient went to bed at 3:30 AM, this is his last known well.   Past Medical History Past Medical History:  Diagnosis Date   Allergic rhinitis    Angio-edema    Bipolar disorder (HCC)    Bradycardia 2012    due to lithium   CKD (chronic kidney disease) stage 2, GFR 60-89 ml/min    DM2 (diabetes mellitus, type 2) (HCC)    Gout    Hypertension    Mild renal insufficiency    , with creatinine of 1.3 in 2010, was side effect of med   Obesity    ,moderate   Prostatitis    , Episodic   Stroke (HCC)    Suicide attempt (HCC) 09/20/2014   Urticaria    Patient Active Problem List   Diagnosis Date Noted   DM type 2 with diabetic peripheral neuropathy (HCC) 04/29/2024   Bilateral carpal tunnel syndrome 04/29/2024   Penicillin allergy 11/06/2023   Leg swelling 11/06/2023   Medication management 11/05/2023   Cellulitis of left lower extremity 08/09/2023   Severe sepsis (HCC) 08/09/2023   Acute renal failure superimposed on stage 3b chronic kidney disease (HCC) 08/09/2023   Somnolence, daytime 05/30/2023   Nocturnal hypoxia 08/02/2022   Paresthesias 08/02/2022   Night-waking disorder with delayed sleep phase 08/02/2022   Pain in right ankle and joints of right foot 02/22/2022   Affective psychosis 02/22/2022   Allergic rhinitis 02/22/2022   Bipolar disorder (HCC) 02/22/2022   Body mass index (BMI) 37.0-37.9, adult 02/22/2022   Bradycardia 02/22/2022   Cat scratch of forearm 02/22/2022   Cerebral infarction (HCC)  02/22/2022   Closed fracture of metatarsal bone 02/22/2022   Constipation 02/22/2022   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   Dyslipidemia 02/22/2022   Edema 02/22/2022   Electrocardiogram abnormal 02/22/2022   Elevated PSA 02/22/2022   Enthesopathy 02/22/2022   Erectile dysfunction 02/22/2022   History of small bowel obstruction 02/22/2022   Hyperglycemia 02/22/2022   Hyperglycemia due to type 2 diabetes mellitus (HCC) 02/22/2022   Insomnia 02/22/2022   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   Impairment of balance 02/22/2022   Major depression, single episode 02/22/2022   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   Melena 02/22/2022   Mixed hyperlipidemia 02/22/2022   Obstructive sleep apnea syndrome 02/22/2022   Other long term (current) drug therapy 02/22/2022   Encounter for immunization 02/22/2022   Right lower quadrant pain 02/22/2022   Severe recurrent major depression without psychotic features (HCC) 02/22/2022   Umbilical hernia 02/22/2022   Vitamin D  deficiency 02/22/2022   Left-sided weakness 02/04/2022   DM2 (diabetes mellitus, type 2) (HCC) 02/04/2022   CVA (cerebral vascular accident) (HCC) 02/04/2022   Enteritis of ileum 02/02/2017   Intestinal obstruction (HCC) 02/02/2017   Persistent recurrent vomiting 02/01/2017   Chronic kidney disease, stage 3 unspecified (HCC) 05/19/2016   Abdominal pain 05/19/2016   Hypokalemia 05/19/2016   Primary gout    Bipolar affective disorder,  currently depressed, moderate (HCC) 09/20/2014   Essential hypertension 11/23/2013   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 Authorizing Provider  amLODipine  (NORVASC ) 5 MG tablet Take 5 mg by mouth daily. 05/28/23   [provider]  Cholecalciferol  1000 units TBDP Take 1 tablet by mouth daily.     [provider]  clopidogrel  (PLAVIX ) 75 MG tablet TAKE ONE  TABLET BY MOUTH ONCE DAILY 12/14/24   Millikan, Megan, NP  dapagliflozin  propanediol (FARXIGA ) 10 MG TABS tablet Take 10 mg by mouth daily.    [provider]  divalproex  (DEPAKOTE  ER) 500 MG 24 hr tablet Take 2 tablets (1,000 mg total) by mouth at bedtime. 11/26/24   Cottle, Lorene KANDICE Raddle., MD  DULoxetine  (CYMBALTA ) 60 MG capsule Take 2 capsules (120 mg total) by mouth daily. 11/26/24   Cottle, Lorene KANDICE Raddle., MD  febuxostat  (ULORIC ) 40 MG tablet Take 40 mg by mouth daily.    [provider]  furosemide  (LASIX ) 20 MG tablet Take 20 mg by mouth every other day. 07/21/19   [provider]  lamoTRIgine  (LAMICTAL ) 200 MG tablet TAKE ONE TABLET BY MOUTH IN THE MORNING 09/20/24   Cottle, Lorene KANDICE Raddle., MD  LevOCARNitine  (L-CARNITINE) 500 MG TABS Take 500 mg by mouth 2 (two) times daily.    [provider]  losartan  (COZAAR ) 50 MG tablet Take 50 mg by mouth daily. 02/10/24   [provider]  magnesium  oxide (MAG-OX) 400 (240 Mg) MG tablet Take 400 mg by mouth daily.    [provider]  multivitamin-lutein (OCUVITE-LUTEIN) CAPS capsule Take 1 capsule by mouth daily.    [provider]  pramipexole  (MIRAPEX ) 0.5 MG tablet Take 1 tablet (0.5 mg total) by mouth 2 (two) times daily. 11/26/24   Cottle, Lorene KANDICE Raddle., MD  rosuvastatin  (CRESTOR ) 40 MG tablet TAKE ONE TABLET BY MOUTH EVERY MORNING 05/26/24   Jeffrie Oneil BROCKS, MD  tamsulosin  (FLOMAX ) 0.4 MG CAPS capsule Take 0.4 mg by mouth daily. 06/04/22   [provider]                                                                                                                                    Past Surgical History Past Surgical History:  Procedure Laterality Date   APPENDECTOMY     CATARACT EXTRACTION Right    COLONOSCOPY WITH PROPOFOL  N/A 06/14/2014   Procedure: COLONOSCOPY WITH PROPOFOL ;  Surgeon: Gladis MARLA Louder, MD;  Location: [facility];  Service: Endoscopy;  Laterality: N/A;   TONSILLECTOMY      UMBILICAL HERNIA REPAIR     Family History Family History  Problem Relation Age of Onset   Other Mother        Viral Meningitis   Mitral valve prolapse Mother    Arrhythmia Mother    Hypothyroidism Mother    Stroke  Mother    Hypertension Father    Gout Father    Alzheimer's disease Father     Social History Social History[1] Allergies Allopurinol, Penicillins, Finerenone, Penicillin g, Aspirin, and Ciprofloxacin  Review of Systems Review of Systems  Physical Exam Vital Signs  I have reviewed the triage vital signs BP 127/77   Pulse 84   Temp 98.8 F (37.1 C) (Oral)   Resp (!) 21   SpO2 94%   Physical Exam Vitals and nursing note reviewed.  Eyes:     Pupils: Pupils are equal, round, and reactive to light.  Cardiovascular:     Rate and Rhythm: Normal rate.  Pulmonary:     Effort: Pulmonary effort is normal.  Abdominal:     General: Abdomen is flat.     Palpations: Abdomen is soft.  Musculoskeletal:     Cervical back: Normal range of motion.  Neurological:     Mental Status: He is alert.     Comments: I know x 3, cranial nerves II through XII intact.  5 out of 5 strength in bilateral upper and lower extremities.  Patient with truncal ataxia, listing to left with sitting up straight.     ED Results and Treatments Labs (all labs ordered are listed, but only abnormal results are displayed) Labs Reviewed  CBC - Abnormal; Notable for the following components:      Result Value   Hemoglobin 12.9 (*)    All other components within normal limits  COMPREHENSIVE METABOLIC PANEL WITH GFR - Abnormal; Notable for the following components:   Glucose, Bld 154 (*)    BUN 51 (*)    Creatinine, Ser 2.37 (*)    GFR, Estimated 29 (*)    All other components within normal limits  I-STAT CHEM 8, ED - Abnormal; Notable for the following components:   BUN 53 (*)    Creatinine, Ser 2.50 (*)    Glucose, Bld 146 (*)    Hemoglobin 12.6 (*)    HCT 37.0 (*)    All other  components within normal limits  CBG MONITORING, ED - Abnormal; Notable for the following components:   Glucose-Capillary 147 (*)    All other components within normal limits  PROTIME-INR  APTT  DIFFERENTIAL  ETHANOL  URINE DRUG SCREEN                                                                                                                          Radiology MR ANGIO NECK WO CONTRAST Result Date: 12/25/2024 CLINICAL DATA:  Neuro deficit, acute stroke suspected EXAM: MRA NECK WITHOUT CONTRAST TECHNIQUE: Angiographic images of the neck were acquired using MRA technique without intravenous contrast. Carotid stenosis measurements (when applicable) are obtained utilizing NASCET criteria, using the distal internal carotid diameter as the denominator. COMPARISON:  None Available. FINDINGS: Right carotid system: There is motion artifact which obscures the internal carotid artery just above the carotid bulb. The carotid bulb appears normal. The internal carotid  artery is patent. Left carotid system: Normal Vertebral arteries: Both vertebral arteries are patent with antegrade flow. Left side dominant. Other: None. IMPRESSION: 1. Normal left carotid artery 2. There is motion artifact which obscures the right internal carotid artery just above the carotid bulb. This is most likely normal. If there is clinical concern for right carotid lesion, ultrasound recommended 3. Both vertebral arteries are patent Electronically Signed   By: Nancyann Burns M.D.   On: 12/25/2024 10:15   MR BRAIN WO CONTRAST Result Date: 12/25/2024 CLINICAL DATA:  Neuro deficit, acute stroke suspected EXAM: MRI HEAD WITHOUT CONTRAST TECHNIQUE: Multiplanar, multiecho pulse sequences of the brain and surrounding structures were obtained without intravenous contrast. COMPARISON:  None Available. FINDINGS: MRI brain: The brain volume is normal. There are a few small foci of T2 hyperintensity in the cerebral white matter. These do not have  restricted diffusion. There is no acute or chronic infarct. The ventricles are normal. No mass lesion. There are normal flow signals in the carotid arteries and basilar artery. No significant bone marrow signal abnormality. No significant abnormality in the paranasal sinuses or soft tissues. IMPRESSION: No acute infarct or other significant abnormality Electronically Signed   By: Nancyann Burns M.D.   On: 12/25/2024 10:12   MR ANGIO HEAD WO CONTRAST Result Date: 12/25/2024 CLINICAL DATA:  Neuro deficit, acute stroke suspected EXAM: MRA HEAD WITHOUT CONTRAST TECHNIQUE: Angiographic images of the Circle of Willis were acquired using MRA technique without intravenous contrast. COMPARISON:  None Available. FINDINGS: MRA intracranial: Right-side: The internal carotid artery is patent without significant stenosis. The middle cerebral artery is patent without significant stenosis or proximal branch occlusion. The anterior cerebral artery is patent without significant stenosis or proximal branch occlusion. No aneurysm. Left side: The internal carotid artery is patent without significant stenosis. The middle cerebral artery is patent without significant stenosis or proximal branch occlusion. The anterior cerebral artery is patent without significant stenosis or proximal branch occlusion. No aneurysm. Posterior circulation: Both vertebral arteries are patent. The basilar artery is patent without significant stenosis. Both posterior cerebral arteries are patent. No aneurysm. Other comments: None IMPRESSION: Normal Electronically Signed   By: Nancyann Burns M.D.   On: 12/25/2024 10:10   CT HEAD CODE STROKE WO CONTRAST (LKW 0-4.5h, LVO 0-24h) Result Date: 12/25/2024 EXAM: CT HEAD WITHOUT CONTRAST 12/25/2024 08:56:39 AM TECHNIQUE: CT of the head was performed without the administration of intravenous contrast. Automated exposure control, iterative reconstruction, and/or weight based adjustment of the mA/kV was utilized to reduce  the radiation dose to as low as reasonably achievable. COMPARISON: 09/23/2023. CLINICAL HISTORY: Acute neurologic deficit; stroke suspected. FINDINGS: BRAIN AND VENTRICLES: No acute hemorrhage. No evidence of acute infarct. No hydrocephalus. No extra-axial collection. No mass effect or midline shift. Unchanged parietal predominant cerebral volume loss. [place] Stroke Program Early CT Score (ASPECTS) ----- Ganglionic (caudate, IC, lentiform nucleus, insula, M1-M3): 7 Supraganglionic (M4-M6): 3 Total: 10 ORBITS: Bilateral lens replacement. SINUSES: No acute abnormality. SOFT TISSUES AND SKULL: No acute soft tissue abnormality. No skull fracture. IMPRESSION: 1. No acute intracranial abnormality. ASPECTS 10. 2. These results were communicated to Dr. Arora at 9:05 AM on 12/25/24 by secure text page via the [facility] messaging system. Electronically signed by: Ryan Chess MD 12/25/2024 09:06 AM EST RP Workstation: [id number]    Pertinent labs & imaging results that were available during my care of the patient were reviewed by me and considered in my medical decision making (see MDM for details).  Medications Ordered  in ED Medications  sodium chloride  0.9 % bolus 1,000 mL (1,000 mLs Intravenous New Bag/Given 12/25/24 1123)                                                                                                                                     Procedures .Critical Care  Performed by: Mannie Fairy DASEN, DO Authorized by: Mannie Fairy DASEN, DO   Critical care provider statement:    Critical care time (minutes):  33   Critical care was necessary to treat or prevent imminent or life-threatening deterioration of the following conditions:  CNS failure or compromise   (including critical care time)  Medical Decision Making / ED Course   This patient presents to the ED for concern of loss of balance, this involves an extensive number of treatment options, and is a complaint that carries with it a high  risk of complications and morbidity.  The differential diagnosis includes CVA, vertigo, dehydration.  MDM: On exam, patient does have some truncal ataxia, given his history of prior CVA, did activate patient as a code stroke.  His last known well was 3:30 AM, so he is outside of the window for thrombolytics, however there is a slim potential the patient may be an IR candidate if there is a thrombus.  Patient currently taking Plavix .  Compliant with all her medications.  Reassessment 11:30 AM-patient negative for acute stroke on CT imaging and MRI.  Does have a mild AKI.  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  Patient assures me he will drink plenty of water today and follow-up with his regular doctor.  I believe this is reasonable.  Will discharge.   Additional history obtained: -Additional history obtained from  -External records from outside source obtained and reviewed including: Chart review including previous PCP note.  Lab Tests: -I ordered, reviewed, and interpreted labs.   The pertinent results include:   Labs Reviewed  CBC - Abnormal; Notable for the following components:      Result Value   Hemoglobin 12.9 (*)    All other components within normal limits  COMPREHENSIVE METABOLIC PANEL WITH GFR - Abnormal; Notable for the following components:   Glucose, Bld 154 (*)    BUN 51 (*)    Creatinine, Ser 2.37 (*)    GFR, Estimated 29 (*)    All other components within normal limits  I-STAT CHEM 8, ED - Abnormal; Notable for the following components:   BUN 53 (*)    Creatinine, Ser 2.50 (*)    Glucose, Bld 146 (*)    Hemoglobin 12.6 (*)    HCT 37.0 (*)    All other components within normal limits  CBG MONITORING, ED - Abnormal; Notable for the following  components:   Glucose-Capillary 147 (*)    All  other components within normal limits  PROTIME-INR  APTT  DIFFERENTIAL  ETHANOL  URINE DRUG SCREEN      EKG my independent review of the patient's EKG shows no ST segment depressions or elevations, no T wave inversions, no evidence of acute ischemia.  EKG Interpretation Date/Time:  Friday December 25 2024 10:29:50 EST Ventricular Rate:  80 PR Interval:  215 QRS Duration:  121 QT Interval:  395 QTC Calculation: 456 R Axis:   54  Text Interpretation: Sinus rhythm Borderline prolonged PR interval Right bundle branch block Confirmed by Mannie Pac [phone]) on 12/25/2024 11:37:54 AM         Imaging Studies ordered: I ordered imaging studies including CT head, MRI I independently visualized and interpreted imaging. I agree with the radiologist interpretation   Medicines ordered and prescription drug management: Meds ordered this encounter  Medications   sodium chloride  0.9 % bolus 1,000 mL    -I have reviewed the patients home medicines and have made adjustments as needed  Critical interventions Of code stroke activation   Cardiac Monitoring: The patient was maintained on a cardiac monitor.  I personally viewed and interpreted the cardiac monitored which showed an underlying rhythm of: Normal sinus rhythm  Social Determinants of Health:  Factors impacting patients care include: Lack of access to primary care   Reevaluation: After the interventions noted above, I reevaluated the patient and found that they have :improved  Co morbidities that complicate the patient evaluation  Past Medical History:  Diagnosis Date   Allergic rhinitis    Angio-edema    Bipolar disorder (HCC)    Bradycardia 2012    due to lithium   CKD (chronic kidney disease) stage 2, GFR 60-89 ml/min    DM2 (diabetes mellitus, type 2) (HCC)    Gout    Hypertension    Mild renal insufficiency    , with creatinine of 1.3 in 2010, was side effect of med   Obesity    ,moderate   Prostatitis     , Episodic   Stroke (HCC)    Suicide attempt (HCC) 09/20/2014   Urticaria       Dispostion: I considered admission for this patient, however given his reassuring workup he is appropriate for outpatient follow-up.     Final Clinical Impression(s) / ED Diagnoses Final diagnoses:  Dizziness  AKI (acute kidney injury)     @PCDICTATION @     [1]  Social History Tobacco Use   Smoking status: Never    Passive exposure: Never   Smokeless tobacco: Never  Vaping Use   Vaping status: Never Used  Substance Use Topics   Alcohol use: No   Drug use: No     Mannie Pac T, DO 12/25/24 1140  "

## 2024-12-25 NOTE — ED Notes (Signed)
 Attempted 2nd IV (18g) in L AC and FA, attempted for perfusion, unsuccessful, cancelled d/t creatinine.

## 2024-12-25 NOTE — Discharge Instructions (Signed)
 While you were in the emergency room, you had imaging done that did not show a new stroke.  You were little bit dehydrated, make sure that you are drinking plenty of water over the next few days.  Please follow-up with your regular doctor for a follow-up appointment.  Return to the emergency room for any new or worsening symptoms.

## 2024-12-25 NOTE — ED Notes (Signed)
 From room 14 to CT, tele neuro call initiated for concern for posterior stroke and possible IR tx. LKN at bedtime of 0300, ataxia and dizziness noted upon waking at 0800. VAN (-) per EDP Dr. Mannie.

## 2024-12-25 NOTE — ED Notes (Signed)
 In CT, neuro exam done, elevated creatinine, angio perfusion cancelled, to MRI at this time.

## 2024-12-25 NOTE — ED Triage Notes (Addendum)
 Pt reports went to bed fine, woke up at 0803 with lack of balance, leans to left side , hx of the same in prior stroke , pt a/o x 4. MD in room , code stroke called at 0845, LKWT : 0300 12/25/24

## 2024-12-25 NOTE — ED Notes (Signed)
 No changes. Into MRI scanner. Denies pain, remains alert, NAD, calm, interactive, cooperative, answers questions, follows commands, MAEx4, speech clear.

## 2024-12-25 NOTE — ED Notes (Signed)
 Back to room 14 from MRI, pending results. Neuro MD notified. No changes. Family at [facility].

## 2024-12-25 NOTE — ED Notes (Addendum)
 No changes. Denies sx or complaints, questions or needs, VSS. Out with family.

## 2024-12-25 NOTE — Consult Note (Signed)
 [facility] Telemedicine Consult   Requesting Provider: Dr. Mannie Consult Participants: Dr. RONAL [name], bedside RN-John Location of the provider: [facility] Location of the patient: [facility]  This consult was provided via telemedicine with 2-way video and audio communication. The patient/family was informed that care would be provided in this way and agreed to receive care in this manner.   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  Last known well was when he went to bed at 3:30 AM.  Upon waking up around 8 AM, he noticed that he was having difficulty with his gait and balance.  These are the similar symptoms that he had when he had the stroke in his brainstem a few years ago.  Came into the emergency department where he was noted to have some truncal ataxia but otherwise no focal deficits.  He was not in the window for thrombolytics but code stroke was activated out of abundance of caution due to prior similar presentation with a posterior circulation stroke.   Past Medical History:  Diagnosis Date   Allergic rhinitis    Angio-edema    Bipolar disorder (HCC)    Bradycardia 2012    due to lithium   CKD (chronic kidney disease) stage 2, GFR 60-89 ml/min    DM2 (diabetes mellitus, type 2) (HCC)    Gout    Hypertension    Mild renal insufficiency    , with creatinine of 1.3 in 2010, was side effect of med   Obesity    ,moderate   Prostatitis    , Episodic   Stroke (HCC)    Suicide attempt (HCC) 09/20/2014   Urticaria     Current Medications[1]    LKW: 0330 IV thrombolysis given?: No, OSW IR Thrombectomy? No, no evidence of ELVO Modified Rankin Scale: 1-No significant post stroke disability and can perform usual  duties with stroke symptoms Time of teleneurologist evaluation: 0844 Page [phone]  Exam: Vitals:   12/25/24 0834  BP: (!) 119/49  Pulse: 98  Resp: 18  Temp: 98 F (36.7 C)  SpO2: 100%    General: Awake alert in no distress, well-developed well-nourished man Neurological exam Awake alert oriented x 3 No dysarthria No aphasia Cranial nerve examination-2-12 intact Motor examination with no drift Sensory examination normal Coordination no ataxia in the upper or lower extremities NIH stroke scale 0  Imaging Reviewed:  CT head: No acute findings.  Aspects 10.  No bleed. CT angiography head and neck and CT perfusion-not performed due to deranged renal function Stat MRI brain, MR angiography head and MR angiography neck-all without contrast ordered.  Negative for acute process.  Labs reviewed in epic and pertinent values follow: CBC    Component Value Date/Time   WBC 9.8 10/24/2023 0258   RBC 4.26 10/24/2023 0258   HGB 12.9 (L) 10/24/2023 0258   HCT 39.3 10/24/2023 0258   PLT 174 10/24/2023 0258   MCV 92.3 10/24/2023 0258   MCH 30.3 10/24/2023 0258   MCHC 32.8 10/24/2023 0258   RDW 14.2 10/24/2023 0258   LYMPHSABS 1.3 10/24/2023 0258   MONOABS 0.7 10/24/2023 0258   EOSABS 0.2 10/24/2023 0258   BASOSABS 0.1 10/24/2023 0258   CMP     Component Value Date/Time   NA 141 10/24/2023 0258   K  4.4 10/24/2023 0258   CL 108 10/24/2023 0258   CO2 23 10/24/2023 0258   GLUCOSE 160 (H) 10/24/2023 0258   BUN 43 (H) 10/24/2023 0258   CREATININE 1.63 (H) 10/24/2023 0258   CALCIUM  9.1 10/24/2023 0258   PROT 7.1 10/24/2023 0258   ALBUMIN 3.9 10/24/2023 0258   AST 19 10/24/2023 0258   ALT 23 10/24/2023 0258   ALKPHOS 87 10/24/2023 0258   BILITOT 0.4 10/24/2023 0258   GFRNONAA 46 (L) 10/24/2023 0258   GFRAA >60 02/04/2017 0525     Assessment: 69 year old with prior brainstem infarct amongst other comorbidities presents for evaluation of sudden onset of gait difficulty noticed  upon waking up this morning.  Last known well was 3:30 AM when he went to bed. Outside the window for IV thrombolytics NIH stroke scale is 0 based on the camera exam on the bed but the ED provider reported some truncal ataxia. He is at high risk for having strokes, so I would like to get some further imaging. CT angiography head and neck to look for posterior circulation occlusion or stenosis was not performed emergently due to his severely deranged renal function and somewhat low suspicion for an intervenable LVO. Instead, a stat MRI brain, MR angio head and MRI angio neck-all without contrast-were ordered.  Impression: Strokelike symptoms-evaluate for posterior circulation stroke  Recommendations:  Stat imaging as above Will follow after stat imaging with further recommendations Preliminary plan relayed to Dr. Mannie via secure chat.   Addendum MR brain and MR angiography of head and neck without contrast performed and reviewed personally No evidence of acute stroke, no evidence of ELVO Likely stroke recrudescence.  Evaluate for underlying infection or metabolic derangements and treat per primary team Follow-up with outpatient neurology   -- Edwin Lav, MD Neurologist [facility] Pager: [phone]  ;     [1] No current facility-administered medications for this encounter.  Current Outpatient Medications:    amLODipine  (NORVASC ) 5 MG tablet, Take 5 mg by mouth daily., Disp: , Rfl:    Cholecalciferol  1000 units TBDP, Take 1 tablet by mouth daily. , Disp: , Rfl:    clopidogrel  (PLAVIX ) 75 MG tablet, TAKE ONE TABLET BY MOUTH ONCE DAILY, Disp: 90 tablet, Rfl: 1   dapagliflozin  propanediol (FARXIGA ) 10 MG TABS tablet, Take 10 mg by mouth daily., Disp: , Rfl:    divalproex  (DEPAKOTE  ER) 500 MG 24 hr tablet, Take 2 tablets (1,000 mg total) by mouth at bedtime., Disp: 180 tablet, Rfl: 1   DULoxetine  (CYMBALTA ) 60 MG capsule, Take 2 capsules (120 mg total) by mouth  daily., Disp: 180 capsule, Rfl: 1   febuxostat  (ULORIC ) 40 MG tablet, Take 40 mg by mouth daily., Disp: , Rfl:    furosemide  (LASIX ) 20 MG tablet, Take 20 mg by mouth every other day., Disp: , Rfl:    lamoTRIgine  (LAMICTAL ) 200 MG tablet, TAKE ONE TABLET BY MOUTH IN THE MORNING, Disp: 90 tablet, Rfl: 1   LevOCARNitine  (L-CARNITINE) 500 MG TABS, Take 500 mg by mouth 2 (two) times daily., Disp: , Rfl:    losartan  (COZAAR ) 50 MG tablet, Take 50 mg by mouth daily., Disp: , Rfl:    magnesium  oxide (MAG-OX) 400 (240 Mg) MG tablet, Take 400 mg by mouth daily., Disp: , Rfl:    multivitamin-lutein (OCUVITE-LUTEIN) CAPS capsule, Take 1 capsule by mouth daily., Disp: , Rfl:    pramipexole  (MIRAPEX ) 0.5 MG tablet, Take 1 tablet (0.5 mg total) by mouth 2 (two) times daily., Disp:  180 tablet, Rfl: 1   rosuvastatin  (CRESTOR ) 40 MG tablet, TAKE ONE TABLET BY MOUTH EVERY MORNING, Disp: 90 tablet, Rfl: 3   tamsulosin  (FLOMAX ) 0.4 MG CAPS capsule, Take 0.4 mg by mouth daily., Disp: , Rfl:

## 2024-12-25 NOTE — ED Notes (Addendum)
 No changes, MRI complete.

## 2024-12-25 NOTE — ED Notes (Signed)
 Into CT, delay d/t getting other pt off CT table

## 2025-01-05 ENCOUNTER — Ambulatory Visit: Admitting: Podiatry

## 2025-03-30 ENCOUNTER — Ambulatory Visit: Admitting: Psychiatry

## 2025-10-18 ENCOUNTER — Ambulatory Visit: Admitting: Adult Health
